# Patient Record
Sex: Female | Born: 1940 | Race: White | Hispanic: No | Marital: Married | Smoking: Former smoker
Health system: Southern US, Community
[De-identification: ages and names within clinical notes are randomized; demographics above are authoritative.]

## PROBLEM LIST (undated history)

## (undated) DIAGNOSIS — R413 Other amnesia: Secondary | ICD-10-CM

## (undated) DIAGNOSIS — F32A Depression, unspecified: Secondary | ICD-10-CM

## (undated) DIAGNOSIS — Z9889 Other specified postprocedural states: Secondary | ICD-10-CM

## (undated) DIAGNOSIS — R51 Headache: Secondary | ICD-10-CM

## (undated) DIAGNOSIS — M2550 Pain in unspecified joint: Secondary | ICD-10-CM

## (undated) DIAGNOSIS — C189 Malignant neoplasm of colon, unspecified: Secondary | ICD-10-CM

## (undated) DIAGNOSIS — K219 Gastro-esophageal reflux disease without esophagitis: Secondary | ICD-10-CM

## (undated) DIAGNOSIS — Z9289 Personal history of other medical treatment: Secondary | ICD-10-CM

## (undated) DIAGNOSIS — Z86718 Personal history of other venous thrombosis and embolism: Secondary | ICD-10-CM

## (undated) DIAGNOSIS — F329 Major depressive disorder, single episode, unspecified: Secondary | ICD-10-CM

## (undated) DIAGNOSIS — Z860100 Personal history of colon polyps, unspecified: Secondary | ICD-10-CM

## (undated) DIAGNOSIS — I10 Essential (primary) hypertension: Secondary | ICD-10-CM

## (undated) DIAGNOSIS — Z87442 Personal history of urinary calculi: Secondary | ICD-10-CM

## (undated) DIAGNOSIS — K649 Unspecified hemorrhoids: Secondary | ICD-10-CM

## (undated) DIAGNOSIS — H269 Unspecified cataract: Secondary | ICD-10-CM

## (undated) DIAGNOSIS — M1990 Unspecified osteoarthritis, unspecified site: Secondary | ICD-10-CM

## (undated) DIAGNOSIS — D696 Thrombocytopenia, unspecified: Secondary | ICD-10-CM

## (undated) DIAGNOSIS — C679 Malignant neoplasm of bladder, unspecified: Secondary | ICD-10-CM

## (undated) DIAGNOSIS — D509 Iron deficiency anemia, unspecified: Secondary | ICD-10-CM

## (undated) DIAGNOSIS — Z8719 Personal history of other diseases of the digestive system: Secondary | ICD-10-CM

## (undated) DIAGNOSIS — K7460 Unspecified cirrhosis of liver: Secondary | ICD-10-CM

## (undated) HISTORY — PX: BLADDER SURGERY: SHX569

## (undated) HISTORY — DX: Essential (primary) hypertension: I10

## (undated) HISTORY — DX: Other amnesia: R41.3

## (undated) HISTORY — DX: Major depressive disorder, single episode, unspecified: F32.9

## (undated) HISTORY — DX: Unspecified cirrhosis of liver: K74.60

## (undated) HISTORY — DX: Iron deficiency anemia, unspecified: D50.9

## (undated) HISTORY — PX: ABDOMINAL HYSTERECTOMY: SHX81

## (undated) HISTORY — DX: Malignant neoplasm of colon, unspecified: C18.9

## (undated) HISTORY — DX: Malignant neoplasm of bladder, unspecified: C67.9

## (undated) HISTORY — PX: COLONOSCOPY: SHX174

## (undated) HISTORY — DX: Depression, unspecified: F32.A

## (undated) HISTORY — PX: APPENDECTOMY: SHX54

## (undated) HISTORY — DX: Thrombocytopenia, unspecified: D69.6

## (undated) HISTORY — PX: COLON RESECTION: SHX5231

## (undated) HISTORY — PX: CHOLECYSTECTOMY: SHX55

## (undated) HISTORY — DX: Unspecified osteoarthritis, unspecified site: M19.90

---

## 1998-06-28 ENCOUNTER — Encounter: Payer: Self-pay | Admitting: Emergency Medicine

## 1998-06-28 ENCOUNTER — Emergency Department (HOSPITAL_COMMUNITY): Admission: EM | Admit: 1998-06-28 | Discharge: 1998-06-28 | Payer: Self-pay | Admitting: Emergency Medicine

## 1998-07-03 ENCOUNTER — Ambulatory Visit (HOSPITAL_COMMUNITY): Admission: RE | Admit: 1998-07-03 | Discharge: 1998-07-03 | Payer: Self-pay | Admitting: General Surgery

## 1998-07-03 ENCOUNTER — Encounter: Payer: Self-pay | Admitting: General Surgery

## 1998-07-13 ENCOUNTER — Other Ambulatory Visit: Admission: RE | Admit: 1998-07-13 | Discharge: 1998-07-13 | Payer: Self-pay

## 1998-07-13 ENCOUNTER — Encounter: Payer: Self-pay | Admitting: General Surgery

## 1998-07-18 ENCOUNTER — Ambulatory Visit (HOSPITAL_COMMUNITY): Admission: RE | Admit: 1998-07-18 | Discharge: 1998-07-19 | Payer: Self-pay | Admitting: General Surgery

## 1999-09-23 DIAGNOSIS — C189 Malignant neoplasm of colon, unspecified: Secondary | ICD-10-CM

## 1999-09-23 HISTORY — PX: COLON SURGERY: SHX602

## 1999-09-23 HISTORY — DX: Malignant neoplasm of colon, unspecified: C18.9

## 1999-10-30 ENCOUNTER — Encounter: Payer: Self-pay | Admitting: Internal Medicine

## 1999-10-30 ENCOUNTER — Ambulatory Visit (HOSPITAL_COMMUNITY): Admission: RE | Admit: 1999-10-30 | Discharge: 1999-10-30 | Payer: Self-pay | Admitting: Internal Medicine

## 2000-01-08 ENCOUNTER — Ambulatory Visit (HOSPITAL_COMMUNITY): Admission: RE | Admit: 2000-01-08 | Discharge: 2000-01-08 | Payer: Self-pay | Admitting: Internal Medicine

## 2000-01-08 ENCOUNTER — Encounter: Payer: Self-pay | Admitting: Internal Medicine

## 2000-06-04 ENCOUNTER — Ambulatory Visit (HOSPITAL_COMMUNITY): Admission: RE | Admit: 2000-06-04 | Discharge: 2000-06-04 | Payer: Self-pay | Admitting: Gastroenterology

## 2000-06-04 ENCOUNTER — Encounter (INDEPENDENT_AMBULATORY_CARE_PROVIDER_SITE_OTHER): Payer: Self-pay | Admitting: *Deleted

## 2000-06-05 ENCOUNTER — Encounter: Payer: Self-pay | Admitting: Gastroenterology

## 2000-06-05 ENCOUNTER — Ambulatory Visit (HOSPITAL_COMMUNITY): Admission: RE | Admit: 2000-06-05 | Discharge: 2000-06-05 | Payer: Self-pay | Admitting: Gastroenterology

## 2000-06-10 ENCOUNTER — Encounter (INDEPENDENT_AMBULATORY_CARE_PROVIDER_SITE_OTHER): Payer: Self-pay | Admitting: Specialist

## 2000-06-10 ENCOUNTER — Ambulatory Visit (HOSPITAL_COMMUNITY): Admission: RE | Admit: 2000-06-10 | Discharge: 2000-06-10 | Payer: Self-pay | Admitting: Gastroenterology

## 2000-06-26 ENCOUNTER — Inpatient Hospital Stay (HOSPITAL_COMMUNITY): Admission: RE | Admit: 2000-06-26 | Discharge: 2000-07-23 | Payer: Self-pay | Admitting: General Surgery

## 2000-06-26 ENCOUNTER — Encounter (INDEPENDENT_AMBULATORY_CARE_PROVIDER_SITE_OTHER): Payer: Self-pay | Admitting: *Deleted

## 2000-07-05 ENCOUNTER — Encounter: Payer: Self-pay | Admitting: General Surgery

## 2000-07-15 ENCOUNTER — Encounter: Payer: Self-pay | Admitting: General Surgery

## 2000-07-16 ENCOUNTER — Encounter: Payer: Self-pay | Admitting: General Surgery

## 2000-07-17 ENCOUNTER — Encounter: Payer: Self-pay | Admitting: General Surgery

## 2000-07-19 ENCOUNTER — Encounter: Payer: Self-pay | Admitting: General Surgery

## 2000-09-02 ENCOUNTER — Encounter: Payer: Self-pay | Admitting: Internal Medicine

## 2000-09-02 ENCOUNTER — Ambulatory Visit (HOSPITAL_COMMUNITY): Admission: RE | Admit: 2000-09-02 | Discharge: 2000-09-02 | Payer: Self-pay | Admitting: Internal Medicine

## 2000-09-04 ENCOUNTER — Ambulatory Visit (HOSPITAL_COMMUNITY): Admission: RE | Admit: 2000-09-04 | Discharge: 2000-09-04 | Payer: Self-pay | Admitting: Internal Medicine

## 2000-09-04 ENCOUNTER — Encounter: Payer: Self-pay | Admitting: Internal Medicine

## 2000-10-13 ENCOUNTER — Encounter: Payer: Self-pay | Admitting: Internal Medicine

## 2000-10-13 ENCOUNTER — Ambulatory Visit: Admission: RE | Admit: 2000-10-13 | Discharge: 2000-10-13 | Payer: Self-pay | Admitting: Internal Medicine

## 2001-09-22 DIAGNOSIS — C679 Malignant neoplasm of bladder, unspecified: Secondary | ICD-10-CM

## 2001-09-22 HISTORY — DX: Malignant neoplasm of bladder, unspecified: C67.9

## 2002-06-07 ENCOUNTER — Ambulatory Visit (HOSPITAL_COMMUNITY): Admission: RE | Admit: 2002-06-07 | Discharge: 2002-06-07 | Payer: Self-pay | Admitting: Internal Medicine

## 2002-06-07 ENCOUNTER — Encounter: Payer: Self-pay | Admitting: Internal Medicine

## 2002-07-04 ENCOUNTER — Other Ambulatory Visit: Admission: RE | Admit: 2002-07-04 | Discharge: 2002-07-04 | Payer: Self-pay | Admitting: Obstetrics and Gynecology

## 2002-07-21 ENCOUNTER — Encounter: Payer: Self-pay | Admitting: Urology

## 2002-07-21 ENCOUNTER — Encounter (INDEPENDENT_AMBULATORY_CARE_PROVIDER_SITE_OTHER): Payer: Self-pay | Admitting: *Deleted

## 2002-07-21 ENCOUNTER — Observation Stay (HOSPITAL_COMMUNITY): Admission: RE | Admit: 2002-07-21 | Discharge: 2002-07-22 | Payer: Self-pay | Admitting: Urology

## 2002-07-26 ENCOUNTER — Encounter: Admission: RE | Admit: 2002-07-26 | Discharge: 2002-07-26 | Payer: Self-pay | Admitting: Urology

## 2002-07-26 ENCOUNTER — Encounter: Payer: Self-pay | Admitting: Urology

## 2002-08-16 ENCOUNTER — Ambulatory Visit (HOSPITAL_COMMUNITY): Admission: RE | Admit: 2002-08-16 | Discharge: 2002-08-16 | Payer: Self-pay | Admitting: Internal Medicine

## 2002-08-22 ENCOUNTER — Encounter: Payer: Self-pay | Admitting: Urology

## 2002-08-22 ENCOUNTER — Inpatient Hospital Stay (HOSPITAL_COMMUNITY): Admission: RE | Admit: 2002-08-22 | Discharge: 2002-09-01 | Payer: Self-pay | Admitting: Surgery

## 2002-08-22 ENCOUNTER — Encounter (INDEPENDENT_AMBULATORY_CARE_PROVIDER_SITE_OTHER): Payer: Self-pay | Admitting: Specialist

## 2002-08-24 ENCOUNTER — Encounter: Payer: Self-pay | Admitting: Internal Medicine

## 2002-09-02 ENCOUNTER — Encounter: Payer: Self-pay | Admitting: Urology

## 2002-09-02 ENCOUNTER — Inpatient Hospital Stay (HOSPITAL_COMMUNITY): Admission: EM | Admit: 2002-09-02 | Discharge: 2002-09-08 | Payer: Self-pay | Admitting: Emergency Medicine

## 2002-09-04 ENCOUNTER — Encounter: Payer: Self-pay | Admitting: General Surgery

## 2002-09-22 HISTORY — PX: COLON SURGERY: SHX602

## 2002-10-10 ENCOUNTER — Ambulatory Visit (HOSPITAL_BASED_OUTPATIENT_CLINIC_OR_DEPARTMENT_OTHER): Admission: RE | Admit: 2002-10-10 | Discharge: 2002-10-10 | Payer: Self-pay | Admitting: Surgery

## 2002-10-10 ENCOUNTER — Encounter: Payer: Self-pay | Admitting: Surgery

## 2002-12-20 ENCOUNTER — Inpatient Hospital Stay (HOSPITAL_COMMUNITY): Admission: EM | Admit: 2002-12-20 | Discharge: 2002-12-23 | Payer: Self-pay | Admitting: Hematology & Oncology

## 2003-01-12 ENCOUNTER — Ambulatory Visit (HOSPITAL_COMMUNITY): Admission: RE | Admit: 2003-01-12 | Discharge: 2003-01-12 | Payer: Self-pay | Admitting: Hematology & Oncology

## 2003-01-12 ENCOUNTER — Encounter: Payer: Self-pay | Admitting: Hematology & Oncology

## 2003-01-27 ENCOUNTER — Ambulatory Visit (HOSPITAL_BASED_OUTPATIENT_CLINIC_OR_DEPARTMENT_OTHER): Admission: RE | Admit: 2003-01-27 | Discharge: 2003-01-27 | Payer: Self-pay | Admitting: Surgery

## 2003-03-07 ENCOUNTER — Ambulatory Visit (HOSPITAL_COMMUNITY): Admission: RE | Admit: 2003-03-07 | Discharge: 2003-03-07 | Payer: Self-pay | Admitting: Hematology & Oncology

## 2003-03-07 ENCOUNTER — Encounter: Payer: Self-pay | Admitting: Hematology & Oncology

## 2003-05-02 ENCOUNTER — Encounter: Payer: Self-pay | Admitting: Hematology & Oncology

## 2003-05-02 ENCOUNTER — Ambulatory Visit (HOSPITAL_COMMUNITY): Admission: RE | Admit: 2003-05-02 | Discharge: 2003-05-02 | Payer: Self-pay | Admitting: Hematology & Oncology

## 2003-07-05 ENCOUNTER — Ambulatory Visit (HOSPITAL_COMMUNITY): Admission: RE | Admit: 2003-07-05 | Discharge: 2003-07-05 | Payer: Self-pay | Admitting: Hematology & Oncology

## 2003-07-05 ENCOUNTER — Encounter: Payer: Self-pay | Admitting: Hematology & Oncology

## 2003-09-06 ENCOUNTER — Ambulatory Visit (HOSPITAL_COMMUNITY): Admission: RE | Admit: 2003-09-06 | Discharge: 2003-09-06 | Payer: Self-pay | Admitting: Hematology & Oncology

## 2003-11-01 ENCOUNTER — Ambulatory Visit (HOSPITAL_COMMUNITY): Admission: RE | Admit: 2003-11-01 | Discharge: 2003-11-01 | Payer: Self-pay | Admitting: Internal Medicine

## 2003-12-18 ENCOUNTER — Ambulatory Visit (HOSPITAL_COMMUNITY): Admission: RE | Admit: 2003-12-18 | Discharge: 2003-12-18 | Payer: Self-pay | Admitting: Hematology & Oncology

## 2004-01-01 ENCOUNTER — Ambulatory Visit (HOSPITAL_COMMUNITY): Admission: RE | Admit: 2004-01-01 | Discharge: 2004-01-01 | Payer: Self-pay | Admitting: Hematology & Oncology

## 2004-01-09 ENCOUNTER — Inpatient Hospital Stay (HOSPITAL_COMMUNITY): Admission: EM | Admit: 2004-01-09 | Discharge: 2004-01-11 | Payer: Self-pay | Admitting: Emergency Medicine

## 2004-04-01 ENCOUNTER — Ambulatory Visit (HOSPITAL_COMMUNITY): Admission: RE | Admit: 2004-04-01 | Discharge: 2004-04-01 | Payer: Self-pay | Admitting: Hematology & Oncology

## 2004-05-11 ENCOUNTER — Emergency Department (HOSPITAL_COMMUNITY): Admission: EM | Admit: 2004-05-11 | Discharge: 2004-05-11 | Payer: Self-pay | Admitting: Emergency Medicine

## 2004-08-06 ENCOUNTER — Ambulatory Visit (HOSPITAL_COMMUNITY): Admission: RE | Admit: 2004-08-06 | Discharge: 2004-08-06 | Payer: Self-pay | Admitting: Hematology & Oncology

## 2004-08-14 ENCOUNTER — Ambulatory Visit (HOSPITAL_COMMUNITY): Admission: RE | Admit: 2004-08-14 | Discharge: 2004-08-14 | Payer: Self-pay | Admitting: Hematology & Oncology

## 2004-09-09 ENCOUNTER — Ambulatory Visit (HOSPITAL_COMMUNITY): Admission: RE | Admit: 2004-09-09 | Discharge: 2004-09-09 | Payer: Self-pay | Admitting: Gastroenterology

## 2004-09-30 ENCOUNTER — Encounter (INDEPENDENT_AMBULATORY_CARE_PROVIDER_SITE_OTHER): Payer: Self-pay | Admitting: *Deleted

## 2004-09-30 ENCOUNTER — Ambulatory Visit (HOSPITAL_COMMUNITY): Admission: RE | Admit: 2004-09-30 | Discharge: 2004-09-30 | Payer: Self-pay | Admitting: Gastroenterology

## 2004-10-02 ENCOUNTER — Ambulatory Visit: Payer: Self-pay | Admitting: Hematology & Oncology

## 2004-10-23 ENCOUNTER — Encounter (INDEPENDENT_AMBULATORY_CARE_PROVIDER_SITE_OTHER): Payer: Self-pay | Admitting: Specialist

## 2004-10-23 ENCOUNTER — Inpatient Hospital Stay (HOSPITAL_COMMUNITY): Admission: RE | Admit: 2004-10-23 | Discharge: 2004-11-04 | Payer: Self-pay | Admitting: Surgery

## 2004-12-26 ENCOUNTER — Ambulatory Visit: Payer: Self-pay | Admitting: Hematology & Oncology

## 2005-01-01 ENCOUNTER — Other Ambulatory Visit: Admission: RE | Admit: 2005-01-01 | Discharge: 2005-01-01 | Payer: Self-pay | Admitting: Gynecology

## 2005-02-12 ENCOUNTER — Ambulatory Visit: Payer: Self-pay | Admitting: Hematology & Oncology

## 2005-03-10 ENCOUNTER — Ambulatory Visit (HOSPITAL_COMMUNITY): Admission: RE | Admit: 2005-03-10 | Discharge: 2005-03-10 | Payer: Self-pay | Admitting: Surgery

## 2005-04-08 ENCOUNTER — Ambulatory Visit: Payer: Self-pay | Admitting: Hematology & Oncology

## 2005-05-27 ENCOUNTER — Ambulatory Visit: Payer: Self-pay | Admitting: Hematology & Oncology

## 2005-06-21 ENCOUNTER — Emergency Department (HOSPITAL_COMMUNITY): Admission: EM | Admit: 2005-06-21 | Discharge: 2005-06-21 | Payer: Self-pay | Admitting: *Deleted

## 2005-07-23 ENCOUNTER — Ambulatory Visit: Payer: Self-pay | Admitting: Hematology & Oncology

## 2005-08-22 ENCOUNTER — Ambulatory Visit (HOSPITAL_COMMUNITY): Admission: RE | Admit: 2005-08-22 | Discharge: 2005-08-22 | Payer: Self-pay | Admitting: Hematology & Oncology

## 2005-09-25 ENCOUNTER — Ambulatory Visit: Payer: Self-pay | Admitting: Hematology & Oncology

## 2005-11-28 ENCOUNTER — Ambulatory Visit: Payer: Self-pay | Admitting: Hematology & Oncology

## 2005-12-02 ENCOUNTER — Ambulatory Visit (HOSPITAL_COMMUNITY): Admission: RE | Admit: 2005-12-02 | Discharge: 2005-12-02 | Payer: Self-pay | Admitting: Hematology & Oncology

## 2006-02-15 ENCOUNTER — Ambulatory Visit: Payer: Self-pay | Admitting: Hematology & Oncology

## 2006-03-09 ENCOUNTER — Ambulatory Visit (HOSPITAL_COMMUNITY): Admission: RE | Admit: 2006-03-09 | Discharge: 2006-03-09 | Payer: Self-pay | Admitting: Hematology & Oncology

## 2006-03-16 LAB — CBC WITH DIFFERENTIAL/PLATELET
BASO%: 0.5 % (ref 0.0–2.0)
HCT: 36.3 % (ref 34.8–46.6)
LYMPH%: 30.9 % (ref 14.0–48.0)
MCH: 28.6 pg (ref 26.0–34.0)
MCHC: 33.8 g/dL (ref 32.0–36.0)
MCV: 84.7 fL (ref 81.0–101.0)
MONO%: 9.2 % (ref 0.0–13.0)
NEUT%: 57.1 % (ref 39.6–76.8)
Platelets: 115 10*3/uL — ABNORMAL LOW (ref 145–400)
RBC: 4.29 10*6/uL (ref 3.70–5.32)

## 2006-03-16 LAB — COMPREHENSIVE METABOLIC PANEL
Alkaline Phosphatase: 102 U/L (ref 39–117)
BUN: 13 mg/dL (ref 6–23)
Glucose, Bld: 100 mg/dL — ABNORMAL HIGH (ref 70–99)
Sodium: 142 mEq/L (ref 135–145)
Total Bilirubin: 1 mg/dL (ref 0.3–1.2)

## 2006-04-03 ENCOUNTER — Ambulatory Visit: Payer: Self-pay | Admitting: Hematology & Oncology

## 2006-06-08 ENCOUNTER — Ambulatory Visit: Payer: Self-pay | Admitting: Hematology & Oncology

## 2006-06-25 ENCOUNTER — Ambulatory Visit (HOSPITAL_COMMUNITY): Admission: RE | Admit: 2006-06-25 | Discharge: 2006-06-25 | Payer: Self-pay | Admitting: Hematology & Oncology

## 2006-07-22 ENCOUNTER — Ambulatory Visit: Payer: Self-pay | Admitting: Hematology & Oncology

## 2006-08-12 ENCOUNTER — Ambulatory Visit (HOSPITAL_COMMUNITY): Admission: RE | Admit: 2006-08-12 | Discharge: 2006-08-12 | Payer: Self-pay | Admitting: Gynecology

## 2006-08-18 ENCOUNTER — Other Ambulatory Visit: Admission: RE | Admit: 2006-08-18 | Discharge: 2006-08-18 | Payer: Self-pay | Admitting: Gynecology

## 2006-09-10 ENCOUNTER — Ambulatory Visit (HOSPITAL_BASED_OUTPATIENT_CLINIC_OR_DEPARTMENT_OTHER): Admission: RE | Admit: 2006-09-10 | Discharge: 2006-09-10 | Payer: Self-pay | Admitting: Surgery

## 2006-09-25 ENCOUNTER — Inpatient Hospital Stay (HOSPITAL_COMMUNITY): Admission: EM | Admit: 2006-09-25 | Discharge: 2006-09-26 | Payer: Self-pay | Admitting: Emergency Medicine

## 2006-12-02 ENCOUNTER — Ambulatory Visit: Payer: Self-pay | Admitting: Hematology & Oncology

## 2006-12-07 LAB — CBC WITH DIFFERENTIAL/PLATELET
Eosinophils Absolute: 0.1 10*3/uL (ref 0.0–0.5)
HCT: 34.6 % — ABNORMAL LOW (ref 34.8–46.6)
LYMPH%: 28 % (ref 14.0–48.0)
MONO#: 0.4 10*3/uL (ref 0.1–0.9)
NEUT#: 2.6 10*3/uL (ref 1.5–6.5)
NEUT%: 61.1 % (ref 39.6–76.8)
Platelets: 125 10*3/uL — ABNORMAL LOW (ref 145–400)
WBC: 4.2 10*3/uL (ref 3.9–10.0)

## 2006-12-07 LAB — COMPREHENSIVE METABOLIC PANEL
CO2: 27 mEq/L (ref 19–32)
Creatinine, Ser: 0.74 mg/dL (ref 0.40–1.20)
Glucose, Bld: 122 mg/dL — ABNORMAL HIGH (ref 70–99)
Total Bilirubin: 0.7 mg/dL (ref 0.3–1.2)
Total Protein: 6.4 g/dL (ref 6.0–8.3)

## 2006-12-07 LAB — CEA: CEA: 1.6 ng/mL (ref 0.0–5.0)

## 2006-12-09 ENCOUNTER — Ambulatory Visit (HOSPITAL_COMMUNITY): Admission: RE | Admit: 2006-12-09 | Discharge: 2006-12-09 | Payer: Self-pay | Admitting: Hematology & Oncology

## 2006-12-23 LAB — COMPREHENSIVE METABOLIC PANEL
ALT: 17 U/L (ref 0–35)
Albumin: 4 g/dL (ref 3.5–5.2)
CO2: 27 mEq/L (ref 19–32)
Calcium: 8.7 mg/dL (ref 8.4–10.5)
Chloride: 104 mEq/L (ref 96–112)
Sodium: 140 mEq/L (ref 135–145)
Total Protein: 6.4 g/dL (ref 6.0–8.3)

## 2006-12-23 LAB — CBC WITH DIFFERENTIAL/PLATELET
BASO%: 0.4 % (ref 0.0–2.0)
HCT: 34.3 % — ABNORMAL LOW (ref 34.8–46.6)
HGB: 11.6 g/dL (ref 11.6–15.9)
MCHC: 33.8 g/dL (ref 32.0–36.0)
MONO#: 0.4 10*3/uL (ref 0.1–0.9)
NEUT%: 66.5 % (ref 39.6–76.8)
RDW: 16.4 % — ABNORMAL HIGH (ref 11.3–14.5)
WBC: 4.4 10*3/uL (ref 3.9–10.0)
lymph#: 1 10*3/uL (ref 0.9–3.3)

## 2006-12-23 LAB — CEA: CEA: 1.7 ng/mL (ref 0.0–5.0)

## 2007-04-14 ENCOUNTER — Ambulatory Visit: Payer: Self-pay | Admitting: Hematology & Oncology

## 2007-05-10 ENCOUNTER — Ambulatory Visit (HOSPITAL_COMMUNITY): Admission: RE | Admit: 2007-05-10 | Discharge: 2007-05-10 | Payer: Self-pay | Admitting: Hematology & Oncology

## 2007-05-10 LAB — CBC WITH DIFFERENTIAL/PLATELET
BASO%: 0.7 % (ref 0.0–2.0)
LYMPH%: 23.5 % (ref 14.0–48.0)
MCHC: 34.1 g/dL (ref 32.0–36.0)
MONO#: 0.4 10*3/uL (ref 0.1–0.9)
RBC: 4.18 10*6/uL (ref 3.70–5.32)
RDW: 16.4 % — ABNORMAL HIGH (ref 11.3–14.5)
WBC: 3.9 10*3/uL (ref 3.9–10.0)
lymph#: 0.9 10*3/uL (ref 0.9–3.3)

## 2007-05-10 LAB — CEA: CEA: 0.8 ng/mL (ref 0.0–5.0)

## 2007-05-10 LAB — COMPREHENSIVE METABOLIC PANEL
ALT: 20 U/L (ref 0–35)
CO2: 32 mEq/L (ref 19–32)
Chloride: 104 mEq/L (ref 96–112)
Sodium: 140 mEq/L (ref 135–145)
Total Bilirubin: 0.5 mg/dL (ref 0.3–1.2)
Total Protein: 6.3 g/dL (ref 6.0–8.3)

## 2007-08-31 ENCOUNTER — Ambulatory Visit: Payer: Self-pay | Admitting: Hematology & Oncology

## 2007-09-03 LAB — COMPREHENSIVE METABOLIC PANEL
AST: 24 U/L (ref 0–37)
Albumin: 3.3 g/dL — ABNORMAL LOW (ref 3.5–5.2)
BUN: 16 mg/dL (ref 6–23)
CO2: 27 mEq/L (ref 19–32)
Calcium: 8.9 mg/dL (ref 8.4–10.5)
Chloride: 108 mEq/L (ref 96–112)
Glucose, Bld: 136 mg/dL — ABNORMAL HIGH (ref 70–99)
Potassium: 3.2 mEq/L — ABNORMAL LOW (ref 3.5–5.3)

## 2007-09-03 LAB — CEA: CEA: 1 ng/mL (ref 0.0–5.0)

## 2007-09-03 LAB — CBC WITH DIFFERENTIAL/PLATELET
MCH: 27.6 pg (ref 26.0–34.0)
MCHC: 34.5 g/dL (ref 32.0–36.0)
MCV: 80.1 fL — ABNORMAL LOW (ref 81.0–101.0)
NEUT%: 66.8 % (ref 39.6–76.8)
Platelets: 96 10*3/uL — ABNORMAL LOW (ref 145–400)
RBC: 4.06 10*6/uL (ref 3.70–5.32)
WBC: 3.4 10*3/uL — ABNORMAL LOW (ref 3.9–10.0)

## 2007-09-06 ENCOUNTER — Ambulatory Visit (HOSPITAL_COMMUNITY): Admission: RE | Admit: 2007-09-06 | Discharge: 2007-09-06 | Payer: Self-pay | Admitting: Hematology & Oncology

## 2008-01-11 ENCOUNTER — Ambulatory Visit: Payer: Self-pay | Admitting: Hematology & Oncology

## 2008-01-13 ENCOUNTER — Ambulatory Visit (HOSPITAL_COMMUNITY): Admission: RE | Admit: 2008-01-13 | Discharge: 2008-01-13 | Payer: Self-pay | Admitting: Hematology & Oncology

## 2008-01-13 LAB — COMPREHENSIVE METABOLIC PANEL
ALT: 25 U/L (ref 0–35)
AST: 26 U/L (ref 0–37)
Albumin: 3.4 g/dL — ABNORMAL LOW (ref 3.5–5.2)
Alkaline Phosphatase: 103 U/L (ref 39–117)
Glucose, Bld: 102 mg/dL — ABNORMAL HIGH (ref 70–99)
Potassium: 4.2 mEq/L (ref 3.5–5.3)
Sodium: 142 mEq/L (ref 135–145)
Total Bilirubin: 1 mg/dL (ref 0.3–1.2)
Total Protein: 6.1 g/dL (ref 6.0–8.3)

## 2008-01-13 LAB — CBC WITH DIFFERENTIAL/PLATELET
BASO%: 2.3 % — ABNORMAL HIGH (ref 0.0–2.0)
EOS%: 2.5 % (ref 0.0–7.0)
Eosinophils Absolute: 0.1 10*3/uL (ref 0.0–0.5)
MCHC: 34.1 g/dL (ref 32.0–36.0)
MCV: 79.8 fL — ABNORMAL LOW (ref 81.0–101.0)
MONO%: 9.4 % (ref 0.0–13.0)
NEUT#: 2.5 10*3/uL (ref 1.5–6.5)
RBC: 4.14 10*6/uL (ref 3.70–5.32)
RDW: 16.5 % — ABNORMAL HIGH (ref 11.3–14.5)

## 2008-02-02 ENCOUNTER — Inpatient Hospital Stay (HOSPITAL_COMMUNITY): Admission: EM | Admit: 2008-02-02 | Discharge: 2008-02-04 | Payer: Self-pay | Admitting: Emergency Medicine

## 2008-06-13 ENCOUNTER — Encounter: Admission: RE | Admit: 2008-06-13 | Discharge: 2008-06-13 | Payer: Self-pay | Admitting: Gynecology

## 2008-06-22 ENCOUNTER — Ambulatory Visit: Payer: Self-pay | Admitting: Hematology & Oncology

## 2008-06-26 LAB — CBC WITH DIFFERENTIAL/PLATELET
BASO%: 0.3 % (ref 0.0–2.0)
Basophils Absolute: 0 10*3/uL (ref 0.0–0.1)
HCT: 32.8 % — ABNORMAL LOW (ref 34.8–46.6)
HGB: 11 g/dL — ABNORMAL LOW (ref 11.6–15.9)
MONO#: 0.4 10*3/uL (ref 0.1–0.9)
NEUT%: 61.9 % (ref 39.6–76.8)
WBC: 3.5 10*3/uL — ABNORMAL LOW (ref 3.9–10.0)
lymph#: 0.9 10*3/uL (ref 0.9–3.3)

## 2008-06-27 LAB — COMPREHENSIVE METABOLIC PANEL
ALT: 16 U/L (ref 0–35)
Albumin: 4 g/dL (ref 3.5–5.2)
BUN: 11 mg/dL (ref 6–23)
CO2: 29 mEq/L (ref 19–32)
Calcium: 9.6 mg/dL (ref 8.4–10.5)
Chloride: 105 mEq/L (ref 96–112)
Creatinine, Ser: 0.7 mg/dL (ref 0.40–1.20)

## 2008-06-28 ENCOUNTER — Ambulatory Visit (HOSPITAL_COMMUNITY): Admission: RE | Admit: 2008-06-28 | Discharge: 2008-06-28 | Payer: Self-pay | Admitting: Hematology & Oncology

## 2008-06-30 ENCOUNTER — Ambulatory Visit: Payer: Self-pay | Admitting: Hematology & Oncology

## 2008-09-22 HISTORY — PX: TOTAL KNEE ARTHROPLASTY: SHX125

## 2008-12-22 ENCOUNTER — Ambulatory Visit: Payer: Self-pay | Admitting: Hematology & Oncology

## 2009-01-04 ENCOUNTER — Ambulatory Visit: Payer: Self-pay | Admitting: Diagnostic Radiology

## 2009-01-04 ENCOUNTER — Ambulatory Visit (HOSPITAL_BASED_OUTPATIENT_CLINIC_OR_DEPARTMENT_OTHER): Admission: RE | Admit: 2009-01-04 | Discharge: 2009-01-04 | Payer: Self-pay | Admitting: Hematology & Oncology

## 2009-01-04 LAB — CBC WITH DIFFERENTIAL (CANCER CENTER ONLY)
Eosinophils Absolute: 0.1 10*3/uL (ref 0.0–0.5)
MCV: 83 fL (ref 81–101)
MONO#: 0.3 10*3/uL (ref 0.1–0.9)
NEUT#: 2.7 10*3/uL (ref 1.5–6.5)
Platelets: 90 10*3/uL — ABNORMAL LOW (ref 145–400)
RBC: 4.41 10*6/uL (ref 3.70–5.32)
WBC: 4 10*3/uL (ref 3.9–10.0)

## 2009-01-04 LAB — CMP (CANCER CENTER ONLY)
BUN, Bld: 12 mg/dL (ref 7–22)
CO2: 30 mEq/L (ref 18–33)
Calcium: 9 mg/dL (ref 8.0–10.3)
Chloride: 102 mEq/L (ref 98–108)
Creat: 0.7 mg/dl (ref 0.6–1.2)

## 2009-01-04 LAB — CEA: CEA: 1.9 ng/mL (ref 0.0–5.0)

## 2009-04-24 ENCOUNTER — Ambulatory Visit: Payer: Self-pay | Admitting: Hematology & Oncology

## 2009-06-29 ENCOUNTER — Ambulatory Visit: Payer: Self-pay | Admitting: Hematology & Oncology

## 2009-07-02 ENCOUNTER — Ambulatory Visit (HOSPITAL_BASED_OUTPATIENT_CLINIC_OR_DEPARTMENT_OTHER): Admission: RE | Admit: 2009-07-02 | Discharge: 2009-07-02 | Payer: Self-pay | Admitting: Hematology & Oncology

## 2009-07-02 ENCOUNTER — Ambulatory Visit: Payer: Self-pay | Admitting: Diagnostic Radiology

## 2009-07-02 LAB — CMP (CANCER CENTER ONLY)
Albumin: 3.4 g/dL (ref 3.3–5.5)
Alkaline Phosphatase: 82 U/L (ref 26–84)
BUN, Bld: 14 mg/dL (ref 7–22)
Calcium: 9.5 mg/dL (ref 8.0–10.3)
Glucose, Bld: 89 mg/dL (ref 73–118)
Potassium: 4.4 mEq/L (ref 3.3–4.7)

## 2009-07-02 LAB — CBC WITH DIFFERENTIAL (CANCER CENTER ONLY)
Eosinophils Absolute: 0.1 10*3/uL (ref 0.0–0.5)
LYMPH#: 1 10*3/uL (ref 0.9–3.3)
MCH: 28.7 pg (ref 26.0–34.0)
MONO%: 7.8 % (ref 0.0–13.0)
NEUT#: 2.4 10*3/uL (ref 1.5–6.5)
Platelets: 74 10*3/uL — ABNORMAL LOW (ref 145–400)
RBC: 4.21 10*6/uL (ref 3.70–5.32)
WBC: 3.9 10*3/uL (ref 3.9–10.0)

## 2009-12-27 ENCOUNTER — Ambulatory Visit: Payer: Self-pay | Admitting: Hematology & Oncology

## 2009-12-31 ENCOUNTER — Ambulatory Visit: Payer: Self-pay | Admitting: Diagnostic Radiology

## 2009-12-31 ENCOUNTER — Ambulatory Visit (HOSPITAL_BASED_OUTPATIENT_CLINIC_OR_DEPARTMENT_OTHER): Admission: RE | Admit: 2009-12-31 | Discharge: 2009-12-31 | Payer: Self-pay | Admitting: Hematology & Oncology

## 2009-12-31 LAB — CBC WITH DIFFERENTIAL (CANCER CENTER ONLY)
BASO%: 0.4 % (ref 0.0–2.0)
EOS%: 1.7 % (ref 0.0–7.0)
MCH: 28.5 pg (ref 26.0–34.0)
MCHC: 32.9 g/dL (ref 32.0–36.0)
MONO%: 7.7 % (ref 0.0–13.0)
NEUT#: 2.5 10*3/uL (ref 1.5–6.5)
Platelets: 82 10*3/uL — ABNORMAL LOW (ref 145–400)
RDW: 14.6 % (ref 10.5–14.6)

## 2009-12-31 LAB — CMP (CANCER CENTER ONLY)
AST: 27 U/L (ref 11–38)
Albumin: 3.2 g/dL — ABNORMAL LOW (ref 3.3–5.5)
Alkaline Phosphatase: 78 U/L (ref 26–84)
BUN, Bld: 13 mg/dL (ref 7–22)
Potassium: 3.4 mEq/L (ref 3.3–4.7)
Sodium: 138 mEq/L (ref 128–145)
Total Protein: 6.3 g/dL — ABNORMAL LOW (ref 6.4–8.1)

## 2010-02-20 ENCOUNTER — Inpatient Hospital Stay (HOSPITAL_COMMUNITY): Admission: RE | Admit: 2010-02-20 | Discharge: 2010-02-24 | Payer: Self-pay | Admitting: Orthopedic Surgery

## 2010-06-26 ENCOUNTER — Ambulatory Visit: Payer: Self-pay | Admitting: Hematology & Oncology

## 2010-07-01 ENCOUNTER — Ambulatory Visit (HOSPITAL_BASED_OUTPATIENT_CLINIC_OR_DEPARTMENT_OTHER): Admission: RE | Admit: 2010-07-01 | Discharge: 2010-07-01 | Payer: Self-pay | Admitting: Hematology & Oncology

## 2010-07-01 ENCOUNTER — Ambulatory Visit: Payer: Self-pay | Admitting: Diagnostic Radiology

## 2010-07-01 LAB — CBC WITH DIFFERENTIAL (CANCER CENTER ONLY)
BASO#: 0 10*3/uL (ref 0.0–0.2)
Eosinophils Absolute: 0.1 10*3/uL (ref 0.0–0.5)
HCT: 36.7 % (ref 34.8–46.6)
HGB: 11.9 g/dL (ref 11.6–15.9)
MCH: 26.9 pg (ref 26.0–34.0)
MONO%: 8.3 % (ref 0.0–13.0)
NEUT#: 2.7 10*3/uL (ref 1.5–6.5)
NEUT%: 62.5 % (ref 39.6–80.0)
RBC: 4.42 10*6/uL (ref 3.70–5.32)

## 2010-07-01 LAB — CMP (CANCER CENTER ONLY)
AST: 24 U/L (ref 11–38)
Albumin: 3.3 g/dL (ref 3.3–5.5)
Alkaline Phosphatase: 87 U/L — ABNORMAL HIGH (ref 26–84)
BUN, Bld: 13 mg/dL (ref 7–22)
Creat: 0.7 mg/dl (ref 0.6–1.2)
Glucose, Bld: 105 mg/dL (ref 73–118)
Potassium: 4.2 mEq/L (ref 3.3–4.7)
Total Bilirubin: 1.6 mg/dl (ref 0.20–1.60)

## 2010-07-01 LAB — CEA: CEA: 2.2 ng/mL (ref 0.0–5.0)

## 2010-09-07 ENCOUNTER — Emergency Department (HOSPITAL_COMMUNITY)
Admission: EM | Admit: 2010-09-07 | Discharge: 2010-09-07 | Payer: Self-pay | Source: Home / Self Care | Admitting: Emergency Medicine

## 2010-10-12 ENCOUNTER — Encounter: Payer: Self-pay | Admitting: Hematology & Oncology

## 2010-10-13 ENCOUNTER — Encounter: Payer: Self-pay | Admitting: Hematology & Oncology

## 2010-11-12 ENCOUNTER — Emergency Department (HOSPITAL_COMMUNITY)
Admission: EM | Admit: 2010-11-12 | Discharge: 2010-11-13 | Disposition: A | Discharge status: Home / Self Care | Payer: Medicare Other | Attending: Emergency Medicine | Admitting: Emergency Medicine

## 2010-11-12 ENCOUNTER — Emergency Department (HOSPITAL_COMMUNITY): Payer: Medicare Other

## 2010-11-12 DIAGNOSIS — R109 Unspecified abdominal pain: Secondary | ICD-10-CM | POA: Insufficient documentation

## 2010-11-12 DIAGNOSIS — M545 Low back pain, unspecified: Secondary | ICD-10-CM | POA: Insufficient documentation

## 2010-11-12 DIAGNOSIS — Z79899 Other long term (current) drug therapy: Secondary | ICD-10-CM | POA: Insufficient documentation

## 2010-11-12 DIAGNOSIS — F3289 Other specified depressive episodes: Secondary | ICD-10-CM | POA: Insufficient documentation

## 2010-11-12 DIAGNOSIS — F0390 Unspecified dementia without behavioral disturbance: Secondary | ICD-10-CM | POA: Insufficient documentation

## 2010-11-12 DIAGNOSIS — Z936 Other artificial openings of urinary tract status: Secondary | ICD-10-CM | POA: Insufficient documentation

## 2010-11-12 DIAGNOSIS — Z9889 Other specified postprocedural states: Secondary | ICD-10-CM | POA: Insufficient documentation

## 2010-11-12 DIAGNOSIS — D696 Thrombocytopenia, unspecified: Secondary | ICD-10-CM | POA: Insufficient documentation

## 2010-11-12 DIAGNOSIS — F329 Major depressive disorder, single episode, unspecified: Secondary | ICD-10-CM | POA: Insufficient documentation

## 2010-11-12 DIAGNOSIS — Z8551 Personal history of malignant neoplasm of bladder: Secondary | ICD-10-CM | POA: Insufficient documentation

## 2010-11-12 DIAGNOSIS — I10 Essential (primary) hypertension: Secondary | ICD-10-CM | POA: Insufficient documentation

## 2010-11-12 DIAGNOSIS — N218 Other lower urinary tract calculus: Secondary | ICD-10-CM | POA: Insufficient documentation

## 2010-11-12 DIAGNOSIS — N201 Calculus of ureter: Secondary | ICD-10-CM | POA: Insufficient documentation

## 2010-11-12 LAB — CBC
HCT: 36.1 % (ref 36.0–46.0)
MCHC: 32.1 g/dL (ref 30.0–36.0)
Platelets: 74 10*3/uL — ABNORMAL LOW (ref 150–400)
RDW: 17.3 % — ABNORMAL HIGH (ref 11.5–15.5)
WBC: 3 10*3/uL — ABNORMAL LOW (ref 4.0–10.5)

## 2010-11-12 LAB — URINALYSIS, ROUTINE W REFLEX MICROSCOPIC
Bilirubin Urine: NEGATIVE
Nitrite: NEGATIVE
Specific Gravity, Urine: 1.015 (ref 1.005–1.030)
Urine Glucose, Fasting: NEGATIVE mg/dL
pH: 8 (ref 5.0–8.0)

## 2010-11-12 LAB — DIFFERENTIAL
Basophils Relative: 0 % (ref 0–1)
Eosinophils Relative: 1 % (ref 0–5)
Monocytes Absolute: 0.4 10*3/uL (ref 0.1–1.0)
Neutrophils Relative %: 64 % (ref 43–77)

## 2010-11-12 LAB — URINE MICROSCOPIC-ADD ON

## 2010-11-12 LAB — COMPREHENSIVE METABOLIC PANEL
ALT: 21 U/L (ref 0–35)
Albumin: 3.3 g/dL — ABNORMAL LOW (ref 3.5–5.2)
Alkaline Phosphatase: 91 U/L (ref 39–117)
Calcium: 9.5 mg/dL (ref 8.4–10.5)
Glucose, Bld: 109 mg/dL — ABNORMAL HIGH (ref 70–99)
Potassium: 3.5 mEq/L (ref 3.5–5.1)
Sodium: 141 mEq/L (ref 135–145)
Total Protein: 6.5 g/dL (ref 6.0–8.3)

## 2010-11-15 ENCOUNTER — Ambulatory Visit (INDEPENDENT_AMBULATORY_CARE_PROVIDER_SITE_OTHER): Payer: Medicare Other | Admitting: Urology

## 2010-11-15 DIAGNOSIS — N219 Calculus of lower urinary tract, unspecified: Secondary | ICD-10-CM

## 2010-11-15 DIAGNOSIS — R1031 Right lower quadrant pain: Secondary | ICD-10-CM

## 2010-11-15 LAB — URINE CULTURE
Colony Count: >=100000
Culture  Setup Time: 201202220148

## 2010-11-18 ENCOUNTER — Other Ambulatory Visit: Payer: Self-pay | Admitting: Urology

## 2010-11-18 ENCOUNTER — Encounter (HOSPITAL_COMMUNITY): Payer: Medicare Other

## 2010-11-18 DIAGNOSIS — Z01812 Encounter for preprocedural laboratory examination: Secondary | ICD-10-CM | POA: Insufficient documentation

## 2010-11-18 DIAGNOSIS — N218 Other lower urinary tract calculus: Secondary | ICD-10-CM | POA: Insufficient documentation

## 2010-11-18 DIAGNOSIS — Z9889 Other specified postprocedural states: Secondary | ICD-10-CM | POA: Insufficient documentation

## 2010-11-18 LAB — COMPREHENSIVE METABOLIC PANEL
BUN: 11 mg/dL (ref 6–23)
CO2: 30 mEq/L (ref 19–32)
Calcium: 9 mg/dL (ref 8.4–10.5)
Chloride: 105 mEq/L (ref 96–112)
Creatinine, Ser: 0.77 mg/dL (ref 0.4–1.2)
GFR calc Af Amer: >60 mL/min (ref >60)
GFR calc non Af Amer: >60 mL/min (ref >60)
Total Bilirubin: 0.8 mg/dL (ref 0.3–1.2)

## 2010-11-18 LAB — SURGICAL PCR SCREEN
MRSA, PCR: NEGATIVE
Staphylococcus aureus: NEGATIVE

## 2010-11-18 LAB — CBC
HCT: 34.5 % — ABNORMAL LOW (ref 36.0–46.0)
Hemoglobin: 11 g/dL — ABNORMAL LOW (ref 12.0–15.0)
RBC: 4.09 MIL/uL (ref 3.87–5.11)
WBC: 3.4 10*3/uL — ABNORMAL LOW (ref 4.0–10.5)

## 2010-11-19 ENCOUNTER — Ambulatory Visit (HOSPITAL_COMMUNITY)
Admission: RE | Admit: 2010-11-19 | Discharge: 2010-11-19 | Disposition: A | Discharge status: Home / Self Care | Payer: Medicare Other | Source: Ambulatory Visit | Attending: Urology | Admitting: Urology

## 2010-11-19 ENCOUNTER — Other Ambulatory Visit: Payer: Self-pay | Admitting: Urology

## 2010-11-19 DIAGNOSIS — Y833 Surgical operation with formation of external stoma as the cause of abnormal reaction of the patient, or of later complication, without mention of misadventure at the time of the procedure: Secondary | ICD-10-CM | POA: Insufficient documentation

## 2010-11-19 DIAGNOSIS — IMO0002 Reserved for concepts with insufficient information to code with codable children: Secondary | ICD-10-CM | POA: Insufficient documentation

## 2010-11-19 DIAGNOSIS — Z79899 Other long term (current) drug therapy: Secondary | ICD-10-CM | POA: Insufficient documentation

## 2010-11-19 DIAGNOSIS — N9989 Other postprocedural complications and disorders of genitourinary system: Secondary | ICD-10-CM | POA: Insufficient documentation

## 2010-11-19 DIAGNOSIS — Z01812 Encounter for preprocedural laboratory examination: Secondary | ICD-10-CM | POA: Insufficient documentation

## 2010-11-28 ENCOUNTER — Other Ambulatory Visit (HOSPITAL_COMMUNITY)
Admission: RE | Admit: 2010-11-28 | Discharge: 2010-11-28 | Disposition: A | Discharge status: Home / Self Care | Payer: Medicare Other | Source: Ambulatory Visit | Attending: Urology | Admitting: Urology

## 2010-11-29 NOTE — Op Note (Signed)
[name], [name]             ACCOUNT NO.:  [id number]  MEDICAL RECORD NO.:  [id number]           PATIENT TYPE:  O  LOCATION:  DAYL                         FACILITY:  [facility]  PHYSICIAN:  Marshall Cork. Jeffie Pollock, M.D.    DATE OF BIRTH:  1940/12/28  DATE OF PROCEDURE:  11/19/2010 DATE OF DISCHARGE:                              OPERATIVE REPORT   PROCEDURE:  Endoscopic lithotripsy of multiple stones in the ileal conduit, largest 3 cm  PREOPERATIVE DIAGNOSIS:  Multiple stones in the ileal conduit.  POSTOPERATIVE DIAGNOSIS:  Multiple stones in the ileal conduit.  SURGEON:  Marshall Cork. Jeffie Pollock, M.D.  ANESTHESIA:  General.  SPECIMENS:  Stone fragments.  DRAINS:  A 22-French red rubber catheter in the conduit.  BLOOD LOSS:  None.  COMPLICATIONS:  None.  INDICATIONS:  Ms. Kontos is a 70 year old white female with a history of bladder cancer with prior cystectomy and ileal conduit urinary diversion.  She presented recently with some right back and abdominal pain and studies demonstrated approximately 3-cm stone in the base of the ileal conduit.  It appeared that there was more than one stone in place.  After reviewing the options, we have decided to proceed with endoscopic lithotripsy and removal of the stones.  FINDINGS OF PROCEDURE:  She had been on Cipro preoperatively and was given Unasyn to broaden her coverage.  She was taken to the operating room where she was fitted with PAS hose.  A general anesthetic was induced and she was left in the supine position.  Her peristomal area in the right lower quadrant was prepped with Betadine solution and she was draped in usual sterile fashion.  A time-out was performed.  Initially, flexible endoscopy of the conduit was obtained to assess the anatomy.  This demonstrated minimal tortuosity and collection of stones in the body of the loop, the largest measuring approximately 3 cm.  They appeared mobile within this portion of the loop.  The  ureteral orifices were noted and were widely patent.  After initial endoscopy, a rigid 24-French nephroscope was then passed under visual guidance into the conduit.  The stone was identified and was engaged with a Electrical engineer combination ultrasonic device and the stones were fragmented into many manageable pieces but were harder than I had anticipated to break.  Once the stone had been fragmented, the fragments were removed with combination of three-prong and grasper and alligator forceps.  An attempt was made to remove all visible fragments.  At the end, the only thing that remained was a small amount of dust that was too small to remove with the graspers.  I did note one staple within the stone and that was removed as well.  It appeared to be unattached to the body of the balloon.  No other exposed staples were identified.  Once all of the stone fragments had been removed, the conduit was inspected to ensure no injuries had occurred.  The only area of concern was in the portion of the loop as it traverse the abdominal wall where there was about a 2-cm mucosal tear but it did not appear to be full-thickness through  the loop, however, it was felt that stenting of the conduit was indicated.  At this point, a 22-French red rubber catheter was advanced into the bud of the loop and trimmed.  A 3-0 nylon was placed into the loop.  It was not secured to the skin but left as a lone tether to aid identification and removal.  At this point, the patient's drapes were removed and a fresh urostomy bag was placed.  The anesthetic was reversed and she was moved to the recovery room in stable condition.  There were no complications.     Marshall Cork. Jeffie Pollock, M.D.     JJW/MEDQ  D:  11/19/2010  T:  11/19/2010  Job:  [id number]  cc:   Tammy R. Modena Morrow, M.D. Fax: [place]. [name], M.D. Fax: [phone]  Electronically Signed by Irine Seal M.D. on 11/28/2010 12:58:33 PM

## 2010-12-02 LAB — STONE ANALYSIS
Stone Weight KSTONE: 1.111 g
Stone Weight KSTONE: 0.934 g

## 2010-12-09 LAB — HEMOGLOBIN AND HEMATOCRIT, BLOOD: Hemoglobin: 8.9 g/dL — ABNORMAL LOW (ref 12.0–15.0)

## 2010-12-09 LAB — CBC
HCT: 24 % — ABNORMAL LOW (ref 36.0–46.0)
HCT: 26.6 % — ABNORMAL LOW (ref 36.0–46.0)
HCT: 29.9 % — ABNORMAL LOW (ref 36.0–46.0)
Hemoglobin: 8.1 g/dL — ABNORMAL LOW (ref 12.0–15.0)
Hemoglobin: 8.7 g/dL — ABNORMAL LOW (ref 12.0–15.0)
MCHC: 33.6 g/dL (ref 30.0–36.0)
MCV: 87.6 fL (ref 78.0–100.0)
Platelets: 100 10*3/uL — ABNORMAL LOW (ref 150–400)
Platelets: 67 10*3/uL — ABNORMAL LOW (ref 150–400)
Platelets: 92 10*3/uL — ABNORMAL LOW (ref 150–400)
RBC: 2.93 MIL/uL — ABNORMAL LOW (ref 3.87–5.11)
RBC: 3.01 MIL/uL — ABNORMAL LOW (ref 3.87–5.11)
RDW: 15.1 % (ref 11.5–15.5)
RDW: 15.8 % — ABNORMAL HIGH (ref 11.5–15.5)
RDW: 15.9 % — ABNORMAL HIGH (ref 11.5–15.5)
WBC: 4.4 10*3/uL (ref 4.0–10.5)
WBC: 4.9 10*3/uL (ref 4.0–10.5)

## 2010-12-09 LAB — URINE MICROSCOPIC-ADD ON

## 2010-12-09 LAB — BASIC METABOLIC PANEL
BUN: 11 mg/dL (ref 6–23)
BUN: 12 mg/dL (ref 6–23)
CO2: 30 mEq/L (ref 19–32)
Calcium: 8.2 mg/dL — ABNORMAL LOW (ref 8.4–10.5)
Calcium: 8.6 mg/dL (ref 8.4–10.5)
Chloride: 106 mEq/L (ref 96–112)
Creatinine, Ser: 0.95 mg/dL (ref 0.4–1.2)
GFR calc Af Amer: >60 mL/min (ref >60)
GFR calc Af Amer: >60 mL/min (ref >60)
GFR calc non Af Amer: 58 mL/min — ABNORMAL LOW (ref >60)
GFR calc non Af Amer: >60 mL/min (ref >60)
GFR calc non Af Amer: >60 mL/min (ref >60)
Glucose, Bld: 108 mg/dL — ABNORMAL HIGH (ref 70–99)
Glucose, Bld: 111 mg/dL — ABNORMAL HIGH (ref 70–99)
Potassium: 3.6 mEq/L (ref 3.5–5.1)
Potassium: 4.3 mEq/L (ref 3.5–5.1)
Potassium: 4.3 mEq/L (ref 3.5–5.1)
Sodium: 138 mEq/L (ref 135–145)
Sodium: 141 mEq/L (ref 135–145)

## 2010-12-09 LAB — COMPREHENSIVE METABOLIC PANEL
AST: 30 U/L (ref 0–37)
Albumin: 3.4 g/dL — ABNORMAL LOW (ref 3.5–5.2)
Calcium: 9.3 mg/dL (ref 8.4–10.5)
Chloride: 104 mEq/L (ref 96–112)
Creatinine, Ser: 0.71 mg/dL (ref 0.4–1.2)
GFR calc Af Amer: >60 mL/min (ref >60)
Total Bilirubin: 1.1 mg/dL (ref 0.3–1.2)
Total Protein: 6.2 g/dL (ref 6.0–8.3)

## 2010-12-09 LAB — URINALYSIS, ROUTINE W REFLEX MICROSCOPIC
Bilirubin Urine: NEGATIVE
Hgb urine dipstick: NEGATIVE
Nitrite: POSITIVE — AB
Specific Gravity, Urine: 1.016 (ref 1.005–1.030)
pH: 8 (ref 5.0–8.0)

## 2010-12-09 LAB — TYPE AND SCREEN

## 2010-12-09 LAB — APTT: aPTT: 32 seconds (ref 24–37)

## 2011-01-13 ENCOUNTER — Other Ambulatory Visit: Payer: Self-pay | Admitting: Hematology & Oncology

## 2011-01-13 ENCOUNTER — Ambulatory Visit (HOSPITAL_BASED_OUTPATIENT_CLINIC_OR_DEPARTMENT_OTHER)
Admission: RE | Admit: 2011-01-13 | Discharge: 2011-01-13 | Disposition: A | Discharge status: Home / Self Care | Payer: Medicare Other | Source: Ambulatory Visit | Attending: Hematology & Oncology | Admitting: Hematology & Oncology

## 2011-01-13 ENCOUNTER — Encounter (HOSPITAL_BASED_OUTPATIENT_CLINIC_OR_DEPARTMENT_OTHER): Payer: Medicare Other | Admitting: Hematology & Oncology

## 2011-01-13 DIAGNOSIS — C186 Malignant neoplasm of descending colon: Secondary | ICD-10-CM

## 2011-01-13 DIAGNOSIS — M25559 Pain in unspecified hip: Secondary | ICD-10-CM

## 2011-01-13 DIAGNOSIS — K7460 Unspecified cirrhosis of liver: Secondary | ICD-10-CM

## 2011-01-13 DIAGNOSIS — K766 Portal hypertension: Secondary | ICD-10-CM

## 2011-01-13 DIAGNOSIS — Z1231 Encounter for screening mammogram for malignant neoplasm of breast: Secondary | ICD-10-CM

## 2011-01-13 DIAGNOSIS — Z8551 Personal history of malignant neoplasm of bladder: Secondary | ICD-10-CM

## 2011-01-13 LAB — CEA: CEA: 2.2 ng/mL (ref 0.0–5.0)

## 2011-01-13 LAB — COMPREHENSIVE METABOLIC PANEL
BUN: 10 mg/dL (ref 6–23)
CO2: 26 mEq/L (ref 19–32)
Calcium: 9.4 mg/dL (ref 8.4–10.5)
Chloride: 103 mEq/L (ref 96–112)
Creatinine, Ser: 0.65 mg/dL (ref 0.40–1.20)
Glucose, Bld: 119 mg/dL — ABNORMAL HIGH (ref 70–99)

## 2011-01-13 LAB — CBC WITH DIFFERENTIAL (CANCER CENTER ONLY)
BASO#: 0 10*3/uL (ref 0.0–0.2)
HCT: 34.1 % — ABNORMAL LOW (ref 34.8–46.6)
HGB: 11.2 g/dL — ABNORMAL LOW (ref 11.6–15.9)
LYMPH#: 0.8 10*3/uL — ABNORMAL LOW (ref 0.9–3.3)
MONO#: 0.6 10*3/uL (ref 0.1–0.9)
NEUT#: 3.1 10*3/uL (ref 1.5–6.5)
NEUT%: 68.3 % (ref 39.6–80.0)
RBC: 4.16 10*6/uL (ref 3.70–5.32)
WBC: 4.6 10*3/uL (ref 3.9–10.0)

## 2011-01-14 ENCOUNTER — Ambulatory Visit (HOSPITAL_BASED_OUTPATIENT_CLINIC_OR_DEPARTMENT_OTHER)
Admission: RE | Admit: 2011-01-14 | Discharge: 2011-01-14 | Disposition: A | Discharge status: Home / Self Care | Payer: Medicare Other | Source: Ambulatory Visit | Attending: Hematology & Oncology | Admitting: Hematology & Oncology

## 2011-01-14 DIAGNOSIS — Z1231 Encounter for screening mammogram for malignant neoplasm of breast: Secondary | ICD-10-CM | POA: Insufficient documentation

## 2011-02-07 NOTE — Op Note (Signed)
   NAME:  DANYELA, POSAS                       ACCOUNT NO.:  [id number]   MEDICAL RECORD NO.:  [id number]                   PATIENT TYPE:  AMB   LOCATION:  DSC                                  FACILITY:  [facility]   PHYSICIAN:  Haywood Lasso, M.D.           DATE OF BIRTH:  [birth year]   DATE OF PROCEDURE:  DATE OF DISCHARGE:                                 OPERATIVE REPORT   OFFICE MEDICAL RECORD NUMBER:  [id number]   PREOPERATIVE DIAGNOSIS:  Retained Port-A-Cath.   POSTOPERATIVE DIAGNOSIS:  Retained Port-A-Cath.   PROCEDURE:  Removal of Port-A-Cath.   SURGEON:  Haywood Lasso, M.D.   ANESTHESIA:  Local.   CLINICAL HISTORY:  This patient has completed her chemo and desired to have  her Port-A-Cath removed.   DESCRIPTION OF PROCEDURE:  In the minor procedure room the area over the  Port-A-Cath was prepped with some alcohol sponge and anesthetized with about  10 cc of 1% Xylocaine with some bicarb added.  We waited about 10 minutes  and then did a formal prep of the area with Betadine.  The old incision was  reopened and the Port-A-Cath reservoir identified. The 2 Prolene holding  sutures were cut and pulled out.  I incised a little bit down the tract to  get that freed up and then manipulated the port out of its capsule. I put a  3-0 Vicryl around the tract and then back to the other tract and tied the  Vicryl down.  There is no backbleeding. The port came out intact.   The incision was closed with some 3-0 Vicryl subcutaneous plus Dermabond on  the skin.   The patient tolerated the procedure well and no complications were noted.                                               Haywood Lasso, M.D.    CJS/MEDQ  D:  01/27/2003  T:  01/28/2003  Job:  [id number]

## 2011-02-07 NOTE — Procedures (Signed)
[place]. [facility]  Patient:    [name], [name]                    MRN: [id number] Proc. Date: 06/04/00 Adm. Date:  [id number] Attending:  Juanita Craver                           Procedure Report  DATE OF BIRTH:  [birth year]  PROCEDURE: Colonoscopy with snare polypectomy x 1, with hot biopsy x 1.  ENDOSCOPIST:  Nelwyn Salisbury, M.D.  INSTRUMENT USED:  Olympus video colonoscope.  INDICATIONS:  This 70 year old white female with a history of rectal bleeding and a family history of colon cancer.  Rule out colonic polyps.  PREPROCEDURE PREPARATION:  Informed consent was procured from the patient. The patient was fasted for eight hours prior to the procedure and prepped with a bottle of magnesium citrate and a gallon of NuLYTELY on the night prior to the procedure.  PREPROCEDURE PHYSICAL EXAMINATION:  VITAL SIGNS:  The patient had stable vital signs.  NECK:  Supple.  CHEST:  Clear to auscultation.  HEART:  S1, S2 regular.  ABDOMEN:  Soft, with normal abdominal sounds, obese.   The patient has laparoscopic cholecystectomy scars and a tubal ligation scar from previous surgery.  Nontender.  DESCRIPTION OF PROCEDURE:  The patient was placed in the left lateral decubitus position and sedated with an additional 20 mg of Demerol and 2 mg of Versed intravenously.  Once the patient was adequately sedated and maintained on low-flow oxygen, continuous cardiac monitoring, the Olympus video colonoscope was advanced from the rectum to the cecum without difficulty.  A large pedunculated polyp was seen at 35.0 cm.  Epinephrine 60 cc were injected into the stalk of the polyp, and the polyp was snared without difficulty; however, after the polyp was removed, there seemed to be a flat lobulated mass at the base of the polyp.  On careful inspection of the polyp, there actually seemed a portion of this mass that has been removed.  Multiple pictures  were taken to document this.  A small polyp was also removed by hot biopsy distal to the large polyp mentioned.  There was extensive left-sided diverticulosis. The cecum, right colon, and transverse colon appeared normal.  Small external hemorrhoids were seen on anal inspection.  IMPRESSION: 1. Small nonbleeding external hemorrhoid. 2. Extensive left-sided diverticulosis. 3. Small polyp removed by hot biopsy at 30.0 cm. 4. Large pedunculated polyp snared at 35.0 cm (after injecting the base    with 60 cc of epinephrine). 5. Polypoid mass seen after snaring the pedunculated polyp which then    seemed to be an extension of the mass itself. 6. Normal-appearing cecum, right colon, and transverse colon.  RECOMMENDATIONS: 1. Await pathology results. 2. No nonsteroidal anti-inflammatory drugs. 3. CT scan of the abdomen and pelvis. 4. CEA levels. 5. PT, PTT with CBC and comprehensive metabolic panel to be done today. DD:  06/04/00 TD:  06/05/00 Job: [id number]

## 2011-02-07 NOTE — Procedures (Signed)
[place]. [facility]  Patient:    [name], [name]                    MRN: [id number] Proc. Date: 06/10/00 Adm. Date:  [id number] Attending:  Juanita Craver CC:         Hurshel Party, M.D.  Odis Hollingshead, M.D.   Procedure Report  DATE OF BIRTH:  1941-06-30.  REFERRING PHYSICIAN:  PROCEDURE PERFORMED:  Flexible sigmoidoscopy with snare polypectomy x 1 and cold biopsies x 8.  ENDOSCOPIST:  Nelwyn Salisbury, M.D.  INSTRUMENT USED:  Olympus video colonoscope.  INDICATIONS FOR PROCEDURE:  Rectal bleeding with mass in the midleft colon in a 70 year old white female.  Plans are to rebiopsy this lesion as visualization at original colonoscopy was not adequate secondary to a large amount of residual stool in the colon.  PREPROCEDURE PREPARATION:  Informed consent was procured from the patient. The patient was fasted for eight hours prior to the procedure and prepped with a bottle of Fleets Phospho-Soda and two enemas prior to the procedure.  PREPROCEDURE PHYSICAL:  The patient had stable vital signs.  Neck supple. Chest clear to auscultation.  S1, S2 regular.  Abdomen soft with normal abdominal bowel sounds.  The patient has infraumbilical midline scar from previous tubal ligation and laparoscopic cholecystectomy scars from previous surgery.  DESCRIPTION OF PROCEDURE:  The patient was placed in the left lateral decubitus position and sedated with 60 mg of Demerol and 5 mg of Versed intravenously.  Once the patient was adequately sedated and maintained on low-flow oxygen and continuous cardiac monitoring, the Olympus video colonoscope was advanced from the rectum to 60 cm without difficulty.  The patient had a small polyp that was snared from 10 cm.  The patient had significant left-sided diverticular disease.  A flat mass with a necrotic center was seen at 35 to 40 cm and was biopsied for pathology.  The patient tolerated the procedure well  without complication.  IMPRESSION: 1. Small polyp was snared at 10 cm. 2. Mass biopsied at 35 cm. 3. Diverticulosis in the left colon. 4. Small external hemorrhoids.  RECOMMENDATIONS: 1. No nonsteroidals for now. 2. Await pathology results. 3. Surgical evaluation by Odis Hollingshead, M.D. next week. 4. Outpatient follow-up in the next two weeks.DD:  06/10/00 TD:  06/11/00 Job: [id number]

## 2011-02-07 NOTE — Op Note (Signed)
NAMEGOWRI, SUCHAN             ACCOUNT NO.:  [id number]   MEDICAL RECORD NO.:  [id number]          PATIENT TYPE:  AMB   LOCATION:  ENDO                         FACILITY:  [facility]   PHYSICIAN:  Ronald Lobo, M.D.   DATE OF BIRTH:  29-Aug-1941   DATE OF PROCEDURE:  09/30/2004  DATE OF DISCHARGE:                                 OPERATIVE REPORT   PROCEDURE PERFORMED:  Upper endoscopy.   ENDOSCOPIST:  Cleotis Nipper, M.D.   INDICATIONS FOR PROCEDURE:  Heme positive stool in a 70 year old female.   FINDINGS:  Small hiatal hernia.   DESCRIPTION OF PROCEDURE:  The risks of the procedure had been reviewed with  the patient, who provided written consent.  Sedation for this procedure and  the colonoscopy which followed it totaled fentanyl 75 mcg and Versed 8 mg IV  without arrhythmias or desaturation.  The Olympus video endoscope was passed  under direct vision.  The vocal cords were not well seen.  The esophageal  mucosa was quite readily entered and had normal mucosa without evidence of  reflux esophagitis, Barrett's esophagus, varices, infection or neoplasia.  There was a small hiatal hernia, no definite esophageal ring and no  stricture.  The stomach contained no significant residual and had  essentially normal mucosa other than a little bit of sandpaper erythema in  the antral region.  No erosions, ulcers, or masses were seen but there was a  small submucosal nodule measuring about 1 cm on the lesser curve of the  distal body which I think was probably a lipoma.  The pylorus, duodenal bulb  and second duodenum were unremarkable again except for the presence of some  punctate erythema without erosive or ulcerative changes.   The scope was then removed from the patient.  No biopsies were obtained.  The patient tolerated the procedure well and there were no apparent  complications.   IMPRESSION:  Heme positive stool without source evident on current exam.  No  major findings  identified (8832).   PLAN:  Proceed to colonoscopic evaluation. No endoscopic follow-up required.      RB/MEDQ  D:  09/30/2004  T:  09/30/2004  Job:  [id number]   cc:   Rudell Cobb. [name], M.D.  [address], [place] [address]  Fax: [place]. Jeffie Pollock, M.D.  [place]. [address] [place]  [place] [address]  Fax: [phone]   Odis Hollingshead, M.D.  [address] [place]  [place] [address]  Fax: [phone]

## 2011-02-07 NOTE — Op Note (Signed)
[place]. [facility]  Patient:    [name], [name]                    MRN: [id number] Proc. Date: 06/26/00 Adm. Date:  [id number] Attending:  Erick Blinks CC:         Hurshel Party, M.D.  Nelwyn Salisbury, M.D.   Operative Report  PREOPERATIVE DIAGNOSIS:  Tubulovillous adenoma of left colon.  POSTOPERATIVE DIAGNOSIS:  Tubulovillous adenoma of left colon.  PROCEDURE:  Partial left colectomy and intraoperative endoscopy by Dr. Nelwyn Salisbury.  SURGEON:  Odis Hollingshead, M.D.  ASSISTANT:  Coralie Keens, M.D.  ANESTHESIA:  General.  INDICATION:  Ms. Byer is a 70 year old female who has had a polypoid lesion in the left colon that was unable to be removed endoscopically.  It was a tubulovillous adenoma that had been biopsied multiple times.  She now presents for elective partial colectomy.  TECHNIQUE:  She is placed supine on the operating table, and general anesthetic was administered.  Her abdomen was sterilely prepped and draped.  A midline incision was made beginning in the epigastrium and carrying this down around the umbilicus to the level of the symphysis pubis sharply. Subcutaneous tissue, fascia, and peritoneum were incised with the cautery. Once inside, the abdomen was explored.  She had a lot of appendages epiploica and diverticular disease noted in the descending and sigmoid colon.  I could not definitively palpate a lesion.  I subsequently performed a colotomy at what I felt was approximately the 25-30 cm mark and inserted a rigid sigmoidoscopy both proximally and distally.  I further extended to 25 cm.  I could not definitely identify something as it was described in the endoscopists report.  Subsequently I closed the colotomy, and Dr. Collene Mares came into the operating room and performed colonoscopy beginning from the anus and progressing proximally. She was able to identify the area of suspicion, and I then palpated the  end of the colonoscope at this time and marked the area with a suture.  She subsequently removed the colonoscope.  I then mobilized the left colon all the way up to the splenic flexure but did not have to mobilize the splenic flexure.  I divided the colon 15 cm proximal to the lesion and 10 cm distal to it.  I then resected the mesentery by dividing the vessels between the [facility] clamps and ligating it.  I then took the specimen off the field and opened it on the back table and marked the polypoid lesion with a suture.  I aligned the two cut ends of the proximal and distal bowel, and they were tension-free.  I subsequently performed a single layer anastomosis of the end-to-end fashion using interrupted 2-0 Vicryl suture.  The anastomosis was patent, viable, and under no tension.  There was no significant mesentery defect to close.  I irrigated out the abdominal cavity, and hemostasis was adequate.  I next closed the fascia with a running #1 PDS suture.  The subcutaneous tissue was irrigated, and the skin was closed with staples.  She tolerated the procedure well without any apparent complications.  She was going to get an epidural catheter placed for postoperative pain control and then be taken to the recovery room. DD:  06/26/00 TD:  06/27/00 Job: [id number]

## 2011-02-07 NOTE — Procedures (Signed)
[place]. [facility]  Patient:    [name], [name]                    MRN: [id number] Proc. Date: 06/04/00 Adm. Date:  [id number] Attending:  Juanita Craver CC:         Hurshel Party, M.D.   Procedure Report  DATE OF BIRTH:  Jun 14, 1941  REFERRING PHYSICIAN:  Hurshel Party, M.D.  PROCEDURE PERFORMED:  Esophagogastroduodenoscopy.  ENDOSCOPIST:  Nelwyn Salisbury, M.D.  INSTRUMENT USED:  Olympus video panendoscope.  INDICATIONS:  A 70 year old white female with a history of solid food dysphagia and rectal bleeding.  Rule out peptic ulcer disease, esophagitis, gastritis, etc.  PREPROCEDURE PREPARATION:  Informed consent was procured from the patient. The patient was fasted for 8 hours prior to the procedure.  PREPROCEDURE PHYSICAL:  Patient has stable vital signs.  NECK:  Supple.  CHEST:  Clear to auscultation. S1, S2 regular.  ABDOMEN:  Soft with normal abdominal bowel sounds.  DESCRIPTION OF PROCEDURE:  The patient was placed in left lateral decubitus position and sedated with 60 mg of Demerol and 6 mg of Versed intravenously. Once the patient was adequately sedated and maintained on low-flow oxygen and continuous cardiac monitoring, the Olympus video panendoscope was advanced through the mouth piece, over the tongue into the esophagus under direct vision.  The proximal esophagus appeared normal.  There was a wide open Schatzki ring.  At the GE junction no frank esophagitis or erosions were seen.  No esophageal masses were present.  There was no evidence of Barretts mucosa.  Scope passed easily into the stomach.  The entire gastric mucosa appeared healthy.  A small hiatal hernia was seen on retroflexion.  The proximal small bowel appeared normal as well.  IMPRESSION: 1. Wide open Schatzki ring. 2. Small hiatal hernia. 3. Otherwise normal esophagogastroduodenoscopy.  RECOMMENDATIONS:  Proceed with colonoscopy at this time. DD:   06/04/00 TD:  06/05/00 Job: [id number]

## 2011-02-07 NOTE — Procedures (Signed)
[place]. [facility]  Patient:    [name], [name]                    MRN: [id number] Proc. Date: 06/26/00 Adm. Date:  [id number] Attending:  Erick Blinks CC:         Odis Hollingshead, M.D.   Procedure Report  DATE OF BIRTH:  [birth year]  REFERRING PHYSICIAN:  Odis Hollingshead, M.D.  PROCEDURE PERFORMED:  Intraoperative flexible sigmoidoscopy up to 60 cm.  ENDOSCOPIST:  Nelwyn Salisbury, M.D.  INSTRUMENT USED:  Olympus video colonoscope.  INDICATIONS FOR PROCEDURE:  Inability to locate the mass seen in the left colon during exploration of the left colon through a colotomy.  PREPROCEDURE PREPARATION:  Consent could not be procured from the patient as the patient was under general anesthesia.  No physical exam was done.  DESCRIPTION OF PROCEDURE:  The patient was in a supine position.  The Olympus video colonoscope was advanced from the rectum to 60 cm without difficulty. The colotomy incision was clearly identified and abnormal folds were noticed about 35 to  40 cm.  Dr. Zella Richer marked this site with a suture and proceeded with surgery.  Left-sided diverticulosis was seen.  No other abnormalities were recognized up to 60 cm.  IMPRESSION: 1. Left-sided diverticulosis. 2. Mass at 35 cm.  RECOMMENDATIONS:  Proceed with left hemicolectomy as discussed with Dr. Zella Richer.DD:  06/26/00 TD:  06/29/00 Job: [id number]

## 2011-02-07 NOTE — Consult Note (Signed)
[name], [name]             ACCOUNT NO.:  [id number]   MEDICAL RECORD NO.:  [id number]          PATIENT TYPE:  INP   LOCATION:  0103                         FACILITY:  [facility]   PHYSICIAN:  Marcello Moores A. Cornett, M.D.DATE OF BIRTH:  Oct 03, 1940   DATE OF CONSULTATION:  09/25/2006  DATE OF DISCHARGE:                                 CONSULTATION   PHYSICIAN REQUESTING CONSULTATION:  Nat Christen, MD   REASON FOR CONSULTATION:  Nausea, vomiting, abdominal pain.   HISTORY OF PRESENT ILLNESS:  The patient is a 69 year old female with a  past medical history of right colon cancer resected by [name] in  2006, history of bladder cancer status post total cystectomy with  ileoconduit back in 2003 by Dr. Jeffie Pollock, and a history of partial small-  bowel obstruction in 2005.  He has just recently completed chemotherapy  for a colon cancer; and had her Port-A-Cath removed about 2 weeks of Dr.  Margot Chimes.  She presents with a 1-day history of nausea, vomiting, and some  mild abdominal pain.  She came to the emergency room because she has a  history of partial small-bowel obstructions; and felt that this was  similar to what she had in the past but not severe, she states.  She is  complaining of nausea and vomiting which she had for 1 day.  Her last  bowel movement and passage of flatus was yesterday.  She currently is  not complaining of any abdominal pain, she states.  This started  yesterday, again mostly nausea and vomiting with minimal periumbilical  abdominal pain which is mild-to-moderate in intensity.  There was no  radiation of this pain; and currently the pain is now gone.  She  currently, is not experiencing any nausea or vomiting.  She was seen by  Dr. Lacinda Axon and an acute abdominal series showed some dilated small bowel,  possibly consistent with a small-bowel obstruction.  I was asked to see  the patient at the request of Dr. Lacinda Axon for this.   PAST MEDICAL HISTORY:  1. History of ascending  colon cancer status post right hemicolectomy      [name], [facility].  2. History of bladder cancer status post total cystectomy with      ileoconduit with Dr. Jenny Reichmann in 2003.  3. Status post chemotherapy for the above.  4. History of hypertension currently on Altace.  5. History of partial small-bowel obstruction managed nonoperatively.  6. History of ventral hernia repair, primarily [name] during her      right hemicolectomy.  7. History of Port-A-Cath x2 by Dr. Neldon Mc.   PAST SURGICAL HISTORY:  Please see above.   MEDICATIONS INCLUDE:  Altace 5 mg a day.   ALLERGIES INCLUDE:  CEFTIN, MORPHINE SULFATE, PHENERGAN, and CODEINE.   SOCIAL HISTORY:  Denies tobacco or alcohol use and she is married.   FAMILY HISTORY:  Noncontributory.   REVIEW OF SYSTEMS:  A 15-point review of systems is otherwise negative  except for that stated above.   PHYSICAL EXAMINATION:  VITAL SIGNS:  Temperature 97, pulse 86, blood  pressure 148/69.  GENERAL  APPEARANCE:  A pleasant female sitting up in bed in no apparent  distress.  HEENT:  No evidence of scleral icterus.  Mucous membranes are moist.  NECK:  Supple, nontender.  Trachea midline.  No mass.  CHEST:  Lung sounds are clear bilaterally without wheezes or rhonchi.  Chest wall motion is normal.  CARDIOVASCULAR:  Regular rate and rhythm without tachycardia, rub,  murmur or gallop.  Feet are warm with good perfusion.  ABDOMEN:  Numerous scars are noted in her abdomen.  No obvious hernia  defects.  There was mild distension.  There is no rebound, guarding, or  signs of peritonitis or tenderness on examination.  Bowel sounds are  decreased.  GENITOURINARY:  No evidence of inguinal hernia.  No evidence of femoral  hernia.  Right lower quadrant ileostomy seen with appliance over this.  Clear urine in her bag.  No evidence of parastomal hernia.  EXTREMITIES:  Muscle tone is normal.  There is trace bilateral lower  extremity edema noted.   NEUROLOGIC EXAMINATION:  Motor and sensory function are grossly intact.  She is a Glasgow coma scale 15.  Cranial nerves II-XII are intact.  SKIN:  No obvious areas of breakdown or rash.  PSYCHIATRIC:  Motor and sensory function are normal.  Affect is normal.  She is pleasant and oriented to time and place.   DIAGNOSTIC STUDIES:  Acute abdominal series shows some dilated small  bowel in the midabdomen.  CT scan was reviewed by myself which shows  some proximal dilated small bowel.  There was some weakness in the  abdominal wall towards the patient's left lower abdomen.  I do not see  any obvious hernia there, but the abdominal wall is quite thin there.  There is stool in the colon.  There is some decompressed distal bowel,  but fluid within that bowel.  CT scan reveals no free air.  She does  have splenomegaly. She has a white count of 6600 with minimal left shift  to 82% neutrophils.  Hemoglobin 13, hematocrit 39, platelet count is  149,000.  Sodium 140, potassium 3.8, chloride 108, CO2 26, BUN 14,  creatinine 0.6, glucose 175.  Her liver functions show a slight  elevation in her AST and ALT to 72 and 57.  Lipase is 47.   IMPRESSION:  1. Partial small-bowel obstruction with nausea and vomiting.  2. History of colon cancer.  3. History of bladder cancer.  4. History of ileal conduit.  5. Hypertension.  6. History of partial small-bowel obstruction.   PLAN:  Will admit for IV hydration, nasogastric tube decompression.  We  will repeat her films in the morning.  She has no abdominal tenderness  at this point in time, so I feel that observation is warranted.  If her  condition were to worsen, or if she does not resolve in the next 24-48  hours, she may require surgical exploration which I have explained to  the patient.  She understands the above and agrees to proceed.      Thomas A. Cornett, M.D.  Electronically Signed   TAC/MEDQ  D:  09/25/2006  T:  09/25/2006  Job:   [id number]   cc:   Haywood Lasso, M.D.  [address] [place]  [place] [address]

## 2011-02-07 NOTE — Discharge Summary (Signed)
NAME:  [name], [name]                       ACCOUNT NO.:  [id number]   MEDICAL RECORD NO.:  [id number]                   PATIENT TYPE:  INP   LOCATION:  6073                                 FACILITY:  [facility]   PHYSICIAN:  Marshall Cork. Jeffie Pollock, M.D.                 DATE OF BIRTH:  November 14, 1940   DATE OF ADMISSION:  09/02/2002  DATE OF DISCHARGE:  09/08/2002                                 DISCHARGE SUMMARY   HISTORY:  Briefly, [name] is a 70 year old white female, who was seen  in consultation initially by Doree Albee, M.D. for a six day history  of intermittent gross hematuria.  Office evaluation with IVP and cystoscopy  demonstrated filling defects in the bladder suspicious for bladder tumors.  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  After discussion of treatment  options, she elected cystectomy with ileal conduit urinary diversion.   PAST MEDICAL HISTORY:  ALLERGIES:  CEFTIN, CODEINE, MORPHINE.   ADMISSION MEDICATIONS:  1. Altace 5 mg q.d.  2. Hydrochlorothiazide 5 mg q.d.   MEDICAL HISTORY:  1. Hypertension.  2. History of small bowel obstruction.   PAST SURGICAL HISTORY:  1. Colon resection for an angioma on June 23, 2000.  She had a wound     dehiscence due to an anastomotic leak that required re-operation on     July 06, 2000.  She had a colostomy placed at this procedure.  She had     her colostomy reversed in June 2002.  2. History of tubal ligation.  She is gravida 4, para 4, with normal vaginal     deliveries.   SOCIAL HISTORY:  She has been a pack-a-day smoker for 20 years but quit in  1998.  She denies alcohol.  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   FAMILY HISTORY:  Unremarkable.   REVIEW OF SYSTEMS:  She has had headaches, blood in her urine, swelling of  the right foot following an injury, urinary frequency  with some  incontinence.  She is otherwise without complaints.   PHYSICAL EXAMINATION:  VITAL SIGNS:  Blood pressure 134/86, heart rate 68,  respirations 12, temperature 98.2.  GENERAL:  She is a well-developed, well-nourished, white female, in no acute  distress, alert and oriented x 3.  HEENT:  Normocephalic, atraumatic.  NECK:  Supple without thyromegaly or bruit.  LUNGS:  Clear.  HEART:  Regular rate and rhythm.  ABDOMEN:  Soft, obese, and nontender without mass, lesion,  hepatosplenomegaly, or CVA tenderness.  She has well-healed scars.  No  hernias or inguinal adenopathy are noted.  GU:  Normal external genitalia.  Urethral meatus unremarkable.  Bladder and  urethra have hypermobility without significant cystocele or rectocele.  She  has a mass in the right bladder noted at exam  under anesthesia.  Cervix and  uterus are unremarkable.  No adnexal masses are noted.  Anus and perineum  without lesions.  EXTREMITIES:  Acceptable range of motion without edema.  NEUROLOGIC:  Grossly intact.  SKIN:  Warm and dry.   ACCESSORY AND CLINICAL INFORMATION:  Admission labs:  Hemoglobin 12.4, white  count 7.1, PT 12.6, PTT 32.  Electrolytes within normal limits with the  exception of potassium low at 2.7.  Glucose was 151, albumin 3.4.  Hemoglobin A1c was 5.6.  EKG:  Incomplete right bundle-branch block, normal  sinus rhythm with sinus arrythmia.  Chest x-ray was clear.  Preoperative CT  scan demonstrated the mass at the right base of the bladder with ureteral  obstruction on the right.   HOSPITAL COURSE:  On August 22, 2002, she was taken to the operating room.  She underwent a radical cystectomy with ileal conduit urinary diversion.  She underwent extensive lysis of adhesions by Haywood Lasso, M.D.  She  tolerated the procedure well and was left with bilateral single-J stents and  a Blake drain.  Postoperatively, her vital signs were stable and with a  blood pressure of 150/74.   Pulse was 78.  Respirations were 16.  She had  good urine output.  Her hemoglobin was 11.6.  Her glucose was 168, and  remainder of her electrolytes were unremarkable.  On the first postoperative  day, she was anxious and sore but otherwise without complaints.  She was  afebrile with stable vital signs.  She had positive bowel sounds.  The wound  was intact, but the stoma appliance was leaking due to some recession of the  stoma and a bit of a crease toward the umbilicus.  The stoma was pink and  productive.  Urine output was difficult to ascertain due to the leakage.  Jackson-Pratt had 90 cc for eight hours.  Hemoglobin was 11.  White count  was 9.6.  Electrolytes were unremarkable.  Her IV fluids were discontinued.  A stoma therapy consult was obtained.  Haywood Lasso, M.D. followed  the patient postoperatively.  Doree Albee, M.D. saw her as well and  monitored her hyperglycemia and hypokalemia.  On the second postoperative  day, she was doing well.  She had no flatus and no nausea and vomiting.  T-  max was 100.8.  Her vital signs were stable.  Her wound appeared healthy.  The stoma was healthy.  She continued to have some problems with appliance  leak.  She had positive bowel sounds.  JP had minimal output.  Hemoglobin  was 11.5, white count 9.4.  Electrolytes were within normal limits.  Glucose  was 116.  Hemoglobin A1c was normal at 5.6.  Chest x-ray was obtained as  well.  An order was written for transfer from the ICU to [facility] [place].  Her IV's  were discontinued.  She was encouraged to ambulate.  We kept her n.p.o. with  an NG at that point as well.  On the third postoperative day, she was doing  well.  She had no flatus or BM.  Still some problems with the leaky stoma  site.  T-max was 101.  Vital signs were stable.  She had good urine output. Abdomen was soft and flat with positive bowel sounds.  The stoma was pink.  Her potassium was 3.1; glucose was 148.  It was felt that  her postoperative  fever was likely pulmonary, and an incentive spirometry was encouraged.  She  was given Dulcolax suppository.  On August 25, 2002, we received her  pathology which demonstrated a T2B, N1, MX, transitional cell carcinoma of  the bladder.  One of thirteen right nodes had microscopic involvement.  The  left side was unremarkable.  An oncology consult was obtained.  On the  fourth postoperative day, she was doing well.  She was getting a little bit  irritated by her NG tube.  She was afebrile.  She had good urine output.  She continued to have good bowel sounds.  Her stoma was healthy.  Wound was  intact.  Her hemoglobin was 8.6.  It was felt that the low hemoglobin may  have been secondary to drawing the blood from her IJ since it had been 11.5  the day before and had no sign of active bleeding.  I clamped her NG, gave  her Dulcolax suppository.  A repeat H&H was ordered for the following day.  Rudell Cobb. [name], M.D. saw the patient and suggested follow-up in January  for consideration of chemotherapy.  The evening of August 26, 2002, I  discontinued the NG tube but limited her to sips and chips.  On August 27, 2002, T-max was 100.3.  She had a bowel movement the night before.  She was  started on some sips of clear liquids.  Her potassium remained somewhat low,  so the potassium was increased in her IV fluids.  On the sixth postoperative  day, August 28, 2002, her T-max was 100.4; hemoglobin was 9.9; potassium  was 3.4.  She was progressing well.  Her diet was advanced.  On the seventh  postoperative day, August 29, 2002, she was complaining of mouth and  vaginal irritation, consistent with thrush and yeast vaginitis.  She had had  a bowel movement.  She had nausea or vomiting.  She was afebrile.  Her  abdomen remained soft with positive bowel sounds.  Wound and stoma were  intact.  Hemoglobin was 8.0; potassium was down to 2.9.  Her IV was  discontinued as was her  PCA pump.  Her staples were discontinued.  She was  started on K-Dur 20 mg b.i.d.  Her diet was increased.  Doree Albee,  M.D. increased the potassium to 40 mg twice daily.  On August 30, 2002,  eighth postoperative day, she had a bowel movement.  She was without  complaints.  There was a little bit of wound drainage noted.  She remained  afebrile.  Her abdomen was soft with positive bowel sounds.  There was some  separation of approximately 4 cm of the superior portion of the wound with a  small clot but without evidence of infection.  This was opened and packed  with a saline-soaked gauze.  The fascia was probed with a Q-tip, and there  was no evidence of dehiscence.  The stoma remained pink with stents in  place.  Her potassium was 3.1.  BMP was otherwise normal.  Saline-soaked  dressing changes were started twice daily.  Her antihypertensives were restarted per Doree Albee, M.D.  On August 31, 2002, the ninth  postoperative day, she was having some back and abdominal pain that was felt  to be secondary to increased activity.  She was otherwise without  complaints.  She is afebrile.  Vital signs are stable.  Her wound separation  was starting to granulate.  She was started on an iron supplement for her  anemia.  Dressing changes were continued.  She  was given Vioxx for pain.  On  September 01, 2002, she was doing well without complaints.  She was afebrile.  Vital signs were stable.  Lungs were clear.  Heart regular rate and rhythm.  Abdomen was soft with positive bowel sounds.  Her wound was granulating  well.  Stoma remained pink.  Stents present.  Her potassium was up to 4.8.  She was felt to be ready for discharge home with a final diagnosis of  invasive bladder cancer with a nodal metastases for a stage T2B, N1, M0  tumor.   SECONDARY DIAGNOSES:  1. Multiple bowel adhesions.  2. Superficial wound separation.  3. Hypokalemia.  4. Acute blood loss anemia.  5.  Hypertension.   DISPOSITION:  To home.   CONDITION:  Improved.   PROGNOSIS:  Fair.   DISCHARGE INSTRUCTIONS:  She was instructed to follow up with me in one week  for a recheck.   DISCHARGE MEDICATIONS:  1. Resume her home medications.  2. Given a prescription for Vicodin 1-2 p.o. q.4-6h. p.r.n. pain.  3. Iron supplement.   Home health consult was obtained for dressing changes and stoma checks.                                               Marshall Cork. Jeffie Pollock, M.D.    JJW/MEDQ  D:  09/08/2002  T:  09/09/2002  Job:  [id number]   cc:   Haywood Lasso, M.D.  [address], [place] [address]  Fax: [phone]   Doree Albee, M.D.  [facility] [place] [address]  [place]  [place] [address]  Fax: [place]. [name], M.D.  [address]  [place], [place] [address]  Fax: [phone]

## 2011-02-07 NOTE — Procedures (Signed)
. [facility]  Patient:    [name], [name]                    MRN: [id number] Proc. Date: 06/26/00 Adm. Date:  [id number] Attending:  Erick Blinks                           Procedure Report  PROCEDURE:  Lumbar epidural catheter placement for postoperative pain control.  ANESTHESIOLOGIST:  Glynda Jaeger, M.D.  INDICATION:  Ms. [name] is a 70 year old white female with a history of a carcinoma of the colon.  She underwent a subtotal colectomy by Dr. Odis Hollingshead under general anesthesia.  Prior to the procedure, the risks and benefits of epidural pain control were discussed with the patient. These included bleeding, headache, infection, nerve root injury and spinal cord injury; the patient understood these risks of the procedure and agreed to proceed with this form of pain control following surgery.  DESCRIPTION OF PROCEDURE:  Following completion of the procedure, while the patient was still under general endotracheal anesthesia, she was turned to the right lateral decubitus position and the low back was prepped and draped sterilely.  The L3-4 space was entered in the midline using an 18-gauge Tuohy needle.  One pass of the needle was required to enter the epidural space using loss-of-resistance technique with air.  Aspiration of the needle was negative for blood or CSF.  Ten cc of preservative-free saline, to which were added 50 mg of 1% lidocaine and 100 mcg of Fentanyl, were injected through the needle without difficulty.  The catheter was then advanced 4 cm into the epidural space and the needle was removed without difficulty.  The catheter was then taped securely in place.  The patient was then turned to the supine position, extubated and brought to the recovery room in stable condition.  She will then be begun on an epidural Fentanyl and Marcaine infusion and followed on a daily basis by the anesthesia service. DD:   06/26/00 TD:  06/27/00 Job: [id number]

## 2011-02-07 NOTE — H&P (Signed)
NAME:  [name], [name]                       ACCOUNT NO.:  [id number]   MEDICAL RECORD NO.:  [id number]                   PATIENT TYPE:  INP   LOCATION:  7416                                 FACILITY:  [facility]   PHYSICIAN:  Lucina Mellow. Terance Hart, M.D.             DATE OF BIRTH:  1941-08-30   DATE OF ADMISSION:  09/02/2002  DATE OF DISCHARGE:                                HISTORY & PHYSICAL   HISTORY OF PRESENT ILLNESS:  This 70 year old white female was admitted to  this facility on 08/22/02, for a cystectomy and ileal loop urinary diversion  for a bladder cancer that was muscle invasive.  She was found to have  moderate right hydronephrosis on the CT scan which were known previously,  had thickening of the bladder wall, but no distant metastatic disease.  She  was discharged on 09/01/02, but presented to the office today with nausea  and vomiting, and abdominal x-ray showed small bowel gas, but also colon  gas, and her abdomen was soft and flat and she had bowel sounds, and I  discussed her situation with Dr. Zella Richer who thought she could get off  narcotics, try sips of clear liquids at home, and see if she could resolve  this possible partial small bowel obstruction conservatively.  However,  later today she re-developed nausea and vomiting, came to the emergency  room.  She is found to be afebrile and with abdomen still somewhat soft, but  abdominal upright films again showed small bowel gas and stool in the  rectum, and air fluid levels.  She will be admitted now for IV fluids, NG  tube suction, and hopeful resolution of this current problem.   PAST MEDICAL HISTORY:  1. Bowel obstruction and dehiscence of a wound after previous partial     colectomy which turned out to be benign disease in 2001, and what she had     was a hemangioma of the colon.  2. Hypertension.   ALLERGIES:  1. CEFTIN.  2. CODEINE.  3. MORPHINE.   MEDICATIONS:  1. Altace 5 mg q.d.  2.  Hydrochlorothiazide 5 mg q.d.  3. Hydrocodone for pain.   SOCIAL HISTORY:  Past history is unchanged from previous hospitalization.   REVIEW OF SYMPTOMS:  Unchanged except as noted above.   PHYSICAL EXAMINATION:  VITAL SIGNS:  She is afebrile, pulse is 100.  GENERAL:  She is alert and oriented.  SKIN:  Warm and dry.  NECK:  Supple.  LUNGS:  No respiratory distress.  ABDOMEN:  Relatively soft.  There was slight fullness.  Ileal loop stoma bag  was noted in the right lower quadrant of the abdomen draining clear urine.   IMPRESSION:  1. Rule out small bowel obstruction.  2. Postoperative bladder carcinoma.  3. Postoperative anemia.  4. Endometriosis.  5. History of uterine fibroids.  6. Hypertension.   PLAN:  Admit for IV fluids, NG tube, and hopeful  non-surgical resolution of  her suspected partial small bowel obstruction.                                               Lucina Mellow. Terance Hart, M.D.    LJP/MEDQ  D:  09/02/2002  T:  09/03/2002  Job:  [id number]   cc:   Orson Ape. Rise Patience, M.D.  [address] [place] [address]  Fax: [phone]   Doree Albee, M.D.  [place] [address]  [place]  [place] [address]  Fax: [phone]

## 2011-02-07 NOTE — Discharge Summary (Signed)
NAMECYERA, [name]             ACCOUNT NO.:  [id number]   MEDICAL RECORD NO.:  [id number]          PATIENT TYPE:  INP   LOCATION:  0623                         FACILITY:  [facility]   PHYSICIAN:  Haywood Lasso, M.D.DATE OF BIRTH:  September 08, 1941   DATE OF ADMISSION:  10/23/2004  DATE OF DISCHARGE:  11/04/2004                                 DISCHARGE SUMMARY   FINAL DIAGNOSIS:  1.  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  2. Ventral      incisional hernia.  3. Multiple intra-abdominal adhesions.  4. Status      post total cystectomy for bladder cancer.  5. Urinary tract infection.   CLINICAL HISTORY:  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  She was admitted for elective  surgical resection.   HOSPITAL COURSE:  The patient was admitted and taken to the operating room  where a right colectomy with primary anastomosis was performed.  We did  encounter multiple intra-abdominal adhesions.  There was no evidence grossly  of metastatic disease.   Postoperatively the patient has had a relatively early benign course.  Her  abdomen but no soft with bowel sounds on the first postoperative day.  She  remained alert, oriented and reasonably comfortable but because of the  lengthy lysis of adhesions, was somewhat slow to open up.  We were able to  get her NG out on 2/6.  By the sixth postoperative day she was passing gas  and had three bowel movements and we were able to gradually increase her  diet.  She did vomit once on the seventh postoperative day but was needing  to pass gas.  She was placed on some Reglan with improvement.  She had a low-  grade fever and a chest x-ray was basically unremarkable.  A urine culture  subsequently showed staph.  By the time she was ready for discharge she was  doing well.  Her wounds seemed to be healing  and she was tolerating a solid  diet with minimal pain.   LABORATORY STUDIES:  Hemoglobin 9.4 on admission and was around 7.3  following surgery.  She did get transfused and was 9.5 prior to discharge.  She basically had a normal white count throughout her hospital stay.  The  CEA was within normal limits.   Pathology report showed an invasive adenocarcinoma with mucinous features.  There was no tumor involved in 16 pericolonic lymph nodes.  The lesion was a  T3.      CJS/MEDQ  D:  11/15/2004  T:  11/15/2004  Job:  [id number]   cc:   Rudell Cobb. [name], M.D.  [address], [place] [address]  Fax: [place]. Jeffie Pollock, M.D.  [place] [place]  [place] [address]  Fax: [phone]   Doree Albee, M.D.  [place] [address]  [place]  [place] [address]  Fax:  [phone] 

## 2011-02-07 NOTE — Op Note (Signed)
[name], SCALLAN             ACCOUNT NO.:  [id number]   MEDICAL RECORD NO.:  [id number]          PATIENT TYPE:  INP   LOCATION:  NA                           FACILITY:  [facility]   PHYSICIAN:  Haywood Lasso, M.D.DATE OF BIRTH:  01/19/1941   DATE OF PROCEDURE:  10/23/2004  DATE OF DISCHARGE:                                 OPERATIVE REPORT   CCS NUMBER:  [id number].   PREOPERATIVE DIAGNOSIS:  1.  Carcinoma of the ascending colon.  2.  Ventral incisional hernias.   POSTOPERATIVE DIAGNOSIS:  1.  Carcinoma of the ascending colon.  2.  Ventral incisional hernias.  3.  Multiple intra-abdominal adhesions.   OPERATION:  Right colectomy with lysis of adhesions and repair of ventral  incisional hernias.   SURGEON:  Haywood Lasso, M.D.   ASSISTANT:  Dr. Rise Patience.  Also scrubbed is Royce Macadamia, Engineering geologist. Ship broker.   ANESTHESIA:  General endotracheal.   CLINICAL HISTORY:  This patient is a 70 year old lady status post total  cystectomy for bladder cancer, who was recently found to have ascending  colon cancer. After discussion of the procedure, the patient has elected to  proceed to right colectomy.   DESCRIPTION OF PROCEDURE:  The patient was seen in the holding area and had  no further questions. She confirmed that we were planning to take out the  right colon.   The patient taken out room, and after satisfactory general endotracheal  anesthesia obtained, the urostomy bag was taken off and the abdomen prepped  and draped, and a sterile urostomy bag replaced. A time-out was done.   The patient's abdominal incision was made starting just below the umbilicus,  staying to the left side of it, and going up into the xiphoid area. The  abdomen was then entered through one of the thinned-out areas of fascia and  some Kochers were placed on the fascia. The fascia elevated and the small  bowel freed up, initially to the left, and then to the right. I then was  able to free it up  inferiorly so I could make the incision through the  fascia down for the length of my current incision, which went a couple of  inches below the level of her ileostomy. I freed up a little bit on the left  side so we would have good areas to close. I freed up small bowel off the  anterior abdominal wall on the left side and in the omentum anteriorly.  There was about a 2 cm defect with a well-formed sac just to the right of  midline at the level of the ileostomy, and a second smaller one just above  that; however, there is no peristomal hernia, and these were away from the  stoma itself.   Once we had this freed up, I divided a little of the omentum off the  transverse colon and then mobilized the entire hepatic flexure so we that  had the entire transverse colon and the upper part of the ascending colon up  in the wound. I used clips, cautery, and ties, as indicated. We traced  several  loops of small bowel down into the pelvis, and it was difficult at  this point to tell which loop was terminal ileum entering the pelvis and  which was coming out, and which was the loop for the bladder.   At this point, I then decided to pick a slot in the mid transverse colon to  divide, and I divided mesentery from that point down to the base of the  mesentery, and came around the ascending colon mesentery until we got down  to what was clearly the terminal ileum at the ileocecal valve. I spent about  30 or 40 minutes trying to free up adhesions into the right lower quadrant  but really was not able to clearly identify what loop was what, and I  thought that we might be running into problems, either getting into the  ileal loop blood supply, or the ureters that were coming in, and I decided  to leave this portion of the dissection be. I had enough terminal ileum, I  thought, to do an anastomosis without too much trouble, and we had good  supply to this terminal ileum.   At this point, I tacked the  antimesenteric border of the colon to the  terminal ileum at the ileocecal valve and then went just distal on the colon  and proximal ileum with another tacking suture. Both were opened, and the 60  GIA was inserted and fired. We had a nice, intact staple line that was not  bleeding.   A TA-60 was used to cut across the enclosed common defect. This was placed,  fired, and the specimen cut off.   The anastomosis was widely patent. There was no tension. It looked  completely healthy with no evidence of any ischemia, etc.  I then suture a  couple of bleeding points along the staple line.   I then tacked the colon mesentery down to the base so that there were no  places for an internal hernia. We changed gloves and irrigated to make sure  everything was dry. At this point, I excised some extraneous sac and thinned  out fascia so that we could do the closure of the abdominal cavity. We  opened both of the small hernias noted above and got the edge of fascia off  so we had clean fascia to close. Once this was done and we had the omentum  lying down over the wound, I then closed with a running #1 Novofil with  interspersed interrupted #1 Novofil.  It seemed to close nicely and no  significant tension. The wound was irrigated and closed with staples. The  patient tolerated the procedure well. There no operative complications. All  counts were correct.      CJS/MEDQ  D:  10/23/2004  T:  10/23/2004  Job:  [id number]   cc:   Ronald Lobo, M.D.  [address]  [place], [place] [address]  Fax: [phone]   Rudell Cobb. [name], M.D.  [address]  [place], [place] [address]  Fax: [phone]

## 2011-02-07 NOTE — Op Note (Signed)
[name], [name]             ACCOUNT NO.:  [id number]   MEDICAL RECORD NO.:  [id number]          PATIENT TYPE:  OIB   LOCATION:  2899                         FACILITY:  [facility]   PHYSICIAN:  Haywood Lasso, M.D.DATE OF BIRTH:  02/21/1941   DATE OF PROCEDURE:  03/10/2005  DATE OF DISCHARGE:                                 OPERATIVE REPORT   PREOP DIAGNOSIS:  Carcinoma of the colon, and inadequate venous access.   POSTOP DIAGNOSIS:  Carcinoma of the colon, and inadequate venous access.   OPERATION:  Placement of Port-A-Cath.   SURGEON:  [name].   ANESTHESIA:  MAC.   CLINICAL HISTORY:  This is a 70 year old lady getting ready to undergo  intravenous chemotherapy for her diagnosed colon cancer. She has had a prior  Port-A-Cath for bladder cancer chemo.   DESCRIPTION OF PROCEDURE:  The patient was seen in the holding area and she  had no further questions. She was taken to the operating room and after  satisfactory IV sedation, was placed in Trendelenburg position and the upper  chest and lower neck area were prepped and draped as a sterile field. The  time-out occurred.   I infiltrated 1% Xylocaine under the left infraclavicular area and entered  the subclavian vein on initial attempt. The guidewire threaded and hung up a  little bit at the junction of the superior cava and right and innominate, so  I used fluoroscopy to manipulate it into the superior vena cava.   Additional local was infiltrated over the old scar from her prior Port-A-  Cath and that scar was opened in a pocket fashion with the cautery. Using  the trocar, I brought the Port-A-Cath tubing through the subcutaneous tunnel  and into the guidewire site. The guidewire site was dilated once with the  dilator peel-away sheath, and the guidewire and dilator removed, and a  catheter threaded easily to a distance of about 25 cm. The peel-away sheath  was removed. The catheter aspirated and irrigated  easily.   On fluoroscopy, it appeared that the catheter had curled up in the area of  the superior vena cava and innominate vein. I tried several attempts to  manipulate this under fluoroscopy but was initially unsuccessful. The  catheter was then pulled out of its tunnel and the guidewire replaced. With  the guidewire in place and under fluoroscopy, I was then able to manipulate  the guidewire and catheter into the right atrium, remove the guidewire, and  back the catheter up into the superior vena cava where it appeared to be in  good position. Again, it aspirated and irrigated easily.   The catheter was brought back through the tunnel. The reservoir was flushed  and attached, and locking mechanism engaged. The catheter aspirated and  irrigated nicely. A final fluoroscopy showed good positioning of the tip and  no kinks.   The incision was closed with 3-0 Vicryl, 4-0 Monocryl, and Steri-Strips. A  right angle Huber point needle was attached and flushed once with dilute  heparin, followed by concentrated aqueous heparin. Sterile dressings were  applied. The patient tolerated the  procedure well. There were no operative  complications. All counts were correct.       CJS/MEDQ  D:  03/10/2005  T:  03/10/2005  Job:  [id number]

## 2011-02-07 NOTE — Op Note (Signed)
NAME:  [name], [name]                       ACCOUNT NO.:  [id number]   MEDICAL RECORD NO.:  [id number]                   PATIENT TYPE:  AMB   LOCATION:  DSC                                  FACILITY:  [facility]   PHYSICIAN:  Haywood Lasso, M.D.           DATE OF BIRTH:  January 09, 1941   DATE OF PROCEDURE:  10/10/2002  DATE OF DISCHARGE:                                 OPERATIVE REPORT   PREOPERATIVE DIAGNOSIS:  1. Bladder cancer.  2. Inadequate venous access for chemotherapy.   POSTOPERATIVE DIAGNOSIS:  1. Bladder cancer.  2. Inadequate venous access for chemotherapy.   OPERATION PERFORMED:  Placement of Port-A-Cath.   SURGEON:  Haywood Lasso, M.D.   ANESTHESIA:  MAC.   INDICATIONS FOR PROCEDURE:  The patient is a 70 year old getting ready to  embark on chemotherapy for her bladder cancer.  She has in adequate venous  access.   DESCRIPTION OF PROCEDURE:  The patient was seen in the holding area and had  no further questions.  I had not spoken with her previously about this but  she had been counseled at the cancer center.  I did go over the details of  the procedure, risks and complications including bleeding, infection,  pneumothorax, etc.  All questions were answered.  She asked specifically  that we not leave it accessed even though her chemotherapy was three days  from now.   The patient was taken to the operating room and after satisfactory IV  sedation, had the lower neck and upper chest prepped and draped as a sterile  field.  1% Xylocaine was infiltrated in the left infraclavicular area and  the left subclavian vein entered on the initial attempt.  The guidewire was  threaded easily and fluoroscopy showed it to be in the superior vena cava.  Additional local was infiltrated over the anterior chest wall and transverse  incision made and using cautery, a pocket made for the reservoir.   A trocar was used to bring the catheter from the reservoir site into  the  guidewire site.  This was flushed and then the guidewire tract dilated once  with the X-port kit and this went easily.  The dilator and the guidewire  were removed and the catheter threaded easily through the sheath and the  sheath was then removed.  The catheter aspirated and irrigated easily.  It  was at 25 cm and using fluoroscopy, this appeared to be a little too far in,  so I backed up to 20 cm and under fluoroscopy this appeared to be in the  distal superior vena cava.   The reservoir was flushed and attached and the locking mechanism engaged.  This aspirated and irrigated easily.  It was sutured down with some 2-0  Prolenes.  Final check for position was made with fluoroscopy and we  appeared to have no kinks or sharp angulations of the tubing and the tip  appeared  to be in good position.  The reservoir was flushed one more time  with dilute heparin followed by 5 cc of concentrated aqueous heparin.   The incision was then closed with 3-0 Vicryl followed by 4-0 Monocryl  subcuticular plus Steri-Strips.  The patient tolerated the procedure well.  There were no operative complications and all counts were correct.                                               Haywood Lasso, M.D.    CJS/MEDQ  D:  10/10/2002  T:  10/10/2002  Job:  [id number]   cc:   Doree Albee, M.D.  [place] [address]  [place]  [place] [address]  Fax: [place] Jeffie Pollock, M.D.  [address] [place]  [place] [address]  Fax: [place]

## 2011-02-07 NOTE — Discharge Summary (Signed)
[place]  Patient:    [name], [name]                    MRN: [id number] Adm. Date:  [id number] Disc. Date: [id number] Attending:  Erick Blinks CC:         Hurshel Party, M.D.  Nelwyn Salisbury, M.D.   Discharge Summary  PRINCIPAL DISCHARGE DIAGNOSIS:  Tubulovillous adenoma of the sigmoid colon.  SECONDARY DIAGNOSES: 1. Fascial dehiscence. 2. Intra-abdominal abscess with anastomotic leak. 3. Hypertension. 4. Malnutrition. 5. Hypokalemia. 6. Acute blood loss anemia.  PROCEDURES: 1. Left colectomy on June 26, 2000. 2. Exploratory laparotomy with intra-abdominal abscess drainage, resection    of the anastomosis, colostomy, and abdominal closure on July 05, 2000.  REASON FOR ADMISSION:  [name] is a 70 year old female with rectal bleeding.  She was noted to have a polypoid lesion 35 cm from the anal verge which was removed by Dr. Collene Mares, was biopsied, and noted to be benign adenoma. However, Dr. Collene Mares was not able to entirely remove the polyp; and for that reason, she was admitted for elective operation.  HOSPITAL COURSE:  On June 26, 2000, she underwent the left colectomy. Postoperatively, she had some numbness in her right leg secondary to the epidural.  She also had some hypokalemia with a potassium of 2.8, and this was corrected.  She had the nasogastric tube removed.  She had no nausea and no flatus.  She then began having some nausea on the third postoperative day and had difficulty walking because of right lower extremity weakness.  Pathology was benign demonstrating diverticulosis and a large thrombus at a previous biopsy site.  It was felt she had a postoperative ileus and was tried on Reglan.  Nausea persisted, but she began having bowel movements.  She subsequently was able to take a diet but began having fever and some drainage from her wound.  The wound was opened up, and a wound infection was  drained. She still had some hypokalemia, and this was actively being treated.  On July 05, 2000, she was coughing, felt a pop, and had acute abdominal pain.  Dehiscence with some evisceration was noted.  She subsequently was taken to the operating room on July 05, 2000, in the morning where she was noted to have intra-abdominal abscess and leak from the anastomosis.  She underwent resection of the anastomosis, drainage of the abscess, and colostomy.  She also had a left subclavian central line placed at that time.  She was then placed on broad-spectrum antibiotics and started on TNA.  She had a slow postoperative course following that.  She had an ileus which slowly resolved as the colostomy started working.  She had an open midline wound which was treated with dressing changes.  She remained on broad-spectrum aggressive antibiotics.  She was taking better oral intake and thus had her central line and TNA stopped.  She continued to have a mild elevation in white blood cell count which slowly was coming down.  A CT of the abdomen was performed which found a persistent left gutter fluid collection.  This was an intra-abdominal recurrent abscess, and once this was drained, she seemed to do better.  She still needed to have some long-term IV antibiotics.  Infectious disease consultation was obtained because she still had some mild fever and elevation of her white blood cell count.  This seemed to improve with conservative management including IV antibiotics.  She did have central line with a positive culture and was started on vancomycin.  At this time, she was on vancomycin, Primaxin, and Diflucan.  The abscess drainage catheters were decreased and removed on October 29 and October 30.  She continued to show some slow improvement.  She was switched over to oral antibiotics.  On July 23, 2000, she was feeling much better. Her wound was healing nicely.  She was tolerating oral  antibiotics.  Her white blood cell count was 7000.  She did have anemia with hemoglobin 8.6.  She was able to be discharged.  DISPOSITION:  Discharged to home on July 23, 2000.  DISCHARGE MEDICATIONS: Include Diflucan.  She is to resume her home medicines and take Tylenol for pain.  WOUND CARE:  Home health nurses will help her change her abdominal dressing.  FOLLOWUP:  I will see her back in the office one week after discharge. DD:  09/24/00 TD:  09/24/00 Job: [id number]

## 2011-02-07 NOTE — Discharge Summary (Signed)
NAME:  [name], [name]                       ACCOUNT NO.:  [id number]   MEDICAL RECORD NO.:  [id number]                   PATIENT TYPE:  INP   LOCATION:  6659                                 FACILITY:  [facility]   PHYSICIAN:  Rudell Cobb. [name], M.D.              DATE OF BIRTH:  [birth year]   DATE OF ADMISSION:  12/20/2002  DATE OF DISCHARGE:  12/23/2002                                 DISCHARGE SUMMARY   DIAGNOSES ON DISCHARGE:  1. Enterococcus urinary tract infection.  2. Urosepsis.  3. Nausea and vomiting.  4. Dehydration.  5. Stage III bladder cancer.  6. Hypertension.   CONDITION ON DISCHARGE:  Stable.   ACTIVITY:  As tolerated.   DIET:  No restrictions.   FOLLOW UP:  The patient will come to the office next week for her final dose  of chemotherapy.  I will also see the patient back in the office in about  three weeks.   MEDICATIONS ON DISCHARGE:  1. Lexapro 10 mg p.o. daily.  2. Altace 5 mg p.o. daily.  3. Coumadin 1 mg p.o. daily.  4. Levaquin 500 mg p.o. daily x 4 days.  5. K-Dur 20 mEq p.o. b.i.d. x 4 days.   HOSPITAL COURSE:  [name] was admitted from the office.  She came in  with nausea and vomiting.  She had weakness.  She is quite anemic.  She has  some fever and chills.  She had cultures taken on admission.  She was  growing Enterococcus from the urine.  She was placed on IV ciprofloxacin.  She was also placed on IV vancomycin.  She defervesced immediately.   She was profoundly anemic on admission.  Her hemoglobin was 7.6.  We went  ahead and transfused her 12/21/02.  Her hemoglobin went up to 10.3.  She was  slightly thrombocytopenic.  Her platelet count upon discharge was 76,000.  She was not neutropenic during the hospital stay.   We had to replace her potassium while in the hospital.  She received IV  potassium.  Her potassium was 2.9 upon discharge.  We did go ahead and give  her additional runs of potassium.  I am also going to put her on oral  potassium at home.   Her appetite was slowly improved.  She did not have any diarrhea during the  hospital stay.  She had a little nausea but no vomiting.  She remained  afebrile.  She had no cough or shortness of breath.   We were subsequently able to discharge her to home.   PHYSICAL EXAMINATION UPON DISCHARGE:  GENERAL:  Well-developed, well-  nourished, white female, in no obvious distress.  VITAL SIGNS:  Temperature 97.6, pulse 72, respiratory rate 18, blood  pressure 150/72.  HEAD AND NECK:  Normocephalic, atraumatic skull.  There are no ocular or  oral lesions.  There are no palpable cervical or supraclavicular lymph  nodes.  LUNGS:  Clear to percussion and auscultation bilaterally.  CARDIAC:  Regular rate and rhythm with a normal S1 and S2.  No murmurs,  rubs, or bruits.  ABDOMEN:  Soft with good bowel sounds.  She has a urostomy.  There is no  hepatosplenomegaly.  EXTREMITIES:  No cyanosis, clubbing, or edema.  NEUROLOGIC:  No focal neurological deficits.                                               Rudell Cobb. [name], M.D.    PRE/MEDQ  D:  12/23/2002  T:  12/24/2002  Job:  [id number]

## 2011-02-07 NOTE — Consult Note (Signed)
NAME:  [name], [name]                       ACCOUNT NO.:  [id number]   MEDICAL RECORD NO.:  [id number]                   PATIENT TYPE:  INP   LOCATION:  6270                                 FACILITY:  [facility]   PHYSICIAN:  Rudell Cobb. [name], M.D.              DATE OF BIRTH:  04/24/1941   DATE OF CONSULTATION:  08/26/2002  DATE OF DISCHARGE:                                   CONSULTATION   HISTORY OF PRESENT ILLNESS:  This is a 70 year old female, status post  radical cystectomy with ileal conduit, after an intermittent history of  hematuria over six days.  Pathology report revealed high-grade papillary  urothelial carcinoma on July 21, 2002 ([id number]).  At that time there  was invasion noted in the muscularis propria.  Surgery was performed on  August 22, 2002, and revealed a PT2B, PN1, PMX transitional cell carcinoma  into the outer one-half of the muscular propria.  The margins were free of  high-grade tumor, just one of 16 lymph nodes were found to be positive.   PAST MEDICAL HISTORY:  1. Hypertension.  2. Cholecystectomy.  3. Osteoarthritis.  4. Colon resection secondary to hemangioma with dehiscence after the     surgery.  There was also a temporary colostomy with reversal in June     2002.   GYNECOLOGICAL HISTORY:  G4, P4, all vaginal deliveries, and a tubal ligation  followed.   ALLERGIES:  CEFTIN causes hives.  CODEINE causes dyspnea.  MORPHINE causes  vomiting and hives.   CURRENT MEDICATIONS:  1. Altace 5 mg 1 p.o. q.d.  2. HCTZ 25 mg 1 p.o. q.d.   SOCIAL HISTORY:  The patient lives with her husband, Mariea Clonts, just inside the  [facility].  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  She has four  children, ages 56 through 51, all are college educated and live in [place].  She was a positive smoker for at least 50 pack years, and she  quit approximately 10 years ago.   FAMILY HISTORY:  Her mother is alive at 12.  She has survived breast cancer.  She  also has osteoarthritis.  Her father died of complications of  gallbladder surgery at the age of 72.  She has two sisters, one who has had  heart valve replacement and one alive and well.  She has three brothers, one  with gout, one has had a kidney removed, but she is unsure why, and one has  general poor health.   REVIEW OF SYSTEMS:  She denies any headache.  No dyspnea.  She has had  indigestion with nausea and vomiting.  She still has her nasogastric tube in  but clamped.  She denies diarrhea or constipation.  She has had increased  fatigue and weakness as well as night sweats since her colon surgery.  She  also has had some urgency and dysuria over the past several years and was  taking a bladder medication she does not know the name of in the past when  she had difficulty.  She did, apparently, have a cystoscopy possibly about  five years ago when she had some what she calls tingling in her bladder.  This was done by Dr. Jeffie Pollock.   PHYSICAL EXAMINATION:  GENERAL:  The patient is a 70 year old white female  looking her stated age.  She is obese, and she becomes occasionally tearful.  HEENT:  Normocephalic, atraumatic.  EOMs are intact.  PERRLA.  Sclerae  anicteric.  NECK:  Trachea is midline, and neck is supple.  NODES:  There are no cervical, axillary, or inguinal nodes appreciated.  LUNGS:  Bilateral wheeze and rhonchi.  CARDIOVASCULAR:  Regular rate and rhythm.  No murmur, no gallop.  ABDOMEN:  Obese, with diffuse tenderness and an ileostomy and an ileal  conduit in the right upper quadrant with stent still in.  There is no mass,  no organomegaly appreciated.  EXTREMITIES:  No cyanosis, clubbing, or edema.  NEUROLOGIC:  Alert and oriented x 3.  Cranial nerves II-XII intact.  DTRs +  bilaterally.  Lower extremities not assessed.  Strength is 5/5 throughout.   LABORATORY DATA:  WBC 7.1, hemoglobin 12.3, hematocrit 36.2, platelets  260,000.  Sodium 140, potassium 2.7, chloride 102,  CO2 30, glucose 151, BUN  11, creatinine 0.7, calcium 9.1.  Total protein 6.6, albumin 3.4.   ASSESSMENT AND PLAN:  This is a 70 year old white female, status post  radical cystectomy on August 22, 2002, with her nasogastric tube still in  place, and she is n.p.o. except for ice.  She was seen and examined by Dr.  Burney Gauze, and we will plan to see her in the office on September 25, 2002,  to further discuss chemotherapy.  Dr. [name] notes a nice 70 year old  female who could benefit from chemotherapy.  A randomized study revealed  benefits from adjunctive chemotherapy for one positive node with bladder  cancer.  She also has good performance status and will plan to use cisplatin  and Gemzar x 4 cycles.  Will probably start in approximately one month.  The  patient had multiple questions and admits that she often, since taking the  pain medication, will forget the answers to the question within a few  minutes.  We will plan, again, to see her in the office in approximately one  month.     Kandis Cocking, N.P.                      Rudell Cobb. [name], M.D.    Geralynn Rile  D:  08/26/2002  T:  08/27/2002  Job:  [id number]   cc:   Marshall Cork. Jeffie Pollock, M.D.  [address], [place] [address]  Fax: [phone]   Doree Albee, M.D.  [place] [address]  [place]  [place] [address]  Fax: [place]. [name], M.D.  [address]  [place], [place] [address]  Fax: [phone]

## 2011-02-07 NOTE — Discharge Summary (Signed)
NAME:  [name], [name]                       ACCOUNT NO.:  [id number]   MEDICAL RECORD NO.:  [id number]                   PATIENT TYPE:  INP   LOCATION:  1791                                 FACILITY:  [facility]   PHYSICIAN:  Marshall Cork. Jeffie Pollock, M.D.                 DATE OF BIRTH:  [birth year]   DATE OF ADMISSION:  08/22/2002  DATE OF DISCHARGE:  09/01/2002                                 DISCHARGE SUMMARY   HISTORY OF PRESENT ILLNESS:  The patient is a 70 year old white female who  developed gross hematuria.  She was found to have bladder cancer on  preoperative evaluation.  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   ALLERGIES:  CEFTIN, CODEINE, MORPHINE.   CURRENT MEDICATIONS:  1. Altace 5 mg daily.  2. Hydrochlorothiazide 5 mg daily.   PAST MEDICAL HISTORY:  High blood pressure.   PAST SURGICAL HISTORY:  1. Colon resection for hemangioma in October 2001.  She had an anastomotic     leak and wound dehiscence requiring repair and ended up having a     colostomy which was eventually reversed in June 2002 and then she had a     small bowel obstruction in December 2002.  2. She has had a tubal ligation.  Gravida 4, para 4 with normal vaginal     deliveries.   SOCIAL HISTORY:  She is a pack a day smoker for 20 years.  Quit in 1998.  Denies alcohol.   FAMILY HISTORY:  Unremarkable.   REVIEW OF SYSTEMS:  She has had some headaches, hematuria, swelling in the  right foot following recent injury, urinary frequency, and incontinence.  She is otherwise without complaints.   For additional details and physical examination please see the attached  office note.   ACCESSORY CLINICAL INFORMATION:  EKG:  Normal sinus rhythm with sinus  arrhythmia and incomplete right bundle branch block.  Admission  laboratories:  White count 7.1, hemoglobin 12.4.   Chemistries within normal  limits with the exception of a low potassium 2.7 and glucose 151, albumin  3.4.  Hemoglobin A1C 5.6.  She is A+, antibody negative.   HOSPITAL COURSE:  The night before admission the patient underwent a home  mechanical and antibiotic bowel prep.  On the day of admission she was taken  to the operating room where she underwent a radical cystectomy with ileal  conduit urinary diversion.  She had extensive small bowel adhesions that  were taken down by Haywood Lasso, M.D.  Separate operative note was  dictated for that portion of the procedure.  Postoperatively she was left  with bilateral single J stents and a Blake drain.  On the evening of surgery  she was doing well with a pink stoma, hemoglobin of 11.6.  Glucose was  slightly elevated at 168.  Electrolytes were unremarkable.  On the first  postoperative day patient was anxious.  She was otherwise without  complaints.  She was afebrile with stable vitals.  The wound was intact.  She was having problems with stoma appliance leaking.  Her JP put out 90 mL  in eight hours.  Hemoglobin 11, white count 9.6.  Electrolytes were normal.  Her IV fluids were decreased.  Stoma therapist was consulted.  She was  transferred from the ICU to the floor.  Medicine follow-up was also obtained  by Dr. Anastasio Champion.  Second postoperative day she continued to do well without  complaints.  Tmax of 100.8.  On examination she was still having some  leaking of the stoma appliance.  She was having positive bowel sounds.  JP  had minimal output.  Hemoglobin was 11,000, white count 9.4.  Glucose was  116.  Electrolytes were normal.  Hemoglobin A1C was 5.6.  Chest x-ray was  obtained.  NG tube which was placed at the time of surgery was left in at  this point.  She did have some hypokalemia which was managed with  intravenous supplementation.  On the third postoperative day she was without  major complaints other than persistent problem  with leaking stoma.  Tmax was  101.  She had positive bowel sounds, but had no flatus or BM.  Potassium was  3.1, glucose 148.  With the fever she was encouraged to use the incentive  spirometer.  She was given Dulcolax suppository.  Additional potassium  supplementation was given.  On December 4 her pathology returned.  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  Oncology consult was obtained.  On the  fourth postoperative day her abdomen was soft and flat with positive bowel  sounds.  Her hemoglobin was 8.6.  Her NG was clamped.  She was given  Dulcolax suppository.  There was no sign of active bleeding.  Follow-up of  her hemoglobin was planned due to the anemia.  Rudell Cobb. [name], M.D. saw  the patient in consultation for oncology and will arrange postoperative  follow-up.  In the evening of December 5 her NG was removed and she was  allowed sips and chips.  On the fifth postoperative day Tmax was 100.3.  She  had a bowel movement.  She was increased to clear liquids and NG had been  removed.  On the sixth postoperative day her hemoglobin was 9.9, potassium  3.4, creatinine 0.7.  Her diet was advanced.  Tmax was 100.4.  On the  seventh postoperative day she was having some mouth and vaginal itching  likely secondary to yeast.  She had had a bowel movement.  No nausea and  vomiting.  Wound and stoma were intact.  Hemoglobin was 8.9.  Potassium was  2.9.  Her IV was discontinued along with her PCA.  Her staples were removed.  She was started on K-Dur 20 mg b.i.d.  Her diet was increased.  On December  8 her central line was removed.  Later that day there was some bloody  drainage noted from the incision.  On December 9 she had had another bowel  movement.  Potassium was 3.1.  On examination there was about a 4 cm wound  separation in the superior portion of the incision with a small clot without evidence of  infection.  There was no evidence of dehiscence.  Stoma was  pink.  The stents were in place.  She was started on saline soaked dressing  changes and her discharge was delayed at this point and her hypertensives  were restarted with Altace.  On the ninth postoperative day she was having a  little back and abdominal pain she felt was secondary to activity.  The  wound appeared to be beginning to granulate.  Vioxx was added for pain.  She  had been started on iron supplementation earlier for her anemia.  On the  tenth postoperative day she was felt to be ready for discharge.  Her wound  has been granulating well and she was capable of wound and dressing changes.  Her potassium was up to 4.8.  She was discharged with a final diagnosis of  invasive bladder cancer with no metastasis, negative stage II 2B N1 M0.  Complications during this admission included wound separation, hypokalemia,  and acute blood loss anemia.   DISPOSITION:  Home.   CONDITION ON DISCHARGE:  Improved.   PROGNOSIS:  Fair.   DISCHARGE INSTRUCTIONS:  She was instructed to follow up with me in one  week.  Rudell Cobb. [name], M.D. will make an appointment for his follow-up.  She was to resume her home medications.  She was given a prescription for  Vicodin for pain.  Home health consultation was obtained for daily dressing  changes.                                               Marshall Cork. Jeffie Pollock, M.D.    JJW/MEDQ  D:  11/29/2002  T:  11/29/2002  Job:  [id number]

## 2011-02-07 NOTE — Procedures (Signed)
[name], [name]             ACCOUNT NO.:  [id number]   MEDICAL RECORD NO.:  [id number]          PATIENT TYPE:  INP   LOCATION:  6546                         FACILITY:  [facility]   PHYSICIAN:  Ninetta Lights, M.D. DATE OF BIRTH:  [birth year]   DATE OF PROCEDURE:  DATE OF DISCHARGE:  02/24/2010                    PERIPHERAL VASCULAR INVASIVE PROCEDURE   FINAL DIAGNOSES:  1. 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  2. Hypertension.  3. History of colon and bladder cancer.  4. Depression.  5. Thrombocytopenia.  6. Memory loss.  7. Cirrhosis.  8. Esophageal varices.  9. NASH syndrome.   HISTORY OF PRESENT ILLNESS:  A 70 year old female with history of end-  stage degenerative joint disease left knee and chronic pain, presented  to our office for preop evaluation for total knee replacement.  She had  progressively worsening pain with failed response with conservative  treatment.  She had significant decrease in daily activities due to the  ongoing complaint.   HOSPITAL COURSE:  On February 20, 2010, the patient was taken to the [facility] and a left total knee replacement procedure performed.  Surgeon  Kathryne Hitch, MD and assistant Benjiman Core, PA-C.  Anesthesia general.  No specimens.  EBL 200 mL.  Tourniquet time 83 minutes.  One Hemovac  drain placed  there were no surgical or anesthesia complications.  The  patient was transferred to recovery in stable condition.  February 21, 2010,  patient doing well with good pain control.  Vital signs stable,  afebrile.  Hemoglobin 9.9.  Lovenox started for DVT prophylaxis.  Dressing clean, dry and intact.  Calf nontender.  Neurovascularly  intact.  Skin warm and dry.  Discontinued PCA.  PT/OT consults.  February 22, 2010, patient doing well with good pain control.  Temperature 99.4,  pulse 80, respirations 20, blood pressure 127/65.  Hemoglobin 8.9, preop  hemoglobin 12.4.  Sodium 141,  potassium 4.3, chloride 109, CO2 30, BUN  11, creatinine 0.58, glucose 122.  Wound looks good.  Staples intact.  No drainage or signs of infection.  Hemovac drain removed.  Calf  nontender.  Neurovascularly intact.  Repeat H and H due to acute blood  loss anemia.  Saline locked IV.  February 23, 2010, patient doing well.  Good  pain control.  Hemoglobin 8.6.  Knee wound looks good.  Staples intact.  No drainage or signs of infection.  On February 24, 2010, patient doing well.  Progressed great with therapy.  Pain controlled.  Hemoglobin 8.1.  Acute  blood loss anemia asymptomatic.  She is ready for discharge home.   DISCHARGE MEDICATIONS:  1. Norco 5/325 one to two tablets p.o. q.4-6 hours pain.  2. Robaxin 500 mg one tablet p.o. q.6 hours p.r.n. spasms.  3. Lovenox 30 mg one subcu injection q.12 hours x4 weeks postop for      DVT prophylaxis.  4. Resume previous home medications.   DISPOSITION:  Discharged home.   CONDITION ON DISCHARGE:  Good and stable.   DISCHARGE INSTRUCTIONS:  Patient to work with home health PT/OT  to  improve ambulation, knee range of motion and strengthening.  Weightbearing as tolerated.  Daily dressing change with 4 x 4 gauze and  tape.  Lovenox x4 weeks postop for DVT prophylaxis.  Follow up in the  office when 2 weeks postop for recheck and staple removal.  Return  sooner if needed.      Alyson Locket. Velora Heckler.    ______________________________  Ninetta Lights, M.D.    JMO/MEDQ  D:  04/09/2010  T:  04/10/2010  Job:  [id number]   Electronically Signed by Kathryne Hitch M.D. on 05/23/2010 04:18:32 PM

## 2011-02-07 NOTE — Op Note (Signed)
NAME:  [name], [name]                       ACCOUNT NO.:  [id number]   MEDICAL RECORD NO.:  [id number]                   PATIENT TYPE:  INP   LOCATION:  0002                                 FACILITY:  [facility]   PHYSICIAN:  Haywood Lasso, M.D.           DATE OF BIRTH:  12/20/1940   DATE OF PROCEDURE:  08/22/2002  DATE OF DISCHARGE:                                 OPERATIVE REPORT   PREOPERATIVE DIAGNOSES:  1. Cancer of the bladder.  2. Severe intra-abdominal adhesions.   POSTOPERATIVE DIAGNOSIS:  1. Cancer of the bladder.  2. Severe intra-abdominal adhesions.  3. Ventral incisional hernia.   OPERATION:  Lysis of adhesions.   SURGEON:  Haywood Lasso, M.D.   CLINICAL HISTORY:  Ms. Mclamb patient is a 70 year old, who has had  multiple abdominal procedures including a partial colectomy followed by  reexploration for apparent leak with colostomy followed eventually by  takedown of the colostomy.  She has now developed bladder cancer and Marshall Cork.  Jeffie Pollock, M.D. was planning total cystectomy and needed assistance dealing with  adhesions.   DESCRIPTION OF PROCEDURE:  The patient had been previously seen in the  office, and John J. Jeffie Pollock, M.D. went ahead and got started.  I scrubbed in  after he completed the cystectomy, etc.  At this point, the abdomen was  opened from the midline down.  The majority of the small bowel was gnarled  with adhesions and still stuck somewhat to the anterior abdominal wall above  the umbilicus.  I really could not get these out with this incision, so we  extended the incision to just above the umbilicus.   With Dr. Ralene Muskrat assistance, I then engaged in an extensive enterolysis,  beginning wherever I could get free bowel and taking down adhesions and  freeing the anterior abdominal wall from the small bowel, freeing the pelvis  out of the small bowel that were still stuck together and eventually freeing  up the entire small bowel for its  length.  At the onset, I noticed one small  area of deserosalization which appeared to have occurred right at where the  small bowel was stuck to the anterior abdominal wall, but this was very  superficial, and I repaired with a couple of 3-0 silks.  This may have been  simply due to some traction by the previously-placed retractor.   Once the adhesions had been completely lysed, I then ran the small bowel  from terminal ileum to the ligament of Treitz to be sure there were no kinks  or other adhesions left and to be sure there were no bowel injuries other  than the aforementioned one which was noted at the onset.   The anterior abdominal wall was now freed up so that there was a good place  for Dr. Jeffie Pollock to bring his ileal loop up, and I scrubbed for Dr. Jeffie Pollock to  complete creation of the ileal  loop, etc.   The patient tolerated my portion of the procedure well.  Estimated blood  loss from my portion was less than 10 cc.  No operative complications were  noted.                                               Haywood Lasso, M.D.    CJS/MEDQ  D:  08/22/2002  T:  08/22/2002  Job:  [id number]   cc:   Marshall Cork. Jeffie Pollock, M.D.

## 2011-02-07 NOTE — Op Note (Signed)
[name], [name]             ACCOUNT NO.:  [id number]   MEDICAL RECORD NO.:  [id number]          PATIENT TYPE:  AMB   LOCATION:  DSC                          FACILITY:  [facility]   PHYSICIAN:  Haywood Lasso, M.D.DATE OF BIRTH:  [birth year]   DATE OF PROCEDURE:  09/10/2006  DATE OF DISCHARGE:                               OPERATIVE REPORT   PREOPERATIVE DIAGNOSIS:  Unneeded Port-A-Cath.   POSTOPERATIVE DIAGNOSIS:  Unneeded Port-A-Cath.   OPERATION:  Port-A-Cath removal.   SURGEON:  Haywood Lasso, M.D.   ANESTHESIA:  Local (1% Xylocaine with epi 10 mL)   CLINICAL HISTORY:  Ms. Branscum has completed her chemo and her  oncologist has asked that we remove the Port-A-Cath.   DESCRIPTION OF PROCEDURE:  The patient was seen in the minor procedure  room and confirmed that Port-A-Cath removal was the planned procedure.  The area of the Port-A-Cath was identified and anesthetized with 1%  Xylocaine with epi.  I waited ten minutes.  The area was then prepped  and draped.  The old scar was opened.  The Port-A-Cath was identified.  The tubing was backed out of the venous side and a suture of 3-0 Vicryl  used to close the tract.  Then, using the knife, the port was removed  from its capsule.  There was essentially no bleeding.  The incision was  closed with 3-0 Vicryl followed by Dermabond.  The patient tolerated the  procedure well and there no complications.      Haywood Lasso, M.D.  Electronically Signed     CJS/MEDQ  D:  09/10/2006  T:  09/10/2006  Job:  [id number]

## 2011-02-07 NOTE — Op Note (Signed)
NAME:  [name], [name]                       ACCOUNT NO.:  [id number]   MEDICAL RECORD NO.:  [id number]                   PATIENT TYPE:  AMB   LOCATION:  DAY                                  FACILITY:  [facility]   PHYSICIAN:  Marshall Cork. Jeffie Pollock, M.D.                 DATE OF BIRTH:  Feb 05, 1941   DATE OF PROCEDURE:  07/21/2002  DATE OF DISCHARGE:                                 OPERATIVE REPORT   PREOPERATIVE DIAGNOSIS:  Bladder tumors.   POSTOPERATIVE DIAGNOSIS:  Bladder tumors.   PROCEDURE:  1. Cystoscopy.  2. Transurethral resection of large bladder tumor.   SURGEON:  Marshall Cork. Jeffie Pollock, M.D.   ASSISTANT:  Arlean Hopping, M.D.   ESTIMATED BLOOD LOSS:  Minimal.   DRAINS:  Foley catheter.   COMPLICATIONS:  None.   FINDINGS:  Cystoscopy with the 30 and 70 degree lenses demonstrated multiple  bladder tumors of varying sizes.  The largest papillary tumor was located at  the right bladder neck, and was approximately 3 cm in largest diameter.  Additionally, there was a sessile, more invasive-appearing tumor over the  right hemitrigone.  The right ureteral orifice was never identified.  There  were also two approximately 1 cm papillary-appearing tumors as well as some  CIS-appearing tissue at the left posterior bladder.  Additionally, there was  a small papillary tumor at the left dome.  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  The  left ureteral orifice was identified and was not resected.   BRIEF HISTORY:  [name] is a 70 year old white female, who is a smoker,  who recently presented with gross hematuria.  Imaging studies demonstrated  multiple filling defects within the bladder, consistent with bladder tumors.  She was therefore consented for transurethral resection of bladder tumor.  Therefore, have agreed today for this procedure.   DESCRIPTION OF PROCEDURE:  Following administration of antibiotics and  anesthesia, [name] was then prepped and draped in the dorsal lithotomy  position.  Standard cystoscopy was performed using 22 French sheath, 30 and  70 degree lenses.  Findings are as above.  Following cystoscopy, a 59 French  resectoscope sheath was placed using the obturator.  Tumors were  systematically resected using the cutting current, and smaller tumors were  fulgurated using coagulation current.  The area of the right hemitrigone was  resected on pure cutting current in an attempt to locate the right ureteral  orifice which was not seen.  All specimens were evacuated from the bladder  using  an irrigating syringe.  The bladder was inspected for any sites of bleeding.  Any bleeding sites were fulgurated.  A Foley catheter was then placed.  Efflux from the Foley catheter was clear.   DISPOSITION:  The patient was taken to the recovery room in stable  condition.     Arlean Hopping, M.D.  Marshall Cork. Jeffie Pollock, M.D.    DK/MEDQ  D:  07/21/2002  T:  07/21/2002  Job:  [id number]

## 2011-02-07 NOTE — Op Note (Signed)
[name], LICKLIDER             ACCOUNT NO.:  [id number]   MEDICAL RECORD NO.:  [id number]          PATIENT TYPE:  AMB   LOCATION:  ENDO                         FACILITY:  [facility]   PHYSICIAN:  Ronald Lobo, M.D.   DATE OF BIRTH:  04-20-1941   DATE OF PROCEDURE:  09/30/2004  DATE OF DISCHARGE:                                 OPERATIVE REPORT   PROCEDURE:  Colonoscopy with biopsies.   SURGEON:   INDICATIONS FOR PROCEDURE:  This is a 71 year old female with heme-positive  stool and a negative endoscopy.  She is several years status post a left  hemicolectomy performed to confirm adequate excision of an adenomatous polyp  removed by another gastroenterologist.  A recent barium enema has shown  evidence of a mass in the ascending colon.   FINDINGS:  A fairly large extensive ulcerated lesion in the ascending colon  a short distance above the cecum.   DESCRIPTION OF PROCEDURE:  The nature, purpose and risks of the procedure  had been reviewed with the patient and she provided written consent.  Sedation for this procedure and the upper endoscopy which preceded it  totalled Fentanyl 75 mcg and Versed 8 mg IV without arrhythmias or  desaturation.  The Olympus adult video colonoscope was advanced without  difficulty around the colon to the cecum.  This was identified by clear  visualization of the appendiceal orifice and pullback was then performed.   A short distance above the cecum, perhaps 8 cm or so, there was a  semicircumferencial ulcerated mass probably extending about 8 cm.  Although  it was not particularly bulky, it had the appearance of a malignant  neoplasm.  Several biopsies of it were obtained.  Also, SPOT tattooing was  performed at the proximal and distal margins of this lesion.  The scope was  then withdrawn further around the colon without additional abnormalities  being identified such as other masses, polyps, colitis or diverticular  disease.  Retroflexion in the  rectum was unremarkable.   The patient tolerated the procedure well and there were no apparent  complications.   IMPRESSION:  Ulcerated mass in the proximal colon corresponding to the site  of the abnormal barium enema (793.4).   PLAN:  Await pathology results.      RB/MEDQ  D:  09/30/2004  T:  09/30/2004  Job:  [id number]   cc:   Rudell [place]. [name], M.D.  [address], [place] [address]  Fax: [place]. Jeffie Pollock, M.D.  [place]. [address], [address] [place]  [place] [address]  Fax: [phone]   Odis Hollingshead, M.D.  [address] [place]  [place] [address]  Fax: [phone]

## 2011-02-07 NOTE — Op Note (Signed)
[place]  Patient:    [name], [name]                    MRN: [id number] Proc. Date: 07/05/00 Adm. Date:  [id number] Attending:  Erick Blinks CC:         Odis Hollingshead, M.D.   Operative Report  PREOPERATIVE DIAGNOSES:  Fascial dehiscence.  POSTOPERATIVE DIAGNOSES:  Fascial dehiscence, intra-abdominal abscess, anastomotic leak.  PROCEDURE:  Exploratory laparotomy, drainage of intra-abdominal abscess, splenic flexure mobilization and resection of anastomosis, end colostomy and Hartmann procedure. Placement of central venous catheter.  SURGEON:  Janeece Agee, M.D.  ASSISTANTStar Age, M.D.  ANESTHESIA:  General.  DESCRIPTION OF PROCEDURE:  The patient was taken to the operating room, placed in supine position and after adequate anesthesia was induced using the endotracheal tube, the abdomen was prepped and draped in the normal sterile fashion. The staples were removed and the obvious fascial dehiscence was observed. A number of previous sutures placed were removed and the abdomen was opened. There was a large amount of purulent material encountered. There was a number of interloop abscesses that were drained and interloop adhesions that were lysed. The majority of purulence was localized to the left side of the abdomen. After cleaning all of this out, I inspected the previous anastomosis meticulously. There appeared to be an obvious colonic leak at the anastomosis and therefore the anastomosis was mobilized using a GIA stapling device distal to the anastomosis, the colon was divided. It was divided proximally as well using a GIA stapling device. The resected portion was sent to pathology. The mesentery was mobilized. To perform an end colostomy, this still felt quite difficult to mobilize and therefore the splenic flexure was taken down. In doing so, the splenic capsule was torn and Surgicel was placed in the  left upper quadrant. Adequate hemostasis was ensured. The peritoneal reflection was taken down of the splenic flexure. This provided enough mobilization of the descending colon for end colostomy. The entire abdomen was copiously irrigated using 10 liters of saline solution. The entire small bowel was run and there was no evidence of perforation. The NG tube was verified to be in the stomach. At this point, I opted to perform an end colostomy. A circular incision was made in the left abdomen in the skin and a cruciate incision was made in the fascia. The descending colon was brought through the cruciate incision through the rectus muscle. The fascia was closed using running #1 Novofil and #1 Ethibond retention sutures. These were placed over bridges. The skin was packed open. The colostomy was matured in the standard fashion using 3-0 Vicryl sutures. Appliance was applied.  A left subclavian venipuncture was performed and using Seldinger technique, a double lumen CDC was placed without difficulty. Lumens were aspirated and flushed without difficulty. Chest x-ray is pending in the PACU. The patient tolerated the procedure well and went to PACU in critical condition. DD:  07/05/00 TD:  07/05/00 Job: [id number]

## 2011-02-07 NOTE — Op Note (Signed)
NAME:  [name], [name]                       ACCOUNT NO.:  [id number]   MEDICAL RECORD NO.:  [id number]                   PATIENT TYPE:  INP   LOCATION:  0002                                 FACILITY[facility]  [facility]   PHYSICIAN:  Marshall [place]. Jeffie Pollock, M.D.                 DATE OF BIRTH:  04/16/1941   DATE OF PROCEDURE:  08/22/2002  DATE OF DISCHARGE:                                 OPERATIVE REPORT   PROCEDURE:  1. Radical cystectomy with hysterectomy and bladder outlet obstruction.  2. Ileal conduit urinary diversion.   PREOPERATIVE DIAGNOSIS:  Muscle-invasive bladder cancer.   POSTOPERATIVE DIAGNOSIS:  Muscle-invasive bladder cancer.   SURGEON:  Marshall Cork. Jeffie Pollock, M.D.   ASSISTANT:  Thana Farr. Karsten Ro, M.D.   ANESTHESIA:  General.   SPECIMENS:  Bladder, uterus, and ovaries and bilateral distal ureteral  frozen sections which were negative for tumor.   DRAINS:  Bilateral single-J stents and Blake drain.   COMPLICATIONS:  None.   INDICATIONS:  [name] is a 70 year old white female, who was recently  referred for gross hematuria and was found to have extensive muscle-invasive  bladder tumor with obstruction of the right ureteral orifice.  She had a  history of multiple complicated bowel surgeries.  After discussing the  treatment options, she has elected radical cystectomy.  I elected to get  [name], M.D. involved to evaluate for a ventral hernia and  perform lysis of adhesions as needed to facilitate the radical cystectomy  and ileal conduit creation.   FINDINGS AT PROCEDURE:  The patient was given a complete formal mechanical  and antibiotic bowel prep  She was  taken to the holding area where she was  given 400 mg of Cipro IV.  She had thigh-high TED and PAS hose placed.  She  was taken to the operating room where a general anesthetic was induced.  A  central line was placed by anesthesia as well as an NG tube.  She was placed  in the low lithotomy position.  Her lower  abdomen was shaved.  Her abdomen  and genitalia was prepped with Betadine solution.  She was draped in the  usual sterile fashion.  A 20 French Foley catheter was inserted.  The  balloon was filled with 10 cc of sterile fluid, and the catheter was  plugged.  A lower abdominal incision was made through a prior midline wound  with the knife and Bovie.  The anterior rectus fascia was intact without  evidence of hernia.  The incision initially was from the pubis to the  umbilicus.  The space of Retzius was entered initially to get some  orientation before entering the abdominal cavity.  Using blunt dissection,  the right and left pelvic fossa were developed for the impending  lymphadenectomy and cystectomy.  We then were able to enter the peritoneal  cavity.  There were multiple adhesions of the bowel  to the anterior  abdominal wall.  I initially took down sufficient adhesions to free the  underside of the abdominal wall in the area of the lower abdominal incision  and then released adhesions to the pelvic floor.  Once this limited lysis of  adhesions had been performed, the bowel was packed cephalad and held in  place with a self-retaining retractor.  We then proceeded with the  cystectomy.  The peritoneum was incised down into the cul-de-sac.  The round  ligaments were identified and were divided using the LigaSure.  The right  ovary was dissected away from the pelvic sidewall.  The gonadal vessels were  identified and were controlled with the LigaSure.  The left ovary was then  identified, dissected away from the sidewall.  There was some increased  additional adhesions on this side, as it was the site of her original  sigmoid surgery but once the ovary was freed up, the gonadal vessel was  divided after sealing with the LigaSure.  At this point, the vesical and  uterine arteries were dissected bluntly and controlled with the LigaSure,  and the lateral attachments of the bladder were taken  down as low as the  endopelvic fascia on each side.  The pubovesical attachments were taken down  with the Bovie.  We then encountered some bleeding from the dorsal venous  complex that was controlled with packing for approximately five minutes.  Actual blood loss at this time was minimal due to immediate packing.  During  the dissection of the lateral pedicles to the bladder, the right ureter was  identified, divided between large clips, and the distal section was sent for  frozen section.  This returned negative for tumor.  This ureter was dilated  from obstruction by the invasive tumor.  The left ureter was identified in  an identical fashion and also divided between large clips with the distal  end being sent for frozen section.  At this point, a sponge-stick was placed  vaginally, and the vaginal wall was entered on the left adjacent to the  cervix using the Bovie.  Using the LigaSure and Metzenbaum scissors, the  vaginal wall was divided leaving a stricture anteriorly adjacent to the  bladder.  The LigaSure was then taken around posterior to the cervix to  divide the posterior vaginal wall.  Once both the posterior and lateral  aspects of the vaginal wall had been divided, the urethral meatus was  located, and the LigaSure was then used to come across the anterior vaginal  wall in front of the LigaSure.  At this point, the bladder, uterus, tubes,  and ovaries were removed as an en bloc specimen.  There was no evidence of  gross extension of tumor outside of this specimen.  At this point, the  anterior vaginal wall was closed using  running 0 Vicryl in order to  maintain vaginal diameter.  The distal section was closed in a Heineke-  Mikulicz fashion to preserve diameter, and then the more proximal vagina was  closed longitudinally in the midline with running locked 0 Vicryl which was  the same sort of stitch used in the Heineke-Mikulicz.  Inspection vaginally revealed a good anterior  vaginal repair.  At this point, the pelvis was  irrigated, and no active bleeding was identified.  A right iliac obturator  node dissection was then performed with the limits of dissection being the  circumflex iliac vein, the external iliac vein, the obturator nerve, and the  bifurcation of the hypogastric arteries.  There were several 1-1.5 cm nodes  that were firm and rubbery.  They appeared most consistent with hyperplastic  nodes, but node metastases could not be entirely excluded.  The node  dissection was then repeated on the left with similar limits of dissection.  No significant lymph nodes were identified in this specimen.  Most of these  were sent for permanent pathologic evaluation.  Once the node dissection had  been completed, a pack was placed in the pelvis.  [name], M.D.  was called in and performed an extensive lysis of adhesions of the small  bowel.  This will be dictated as a separate procedure.  But once the lysis  of adhesions had been completed and the ileocecal valve identified, an  appropriate section of ileum approximately 10-15 cm proximal to the  ileocecal valve was identified and chosen as the loop.  The distal end was  marked with a silk to ensure appropriate orientation and using hemostats and  the LigaSure, the distal mesenteric window was created measuring  approximately 6-8 cm, and the proximal mesenteric window was created once  again with the LigaSure and hemostats approximately 4 cm in length.  The  vascular arcade was transilluminated in this area and was felt to be very  generous.  At this point, the ileoileostomy was created using a stapled  anastomosis with the GIA and TA 50 staplers.  The initial fire of the TA 50  was unsuccessful.  This tissue was excised, and a second TA 50 was applied  and was successfully fired.  I am not sure what caused the malfunction in  the initial stapler, but after completion of the second staple line,   palpation of the bowel anastomosis revealed an adequate lumen and a good,  tight staple line.  The end of the staple line was oversewn with interrupted  3-0 silk pop-offs.  The mesenteric window was then closed with interrupted 3-  0 silk pop-offs with the newly tracked loop coming beneath the small bowel.  At this point, the left ureter which had been brought through a tunnel  beneath the sigmoid mesentery earlier in the procedure was brought up to the  butt of the loop.  The ends were trimmed back to well-vascularized, viable  ureter and spatulated for approximately 1-1.5 cm.  A small button of serosal  mucosa was removed from the anterior aspect of the butt of the loop, and the  ureter was then anastomosed to this bowel section using running 4-0 chromic  suture.  Prior to completing the anastomosis, a single-J stent was placed  with the aid of a Yankauer sucker barrel.  This passed easily to the kidney and once the wire was removed, it was secured to the ileal segment with a 4-  0 chromic stitch.  The right ureter was then anastomosed to the butt of the  loop in an identical fashion slightly lateral to the left ureteral orifice.  Once again, a single-J stent was placed without difficulty to the kidney and  secured with a 4-0 chromic stitch.  The left tube was left square; the right  was cut at an angle.  At this point, sponge counts were obtained and were  correct.  Inspection of the pelvis revealed no active bleeding.  The  Bookwalter retractor was removed, and the preoperatively marked stoma  lateral to the umbilicus on the right was created.  Initially a button of  skin with a  core of subcutaneous fat was removed with the Bovie.  The  anterior rectus fascia was identified and was incised in a cruciate fashion.  The muscle was then divided bluntly, and the fascial defect was expanded to  admit two fingers.  Using a Babcock clamp, the distal end of the intestinal  loop was then brought  through the stoma.  The position of the ureters and  the loop were checked to ensure minimal tension and good position.  Once all  was in appropriate place, the loop was secured to the fascia with  interrupted 2-0 Vicryl stitches.  The conduit was then matured using 3-0  chromic stitches in a Bricker fashion.  Once the stoma had been completed, a  #10 flat fully-fluted Blake drain was placed through a separate stab wound  on the left side of the abdominal wall lateral to the lower portion of the  abdominal incision.  This was draped through the pelvic floor and up to the  area of the ureteroileal anastomosis.  This was secured to the skin with a  nylon and placed to bulb suction.  The bowel was then repositioned within  the abdomen appropriately.  A sheath of anti-adhesion material was then  placed on the bowel beneath the abdominal incision.  The anterior rectus  fascia was then closed using running #1 PDS.  The skin was then closed with  clips.  The single-J stent were then trimmed to an appropriate length, and a  urostomy bag was applied after prepping the skin with tincture of Benzoin.  At this point, the patient was returned to the supine position.  Her  anesthetic was reversed.  She was moved to the recovery room in stable  condition.  There were no complications.                                               Marshall Cork. Jeffie Pollock, M.D.    JJW/MEDQ  D:  08/22/2002  T:  08/22/2002  Job:  [id number]   cc:   Doree Albee, M.D.  [place] [address], [address] [place] [address]  Fax: [phone]   [name], M.D.  [address] [place]  [place] [address]  Fax: [phone]

## 2011-02-07 NOTE — Consult Note (Signed)
NAME:  [name], [name]                       ACCOUNT NO.:  [id number]   MEDICAL RECORD NO.:  [id number]                   PATIENT TYPE:  INP   LOCATION:  [place]                                 FACILITY:  [facility]   PHYSICIAN:  Fenton Malling. Lucia Gaskins, M.D.               DATE OF BIRTH:  05-13-1941   DATE OF CONSULTATION:  01/09/2004  DATE OF DISCHARGE:                                   CONSULTATION   REASON FOR CONSULTATION:  Bowel obstruction.   HISTORY OF PRESENT ILLNESS:  This is a pleasant 70 year old white female who  presented acutely with epigastric abdominal pain. She had crampy abdominal  pain and was evaluated in the emergency room. Her initial KUB really was  fairly nonspecific but a CT scan that she had done suggested at least  partial bowel obstruction with a transition point in the right lower  quadrant. There was no evidence of recurrent carcinoma.   [name] has had multiple prior abdominal operations.  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  This was  done by Dr. Jackolyn Confer.  She had a fascial dehiscence with anastomotic  leak which required a colostomy. This was done by Dr. Truitt Leep in October  2001.  She then later had reversal of this colostomy but was found to have a  bladder cancer where she underwent a radical cystectomy with hysterectomy  and ileal conduit by Dr. Irine Seal in December of 2003.  At the same time  she was found to have extensive intraabdominal adhesions, Dr. Osborn Coho  scrubbed in and did lysis of those adhesions.   According to her over the years, she has had four periods when she has had  partial bowel obstructions and only one since her most recent ileal conduit.  These have all resolved with medical treatment i.e. NG tube, IV fluids.   Again this time, actually when I interviewed her tonight with her husband  there they were about to change her loop ileostomy. She is comfortable with  no  pain. she has an NG tube in place.   PAST MEDICAL HISTORY:  NEUROLOGIC:  No history of seizures or ____________.  PULMONARY:  No history of pneumonia or tuberculosis.  CARDIAC:  She has had  a history of hypertension, she is on Altace 5 mg daily and had no angina or  chest pain.  GASTROINTESTINAL:  See history of present illness.  I think she  also had a cholecystectomy on top of all her other surgery. UROLOGIC:  Again  she had the T3N1 bladder carcinoma which is treated with a radical  cystectomy and ileal conduit. She then subsequently had  chemotherapy__________ with cisplatin and Gemzar but has done well with that  again without evidence of recurrence at this time. MUSCULOSKELETAL:  She has  had back trouble.  Again she is accompanied by her husband.  MEDICATIONS:  Altace 5 mg daily.   ALLERGIES AND INTOLERANCES:  CEFOTETAN, MORPHINE, CODEINE.   PHYSICAL EXAMINATION:  VITAL SIGNS:  Temperature 98.0, pulse 71,  respirations 20, blood pressure 157/74.  LUNGS:  Clear to auscultation.  HEART:  Regular rate and rhythm without murmur or tub.  ABDOMEN:  Shows multiple abdominal scars, midline scar, left lower quadrant  scar. I guess with the colostomy her ileal conduit in the right lower  quadrant. She has no tenderness and no guarding.  On the CT scan, there is a  suggestion of a hernia probably off the right of midline.  I really could  not feel much in the way of mass.  She has no guarding, she does have some  bowel sounds which are present.   LABORATORY DATA:  Labs that I have show a white blood cell count of 10,600,  hemoglobin of 11.8, hematocrit 36.0.  Her sodium was 141, potassium 4.2,  chloride of 109, CO2 of 27, glucose of 157.  Her lipase was 27, her urine  was otherwise unremarkable.  I reviewed her CT scan with Dr. Hardin Negus. She  does have some dilated loops of small bowel. There does appear to be a  possible transition point similar to the right lower quadrant.  There is  no  evidence of recurrent tumor. She does appear to have maybe an abdominal  hernia sort of inferior and medial to her ileal conduit.   IMPRESSION:  1. Partial bowel obstruction and hopefully will resolve NG tube, IV fluids     and medical therapy.  Unfortunately her KUB really did not reflect the     status of her small bowel well.  2. 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  3. Hypertension which she is doing well with.  4. 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                                               Fenton Malling. Lucia Gaskins, M.D.    DHN/MEDQ  D:  01/09/2004  T:  01/09/2004  Job:  [id number]   cc:   Rudell Cobb. [name], M.D.  [address], [place] [address]  Fax: [place]. Jeffie Pollock, M.D.  [place]. [address], [place]  [place] [address]  Fax: [phone]   Haywood Lasso, M.D.  [address], [address] [place]  [place] [address]  Fax: [phone]

## 2011-02-07 NOTE — H&P (Signed)
NAME:  [name], HALPIN                       ACCOUNT NO.:  [id number]   MEDICAL RECORD NO.:  [id number]                   PATIENT TYPE:   LOCATION:                                       FACILITY:  [facility]   PHYSICIAN:  [place] C. Pearlie Oyster, M.D.               DATE OF BIRTH:  [birth year]   DATE OF ADMISSION:  DATE OF DISCHARGE:                                HISTORY & PHYSICAL   u   HISTORY OF PRESENT ILLNESS:  This is a 70 year old female status post  radical cystectomy with ileal conduit after an intermittent history of  hematuria.  Pathology report revealed a high grade papillary urothelial  carcinoma on July 21, 2002 ([id number]).  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  The margins were free of high grade tumor and  one of 16 lymph nodes was found to be positive.  The patient is currently  status post cycle number four of cisplatin and Gemzar with the last being  given on December 14, 2002.   PAST MEDICAL HISTORY:  1. Hypertension.  2. Cholecystectomy.  3. Osteoarthritis.  4. Colon resection secondary to hemangioma with dehiscence after surgery     with temporary colostomy until reversal in 2002.   GYNECOLOGICAL HISTORY:  G4, P4.  All vaginal deliveries and a tubal ligation  followed.   ALLERGIES:  CEFTIN, MORPHINE, and CODEINE cause hives.  PHENERGAN causes  hallucinations.   CURRENT MEDICATIONS:  1. Zofran 8 mg p.o. p.r.n. nausea.  2. Decadron 4 mg p.o. x3 days post chemotherapy.  3. Lexapro 10 mg p.o. daily.  4. Coumadin 1 mg p.o. daily.  5. Altace 5 mg p.o. daily.   SOCIAL HISTORY:  The patient lives with her husband just inside the  [facility]  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  They have four grown children  ranging in age from 62-43.  All are college educated and live in [place].  The patient  has a 50-pack-year smoking history having quit  approximately 10-1/2 years ago.   FAMILY HISTORY:  Her mother is alive at 43 having survived breast cancer.  She also has osteoarthritis.  The patient's father died of complications of  gallbladder surgery around the age of 2.  She has two sisters.  One of them  has had a heart valve replacement and one of them is alive and well.  She  has three brothers, one with gout, one having had a kidney removed.  She is  unsure why.  One is in generally poor health.   REVIEW OF SYSTEMS:  She has been doing quite well until approximately three  days ago when she noticed an odor in her urine.  Over the past two days she  has developed a  fever, chills.  She was given Levaquin yesterday.  She has  become increasingly short of breath with some dizziness and increasing foul  odor of the urine.  She came into the office today and was noted to be  extremely dyspneic and it was requested that she be seen.  She has had  increased fatigue and weakness over the last two days.  She has been more  cheerful.  She denies any chest pain or pleuritic pain.  She has had no  diarrhea or constipation.  She has had no swelling of extremities.   PHYSICAL EXAMINATION:  GENERAL:  This is an obese 70 year old female  currently extremely diaphoretic.  VITAL SIGNS:  Temperature 96, blood pressure 100/60, pulse 84, O2  saturations 100, respirations 24.  Unable to obtain blood pressure in  standing position but pulse did elevate to 104 beats per minute.  HEENT:  Sclerae are anicteric.  EOMs are intact.  PERRLA.  Mucous membranes  are moist without plaque or lesions.  LUNGS:  Breath sounds are clear to auscultation bilaterally.  CARDIOVASCULAR:  Regular rate and rhythm.  Tachycardic.  No murmur.  No  gallop.  CHEST:  Port-A-Cath site upper left chest without erythema or edema.  ABDOMEN:  Soft, nontender.  No masses.  No organomegaly.  GENITOURINARY:  There is a left upper  quadrant urostomy bag in place.  EXTREMITIES:  Without clubbing, cyanosis, edema.  NODES:  There were no cervical, supraclavicular, axillary, inguinal nodes  appreciated.  NEUROLOGIC:  Alert and oriented.  Cranial nerves II-XII grossly intact.  DTRs not assessed.   LABORATORIES:  Today in the office WBC was 4.07, neutrophils 3.42, RBCs  3.41, hemoglobin 8.73, hematocrit 26.5, MCV 77.8, MCH 25.6, platelets  225,000.   ASSESSMENT/PLAN:  1. 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  We will admit to the hospital for a work-up for urosepsis.     We will start intravenous fluids at 500 mL/hour for 1 L, then 200 mL/hour     and after 2 L we will go to 100 mL/hour.  We will start intravenous     antibiotics of Cipro 400 mg intravenous q.12h.  2. The patient has been nauseous.  We will start Reglan 10 mg intravenous     q.6h. as well as lorazepam 0.5-1 mg p.o. or intravenous for nausea.  We     will order urine and culture as well as blood cultures x2.  3. Currently, we are unable for the next problem lack of blood return from     Port-A-Cath.  No difficulty with infusion.  Will continue to use Port-A-     Cath without interruption for now.  4. Anemia.  Hemoglobin 8.73.  We will plan on typing and cross matching in     the a.m. for 2 units of packed red blood cells.  5. Depression.  We will continue her Lexapro 10 mg one p.o. daily.  6. Hypertension.  For now we will omit her Altace and resume this when     appropriate.  7. Pain.  She is experiencing no pain at the current time.  She also has     difficulty with allergies as far as morphine.  She has been able to take     hydrocodone in the past.  We will have this available if she needs it.  She was seen and examined by [facility] C. Pearlie Oyster, M.D. who reviewed all  admission orders.    Kandis Cocking, N.P.                       Solmon Ice. Pearlie Oyster, M.D.    Geralynn Rile  D:  12/20/2002  T:  12/20/2002  Job:  [id number]   cc:   Marshall Cork. Jeffie Pollock, M.D.  [place]. [address] [place]  [place] [address]  Fax: [phone]   Doree Albee, M.D.  [place] [address]  [place]  [place] [address]  Fax: [place]. [name], M.D.  [address]  [place], [place] [address]  Fax: [phone]

## 2011-03-19 ENCOUNTER — Other Ambulatory Visit: Payer: Self-pay | Admitting: Gastroenterology

## 2011-06-18 LAB — COMPREHENSIVE METABOLIC PANEL
ALT: 19
AST: 31
Alkaline Phosphatase: 94
CO2: 30
Chloride: 105
Creatinine, Ser: 0.71
GFR calc Af Amer: >60
GFR calc non Af Amer: >60
Potassium: 4.1
Total Bilirubin: 1.7 — ABNORMAL HIGH

## 2011-06-18 LAB — URINALYSIS, ROUTINE W REFLEX MICROSCOPIC
Bilirubin Urine: NEGATIVE
Ketones, ur: 15 — AB
Nitrite: POSITIVE — AB
Protein, ur: 30 — AB

## 2011-06-18 LAB — CBC
HCT: 31.5 — ABNORMAL LOW
HCT: 36
Hemoglobin: 10.4 — ABNORMAL LOW
MCV: 80.3
MCV: 81.6
Platelets: 79 — ABNORMAL LOW
RBC: 4.48
RDW: 17.4 — ABNORMAL HIGH
WBC: 4.5
WBC: 6

## 2011-06-18 LAB — DIFFERENTIAL
Basophils Absolute: 0
Basophils Relative: 0
Eosinophils Absolute: 0
Eosinophils Relative: 0

## 2011-06-18 LAB — BASIC METABOLIC PANEL
BUN: 12
Chloride: 107
Glucose, Bld: 134 — ABNORMAL HIGH
Potassium: 3.5
Sodium: 141

## 2011-06-18 LAB — URINE CULTURE: Colony Count: >=100000

## 2011-07-02 ENCOUNTER — Other Ambulatory Visit: Payer: Self-pay | Admitting: Hematology & Oncology

## 2011-07-02 ENCOUNTER — Encounter (HOSPITAL_BASED_OUTPATIENT_CLINIC_OR_DEPARTMENT_OTHER): Payer: Medicare Other | Admitting: Hematology & Oncology

## 2011-07-02 DIAGNOSIS — Z23 Encounter for immunization: Secondary | ICD-10-CM

## 2011-07-02 DIAGNOSIS — K7460 Unspecified cirrhosis of liver: Secondary | ICD-10-CM

## 2011-07-02 DIAGNOSIS — R413 Other amnesia: Secondary | ICD-10-CM

## 2011-07-02 DIAGNOSIS — Z8551 Personal history of malignant neoplasm of bladder: Secondary | ICD-10-CM

## 2011-07-02 DIAGNOSIS — E559 Vitamin D deficiency, unspecified: Secondary | ICD-10-CM

## 2011-07-02 DIAGNOSIS — C186 Malignant neoplasm of descending colon: Secondary | ICD-10-CM

## 2011-07-02 DIAGNOSIS — K766 Portal hypertension: Secondary | ICD-10-CM

## 2011-07-02 LAB — CBC WITH DIFFERENTIAL (CANCER CENTER ONLY)
BASO#: 0 10*3/uL (ref 0.0–0.2)
Eosinophils Absolute: 0.1 10*3/uL (ref 0.0–0.5)
HGB: 10.7 g/dL — ABNORMAL LOW (ref 11.6–15.9)
MCH: 26.4 pg (ref 26.0–34.0)
MONO#: 0.7 10*3/uL (ref 0.1–0.9)
MONO%: 15.9 % — ABNORMAL HIGH (ref 0.0–13.0)
NEUT#: 2.6 10*3/uL (ref 1.5–6.5)
Platelets: 79 10*3/uL — ABNORMAL LOW (ref 145–400)
RBC: 4.06 10*6/uL (ref 3.70–5.32)
WBC: 4.5 10*3/uL (ref 3.9–10.0)

## 2011-07-07 LAB — COMPREHENSIVE METABOLIC PANEL
Albumin: 3.8 g/dL (ref 3.5–5.2)
BUN: 13 mg/dL (ref 6–23)
CO2: 26 mEq/L (ref 19–32)
Calcium: 9.5 mg/dL (ref 8.4–10.5)
Chloride: 106 mEq/L (ref 96–112)
Glucose, Bld: 63 mg/dL — ABNORMAL LOW (ref 70–99)
Potassium: 4 mEq/L (ref 3.5–5.3)
Sodium: 142 mEq/L (ref 135–145)
Total Protein: 6.3 g/dL (ref 6.0–8.3)

## 2011-07-07 LAB — IRON AND TIBC: TIBC: 392 ug/dL (ref 250–470)

## 2011-07-07 LAB — LACTATE DEHYDROGENASE: LDH: 175 U/L (ref 94–250)

## 2011-07-07 LAB — FERRITIN: Ferritin: 9 ng/mL — ABNORMAL LOW (ref 10–291)

## 2011-07-07 LAB — VITAMIN D 25 HYDROXY (VIT D DEFICIENCY, FRACTURES): Vit D, 25-Hydroxy: 38 ng/mL (ref 30–89)

## 2011-08-11 ENCOUNTER — Telehealth: Payer: Self-pay | Admitting: *Deleted

## 2011-08-11 NOTE — Telephone Encounter (Signed)
Left pt mesage with 12-4 appointment. We got referral from Dr. Teofilo Pod office

## 2011-08-13 ENCOUNTER — Other Ambulatory Visit: Payer: Self-pay

## 2011-08-26 ENCOUNTER — Ambulatory Visit (HOSPITAL_BASED_OUTPATIENT_CLINIC_OR_DEPARTMENT_OTHER): Payer: Medicare Other | Admitting: Hematology & Oncology

## 2011-08-26 ENCOUNTER — Encounter: Payer: Self-pay | Admitting: Hematology & Oncology

## 2011-08-26 ENCOUNTER — Other Ambulatory Visit: Payer: Medicare Other | Admitting: Lab

## 2011-08-26 DIAGNOSIS — Z8551 Personal history of malignant neoplasm of bladder: Secondary | ICD-10-CM

## 2011-08-26 DIAGNOSIS — C679 Malignant neoplasm of bladder, unspecified: Secondary | ICD-10-CM

## 2011-08-26 DIAGNOSIS — C189 Malignant neoplasm of colon, unspecified: Secondary | ICD-10-CM

## 2011-08-26 DIAGNOSIS — K7460 Unspecified cirrhosis of liver: Secondary | ICD-10-CM

## 2011-08-26 DIAGNOSIS — R7309 Other abnormal glucose: Secondary | ICD-10-CM

## 2011-08-26 DIAGNOSIS — D509 Iron deficiency anemia, unspecified: Secondary | ICD-10-CM

## 2011-08-26 DIAGNOSIS — Z85038 Personal history of other malignant neoplasm of large intestine: Secondary | ICD-10-CM

## 2011-08-26 DIAGNOSIS — D696 Thrombocytopenia, unspecified: Secondary | ICD-10-CM

## 2011-08-26 HISTORY — DX: Unspecified cirrhosis of liver: K74.60

## 2011-08-26 LAB — CBC WITH DIFFERENTIAL (CANCER CENTER ONLY)
BASO#: 0 10*3/uL (ref 0.0–0.2)
EOS%: 2.1 % (ref 0.0–7.0)
Eosinophils Absolute: 0.1 10*3/uL (ref 0.0–0.5)
HCT: 33.7 % — ABNORMAL LOW (ref 34.8–46.6)
HGB: 10.8 g/dL — ABNORMAL LOW (ref 11.6–15.9)
LYMPH#: 1.2 10*3/uL (ref 0.9–3.3)
MCHC: 32 g/dL (ref 32.0–36.0)
NEUT%: 60.7 % (ref 39.6–80.0)
RBC: 4.2 10*6/uL (ref 3.70–5.32)

## 2011-08-26 LAB — IRON AND TIBC
%SAT: 20 % (ref 20–55)
Iron: 81 ug/dL (ref 42–145)
TIBC: 401 ug/dL (ref 250–470)
UIBC: 320 ug/dL (ref 125–400)

## 2011-08-26 LAB — TECHNOLOGIST REVIEW CHCC SATELLITE

## 2011-08-26 NOTE — Progress Notes (Signed)
This office note has been dictated.

## 2011-08-26 NOTE — Progress Notes (Signed)
CC:   Tammy R. Modena Morrow, M.D.  DIAGNOSES: 1. 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 2. 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 3. 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 4. Progressive dementia.  CURRENT THERAPY:  Observation.  INTERIM HISTORY:  Ms. Bautch comes in for an early visit.  We last saw her back in October.  At that point in time, her iron studies were down a little bit.  We are repeating these today.  Her main problem continues to be her memory.  This continues to worsen.  She has had no bleeding.  There is no abdominal pain.  There are no issues with her urostomy.  She had no cough.  There has been no rash. There has been no leg swelling.  Again, her main problem has been neurologic.  She does have some element of cirrhosis.  This has been progressive. This is nonalcoholic.  PHYSICAL EXAMINATION:  General:  This is a well-developed, well- nourished white female in no obvious distress.  Vital Signs: Temperature 97, pulse 65, respiratory rate 20, blood pressure 136/69, weight is 241.  Head and Neck Exam:  Shows a normocephalic, atraumatic skull.  There are no ocular or oral lesions.  There are no palpable cervical or supraclavicular lymph nodes.  Lungs:  Clear bilaterally. Cardiac Exam:  Regular rate and rhythm with a normal S1 and S2.  There are no murmurs, rubs, or bruits.  Abdominal Exam:  Soft with good bowel sounds.  There is no palpable abdominal mass.  There is no palpable fluid wave.  She has a well-healed laparotomy wound.  Her urostomy bag is intact.  Extremities:  Show no clubbing, cyanosis, or edema.  Skin Exam:  No rashes, ecchymoses, or petechiae.  LABORATORY STUDIES:  Laboratory studies show a white cell count of 5.2, hemoglobin 10.8, hematocrit 33.7, platelet count is 91,00.  MCV is 80.  IMPRESSIONS:  Ms. Nass is a 70 year old white female with a past history of both  colon and bladder cancer.  Her bladder cancer was 1st. She had this, I think back in 2004.  She was treated with a cystectomy, followed by adjuvant chemotherapy.  She then was found to have stage II colon cancer in November 2006.  She received 12 cycles of chemotherapy with FOLFOX.  I believe that she is cured of both.  She does have cirrhosis.  Her last CT scan done back in October 2011 showed changes consistent with cirrhosis and fatty infiltration.  She did have splenomegaly.  Apparently she is now dealing with borderline diabetes.  This certainly will worsen her fatty liver if her blood sugars become more of an issue.  We will see what her iron studies show.  We will plan to get her back for a regular appointment in February.    ______________________________ [name], M.D. PRE/MEDQ  D:  08/26/2011  T:  08/26/2011  Job:  [id number]  ADDENDUM:  Ferritin is 9.  %Sat is 20%.  Will give IV FeraHeme.

## 2011-09-01 ENCOUNTER — Other Ambulatory Visit: Payer: Self-pay | Admitting: Hematology & Oncology

## 2011-09-01 DIAGNOSIS — D509 Iron deficiency anemia, unspecified: Secondary | ICD-10-CM

## 2011-09-01 MED ORDER — FERUMOXYTOL INJECTION 510 MG/17 ML: 510.0000 mg | Freq: Once | INTRAVENOUS | Status: DC

## 2011-09-02 ENCOUNTER — Ambulatory Visit (HOSPITAL_BASED_OUTPATIENT_CLINIC_OR_DEPARTMENT_OTHER): Payer: Medicare Other

## 2011-09-02 VITALS — BP 118/70 | HR 59 | Temp 97.0°F | Ht 65.25 in | Wt 237.0 lb

## 2011-09-02 DIAGNOSIS — D509 Iron deficiency anemia, unspecified: Secondary | ICD-10-CM

## 2011-09-02 MED ORDER — FERUMOXYTOL INJECTION 510 MG/17 ML
510.0000 mg | Freq: Once | INTRAVENOUS | Status: AC
  Administered 2011-09-02: 510 mg via INTRAVENOUS
  Filled 2011-09-02: qty 17

## 2011-09-09 ENCOUNTER — Ambulatory Visit (HOSPITAL_BASED_OUTPATIENT_CLINIC_OR_DEPARTMENT_OTHER): Payer: Medicare Other

## 2011-09-09 VITALS — BP 122/61 | HR 63 | Temp 97.0°F

## 2011-09-09 DIAGNOSIS — D509 Iron deficiency anemia, unspecified: Secondary | ICD-10-CM

## 2011-09-09 MED ORDER — FERUMOXYTOL INJECTION 510 MG/17 ML
510.0000 mg | Freq: Once | INTRAVENOUS | Status: AC
  Administered 2011-09-09: 510 mg via INTRAVENOUS
  Filled 2011-09-09: qty 17

## 2011-09-09 NOTE — Patient Instructions (Signed)
Ferumoxytol injection What is this medicine? FERUMOXYTOL is an iron complex. Iron is used to make healthy red blood cells, which carry oxygen and nutrients throughout the body. This medicine is used to treat iron deficiency anemia in people with chronic kidney disease. This medicine may be used for other purposes; ask your health care provider or pharmacist if you have questions. What should I tell my health care provider before I take this medicine? They need to know if you have any of these conditions: -anemia not caused by low iron levels -high levels of iron in the blood -magnetic resonance imaging (MRI) test scheduled -an unusual or allergic reaction to iron, other medicines, foods, dyes, or preservatives -pregnant or trying to get pregnant -breast-feeding How should I use this medicine? This medicine is for infusion into a vein. It is given by a health care professional in a hospital or clinic setting.  What may interact with this medicine? This medicine may interact with the following medications: -other iron products This list may not describe all possible interactions. Give your health care provider a list of all the medicines, herbs, non-prescription drugs, or dietary supplements you use. Also tell them if you smoke, drink alcohol, or use illegal drugs. Some items may interact with your medicine. What should I watch for while using this medicine? Visit your doctor or healthcare professional regularly. Tell your doctor or healthcare professional if your symptoms do not start to get better or if they get worse. You may need blood work done while you are taking this medicine. You may need to follow a special diet. Talk to your doctor. Foods that contain iron include: whole grains/cereals, dried fruits, beans, or peas, leafy green vegetables, and organ meats (liver, kidney). What side effects may I notice from receiving this medicine? Side effects that you should report to your doctor or  health care professional as soon as possible: -allergic reactions like skin rash, itching or hives, swelling of the face, lips, or tongue -breathing problems -changes in blood pressure -feeling faint or lightheaded, falls -fever or chills -flushing, sweating, or hot feelings -swelling of the ankles or feet Side effects that usually do not require medical attention (Report these to your doctor or health care professional if they continue or are bothersome.): -diarrhea -headache -nausea, vomiting -stomach pain This list may not describe all possible side effects. Call your doctor for medical advice about side effects. You may report side effects to FDA at 1-800-FDA-1088. Where should I keep my medicine? This drug is given in a hospital or clinic and will not be stored at home. NOTE: This sheet is a summary. It may not cover all possible information. If you have questions about this medicine, talk to your doctor, pharmacist, or health care provider.  2012, Elsevier/Gold Standard. (05/31/2008 9:48:25 PM)

## 2011-09-09 NOTE — Progress Notes (Signed)
Error...wrong time entered

## 2011-09-22 ENCOUNTER — Encounter: Payer: Medicare Other | Attending: Family Medicine | Admitting: *Deleted

## 2011-09-22 ENCOUNTER — Encounter: Payer: Self-pay | Admitting: *Deleted

## 2011-09-22 DIAGNOSIS — Z713 Dietary counseling and surveillance: Secondary | ICD-10-CM | POA: Insufficient documentation

## 2011-09-22 DIAGNOSIS — R7309 Other abnormal glucose: Secondary | ICD-10-CM | POA: Insufficient documentation

## 2011-09-22 NOTE — Progress Notes (Signed)
Medical Nutrition Therapy:  Appt start time: 1000 end time:  1100.  Assessment:  Primary concerns today:  Hyperglycemia (790.29) Pt here with spouse for assessment of hyperglycemia.  A1c 5.7%, FBG 179 mg/dL (08/09/11).  Pt suffers memory loss d/t chemotherapy.  Spouse does cooking and shopping; reports pt sleeps late daily and usually misses breakfast.  Pt eats small breakfast of mainly CHO, then eats dinner at restaurant. Decreased calorie intake of primarily CHO noted. Spouse reports making a protein shake for pt with "some kind of protein powder" son gave them.  Pt does not exercise at this time.  Reports pain "all over", but mostly in shoulders and knee.     MEDICATIONS: See medication list; reconciled with spouse.   DIETARY INTAKE:  Usual eating pattern includes 2 meals and ? snacks per day.  24-hr recall:  B (9-12 AM): none (sleeps until 10-11 am) Snk ( AM): none  L ( PM): Cheerios w/ milk, coffee Snk ( PM): none D (6 PM): Out at [place] (fried) - 1-2 oz, baked sweet potato, popcorn shrimp (3-4 oz), slaw, 2 hush puppies; water Snk ( PM): Sometimes - Apple or yogurt  Usual physical activity: None - shoulder and leg pain  Estimated energy needs: 1300 calories 145 g carbohydrates 95 g protein 35 g fat  Progress Towards Goal(s):  In progress.   Nutritional Diagnosis:  [place]-2.2 Altered nutrition-related laboratory related to glucose metabolism as evidenced by recent A1c of 5.7% and FBG of 179 mg/dL.    Intervention/Goals:  Follow Pre-Diabetes Meal Plan as instructed (yellow card) - especially carbohydrate portions per meals/snacks.  Eat 3 meals and 2 snacks, every 3-5 hrs - AVOID meal skipping.  Add lean protein foods to all meals/snacks.  Aim for 15-20 min (or more) of physical activity daily.  Limit meals away from home; Use frozen vegetables or steamer bags.   Handouts given during visit include:  Living Well with Diabetes - [employer]  Snack list  30g and  45g CHO meals  Mr. Flonnie Overman Quick and Easy Diabetic Cooking  High Iron Foods list  Samples Dispensed:  Boost Calorie Smart:  2 ea (240 mL bottles) Lot #: [id number] Exp: 03/10/12  Monitoring/Evaluation:  Dietary intake, exercise, A1c (as available), and body weight in 6 week(s).

## 2011-09-22 NOTE — Patient Instructions (Signed)
Goals:  Follow Pre-Diabetes Meal Plan as instructed (yellow card) - especially carbohydrate portions per meals/snacks.  Eat 3 meals and 2 snacks, every 3-5 hrs - AVOID meal skipping.  Add lean protein foods to all meals/snacks.  Aim for 15-20 min (or more) of physical activity daily.  Limit meals away from home; Use frozen vegetables or steamer bags.

## 2011-09-25 ENCOUNTER — Encounter: Payer: Self-pay | Admitting: *Deleted

## 2011-09-26 ENCOUNTER — Encounter: Payer: Self-pay | Admitting: Family Medicine

## 2011-10-29 ENCOUNTER — Ambulatory Visit (HOSPITAL_BASED_OUTPATIENT_CLINIC_OR_DEPARTMENT_OTHER): Payer: Medicare Other | Admitting: Hematology & Oncology

## 2011-10-29 ENCOUNTER — Other Ambulatory Visit (HOSPITAL_BASED_OUTPATIENT_CLINIC_OR_DEPARTMENT_OTHER): Payer: Medicare Other | Admitting: Lab

## 2011-10-29 DIAGNOSIS — Z8551 Personal history of malignant neoplasm of bladder: Secondary | ICD-10-CM

## 2011-10-29 DIAGNOSIS — Z85038 Personal history of other malignant neoplasm of large intestine: Secondary | ICD-10-CM

## 2011-10-29 DIAGNOSIS — C679 Malignant neoplasm of bladder, unspecified: Secondary | ICD-10-CM

## 2011-10-29 DIAGNOSIS — D696 Thrombocytopenia, unspecified: Secondary | ICD-10-CM

## 2011-10-29 DIAGNOSIS — F0390 Unspecified dementia without behavioral disturbance: Secondary | ICD-10-CM

## 2011-10-29 DIAGNOSIS — C189 Malignant neoplasm of colon, unspecified: Secondary | ICD-10-CM

## 2011-10-29 DIAGNOSIS — D509 Iron deficiency anemia, unspecified: Secondary | ICD-10-CM

## 2011-10-29 LAB — CBC WITH DIFFERENTIAL (CANCER CENTER ONLY)
BASO%: 0.3 % (ref 0.0–2.0)
EOS%: 1.9 % (ref 0.0–7.0)
HGB: 12.9 g/dL (ref 11.6–15.9)
LYMPH#: 0.9 10*3/uL (ref 0.9–3.3)
MCH: 29.7 pg (ref 26.0–34.0)
MCHC: 34.6 g/dL (ref 32.0–36.0)
MONO%: 10.2 % (ref 0.0–13.0)
NEUT#: 2 10*3/uL (ref 1.5–6.5)
Platelets: 60 10*3/uL — ABNORMAL LOW (ref 145–400)
RDW: 18.2 % — ABNORMAL HIGH (ref 11.1–15.7)

## 2011-10-29 LAB — CHCC SATELLITE - SMEAR

## 2011-10-29 NOTE — Progress Notes (Signed)
This office note has been dictated.

## 2011-10-30 LAB — IRON AND TIBC
%SAT: 29 % (ref 20–55)
Iron: 81 ug/dL (ref 42–145)
UIBC: 196 ug/dL (ref 125–400)

## 2011-10-30 LAB — COMPREHENSIVE METABOLIC PANEL
CO2: 24 mEq/L (ref 19–32)
Creatinine, Ser: 0.68 mg/dL (ref 0.50–1.10)
Glucose, Bld: 177 mg/dL — ABNORMAL HIGH (ref 70–99)
Total Bilirubin: 1.3 mg/dL — ABNORMAL HIGH (ref 0.3–1.2)
Total Protein: 6 g/dL (ref 6.0–8.3)

## 2011-10-30 LAB — VITAMIN D 25 HYDROXY (VIT D DEFICIENCY, FRACTURES): Vit D, 25-Hydroxy: 50 ng/mL (ref 30–89)

## 2011-10-30 NOTE — Progress Notes (Signed)
CC:   Tammy R. Modena Morrow, M.D.  DIAGNOSES: 1. Iron deficiency anemia. 2. 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 3. History of stage II adenocarcinoma of the right colon. 4. 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 5. Progressive dementia.  CURRENT THERAPY:  Status post IV iron.  INTERIM HISTORY:  Ms. [name] comes in for follow-up.  She is doing okay.  We did go ahead and give her IV iron.  She tolerated this well. She got the IV iron back on 09/02/2011 and 09/09/2011.  At that point in time, her ferritin was only 9 with a saturation of 20%.  Her total iron was 81.  She tolerated the iron well.  Since then, she has had no issues.  She will be going on a cruise with her, I think, daughters in March.  This is a "girl vacation."  She has had no obvious bleeding.  She has no abdominal pain.  Her appetite is good.  Her dementia really is a big problem.  She really has very little memory at all.  PHYSICAL EXAMINATION:  General Appearance:  This is a well-developed, well-nourished white female in no obvious distress.  Vital Signs:  97.1, pulse 69, respiratory rate 20, blood pressure 113/66.  Weight is 227. Head and Neck Exam:  Shows a normocephalic, atraumatic skull.  There are no ocular or oral lesions.  There are no palpable cervical or supraclavicular lymph nodes.  Lungs:  Clear bilaterally.  Cardiac Exam: Regular rate and rhythm with a normal S1 and S2.  There are no murmurs, rubs or bruits.  Abdominal Exam:  Soft with good bowel sounds.  There is no palpable abdominal mass.  There is no palpable hepatosplenomegaly. She has well-healed laparotomy scars.  Her urostomy is in the right lower quadrant and well positioned.  Back Exam:  No tenderness over the spine, ribs or hips.  Extremities:  Show no clubbing, cyanosis or edema. Skin Exam:  No rashes, ecchymosis, or petechia.  LABORATORY STUDIES:  White cell count is 3.2,  hemoglobin 12.9, hematocrit 37.3, platelet count 60,000.  MCV is 86.  IMPRESSION:  Ms. Kithcart is a 71 year old white female.  Her problem now is the dementia.  She again has no short-term memory.  She did well with the iron.  She responded very nicely as expected.  As far as her history of malignancy is concerned, this is really not an issue.  She now is free from her bladder cancer by 9 years.  She is free from her colon cancer by 6 years.  I do not see a need for any kind scans right now.  I want to see her back in 3 months just to make sure that her anemia does not recur.    ______________________________ [name], M.D. PRE/MEDQ  D:  10/29/2011  T:  10/30/2011  Job:  [id number] ADDENDUM:  Ferritin is 39.  %Sat is 29.

## 2011-11-03 ENCOUNTER — Encounter: Payer: Medicare Other | Attending: Family Medicine | Admitting: *Deleted

## 2011-11-03 ENCOUNTER — Encounter: Payer: Self-pay | Admitting: *Deleted

## 2011-11-03 DIAGNOSIS — R7309 Other abnormal glucose: Secondary | ICD-10-CM | POA: Insufficient documentation

## 2011-11-03 DIAGNOSIS — Z713 Dietary counseling and surveillance: Secondary | ICD-10-CM | POA: Insufficient documentation

## 2011-11-03 NOTE — Patient Instructions (Signed)
Goals:  Continue to follow Pre-Diabetes Meal Plan as instructed (yellow card) - especially carbohydrate portions per meals/snacks.  AVOID meal skipping - add dinner meal and skip night snack.  Aim for 15-20 min (or more) of physical activity 3 days a week. Gradually increase as pain allows.

## 2011-11-03 NOTE — Progress Notes (Signed)
Medical Nutrition Therapy:  Appt start time: 1000 end time:  1100.   Primary concerns today:  Hyperglycemia (790.29); follow up. Pt here with spouse for follow up. Has lost ~10 lbs since last visit 09/22/11. Spouse reports pt no longer consumes sugar sweetened drinks or ice cream. Intake now includes more vegetables and wheat bread; continues protein drinks. No exercise noted at this time. Next A1c scheduled for 11/2011 with MD. Pt in a good mood and happy with wt loss.  MEDICATIONS: See medication list; reconciled with spouse.   DIETARY INTAKE:  Usual eating pattern includes 2 meals and 0-1 snacks per day.  Usual physical activity: None - shoulder and leg pain  Estimated energy needs: 1300 calories 145 g carbohydrates 95 g protein 35 g fat  Progress Towards Goal(s):  In progress.   Nutritional Diagnosis:  [place]-2.2 Altered nutrition-related laboratory related to glucose metabolism as evidenced by recent A1c of 5.7% and FBG of 179 mg/dL.    Intervention/Goals:  Continue to follow Pre-Diabetes Meal Plan as instructed (yellow card) - especially carbohydrate portions per meals/snacks.  AVOID meal skipping - add dinner meal and skip night snack.  Aim for 15-20 min (or more) of physical activity 3 days a week. Gradually increase as pain allows.  Monitoring/Evaluation:  Dietary intake, exercise, A1c (as available), and body weight in 3 months.

## 2011-11-05 ENCOUNTER — Encounter: Payer: Self-pay | Admitting: *Deleted

## 2011-11-25 ENCOUNTER — Telehealth: Payer: Self-pay | Admitting: Hematology & Oncology

## 2011-11-25 NOTE — Telephone Encounter (Signed)
I sch 12/23/11 apt.  i called and gave apt date/time to pt/  Pt is aware of apt

## 2011-12-23 ENCOUNTER — Other Ambulatory Visit (HOSPITAL_BASED_OUTPATIENT_CLINIC_OR_DEPARTMENT_OTHER): Payer: Medicare Other | Admitting: Lab

## 2011-12-23 ENCOUNTER — Ambulatory Visit (HOSPITAL_BASED_OUTPATIENT_CLINIC_OR_DEPARTMENT_OTHER): Payer: Medicare Other | Admitting: Hematology & Oncology

## 2011-12-23 VITALS — BP 127/66 | HR 60 | Temp 96.6°F | Ht 65.0 in | Wt 224.0 lb

## 2011-12-23 DIAGNOSIS — C189 Malignant neoplasm of colon, unspecified: Secondary | ICD-10-CM

## 2011-12-23 DIAGNOSIS — K7460 Unspecified cirrhosis of liver: Secondary | ICD-10-CM

## 2011-12-23 DIAGNOSIS — Z85038 Personal history of other malignant neoplasm of large intestine: Secondary | ICD-10-CM

## 2011-12-23 DIAGNOSIS — D509 Iron deficiency anemia, unspecified: Secondary | ICD-10-CM

## 2011-12-23 DIAGNOSIS — C679 Malignant neoplasm of bladder, unspecified: Secondary | ICD-10-CM

## 2011-12-23 DIAGNOSIS — D696 Thrombocytopenia, unspecified: Secondary | ICD-10-CM

## 2011-12-23 DIAGNOSIS — Z8551 Personal history of malignant neoplasm of bladder: Secondary | ICD-10-CM

## 2011-12-23 LAB — CBC WITH DIFFERENTIAL (CANCER CENTER ONLY)
BASO%: 0.8 % (ref 0.0–2.0)
EOS%: 2 % (ref 0.0–7.0)
HCT: 35.3 % (ref 34.8–46.6)
LYMPH#: 0.6 10*3/uL — ABNORMAL LOW (ref 0.9–3.3)
MCHC: 34 g/dL (ref 32.0–36.0)
MONO%: 9.6 % (ref 0.0–13.0)
NEUT%: 64.8 % (ref 39.6–80.0)
RDW: 16 % — ABNORMAL HIGH (ref 11.1–15.7)

## 2011-12-23 LAB — COMPREHENSIVE METABOLIC PANEL
AST: 27 U/L (ref 0–37)
Alkaline Phosphatase: 93 U/L (ref 39–117)
BUN: 12 mg/dL (ref 6–23)
Creatinine, Ser: 0.78 mg/dL (ref 0.50–1.10)
Potassium: 3.1 mEq/L — ABNORMAL LOW (ref 3.5–5.3)
Total Bilirubin: 1.6 mg/dL — ABNORMAL HIGH (ref 0.3–1.2)

## 2011-12-23 LAB — IRON AND TIBC
%SAT: 27 % (ref 20–55)
TIBC: 306 ug/dL (ref 250–470)

## 2011-12-23 LAB — CHCC SATELLITE - SMEAR

## 2011-12-23 NOTE — Progress Notes (Signed)
CC:   [name], M.D.  DIAGNOSES: 1. Iron deficiency anemia. 2. Leukopenia/thrombocytopenia secondary to nonalcoholic     steatohepatitis. 3. History of stage II adenocarcinoma of right colon. 4. 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 5. Progressive dementia.  CURRENT THERAPY:  The patient is receiving IV iron as needed.  INTERIM HISTORY:  [name] comes in for followup.  As always, she is very pleasant.  As always, her dementia continues to be a problem.  She got IV iron back in December.  She got 2 doses of Feraheme at 510 mg.  This did seem to help her quite a bit.  She has had no problems with bleeding.  She has had a good appetite. She has had no obvious problems with cough or shortness of breath.  When we last saw her in February, her ferritin was 39 with iron saturation of 29%.  PHYSICAL EXAMINATION:  General:  This is a well-developed, well- nourished white female in no obvious distress.  Vital signs:  96.6, pulse 60, respiratory rate 18, blood pressure 127/66.  Weight is 224. Head and neck:  Exam shows a normocephalic, atraumatic skull.  There are no ocular or oral lesions.  There are no palpable cervical or supraclavicular lymph nodes.  Lungs:  Are clear bilaterally.  Cardiac: Regular rate and rhythm with a normal S1 and S2.  There are no murmurs, rubs or bruits.  Abdomen:  Soft with good bowel sounds.  There is no palpable abdominal mass.  There is no palpable hepatosplenomegaly.  She does have laparotomy scars that are well-healed.  She has a urostomy site in the right lower quadrant.  Back:  No tenderness over the spine, ribs or hips.  Extremities:  Shows no clubbing, cyanosis or edema. Neurological:  Exam does show the dementia.  LABORATORY STUDIES:  Show white cell count 2.5, hemoglobin 12, hematocrit 35.3, platelet count of 60,000.  MCV is 90.  IMPRESSION:  [name] is a 71 year old white female with past  history of both bladder and colon cancer.  Again, I really believe she is cured of these.  She does have recurrent iron deficiency anemia.  It is hard to say where this might be coming from.  She responds well to iron when needed.  The leukopenia and thrombocytopenia are not an issue for her.  She does have cirrhosis and this will be noted with cirrhosis.  She has a functional hypersplenism.  I think we can probably get her back in another 3 months or so for followup.  I do not see a need for blood work in-between visits.    ______________________________ [name], M.D. PRE/MEDQ  D:  12/23/2011  T:  12/23/2011  Job:  [id number]

## 2011-12-29 ENCOUNTER — Telehealth: Payer: Self-pay | Admitting: *Deleted

## 2011-12-29 NOTE — Telephone Encounter (Addendum)
Message copied by Orlando Penner on Mon Dec 29, 2011  3:53 PM ------      Message from: Burney Gauze R      Created: Thu Dec 25, 2011 12:32 PM       Call her husband and tell him that the iron studies still looked okay. Thank you Pt called and given message.  Voiced understanding.

## 2012-01-13 ENCOUNTER — Encounter (INDEPENDENT_AMBULATORY_CARE_PROVIDER_SITE_OTHER): Payer: Self-pay | Admitting: Surgery

## 2012-01-13 ENCOUNTER — Ambulatory Visit (INDEPENDENT_AMBULATORY_CARE_PROVIDER_SITE_OTHER): Payer: Medicare Other | Admitting: Surgery

## 2012-01-13 VITALS — BP 136/84 | HR 60 | Temp 98.2°F | Resp 18 | Ht 65.5 in | Wt 224.0 lb

## 2012-01-13 DIAGNOSIS — R109 Unspecified abdominal pain: Secondary | ICD-10-CM

## 2012-01-13 DIAGNOSIS — K439 Ventral hernia without obstruction or gangrene: Secondary | ICD-10-CM | POA: Insufficient documentation

## 2012-01-13 NOTE — Progress Notes (Signed)
CC: Hernia HPI: This patient comes over for evaluation of a ventral incisional hernia. According to her husband has been present for several years but may be gradually enlarging. There is a little bit of discomfort from time to time. She's not had any signs of obstruction such as nausea vomiting problems with her bowel movements et Ronney Asters. She has had multiple abdominal procedures including a sigmoid resection followed by a wound dehiscence and, a bladder resection, and finally a right colectomy for cancer. At the latter procedure she had several small defects from her prior surgery repaired but no mesh was used because of contamination from the colectomy. She then followed for quite a while by their physicians I have not seen her for several years here.   ROS: The 12 point review of systems form is negative.  PMH:  Past Medical History  Diagnosis Date  . Anemia, iron deficiency 08/26/2011  . Cirrhosis 08/26/2011  . Colon cancer 2001  . Bladder cancer 2003  . Hypertension   . Memory loss     d/t chemotherapy   . Depression   . Thrombocytopenia   . Osteoporosis   . Blood transfusion   . Arthritis     PSH;\:  Past Surgical History  Procedure Date  . Colon surgery 2004  . Total knee arthroplasty 2010    Left knee  . Colon surgery 2001  . Colostomy   . Bladder surgery      MEDS: Current Outpatient Prescriptions  Medication Sig Dispense Refill  . Calcium Carbonate-Vitamin D (CALCIUM + D PO) Take by mouth daily.      . Cholecalciferol (VITAMIN D3) 1000 UNITS CAPS Take 1 capsule by mouth daily.        . Cinnamon 500 MG capsule Take 500 mg by mouth 2 (two) times daily.        . citalopram (CELEXA) 40 MG tablet Take 40 mg by mouth Daily.      . fish oil-omega-3 fatty acids 1000 MG capsule Take 2 g by mouth daily.        . ramipril (ALTACE) 5 MG capsule Take 5 mg by mouth Daily.          ALLERGIES:  Allergies  Allergen Reactions  . Bee Anaphylaxis  . Codeine Swelling and  Rash    Swelling in throat  . Morphine And Related Swelling and Rash  . Phenergan     Per MD chart - 08/08/11     PE GENERAL:  The patient is alert, oriented,pleasant but seems mildly confused and generally healthy-appearing, NAD. Mood and affect are normal.  HEENT:  The head is normocephalic, the eyes nonicteric, the pupils were round regular and equal. EOMs are normal. Pharynx normal. Dentition good.    LUNGS: Normal respirations and clear to auscultation.  HEART: Regular rhythm, with no murmurs rubs or gallops. Pulses are intact carotid dorsalis pedis and posterior tibial. No significant varicosities are noted.  ABDOMEN: Soft, flat, and nontender. No masses or organomegaly is noted.Ther is about a 6 cm defect to the left of the upper portion of the incision. IT protrudes when she stands and is flat and reduced when she is supine. It is not tender. There may be a second more inferior hernia, but this was not a definite finding on exam. She has an ileal loop in the RLQ EXTREMITIES:  Good range of motion, no edema.   Data Reviewed I have reviewed our old office notes as well as notes from the primary  care physician.  Assessment Ventral incisional hernia, no evidence of obstruction or incarceration. Minimally symptomatic.  Plan I think we need to get a CT scan to assess this better. I then she has significant risks for surgery with her development of cirrhosis, low white count and low platelet count. In addition, he'll be risky to put mesh in to repair the defect was the presence of ileostomy. Her last abdominal procedure was accompanied by a prolonged ileus. In addition her dementia will make a little bit more difficult for her to participate in her postoperative recovery. Unless there is some evidence that she may have impending obstruction or strangulation I think surgery probably should be deferred but will be able to better make this decision after the scan is done.

## 2012-01-13 NOTE — Patient Instructions (Signed)
We will arrange a CT of the abdomen to assess the size of the hernia and be sure there is only one

## 2012-01-16 ENCOUNTER — Ambulatory Visit
Admission: RE | Admit: 2012-01-16 | Discharge: 2012-01-16 | Disposition: A | Discharge status: Home / Self Care | Payer: Medicare Other | Source: Ambulatory Visit | Attending: Surgery | Admitting: Surgery

## 2012-01-16 MED ORDER — IOHEXOL 300 MG/ML  SOLN
125.0000 mL | Freq: Once | INTRAMUSCULAR | Status: AC | PRN
  Administered 2012-01-16: 125 mL via INTRAVENOUS

## 2012-01-20 ENCOUNTER — Telehealth (INDEPENDENT_AMBULATORY_CARE_PROVIDER_SITE_OTHER): Payer: Self-pay | Admitting: Surgery

## 2012-01-20 NOTE — Telephone Encounter (Signed)
The patient's CT scans shows that she has multiple ventral hernia defects most of which are broad-based. They don't seem to be particularly changed from a CT scan she had a few years ago. There is also a small peristomal hernia noted.  I also contacted her hematologist who recommended that we avoid surgery if possible given her overall multiple comorbidities. I spoke with her husband about all of this today and he is agreeable it we should not operate unless she becomes symptomatic. I told him I would see her again in about four months just to check everything and be sure no problems or developing. He is to bring her hand or take her to the emergency department if she has severe pain or any other problems that seem to be related to the hernia.

## 2012-01-28 ENCOUNTER — Telehealth: Payer: Self-pay | Admitting: Hematology & Oncology

## 2012-01-28 NOTE — Telephone Encounter (Signed)
Pt called said came in April didn't think she needed to come 5-9. Per MD note made appointment 7-8 patient aware

## 2012-01-29 ENCOUNTER — Ambulatory Visit: Payer: Medicare Other | Admitting: Hematology & Oncology

## 2012-01-29 ENCOUNTER — Other Ambulatory Visit: Payer: Medicare Other | Admitting: Lab

## 2012-02-26 ENCOUNTER — Inpatient Hospital Stay (HOSPITAL_COMMUNITY)
Admission: EM | Admit: 2012-02-26 | Discharge: 2012-02-29 | DRG: 378 | Disposition: A | Discharge status: Home / Self Care | Payer: Medicare Other | Attending: Internal Medicine | Admitting: Internal Medicine

## 2012-02-26 ENCOUNTER — Encounter (HOSPITAL_COMMUNITY): Payer: Self-pay | Admitting: Family Medicine

## 2012-02-26 DIAGNOSIS — D509 Iron deficiency anemia, unspecified: Secondary | ICD-10-CM | POA: Diagnosis present

## 2012-02-26 DIAGNOSIS — I8510 Secondary esophageal varices without bleeding: Secondary | ICD-10-CM | POA: Diagnosis present

## 2012-02-26 DIAGNOSIS — C189 Malignant neoplasm of colon, unspecified: Secondary | ICD-10-CM | POA: Diagnosis present

## 2012-02-26 DIAGNOSIS — I8500 Esophageal varices without bleeding: Secondary | ICD-10-CM | POA: Diagnosis present

## 2012-02-26 DIAGNOSIS — T3995XA Adverse effect of unspecified nonopioid analgesic, antipyretic and antirheumatic, initial encounter: Secondary | ICD-10-CM | POA: Diagnosis present

## 2012-02-26 DIAGNOSIS — K7460 Unspecified cirrhosis of liver: Secondary | ICD-10-CM

## 2012-02-26 DIAGNOSIS — K250 Acute gastric ulcer with hemorrhage: Secondary | ICD-10-CM | POA: Diagnosis present

## 2012-02-26 DIAGNOSIS — Z85038 Personal history of other malignant neoplasm of large intestine: Secondary | ICD-10-CM

## 2012-02-26 DIAGNOSIS — K922 Gastrointestinal hemorrhage, unspecified: Secondary | ICD-10-CM | POA: Diagnosis present

## 2012-02-26 DIAGNOSIS — Z938 Other artificial opening status: Secondary | ICD-10-CM

## 2012-02-26 DIAGNOSIS — F0391 Unspecified dementia with behavioral disturbance: Secondary | ICD-10-CM | POA: Diagnosis present

## 2012-02-26 DIAGNOSIS — K2901 Acute gastritis with bleeding: Secondary | ICD-10-CM

## 2012-02-26 DIAGNOSIS — D62 Acute posthemorrhagic anemia: Secondary | ICD-10-CM | POA: Diagnosis present

## 2012-02-26 DIAGNOSIS — S42309A Unspecified fracture of shaft of humerus, unspecified arm, initial encounter for closed fracture: Secondary | ICD-10-CM | POA: Diagnosis present

## 2012-02-26 DIAGNOSIS — S42209A Unspecified fracture of upper end of unspecified humerus, initial encounter for closed fracture: Secondary | ICD-10-CM

## 2012-02-26 DIAGNOSIS — C679 Malignant neoplasm of bladder, unspecified: Secondary | ICD-10-CM | POA: Diagnosis present

## 2012-02-26 DIAGNOSIS — K439 Ventral hernia without obstruction or gangrene: Secondary | ICD-10-CM | POA: Diagnosis present

## 2012-02-26 DIAGNOSIS — K2961 Other gastritis with bleeding: Secondary | ICD-10-CM | POA: Diagnosis present

## 2012-02-26 DIAGNOSIS — F0390 Unspecified dementia without behavioral disturbance: Secondary | ICD-10-CM | POA: Diagnosis present

## 2012-02-26 DIAGNOSIS — D649 Anemia, unspecified: Secondary | ICD-10-CM

## 2012-02-26 DIAGNOSIS — S42309D Unspecified fracture of shaft of humerus, unspecified arm, subsequent encounter for fracture with routine healing: Secondary | ICD-10-CM

## 2012-02-26 DIAGNOSIS — I10 Essential (primary) hypertension: Secondary | ICD-10-CM | POA: Diagnosis present

## 2012-02-26 DIAGNOSIS — R197 Diarrhea, unspecified: Secondary | ICD-10-CM | POA: Diagnosis present

## 2012-02-26 DIAGNOSIS — R112 Nausea with vomiting, unspecified: Secondary | ICD-10-CM

## 2012-02-26 DIAGNOSIS — D696 Thrombocytopenia, unspecified: Secondary | ICD-10-CM | POA: Diagnosis present

## 2012-02-26 DIAGNOSIS — Z8551 Personal history of malignant neoplasm of bladder: Secondary | ICD-10-CM

## 2012-02-26 DIAGNOSIS — K254 Chronic or unspecified gastric ulcer with hemorrhage: Principal | ICD-10-CM | POA: Diagnosis present

## 2012-02-26 DIAGNOSIS — K921 Melena: Secondary | ICD-10-CM | POA: Diagnosis present

## 2012-02-26 LAB — COMPREHENSIVE METABOLIC PANEL
ALT: 16 U/L (ref 0–35)
BUN: 40 mg/dL — ABNORMAL HIGH (ref 6–23)
CO2: 27 mEq/L (ref 19–32)
Calcium: 9.4 mg/dL (ref 8.4–10.5)
Creatinine, Ser: 0.64 mg/dL (ref 0.50–1.10)
GFR calc Af Amer: >90 mL/min (ref >90)
GFR calc non Af Amer: 87 mL/min — ABNORMAL LOW (ref >90)
Glucose, Bld: 129 mg/dL — ABNORMAL HIGH (ref 70–99)
Sodium: 139 mEq/L (ref 135–145)
Total Protein: 5.2 g/dL — ABNORMAL LOW (ref 6.0–8.3)

## 2012-02-26 LAB — DIFFERENTIAL
Basophils Absolute: 0.1 10*3/uL (ref 0.0–0.1)
Basophils Relative: 1 % (ref 0–1)
Lymphocytes Relative: 28 % (ref 12–46)
Monocytes Relative: 11 % (ref 3–12)
Neutro Abs: 6.6 10*3/uL (ref 1.7–7.7)
Neutrophils Relative %: 61 % (ref 43–77)

## 2012-02-26 LAB — CBC
Hemoglobin: 6.7 g/dL — CL (ref 12.0–15.0)
MCHC: 29.6 g/dL — ABNORMAL LOW (ref 30.0–36.0)
RDW: 17.1 % — ABNORMAL HIGH (ref 11.5–15.5)
WBC: 10.9 10*3/uL — ABNORMAL HIGH (ref 4.0–10.5)

## 2012-02-26 LAB — PROTIME-INR: INR: 1.64 — ABNORMAL HIGH (ref 0.00–1.49)

## 2012-02-26 MED ORDER — PANTOPRAZOLE SODIUM 40 MG IV SOLR
40.0000 mg | Freq: Once | INTRAVENOUS | Status: AC
  Administered 2012-02-26: 40 mg via INTRAVENOUS
  Filled 2012-02-26: qty 40

## 2012-02-26 MED ORDER — ACETAMINOPHEN 500 MG PO TABS
1000.0000 mg | ORAL_TABLET | Freq: Once | ORAL | Status: AC
  Administered 2012-02-26: 1000 mg via ORAL
  Filled 2012-02-26: qty 2

## 2012-02-26 MED ORDER — TRAMADOL HCL 50 MG PO TABS
50.0000 mg | ORAL_TABLET | Freq: Four times a day (QID) | ORAL | Status: DC | PRN
  Administered 2012-02-26 – 2012-02-27 (×2): 50 mg via ORAL
  Filled 2012-02-26 (×3): qty 1

## 2012-02-26 NOTE — Consult Note (Signed)
Subjective:   HPI  The patient is a 71 year old female who we are asked to see in regards to a four-day history of melena and anemia. About 10 days ago the patient was going to get on a cruise ship but fell and broke her arm. She was unable to go and taken to a physician and was put on ibuprofen which she has been taking. She started to experience melena 4 days ago. She denies hematemesis.  She has a past history of an EGD in 2006 which was negative.  She had a right hemicolectomy in the past for colon cancer. Her last colonoscopy was in June 2012.  Review of Systems Denies chest pain or shortness of breath  Past Medical History  Diagnosis Date  . Anemia, iron deficiency 08/26/2011  . Cirrhosis 08/26/2011  . Colon cancer 2001  . Bladder cancer 2003  . Hypertension   . Memory loss     d/t chemotherapy   . Depression   . Thrombocytopenia   . Osteoporosis   . Blood transfusion   . Arthritis   . Complication of anesthesia     confusion x 2 to 3 days   Past Surgical History  Procedure Date  . Colon surgery 2004  . Total knee arthroplasty 2010    Left knee  . Colon surgery 2001  . Colostomy   . Bladder surgery    History   Social History  . Marital Status: Married    Spouse Name: N/A    Number of Children: N/A  . Years of Education: N/A   Occupational History  . Not on file.   Social History Main Topics  . Smoking status: Former Smoker    Quit date: 09/22/1991  . Smokeless tobacco: Never Used  . Alcohol Use: No  . Drug Use: No  . Sexually Active:    Other Topics Concern  . Not on file   Social History Narrative  . No narrative on file   family history includes Cancer in her other; Diabetes in her brother; and Hypertension in her other. Current facility-administered medications:acetaminophen (TYLENOL) tablet 1,000 mg, 1,000 mg, Oral, Once, Nishant Dhungel, MD, 1,000 mg at 02/26/12 1803;  pantoprazole (PROTONIX) injection 40 mg, 40 mg, Intravenous, Once, Blanchie Dessert, MD, 40 mg at 02/26/12 1617 Current outpatient prescriptions:Calcium Carbonate-Vitamin D (CALCIUM + D PO), Take by mouth daily., Disp: , Rfl: ;  Cholecalciferol (VITAMIN D3) 1000 UNITS CAPS, Take 1 capsule by mouth daily.  , Disp: , Rfl: ;  Cinnamon 500 MG capsule, Take 500 mg by mouth 2 (two) times daily.  , Disp: , Rfl: ;  citalopram (CELEXA) 40 MG tablet, Take 40 mg by mouth Daily., Disp: , Rfl:  fish oil-omega-3 fatty acids 1000 MG capsule, Take 2 g by mouth daily.  , Disp: , Rfl: ;  ibuprofen (ADVIL,MOTRIN) 800 MG tablet, Take 800 mg by mouth every 8 (eight) hours as needed. For pain, Disp: , Rfl: ;  ramipril (ALTACE) 5 MG capsule, Take 5 mg by mouth Daily., Disp: , Rfl:  Allergies  Allergen Reactions  . Codeine Swelling and Rash    Swelling in throat  . Morphine And Related Swelling and Rash  . Nutritional Supplements Anaphylaxis  . Promethazine Hcl     Per MD chart - 08/08/11     Objective:     BP 134/39  Pulse 84  Temp(Src) 98.2 F (36.8 C) (Oral)  Resp 18  SpO2 100%  She is alert and in no  acute distress  Skin warm and dry  Heart regular rhythm no murmurs  Lungs clear  Abdomen soft nontender, she has a urostomy  Laboratory No components found with this basename: d1      Assessment:     Melena  Anemia  Prior recent CT scan raised questions of varices      Plan:     The patient is being admitted to the hospital and blood will be transfused. PPI therapy instituted. We will plan for EGD tomorrow.    Component Value Date/Time   WBC 10.9* 02/26/2012 1625   WBC 2.5* 12/23/2011 0943   WBC 3.5* 06/26/2008 1407   HGB 6.7* 02/26/2012 1625   HGB 12.0 12/23/2011 0943   HGB 11.0* 06/26/2008 1407   HCT 22.6* 02/26/2012 1625   HCT 35.3 12/23/2011 0943   HCT 32.8* 06/26/2008 1407   PLT 154 02/26/2012 1625   PLT 60* 12/23/2011 0943   PLT 93* 06/26/2008 1407   ALT 16 02/26/2012 1625   AST 26 02/26/2012 1625   AST 24 07/01/2010 0949   NA 139 02/26/2012 1625   NA 143 07/01/2010  0949   K 3.7 02/26/2012 1625   K 4.2 07/01/2010 0949   CL 103 02/26/2012 1625   CL 99 07/01/2010 0949   CREATININE 0.64 02/26/2012 1625   CREATININE 0.7 07/01/2010 0949   BUN 40* 02/26/2012 1625   BUN 13 07/01/2010 0949   CO2 27 02/26/2012 1625   CO2 33 07/01/2010 0949   CALCIUM 9.4 02/26/2012 1625   CALCIUM 9.4 07/01/2010 0949   ALKPHOS 78 02/26/2012 1625   ALKPHOS 87* 07/01/2010 0949

## 2012-02-26 NOTE — ED Notes (Signed)
Per paperwork pt has hgb of 6.7 that was drawn at pcp office today.

## 2012-02-26 NOTE — ED Provider Notes (Addendum)
History     CSN: [id number]  Arrival date & time 02/26/12  1435   First MD Initiated Contact with Patient 02/26/12 1510      Chief Complaint  Patient presents with  . Rectal Bleeding    (Consider location/radiation/quality/duration/timing/severity/associated sxs/prior treatment) Patient is a 71 y.o. female presenting with hematochezia. The history is provided by the patient.  Rectal Bleeding  The current episode started 2 days ago. The onset was sudden. The problem occurs continuously. The problem has been gradually worsening. The patient is experiencing no pain. The stool is described as liquid and mixed with blood. There was no prior successful therapy. There was no prior unsuccessful therapy. Associated symptoms include rectal pain. Pertinent negatives include no anorexia, no fever, no abdominal pain, no nausea, no chest pain, no coughing and no difficulty breathing. Associated symptoms comments: No chest pain . She has been eating and drinking normally. Urine output has been normal. Her past medical history is significant for abdominal surgery. Past medical history comments: Status post bladder cancer with urostomy. Status post colon cancer with a primary anastomosis. There were no sick contacts. Recently, medical care has been given at another facility and by the PCP.    Past Medical History  Diagnosis Date  . Anemia, iron deficiency 08/26/2011  . Cirrhosis 08/26/2011  . Colon cancer 2001  . Bladder cancer 2003  . Hypertension   . Memory loss     d/t chemotherapy   . Depression   . Thrombocytopenia   . Osteoporosis   . Blood transfusion   . Arthritis     Past Surgical History  Procedure Date  . Colon surgery 2004  . Total knee arthroplasty 2010    Left knee  . Colon surgery 2001  . Colostomy   . Bladder surgery     Family History  Problem Relation Age of Onset  . Diabetes Brother   . Cancer Other   . Hypertension Other     History  Substance Use Topics  .  Smoking status: Former Smoker    Quit date: 09/22/1991  . Smokeless tobacco: Not on file  . Alcohol Use: No    OB History    Grav Para Term Preterm Abortions TAB SAB Ect Mult Living                  Review of Systems  Constitutional: Negative for fever.  Respiratory: Negative for cough.   Cardiovascular: Negative for chest pain and leg swelling.  Gastrointestinal: Positive for hematochezia and rectal pain. Negative for nausea, abdominal pain and anorexia.  Neurological: Positive for weakness.  All other systems reviewed and are negative.    Allergies  Codeine; Morphine and related; Nutritional supplements; and Promethazine hcl  Home Medications   Current Outpatient Rx  Name Route Sig Dispense Refill  . CALCIUM + D PO Oral Take by mouth daily.    Marland Kitchen VITAMIN D3 1000 UNITS PO CAPS Oral Take 1 capsule by mouth daily.      Marland Kitchen CINNAMON 500 MG PO CAPS Oral Take 500 mg by mouth 2 (two) times daily.      Marland Kitchen CITALOPRAM HYDROBROMIDE 40 MG PO TABS Oral Take 40 mg by mouth Daily.    . OMEGA-3 FATTY ACIDS 1000 MG PO CAPS Oral Take 2 g by mouth daily.      . IBUPROFEN 800 MG PO TABS Oral Take 800 mg by mouth every 8 (eight) hours as needed. For pain    . RAMIPRIL 5  MG PO CAPS Oral Take 5 mg by mouth Daily.    . TRAMADOL HCL 50 MG PO TABS Oral Take 50 mg by mouth every 6 (six) hours as needed. For pain      BP 155/57  Pulse 94  Temp(Src) 98.9 F (37.2 C) (Oral)  Resp 22  SpO2 100%  Physical Exam  Nursing note and vitals reviewed. Constitutional: She is oriented to person, place, and time. She appears well-developed and well-nourished. No distress.  HENT:  Head: Normocephalic and atraumatic.  Mouth/Throat: Oropharynx is clear and moist.  Eyes: EOM are normal. Pupils are equal, round, and reactive to light.       Pale conjunctiva  Neck: Normal range of motion. Neck supple.  Cardiovascular: Normal rate, regular rhythm and intact distal pulses.   No murmur heard. Pulmonary/Chest:  Effort normal and breath sounds normal. No respiratory distress. She has no wheezes. She has no rales.  Abdominal: Soft. She exhibits no distension. There is no tenderness. There is no rebound and no guarding.  Genitourinary:       Black tarry stool  Musculoskeletal: Normal range of motion. She exhibits tenderness. She exhibits no edema.       Left arm is in a sling with diffuse healing ecchymosis over the left proximal humerus. Healing ecchymosis over bilateral knees  Neurological: She is alert and oriented to person, place, and time.  Skin: Skin is warm and dry. No rash noted. No erythema. There is pallor.  Psychiatric: She has a normal mood and affect. Her behavior is normal.    ED Course  Procedures (including critical care time)  Labs Reviewed  COMPREHENSIVE METABOLIC PANEL - Abnormal; Notable for the following:    Glucose, Bld 129 (*)    BUN 40 (*)    Total Protein 5.2 (*)    Albumin 2.6 (*)    GFR calc non Af Amer 87 (*)    All other components within normal limits  CBC - Abnormal; Notable for the following:    WBC 10.9 (*)    RBC 2.32 (*)    Hemoglobin 6.7 (*)    HCT 22.6 (*)    MCHC 29.6 (*)    RDW 17.1 (*)    All other components within normal limits  DIFFERENTIAL - Abnormal; Notable for the following:    Monocytes Absolute 1.2 (*)    All other components within normal limits  TYPE AND SCREEN  PREPARE RBC (CROSSMATCH)  ABO/RH   No results found.   Date: 02/26/2012  Rate: 91  Rhythm: normal sinus rhythm  QRS Axis: normal  Intervals: normal  ST/T Wave abnormalities: normal  Conduction Disutrbances: none  Narrative Interpretation: unremarkable   CRITICAL CARE Performed by: Blanchie Dessert   Total critical care time: 30  Critical care time was exclusive of separately billable procedures and treating other patients.  Critical care was necessary to treat or prevent imminent or life-threatening deterioration.  Critical care was time spent personally by  me on the following activities: development of treatment plan with patient and/or surrogate as well as nursing, discussions with consultants, evaluation of patient's response to treatment, examination of patient, obtaining history from patient or surrogate, ordering and performing treatments and interventions, ordering and review of laboratory studies, ordering and review of radiographic studies, pulse oximetry and re-evaluation of patient's condition.    1. GI bleeding   2. Anemia       MDM   Patient with black tarry stools for the last 2 days and  was seen at her PCP office and found to have positive Hemoccult and a hemoglobin of 6.7. Patient had been taking ibuprofen for 5 days due to her recent shoulder in humerus injury. She stopped the ibuprofen 3 days ago. She denies any localized abdominal pain and denies taking any anticoagulants. No chest pain but states diffuse weakness. CMP sent to patient placed on proton ex. We'll transfuse 2 units.        Blanchie Dessert, MD 02/26/12 1731  Blanchie Dessert, MD 02/26/12 1743

## 2012-02-26 NOTE — H&P (Addendum)
PCP:  Florina Ou, MD, MD   DOA:  02/26/2012  2:36 PM  Chief Complaint:  malenotic stools x 4 days  HPI: 71 y/o female with hx of HTN, adenocarcinoma of colon s/p end anastomosis, bladder ca s/p surgery with a urostomy, progressive dementia, iron deficiency anemia ( gets IV iron as outpatient and follows with Dr [name]), non alcoholic steatohepatitis with cirrhotic changes and esophageal and splenic varices seen on recent CT scan, thrombocytopenia and multiple ventral hernias was brought in by family for dark tarry stools for past 4 days.   patient was vacationing on n a cruise ship recently where she fell and broke her left humerus. she was taking ibuprofen 800 mg 3-4 times a day for 5 days and saw Dr Percell Miller last week who applied a sling and switched her pain meds  to tramadol.  For past 6-7 days she has been having watery diarrhea of several episodes without any abdominal pain and for past 4 days has been having dark tarry stools of several episodes.  She denies any nausea, vomiting or hematemesis. Denies any fever,  headache, dizziness, blurry visions but feels weak. Denies any abdominal pain, SOB , chest pain or palpitations.  In the ED patient was noted to have a HB OF 6.7. Her last hb 2 months back was 12. Patient planned to admit under hospitalist service for GI bleed.  On evaluating the patient she denies any symptoms besides pain in her fractured arm. She has hx of progressive dementia so history was provided mainly by her husband and her daughter.   Allergies: Allergies  Allergen Reactions  . Codeine Swelling and Rash    Swelling in throat  . Morphine And Related Swelling and Rash  . Nutritional Supplements Anaphylaxis  . Promethazine Hcl     Per MD chart - 08/08/11    Prior to Admission medications   Medication Sig Start Date End Date Taking? Authorizing Provider  Calcium Carbonate-Vitamin D (CALCIUM + D PO) Take by mouth daily.   Yes Historical Provider, MD  Cholecalciferol  (VITAMIN D3) 1000 UNITS CAPS Take 1 capsule by mouth daily.     Yes Historical Provider, MD  Cinnamon 500 MG capsule Take 500 mg by mouth 2 (two) times daily.     Yes Historical Provider, MD  citalopram (CELEXA) 40 MG tablet Take 40 mg by mouth Daily. 08/19/11  Yes Historical Provider, MD  fish oil-omega-3 fatty acids 1000 MG capsule Take 2 g by mouth daily.     Yes Historical Provider, MD  ibuprofen (ADVIL,MOTRIN) 800 MG tablet Take 800 mg by mouth every 8 (eight) hours as needed. For pain   Yes Historical Provider, MD  ramipril (ALTACE) 5 MG capsule Take 5 mg by mouth Daily. 08/19/11  Yes Historical Provider, MD    Past Medical History  Diagnosis Date  . Anemia, iron deficiency 08/26/2011  . Cirrhosis 08/26/2011  . Colon cancer 2001  . Bladder cancer 2003  . Hypertension   . Memory loss     d/t chemotherapy   . Depression   . Thrombocytopenia   . Osteoporosis   . Blood transfusion   . Arthritis   . Complication of anesthesia     confusion x 2 to 3 days    Past Surgical History  Procedure Date  . Colon surgery 2004  . Total knee arthroplasty 2010    Left knee  . Colon surgery 2001  . Colostomy   . Bladder surgery     Social  History:  reports that she quit smoking about 20 years ago. She has never used smokeless tobacco. She reports that she does not drink alcohol or use illicit drugs.  Family History  Problem Relation Age of Onset  . Diabetes Brother   . Cancer Other   . Hypertension Other     Review of Systems:  Constitutional: Denies fever, chills, diaphoresis, appetite change , positive for  fatigue.  HEENT: Denies photophobia, eye pain, redness, hearing loss, ear pain, congestion, sore throat, rhinorrhea, sneezing, mouth sores, trouble swallowing, neck pain, neck stiffness and tinnitus.   Respiratory: Denies SOB, DOE, cough, chest tightness,  and wheezing.   Cardiovascular: Denies chest pain, palpitations and leg swelling.  Gastrointestinal: dark tarry stool,  watery diarrhea, Denies nausea, vomiting, abdominal pain, diarrhea, constipation, and abdominal distention.  Genitourinary: Denies dysuria, urgency, frequency, hematuria, flank pain and difficulty urinating.  Musculoskeletal: Denies myalgias, back pain, joint swelling, arthralgias and gait problem. Pain over left arm due to fracture Skin: Denies pallor, rash and wound.  Neurological: Denies dizziness, seizures, syncope, weakness, light-headedness, numbness and headaches.  Hematological: Denies adenopathy. Easy bruising, personal or family bleeding history  Psychiatric/Behavioral: Denies suicidal ideation, mood changes, confusion, nervousness, sleep disturbance and agitation   Physical Exam:  Filed Vitals:   02/26/12 1443  BP: 155/57  Pulse: 94  Temp: 98.9 F (37.2 C)  TempSrc: Oral  Resp: 22  SpO2: 100%    Constitutional: Vital signs reviewed.  Patient is a well-developed and well-nourished in no acute distress and cooperative with exam. Alert and oriented x3.  Head: Normocephalic and atraumatic Ear: TM normal bilaterally Mouth: no erythema or exudates, MMM Eyes: PERRL, EOMI, conjunctivae normal, No scleral icterus.  Neck: Supple, Trachea midline normal ROM, No JVD, mass, thyromegaly, or carotid bruit present.  Cardiovascular: RRR, S1 normal, S2 normal, no MRG, pulses symmetric and intact bilaterally Pulmonary/Chest: CTAB, no wheezes, rales, or rhonchi Abdominal: Soft. Non-tender, non-distended, bowel sounds are normal, no masses, organomegaly, or guarding present.  GU: no CVA tenderness, urostomy bag in place.  Musculoskeletal: No joint deformities, erythema, or stiffness, ROM full and no nontender Ext: ecchymoses over left arm with sling, no edema and no cyanosis, pulses palpable bilaterally (DP and PT) Hematology: no cervical, inginal, or axillary adenopathy.  Neurological: A&O x3, ( is confused unable to provide a good history) Strenght is normal and symmetric bilaterally,  cranial nerve II-XII are grossly intact, no focal motor deficit, sensory intact to light touch bilaterally.  Skin: Warm, dry and intact. No rash, cyanosis, or clubbing.  Psychiatric: Normal mood and affect. speech and behavior is normal. Judgment and thought content normal. Cognition and memory are normal.   Labs on Admission:  Results for orders placed during the hospital encounter of 02/26/12 (from the past 48 hour(s))  COMPREHENSIVE METABOLIC PANEL     Status: Abnormal   Collection Time   02/26/12  4:25 PM      Component Value Range Comment   Sodium 139  135 - 145 (mEq/L)    Potassium 3.7  3.5 - 5.1 (mEq/L)    Chloride 103  96 - 112 (mEq/L)    CO2 27  19 - 32 (mEq/L)    Glucose, Bld 129 (*) 70 - 99 (mg/dL)    BUN 40 (*) 6 - 23 (mg/dL)    Creatinine, Ser 0.64  0.50 - 1.10 (mg/dL)    Calcium 9.4  8.4 - 10.5 (mg/dL)    Total Protein 5.2 (*) 6.0 - 8.3 (g/dL)  Albumin 2.6 (*) 3.5 - 5.2 (g/dL)    AST 26  0 - 37 (U/L)    ALT 16  0 - 35 (U/L)    Alkaline Phosphatase 78  39 - 117 (U/L)    Total Bilirubin 1.0  0.3 - 1.2 (mg/dL)    GFR calc non Af Amer 87 (*) >90 (mL/min)    GFR calc Af Amer >90  >90 (mL/min)   CBC     Status: Abnormal   Collection Time   02/26/12  4:25 PM      Component Value Range Comment   WBC 10.9 (*) 4.0 - 10.5 (K/uL)    RBC 2.32 (*) 3.87 - 5.11 (MIL/uL)    Hemoglobin 6.7 (*) 12.0 - 15.0 (g/dL)    HCT 22.6 (*) 36.0 - 46.0 (%)    MCV 97.4  78.0 - 100.0 (fL)    MCH 28.9  26.0 - 34.0 (pg)    MCHC 29.6 (*) 30.0 - 36.0 (g/dL)    RDW 17.1 (*) 11.5 - 15.5 (%)    Platelets 154  150 - 400 (K/uL)   DIFFERENTIAL     Status: Abnormal   Collection Time   02/26/12  4:25 PM      Component Value Range Comment   Neutrophils Relative 61  43 - 77 (%)    Neutro Abs 6.6  1.7 - 7.7 (K/uL)    Lymphocytes Relative 28  12 - 46 (%)    Lymphs Abs 3.0  0.7 - 4.0 (K/uL)    Monocytes Relative 11  3 - 12 (%)    Monocytes Absolute 1.2 (*) 0.1 - 1.0 (K/uL)    Eosinophils Relative 1  0 - 5  (%)    Eosinophils Absolute 0.1  0.0 - 0.7 (K/uL)    Basophils Relative 1  0 - 1 (%)    Basophils Absolute 0.1  0.0 - 0.1 (K/uL)    RBC Morphology POLYCHROMASIA PRESENT     TYPE AND SCREEN     Status: Normal (Preliminary result)   Collection Time   02/26/12  4:25 PM      Component Value Range Comment   ABO/RH(D) A POS      Antibody Screen NEG      Sample Expiration 02/29/2012      Unit Number [id number]      Blood Component Type RED CELLS,LR      Unit division 00      Status of Unit ALLOCATED      Transfusion Status OK TO TRANSFUSE      Crossmatch Result Compatible      Unit Number [id number]      Blood Component Type RED CELLS,LR      Unit division 00      Status of Unit ALLOCATED      Transfusion Status OK TO TRANSFUSE      Crossmatch Result Compatible     ABO/RH     Status: Normal   Collection Time   02/26/12  4:25 PM      Component Value Range Comment   ABO/RH(D) A POS     PREPARE RBC (CROSSMATCH)     Status: Normal   Collection Time   02/26/12  4:30 PM      Component Value Range Comment   Order Confirmation ORDER PROCESSED BY BLOOD BANK       Radiological Exams on Admission: No results found.  EKG: NSR , no ST-T changes, prolonged qTc OF  512  Assessment/Plan   *  Acute blood loss anemia  -secondary to melena most likely due to erosive gastritis with NSAIDs use -Admit to stepdown.  -Type and screen with 2 u PRBC transfusion - monitor H&H Q6-8  Hourly. Currently hemodynamically stable -Will order protonix drip Patient also has non alcoholic steatohepatitis with cirrhotic changes and Chronic splenomegaly, para  esophageal varices and perisplenic varices with left splenorenal  Shunt seen on recent CT abdomen.  -check PT/INR.  -i will also order octeotride drip overnight -Avoid NSAIDs -i have spoken with [facility] Dr Penelope Coop who agrees with the plan  recommends keeping her NPO overnight and will evaluaute in morning for  EGD   Cirrhosis Hx of non etoh steatosis with  cirrhotic chagnes on recent CT. lLFTs wnl. Check INR Rest of the plan as outlined above   Left humerus fracture  seen by Dr Percell Miller recently and arm sling applied   no NSAIDs. Allergie to morphine and codeine. continue prn tramadol   Diarrhea Has had watery diarrhea for almost a week after returning from cruise. Possibly viral gastroenteritis Will check for c diff    Colon cancer Currently stable and follows with Dr Maisie Fus as outpatient. Family informs her last colonoscopy was 6 months back   Anemia, iron deficiency Gets IV iron as outpatient with Dr [name]    Thrombocytopenia Currently stable. Monitor closely   Hx of bladder ca  currently stable and  has a urostomy bag    Ventral hernia without obstruction or gangrene Patient follows with her general surgeon as outpatient. No plans on surgery  Hypertension Hold BP meds  Dementia/ ? Depression  on celexa. Will hold given prolonged qtc   DVT prophylaxis: SCD boots  Diet: NPO  Full code   Plan discussed with family at bedside   Time Spent on Admission: 60 minutes  Criston Chancellor 02/26/2012, 6:37 PM

## 2012-02-26 NOTE — ED Notes (Signed)
Blood transfusion rate increased to 22m/hr.

## 2012-02-26 NOTE — ED Notes (Signed)
Per family pt has been having a lot of rectal bleeding starting 3 days ago. Pt is unable to state if she is having any pain besides left shoulder, from dislocation occuring approx 1 week ago from a fall, has already been treated for this. Reports diarrhea started Tuesday and stools have slowly turned black.

## 2012-02-26 NOTE — ED Notes (Signed)
Pt. With diarrhea and black stools on Wed. And today.  Pt. Taking Pepto-Bismol and Kaopectate.  Pt. Sent by her doctor for evaluation.

## 2012-02-26 NOTE — ED Notes (Signed)
Obtained EKG. Given to Dr. Maryan Rued

## 2012-02-26 NOTE — ED Notes (Signed)
Blood ready in blood bank.  Tech to pick up.

## 2012-02-27 ENCOUNTER — Encounter (HOSPITAL_COMMUNITY)
Admission: EM | Disposition: A | Discharge status: Home / Self Care | Payer: Self-pay | Source: Home / Self Care | Attending: Internal Medicine

## 2012-02-27 DIAGNOSIS — S42209A Unspecified fracture of upper end of unspecified humerus, initial encounter for closed fracture: Secondary | ICD-10-CM

## 2012-02-27 DIAGNOSIS — K7460 Unspecified cirrhosis of liver: Secondary | ICD-10-CM

## 2012-02-27 DIAGNOSIS — R112 Nausea with vomiting, unspecified: Secondary | ICD-10-CM

## 2012-02-27 DIAGNOSIS — K2901 Acute gastritis with bleeding: Secondary | ICD-10-CM

## 2012-02-27 HISTORY — PX: ESOPHAGOGASTRODUODENOSCOPY: SHX5428

## 2012-02-27 LAB — CBC
HCT: 21.5 % — ABNORMAL LOW (ref 36.0–46.0)
HCT: 23.9 % — ABNORMAL LOW (ref 36.0–46.0)
HCT: 25.6 % — ABNORMAL LOW (ref 36.0–46.0)
Hemoglobin: 7.1 g/dL — ABNORMAL LOW (ref 12.0–15.0)
Hemoglobin: 8 g/dL — ABNORMAL LOW (ref 12.0–15.0)
Hemoglobin: 8.2 g/dL — ABNORMAL LOW (ref 12.0–15.0)
MCH: 29.3 pg (ref 26.0–34.0)
MCH: 29.5 pg (ref 26.0–34.0)
MCH: 28.8 pg (ref 26.0–34.0)
MCHC: 33 g/dL (ref 30.0–36.0)
MCHC: 33.5 g/dL (ref 30.0–36.0)
MCHC: 32 g/dL (ref 30.0–36.0)
MCV: 88.8 fL (ref 78.0–100.0)
MCV: 88.2 fL (ref 78.0–100.0)
MCV: 89.8 fL (ref 78.0–100.0)
Platelets: 100 10*3/uL — ABNORMAL LOW (ref 150–400)
Platelets: 81 10*3/uL — ABNORMAL LOW (ref 150–400)
RBC: 2.42 MIL/uL — ABNORMAL LOW (ref 3.87–5.11)
RBC: 2.71 MIL/uL — ABNORMAL LOW (ref 3.87–5.11)
RDW: 17 % — ABNORMAL HIGH (ref 11.5–15.5)
RDW: 17 % — ABNORMAL HIGH (ref 11.5–15.5)
RDW: 17.4 % — ABNORMAL HIGH (ref 11.5–15.5)
WBC: 8.7 10*3/uL (ref 4.0–10.5)
WBC: 6 10*3/uL (ref 4.0–10.5)

## 2012-02-27 LAB — PREPARE RBC (CROSSMATCH)

## 2012-02-27 LAB — COMPREHENSIVE METABOLIC PANEL
ALT: 15 U/L (ref 0–35)
AST: 28 U/L (ref 0–37)
CO2: 26 mEq/L (ref 19–32)
Calcium: 8.8 mg/dL (ref 8.4–10.5)
Chloride: 105 mEq/L (ref 96–112)
Creatinine, Ser: 0.63 mg/dL (ref 0.50–1.10)
GFR calc Af Amer: >90 mL/min (ref >90)
GFR calc non Af Amer: 87 mL/min — ABNORMAL LOW (ref >90)
Glucose, Bld: 97 mg/dL (ref 70–99)
Sodium: 140 mEq/L (ref 135–145)
Total Bilirubin: 2.4 mg/dL — ABNORMAL HIGH (ref 0.3–1.2)

## 2012-02-27 SURGERY — EGD (ESOPHAGOGASTRODUODENOSCOPY)
Anesthesia: Moderate Sedation

## 2012-02-27 MED ORDER — TRAMADOL HCL 50 MG PO TABS
50.0000 mg | ORAL_TABLET | Freq: Once | ORAL | Status: AC
  Administered 2012-02-27: 50 mg via ORAL

## 2012-02-27 MED ORDER — OCTREOTIDE LOAD VIA INFUSION
25.0000 ug | Freq: Once | INTRAVENOUS | Status: DC
  Administered 2012-02-27: 25 ug via INTRAVENOUS
  Filled 2012-02-27: qty 13

## 2012-02-27 MED ORDER — FENTANYL CITRATE 0.05 MG/ML IJ SOLN
INTRAMUSCULAR | Status: DC | PRN
  Administered 2012-02-27: 25 ug via INTRAVENOUS

## 2012-02-27 MED ORDER — DIPHENHYDRAMINE HCL 50 MG/ML IJ SOLN
12.5000 mg | Freq: Four times a day (QID) | INTRAMUSCULAR | Status: DC | PRN
  Administered 2012-02-28 – 2012-02-29 (×2): 12.5 mg via INTRAVENOUS
  Filled 2012-02-27 (×2): qty 1

## 2012-02-27 MED ORDER — SODIUM CHLORIDE 0.9 % IV SOLN: INTRAVENOUS | Status: DC

## 2012-02-27 MED ORDER — OMEGA-3-ACID ETHYL ESTERS 1 G PO CAPS
1.0000 g | ORAL_CAPSULE | Freq: Every day | ORAL | Status: DC
  Administered 2012-02-28 – 2012-02-29 (×2): 1 g via ORAL
  Filled 2012-02-27 (×3): qty 1

## 2012-02-27 MED ORDER — OMEGA-3 FATTY ACIDS 1000 MG PO CAPS: 2.0000 g | ORAL_CAPSULE | Freq: Every day | ORAL | Status: DC

## 2012-02-27 MED ORDER — MIDAZOLAM HCL 10 MG/2ML IJ SOLN
INTRAMUSCULAR | Status: AC
  Filled 2012-02-27: qty 2

## 2012-02-27 MED ORDER — TRAMADOL HCL 50 MG PO TABS
100.0000 mg | ORAL_TABLET | Freq: Four times a day (QID) | ORAL | Status: DC | PRN
  Filled 2012-02-27: qty 2

## 2012-02-27 MED ORDER — SODIUM CHLORIDE 0.9 % IJ SOLN
3.0000 mL | Freq: Two times a day (BID) | INTRAMUSCULAR | Status: DC
  Administered 2012-02-27 (×3): 3 mL via INTRAVENOUS

## 2012-02-27 MED ORDER — SODIUM CHLORIDE 0.9 % IV SOLN
8.0000 mg/h | INTRAVENOUS | Status: DC
  Administered 2012-02-27 (×2): 8 mg/h via INTRAVENOUS
  Filled 2012-02-27 (×4): qty 80

## 2012-02-27 MED ORDER — ONDANSETRON HCL 4 MG/2ML IJ SOLN: 4.0000 mg | Freq: Three times a day (TID) | INTRAMUSCULAR | Status: AC | PRN

## 2012-02-27 MED ORDER — PANTOPRAZOLE SODIUM 40 MG IV SOLR
40.0000 mg | Freq: Two times a day (BID) | INTRAVENOUS | Status: DC
  Administered 2012-02-27 – 2012-02-29 (×5): 40 mg via INTRAVENOUS
  Filled 2012-02-27 (×5): qty 40

## 2012-02-27 MED ORDER — NADOLOL 40 MG PO TABS
40.0000 mg | ORAL_TABLET | Freq: Every day | ORAL | Status: DC
  Administered 2012-02-27: 40 mg via ORAL
  Filled 2012-02-27 (×2): qty 1

## 2012-02-27 MED ORDER — MIDAZOLAM HCL 10 MG/2ML IJ SOLN
INTRAMUSCULAR | Status: DC | PRN
  Administered 2012-02-27: 2 mg via INTRAVENOUS

## 2012-02-27 MED ORDER — HYDROMORPHONE HCL PF 1 MG/ML IJ SOLN
0.5000 mg | Freq: Four times a day (QID) | INTRAMUSCULAR | Status: DC | PRN
  Administered 2012-02-27 – 2012-02-28 (×4): 0.5 mg via INTRAVENOUS
  Filled 2012-02-27 (×4): qty 1

## 2012-02-27 MED ORDER — SODIUM CHLORIDE 0.9 % IV SOLN
25.0000 ug/h | INTRAVENOUS | Status: DC
  Administered 2012-02-27: 25 ug/h via INTRAVENOUS
  Filled 2012-02-27 (×2): qty 1

## 2012-02-27 MED ORDER — VITAMIN D3 25 MCG (1000 UT) PO CAPS: 1.0000 | ORAL_CAPSULE | Freq: Every day | ORAL | Status: DC

## 2012-02-27 MED ORDER — BUTAMBEN-TETRACAINE-BENZOCAINE 2-2-14 % EX AERO
INHALATION_SPRAY | CUTANEOUS | Status: DC | PRN
  Administered 2012-02-27: 2 via TOPICAL

## 2012-02-27 MED ORDER — VITAMIN D3 25 MCG (1000 UNIT) PO TABS
1000.0000 [IU] | ORAL_TABLET | Freq: Every day | ORAL | Status: DC
  Administered 2012-02-28 – 2012-02-29 (×2): 1000 [IU] via ORAL
  Filled 2012-02-27 (×3): qty 1

## 2012-02-27 MED ORDER — FENTANYL CITRATE 0.05 MG/ML IJ SOLN
INTRAMUSCULAR | Status: AC
  Filled 2012-02-27: qty 2

## 2012-02-27 MED ORDER — TRAMADOL HCL 50 MG PO TABS
100.0000 mg | ORAL_TABLET | ORAL | Status: DC | PRN
  Administered 2012-02-27 – 2012-02-29 (×8): 100 mg via ORAL
  Filled 2012-02-27: qty 1
  Filled 2012-02-27 (×4): qty 2
  Filled 2012-02-27: qty 1
  Filled 2012-02-27 (×3): qty 2

## 2012-02-27 MED ORDER — SODIUM CHLORIDE 0.9 % IV SOLN
INTRAVENOUS | Status: AC
  Administered 2012-02-27 (×2): 10 mL/h via INTRAVENOUS

## 2012-02-27 NOTE — Plan of Care (Signed)
Problem: Phase I Progression Outcomes Goal: Pain controlled with appropriate interventions Outcome: Not Progressing Pain in left/ broken arm is uncontrolled. No abdominal pain.  Goal: Voiding-avoid urinary catheter unless indicated Outcome: Not Applicable Date Met:  19/59/74 Pt has urostomy Goal: Hemodynamically stable Outcome: Progressing Receiving 2/2 units now. Last hgb 7.1 prior to second transfusion. VSS stable.

## 2012-02-27 NOTE — Progress Notes (Signed)
Pt C/O ? chestpain or epigastric pain with unclear description of this pain. EKG per protocol done and Dr. Clementeen Graham informed. [name]

## 2012-02-27 NOTE — Progress Notes (Signed)
[facility] Progress Note  Subjective: Patient is experiencing no further melena her main complaint being left arm pain. She is pleasant but confused which is her baseline.  Objective: Vital signs in last 24 hours: Temp:  [97.3 F (36.3 C)-98.9 F (37.2 C)] 97.3 F (36.3 C) (06/07 0800) Pulse Rate:  [75-94] 84  (06/07 1145) Resp:  [11-27] 14  (06/07 1145) BP: (95-159)/(39-104) 159/60 mmHg (06/07 1145) SpO2:  [95 %-100 %] 100 % (06/07 1145) Weight:  [101.7 kg (224 lb 3.3 oz)] 101.7 kg (224 lb 3.3 oz) (06/07 0005) Weight change:    PE: Abdomen soft  Lab Results: Results for orders placed during the hospital encounter of 02/26/12 (from the past 24 hour(s))  COMPREHENSIVE METABOLIC PANEL     Status: Abnormal   Collection Time   02/26/12  4:25 PM      Component Value Range   Sodium 139  135 - 145 (mEq/L)   Potassium 3.7  3.5 - 5.1 (mEq/L)   Chloride 103  96 - 112 (mEq/L)   CO2 27  19 - 32 (mEq/L)   Glucose, Bld 129 (*) 70 - 99 (mg/dL)   BUN 40 (*) 6 - 23 (mg/dL)   Creatinine, Ser 0.64  0.50 - 1.10 (mg/dL)   Calcium 9.4  8.4 - 10.5 (mg/dL)   Total Protein 5.2 (*) 6.0 - 8.3 (g/dL)   Albumin 2.6 (*) 3.5 - 5.2 (g/dL)   AST 26  0 - 37 (U/L)   ALT 16  0 - 35 (U/L)   Alkaline Phosphatase 78  39 - 117 (U/L)   Total Bilirubin 1.0  0.3 - 1.2 (mg/dL)   GFR calc non Af Amer 87 (*) >90 (mL/min)   GFR calc Af Amer >90  >90 (mL/min)  CBC     Status: Abnormal   Collection Time   02/26/12  4:25 PM      Component Value Range   WBC 10.9 (*) 4.0 - 10.5 (K/uL)   RBC 2.32 (*) 3.87 - 5.11 (MIL/uL)   Hemoglobin 6.7 (*) 12.0 - 15.0 (g/dL)   HCT 22.6 (*) 36.0 - 46.0 (%)   MCV 97.4  78.0 - 100.0 (fL)   MCH 28.9  26.0 - 34.0 (pg)   MCHC 29.6 (*) 30.0 - 36.0 (g/dL)   RDW 17.1 (*) 11.5 - 15.5 (%)   Platelets 154  150 - 400 (K/uL)  DIFFERENTIAL     Status: Abnormal   Collection Time   02/26/12  4:25 PM      Component Value Range   Neutrophils Relative 61  43 - 77 (%)   Neutro Abs 6.6   1.7 - 7.7 (K/uL)   Lymphocytes Relative 28  12 - 46 (%)   Lymphs Abs 3.0  0.7 - 4.0 (K/uL)   Monocytes Relative 11  3 - 12 (%)   Monocytes Absolute 1.2 (*) 0.1 - 1.0 (K/uL)   Eosinophils Relative 1  0 - 5 (%)   Eosinophils Absolute 0.1  0.0 - 0.7 (K/uL)   Basophils Relative 1  0 - 1 (%)   Basophils Absolute 0.1  0.0 - 0.1 (K/uL)   RBC Morphology POLYCHROMASIA PRESENT    TYPE AND SCREEN     Status: Normal (Preliminary result)   Collection Time   02/26/12  4:25 PM      Component Value Range   ABO/RH(D) A POS     Antibody Screen NEG     Sample Expiration 02/29/2012     Unit Number  [id number]     Blood Component Type RED CELLS,LR     Unit division 00     Status of Unit ISSUED     Transfusion Status OK TO TRANSFUSE     Crossmatch Result Compatible     Unit Number [id number]     Blood Component Type RED CELLS,LR     Unit division 00     Status of Unit ISSUED,FINAL     Transfusion Status OK TO TRANSFUSE     Crossmatch Result Compatible     Unit Number [id number]     Blood Component Type RED CELLS,LR     Unit division 00     Status of Unit ALLOCATED     Transfusion Status OK TO TRANSFUSE     Crossmatch Result Compatible     Unit Number [id number]     Blood Component Type RED CELLS,LR     Unit division 00     Status of Unit ALLOCATED     Transfusion Status OK TO TRANSFUSE     Crossmatch Result Compatible    ABO/RH     Status: Normal   Collection Time   02/26/12  4:25 PM      Component Value Range   ABO/RH(D) A POS    PREPARE RBC (CROSSMATCH)     Status: Normal   Collection Time   02/26/12  4:30 PM      Component Value Range   Order Confirmation ORDER PROCESSED BY BLOOD BANK    PROTIME-INR     Status: Abnormal   Collection Time   02/26/12  7:10 PM      Component Value Range   Prothrombin Time 19.7 (*) 11.6 - 15.2 (seconds)   INR 1.64 (*) 0.00 - 1.49   CBC     Status: Abnormal   Collection Time   02/27/12 12:40 AM      Component Value Range   WBC 8.7  4.0 - 10.5 (K/uL)   RBC 2.42  (*) 3.87 - 5.11 (MIL/uL)   Hemoglobin 7.1 (*) 12.0 - 15.0 (g/dL)   HCT 21.5 (*) 36.0 - 46.0 (%)   MCV 88.8  78.0 - 100.0 (fL)   MCH 29.3  26.0 - 34.0 (pg)   MCHC 33.0  30.0 - 36.0 (g/dL)   RDW 17.0 (*) 11.5 - 15.5 (%)   Platelets 100 (*) 150 - 400 (K/uL)  MRSA PCR SCREENING     Status: Normal   Collection Time   02/27/12  1:05 AM      Component Value Range   MRSA by PCR NEGATIVE  NEGATIVE   PREPARE RBC (CROSSMATCH)     Status: Normal   Collection Time   02/27/12  2:30 AM      Component Value Range   Order Confirmation ORDER PROCESSED BY BLOOD BANK    COMPREHENSIVE METABOLIC PANEL     Status: Abnormal   Collection Time   02/27/12  7:51 AM      Component Value Range   Sodium 140  135 - 145 (mEq/L)   Potassium 3.4 (*) 3.5 - 5.1 (mEq/L)   Chloride 105  96 - 112 (mEq/L)   CO2 26  19 - 32 (mEq/L)   Glucose, Bld 97  70 - 99 (mg/dL)   BUN 32 (*) 6 - 23 (mg/dL)   Creatinine, Ser 0.63  0.50 - 1.10 (mg/dL)   Calcium 8.8  8.4 - 10.5 (mg/dL)   Total Protein 5.2 (*) 6.0 - 8.3 (g/dL)   Albumin 2.7 (*)  3.5 - 5.2 (g/dL)   AST 28  0 - 37 (U/L)   ALT 15  0 - 35 (U/L)   Alkaline Phosphatase 77  39 - 117 (U/L)   Total Bilirubin 2.4 (*) 0.3 - 1.2 (mg/dL)   GFR calc non Af Amer 87 (*) >90 (mL/min)   GFR calc Af Amer >90  >90 (mL/min)  CBC     Status: Abnormal   Collection Time   02/27/12  7:51 AM      Component Value Range   WBC 6.0  4.0 - 10.5 (K/uL)   RBC 2.71 (*) 3.87 - 5.11 (MIL/uL)   Hemoglobin 8.0 (*) 12.0 - 15.0 (g/dL)   HCT 23.9 (*) 36.0 - 46.0 (%)   MCV 88.2  78.0 - 100.0 (fL)   MCH 29.5  26.0 - 34.0 (pg)   MCHC 33.5  30.0 - 36.0 (g/dL)   RDW 17.0 (*) 11.5 - 15.5 (%)   Platelets 81 (*) 150 - 400 (K/uL)    Studies/Results: EGD revealed moderate esophageal varices with careful inspection revealing no stigmata of hemorrhage. She had 2 proximal adjacent gastric ulcers approximately 4 and 3 mm with one having a small flat visible vessel that was nonbleeding. There was no blood or coffee  grounds in the stomach or duodenum. There was mild portal gastropathy appearance in the proximal stomach and mild bulbar duodenitis Assessment: GI bleeding suspect several days ago from proximal gastric ulcers, cannot absolutely rule out esophageal variceal bleed but no evidence of active or recent bleeding  Plan: Continue proton pump inhibitor. We'll check H. pylori status and treat for eradication of Helicobacter if positive. Consider beta blocker for prophylaxis of bleeding varices but would probably hold off if she has any relative contraindications.    QJFHL,KTGY C 02/27/2012, 11:51 AM

## 2012-02-27 NOTE — OR Nursing (Signed)
Bedside case, recovered in ICU.

## 2012-02-27 NOTE — Progress Notes (Signed)
Subjective: Patient seen and examined this morning. No further melena. Has been complaining of pain in her fractured left arm. She was also complaining of epigastric pain. EKG was done which was normal.  Objective:  Vital signs in last 24 hours:  Filed Vitals:   02/27/12 1135 02/27/12 1140 02/27/12 1145 02/27/12 1200  BP:  117/57 159/60 143/48  Pulse: 78 80 84 86  Temp:      TempSrc:      Resp: 13 19 14 18   Height:      Weight:      SpO2: 100% 97% 100% 100%    Intake/Output from previous day:   Intake/Output Summary (Last 24 hours) at 02/27/12 1230 Last data filed at 02/27/12 0700  Gross per 24 hour  Intake 1048.5 ml  Output    400 ml  Net  648.5 ml    Physical Exam: General: Elderly female lying in bed in no acute distress Head eye ENT: No pallor no icterus moist oral mucosa Cardiovascular: RRR, S1 normal, S2 normal, no MRG, pulses symmetric and intact bilaterally  Pulmonary/Chest: CTAB, no wheezes, rales, or rhonchi  Abdominal: Soft. Non-tender, non-distended, bowel sounds are normal, no masses, organomegaly, or guarding present.  GU: no CVA tenderness, urostomy bag in place.  Musculoskeletal: No joint deformities, erythema, or stiffness, ROM full and no nontender Ext: ecchymoses over left arm with sling, no edema and no cyanosis, pulses palpable bilaterally (DP and PT)  Hematology: no cervical, inginal, or axillary adenopathy.  Neurological: A&O x3, ( is confused unable to provide a good history) Strenght is normal and symmetric bilaterally, cranial nerve II-XII are grossly intact, no focal motor deficit, sensory intact to light touch bilaterally.    Lab Results:  Basic Metabolic Panel:    Component Value Date/Time   NA 140 02/27/2012 0751   NA 143 07/01/2010 0949   K 3.4* 02/27/2012 0751   K 4.2 07/01/2010 0949   CL 105 02/27/2012 0751   CL 99 07/01/2010 0949   CO2 26 02/27/2012 0751   CO2 33 07/01/2010 0949   BUN 32* 02/27/2012 0751   BUN 13 07/01/2010 0949   CREATININE 0.63 02/27/2012 0751   CREATININE 0.7 07/01/2010 0949   GLUCOSE 97 02/27/2012 0751   GLUCOSE 105 07/01/2010 0949   CALCIUM 8.8 02/27/2012 0751   CALCIUM 9.4 07/01/2010 0949   CBC:    Component Value Date/Time   WBC 6.0 02/27/2012 0751   WBC 2.5* 12/23/2011 0943   WBC 3.5* 06/26/2008 1407   HGB 8.0* 02/27/2012 0751   HGB 12.0 12/23/2011 0943   HGB 11.0* 06/26/2008 1407   HCT 23.9* 02/27/2012 0751   HCT 35.3 12/23/2011 0943   HCT 32.8* 06/26/2008 1407   PLT 81* 02/27/2012 0751   PLT 60* 12/23/2011 0943   PLT 93* 06/26/2008 1407   MCV 88.2 02/27/2012 0751   MCV 90 12/23/2011 0943   MCV 80.3* 06/26/2008 1407   NEUTROABS 6.6 02/26/2012 1625   NEUTROABS 1.6 12/23/2011 0943   NEUTROABS 2.2 06/26/2008 1407   LYMPHSABS 3.0 02/26/2012 1625   LYMPHSABS 0.6* 12/23/2011 0943   LYMPHSABS 0.9 06/26/2008 1407   MONOABS 1.2* 02/26/2012 1625   MONOABS 0.4 06/26/2008 1407   EOSABS 0.1 02/26/2012 1625   EOSABS 0.1 12/23/2011 0943   EOSABS 0.1 06/26/2008 1407   BASOSABS 0.1 02/26/2012 1625   BASOSABS 0.0 12/23/2011 0943   BASOSABS 0.0 06/26/2008 1407    Recent Results (from the past 240 hour(s))  MRSA PCR SCREENING  Status: Normal   Collection Time   02/27/12  1:05 AM      Component Value Range Status Comment   MRSA by PCR NEGATIVE  NEGATIVE  Final     Studies/Results: No results found.  Medications: Scheduled Meds:   . sodium chloride   Intravenous STAT  . acetaminophen  1,000 mg Oral Once  . cholecalciferol  1,000 Units Oral Daily  . octreotide  25 mcg Intravenous Once  . omega-3 acid ethyl esters  1 g Oral Daily  . pantoprazole (PROTONIX) IV  40 mg Intravenous Once  . sodium chloride  3 mL Intravenous Q12H  . traMADol  50 mg Oral Once  . traMADol  50 mg Oral Once  . DISCONTD: fish oil-omega-3 fatty acids  2 g Oral Daily  . DISCONTD: Vitamin D3  1 capsule Oral Daily   Continuous Infusions:   . octreotide (SANDOSTATIN) infusion 25 mcg/hr (02/27/12 0700)  . pantoprozole (PROTONIX) infusion 8 mg/hr (02/27/12  1021)   PRN Meds:.diphenhydrAMINE, HYDROmorphone (DILAUDID) injection, ondansetron (ZOFRAN) IV, traMADol, DISCONTD: butamben-tetracaine-benzocaine, DISCONTD: fentaNYL, DISCONTD: midazolam, DISCONTD: traMADol, DISCONTD: traMADol   Assessment/Plan   *Acute blood loss anemia  -secondary to melena most likely due to erosive gastritis with NSAIDs use  -Continue monitoring in stepdown.  -Hemoglobin improved to 8.1 with 2 u PRBC transfusion (presented with a hemoglobin of 6.7, last hemoglobin 2 months back off 12] -[facility] and had EGD done this morning which showed to proximal gastric ulcers which were nonbleeding, with mild portal gastropathy in the proximal most, and mild bulbar duodenitis. Moderate dysphasia that he says were seen with no stigmata of hemorrhage. GI recommends that the bleeding is possibly due to recent bleeding proximal gastric ulcer cannot rule out esophageal variceal bleed however there is no active bleeding. - monitor H&H Q 12 Hourly. Currently hemodynamically stable  -recommend  to continue PPI and a beta blocker prophylaxis of bleeding varices. Will add nadolol. Gastric bx for H pylori sent. Patient also has non alcoholic steatohepatitis with cirrhotic changes and varices on recent CT. -Avoid NSAIDs  -d/c octerotride drip and switch protonix drip to IV  bid.  Cirrhosis  Hx of non etoh steatosis with cirrhotic chagnes on recent CT.  LFTs wnl. Mildly elevate INR. Esophageal varices noted , GI recommend BB for prophylaxis. Will add nadolol Rest of the plan as outlined above    Left humerus fracture  seen by Dr Percell Miller recently and arm sling applied . Has pain and discomfort . I have paged Dr Curt Bears who's covering today to evaluate her fracture and adjust the sling. She had x-ray done at Dr. Debroah Loop office he no NSAIDs. Allergie to morphine and codeine. continue prn tramadol   Diarrhea  Has had watery diarrhea for almost a week after returning from cruise.  Possibly viral gastroenteritis  Is improved today  Colon cancer  Currently stable and follows with Dr Maisie Fus as outpatient. Family informs her last colonoscopy was 6 months back   Anemia, iron deficiency  Gets IV iron as outpatient with Dr [name]   Thrombocytopenia  Noted for drop in platelets in a.m. Labs.  monitor closely  Hx of bladder ca  currently stable and has a urostomy bag   Ventral hernia without obstruction or gangrene  Patient follows with her general surgeon as outpatient. No plans on surgery   Hypertension  Hold BP meds   Dementia/ ? Depression  on celexa. Will hold given prolonged qtc   DVT prophylaxis:  SCD boots   Diet: NPO   Full code  Plan discussed with family at bedside   LOS: 1 day   Hesham Womac 02/27/2012, 12:31 PM

## 2012-02-27 NOTE — Progress Notes (Signed)
This RN spoke with Donnal Debar, NP prior to hanging blood. She is aware that patient does not have enough IV sites to run protonix, sandostatin, and blood at once. Sandostatin will be started after blood infuses.

## 2012-02-27 NOTE — Progress Notes (Signed)
Patient admitted for GI bleed post fall several days ago. On assessment, patient had extensive bruising on left arm, especially the upper arm. Left arm is fractured and in a sling. Significant bruising on right shin from knee to ankle. Small area of bruising on right breast.

## 2012-02-27 NOTE — Progress Notes (Signed)
Pt sleeping prior to assessment. She did wake up and state that her arm was still aching but fell asleep while this RN was looking up her PRN pain medications on the computer. This RN decided to hold pain medication due to patient remaining drowsy after EGD procedure/ sedation earlier today. Husband at bedside and stated that she tends to remain drowsy for 3 days after sedation.

## 2012-02-27 NOTE — Op Note (Signed)
[facility] [place],   [id number]  ENDOSCOPY PROCEDURE REPORT  PATIENT:  Erica, Robertson  MR#:  #[id number] BIRTHDATE:  1940/11/23, 70 yrs. old  GENDER:  female  ENDOSCOPIST:  Teena Irani Referred by:  PROCEDURE DATE:  02/27/2012 PROCEDURE: ASA CLASS: INDICATIONS:  MEDICATIONS:  Fentanyl 25 mcg, Versed 2 mg. TOPICAL ANESTHETIC:  DESCRIPTION OF PROCEDURE:   After the risks and benefits of the procedure were explained, informed consent was obtained.  The EG-[id number] ([id number]) endoscope was introduced through the mouth and advanced to the .  The instrument was slowly withdrawn as the mucosa was fully examined. <<PROCEDUREIMAGES>>  Esophagus showed moderate esophageal varices up to 30 cm. Careful inspection revealed no stigmata of hemorrhage and no old or  fresh blood. Within the stomach there were 2 small fairly clean-based ulcers with a tiny dark spot and one of near the cardia with no active bleeding. There was a moderate portal gastropathy appearance in the proximal stomach. The distal stomach appeared normal. Both the bulb and second portion of the duodenum were well inspected and appeared normal. COMPLICATIONS:  None  ENDOSCOPIC IMPRESSION: Moderate sized esophageal varices with no stigmata of hemorrhage. 2 small proximal gastric ulcers, suspect possible early or bleeding site.  RECOMMENDATIONS: Monitor stools and hemoglobin, continue PPI. Consider beta blocker but would hold off if significant relative contraindications given her age and dementia. We'll also check H. pylori status and treat for reticulocyte addition of Helicobacter if positive.  ______________________________ Teena Irani  CC:  n. eSIGNED:   Teena Irani at 02/27/2012 01:12 PM  Mellody Life, #[phone]

## 2012-02-27 NOTE — Care Management Note (Unsigned)
    Page 1 of 1   02/27/2012     2:32:49 PM   CARE MANAGEMENT NOTE 02/27/2012  Patient:  Erica, Robertson   Account Number:  [id number]  Date Initiated:  02/27/2012  Documentation initiated by:  Dessa Phi  Subjective/Objective Assessment:   ADMITTED W/MELENA STOOLS.VF:MBBUY CA,BLADDER CA,CIRRHOSIS,L ARM FX,DEMENTIA     Action/Plan:   FROM HOME W/SPOUSE   Anticipated DC Date:  03/04/2012   Anticipated DC Plan:  [facility]  CM consult      Choice offered to / List presented to:             Status of service:  In process, will continue to follow Medicare Important Message given?   (If response is "NO", the following Medicare IM given date fields will be blank) Date Medicare IM given:   Date Additional Medicare IM given:    Discharge Disposition:    Per UR Regulation:  Reviewed for med. necessity/level of care/duration of stay  If discussed at [place] of Stay Meetings, dates discussed:    Comments:  02/27/12 [facility] RN,BSN NCM [phone]

## 2012-02-27 NOTE — Progress Notes (Signed)
Sandostatin not started yet due to lack of IV sites. This medication is not compatible with protonix and pt only has 1 IV site. IV team is currently in the room attempting to find more IV access. Pt will also need 1 unit of blood tonight per Donnal Debar, NP.

## 2012-02-27 NOTE — Plan of Care (Signed)
Problem: Phase I Progression Outcomes Goal: Pain controlled with appropriate interventions Outcome: Progressing Pain medications changed to improve pain scores

## 2012-02-27 NOTE — Plan of Care (Signed)
Problem: Phase I Progression Outcomes Goal: Hemodynamically stable Outcome: Progressing Continue to monitor CBC Q12 but Hgb improving, VSS

## 2012-02-28 LAB — BASIC METABOLIC PANEL
BUN: 23 mg/dL (ref 6–23)
CO2: 29 mEq/L (ref 19–32)
Calcium: 8.6 mg/dL (ref 8.4–10.5)
Creatinine, Ser: 0.71 mg/dL (ref 0.50–1.10)
GFR calc non Af Amer: 84 mL/min — ABNORMAL LOW (ref >90)
Glucose, Bld: 99 mg/dL (ref 70–99)

## 2012-02-28 LAB — CBC
HCT: 25.4 % — ABNORMAL LOW (ref 36.0–46.0)
Hemoglobin: 8.2 g/dL — ABNORMAL LOW (ref 12.0–15.0)
MCH: 28.9 pg (ref 26.0–34.0)
Platelets: 121 10*3/uL — ABNORMAL LOW (ref 150–400)
RDW: 17.5 % — ABNORMAL HIGH (ref 11.5–15.5)
WBC: 7.7 10*3/uL (ref 4.0–10.5)

## 2012-02-28 NOTE — Progress Notes (Signed)
Subjective: Patient seen and examined this morning. Still c/o left arm pain. Her sling is ill fitted and she moves it frequently causing her pain.  Has not had BM since yesterday  note to be bradycardic to 40s on tele overnight  Objective:  Vital signs in last 24 hours:  Filed Vitals:   02/27/12 2242 02/27/12 2345 02/28/12 0110 02/28/12 0631  BP: 102/58  107/68 105/67  Pulse: 55  64 63  Temp:  98.4 F (36.9 C) 98.4 F (36.9 C) 98.2 F (36.8 C)  TempSrc:  Oral Oral Oral  Resp: 11  17 19   Height:      Weight:      SpO2: 100%  93% 91%    Intake/Output from previous day:   Intake/Output Summary (Last 24 hours) at 02/28/12 0950 Last data filed at 02/28/12 0631  Gross per 24 hour  Intake  880.5 ml  Output   1400 ml  Net -519.5 ml    Physical Exam: Physical Exam:  General: Elderly female lying in bed in no acute distress  Head eye ENT: No pallor no icterus moist oral mucosa  Cardiovascular: RRR, S1 normal, S2 normal, no MRG, pulses symmetric and intact bilaterally  Pulmonary/Chest: CTAB, no wheezes, rales, or rhonchi  Abdominal: Soft. Non-tender, non-distended, bowel sounds are normal, no masses, organomegaly, or guarding present.  GU: no CVA tenderness, urostomy bag in place.  Musculoskeletal: No joint deformities, erythema, or stiffness, ROM full and no nontender Ext: ecchymoses over left arm with sling, no edema and no cyanosis, pulses palpable bilaterally (DP and PT)  Hematology: no cervical, inginal, or axillary adenopathy.  Neurological: Alert and awake but pleasantly confuse,. Non focal   Lab Results:  Basic Metabolic Panel:    Component Value Date/Time   NA 143 02/28/2012 0610   NA 143 07/01/2010 0949   K 3.8 02/28/2012 0610   K 4.2 07/01/2010 0949   CL 108 02/28/2012 0610   CL 99 07/01/2010 0949   CO2 29 02/28/2012 0610   CO2 33 07/01/2010 0949   BUN 23 02/28/2012 0610   BUN 13 07/01/2010 0949   CREATININE 0.71 02/28/2012 0610   CREATININE 0.7 07/01/2010 0949   GLUCOSE 99 02/28/2012 0610   GLUCOSE 105 07/01/2010 0949   CALCIUM 8.6 02/28/2012 0610   CALCIUM 9.4 07/01/2010 0949   CBC:    Component Value Date/Time   WBC 6.8 02/28/2012 0610   WBC 2.5* 12/23/2011 0943   WBC 3.5* 06/26/2008 1407   HGB 8.2* 02/28/2012 0610   HGB 12.0 12/23/2011 0943   HGB 11.0* 06/26/2008 1407   HCT 26.0* 02/28/2012 0610   HCT 35.3 12/23/2011 0943   HCT 32.8* 06/26/2008 1407   PLT 121* 02/28/2012 0610   PLT 60* 12/23/2011 0943   PLT 93* 06/26/2008 1407   MCV 91.5 02/28/2012 0610   MCV 90 12/23/2011 0943   MCV 80.3* 06/26/2008 1407   NEUTROABS 6.6 02/26/2012 1625   NEUTROABS 1.6 12/23/2011 0943   NEUTROABS 2.2 06/26/2008 1407   LYMPHSABS 3.0 02/26/2012 1625   LYMPHSABS 0.6* 12/23/2011 0943   LYMPHSABS 0.9 06/26/2008 1407   MONOABS 1.2* 02/26/2012 1625   MONOABS 0.4 06/26/2008 1407   EOSABS 0.1 02/26/2012 1625   EOSABS 0.1 12/23/2011 0943   EOSABS 0.1 06/26/2008 1407   BASOSABS 0.1 02/26/2012 1625   BASOSABS 0.0 12/23/2011 0943   BASOSABS 0.0 06/26/2008 1407    Recent Results (from the past 240 hour(s))  MRSA PCR SCREENING     Status:  Normal   Collection Time   02/27/12  1:05 AM      Component Value Range Status Comment   MRSA by PCR NEGATIVE  NEGATIVE  Final     Studies/Results: No results found.  Medications: Scheduled Meds:   . sodium chloride   Intravenous STAT  . cholecalciferol  1,000 Units Oral Daily  . omega-3 acid ethyl esters  1 g Oral Daily  . pantoprazole (PROTONIX) IV  40 mg Intravenous Q12H  . sodium chloride  3 mL Intravenous Q12H  . DISCONTD: nadolol  40 mg Oral Daily   Continuous Infusions:   . sodium chloride    . DISCONTD: octreotide (SANDOSTATIN) infusion 25 mcg/hr (02/27/12 0700)  . DISCONTD: pantoprozole (PROTONIX) infusion 8 mg/hr (02/27/12 1021)   PRN Meds:.diphenhydrAMINE, HYDROmorphone (DILAUDID) injection, ondansetron (ZOFRAN) IV, traMADol, DISCONTD: butamben-tetracaine-benzocaine, DISCONTD: fentaNYL, DISCONTD: midazolam  Assessment/Plan  *Acute blood loss  anemia  -secondary to melena most likely due to erosive gastritis with NSAIDs use . Presented with hb of 6.7 ( last hb 3 months back of 12) -patient admitted to stepdown monitoring and placed on PPI and octeotride drip -Hemoglobin improved to 8.1 with 2 u PRBC transfusion -[facility] and had EGD done on 6/7 which showed to proximal gastric ulcers which were nonbleeding, with mild portal gastropathy in the proximal most, and mild bulbar duodenitis. Moderate dysphasia that were seen with no stigmata of hemorrhage. GI recommends that the bleeding is possibly due to recent bleeding proximal gastric ulcer cannot rule out esophageal variceal bleed however there is no active bleeding.  - monitor H&H Q 12 Hourly. Currently hemodynamically stable . Hb stable at 8.2 today -recommend to continue PPI and a beta blocker prophylaxis of bleeding varices. Gastric bx for H pylori sent.  -added nadolol on 6/7 but patient bradycardic down to 40s. Will d/c nadolol Patient also has non alcoholic steatohepatitis with cirrhotic changes and varices on recent CT.  -Avoid NSAIDs  -d/ced octerotride drip and switch protonix drip to IV bid.  -transferred to tele  Cirrhosis  Hx of non etoh steatosis with cirrhotic chagnes on recent CT.  LFTs wnl. Mildly elevate INR. Esophageal varices noted , GI recommend BB for prophylaxis. Added nadolol but discontinue this am as HR low in 40s on tele Rest of the plan as outlined above   Left humerus fracture  seen by Dr Percell Miller recently and arm sling applied . Has pain and discomfort .  Spoke with Dr Curt Bears on 6/7 who will see patient today. Ortho tech informed about sling adjustment.. no NSAIDs. Allergie to morphine and codeine. continue prn tramadol   Diarrhea  -watery diarrhea for almost a week after returning from cruise. Possibly viral gastroenteritis  No resolved   Colon cancer  Currently stable and follows with Dr Maisie Fus as outpatient. Family informs her  last colonoscopy was 6 months back   Anemia, iron deficiency  Gets IV iron as outpatient with Dr [name]  Hb of 8.2 today. If drops further tomorrow will transfuse  Thrombocytopenia  Currently stable  Hx of bladder ca  currently stable and has a urostomy bag   Ventral hernia without obstruction or gangrene  Patient follows with her general surgeon as outpatient. No plans on surgery   Hypertension  Hold BP meds  Dementia/ ? Depression  on celexa. hold given prolonged qtc   DVT prophylaxis: SCD boots   Diet: low sodium  Full code   Plan discussed with patient and family at bedside  LOS: 2 days   Cindee Mclester 02/28/2012, 9:50 AM

## 2012-02-28 NOTE — Progress Notes (Signed)
Patient transferred to [id number].

## 2012-02-28 NOTE — Consult Note (Signed)
Reason for Consult:Left shoulder pain, proximal humerus fracture Referring Physician: Dr. Scherrie Gerlach is an 71 y.o. female.  HPI: 71 y/o female with hx of HTN, adenocarcinoma of colon s/p end anastomosis, bladder ca s/p surgery with a urostomy, progressive dementia, iron deficiency anemia ( gets IV iron as outpatient and follows with Dr [name]), non alcoholic steatohepatitis with cirrhotic changes and esophageal and splenic varices seen on recent CT scan, thrombocytopenia and multiple ventral hernias was brought into [facility] ED by family for dark tarry stools for past 4 days. Admitted for GI bleed.  Patient also with recent history of left proximal humerus fracture following a fall while boarding a cruise ship approx 2 weeks ago.  Initially taking 850m Ibuprofen tid [place] and started on tramadol instead.  She was placed into a sling, also state they had a swathe at home.  She denies any nausea, vomiting or hematemesis. Denies any fever, headache, dizziness, blurry visions but feels weak. Denies any abdominal pain, SOB , chest pain or palpitations.    Past Medical History  Diagnosis Date  . Anemia, iron deficiency 08/26/2011  . Cirrhosis 08/26/2011  . Colon cancer 2001  . Bladder cancer 2003  . Hypertension   . Memory loss     d/t chemotherapy   . Depression   . Thrombocytopenia   . Osteoporosis   . Blood transfusion   . Arthritis   . Complication of anesthesia     confusion x 2 to 3 days    Past Surgical History  Procedure Date  . Colon surgery 2004  . Total knee arthroplasty 2010    Left knee  . Colon surgery 2001  . Colostomy   . Bladder surgery     Family History  Problem Relation Age of Onset  . Diabetes Brother   . Cancer Other   . Hypertension Other     Social History:  reports that she quit smoking about 20 years ago. She has never used smokeless tobacco. She reports that she does not drink alcohol or  use illicit drugs.  Allergies:  Allergies  Allergen Reactions  . Codeine Swelling and Rash    Swelling in throat  . Morphine And Related Swelling and Rash  . Nutritional Supplements Anaphylaxis  . Promethazine Hcl     Per MD chart - 08/08/11    Medications: I have reviewed the patient's current medications.  Results for orders placed during the hospital encounter of 02/26/12 (from the past 48 hour(s))  COMPREHENSIVE METABOLIC PANEL     Status: Abnormal   Collection Time   02/26/12  4:25 PM      Component Value Range Comment   Sodium 139  135 - 145 (mEq/L)    Potassium 3.7  3.5 - 5.1 (mEq/L)    Chloride 103  96 - 112 (mEq/L)    CO2 27  19 - 32 (mEq/L)    Glucose, Bld 129 (*) 70 - 99 (mg/dL)    BUN 40 (*) 6 - 23 (mg/dL)    Creatinine, Ser 0.64  0.50 - 1.10 (mg/dL)    Calcium 9.4  8.4 - 10.5 (mg/dL)    Total Protein 5.2 (*) 6.0 - 8.3 (g/dL)    Albumin 2.6 (*) 3.5 - 5.2 (g/dL)    AST 26  0 - 37 (U/L)    ALT 16  0 - 35 (U/L)    Alkaline Phosphatase 78  39 - 117 (U/L)  Total Bilirubin 1.0  0.3 - 1.2 (mg/dL)    GFR calc non Af Amer 87 (*) >90 (mL/min)    GFR calc Af Amer >90  >90 (mL/min)   CBC     Status: Abnormal   Collection Time   02/26/12  4:25 PM      Component Value Range Comment   WBC 10.9 (*) 4.0 - 10.5 (K/uL)    RBC 2.32 (*) 3.87 - 5.11 (MIL/uL)    Hemoglobin 6.7 (*) 12.0 - 15.0 (g/dL)    HCT 22.6 (*) 36.0 - 46.0 (%)    MCV 97.4  78.0 - 100.0 (fL)    MCH 28.9  26.0 - 34.0 (pg)    MCHC 29.6 (*) 30.0 - 36.0 (g/dL)    RDW 17.1 (*) 11.5 - 15.5 (%)    Platelets 154  150 - 400 (K/uL)   DIFFERENTIAL     Status: Abnormal   Collection Time   02/26/12  4:25 PM      Component Value Range Comment   Neutrophils Relative 61  43 - 77 (%)    Neutro Abs 6.6  1.7 - 7.7 (K/uL)    Lymphocytes Relative 28  12 - 46 (%)    Lymphs Abs 3.0  0.7 - 4.0 (K/uL)    Monocytes Relative 11  3 - 12 (%)    Monocytes Absolute 1.2 (*) 0.1 - 1.0 (K/uL)    Eosinophils Relative 1  0 - 5 (%)     Eosinophils Absolute 0.1  0.0 - 0.7 (K/uL)    Basophils Relative 1  0 - 1 (%)    Basophils Absolute 0.1  0.0 - 0.1 (K/uL)    RBC Morphology POLYCHROMASIA PRESENT     TYPE AND SCREEN     Status: Normal (Preliminary result)   Collection Time   02/26/12  4:25 PM      Component Value Range Comment   ABO/RH(D) A POS      Antibody Screen NEG      Sample Expiration 02/29/2012      Unit Number [id number]      Blood Component Type RED CELLS,LR      Unit division 00      Status of Unit ISSUED      Transfusion Status OK TO TRANSFUSE      Crossmatch Result Compatible      Unit Number [id number]      Blood Component Type RED CELLS,LR      Unit division 00      Status of Unit ISSUED,FINAL      Transfusion Status OK TO TRANSFUSE      Crossmatch Result Compatible      Unit Number [id number]      Blood Component Type RED CELLS,LR      Unit division 00      Status of Unit ALLOCATED      Transfusion Status OK TO TRANSFUSE      Crossmatch Result Compatible      Unit Number [id number]      Blood Component Type RED CELLS,LR      Unit division 00      Status of Unit ALLOCATED      Transfusion Status OK TO TRANSFUSE      Crossmatch Result Compatible     ABO/RH     Status: Normal   Collection Time   02/26/12  4:25 PM      Component Value Range Comment   ABO/RH(D) A POS     PREPARE  RBC (CROSSMATCH)     Status: Normal   Collection Time   02/26/12  4:30 PM      Component Value Range Comment   Order Confirmation ORDER PROCESSED BY BLOOD BANK     PROTIME-INR     Status: Abnormal   Collection Time   02/26/12  7:10 PM      Component Value Range Comment   Prothrombin Time 19.7 (*) 11.6 - 15.2 (seconds)    INR 1.64 (*) 0.00 - 1.49    CBC     Status: Abnormal   Collection Time   02/27/12 12:40 AM      Component Value Range Comment   WBC 8.7  4.0 - 10.5 (K/uL)    RBC 2.42 (*) 3.87 - 5.11 (MIL/uL)    Hemoglobin 7.1 (*) 12.0 - 15.0 (g/dL)    HCT 21.5 (*) 36.0 - 46.0 (%)    MCV 88.8  78.0 - 100.0 (fL)    MCH  29.3  26.0 - 34.0 (pg)    MCHC 33.0  30.0 - 36.0 (g/dL)    RDW 17.0 (*) 11.5 - 15.5 (%)    Platelets 100 (*) 150 - 400 (K/uL)   MRSA PCR SCREENING     Status: Normal   Collection Time   02/27/12  1:05 AM      Component Value Range Comment   MRSA by PCR NEGATIVE  NEGATIVE    PREPARE RBC (CROSSMATCH)     Status: Normal   Collection Time   02/27/12  2:30 AM      Component Value Range Comment   Order Confirmation ORDER PROCESSED BY BLOOD BANK     COMPREHENSIVE METABOLIC PANEL     Status: Abnormal   Collection Time   02/27/12  7:51 AM      Component Value Range Comment   Sodium 140  135 - 145 (mEq/L)    Potassium 3.4 (*) 3.5 - 5.1 (mEq/L)    Chloride 105  96 - 112 (mEq/L)    CO2 26  19 - 32 (mEq/L)    Glucose, Bld 97  70 - 99 (mg/dL)    BUN 32 (*) 6 - 23 (mg/dL)    Creatinine, Ser 0.63  0.50 - 1.10 (mg/dL)    Calcium 8.8  8.4 - 10.5 (mg/dL)    Total Protein 5.2 (*) 6.0 - 8.3 (g/dL)    Albumin 2.7 (*) 3.5 - 5.2 (g/dL)    AST 28  0 - 37 (U/L)    ALT 15  0 - 35 (U/L)    Alkaline Phosphatase 77  39 - 117 (U/L)    Total Bilirubin 2.4 (*) 0.3 - 1.2 (mg/dL)    GFR calc non Af Amer 87 (*) >90 (mL/min)    GFR calc Af Amer >90  >90 (mL/min)   CBC     Status: Abnormal   Collection Time   02/27/12  7:51 AM      Component Value Range Comment   WBC 6.0  4.0 - 10.5 (K/uL)    RBC 2.71 (*) 3.87 - 5.11 (MIL/uL)    Hemoglobin 8.0 (*) 12.0 - 15.0 (g/dL)    HCT 23.9 (*) 36.0 - 46.0 (%)    MCV 88.2  78.0 - 100.0 (fL)    MCH 29.5  26.0 - 34.0 (pg)    MCHC 33.5  30.0 - 36.0 (g/dL)    RDW 17.0 (*) 11.5 - 15.5 (%)    Platelets 81 (*) 150 - 400 (K/uL) CONSISTENT WITH PREVIOUS  RESULT  CBC     Status: Abnormal   Collection Time   02/27/12  6:35 PM      Component Value Range Comment   WBC 6.2  4.0 - 10.5 (K/uL)    RBC 2.85 (*) 3.87 - 5.11 (MIL/uL)    Hemoglobin 8.2 (*) 12.0 - 15.0 (g/dL)    HCT 25.6 (*) 36.0 - 46.0 (%)    MCV 89.8  78.0 - 100.0 (fL)    MCH 28.8  26.0 - 34.0 (pg)    MCHC 32.0  30.0 - 36.0  (g/dL)    RDW 17.4 (*) 11.5 - 15.5 (%)    Platelets PLATELET CLUMPS NOTED ON SMEAR  150 - 400 (K/uL)   BASIC METABOLIC PANEL     Status: Abnormal   Collection Time   02/28/12  6:10 AM      Component Value Range Comment   Sodium 143  135 - 145 (mEq/L)    Potassium 3.8  3.5 - 5.1 (mEq/L)    Chloride 108  96 - 112 (mEq/L)    CO2 29  19 - 32 (mEq/L)    Glucose, Bld 99  70 - 99 (mg/dL)    BUN 23  6 - 23 (mg/dL)    Creatinine, Ser 0.71  0.50 - 1.10 (mg/dL)    Calcium 8.6  8.4 - 10.5 (mg/dL)    GFR calc non Af Amer 84 (*) >90 (mL/min)    GFR calc Af Amer >90  >90 (mL/min)   PROTIME-INR     Status: Normal   Collection Time   02/28/12  6:10 AM      Component Value Range Comment   Prothrombin Time 14.6  11.6 - 15.2 (seconds)    INR 1.12  0.00 - 1.49    CBC     Status: Abnormal   Collection Time   02/28/12  6:10 AM      Component Value Range Comment   WBC 6.8  4.0 - 10.5 (K/uL)    RBC 2.84 (*) 3.87 - 5.11 (MIL/uL)    Hemoglobin 8.2 (*) 12.0 - 15.0 (g/dL)    HCT 26.0 (*) 36.0 - 46.0 (%)    MCV 91.5  78.0 - 100.0 (fL)    MCH 28.9  26.0 - 34.0 (pg)    MCHC 31.5  30.0 - 36.0 (g/dL)    RDW 17.7 (*) 11.5 - 15.5 (%)    Platelets 121 (*) 150 - 400 (K/uL)     No results found.  Review of Systems  Constitutional: Negative for fever and chills.  Eyes: Negative for blurred vision.  Respiratory: Negative for shortness of breath.   Cardiovascular: Negative for chest pain and palpitations.  Gastrointestinal: Positive for diarrhea and blood in stool. Negative for nausea, vomiting and abdominal pain.  Genitourinary: Negative for dysuria.  Musculoskeletal: Positive for myalgias, joint pain and falls.  Skin: Negative for rash.  Neurological: Positive for weakness. Negative for dizziness, loss of consciousness and headaches.   Blood pressure 105/67, pulse 63, temperature 98.2 F (36.8 C), temperature source Oral, resp. rate 19, height 5' 5"  (1.651 m), weight 101.7 kg (224 lb 3.3 oz), SpO2  91.00%. Physical Exam  Constitutional: She is oriented to person, place, and time. She appears well-developed and well-nourished. No distress.  HENT:  Head: Normocephalic and atraumatic.  Eyes: Pupils are equal, round, and reactive to light.  Neck: Normal range of motion. Neck supple.  Cardiovascular: Normal rate.   Respiratory: Effort normal.  GI: Soft. She exhibits  no distension. There is no tenderness.  Musculoskeletal:       Left shoulder: She exhibits decreased range of motion, tenderness, bony tenderness, swelling and decreased strength.       Examination LUE shows NVI, painful arc of motion, active motion of shoulder intact but painful, FF 40-45 degrees, abduction 40 degrees.  Ecchymosis and TTP over shoulder and upper arm.    Neurological: She is alert and oriented to person, place, and time.  Skin: Skin is warm and dry.  Psychiatric: She has a normal mood and affect.       Mild confusion hx of early dementia    Images:  No new images obtained, xrays taken at ortho office on 02/20/12 were reviewed, 2 views left shoulder show nondisplaced avulsion type fracture of the tuberosity.    Assessment/Plan: Left nondisplaced proximal humerus fracture, avulsion of greater tuberosity  Patient denies any new falls since last seen by Dr. Alfonso Ramus having the same discomfort she has had since the initial injury.  The 129m Tramadol she is taking does "take the edge off".  We discussed proper sling wear as it is loosely fitting today on exam, she may also use the immobilizer strap for additional support until her upcoming f/u with ortho this coming week.  We briefly discussed normal treatment plan and recovery process for this type of injury.  Explained it is normal to have more pain the first 3-4 weeks after this type of injury.  She will continue with current treatment plan- tramadol for pain prn, sling/swathe, f/u ortho outpatient.    CChriss Czar6/04/2012, 10:23 AM

## 2012-02-29 DIAGNOSIS — K922 Gastrointestinal hemorrhage, unspecified: Secondary | ICD-10-CM | POA: Diagnosis present

## 2012-02-29 DIAGNOSIS — S42209A Unspecified fracture of upper end of unspecified humerus, initial encounter for closed fracture: Secondary | ICD-10-CM

## 2012-02-29 DIAGNOSIS — K7460 Unspecified cirrhosis of liver: Secondary | ICD-10-CM

## 2012-02-29 DIAGNOSIS — K250 Acute gastric ulcer with hemorrhage: Secondary | ICD-10-CM | POA: Diagnosis present

## 2012-02-29 DIAGNOSIS — R112 Nausea with vomiting, unspecified: Secondary | ICD-10-CM

## 2012-02-29 DIAGNOSIS — K2901 Acute gastritis with bleeding: Secondary | ICD-10-CM

## 2012-02-29 DIAGNOSIS — I8500 Esophageal varices without bleeding: Secondary | ICD-10-CM | POA: Diagnosis present

## 2012-02-29 LAB — BASIC METABOLIC PANEL
CO2: 24 mEq/L (ref 19–32)
Calcium: 9.2 mg/dL (ref 8.4–10.5)
GFR calc Af Amer: >90 mL/min (ref >90)
GFR calc non Af Amer: 87 mL/min — ABNORMAL LOW (ref >90)
Sodium: 138 mEq/L (ref 135–145)

## 2012-02-29 LAB — CBC
MCH: 28.9 pg (ref 26.0–34.0)
MCHC: 32.1 g/dL (ref 30.0–36.0)
Platelets: 88 10*3/uL — ABNORMAL LOW (ref 150–400)
RBC: 2.94 MIL/uL — ABNORMAL LOW (ref 3.87–5.11)
RDW: 17.6 % — ABNORMAL HIGH (ref 11.5–15.5)

## 2012-02-29 LAB — MAGNESIUM: Magnesium: 1.7 mg/dL (ref 1.5–2.5)

## 2012-02-29 MED ORDER — PANTOPRAZOLE SODIUM 40 MG PO TBEC: 40.0000 mg | DELAYED_RELEASE_TABLET | Freq: Two times a day (BID) | ORAL | Status: DC

## 2012-02-29 MED ORDER — PANTOPRAZOLE SODIUM 40 MG PO TBEC: 40.0000 mg | DELAYED_RELEASE_TABLET | Freq: Every day | ORAL | Status: DC

## 2012-02-29 MED ORDER — TRAMADOL HCL 50 MG PO TABS: 100.0000 mg | ORAL_TABLET | ORAL | Status: DC | PRN

## 2012-02-29 NOTE — Discharge Instructions (Addendum)
Gastrointestinal Bleeding Gastrointestinal (GI) bleeding is bleeding from the gut or any place between your mouth and anus. If bleeding is slow, you may be allowed to go home. If there is a lot of bleeding, hospitalization and observation are often required. SYMPTOMS   You vomit bright red blood or material that looks like coffee grounds.   You have blood in your stools or the stools look black and tarry.  DIAGNOSIS  Your caregiver may diagnose your condition by taking a history and a physical exam. More tests may be needed, including:  X-rays.   EGD (esophagogastroduodenoscopy), which looks at your esophagus, stomach, and small bowel through a flexible telescope-like instrument.   Colonoscopy, which looks at your colon/large bowel through a flexible telescope-like instrument.   Biopsies, which remove a small sample of tissue to examine under a microscope.  Finding out the results of your test Not all test results are available during your visit. If your test results are not back during the visit, make an appointment with your caregiver to find out the results. Do not assume everything is normal if you have not heard from your caregiver or the medical facility. It is important for you to follow up on all of your test results. HOME CARE INSTRUCTIONS   Follow instructions as suggested by your caregiver regarding medicines. Do not take aspirin, drink alcohol, or take medicines for pain and arthritis unless your caregiver says it is okay.   Get the suggested follow-up care when the tests are done.  SEEK IMMEDIATE MEDICAL CARE IF:   Your bleeding increases or you become lightheaded, weak, or pass out (faint).   You experience severe cramps in your stomach, back, or belly (abdomen).   You pass large clots.   The problems which brought you in for medical care get worse.  MAKE SURE YOU:   Understand these instructions.   Will watch your condition.   Will get help right away if you are  not doing well or get worse.  Document Released: 09/05/2000 Document Revised: 08/28/2011 Document Reviewed: 08/18/2011 [facility] Patient Information 2012 [place], [place].

## 2012-02-29 NOTE — Progress Notes (Signed)
Subjective: No blood in stool.  Objective: Vital signs in last 24 hours: Temp:  [97.9 F (36.6 C)-98.5 F (36.9 C)] 98.5 F (36.9 C) (06/09 0600) Pulse Rate:  [61-63] 63  (06/09 0600) Resp:  [18] 18  (06/09 0600) BP: (79-128)/(50-73) 128/73 mmHg (06/09 0600) SpO2:  [94 %-97 %] 94 % (06/09 0600) Weight change:  Last BM Date: 02/26/12  PE: GEN:  Overweight, left arm in sling HEENT:  Slight scleral icterus  Lab Results: CMP     Component Value Date/Time   NA 138 02/29/2012 1157   NA 143 07/01/2010 0949   K 4.0 02/29/2012 1157   K 4.2 07/01/2010 0949   CL 106 02/29/2012 1157   CL 99 07/01/2010 0949   CO2 24 02/29/2012 1157   CO2 33 07/01/2010 0949   GLUCOSE 155* 02/29/2012 1157   GLUCOSE 105 07/01/2010 0949   BUN 21 02/29/2012 1157   BUN 13 07/01/2010 0949   CREATININE 0.65 02/29/2012 1157   CREATININE 0.7 07/01/2010 0949   CALCIUM 9.2 02/29/2012 1157   CALCIUM 9.4 07/01/2010 0949   PROT 5.2* 02/27/2012 0751   PROT 6.5 07/01/2010 0949   ALBUMIN 2.7* 02/27/2012 0751   AST 28 02/27/2012 0751   AST 24 07/01/2010 0949   ALT 15 02/27/2012 0751   ALKPHOS 77 02/27/2012 0751   ALKPHOS 87* 07/01/2010 0949   BILITOT 2.4* 02/27/2012 0751   BILITOT 1.60 07/01/2010 0949   GFRNONAA 87* 02/29/2012 1157   GFRAA >90 02/29/2012 1157   Assessment:  1.  Cirrhosis, seemingly NAFLD. 2.  Melena, seemingly due to NSAID-related gastric ulcers.  No recurrent bleeding. 3.  Non-bleeding esophageal varices.  Intolerant of nadolol.  Plan:  1.  OK to discharge today from GI perspective.  Would make sure she stays on Protonix for the next 6 weeks. 2.  Avoid NSAIDs. 3.  Will arrange office follow-up with Dr. [name]. 4.  Will sign-off.  Please call with any questions.   [name] 02/29/2012, 2:07 PM

## 2012-02-29 NOTE — Discharge Summary (Signed)
Patient ID: [name] MRN: [id number] DOB/AGE: 1940-09-24 71 y.o.  Admit date: 02/26/2012 Discharge date: 02/29/2012  Primary Care Physician:  Florina Ou, MD, MD  Discharge Diagnoses:     Principal Problem:  *Acute upper GI bleeding secondary to NSAID induced bleeding gastric ulcer  Active Problems:  Acute blood loss anemia  Varices, esophageal  Fracture of humerus  Dementia  Colon cancer  Bladder cancer  Thrombocytopenia  Anemia, iron deficiency  Ventral hernia without obstruction or gangrene  Cirrhosis, non-alcoholic  Diarrhea    Medication List  As of 02/29/2012  2:03 PM   TAKE these medications         CALCIUM + D PO   Take by mouth daily.      Cinnamon 500 MG capsule   Take 500 mg by mouth 2 (two) times daily.      citalopram 40 MG tablet   Commonly known as: CELEXA   Take 40 mg by mouth Daily.      fish oil-omega-3 fatty acids 1000 MG capsule   Take 2 g by mouth daily.                  pantoprazole 40 MG tablet   Commonly known as: PROTONIX   Take 1 tablet (40 mg total) by mouth 2 times a day      ramipril 5 MG capsule   Commonly known as: ALTACE   Take 5 mg by mouth Daily.      traMADol 50 MG tablet   Commonly known as: ULTRAM   Take 2 tablets (100 mg total) by mouth every 4 (four) hours as needed.      Vitamin D3 1000 UNITS Caps   Take 1 capsule by mouth daily.            Disposition and Follow-up: Home with outpatient followup with PCP in one week and GI in 3 weeks And needs followup of her hemoglobin within one week  Consults:   [facility]  Significant Diagnostic Studies:  No results found.  Brief H and P: For complete details please refer to admission H and P, but in brief   Physical Exam on Discharge:  Filed Vitals:   02/28/12 0631 02/28/12 1410 02/28/12 2200 02/29/12 0600  BP: 105/67 79/61 106/50 128/73  Pulse: 63 61 63 63  Temp: 98.2 F (36.8 C) 97.9 F (36.6 C) 98.3 F (36.8 C) 98.5  F (36.9 C)  TempSrc: Oral Oral Oral Oral  Resp: 19 18 18 18   Height:      Weight:      SpO2: 91% 97% 96% 94%     Intake/Output Summary (Last 24 hours) at 02/29/12 1403 Last data filed at 02/29/12 0945  Gross per 24 hour  Intake 977.37 ml  Output   1000 ml  Net -22.63 ml    General: Elderly female lying in bed in no acute distress  Head eye ENT: No pallor no icterus moist oral mucosa  Cardiovascular: RRR, S1 normal, S2 normal, no MRG, pulses symmetric and intact bilaterally  Pulmonary/Chest: CTAB, no wheezes, rales, or rhonchi  Abdominal: Soft. Non-tender, non-distended, bowel sounds are normal, no masses, organomegaly, or guarding present.  GU: no CVA tenderness, urostomy bag in place.  Musculoskeletal: No joint deformities, erythema, or stiffness, ROM full and no nontender Ext: ecchymoses over left arm with sling, no edema and no cyanosis, pulses palpable bilaterally (DP and PT)  Hematology: no cervical, inginal, or axillary adenopathy.  Neurological:  Alert and awake but pleasantly confuse,. Non focal  CBC:    Component Value Date/Time   WBC 8.8 02/29/2012 0616   WBC 2.5* 12/23/2011 0943   WBC 3.5* 06/26/2008 1407   HGB 8.5* 02/29/2012 0616   HGB 12.0 12/23/2011 0943   HGB 11.0* 06/26/2008 1407   HCT 26.5* 02/29/2012 0616   HCT 35.3 12/23/2011 0943   HCT 32.8* 06/26/2008 1407   PLT 88* 02/29/2012 0616   PLT 60* 12/23/2011 0943   PLT 93* 06/26/2008 1407   MCV 90.1 02/29/2012 0616   MCV 90 12/23/2011 0943   MCV 80.3* 06/26/2008 1407   NEUTROABS 6.6 02/26/2012 1625   NEUTROABS 1.6 12/23/2011 0943   NEUTROABS 2.2 06/26/2008 1407   LYMPHSABS 3.0 02/26/2012 1625   LYMPHSABS 0.6* 12/23/2011 0943   LYMPHSABS 0.9 06/26/2008 1407   MONOABS 1.2* 02/26/2012 1625   MONOABS 0.4 06/26/2008 1407   EOSABS 0.1 02/26/2012 1625   EOSABS 0.1 12/23/2011 0943   EOSABS 0.1 06/26/2008 1407   BASOSABS 0.1 02/26/2012 1625   BASOSABS 0.0 12/23/2011 0943   BASOSABS 0.0 06/26/2008 3785    Basic Metabolic Panel:    Component  Value Date/Time   NA 138 02/29/2012 1157   NA 143 07/01/2010 0949   K 4.0 02/29/2012 1157   K 4.2 07/01/2010 0949   CL 106 02/29/2012 1157   CL 99 07/01/2010 0949   CO2 24 02/29/2012 1157   CO2 33 07/01/2010 0949   BUN 21 02/29/2012 1157   BUN 13 07/01/2010 0949   CREATININE 0.65 02/29/2012 1157   CREATININE 0.7 07/01/2010 0949   GLUCOSE 155* 02/29/2012 1157   GLUCOSE 105 07/01/2010 0949   CALCIUM 9.2 02/29/2012 1157   CALCIUM 9.4 07/01/2010 [facility]:   *Acute blood loss anemia  -secondary to melena most likely due to erosive gastritis with NSAIDs use . Presented with hb of 6.7 ( last hb 3 months back of 12)  -patient admitted to stepdown monitoring and placed on PPI and octeotride drip  -Hemoglobin improved to 8.1 with 2 u PRBC transfusion . Stable at 8.5 today. -She was seen by [facility] and had EGD done on 6/7 which showed to proximal gastric ulcers which were nonbleeding, with mild portal gastropathy in the proximal , and mild bulbar duodenitis. Moderate dysphasia that were seen with no stigmata of hemorrhage. GI recommends that the bleeding is possibly due to recent bleeding proximal gastric ulcer cannot rule out esophageal variceal bleed however there is no active bleeding.  -recommend to continue PPI and a beta blocker prophylaxis of bleeding varices. Gastric bx for H pylori sent and should be followed up as outpatient. -added nadolol on 6/7 but patient bradycardic down to 40s thus it was discontinued. Patient also has non alcoholic steatohepatitis with cirrhotic changes and varices on recent CT.  -Avoid NSAIDs  -patient now stable and switched to po protonix BID  and will be discharged on the same for at least 6 weeks follow up with GI as outpatient.  Cirrhosis  Hx of non etoh steatosis with cirrhotic chagnes on recent CT.  LFTs wnl. Mildly elevate INR. Esophageal varices noted , GI recommend BB for prophylaxis. Added nadolol but discontinue this am as HR low in 40s on tele    Followup with GI as outpatient  Left humerus fracture  seen by Dr Percell Miller recently and arm sling applied . Has pain and discomfort .  Seen by orthopedic consult and sling adjusted  no  NSAIDs. Allergie to morphine and codeine. continue prn tramadol  Followup with Dr. Percell Miller as outpatient   Remaining medical issues are stable. she can be discharged home with outpatient followup    Time spent on Discharge: 45 minutes Signed: Louellen Molder 02/29/2012, 2:03 PM

## 2012-03-01 ENCOUNTER — Encounter (HOSPITAL_COMMUNITY): Payer: Self-pay

## 2012-03-01 ENCOUNTER — Encounter (HOSPITAL_COMMUNITY): Payer: Self-pay | Admitting: Gastroenterology

## 2012-03-01 LAB — TYPE AND SCREEN
ABO/RH(D): A POS
Antibody Screen: NEGATIVE
Unit division: 0
Unit division: 0

## 2012-03-05 ENCOUNTER — Encounter (HOSPITAL_COMMUNITY): Payer: Self-pay

## 2012-03-05 ENCOUNTER — Inpatient Hospital Stay (HOSPITAL_COMMUNITY)
Admission: EM | Admit: 2012-03-05 | Discharge: 2012-03-08 | DRG: 378 | Disposition: A | Discharge status: Home / Self Care | Payer: Medicare Other | Attending: Family Medicine | Admitting: Family Medicine

## 2012-03-05 DIAGNOSIS — K922 Gastrointestinal hemorrhage, unspecified: Secondary | ICD-10-CM

## 2012-03-05 DIAGNOSIS — K319 Disease of stomach and duodenum, unspecified: Secondary | ICD-10-CM | POA: Diagnosis present

## 2012-03-05 DIAGNOSIS — R112 Nausea with vomiting, unspecified: Secondary | ICD-10-CM

## 2012-03-05 DIAGNOSIS — D61818 Other pancytopenia: Secondary | ICD-10-CM | POA: Diagnosis present

## 2012-03-05 DIAGNOSIS — Z906 Acquired absence of other parts of urinary tract: Secondary | ICD-10-CM

## 2012-03-05 DIAGNOSIS — D509 Iron deficiency anemia, unspecified: Secondary | ICD-10-CM

## 2012-03-05 DIAGNOSIS — D72819 Decreased white blood cell count, unspecified: Secondary | ICD-10-CM

## 2012-03-05 DIAGNOSIS — I8500 Esophageal varices without bleeding: Secondary | ICD-10-CM

## 2012-03-05 DIAGNOSIS — Z96659 Presence of unspecified artificial knee joint: Secondary | ICD-10-CM

## 2012-03-05 DIAGNOSIS — F068 Other specified mental disorders due to known physiological condition: Secondary | ICD-10-CM | POA: Diagnosis present

## 2012-03-05 DIAGNOSIS — D62 Acute posthemorrhagic anemia: Secondary | ICD-10-CM | POA: Diagnosis present

## 2012-03-05 DIAGNOSIS — Z4789 Encounter for other orthopedic aftercare: Secondary | ICD-10-CM

## 2012-03-05 DIAGNOSIS — T3995XA Adverse effect of unspecified nonopioid analgesic, antipyretic and antirheumatic, initial encounter: Secondary | ICD-10-CM | POA: Diagnosis present

## 2012-03-05 DIAGNOSIS — C679 Malignant neoplasm of bladder, unspecified: Secondary | ICD-10-CM

## 2012-03-05 DIAGNOSIS — R197 Diarrhea, unspecified: Secondary | ICD-10-CM

## 2012-03-05 DIAGNOSIS — D696 Thrombocytopenia, unspecified: Secondary | ICD-10-CM

## 2012-03-05 DIAGNOSIS — Z9049 Acquired absence of other specified parts of digestive tract: Secondary | ICD-10-CM

## 2012-03-05 DIAGNOSIS — K439 Ventral hernia without obstruction or gangrene: Secondary | ICD-10-CM

## 2012-03-05 DIAGNOSIS — K250 Acute gastric ulcer with hemorrhage: Secondary | ICD-10-CM

## 2012-03-05 DIAGNOSIS — K7460 Unspecified cirrhosis of liver: Secondary | ICD-10-CM

## 2012-03-05 DIAGNOSIS — K921 Melena: Secondary | ICD-10-CM

## 2012-03-05 DIAGNOSIS — S42309A Unspecified fracture of shaft of humerus, unspecified arm, initial encounter for closed fracture: Secondary | ICD-10-CM

## 2012-03-05 DIAGNOSIS — Z8551 Personal history of malignant neoplasm of bladder: Secondary | ICD-10-CM

## 2012-03-05 DIAGNOSIS — Z936 Other artificial openings of urinary tract status: Secondary | ICD-10-CM

## 2012-03-05 DIAGNOSIS — Z791 Long term (current) use of non-steroidal anti-inflammatories (NSAID): Secondary | ICD-10-CM

## 2012-03-05 DIAGNOSIS — D649 Anemia, unspecified: Secondary | ICD-10-CM

## 2012-03-05 DIAGNOSIS — I10 Essential (primary) hypertension: Secondary | ICD-10-CM | POA: Diagnosis present

## 2012-03-05 DIAGNOSIS — Z9221 Personal history of antineoplastic chemotherapy: Secondary | ICD-10-CM

## 2012-03-05 DIAGNOSIS — C189 Malignant neoplasm of colon, unspecified: Secondary | ICD-10-CM

## 2012-03-05 DIAGNOSIS — F0390 Unspecified dementia without behavioral disturbance: Secondary | ICD-10-CM

## 2012-03-05 DIAGNOSIS — Z85038 Personal history of other malignant neoplasm of large intestine: Secondary | ICD-10-CM

## 2012-03-05 LAB — DIFFERENTIAL
Basophils Absolute: 0 10*3/uL (ref 0.0–0.1)
Eosinophils Absolute: 0.1 10*3/uL (ref 0.0–0.7)
Lymphocytes Relative: 24 % (ref 12–46)
Monocytes Relative: 17 % — ABNORMAL HIGH (ref 3–12)
Neutrophils Relative %: 57 % (ref 43–77)

## 2012-03-05 LAB — BASIC METABOLIC PANEL WITH GFR
BUN: 11 mg/dL (ref 6–23)
CO2: 29 meq/L (ref 19–32)
Calcium: 9.2 mg/dL (ref 8.4–10.5)
Chloride: 101 meq/L (ref 96–112)
Creatinine, Ser: 0.65 mg/dL (ref 0.50–1.10)
GFR calc Af Amer: >90 mL/min
GFR calc non Af Amer: 87 mL/min — ABNORMAL LOW
Glucose, Bld: 125 mg/dL — ABNORMAL HIGH (ref 70–99)
Potassium: 3.8 meq/L (ref 3.5–5.1)
Sodium: 136 meq/L (ref 135–145)

## 2012-03-05 LAB — CBC
HCT: 22.5 % — ABNORMAL LOW (ref 36.0–46.0)
Hemoglobin: 7.1 g/dL — ABNORMAL LOW (ref 12.0–15.0)
MCH: 28.3 pg (ref 26.0–34.0)
MCHC: 31.6 g/dL (ref 30.0–36.0)
MCV: 89.6 fL (ref 78.0–100.0)
Platelets: 100 10*3/uL — ABNORMAL LOW (ref 150–400)
RBC: 2.51 MIL/uL — ABNORMAL LOW (ref 3.87–5.11)
RDW: 16.3 % — ABNORMAL HIGH (ref 11.5–15.5)
WBC: 2.9 10*3/uL — ABNORMAL LOW (ref 4.0–10.5)

## 2012-03-05 LAB — PREPARE RBC (CROSSMATCH)

## 2012-03-05 LAB — PROTIME-INR
INR: 1.16 (ref 0.00–1.49)
Prothrombin Time: 15 seconds (ref 11.6–15.2)

## 2012-03-05 LAB — RETICULOCYTES
RBC.: 2.63 MIL/uL — ABNORMAL LOW (ref 3.87–5.11)
Retic Count, Absolute: 89.4 10*3/uL (ref 19.0–186.0)

## 2012-03-05 LAB — APTT: aPTT: 40 s — ABNORMAL HIGH (ref 24–37)

## 2012-03-05 LAB — OCCULT BLOOD, POC DEVICE: Fecal Occult Bld: POSITIVE

## 2012-03-05 MED ORDER — ONDANSETRON HCL 4 MG PO TABS: 4.0000 mg | ORAL_TABLET | Freq: Four times a day (QID) | ORAL | Status: DC | PRN

## 2012-03-05 MED ORDER — ONDANSETRON HCL 4 MG/2ML IJ SOLN
4.0000 mg | Freq: Three times a day (TID) | INTRAMUSCULAR | Status: DC | PRN
  Filled 2012-03-05: qty 2

## 2012-03-05 MED ORDER — CITALOPRAM HYDROBROMIDE 20 MG PO TABS
20.0000 mg | ORAL_TABLET | Freq: Every day | ORAL | Status: DC
  Administered 2012-03-06 – 2012-03-08 (×3): 20 mg via ORAL
  Filled 2012-03-05 (×3): qty 1

## 2012-03-05 MED ORDER — SODIUM CHLORIDE 0.9 % IV SOLN
INTRAVENOUS | Status: AC
  Administered 2012-03-05: 21:00:00 via INTRAVENOUS

## 2012-03-05 MED ORDER — RAMIPRIL 5 MG PO CAPS
5.0000 mg | ORAL_CAPSULE | Freq: Every day | ORAL | Status: DC
  Administered 2012-03-06: 5 mg via ORAL
  Filled 2012-03-05: qty 1

## 2012-03-05 MED ORDER — ACETAMINOPHEN 650 MG RE SUPP: 650.0000 mg | Freq: Four times a day (QID) | RECTAL | Status: DC | PRN

## 2012-03-05 MED ORDER — DOCUSATE SODIUM 100 MG PO CAPS
100.0000 mg | ORAL_CAPSULE | Freq: Two times a day (BID) | ORAL | Status: DC
  Administered 2012-03-05 – 2012-03-08 (×6): 100 mg via ORAL
  Filled 2012-03-05 (×7): qty 1

## 2012-03-05 MED ORDER — PANTOPRAZOLE SODIUM 40 MG IV SOLR
40.0000 mg | Freq: Once | INTRAVENOUS | Status: AC
  Administered 2012-03-05: 40 mg via INTRAVENOUS
  Filled 2012-03-05: qty 40

## 2012-03-05 MED ORDER — SODIUM CHLORIDE 0.9 % IV SOLN
INTRAVENOUS | Status: DC
  Administered 2012-03-06: 06:00:00 via INTRAVENOUS

## 2012-03-05 MED ORDER — FENTANYL CITRATE 0.05 MG/ML IJ SOLN
50.0000 ug | Freq: Once | INTRAMUSCULAR | Status: AC
  Administered 2012-03-05: 50 ug via INTRAVENOUS
  Filled 2012-03-05: qty 2

## 2012-03-05 MED ORDER — TRAMADOL HCL 50 MG PO TABS
50.0000 mg | ORAL_TABLET | ORAL | Status: DC | PRN
  Administered 2012-03-05 – 2012-03-06 (×3): 100 mg via ORAL
  Filled 2012-03-05 (×3): qty 2

## 2012-03-05 MED ORDER — PANTOPRAZOLE SODIUM 40 MG IV SOLR
40.0000 mg | Freq: Two times a day (BID) | INTRAVENOUS | Status: DC
  Administered 2012-03-06 – 2012-03-08 (×6): 40 mg via INTRAVENOUS
  Filled 2012-03-05 (×7): qty 40

## 2012-03-05 MED ORDER — ACETAMINOPHEN 325 MG PO TABS: 650.0000 mg | ORAL_TABLET | Freq: Four times a day (QID) | ORAL | Status: DC | PRN

## 2012-03-05 MED ORDER — SENNA 8.6 MG PO TABS
1.0000 | ORAL_TABLET | Freq: Two times a day (BID) | ORAL | Status: DC
  Administered 2012-03-05 – 2012-03-08 (×6): 8.6 mg via ORAL
  Filled 2012-03-05 (×6): qty 1

## 2012-03-05 MED ORDER — ONDANSETRON HCL 4 MG/2ML IJ SOLN: 4.0000 mg | Freq: Four times a day (QID) | INTRAMUSCULAR | Status: DC | PRN

## 2012-03-05 NOTE — ED Notes (Signed)
MD at bedside. Dr. Thad Ranger at bedside.

## 2012-03-05 NOTE — ED Notes (Signed)
Patient reports that she was at a scheduled appointment with Dr. Greta Doom and was told her Hgb dropped from 8.5 to 7.5 since being discharged 5 days ago. Patient was being treated for an ulcer.

## 2012-03-05 NOTE — H&P (Signed)
PCP:  Florina Ou, MD   Confirmed   Chief Complaint:  Sent from PCP for Hgb drop since discharge.   HPI: 89yoF with h/o colon ca s/p end anastamosis, bladder ca s/p  resection/urostomy, dementia, IDA on iron infusions, NASH/cirrhosis with  esophageal and splenic varices, and thrombocytopenia and discharged a few  days ago for UGIB/melena likely due to NSAID gastropathy due to humerus  fracture presents with Hgb drop since discharge.   Pt was last admitted to [facility] on 6/6 with melena in setting of high dose  ibuprofen due to a left humerus fracture. Hgb 6 on admission, given 2u  PRBC's and 8.5 on discharge. EGD done by [facility], showed proximal  gastric ulcers not bleeding, mild gastric gastropathy, bulbar duodenitis  -- likely due to gastric ulcers, cannot r/o esophageal varices although  not actively bleeding. Started nadolol for cirrhosis prophy, but HR down  to 40's so stopped. Sent home on PPI bid, to f/u with GI in 6 wks. Avoid  NSAID's. Hpyolori came back negative.   Pt now comes back after seeing her PCP in the office whose CBC indicates  Hgb drop to 7.5, also WBC appear to be newly low. Pt doesn't give much  history, she is very tearful and flustered but husband states she had  small amt of bloody BM right after discharge but none since then, no  hematemsis or really any new symptoms, no BRBPR, melena, abd pain.   In the ED vitals were stable. Chem normal. WBC acutely low at 2.9 (?),  Hgb down to 7.2 from 8.5 on 6/8, plts 100. INR 1.1. Rectal exam showed  red blood and was fecal occult positive. Pt was given 40 mg IV protonix,  zofran, fentanyl, NS.   As above, no new symptoms. Pt with left arm in sling and it's painful but  taking tramadol. Otherwise ROS stable/negative.    Past Medical History  Diagnosis Date  . Anemia, iron deficiency 08/26/2011  . Cirrhosis 08/26/2011  . Colon cancer 2001  . Bladder cancer 2003  . Hypertension   . Memory loss     d/t  chemotherapy   . Depression   . Thrombocytopenia   . Osteoporosis   . Blood transfusion   . Arthritis   . Complication of anesthesia     confusion x 2 to 3 days    Past Surgical History  Procedure Date  . Colon surgery 2004  . Total knee arthroplasty 2010    Left knee  . Colon surgery 2001  . Colostomy   . Bladder surgery   . Esophagogastroduodenoscopy 02/27/2012    Procedure: ESOPHAGOGASTRODUODENOSCOPY (EGD);  Surgeon: [name], MD;  Location: [name];  Service: Endoscopy;  Laterality: N/A;  bedside case    Medications:  HOME MEDS: Reconciled with husband. Prior to Admission medications   Medication Sig Start Date End Date Taking? Authorizing Provider  Calcium Carbonate-Vitamin D (CALCIUM + D PO) Take by mouth daily.   Yes Historical Provider, MD  Cholecalciferol (VITAMIN D3) 1000 UNITS CAPS Take 1 capsule by mouth daily.     Yes Historical Provider, MD  Cinnamon 500 MG capsule Take 500 mg by mouth 2 (two) times daily.     Yes Historical Provider, MD  citalopram (CELEXA) 40 MG tablet Take 20 mg by mouth Daily. PHARMACIST RECENTLY TOLD THEM TO TAKE 1/2 DOSE (20 MG) INSTEAD OF 40 MG SO HAS BEEN TAKING 20 THIS WEEK 08/19/11  Yes Historical Provider, MD  fish oil-omega-3  fatty acids 1000 MG capsule Take 2 g by mouth daily.     Yes Historical Provider, MD  pantoprazole (PROTONIX) 40 MG tablet Take 1 tablet (40 mg total) by mouth 2 (two) times daily. 02/29/12 02/28/13 Yes Nishant Dhungel, MD  ramipril (ALTACE) 5 MG capsule Take 5 mg by mouth Daily. 08/19/11  Yes Historical Provider, MD  traMADol (ULTRAM) 50 MG tablet Take 50-100 mg by mouth See admin instructions. Take 1 to 2 tablets every 4 to 6 hours as needed for pain. Do not exceed 8 tablets per day. 02/29/12 03/10/12 Yes Nishant Dhungel, MD    Allergies:  Allergies  Allergen Reactions  . Codeine Swelling and Rash    Swelling in throat  . Morphine And Related Swelling and Rash  . Nutritional Supplements Anaphylaxis  .  Promethazine Hcl     Per MD chart - 08/08/11    Social History:   reports that she quit smoking about 20 years ago. She has never used smokeless tobacco. She reports that she does not drink alcohol or use illicit drugs. She lives at home with a husband and has a son  Family History: Family History  Problem Relation Age of Onset  . Diabetes Brother   . Cancer Other   . Hypertension Other     Physical Exam: Filed Vitals:   03/05/12 1642 03/05/12 2003  BP:  127/52  Pulse: 78 76  Temp: 98.5 F (36.9 C) 98.3 F (36.8 C)  TempSrc: Oral Oral  Resp: 16 18  SpO2: 95% 94%   Blood pressure 127/52, pulse 76, temperature 98.3 F (36.8 C), temperature source Oral, resp. rate 18, SpO2 94.00%. Gen: Elderly, larger F in ED stretcher who is crying intermittently  through the interview but is stable overall, well appearing, not acute.  She seems just overwhelmed. Husband provides most history. Pleasant  overall HEENT: Pupils round, reactive, equal, EOMI, sclera clear. Mouth/tongue  appear very dry and parched appearing.  Lungs: Grossly CTAB no w/c/r, good air movement, no cough, no increased  WOB Heart: Regular S1/2, not tachycardic, normal exam Abd: Soft, not tender, various hernia noted, obese, RLQ urostomy bag  noted. No grimacing. Benign Extrem: Various ecchmyoses noted, fair bulk and normal muscle tone, no  BLE edema noted. Radials hard to palpate, LUE is in a sling Neuro: Alert, attnetive, appropriate but upset, speech clear but has hard  time expressing herself due to mood, able to sit up in bed without much  assistance, grossly nonfocal.    Labs & Imaging Results for orders placed during the hospital encounter of 03/05/12 (from the past 48 hour(s))  OCCULT BLOOD, POC DEVICE     Status: Normal   Collection Time   03/05/12  5:30 PM      Component Value Range Comment   Fecal Occult Bld POSITIVE     CBC     Status: Abnormal   Collection Time   03/05/12  6:19 PM      Component  Value Range Comment   WBC 2.9 (*) 4.0 - 10.5 K/uL    RBC 2.51 (*) 3.87 - 5.11 MIL/uL    Hemoglobin 7.1 (*) 12.0 - 15.0 g/dL    HCT 22.5 (*) 36.0 - 46.0 %    MCV 89.6  78.0 - 100.0 fL    MCH 28.3  26.0 - 34.0 pg    MCHC 31.6  30.0 - 36.0 g/dL    RDW 16.3 (*) 11.5 - 15.5 %    Platelets 100 (*)  150 - 400 K/uL   DIFFERENTIAL     Status: Abnormal   Collection Time   03/05/12  6:19 PM      Component Value Range Comment   Neutrophils Relative 57  43 - 77 %    Lymphocytes Relative 24  12 - 46 %    Monocytes Relative 17 (*) 3 - 12 %    Eosinophils Relative 2  0 - 5 %    Basophils Relative 0  0 - 1 %    Neutro Abs 1.6 (*) 1.7 - 7.7 K/uL    Lymphs Abs 0.7  0.7 - 4.0 K/uL    Monocytes Absolute 0.5  0.1 - 1.0 K/uL    Eosinophils Absolute 0.1  0.0 - 0.7 K/uL    Basophils Absolute 0.0  0.0 - 0.1 K/uL    Smear Review PLATELET COUNT CONFIRMED BY SMEAR     BASIC METABOLIC PANEL     Status: Abnormal   Collection Time   03/05/12  6:19 PM      Component Value Range Comment   Sodium 136  135 - 145 mEq/L    Potassium 3.8  3.5 - 5.1 mEq/L    Chloride 101  96 - 112 mEq/L    CO2 29  19 - 32 mEq/L    Glucose, Bld 125 (*) 70 - 99 mg/dL    BUN 11  6 - 23 mg/dL    Creatinine, Ser 0.65  0.50 - 1.10 mg/dL    Calcium 9.2  8.4 - 10.5 mg/dL    GFR calc non Af Amer 87 (*) >90 mL/min    GFR calc Af Amer >90  >90 mL/min   TYPE AND SCREEN     Status: Normal (Preliminary result)   Collection Time   03/05/12  6:19 PM      Component Value Range Comment   ABO/RH(D) A POS      Antibody Screen NEG      Sample Expiration 03/08/2012      Unit Number [id number]      Blood Component Type RED CELLS,LR      Unit division 00      Status of Unit ALLOCATED      Transfusion Status OK TO TRANSFUSE      Crossmatch Result Compatible     APTT     Status: Abnormal   Collection Time   03/05/12  6:19 PM      Component Value Range Comment   aPTT 40 (*) 24 - 37 seconds   PROTIME-INR     Status: Normal   Collection Time    03/05/12  6:19 PM      Component Value Range Comment   Prothrombin Time 15.0  11.6 - 15.2 seconds    INR 1.16  0.00 - 1.49   PREPARE RBC (CROSSMATCH)     Status: Normal   Collection Time   03/05/12  8:30 PM      Component Value Range Comment   Order Confirmation ORDER PROCESSED BY BLOOD BANK     RETICULOCYTES     Status: Abnormal   Collection Time   03/05/12  9:29 PM      Component Value Range Comment   Retic Ct Pct 3.4 (*) 0.4 - 3.1 %    RBC. 2.63 (*) 3.87 - 5.11 MIL/uL    Retic Count, Manual 89.4  19.0 - 186.0 K/uL    No results found.    ECG: NSR 88, normal axis, narrow QRS, no Q  waves, no ST deviations,  normal T waves, overall not ischemic apeparing ECG     Impression Present on Admission:  .Acute blood loss anemia .Acute upper GI bleeding .Anemia, iron deficiency .Thrombocytopenia .Leukopenia .Cirrhosis, non-alcoholic   5WKG with h/o colon ca s/p end anastamosis, bladder ca s/p  resection/urostomy, dementia, IDA on iron infusions, NASH/cirrhosis with  esophageal and splenic varices, and thrombocytopenia and discharged a few  days ago for UGIB/melena likely due to NSAID gastropathy due to humerus  fracture presents with Hgb drop since discharge.   1. Anemia, likey UGIB: This likely represents a slow chronic ooze in the  setting of recent known melena and gastric ulcer seen on EGD. DDx would  include variceal bleeding, but she hasn't had hemorrhage or any other  symptoms, makes this less likely.   - Will start protonix IV BID, and transfuse 1u PRBC's, trend, and watch  for further bloody stools. Clear liquid diet - Have run this by [facility] who recommends the same, if not really having  active apparent bleeding would be conservative, can call back if it gets  more active.  - Will get repeat iron studies before transfusion, and if significant  would get in touch with Dr. [name] as outpt for infusion, last was 6 mos  ago per husband (or give her IV iron while  admitted) -- however of note  it may make her stools blacker, thus confusing the picture  2. Leukopenia: WBC down to 2-3 from 7-8 on discharge. However, it appears  she lives more in the 2-4 range and I suspect the 7-8 was stress  reaction. She seems to ahve baseline leukopenia that's probably from  cirrhosis. Therefore, likely baseline, will monitor.   3. H/o cirrhosis: As above, don't suspect this is variceal.  Thrombocytopenia at baseline.   4. HTN: BP stable, continue ramipril  5. Conintue celexa at 20 mg daily. Husband states a pharmacist told them  to decrease the dose to 20 while taking Tramadol but pt usually takes 40.   6. Humerus fracture: This was evaluated again by ortho last admission.  Tylenol and tramadol for pain  No DVT prophy for bleeding, ambulate pt  Regular bed, [facility] team 6 Presumed full code  [place], [place] 03/05/2012, 9:49 PM

## 2012-03-05 NOTE — ED Provider Notes (Addendum)
History     CSN: [id number]  Arrival date & time 03/05/12  1633   First MD Initiated Contact with Patient 03/05/12 1700      Chief Complaint  Patient presents with  . Low Hemoglobin     (Consider location/radiation/quality/duration/timing/severity/associated sxs/prior treatment) HPI Comments: Patient presents for low hemoglobin today.  She was recently admitted for GI bleeding which required blood transfusion.  Patient was discharged on Sunday.  This week she has felt well and has had no changes.  She denies any known bloody or black stools.  She has no abdominal pain.  No nausea or vomiting.  No increasing weakness, fatigue, chest pain or shortness of breath.  Today she went to her primary care physician's office for a followup appointment in May we checked her hemoglobin and it was 7.5.  Due to this decrease from her hemoglobin at discharge patient was referred back to the emergency department for evaluation.  The history is provided by the patient.    Past Medical History  Diagnosis Date  . Anemia, iron deficiency 08/26/2011  . Cirrhosis 08/26/2011  . Colon cancer 2001  . Bladder cancer 2003  . Hypertension   . Memory loss     d/t chemotherapy   . Depression   . Thrombocytopenia   . Osteoporosis   . Blood transfusion   . Arthritis   . Complication of anesthesia     confusion x 2 to 3 days    Past Surgical History  Procedure Date  . Colon surgery 2004  . Total knee arthroplasty 2010    Left knee  . Colon surgery 2001  . Colostomy   . Bladder surgery   . Esophagogastroduodenoscopy 02/27/2012    Procedure: ESOPHAGOGASTRODUODENOSCOPY (EGD);  Surgeon: [name], MD;  Location: [name];  Service: Endoscopy;  Laterality: N/A;  bedside case    Family History  Problem Relation Age of Onset  . Diabetes Brother   . Cancer Other   . Hypertension Other     History  Substance Use Topics  . Smoking status: Former Smoker    Quit date: 09/22/1991  . Smokeless  tobacco: Never Used  . Alcohol Use: No    OB History    Grav Para Term Preterm Abortions TAB SAB Ect Mult Living                  Review of Systems  Constitutional: Negative.  Negative for fever and chills.  HENT: Negative.   Eyes: Negative.  Negative for discharge and redness.  Respiratory: Negative.  Negative for cough and shortness of breath.   Cardiovascular: Negative.  Negative for chest pain.  Gastrointestinal: Negative.  Negative for nausea, vomiting, abdominal pain and diarrhea.  Genitourinary: Negative.   Musculoskeletal: Negative.  Negative for back pain.  Skin: Negative.  Negative for color change and rash.  Neurological: Negative.  Negative for syncope and headaches.  Hematological: Negative.  Negative for adenopathy.  Psychiatric/Behavioral: Negative.  Negative for confusion.  All other systems reviewed and are negative.    Allergies  Codeine; Morphine and related; Nutritional supplements; and Promethazine hcl  Home Medications   Current Outpatient Rx  Name Route Sig Dispense Refill  . CALCIUM + D PO Oral Take by mouth daily.    Marland Kitchen VITAMIN D3 1000 UNITS PO CAPS Oral Take 1 capsule by mouth daily.      Marland Kitchen CINNAMON 500 MG PO CAPS Oral Take 500 mg by mouth 2 (two) times daily.      Marland Kitchen  CITALOPRAM HYDROBROMIDE 40 MG PO TABS Oral Take 40 mg by mouth Daily.    . OMEGA-3 FATTY ACIDS 1000 MG PO CAPS Oral Take 2 g by mouth daily.      Marland Kitchen PANTOPRAZOLE SODIUM 40 MG PO TBEC Oral Take 1 tablet (40 mg total) by mouth 2 (two) times daily. 60 tablet 1  . RAMIPRIL 5 MG PO CAPS Oral Take 5 mg by mouth Daily.    . TRAMADOL HCL 50 MG PO TABS Oral Take 50-100 mg by mouth See admin instructions. Take 1 to 2 tablets every 4 to 6 hours as needed for pain. Do not exceed 8 tablets per day.      Pulse 78  Temp 98.5 F (36.9 C) (Oral)  Resp 16  SpO2 95%  Physical Exam  Nursing note and vitals reviewed. Constitutional: She is oriented to person, place, and time. She appears  well-developed and well-nourished.  Non-toxic appearance. She does not have a sickly appearance.  HENT:  Head: Normocephalic and atraumatic.  Eyes: Conjunctivae, EOM and lids are normal. Pupils are equal, round, and reactive to light. No scleral icterus.  Neck: Trachea normal and normal range of motion. Neck supple.  Cardiovascular: Normal rate, regular rhythm and normal heart sounds.   Pulmonary/Chest: Effort normal and breath sounds normal. No respiratory distress. She has no wheezes. She has no rales.  Abdominal: Soft. Normal appearance. There is no tenderness. There is no rebound, no guarding and no CVA tenderness.  Genitourinary:       Normal rectal tone.  Grossly bloody on exam.  Hemoccult positive  Musculoskeletal: Normal range of motion.  Neurological: She is alert and oriented to person, place, and time. She has normal strength.  Skin: Skin is warm, dry and intact. No rash noted.  Psychiatric: She has a normal mood and affect.    ED Course  Procedures (including critical care time)  Results for orders placed during the hospital encounter of 03/05/12  CBC      Component Value Range   WBC 2.9 (*) 4.0 - 10.5 K/uL   RBC 2.51 (*) 3.87 - 5.11 MIL/uL   Hemoglobin 7.1 (*) 12.0 - 15.0 g/dL   HCT 22.5 (*) 36.0 - 46.0 %   MCV 89.6  78.0 - 100.0 fL   MCH 28.3  26.0 - 34.0 pg   MCHC 31.6  30.0 - 36.0 g/dL   RDW 16.3 (*) 11.5 - 15.5 %   Platelets 100 (*) 150 - 400 K/uL  DIFFERENTIAL      Component Value Range   Neutrophils Relative 57  43 - 77 %   Lymphocytes Relative 24  12 - 46 %   Monocytes Relative 17 (*) 3 - 12 %   Eosinophils Relative 2  0 - 5 %   Basophils Relative 0  0 - 1 %   Neutro Abs 1.6 (*) 1.7 - 7.7 K/uL   Lymphs Abs 0.7  0.7 - 4.0 K/uL   Monocytes Absolute 0.5  0.1 - 1.0 K/uL   Eosinophils Absolute 0.1  0.0 - 0.7 K/uL   Basophils Absolute 0.0  0.0 - 0.1 K/uL   Smear Review PLATELET COUNT CONFIRMED BY SMEAR    BASIC METABOLIC PANEL      Component Value Range    Sodium 136  135 - 145 mEq/L   Potassium 3.8  3.5 - 5.1 mEq/L   Chloride 101  96 - 112 mEq/L   CO2 29  19 - 32 mEq/L   Glucose,  Bld 125 (*) 70 - 99 mg/dL   BUN 11  6 - 23 mg/dL   Creatinine, Ser 0.65  0.50 - 1.10 mg/dL   Calcium 9.2  8.4 - 10.5 mg/dL   GFR calc non Af Amer 87 (*) >90 mL/min   GFR calc Af Amer >90  >90 mL/min  TYPE AND SCREEN      Component Value Range   ABO/RH(D) A POS     Antibody Screen NEG     Sample Expiration 03/08/2012    APTT      Component Value Range   aPTT 40 (*) 24 - 37 seconds  PROTIME-INR      Component Value Range   Prothrombin Time 15.0  11.6 - 15.2 seconds   INR 1.16  0.00 - 1.49  OCCULT BLOOD, POC DEVICE      Component Value Range   Fecal Occult Bld POSITIVE          Date: 03/05/2012  Rate: 88  Rhythm: normal sinus rhythm  QRS Axis: normal  Intervals: normal  ST/T Wave abnormalities: normal  Conduction Disutrbances:none  Narrative Interpretation:   Old EKG Reviewed: changes noted from 02-27-12 when QT was prolonged   MDM  Patient with report of anemia with a hemoglobin of 7.5 on her results from her primary care physician's office today.  We will recheck her CBC here.        Lezlie Octave, MD 03/05/12 [phone]  Patient with worsening anemia today on a repeat laboratory studies we've completed here.  She does have bright red blood on my rectal exam.  It appears that she continues to have GI bleed.  She's received protonic see her and has been kept n.p.o.  I believe patient requires admission as she needs transfusion of at least one unit of packed red blood cells and needs continued monitoring for further GI bleeding.  Lezlie Octave, MD 03/05/12 1956  Discussed with Dr. Quay Burow for admission.   Lezlie Octave, MD 03/05/12 2025

## 2012-03-05 NOTE — ED Notes (Signed)
MD at bedside. Dr. Quay Burow, hospitalist, at bedside.

## 2012-03-05 NOTE — ED Notes (Signed)
Pts husband states she was just discharged from the hospital d/t low HGB of 6, then came up to 8.5 when discharged, went to doctor today was told has dropped to 7.5 since then, they wanted pt to come to hospital for possible ulcer/bleed?, denies bloody stools, states has felt tired the past month. Pt has a fractured L arm, in splint, fell about 3 weeks ago.

## 2012-03-06 DIAGNOSIS — R112 Nausea with vomiting, unspecified: Secondary | ICD-10-CM

## 2012-03-06 DIAGNOSIS — D72819 Decreased white blood cell count, unspecified: Secondary | ICD-10-CM

## 2012-03-06 DIAGNOSIS — K922 Gastrointestinal hemorrhage, unspecified: Secondary | ICD-10-CM

## 2012-03-06 DIAGNOSIS — K7460 Unspecified cirrhosis of liver: Secondary | ICD-10-CM

## 2012-03-06 LAB — BASIC METABOLIC PANEL
GFR calc Af Amer: >90 mL/min (ref >90)
GFR calc non Af Amer: 87 mL/min — ABNORMAL LOW (ref >90)
Glucose, Bld: 101 mg/dL — ABNORMAL HIGH (ref 70–99)
Potassium: 4.7 mEq/L (ref 3.5–5.1)
Sodium: 138 mEq/L (ref 135–145)

## 2012-03-06 LAB — VITAMIN B12: Vitamin B-12: 833 pg/mL (ref 211–911)

## 2012-03-06 LAB — CBC
Hemoglobin: 7.5 g/dL — ABNORMAL LOW (ref 12.0–15.0)
Hemoglobin: 8.7 g/dL — ABNORMAL LOW (ref 12.0–15.0)
RBC: 2.61 MIL/uL — ABNORMAL LOW (ref 3.87–5.11)
RBC: 3.03 MIL/uL — ABNORMAL LOW (ref 3.87–5.11)
WBC: 3 10*3/uL — ABNORMAL LOW (ref 4.0–10.5)
WBC: 3.9 10*3/uL — ABNORMAL LOW (ref 4.0–10.5)

## 2012-03-06 LAB — IRON AND TIBC: Iron: 14 ug/dL — ABNORMAL LOW (ref 42–135)

## 2012-03-06 LAB — GLUCOSE, CAPILLARY: Glucose-Capillary: 105 mg/dL — ABNORMAL HIGH (ref 70–99)

## 2012-03-06 LAB — FOLATE: Folate: 11.5 ng/mL

## 2012-03-06 MED ORDER — TRAMADOL HCL 50 MG PO TABS
50.0000 mg | ORAL_TABLET | ORAL | Status: DC
  Administered 2012-03-06: 50 mg via ORAL
  Administered 2012-03-06 – 2012-03-07 (×5): 100 mg via ORAL
  Administered 2012-03-07: 50 mg via ORAL
  Administered 2012-03-07 – 2012-03-08 (×4): 100 mg via ORAL
  Filled 2012-03-06 (×16): qty 2
  Filled 2012-03-06: qty 1
  Filled 2012-03-06: qty 2

## 2012-03-06 MED ORDER — FUROSEMIDE 10 MG/ML IJ SOLN
20.0000 mg | Freq: Once | INTRAMUSCULAR | Status: DC
  Filled 2012-03-06: qty 2

## 2012-03-06 MED ORDER — SODIUM CHLORIDE 0.9 % IV SOLN
INTRAVENOUS | Status: AC
  Administered 2012-03-06: 1000 mL via INTRAVENOUS

## 2012-03-06 MED ORDER — FENTANYL CITRATE 0.05 MG/ML IJ SOLN
12.5000 ug | Freq: Once | INTRAMUSCULAR | Status: AC
  Administered 2012-03-06: 12.5 ug via INTRAVENOUS
  Filled 2012-03-06: qty 2

## 2012-03-06 MED ORDER — METOPROLOL TARTRATE 12.5 MG HALF TABLET
12.5000 mg | ORAL_TABLET | Freq: Two times a day (BID) | ORAL | Status: DC
  Administered 2012-03-06 – 2012-03-08 (×5): 12.5 mg via ORAL
  Filled 2012-03-06 (×6): qty 1

## 2012-03-06 MED ORDER — DIPHENHYDRAMINE HCL 25 MG PO CAPS: 25.0000 mg | ORAL_CAPSULE | Freq: Once | ORAL | Status: DC

## 2012-03-06 MED ORDER — METOPROLOL TARTRATE 12.5 MG HALF TABLET: 12.5000 mg | ORAL_TABLET | Freq: Two times a day (BID) | ORAL | Status: DC

## 2012-03-06 NOTE — Evaluation (Signed)
Physical Therapy Evaluation Patient Details Name: [name] MRN: [id number] DOB: 08/24/1941 Today's Date: 03/06/2012 Time: 3532-9924 PT Time Calculation (min): 12 min  PT Assessment / Plan / Recommendation Clinical Impression  Pt admitted for upper GI bleed.  Pt also with recent fx to L humerus with sling.  Pt appears to have good family support and lives with spouse.  Pt would benefit from acute PT services in order to improve independence with transfers and ambulation to prepare for d/c home with family support.    PT Assessment  Patient needs continued PT services    Follow Up Recommendations  Home health PT    Barriers to Discharge        lEquipment Recommendations  Other (comment) (TBA, possibly cane)    Recommendations for Other Services     Frequency Min 3X/week    Precautions / Restrictions Precautions Precautions: Fall Restrictions Weight Bearing Restrictions: Yes LUE Weight Bearing: Non weight bearing Other Position/Activity Restrictions: sling to L UE   Pertinent Vitals/Pain 5/10 L shoulder pain, repositioned L UE and provided ice      Mobility  Bed Mobility Bed Mobility: Not assessed Transfers Transfers: Sit to Stand;Stand to Sit Sit to Stand: 4: Min guard;With upper extremity assist;From chair/3-in-1 Stand to Sit: 4: Min guard;Without upper extremity assist;To chair/3-in-1 Details for Transfer Assistance: verbal cue for R hand placement Ambulation/Gait Ambulation/Gait Assistance: 4: Min guard Ambulation Distance (Feet): 160 Feet Assistive device: None Ambulation/Gait Assistance Details: pt a little unsteady but no LOB, may try cane next visit, pt reports LE weakness during ambulation with one short rest break required Gait Pattern: Step-through pattern;Decreased stride length    Exercises     PT Diagnosis: Difficulty walking;Acute pain  PT Problem List: Decreased strength;Decreased activity tolerance;Decreased mobility;Decreased  balance;Decreased knowledge of use of DME;Decreased safety awareness;Pain PT Treatment Interventions: DME instruction;Gait training;Functional mobility training;Therapeutic activities;Therapeutic exercise;Balance training;Patient/family education;Neuromuscular re-education   PT Goals Acute Rehab PT Goals PT Goal Formulation: With patient Time For Goal Achievement: 03/20/12 Potential to Achieve Goals: Good Pt will go Supine/Side to Sit: with supervision PT Goal: Supine/Side to Sit - Progress: Goal set today Pt will go Sit to Stand: with supervision PT Goal: Sit to Stand - Progress: Goal set today Pt will go Stand to Sit: with supervision PT Goal: Stand to Sit - Progress: Goal set today Pt will Ambulate: >150 feet;with supervision;with least restrictive assistive device PT Goal: Ambulate - Progress: Goal set today  Visit Information  Last PT Received On: 03/06/12 Assistance Needed: +1    Subjective Data  Subjective: "I like that name, [name]."   Prior Functioning  Home Living Lives With: Spouse Available Help at Discharge: Family Type of Home: House Prior Function Level of Independence: Independent Comments: independent prior to humeral fx a few weeks ago Communication Communication: No difficulties    Cognition  Overall Cognitive Status: History of cognitive impairments - at baseline Arousal/Alertness: Awake/alert Orientation Level: Appears intact for tasks assessed Behavior During Session: [facility] for tasks performed    Extremity/Trunk Assessment Right Upper Extremity Assessment RUE ROM/Strength/Tone: [facility] for tasks assessed Left Upper Extremity Assessment LUE ROM/Strength/Tone: Unable to fully assess;Due to precautions;Due to pain Right Lower Extremity Assessment RLE ROM/Strength/Tone: [facility] for tasks assessed Left Lower Extremity Assessment LLE ROM/Strength/Tone: [facility] for tasks assessed   Balance Balance Balance Assessed: No (hx of falls however)  End of Session PT - End of  Session Equipment Utilized During Treatment: Other (comment) (L UE sling) Activity Tolerance: Patient tolerated treatment well  Patient left: in chair;with call bell/phone within reach;with family/visitor present   [place] Gayden,[name] 03/06/2012, 3:44 PM Pager: [phone]

## 2012-03-06 NOTE — Progress Notes (Signed)
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 Irine Seal in 2003 History stage II adenocarcinoma right colon [id number] s/p r hemicolectomy [name] 03/10/2005, ventral hernia repair, primarily [name] during her  right hemicolectomy SBO managed non-op in the past-01/09/2004 Report of progressive dementia per Dr. Antonieta Pert note GI bleed 09/30/2004-endoscopy per Dr [name] S/p colon adenoma resection by GI 07/2000 + L Colectomy- colostomy reversed 02/2001 with subsequent SBO 12//2002  Consultants:  GI telephone consulted 6/14  Procedures:  none  Antibiotics:  none   Subjective  Confused CF.  Cannot answer orienting questions and becomes frustrated    Objective    Interim History: Nursing reports uneventful day so far, no stool so far.    Subjective:   Objective: Filed Vitals:   03/06/12 0112 03/06/12 0212 03/06/12 0500 03/06/12 0630  BP: 111/65 123/70  129/73  Pulse: 77 79  78  Temp: 98.2 F (36.8 C) 98.4 F (36.9 C)  98.1 F (36.7 C)  TempSrc: Oral Oral  Oral  Resp: 21 18  20   Height:   5' 5"  (1.651 m)   Weight:   100.699 kg (222 lb)   SpO2:    94%    Intake/Output Summary (Last 24 hours) at 03/06/12 1232 Last data filed at 03/06/12 0100  Gross  per 24 hour  Intake      0 ml  Output    425 ml  Net   -425 ml    Exam:  General: Alert, not oriented at all Cardiovascular: s1 s2 no m/r/g.  No added sound Respiratory: clinically clear Abdomen: soft-ileostomy notes in the Abdomen.  No rebound or gaurding Skin:soft.  No LE swelling.  No rash Neuro: Movemwent grossly intact.  Sevrely demented  Data Reviewed: Basic Metabolic Panel:  Lab 51/02/58 0553 03/05/12 1819 02/29/12 1157  NA 138 136 138  K 4.7 3.8 --  CL 106 101 106  CO2 24 29 24   GLUCOSE 101* 125* 155*  BUN 11 11 21   CREATININE 0.64 0.65 0.65  CALCIUM 9.3 9.2 9.2  MG -- -- 1.7  PHOS -- -- --   Liver Function Tests: No results found for this basename: AST:5,ALT:5,ALKPHOS:5,BILITOT:5,PROT:5,ALBUMIN:5 in the last 168 hours No results found for this basename: LIPASE:5,AMYLASE:5 in the last 168 hours No results found for this basename: AMMONIA:5 in the last 168 hours CBC:  Lab 03/06/12 0950 03/05/12 1819 02/29/12 0616 02/28/12 1803  WBC 3.0* 2.9* 8.8 7.7  NEUTROABS -- 1.6* -- --  HGB 7.5* 7.1* 8.5* 8.2*  HCT 23.2* 22.5* 26.5* 25.4*  MCV 88.9 89.6 90.1 92.0  PLT 94* 100* 88* 123*   Cardiac Enzymes: No results found for this basename: CKTOTAL:5,CKMB:5,CKMBINDEX:5,TROPONINI:5 in the last 168 hours BNP: No components found with this basename: POCBNP:5 CBG:  Lab 03/06/12 0755  GLUCAP 105*    Recent Results (from the past 240 hour(s))  MRSA PCR SCREENING  Status: Normal   Collection Time   02/27/12  1:05 AM      Component Value Range Status Comment   MRSA by PCR NEGATIVE  NEGATIVE Final      Studies:              All Imaging reviewed and is as per above notation   Scheduled Meds:   . sodium chloride   Intravenous STAT  . citalopram  20 mg Oral Daily  . docusate sodium  100 mg Oral BID  . fentaNYL  12.5 mcg Intravenous Once  . fentaNYL  50 mcg Intravenous Once  . pantoprazole (PROTONIX) IV  40 mg Intravenous Once  . pantoprazole (PROTONIX) IV  40  mg Intravenous Q12H  . ramipril  5 mg Oral Daily  . senna  1 tablet Oral BID   Continuous Infusions:   . sodium chloride 100 mL/hr at 03/06/12 1828     Assessment/Plan: 1. 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  Rpt CBC now and q 12 hourly.  If below 7.0, would transfuse 2U PRBC and likely consult GI regarding further Rec's if persists.  Continue IV PPI therapy and clear liquids for now.  IV rate dropped to 75 cc/hour and monitor pressures. 2. NASH with possible Variceal bleed- not on BB last admit given she was slightly hypotensive.  Will implement low dose Metoprolol 12.5 mg bid as is slightly hypertensive now and Pulse may tolerate the same 3. 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 Theodosia Blender request a courtesy consult from his standpoint if needed, given she does remember him as one of her physicians  4. Multiple Onc issues-Colon AdenoCa+Trasntional Cell Ca-currently quiescent per MEd ONc office notes 5. Anemia-Multifactorial likely 2/2 to ABLa and also 2/2 to chronic liver disease.  See #1-Iron studies show a low iron likely 2/2 to Anemia of Iron deficiency.  See #1 6. Humeral #-See's Murphy-Wainer for the # a couple weeks ago.  Non-operative candidate-Scheduled Tramadol q 4 hr + Ice packs  Code Status: Full code Family Communication: Spoke in detail with Daughter -in-law, Sister of patient and [name] at [phone].  Explained POC would be to repeat blood tests, See result of rpt Hb and likely transfuse.  Explained GI involvement would depend on persistent drop in Hb.  Will update Erica sr. (HCPOA) at [phone] with further information Disposition Plan: in-patient   Verneita Griffes, MD  [facility] Pager [phone] 03/06/2012, 12:32 PM    LOS: 1 day

## 2012-03-07 DIAGNOSIS — R112 Nausea with vomiting, unspecified: Secondary | ICD-10-CM

## 2012-03-07 DIAGNOSIS — D72819 Decreased white blood cell count, unspecified: Secondary | ICD-10-CM

## 2012-03-07 DIAGNOSIS — K7460 Unspecified cirrhosis of liver: Secondary | ICD-10-CM

## 2012-03-07 DIAGNOSIS — K922 Gastrointestinal hemorrhage, unspecified: Secondary | ICD-10-CM

## 2012-03-07 LAB — BASIC METABOLIC PANEL
BUN: 8 mg/dL (ref 6–23)
CO2: 27 mEq/L (ref 19–32)
Chloride: 102 mEq/L (ref 96–112)
Creatinine, Ser: 0.59 mg/dL (ref 0.50–1.10)
GFR calc Af Amer: >90 mL/min (ref >90)
Potassium: 3.6 mEq/L (ref 3.5–5.1)
Sodium: 137 mEq/L (ref 135–145)

## 2012-03-07 LAB — CBC
HCT: 25.9 % — ABNORMAL LOW (ref 36.0–46.0)
Hemoglobin: 8.5 g/dL — ABNORMAL LOW (ref 12.0–15.0)
MCHC: 31.4 g/dL (ref 30.0–36.0)
MCV: 88.1 fL (ref 78.0–100.0)
MCV: 88.6 fL (ref 78.0–100.0)
Platelets: 104 10*3/uL — ABNORMAL LOW (ref 150–400)
Platelets: 103 10*3/uL — ABNORMAL LOW (ref 150–400)
RBC: 2.94 MIL/uL — ABNORMAL LOW (ref 3.87–5.11)
RBC: 2.89 MIL/uL — ABNORMAL LOW (ref 3.87–5.11)
RBC: 3.07 MIL/uL — ABNORMAL LOW (ref 3.87–5.11)
WBC: 4.3 10*3/uL (ref 4.0–10.5)
WBC: 3.1 10*3/uL — ABNORMAL LOW (ref 4.0–10.5)

## 2012-03-07 LAB — GLUCOSE, CAPILLARY: Glucose-Capillary: 85 mg/dL (ref 70–99)

## 2012-03-07 LAB — TYPE AND SCREEN: ABO/RH(D): A POS

## 2012-03-07 NOTE — Consult Note (Signed)
[name] CONSULT Reason for consult: Drop in hemoglobin Referring Physician: [facility]   Primary Care: Dr. Modena Robertson     Primary GI: Dr. Pilar Robertson is an 71 y.o. female.  HPI: [name]. 71 year old woman who was readmitted to the hospital. She was admitted to the hospital earlier this month after having melena following a fall getting on to a cruise ship result in in a fracture of the left humerus which is being treated conservatively. She had a hemoglobin of 6 and was given 2 units of blood with a hemoglobin of 8.5. EGD done by Dr. Amedeo Robertson revealed proximal gastric ulcers, gastric irritation in duodenitis. 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 She was sent home on PPI therapy with followup with Dr. Cristina Robertson in 6 weeks. She went back to her primary care physician and her hemoglobin had dropped from 8.5-7.5. There was a question of some dark bowel movement after discharge but no significant bleeding. She was transfused again and were asked to see her to see if EGD or colonoscopy as needed. The patient has a history of descending colon cancer and has undergone a right hemicolectomy with primary anastomosis and also has a cystectomy due to bladder cancer with a permanent ureterostomy. 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 She had an ileal conduit no other significant findings. Her colon was grossly normal. Patient is followed by Dr. Marin Robertson for her bladder cancer in her colon cancer. Her right hemicolectomy was in 2006 and she says that she has had colonoscopy since then. We do not have any records currently available of her recent colonoscopies if any. Asked to see her because her hemoglobin had drifted back down. She denies taking any NSAIDs, hematemesis, hematochezia, or melena  since her last visit. It's notable that the patient has a markedly low iron level of 14 with saturation 5%. I do not see a stool guaiac this time.  Past Medical History  Diagnosis Date  . Anemia, iron deficiency 08/26/2011  . Cirrhosis 08/26/2011  . Colon cancer 2001  . Bladder cancer 2003  . Hypertension   . Memory loss     d/t chemotherapy   . Depression   . Thrombocytopenia   . Osteoporosis   . Blood transfusion   . Arthritis   . Complication of anesthesia     confusion x 2 to 3 days    Past Surgical History  Procedure Date  . Colon surgery 2004  . Total knee arthroplasty 2010    Left knee  . Colon surgery 2001  . Colostomy   . Bladder surgery   . Esophagogastroduodenoscopy 02/27/2012    Procedure: ESOPHAGOGASTRODUODENOSCOPY (EGD);  Surgeon: Erica Sabins, MD;  Location: [name];  Service: Endoscopy;  Laterality: N/A;  bedside case    Family History  Problem Relation Age of Onset  . Diabetes Brother   . Cancer Other   . Hypertension Other     Social History:  reports that she quit smoking about 20 years ago. She has never used smokeless tobacco. She reports that she does not drink alcohol or use illicit drugs.  Allergies:  Allergies  Allergen Reactions  . Codeine Swelling and Rash    Swelling in throat  . Morphine And Related Swelling and Rash  . Nutritional Supplements Anaphylaxis  .  Promethazine Hcl     Per MD chart - 08/08/11    Medications;    . sodium chloride   Intravenous STAT  . citalopram  20 mg Oral Daily  . docusate sodium  100 mg Oral BID  . metoprolol tartrate  12.5 mg Oral BID  . pantoprazole (PROTONIX) IV  40 mg Intravenous Q12H  . senna  1 tablet Oral BID  . traMADol  50-100 mg Oral Q4H  . DISCONTD: diphenhydrAMINE  25 mg Oral Once  . DISCONTD: furosemide  20 mg Intravenous Once  . DISCONTD: furosemide  20 mg Intravenous Once  . DISCONTD: metoprolol tartrate  12.5 mg Oral BID  . DISCONTD: ramipril  5 mg Oral Daily   PRN  Meds acetaminophen, acetaminophen, ondansetron (ZOFRAN) IV, ondansetron Results for orders placed during the hospital encounter of 03/05/12 (from the past 48 hour(s))  OCCULT BLOOD, POC DEVICE     Status: Normal   Collection Time   03/05/12  5:30 PM      Component Value Range Comment   Fecal Occult Bld POSITIVE     CBC     Status: Abnormal   Collection Time   03/05/12  6:19 PM      Component Value Range Comment   WBC 2.9 (*) 4.0 - 10.5 K/uL    RBC 2.51 (*) 3.87 - 5.11 MIL/uL    Hemoglobin 7.1 (*) 12.0 - 15.0 g/dL    HCT 22.5 (*) 36.0 - 46.0 %    MCV 89.6  78.0 - 100.0 fL    MCH 28.3  26.0 - 34.0 pg    MCHC 31.6  30.0 - 36.0 g/dL    RDW 16.3 (*) 11.5 - 15.5 %    Platelets 100 (*) 150 - 400 K/uL   DIFFERENTIAL     Status: Abnormal   Collection Time   03/05/12  6:19 PM      Component Value Range Comment   Neutrophils Relative 57  43 - 77 %    Lymphocytes Relative 24  12 - 46 %    Monocytes Relative 17 (*) 3 - 12 %    Eosinophils Relative 2  0 - 5 %    Basophils Relative 0  0 - 1 %    Neutro Abs 1.6 (*) 1.7 - 7.7 K/uL    Lymphs Abs 0.7  0.7 - 4.0 K/uL    Monocytes Absolute 0.5  0.1 - 1.0 K/uL    Eosinophils Absolute 0.1  0.0 - 0.7 K/uL    Basophils Absolute 0.0  0.0 - 0.1 K/uL    Smear Review PLATELET COUNT CONFIRMED BY SMEAR     BASIC METABOLIC PANEL     Status: Abnormal   Collection Time   03/05/12  6:19 PM      Component Value Range Comment   Sodium 136  135 - 145 mEq/Robertson    Potassium 3.8  3.5 - 5.1 mEq/Robertson    Chloride 101  96 - 112 mEq/Robertson    CO2 29  19 - 32 mEq/Robertson    Glucose, Bld 125 (*) 70 - 99 mg/dL    BUN 11  6 - 23 mg/dL    Creatinine, Ser 0.65  0.50 - 1.10 mg/dL    Calcium 9.2  8.4 - 10.5 mg/dL    GFR calc non Af Amer 87 (*) >90 mL/min    GFR calc Af Amer >90  >90 mL/min   TYPE AND SCREEN     Status: Normal  Collection Time   03/05/12  6:19 PM      Component Value Range Comment   ABO/RH(D) A POS      Antibody Screen NEG      Sample Expiration 03/08/2012      Unit  Number [id number]      Blood Component Type RED CELLS,LR      Unit division 00      Status of Unit ISSUED,FINAL      Transfusion Status OK TO TRANSFUSE      Crossmatch Result Compatible     APTT     Status: Abnormal   Collection Time   03/05/12  6:19 PM      Component Value Range Comment   aPTT 40 (*) 24 - 37 seconds   PROTIME-INR     Status: Normal   Collection Time   03/05/12  6:19 PM      Component Value Range Comment   Prothrombin Time 15.0  11.6 - 15.2 seconds    INR 1.16  0.00 - 1.49   PREPARE RBC (CROSSMATCH)     Status: Normal   Collection Time   03/05/12  8:30 PM      Component Value Range Comment   Order Confirmation ORDER PROCESSED BY BLOOD BANK     VITAMIN B12     Status: Normal   Collection Time   03/05/12  9:29 PM      Component Value Range Comment   Vitamin B-12 833  211 - 911 pg/mL   FOLATE     Status: Normal   Collection Time   03/05/12  9:29 PM      Component Value Range Comment   Folate 11.5     IRON AND TIBC     Status: Abnormal   Collection Time   03/05/12  9:29 PM      Component Value Range Comment   Iron 14 (*) 42 - 135 ug/dL    TIBC 289  250 - 470 ug/dL    Saturation Ratios 5 (*) 20 - 55 %    UIBC 275  125 - 400 ug/dL   FERRITIN     Status: Normal   Collection Time   03/05/12  9:29 PM      Component Value Range Comment   Ferritin 24  10 - 291 ng/mL   RETICULOCYTES     Status: Abnormal   Collection Time   03/05/12  9:29 PM      Component Value Range Comment   Retic Ct Pct 3.4 (*) 0.4 - 3.1 %    RBC. 2.63 (*) 3.87 - 5.11 MIL/uL    Retic Count, Manual 89.4  19.0 - 186.0 K/uL   BASIC METABOLIC PANEL     Status: Abnormal   Collection Time   03/06/12  5:53 AM      Component Value Range Comment   Sodium 138  135 - 145 mEq/Robertson    Potassium 4.7  3.5 - 5.1 mEq/Robertson    Chloride 106  96 - 112 mEq/Robertson    CO2 24  19 - 32 mEq/Robertson    Glucose, Bld 101 (*) 70 - 99 mg/dL    BUN 11  6 - 23 mg/dL    Creatinine, Ser 0.64  0.50 - 1.10 mg/dL    Calcium 9.3  8.4 - 10.5  mg/dL    GFR calc non Af Amer 87 (*) >90 mL/min    GFR calc Af Amer >90  >90 mL/min   GLUCOSE, CAPILLARY  Status: Abnormal   Collection Time   03/06/12  7:55 AM      Component Value Range Comment   Glucose-Capillary 105 (*) 70 - 99 mg/dL    Comment 1 Notify RN     CBC     Status: Abnormal   Collection Time   03/06/12  9:50 AM      Component Value Range Comment   WBC 3.0 (*) 4.0 - 10.5 K/uL    RBC 2.61 (*) 3.87 - 5.11 MIL/uL    Hemoglobin 7.5 (*) 12.0 - 15.0 g/dL    HCT 23.2 (*) 36.0 - 46.0 %    MCV 88.9  78.0 - 100.0 fL    MCH 28.7  26.0 - 34.0 pg    MCHC 32.3  30.0 - 36.0 g/dL    RDW 16.3 (*) 11.5 - 15.5 %    Platelets 94 (*) 150 - 400 K/uL   CBC     Status: Abnormal   Collection Time   03/06/12  2:15 PM      Component Value Range Comment   WBC 3.9 (*) 4.0 - 10.5 K/uL    RBC 3.03 (*) 3.87 - 5.11 MIL/uL    Hemoglobin 8.7 (*) 12.0 - 15.0 g/dL    HCT 27.1 (*) 36.0 - 46.0 %    MCV 89.4  78.0 - 100.0 fL    MCH 28.7  26.0 - 34.0 pg    MCHC 32.1  30.0 - 36.0 g/dL    RDW 16.2 (*) 11.5 - 15.5 %    Platelets 108 (*) 150 - 400 K/uL   CBC     Status: Abnormal   Collection Time   03/07/12  1:37 AM      Component Value Range Comment   WBC 4.3  4.0 - 10.5 K/uL    RBC 2.94 (*) 3.87 - 5.11 MIL/uL    Hemoglobin 8.1 (*) 12.0 - 15.0 g/dL    HCT 25.9 (*) 36.0 - 46.0 %    MCV 88.1  78.0 - 100.0 fL    MCH 27.6  26.0 - 34.0 pg    MCHC 31.3  30.0 - 36.0 g/dL    RDW 16.0 (*) 11.5 - 15.5 %    Platelets 97 (*) 150 - 400 K/uL   BASIC METABOLIC PANEL     Status: Abnormal   Collection Time   03/07/12  1:37 AM      Component Value Range Comment   Sodium 137  135 - 145 mEq/Robertson    Potassium 3.6  3.5 - 5.1 mEq/Robertson DELTA CHECK NOTED   Chloride 102  96 - 112 mEq/Robertson    CO2 27  19 - 32 mEq/Robertson    Glucose, Bld 96  70 - 99 mg/dL    BUN 8  6 - 23 mg/dL    Creatinine, Ser 0.59  0.50 - 1.10 mg/dL    Calcium 8.5  8.4 - 10.5 mg/dL    GFR calc non Af Amer 89 (*) >90 mL/min    GFR calc Af Amer >90  >90 mL/min    GLUCOSE, CAPILLARY     Status: Normal   Collection Time   03/07/12  7:43 AM      Component Value Range Comment   Glucose-Capillary 85  70 - 99 mg/dL    Comment 1 Documented in Chart      Comment 2 Notify RN     CBC     Status: Abnormal   Collection Time   03/07/12  12:55 PM      Component Value Range Comment   WBC 3.1 (*) 4.0 - 10.5 K/uL    RBC 2.89 (*) 3.87 - 5.11 MIL/uL    Hemoglobin 8.1 (*) 12.0 - 15.0 g/dL    HCT 25.6 (*) 36.0 - 46.0 %    MCV 88.6  78.0 - 100.0 fL    MCH 28.0  26.0 - 34.0 pg    MCHC 31.6  30.0 - 36.0 g/dL    RDW 16.0 (*) 11.5 - 15.5 %    Platelets 104 (*) 150 - 400 K/uL CONSISTENT WITH PREVIOUS RESULT    No results found. ROS: Please see H&P            Blood pressure 130/72, pulse 73, temperature 98.1 F (36.7 C), temperature source Oral, resp. rate 20, height 5' 5"  (1.651 m), weight 100.699 kg (222 lb), SpO2 92.00%.  Physical exam:  Alert white female in no acute distress Eyes-sclerae are nonicteric Lungs-clear Heart-regular rate and rhythm without murmurs or gallops  Abdomen-urostomy bag present right lower quadrant obese cannot rule out slight ascites nontender with good bowel sounds  Assessment: 1. Anemia-no gross bleeding. The patient is profoundly iron deficient and [name] suspect that her gastric ulcers have been adequately treated and will require continue PPI therapy. [name] think she does not need another EGD now but probably needs IV iron. 2. Probable early cirrhosis secondary to NASH. She had slight elevation of bilirubin in her last LFTs Up to go ahead and repeat that and check INR as a baseline. 3. History of colon cancer status post right colectomy status of last colonoscopy unclear 4. Status post radical cystectomy for bladder cancer 5. Left humerus fracture  Plan: [name] would suggest going ahead and giving the patient a dose of IV iron and try to normalize her iron stores. Hopefully this will allow her to replenish her blood that she has  lost from her gastric ulcers. We'll try to check on the results of her last colonoscopy. We'll go ahead and check liver tests and INR. Don't feel she needs EGD at this time. Would advance diet.   [name],[name] 03/07/2012, 2:33 PM

## 2012-03-07 NOTE — Evaluation (Signed)
Occupational Therapy Evaluation Patient Details Name: [name] MRN: [id number] DOB: 08/13/1941 Today's Date: 03/07/2012 Time: 6812-7517 OT Time Calculation (min): 44 min:  Multiple interruptions  OT Assessment / Plan / Recommendation Clinical Impression  This 71 year old female was admitted for GI bleed.  She has a h/o of falls and fx LUE approximately 3 weeks ago.  Her husband has been assisting her at home.  He feels she may be a little weaker now, but he doesn't anticipate any difficulties.   Will follow in acute to work on balance, and activity tolerance to maximize safety for home.    OT Assessment  Patient needs continued OT Services    Follow Up Recommendations  No OT follow up    Barriers to Discharge      Equipment Recommendations  None recommended by OT (likely none by OT)    Recommendations for Other Services    Frequency  Min 2X/week    Precautions / Restrictions Precautions Precautions: Fall Restrictions LUE Weight Bearing: Non weight bearing Other Position/Activity Restrictions: sling to L UE (per husband could come off and hang straight to shower)   Pertinent Vitals/Pain Pain in LUE:  Not rated but decreased when positioned back in bed on pillow    ADL  Eating/Feeding:  (husband has been feeding pt) Where Assessed - Eating/Feeding: Bed level Grooming: Performed;Teeth care;Minimal assistance; mod cues to sustain attention Where Assessed - Grooming: Unsupported sitting Upper Body Bathing: Performed;Moderate assistance Where Assessed - Upper Body Bathing: Unsupported sitting Lower Body Bathing: Performed;Maximal assistance Where Assessed - Lower Body Bathing: Unsupported sit to stand Upper Body Dressing: Performed;Moderate assistance Where Assessed - Upper Body Dressing: Unsupported sitting Lower Body Dressing: Performed;Maximal assistance Where Assessed - Lower Body Dressing: Unsupported sit to stand Transfers/Ambulation Related to ADLs: sidestepped  up bed with min A ADL Comments: Pt didn't sleep well.  Thought she was bathed but husband reports not today. Fatiqued after ADL    OT Diagnosis: Generalized weakness  OT Problem List: Decreased strength;Decreased activity tolerance;Impaired balance (sitting and/or standing);Decreased cognition;Decreased knowledge of precautions;Pain OT Treatment Interventions: Self-care/ADL training;Therapeutic activities;Balance training;Patient/family education;Cognitive remediation/compensation   OT Goals Acute Rehab OT Goals OT Goal Formulation: With patient/family Time For Goal Achievement: 03/21/12 Potential to Achieve Goals: Good ADL Goals Pt Will Perform Grooming: with min assist;Unsupported;Standing at sink (occasional min A to reach and manipulate items) ADL Goal: Grooming - Progress: Goal set today Pt Will Transfer to Toilet: with min assist;3-in-1;Ambulation (min guard) ADL Goal: Toilet Transfer - Progress: Goal set today Pt Will Perform Toileting - Clothing Manipulation: with min assist (and hygiene) ADL Goal: Toileting - Clothing Manipulation - Progress: Goal set today  Visit Information  Last OT Received On: 03/07/12 Assistance Needed: +1    Subjective Data  Subjective: "This arm hurts" Patient Stated Goal: husband feels they managed at home better than in the hospital.  States she may be a little weaker now   Prior Edith Endave Lives With: Spouse Available Help at Discharge: Family Type of Home: House Bathroom Toilet: Standard (3:1) Prior Function Level of Independence: Needs assistance (since fall with LUE fx) Communication Communication: No difficulties Dominant Hand: Left    Cognition  Overall Cognitive Status: History of cognitive impairments - at baseline Arousal/Alertness: Awake/alert Orientation Level: Disoriented to;Situation;Time Behavior During Session: [facility] for tasks performed    Extremity/Trunk Assessment Right Upper Extremity Assessment RUE  ROM/Strength/Tone: [facility] for tasks assessed Left Upper Extremity Assessment LUE ROM/Strength/Tone: Due to precautions;Unable to fully assess:  Pt does move LUE within sling:  Cued not to keep still and let it rest in sling   Mobility Bed Mobility Bed Mobility: Supine to Sit Supine to Sit: 4: Min assist Transfers Sit to Stand: 4: Min assist;From bed;With upper extremity assist   Exercise    Balance Balance Balance Assessed: Yes Static Standing Balance Static Standing - Balance Support: No upper extremity supported Static Standing - Level of Assistance: 5: Stand by assistance  End of Session OT - End of Session Activity Tolerance: Patient limited by fatigue;Patient limited by pain Patient left: in bed;with call bell/phone within reach   [facility] 03/07/2012, 10:56 AM Lesle Chris, OTR/L [phone] 03/07/2012

## 2012-03-07 NOTE — Progress Notes (Signed)
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 Irine Seal in 2003 History stage II adenocarcinoma right colon [id number] s/p r hemicolectomy [name] 03/10/2005, ventral hernia repair, primarily [name] during her  right hemicolectomy SBO managed non-op in the past-01/09/2004 Report of progressive dementia per Dr. Antonieta Pert note GI bleed 09/30/2004-endoscopy per Dr [name] S/p colon adenoma resection by GI 07/2000 + L Colectomy- colostomy reversed 02/2001 with subsequent SBO 12//2002  Consultants:  [facility] telephone consulted 6/14  Formal consult called to Dr. Oletta Lamas 6/16-appreciate input in advance  Procedures:  none  Antibiotics:  none   Subjective  Calm and uneventful pm.  Husband at bedside.  States shoulder pains but it seems better.  No N/V/CP.   Patient appears suspicious of the going's on around her and requests Dr. [name] to be involved in her care    Objective    Interim History: Nursing reports uneventful day so far, no stool so far.    Subjective:   Objective: Filed Vitals:   03/06/12 0630 03/06/12 1333 03/06/12 2200 03/07/12 0600  BP: 129/73 164/76 143/77 130/72  Pulse: 78 92 78 73  Temp: 98.1 F (36.7 C) 98 F (36.7 C) 98 F (36.7 C)  98.1 F (36.7 C)  TempSrc: Oral Oral Oral Oral  Resp: 20 20 20 20   Height:      Weight:      SpO2: 94% 93% 94% 92%    Intake/Output Summary (Last 24 hours) at 03/07/12 0850 Last data filed at 03/07/12 0600  Gross per 24 hour  Intake 2023.45 ml  Output   1350 ml  Net 673.45 ml    Exam:  General: Alert, not oriented at all Cardiovascular: s1 s2 no m/r/g.  No added sound Respiratory: clinically clear Abdomen: soft-ileostomy notes in the Abdomen.  No rebound or gaurding Skin:soft.  No LE swelling.  No rash Neuro: Movemwent grossly intact.  Severly demented  Data Reviewed: Basic Metabolic Panel:  Lab 79/39/03 0137 03/06/12 0553 03/05/12 1819 02/29/12 1157  NA 137 138 136 138  K 3.6 4.7 -- --  CL 102 106 101 106  CO2 27 24 29 24   GLUCOSE 96 101* 125* 155*  BUN 8 11 11 21   CREATININE 0.59 0.64 0.65 0.65  CALCIUM 8.5 9.3 9.2 9.2  MG -- -- -- 1.7  PHOS -- -- -- --   Liver Function Tests: No results found for this basename: AST:5,ALT:5,ALKPHOS:5,BILITOT:5,PROT:5,ALBUMIN:5 in the last 168 hours No results found for this basename: LIPASE:5,AMYLASE:5 in the last 168 hours No results found for this basename: AMMONIA:5 in the last 168 hours CBC:  Lab 03/07/12 0137 03/06/12 1415 03/06/12 0950 03/05/12 1819  WBC 4.3 3.9* 3.0* 2.9*  NEUTROABS -- -- -- 1.6*  HGB 8.1* 8.7* 7.5* 7.1*  HCT 25.9* 27.1* 23.2* 22.5*  MCV 88.1 89.4 88.9 89.6  PLT 97* 108* 94* 100*   Cardiac Enzymes:  No results found for this basename: CKTOTAL:5,CKMB:5,CKMBINDEX:5,TROPONINI:5 in the last 168 hours BNP: No components found with this basename: POCBNP:5 CBG:  Lab 03/06/12 0755  GLUCAP 105*    Recent Results (from the past 240 hour(s))  MRSA PCR SCREENING     Status: Normal   Collection Time   02/27/12  1:05 AM      Component Value Range Status Comment   MRSA by PCR NEGATIVE  NEGATIVE Final      Studies:              All Imaging reviewed and is as per above notation   Scheduled Meds:     . sodium chloride   Intravenous STAT  . citalopram  20 mg Oral Daily  . docusate sodium  100 mg Oral BID  . metoprolol tartrate  12.5 mg Oral BID  . pantoprazole (PROTONIX) IV  40 mg Intravenous Q12H  . senna  1 tablet Oral BID  . traMADol  50-100 mg Oral Q4H  . DISCONTD: diphenhydrAMINE  25 mg Oral Once  . DISCONTD: furosemide  20 mg Intravenous Once  . DISCONTD: furosemide  20 mg Intravenous Once  . DISCONTD: furosemide  20 mg Intravenous Once  . DISCONTD: metoprolol tartrate  12.5 mg Oral BID  . DISCONTD: ramipril  5 mg Oral Daily   Continuous Infusions:    . DISCONTD: sodium chloride 100 mL/hr at 03/06/12 3009     Assessment/Plan: 1. 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 Spoke with Dr. Oletta Lamas 6/16 who agrees to evaluate for possible Upper Ednoscopy.  Rpt CBC now and q 12 hourly.  If below 7.0, would transfuse 2U PRBC.  Continue IV PPI therapy and clear liquids for now.  IV rate dropped to 75 cc/hour and monitor pressures. 2. NASH with possible Variceal bleed- not on BB last admit given she was slightly hypotensive.  Will implement low dose Metoprolol 12.5 mg bid as is slightly hypertensive now and Pulse may tolerate the same-would prevent variceal bleed as well (not visualized last time however)-Rpt CMET and INR for risk startification 3. 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 Theodosia Blender request a courtesy consult from his standpoint if needed, given she does remember him Clearly as one of her physicians-and verbalizes she would like him involved in her care 4. Multiple Onc issues-Colon AdenoCa+Transitional Cell Ca-currently quiescent per Med Onc office notes 5. Anemia-Multifactorial likely 2/2 to ABLa and also 2/2 to chronic liver disease.  See #1-Iron studies show a low iron likely 2/2 to Anemia of Iron deficiency.  See #1 6. Humeral #-See's Murphy-Wainer for the # a couple weeks ago.  Non-operative candidate-Scheduled Tramadol q 4  hr + Ice packs  Code Status: Full code Family Communication: Spoke in detail with Husband personally with husband Yvone Neu in [place] POC would be to repeat blood tests, See result of rpt Hb and likely transfuse.  Explained GI involvement anticipated.  Will update Ken sr. (HCPOA) at [phone] with further information as needed Disposition Plan: in-patient, ? transfuse, ? Endoscopy   Verneita Griffes, MD  [facility] Pager [phone] 03/07/2012, 8:50 AM    LOS: 2 days

## 2012-03-08 DIAGNOSIS — K7460 Unspecified cirrhosis of liver: Secondary | ICD-10-CM

## 2012-03-08 DIAGNOSIS — R112 Nausea with vomiting, unspecified: Secondary | ICD-10-CM

## 2012-03-08 DIAGNOSIS — K922 Gastrointestinal hemorrhage, unspecified: Secondary | ICD-10-CM

## 2012-03-08 DIAGNOSIS — D72819 Decreased white blood cell count, unspecified: Secondary | ICD-10-CM

## 2012-03-08 LAB — PROTIME-INR: INR: 1.16 (ref 0.00–1.49)

## 2012-03-08 LAB — COMPREHENSIVE METABOLIC PANEL
ALT: 11 U/L (ref 0–35)
AST: 16 U/L (ref 0–37)
Albumin: 2.5 g/dL — ABNORMAL LOW (ref 3.5–5.2)
CO2: 27 mEq/L (ref 19–32)
Calcium: 8.9 mg/dL (ref 8.4–10.5)
Sodium: 138 mEq/L (ref 135–145)
Total Protein: 5.2 g/dL — ABNORMAL LOW (ref 6.0–8.3)

## 2012-03-08 LAB — CBC
HCT: 27.3 % — ABNORMAL LOW (ref 36.0–46.0)
Hemoglobin: 8.6 g/dL — ABNORMAL LOW (ref 12.0–15.0)
MCV: 88.1 fL (ref 78.0–100.0)
RBC: 3.1 MIL/uL — ABNORMAL LOW (ref 3.87–5.11)
WBC: 4.9 10*3/uL (ref 4.0–10.5)

## 2012-03-08 MED ORDER — FERUMOXYTOL INJECTION 510 MG/17 ML
510.0000 mg | Freq: Once | INTRAVENOUS | Status: AC
  Administered 2012-03-08: 510 mg via INTRAVENOUS
  Filled 2012-03-08: qty 17

## 2012-03-08 MED ORDER — METOPROLOL TARTRATE 12.5 MG HALF TABLET: 12.5000 mg | ORAL_TABLET | Freq: Two times a day (BID) | ORAL | Status: DC

## 2012-03-08 MED ORDER — SENNOSIDES-DOCUSATE SODIUM 8.6-50 MG PO TABS
2.0000 | ORAL_TABLET | Freq: Every day | ORAL | Status: DC
  Filled 2012-03-08: qty 2

## 2012-03-08 MED ORDER — FERROUS SULFATE 325 (65 FE) MG PO TABS
325.0000 mg | ORAL_TABLET | Freq: Three times a day (TID) | ORAL | Status: DC
  Administered 2012-03-08: 325 mg via ORAL
  Filled 2012-03-08 (×3): qty 1

## 2012-03-08 MED ORDER — FERROUS SULFATE 325 (65 FE) MG PO TABS: 325.0000 mg | ORAL_TABLET | Freq: Three times a day (TID) | ORAL | Status: DC

## 2012-03-08 NOTE — Progress Notes (Signed)
Subjective: No complaints. No blood in stool.  Objective: Vital signs in last 24 hours: Temp:  [98.2 F (36.8 C)-98.6 F (37 C)] 98.2 F (36.8 C) (06/17 0600) Pulse Rate:  [60-63] 60  (06/17 0600) Resp:  [18] 18  (06/17 0600) BP: (109-125)/(68-80) 125/80 mmHg (06/17 0600) SpO2:  [91 %-92 %] 91 % (06/17 0600) Weight change:  Last BM Date: 03/05/12  PE: GEN:  NAD, chronically ill-appearing  Lab Results: CBC    Component Value Date/Time   WBC 3.8* 03/07/2012 2210   WBC 2.5* 12/23/2011 0943   WBC 3.5* 06/26/2008 1407   RBC 3.07* 03/07/2012 2210   RBC 4.09 06/26/2008 1407   HGB 8.5* 03/07/2012 2210   HGB 12.0 12/23/2011 0943   HGB 11.0* 06/26/2008 1407   HCT 27.1* 03/07/2012 2210   HCT 35.3 12/23/2011 0943   HCT 32.8* 06/26/2008 1407   PLT 103* 03/07/2012 2210   PLT 60* 12/23/2011 0943   PLT 93* 06/26/2008 1407   MCV 88.3 03/07/2012 2210   MCV 90 12/23/2011 0943   MCV 80.3* 06/26/2008 1407   MCH 27.7 03/07/2012 2210   MCH 30.5 12/23/2011 0943   MCH 26.9 06/26/2008 1407   MCHC 31.4 03/07/2012 2210   MCHC 34.0 12/23/2011 0943   MCHC 33.5 06/26/2008 1407   RDW 16.1* 03/07/2012 2210   RDW 16.0* 12/23/2011 0943   RDW 18.3* 06/26/2008 1407   LYMPHSABS 0.7 03/05/2012 1819   LYMPHSABS 0.6* 12/23/2011 0943   LYMPHSABS 0.9 06/26/2008 1407   MONOABS 0.5 03/05/2012 1819   MONOABS 0.4 06/26/2008 1407   EOSABS 0.1 03/05/2012 1819   EOSABS 0.1 12/23/2011 0943   EOSABS 0.1 06/26/2008 1407   BASOSABS 0.0 03/05/2012 1819   BASOSABS 0.0 12/23/2011 0943   BASOSABS 0.0 06/26/2008 1407   Iron/TIBC/Ferritin    Component Value Date/Time   IRON 14* 03/05/2012 2129   TIBC 289 03/05/2012 2129   FERRITIN 24 03/05/2012 2129    Assessment:  1.  Anemia.  Multifactorial; portal gastropathy, chronic disease, ulcer.  No overt bleeding at this time.  Plan:  1.  There is no evidence of ongoing bleeding, and doubt utility of repeat endoscopy at this time. 2.  Discussed with family; patient had colonoscopy 6-8 months ago, thus I  don't think there is any utility of repeat colonoscopy at this time. 3.  Needs intravenous iron to replete iron stores.  Would administer a dose as inpatient prior to discharge. 4.  Continue PPI treatment for gastric ulcer.  Avoid/minimize NSAID use as clinically feasible. 5.  Will follow.   Landry Dyke 03/08/2012, 10:46 AM

## 2012-03-08 NOTE — Progress Notes (Addendum)
Pharmacy: IV Iron  71 yo F with anemia secondary to blood loss from gastric ulcers to receive IV iron today.  Current Hgb 8.5.   Plan:  1.) Feraheme injection 510 mg IV x 1 dose today 2.) Ferrous sulfate 325 mg po TID with meals 3) continue bowel regimen    Please re-consult pharmacy for any further dosing needs. Thanks!  Glee Arvin PharmD 10:09 AM 03/08/2012

## 2012-03-08 NOTE — Progress Notes (Signed)
CARE MANAGEMENT NOTE 03/08/2012  Patient:  [name], [name]   Account Number:  [id number]  Date Initiated:  03/08/2012  Documentation initiated by:  Emmerson Shuffield  Subjective/Objective Assessment:   patient with readmission for blood in stool, hgb 7.1 and requiring bld transfusion     Action/Plan:   lives at home   Anticipated DC Date:  03/11/2012   Anticipated DC Plan:  [place]  In-house referral  NA      [place]  CM consult      [facility] Choice  NA   Choice offered to / List presented to:  C-1 Patient   DME arranged  NA      DME agency  NA     [place] arranged  NA      [place] agency  NA   Status of service:  In process, will continue to follow Medicare Important Message given?   (If response is "NO", the following Medicare IM given date fields will be blank) Date Medicare IM given:   Date Additional Medicare IM given:    Discharge Disposition:    Per UR Regulation:  Reviewed for med. necessity/level of care/duration of stay  If discussed at [place] of Stay Meetings, dates discussed:    Comments:  [id number] Velva Harman, RN, BSN, CCM No discharge needs present at time of this review Case Management [phone]

## 2012-03-08 NOTE — Progress Notes (Signed)
DC instructions reviewed with patient and spouse.  Patient and spouse deny further questions or concerns at time.  Patient verbalized understanding of needing to call the 4 doctor's listed on her AVS to schedule follow-up appointments.  Home med rec was carefully reviewed and 2 Rxs given to the spouse.  Patient reported that her husband would help her get dressed.  IV DC.  Patient to be DC to home with spouse.

## 2012-03-08 NOTE — Discharge Summary (Signed)
[facility] Discharge Summary  Date of Admission: 03/05/2012  4:35 PM Admitter: @ADMITPROV @   Date of Discharge6/17/2013 Attending Physician: Nita Sells, MD  Things to Follow-up on: Needs CBC and basic metabolic panel in 2-3 weeks  Watch for large dark blood in stool prior to decision to come to ED-She might have had dark stool from oral Iron    [name] ZSM:[id number] DOB: [birth year] DOA: 03/05/2012 PCP: Florina Ou, MD  Brief narrative: 71 year old emale readmitted after discharge 6/6 with recurrent likely upper GI bleed-patient had been discharged home with likely urosepsis gastritis status post EGD done 6/7 showing proximal ulcers with mild poor stroke gastropathy in the proximal and mid bulbar duodenitis with moderate dysphagia. They recommended outpatient PPI beta blocker prophylaxis on beta blocker was not initiated on last hospitalization secondary to bradycardia. She does received iron infusions by Dr. [name]. Using the PCPs office who stated the hemoglobin dropped to 7.5. She is very frustrated and tearful initially and did not really have any new issues but husband states he noted a small amount of dark stool, whether this is bloody and not is in question. In the ED vitals were stable. Chem normal. WBC acutely low at 2.9 (?),  Hgb down to 7.2 from 8.5 on 6/8, plts 100. INR 1.1. Rectal exam showed  red blood and was fecal occult positive. 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 York Pellant received IV iron in December Remote history stage IV -PT2B, PN1, PMX transitional cell carcinoma into the outer  one-half of the muscular propria, status post total cystectomy with   Ileoconduit with Dr. Irine Seal in 2003 History stage II adenocarcinoma right colon [id number] s/p r hemicolectomy [name] 03/10/2005, ventral hernia repair, primarily [name] during her   right hemicolectomy SBO managed non-op in the past-01/09/2004 Report of progressive dementia per Dr. Antonieta Pert note GI bleed 09/30/2004-endoscopy per Dr [name] S/p colon adenoma resection by GI 07/2000 + L Colectomy- colostomy reversed 02/2001 with subsequent SBO 12//2002  Consultants:  [facility] telephone consulted 6/14   Formal consult called to Dr. Oletta Lamas 6/16-appreciate input in advance  Procedures:  none  Antibiotics:  none   Hospital Course by problem list: Assessment/Plan: 1. 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 Initially gastroenterologist telephone consult however given persistence of relatively low hemoglobin, gastroenterology was consulted who initially thought patient may benefit from possible rescoping however on further history patient has not had any further dark stools during hospitalization they recommended dose IV iron and oral iron therapy which was started on day of discharge 6/17 I-patient was kept on IV fluids and IV PPI until transition to oral PPI and should continue the same on discharge. I've recommended to family in the room that patient should benefit from discussion with the gastroenterologist as well as primary care physician prior to further hospitalizations if not an overt acute large bleed. She follows with either Dr. [name] her gastroenterologist IV iron infusions which would be every one to 2 weeks. 2. NASH with possible Variceal bleed- not on BB last admit given she was slightly hypotensive.  Will implement low dose Metoprolol 12.5 mg bid as is slightly hypertensive now and Pulse may tolerate  the same-would prevent variceal bleed as well (not visualized last time however)-patient  did have seen that which did not show any progression in terms of LFTs and INR was 1.16 on day of discharge 3. 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 Theodosia Blender request a courtesy consult from his standpoint if needed, given she does remember him Clearly as one of her physicians-and verbalizes she would like him involved in her care-I did contact Dr. [name] during hospitalization but the patient was discharged home from a hematology standpoint and it was felt patient could see him as an outpatient I will forward a note to him  4. Multiple Onc issues-Colon AdenoCa+Transitional Cell Ca-currently quiescent per Med Onc office notes-see above 5. Anemia-Multifactorial likely 2/2 to ABLa and also 2/2 to chronic liver disease.  See #1-Iron studies show a low iron likely 2/2 to Anemia of Iron deficiency.  See #1  6. Humeral #-See's Murphy-Wainer for the # a couple weeks ago.  Non-operative candidate-Scheduled Tramadol q 4 hr + Ice packs   LOS: 3 days     Procedures Performed and pertinent labs: No results found.  Discharge Vitals & PE:  BP 125/80  Pulse 60  Temp 98.2 F (36.8 C) (Oral)  Resp 18  Ht 5' 5"  (1.651 m)  Wt 102.8 kg (226 lb 10.1 oz)  BMI 37.71 kg/m2  SpO2 91% General: Alert, not oriented at all Cardiovascular: s1 s2 no m/r/g.  No added sound Respiratory: clinically clear Abdomen: soft-ileostomy notes in the Abdomen.  No rebound or gaurding Skin:soft.  No LE swelling.  No rash Neuro: Movemwent grossly intact.  Severly demented but seems much more calm today    Discharge Labs:  Results for orders placed during the hospital encounter of 03/05/12 (from the past 24 hour(s))  CBC     Status: Abnormal   Collection Time   03/07/12 12:55 PM      Component Value Range   WBC 3.1 (*) 4.0 - 10.5 K/uL   RBC 2.89 (*) 3.87 - 5.11 MIL/uL   Hemoglobin 8.1 (*) 12.0 - 15.0 g/dL   HCT 25.6 (*) 36.0 - 46.0 %   MCV 88.6  78.0 - 100.0 fL   MCH 28.0  26.0 - 34.0 pg   MCHC  31.6  30.0 - 36.0 g/dL   RDW 16.0 (*) 11.5 - 15.5 %   Platelets 104 (*) 150 - 400 K/uL  CBC     Status: Abnormal   Collection Time   03/07/12 10:10 PM      Component Value Range   WBC 3.8 (*) 4.0 - 10.5 K/uL   RBC 3.07 (*) 3.87 - 5.11 MIL/uL   Hemoglobin 8.5 (*) 12.0 - 15.0 g/dL   HCT 27.1 (*) 36.0 - 46.0 %   MCV 88.3  78.0 - 100.0 fL   MCH 27.7  26.0 - 34.0 pg   MCHC 31.4  30.0 - 36.0 g/dL   RDW 16.1 (*) 11.5 - 15.5 %   Platelets 103 (*) 150 - 400 K/uL  COMPREHENSIVE METABOLIC PANEL     Status: Abnormal   Collection Time   03/08/12  5:26 AM      Component Value Range   Sodium 138  135 - 145 mEq/L   Potassium 3.8  3.5 - 5.1 mEq/L   Chloride 104  96 - 112 mEq/L   CO2 27  19 - 32 mEq/L   Glucose, Bld 97  70 - 99 mg/dL   BUN 7  6 -  23 mg/dL   Creatinine, Ser 0.59  0.50 - 1.10 mg/dL   Calcium 8.9  8.4 - 10.5 mg/dL   Total Protein 5.2 (*) 6.0 - 8.3 g/dL   Albumin 2.5 (*) 3.5 - 5.2 g/dL   AST 16  0 - 37 U/L   ALT 11  0 - 35 U/L   Alkaline Phosphatase 105  39 - 117 U/L   Total Bilirubin 0.8  0.3 - 1.2 mg/dL   GFR calc non Af Amer 89 (*) >90 mL/min   GFR calc Af Amer >90  >90 mL/min  PROTIME-INR     Status: Normal   Collection Time   03/08/12  5:26 AM      Component Value Range   Prothrombin Time 15.0  11.6 - 15.2 seconds   INR 1.16  0.00 - 1.49  GLUCOSE, CAPILLARY     Status: Abnormal   Collection Time   03/08/12  7:50 AM      Component Value Range   Glucose-Capillary 115 (*) 70 - 99 mg/dL  CBC     Status: Abnormal   Collection Time   03/08/12 10:20 AM      Component Value Range   WBC 4.9  4.0 - 10.5 K/uL   RBC 3.10 (*) 3.87 - 5.11 MIL/uL   Hemoglobin 8.6 (*) 12.0 - 15.0 g/dL   HCT 27.3 (*) 36.0 - 46.0 %   MCV 88.1  78.0 - 100.0 fL   MCH 27.7  26.0 - 34.0 pg   MCHC 31.5  30.0 - 36.0 g/dL   RDW 16.2 (*) 11.5 - 15.5 %   Platelets 127 (*) 150 - 400 K/uL    Disposition and follow-up:   Ms.Daira J Ospina was discharged from in good  condition.    Follow-up  Appointments:  Follow-up Information    Follow up with Florina Ou, MD in 3 days.   Contact information:   [address] [place] [phone]       Follow up with Cleotis Nipper, MD. Schedule an appointment as soon as possible for a visit in 1 week.   Contact information:   [address], [place], [place]. [place] [phone]       Schedule an appointment as soon as possible for a visit with Lorn Junes, MD. (as needed)    Contact information:   [facility] [address] [place] [phone]       Call [name], MD.   Contact information:   [address] [place] [phone]          Discharge Medications: Medication List  As of 03/08/2012 12:34 PM   TAKE these medications         CALCIUM + D PO   Take by mouth daily.      Cinnamon 500 MG capsule   Take 500 mg by mouth 2 (two) times daily.      citalopram 40 MG tablet   Commonly known as: CELEXA   Take 20 mg by mouth Daily. PHARMACIST RECENTLY TOLD THEM TO TAKE 1/2 DOSE (20 MG) INSTEAD OF 40 MG SO HAS BEEN TAKING 20 THIS WEEK      ferrous sulfate 325 (65 FE) MG tablet   Take 1 tablet (325 mg total) by mouth 3 (three) times daily with meals.  fish oil-omega-3 fatty acids 1000 MG capsule   Take 2 g by mouth daily.      metoprolol tartrate 12.5 mg Tabs   Commonly known as: LOPRESSOR   Take 0.5 tablets (12.5 mg total) by mouth 2 (two) times daily.      pantoprazole 40 MG tablet   Commonly known as: PROTONIX   Take 1 tablet (40 mg total) by mouth 2 (two) times daily.      ramipril 5 MG capsule   Commonly known as: ALTACE   Take 5 mg by mouth Daily.      traMADol 50 MG tablet   Commonly known as: ULTRAM   Take 50-100 mg by mouth See admin instructions. Take 1 to 2 tablets  every 4 to 6 hours as needed for pain. Do not exceed 8 tablets per day.      Vitamin D3 1000 UNITS Caps   Take 1 capsule by mouth daily.           Medications Discontinued During This Encounter  Medication Reason  . traMADol (ULTRAM) 50 MG tablet   . ondansetron (ZOFRAN) injection 4 mg   . traMADol (ULTRAM) tablet 50-100 mg   . furosemide (LASIX) injection 20 mg   . 0.9 %  sodium chloride infusion   . ramipril (ALTACE) capsule 5 mg   . metoprolol tartrate (LOPRESSOR) tablet 12.5 mg   . diphenhydrAMINE (BENADRYL) capsule 25 mg   . furosemide (LASIX) injection 20 mg   . furosemide (LASIX) injection 20 mg   . senna-docusate (Senokot-S) tablet 2 tablet Duplicate    > 40 minutes time spent preparing d/c summary, including direct face-face patient Time, contact with consultants, family and care coordination   SignedNita Sells 03/08/2012, 12:34 PM

## 2012-03-09 ENCOUNTER — Other Ambulatory Visit: Payer: Medicare Other | Admitting: Lab

## 2012-03-09 ENCOUNTER — Telehealth: Payer: Self-pay | Admitting: Hematology & Oncology

## 2012-03-09 ENCOUNTER — Ambulatory Visit: Payer: Medicare Other

## 2012-03-09 ENCOUNTER — Ambulatory Visit (HOSPITAL_BASED_OUTPATIENT_CLINIC_OR_DEPARTMENT_OTHER): Payer: Medicare Other | Admitting: Hematology & Oncology

## 2012-03-09 ENCOUNTER — Ambulatory Visit: Payer: Medicare Other | Admitting: Hematology & Oncology

## 2012-03-09 ENCOUNTER — Other Ambulatory Visit: Payer: Self-pay | Admitting: Hematology & Oncology

## 2012-03-09 VITALS — BP 83/47 | HR 68 | Temp 97.0°F | Ht 65.0 in | Wt 230.0 lb

## 2012-03-09 DIAGNOSIS — D72819 Decreased white blood cell count, unspecified: Secondary | ICD-10-CM

## 2012-03-09 DIAGNOSIS — K922 Gastrointestinal hemorrhage, unspecified: Secondary | ICD-10-CM

## 2012-03-09 DIAGNOSIS — D500 Iron deficiency anemia secondary to blood loss (chronic): Secondary | ICD-10-CM

## 2012-03-09 DIAGNOSIS — D509 Iron deficiency anemia, unspecified: Secondary | ICD-10-CM

## 2012-03-09 DIAGNOSIS — K7689 Other specified diseases of liver: Secondary | ICD-10-CM

## 2012-03-09 DIAGNOSIS — D696 Thrombocytopenia, unspecified: Secondary | ICD-10-CM

## 2012-03-09 LAB — CBC WITH DIFFERENTIAL (CANCER CENTER ONLY)
BASO#: 0 10*3/uL (ref 0.0–0.2)
Eosinophils Absolute: 0.1 10*3/uL (ref 0.0–0.5)
HGB: 8.8 g/dL — ABNORMAL LOW (ref 11.6–15.9)
LYMPH%: 15.8 % (ref 14.0–48.0)
MCH: 28.5 pg (ref 26.0–34.0)
MCV: 90 fL (ref 81–101)
MONO#: 0.7 10*3/uL (ref 0.1–0.9)
MONO%: 15.8 % — ABNORMAL HIGH (ref 0.0–13.0)
RBC: 3.09 10*6/uL — ABNORMAL LOW (ref 3.70–5.32)
WBC: 4.2 10*3/uL (ref 3.9–10.0)

## 2012-03-09 NOTE — Progress Notes (Signed)
This office note has been dictated.

## 2012-03-09 NOTE — Progress Notes (Signed)
CC:   [name], M.D. [name], M.D.  DIAGNOSES: 1. Iron-deficiency anemia secondary to gastrointestinal bleed. 2. Progressive nonalcoholic steatohepatitis with varices. 3. Leukopenia/thrombocytopenia secondary to nonalcoholic     steatohepatitis. 4. 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  CURRENT THERAPY:  The patient receives IV iron as indicated.  INTERIM HISTORY:  [name] comes in for a visit.  She was just discharged from [facility] yesterday.  She had GI bleeding. She has varices.  She had an upper endoscopy which did show, I think, some small gastric ulcerations.  She was on a cruise about a month or so ago.  Before the cruise ship left port, she fell and broke her right humerus/shoulder.  She is in a sling.  She sees Dr. Percell Miller for this. While she was down in [place] getting this shoulder stabilized, she was getting a lot of Advil.  She has dementia, which is really getting bad.  She does not even remember being in the hospital.  She did get a dose of iron in the hospital, Feraheme at 510 mg.  She is eating okay.  There is no nausea and vomiting.  She has had no cough.  She has had some leg swelling.  When she was hospitalized, her iron studies showed a ferritin of 24. Iron saturation was only 5.  PHYSICAL EXAMINATION:  This is a chronically ill-appearing white female in no obvious distress.  Vital signs:  97.6, pulse 68, respiratory rate 18, blood pressure 83/47.  Weight is 230.  Head and neck:  A normocephalic, atraumatic skull.  There are no ocular or oral lesions. There are no palpable cervical or supraclavicular lymph nodes.  Lungs: Clear bilaterally.  Cardiac:  Regular rate and rhythm with a normal S1, S2.  There are no murmurs, rubs or bruits.  Abdomen:  Soft.  She has a urostomy in the right lower quadrant.  She has laparotomy scars that  are well-healed.  There is no guarding or rebound tenderness.  Extremities: The left arm is in a sling to immobilize it.  She does have some 1+ edema in her legs bilaterally.  Skin:  Numerous ecchymoses.  LABORATORY STUDIES:  White cell count is 4.2, hemoglobin 8.8, hematocrit 27.8, platelet count 114.  MCV is 90.  IMPRESSION:  [name] is a 71 year old white female with progressive dementia.  Has progressive cirrhosis.  She has varices.  She had a recent gastrointestinal bleed because of varices and gastric ulcerations.  We will go ahead and give her dose of iron next week.  I know that she will need more iron.  She was given iron to take by mouth on discharge.  This is not going to work for her.  She is on a PPI, and this will prevent iron from being absorbed.  I want see [name] back in 6 weeks.  By then, we should be getting her above 10 or 11.    ______________________________ [name], M.D. PRE/MEDQ  D:  03/09/2012  T:  03/09/2012  Job:  [id number]

## 2012-03-09 NOTE — Telephone Encounter (Signed)
Pt aware of 6-18 appointment

## 2012-03-18 ENCOUNTER — Ambulatory Visit (HOSPITAL_BASED_OUTPATIENT_CLINIC_OR_DEPARTMENT_OTHER): Payer: Medicare Other

## 2012-03-18 VITALS — BP 114/71 | HR 61 | Temp 97.4°F

## 2012-03-18 DIAGNOSIS — D509 Iron deficiency anemia, unspecified: Secondary | ICD-10-CM

## 2012-03-18 DIAGNOSIS — K922 Gastrointestinal hemorrhage, unspecified: Secondary | ICD-10-CM

## 2012-03-18 MED ORDER — SODIUM CHLORIDE 0.9 % IJ SOLN
10.0000 mL | INTRAMUSCULAR | Status: DC | PRN
  Filled 2012-03-18: qty 10

## 2012-03-18 MED ORDER — SODIUM CHLORIDE 0.9 % IV SOLN
Freq: Once | INTRAVENOUS | Status: AC
  Administered 2012-03-18: 12:00:00 via INTRAVENOUS

## 2012-03-18 MED ORDER — SODIUM CHLORIDE 0.9 % IV SOLN
1020.0000 mg | Freq: Once | INTRAVENOUS | Status: AC
  Administered 2012-03-18: 1020 mg via INTRAVENOUS
  Filled 2012-03-18: qty 34

## 2012-03-18 NOTE — Patient Instructions (Signed)
Ferumoxytol injection What is this medicine? FERUMOXYTOL is an iron complex. Iron is used to make healthy red blood cells, which carry oxygen and nutrients throughout the body. This medicine is used to treat iron deficiency anemia in people with chronic kidney disease. This medicine may be used for other purposes; ask your health care provider or pharmacist if you have questions. What should I tell my health care provider before I take this medicine? They need to know if you have any of these conditions: -anemia not caused by low iron levels -high levels of iron in the blood -magnetic resonance imaging (MRI) test scheduled -an unusual or allergic reaction to iron, other medicines, foods, dyes, or preservatives -pregnant or trying to get pregnant -breast-feeding How should I use this medicine? This medicine is for infusion into a vein. It is given by a health care professional in a hospital or clinic setting. Talk to your pediatrician regarding the use of this medicine in children. Special care may be needed. Overdosage: If you think you've taken too much of this medicine contact a poison control center or emergency room at once. Overdosage: If you think you have taken too much of this medicine contact a poison control center or emergency room at once. NOTE: This medicine is only for you. Do not share this medicine with others. What if I miss a dose? It is important not to miss your dose. Call your doctor or health care professional if you are unable to keep an appointment. What may interact with this medicine? This medicine may interact with the following medications: -other iron products This list may not describe all possible interactions. Give your health care provider a list of all the medicines, herbs, non-prescription drugs, or dietary supplements you use. Also tell them if you smoke, drink alcohol, or use illegal drugs. Some items may interact with your medicine. What should I watch  for while using this medicine? Visit your doctor or healthcare professional regularly. Tell your doctor or healthcare professional if your symptoms do not start to get better or if they get worse. You may need blood work done while you are taking this medicine. You may need to follow a special diet. Talk to your doctor. Foods that contain iron include: whole grains/cereals, dried fruits, beans, or peas, leafy green vegetables, and organ meats (liver, kidney). What side effects may I notice from receiving this medicine? Side effects that you should report to your doctor or health care professional as soon as possible: -allergic reactions like skin rash, itching or hives, swelling of the face, lips, or tongue -breathing problems -changes in blood pressure -feeling faint or lightheaded, falls -fever or chills -flushing, sweating, or hot feelings -swelling of the ankles or feet Side effects that usually do not require medical attention (Report these to your doctor or health care professional if they continue or are bothersome.): -diarrhea -headache -nausea, vomiting -stomach pain This list may not describe all possible side effects. Call your doctor for medical advice about side effects. You may report side effects to FDA at 1-800-FDA-1088. Where should I keep my medicine? This drug is given in a hospital or clinic and will not be stored at home. NOTE: This sheet is a summary. It may not cover all possible information. If you have questions about this medicine, talk to your doctor, pharmacist, or health care provider.  2012, [employer]vier/Gold Standard. (05/31/2008 9:48:25 PM) 

## 2012-03-23 ENCOUNTER — Other Ambulatory Visit: Payer: Self-pay | Admitting: Sports Medicine

## 2012-03-23 ENCOUNTER — Ambulatory Visit
Admission: RE | Admit: 2012-03-23 | Discharge: 2012-03-23 | Disposition: A | Discharge status: Home / Self Care | Payer: Medicare Other | Source: Ambulatory Visit | Attending: Sports Medicine | Admitting: Sports Medicine

## 2012-03-23 DIAGNOSIS — S42309A Unspecified fracture of shaft of humerus, unspecified arm, initial encounter for closed fracture: Secondary | ICD-10-CM

## 2012-03-29 ENCOUNTER — Ambulatory Visit: Payer: Medicare Other | Admitting: Hematology & Oncology

## 2012-03-29 ENCOUNTER — Other Ambulatory Visit: Payer: Medicare Other | Admitting: Lab

## 2012-04-02 ENCOUNTER — Encounter (INDEPENDENT_AMBULATORY_CARE_PROVIDER_SITE_OTHER): Payer: Self-pay | Admitting: Surgery

## 2012-04-08 ENCOUNTER — Encounter: Payer: Self-pay | Admitting: Hematology & Oncology

## 2012-04-12 ENCOUNTER — Encounter: Payer: Self-pay | Admitting: *Deleted

## 2012-04-12 NOTE — Progress Notes (Signed)
Letter written by Dr. [name] to Dr. Starling Manns faxed to His office at [phone]

## 2012-04-16 ENCOUNTER — Encounter (HOSPITAL_COMMUNITY): Payer: Self-pay | Admitting: Pharmacy Technician

## 2012-04-20 ENCOUNTER — Other Ambulatory Visit: Payer: Self-pay | Admitting: Orthopedic Surgery

## 2012-04-22 ENCOUNTER — Encounter (HOSPITAL_COMMUNITY): Payer: Self-pay

## 2012-04-22 ENCOUNTER — Encounter (HOSPITAL_COMMUNITY)
Admission: RE | Admit: 2012-04-22 | Discharge: 2012-04-22 | Disposition: A | Discharge status: Home / Self Care | Payer: Medicare Other | Source: Ambulatory Visit | Attending: Orthopedic Surgery | Admitting: Orthopedic Surgery

## 2012-04-22 DIAGNOSIS — Z01818 Encounter for other preprocedural examination: Secondary | ICD-10-CM | POA: Insufficient documentation

## 2012-04-22 DIAGNOSIS — Z01812 Encounter for preprocedural laboratory examination: Secondary | ICD-10-CM | POA: Insufficient documentation

## 2012-04-22 HISTORY — DX: Pain in unspecified joint: M25.50

## 2012-04-22 HISTORY — DX: Personal history of other diseases of the digestive system: Z87.19

## 2012-04-22 HISTORY — DX: Headache: R51

## 2012-04-22 HISTORY — DX: Gastro-esophageal reflux disease without esophagitis: K21.9

## 2012-04-22 HISTORY — DX: Personal history of colonic polyps: Z86.010

## 2012-04-22 HISTORY — DX: Unspecified cataract: H26.9

## 2012-04-22 HISTORY — DX: Unspecified hemorrhoids: K64.9

## 2012-04-22 HISTORY — DX: Personal history of colon polyps, unspecified: Z86.0100

## 2012-04-22 LAB — CBC
HCT: 40.6 % (ref 36.0–46.0)
Hemoglobin: 13.4 g/dL (ref 12.0–15.0)
MCV: 88.3 fL (ref 78.0–100.0)
RBC: 4.6 MIL/uL (ref 3.87–5.11)
WBC: 5.5 10*3/uL (ref 4.0–10.5)

## 2012-04-22 LAB — URINALYSIS, ROUTINE W REFLEX MICROSCOPIC
Bilirubin Urine: NEGATIVE
Ketones, ur: NEGATIVE mg/dL
Protein, ur: 30 mg/dL — AB
Specific Gravity, Urine: 1.017 (ref 1.005–1.030)
Urobilinogen, UA: 1 mg/dL (ref 0.0–1.0)

## 2012-04-22 LAB — BASIC METABOLIC PANEL
CO2: 33 mEq/L — ABNORMAL HIGH (ref 19–32)
Chloride: 104 mEq/L (ref 96–112)
GFR calc Af Amer: >90 mL/min (ref >90)
Potassium: 4 mEq/L (ref 3.5–5.1)
Sodium: 141 mEq/L (ref 135–145)

## 2012-04-22 LAB — PROTIME-INR: INR: 1.19 (ref 0.00–1.49)

## 2012-04-22 LAB — SURGICAL PCR SCREEN: Staphylococcus aureus: NEGATIVE

## 2012-04-22 NOTE — Progress Notes (Signed)
Pt doesn't have a cardiologist  Denies ever having an echo/heart cath/stress test  Medical MD is Dr.Tammy 7 with [facility] in [place] and cxr requested from Dr.Spears that was done a week ago

## 2012-04-22 NOTE — Pre-Procedure Instructions (Signed)
[place]  04/22/2012   Your procedure is scheduled on:  Tues, Aug 6 @ 7:30 AM  Report to [place] at 5:30 AM.  Call this number if you have problems the morning of surgery: [phone]   Remember:   Do not eat food:After Midnight.  Take these medicines the morning of surgery with A SIP OF WATER: Citalopram(Celexa),Nadolol(Corgard),and Pantoprazole(Protonix)   Do not wear jewelry, make-up or nail polish.  Do not wear lotions, powders, or perfumes.   Do not shave 48 hours prior to surgery.  Do not bring valuables to the hospital.  Contacts, dentures or bridgework may not be worn into surgery.  Leave suitcase in the car. After surgery it may be brought to your room.  For patients admitted to the hospital, checkout time is 11:00 AM the day of discharge.   Patients discharged the day of surgery will not be allowed to drive home.  Special Instructions: CHG Shower Use Special Wash: 1/2 bottle night before surgery and 1/2 bottle morning of surgery.   Please read over the following fact sheets that you were given: Pain Booklet, Coughing and Deep Breathing, Blood Transfusion Information, MRSA Information and Surgical Site Infection Prevention

## 2012-04-22 NOTE — Progress Notes (Signed)
Sleep study was done > 38yr ago and was normal not requiring CPAP

## 2012-04-22 NOTE — Progress Notes (Addendum)
Contacted Dr. Luanna Cole office to report abnormal urinalysis. Spoke with Benjie Karvonen, [place] on call, states call Dr. Luanna Cole office in the morning to report abnormal  urine.

## 2012-04-23 ENCOUNTER — Encounter (HOSPITAL_COMMUNITY): Payer: Self-pay | Admitting: Vascular Surgery

## 2012-04-23 ENCOUNTER — Encounter (HOSPITAL_COMMUNITY): Payer: Self-pay | Admitting: *Deleted

## 2012-04-23 ENCOUNTER — Other Ambulatory Visit: Payer: Self-pay | Admitting: *Deleted

## 2012-04-23 NOTE — Progress Notes (Signed)
CALLED DR LANDAU'S OFFICE TO REPORT .Marland KitchenMarland Kitchen URINE ABN ... THEY WILL LOOK UP URINE IN EPIC.   ALSO REPORTED THE PLATELETS  WERE LOW .Marland KitchenMarland Kitchen

## 2012-04-23 NOTE — Consult Note (Signed)
Anesthesia Chart Review:  Patient is a 71 year old female scheduled for ORIF of her left proximal humerus fracture on 04/27/12 by Dr. Mardelle Matte.  History includes former smoker, confusion after anesthesia, progressive dementia (per Dr. 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  She is followed by Hematologist/Oncologist is Dr. [name] who is aware of planned procedure.  PCP is Dr. Florina Ou who medically cleared patient for this procedure.    EKG on 04/12/12 [facility]) showed SB @ 68.  CXR on 04/12/12 [facility]) showed no acute cardiopulmonary disease.  Lateral view is non-diagnostic.  Labs reviewed:  CO2 33, Cr 0.71.  H/H were WNL. PLT were 86 (down from 114 on 03/09/12).  Her coags were reasonable.  HFP was not done, so will get on the day of surgery and recheck her PLT count.  With her tendency for bleeding, will also add a T&S.  Her UA and PLT results were already called to Dr. Luanna Cole office by a Short Stay RN.  Myra Gianotti, PA-C

## 2012-04-27 ENCOUNTER — Encounter (HOSPITAL_COMMUNITY): Admission: RE | Payer: Self-pay | Source: Ambulatory Visit

## 2012-04-27 ENCOUNTER — Ambulatory Visit (HOSPITAL_COMMUNITY): Admission: RE | Admit: 2012-04-27 | Payer: Medicare Other | Source: Ambulatory Visit | Admitting: Orthopedic Surgery

## 2012-04-27 SURGERY — OPEN REDUCTION INTERNAL FIXATION (ORIF) PROXIMAL HUMERUS FRACTURE
Anesthesia: Choice | Laterality: Left

## 2012-05-04 ENCOUNTER — Ambulatory Visit (HOSPITAL_BASED_OUTPATIENT_CLINIC_OR_DEPARTMENT_OTHER): Payer: Medicare Other | Admitting: Medical

## 2012-05-04 ENCOUNTER — Other Ambulatory Visit (HOSPITAL_BASED_OUTPATIENT_CLINIC_OR_DEPARTMENT_OTHER): Payer: Medicare Other | Admitting: Lab

## 2012-05-04 VITALS — BP 124/60 | HR 68 | Temp 98.0°F | Resp 20 | Ht 65.0 in | Wt 213.0 lb

## 2012-05-04 DIAGNOSIS — I868 Varicose veins of other specified sites: Secondary | ICD-10-CM

## 2012-05-04 DIAGNOSIS — D509 Iron deficiency anemia, unspecified: Secondary | ICD-10-CM

## 2012-05-04 DIAGNOSIS — C189 Malignant neoplasm of colon, unspecified: Secondary | ICD-10-CM

## 2012-05-04 DIAGNOSIS — D500 Iron deficiency anemia secondary to blood loss (chronic): Secondary | ICD-10-CM

## 2012-05-04 DIAGNOSIS — K7460 Unspecified cirrhosis of liver: Secondary | ICD-10-CM

## 2012-05-04 DIAGNOSIS — K922 Gastrointestinal hemorrhage, unspecified: Secondary | ICD-10-CM

## 2012-05-04 DIAGNOSIS — D696 Thrombocytopenia, unspecified: Secondary | ICD-10-CM

## 2012-05-04 LAB — CBC WITH DIFFERENTIAL (CANCER CENTER ONLY)
BASO#: 0 10*3/uL (ref 0.0–0.2)
EOS%: 1.5 % (ref 0.0–7.0)
Eosinophils Absolute: 0.1 10*3/uL (ref 0.0–0.5)
HCT: 37.7 % (ref 34.8–46.6)
HGB: 12.8 g/dL (ref 11.6–15.9)
MCH: 29.8 pg (ref 26.0–34.0)
MCHC: 34 g/dL (ref 32.0–36.0)
MONO%: 10.9 % (ref 0.0–13.0)
NEUT#: 2.1 10*3/uL (ref 1.5–6.5)
NEUT%: 63 % (ref 39.6–80.0)
RBC: 4.29 10*6/uL (ref 3.70–5.32)

## 2012-05-04 LAB — TECHNOLOGIST REVIEW CHCC SATELLITE

## 2012-05-04 LAB — IRON AND TIBC
%SAT: 29 % (ref 20–55)
Iron: 68 ug/dL (ref 42–145)
TIBC: 235 ug/dL — ABNORMAL LOW (ref 250–470)

## 2012-05-04 LAB — FERRITIN: Ferritin: 121 ng/mL (ref 10–291)

## 2012-05-04 NOTE — Progress Notes (Signed)
Diagnoses: #1.  Iron deficiency anemia, secondary to gastrointestinal bleed. #2 progressive nonalcoholic steatohepatitis with varices. #3 leukopenia/thrombocytopenia, secondary to nonalcoholic steatohepatitis. 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  Current therapy: IV iron as needed.  Interim history: [name] comes in today for an office followup visit.  Overall, she, reports, that she's been doing relatively well.  He does not report any more type of GI bleeding.  She does have varices.  Her left arm.  Still remains in a sling from a broken humerus.  She sees Dr. Percell Miller for this.  The last time, she received IV iron was on 03/18/2012.  She received at 1,060m  Her ferritin was 24.  Iron 14, and 5% saturation.  She continues to have issues with dementia and her husband cares for her.  She is eating okay.  She is not having any nausea, vomiting.  She does not report any cough, chest pain, or shortness of breath, any fevers, chills, or night sweats.  She does have some lower extremity swelling.  She denies any obvious, bleeding.  Review of Systems: Pt. Denies any changes in their vision, hearing, adenopathy, fevers, chills, nausea, vomiting, diarrhea, constipation, chest pain, shortness of breath, passing blood, passing out, blacking out,  any changes in skin, joints, neurologic or psychiatric except as noted.  Physical Exam: This is a pleasant, 71year old, chronically, ill-appearing, white female, in no obvious distress Vitals: Temperature 98.0 degrees, pulse 68, respirations 20, blood pressure 124, for 60, weight 213 pounds HEENT reveals a normocephalic, atraumatic skull, no scleral icterus, no oral lesions  Neck is supple without any cervical or supraclavicular adenopathy.  Lungs are clear to auscultation bilaterally. There are no wheezes, rales or rhonci Cardiac is regular rate and rhythm with a normal S1 and  S2. There are no murmurs, rubs, or bruits.  Abdomen is soft with good bowel sounds, there is no palpable mass. There is no palpable hepatosplenomegaly. There is no palpable fluid wave.  Musculoskeletal no tenderness of the spine, ribs, or hips.  Extremities there are no clubbing, cyanosis.  She does have 1+ pitting edema of the lower extremities.  Her left arm is in a sling for immobilization. Skin no petechia, purpura or ecchymosis Neurologic is nonfocal.  Laboratory Data: White count 3.4, hemoglobin 12.8, hematocrit 37.7, platelets 82,000  Current Outpatient Prescriptions on File Prior to Visit  Medication Sig Dispense Refill  . Cholecalciferol (VITAMIN D3) 1000 UNITS CAPS Take 1 capsule by mouth daily.        . Cinnamon 500 MG capsule Take 500 mg by mouth daily.       . citalopram (CELEXA) 40 MG tablet Take 20 mg by mouth daily.      .Marland Kitchendonepezil (ARICEPT) 10 MG tablet Take 10 mg by mouth at bedtime.      . fish oil-omega-3 fatty acids 1000 MG capsule Take 2 g by mouth daily.        . metoprolol tartrate (LOPRESSOR) 25 MG tablet Take 25 mg by mouth 2 (two) times daily.      . nadolol (CORGARD) 20 MG tablet Take 10 mg by mouth daily.      . pantoprazole (PROTONIX) 40 MG tablet Take 1 tablet (40 mg total) by mouth 2 (two) times daily.  60 tablet  1  . ramipril (ALTACE) 5 MG capsule Take 5 mg by mouth Daily.       Assessment/Plan: This is  a chronically, ill-appearing, 71 year old, female, with the following issues: #1.  Iron deficiency anemia, secondary to gastrointestinal bleed-her hemoglobin looks good at 12.8.  Today.  I suspect, she will not need iron.  She also has progressive cirrhosis.  She has varices.  We will continue to monitor her closely and give IV iron as needed.  #2 followup-we will follow back up with [name] in 2 months, but before then should there be questions or concerns.

## 2012-05-05 ENCOUNTER — Telehealth: Payer: Self-pay | Admitting: *Deleted

## 2012-05-05 NOTE — Telephone Encounter (Addendum)
Message copied by Orlando Penner on Wed May 05, 2012  1:33 PM ------      Message from: [name]      Created: Tue May 04, 2012  8:37 PM       Call her husband - iron is great!!  Laurey Arrow This message given to pt.

## 2012-06-01 ENCOUNTER — Encounter (INDEPENDENT_AMBULATORY_CARE_PROVIDER_SITE_OTHER): Payer: Self-pay | Admitting: Surgery

## 2012-06-01 ENCOUNTER — Ambulatory Visit (INDEPENDENT_AMBULATORY_CARE_PROVIDER_SITE_OTHER): Payer: Medicare Other | Admitting: Surgery

## 2012-06-01 VITALS — BP 134/78 | HR 84 | Temp 97.6°F | Resp 16 | Ht 65.5 in | Wt 213.8 lb

## 2012-06-01 DIAGNOSIS — K439 Ventral hernia without obstruction or gangrene: Secondary | ICD-10-CM

## 2012-06-01 NOTE — Progress Notes (Signed)
Chief complaint: Incisional hernia  History of present illness: I saw this patient back in April with a large but reducible and asymptomatic incisional hernia. At that point, after consultation with her oncologist, we thought that she was too great of a risk to have surgical intervention. She was noted to have varices as well as progressive dementia.more recently she fell when getting ready to go on a cruise and broke her arm. She is not considered a candidate for surgical intervention and has been in a sling. She has had no symptoms regarding her ventral hernia. She eats what she wants. She is having no pain. The hernia is always been reducible.  Past history family history review of systems are noted in the electronic medical record and not redictated here.  Exam: Vital signs:BP 134/78  Pulse 84  Temp 97.6 F (36.4 C)  Resp 16  Ht 5' 5.5" (1.664 m)  Wt 213 lb 12.8 oz (96.979 kg)  BMI 35.04 kg/m2 General: Patient alert awake but clearly has dementia. No acute distress. Abdomen: Soft and benign. There is a moderate-sized hernia but there is no likelihood, I think, is becoming incarcerated.  Impression: Stable ventral incisional hernia asymptomatic  Plan: I will see back when necessary. This point do not believe any operative intervention is appropriate I don't think she'll tolerate a major laparotomy and survive.

## 2012-06-01 NOTE — Patient Instructions (Signed)
I don't think surgery for the hernia is appropriate at this time. If you start having more problems let me see you again.

## 2012-07-05 ENCOUNTER — Ambulatory Visit (HOSPITAL_BASED_OUTPATIENT_CLINIC_OR_DEPARTMENT_OTHER): Payer: Medicare Other | Admitting: Hematology & Oncology

## 2012-07-05 ENCOUNTER — Other Ambulatory Visit (HOSPITAL_BASED_OUTPATIENT_CLINIC_OR_DEPARTMENT_OTHER): Payer: Medicare Other | Admitting: Lab

## 2012-07-05 VITALS — BP 127/57 | HR 53 | Temp 98.0°F | Resp 16 | Ht 65.0 in | Wt 218.0 lb

## 2012-07-05 DIAGNOSIS — K7581 Nonalcoholic steatohepatitis (NASH): Secondary | ICD-10-CM

## 2012-07-05 DIAGNOSIS — D509 Iron deficiency anemia, unspecified: Secondary | ICD-10-CM

## 2012-07-05 DIAGNOSIS — I8390 Asymptomatic varicose veins of unspecified lower extremity: Secondary | ICD-10-CM

## 2012-07-05 DIAGNOSIS — F0390 Unspecified dementia without behavioral disturbance: Secondary | ICD-10-CM

## 2012-07-05 DIAGNOSIS — K7689 Other specified diseases of liver: Secondary | ICD-10-CM

## 2012-07-05 DIAGNOSIS — K922 Gastrointestinal hemorrhage, unspecified: Secondary | ICD-10-CM

## 2012-07-05 LAB — CBC WITH DIFFERENTIAL (CANCER CENTER ONLY)
BASO%: 0.7 % (ref 0.0–2.0)
EOS%: 3.1 % (ref 0.0–7.0)
HGB: 12.3 g/dL (ref 11.6–15.9)
LYMPH#: 0.8 10*3/uL — ABNORMAL LOW (ref 0.9–3.3)
MCH: 32.2 pg (ref 26.0–34.0)
MCHC: 34.7 g/dL (ref 32.0–36.0)
MONO%: 10.2 % (ref 0.0–13.0)
NEUT#: 1.8 10*3/uL (ref 1.5–6.5)
Platelets: 63 10*3/uL — ABNORMAL LOW (ref 145–400)

## 2012-07-05 LAB — FERRITIN: Ferritin: 56 ng/mL (ref 10–291)

## 2012-07-05 LAB — CHCC SATELLITE - SMEAR

## 2012-07-05 LAB — IRON AND TIBC: Iron: 107 ug/dL (ref 42–145)

## 2012-07-05 NOTE — Progress Notes (Signed)
This office note has been dictated.

## 2012-07-06 NOTE — Progress Notes (Signed)
CC:   [name], M.D. Erica Bridge, MD  DIAGNOSES: 1. Iron deficiency anemia secondary to intermittent gastrointestinal     bleed. 2. NASH (nonalcoholic steatohepatitis) with varices. 3. 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 4. Remote history of stage II colon cancer and stage IV bladder     cancer.  CURRENT THERAPY:  IV iron as indicated.  INTERIM HISTORY:  [name] comes in for followup.  She broke her left arm.  She is not a candidate for surgery because of her other health issues.  She has a sling on and keeps the left arm immobilized.  Her dementia has really been a problem.  That is 1 of the reasons why she cannot have surgery was that it would be tough to get her to cooperate after surgery with her dementia.  Her last iron was given back in June.  She tolerates iron very well.  When we last saw her in August, her ferritin was 121 with a saturation of 29%.  There has been no obvious bleeding.  Appetite has been okay.  There has been no leg swelling.  There has been no cough.  She has had no headache.  PHYSICAL EXAMINATION:  General:  This is a well-developed, well- nourished white female in no obvious distress.  Vital signs:  98, pulse of 53, respiratory rate 16, blood pressure 127/57.  Weight is 218.  Head and neck:  Shows a normocephalic, atraumatic skull.  There are no ocular or oral lesions.  There are no palpable cervical or supraclavicular lymph nodes.  Lungs:  Clear bilaterally.  Cardiac:  Regular rate and rhythm with a normal S1, S2.  There are no murmurs, rubs or bruits. Abdomen:  Soft with good bowel sounds.  She has a urostomy bag that is intact.  There is no fluid wave.  She has well-healed laparotomy scars. There is no palpable hepatomegaly.  Spleen tip may be palpable at the left costal margin.  Extremities:  Shows no clubbing, cyanosis or edema. Neurological:  Shows no focal neurological deficits.  LABORATORY STUDIES:  White  cell count is 3, hemoglobin 12.3, hematocrit 35.4, platelet count 63,000.  MCV is 93.  IMPRESSION:  [name] is a very charming 72 year old white female with bad dementia.  She has NASH (nonalcoholic steatohepatitis).  She has gastrointestinal bleeding from this.  She has intermittent iron deficiency anemia.  Her white cell count and platelet count are a little bit lower today. They do seem to fluctuate.  As she is currently asymptomatic, I think we can just watch her for now.  I do want to get her back in about 3 months' time.  I think this would be reasonable for followup.  We will try to get her through the holidays.    ______________________________ [name], M.D. PRE/MEDQ  D:  07/05/2012  T:  07/06/2012  Job:  [id number]

## 2012-10-05 ENCOUNTER — Ambulatory Visit (HOSPITAL_BASED_OUTPATIENT_CLINIC_OR_DEPARTMENT_OTHER): Payer: Medicare Other | Admitting: Medical

## 2012-10-05 ENCOUNTER — Other Ambulatory Visit (HOSPITAL_BASED_OUTPATIENT_CLINIC_OR_DEPARTMENT_OTHER): Payer: Medicare Other | Admitting: Lab

## 2012-10-05 VITALS — BP 119/57 | HR 72 | Temp 97.9°F | Resp 16 | Ht 65.0 in | Wt 216.0 lb

## 2012-10-05 DIAGNOSIS — K7460 Unspecified cirrhosis of liver: Secondary | ICD-10-CM

## 2012-10-05 DIAGNOSIS — K7581 Nonalcoholic steatohepatitis (NASH): Secondary | ICD-10-CM

## 2012-10-05 DIAGNOSIS — D509 Iron deficiency anemia, unspecified: Secondary | ICD-10-CM

## 2012-10-05 DIAGNOSIS — D500 Iron deficiency anemia secondary to blood loss (chronic): Secondary | ICD-10-CM

## 2012-10-05 DIAGNOSIS — I8390 Asymptomatic varicose veins of unspecified lower extremity: Secondary | ICD-10-CM

## 2012-10-05 DIAGNOSIS — D709 Neutropenia, unspecified: Secondary | ICD-10-CM

## 2012-10-05 LAB — CHCC SATELLITE - SMEAR

## 2012-10-05 LAB — CBC WITH DIFFERENTIAL (CANCER CENTER ONLY)
BASO#: 0 10*3/uL (ref 0.0–0.2)
BASO%: 0.4 % (ref 0.0–2.0)
EOS%: 2.2 % (ref 0.0–7.0)
HCT: 35.3 % (ref 34.8–46.6)
HGB: 11.9 g/dL (ref 11.6–15.9)
LYMPH#: 0.7 10*3/uL — ABNORMAL LOW (ref 0.9–3.3)
LYMPH%: 26.1 % (ref 14.0–48.0)
MCH: 30.9 pg (ref 26.0–34.0)
MCHC: 33.7 g/dL (ref 32.0–36.0)
MCV: 92 fL (ref 81–101)
MONO%: 12.7 % (ref 0.0–13.0)
NEUT%: 58.6 % (ref 39.6–80.0)
RDW: 15.4 % (ref 11.1–15.7)

## 2012-10-05 LAB — IRON AND TIBC: TIBC: 288 ug/dL (ref 250–470)

## 2012-10-05 MED ORDER — CEPHALEXIN 500 MG PO CAPS: 500.0000 mg | ORAL_CAPSULE | Freq: Four times a day (QID) | ORAL | Status: DC

## 2012-10-05 NOTE — Progress Notes (Signed)
Diagnoses: #1.  Iron deficiency anemia, secondary to gastrointestinal bleed. #2 progressive nonalcoholic steatohepatitis with varices. #3 leukopenia/thrombocytopenia, secondary to nonalcoholic steatohepatitis. 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  Current therapy: IV iron as needed.  Interim history: [name] comes in today for an office followup visit.  Overall, she, reports, that she's been doing relatively well. She does not report any more type of GI bleeding.  She does have varices. The last time, she received IV iron was on 03/18/2012.  She received at 1,033m  her last iron panel.  Back in October, revealed a ferritin of 56, an iron of 107, with 43% saturation..  She continues to have issues with dementia and her husband cares for her.  She is eating okay.  She is not having any nausea, vomiting.  She does not report any cough, chest pain, or shortness of breath, any fevers, chills, or night sweats.  She does have some lower extremity swelling.  She denies any obvious, bleeding.  Her husband does report an area of concern around the umbilicus.  He states it is along.  Her laparotomy scar and it is somewhat red.  He, reports, that he does use fluid intermittently.  He's been cleaning it with peroxide and triple antibiotic.  He does report, that one day.  She was quite sweaty and felt hot, but he did not take her temperature.  She states this was part of menopause.  She does not have a fever today.  I informed.  Her husband that I will go ahead and give her an antibiotic, however, if this area.  Does not clear up or she should develop any type of fevers, or if this area worsens, that she needs to get into her primary care physician.  He voiced his understanding.  Review of Systems: Constitutional:Negative for malaise/fatigue, fever, chills, weight loss, diaphoresis, activity change, appetite change, and unexpected  weight change.  HEENT: Negative for double vision, blurred vision, visual loss, ear pain, tinnitus, congestion, rhinorrhea, epistaxis sore throat or sinus disease, oral pain/lesion, tongue soreness Respiratory: Negative for cough, chest tightness, shortness of breath, wheezing and stridor.  Cardiovascular: Negative for chest pain, palpitations, leg swelling, orthopnea, PND, DOE or claudication Gastrointestinal: Negative for nausea, vomiting, abdominal pain, diarrhea, constipation, blood in stool, melena, hematochezia, abdominal distention, anal bleeding, rectal pain, anorexia and hematemesis.  Genitourinary: Negative for dysuria, frequency, hematuria,  Musculoskeletal: Negative for myalgias, back pain, joint swelling, arthralgias and gait problem.  Skin: Negative for rash, color change, pallor and wound.  Neurological:. Negative for dizziness/light-headedness, tremors, seizures, syncope, facial asymmetry, speech difficulty, weakness, numbness, headaches and paresthesias.  Hematological: Negative for adenopathy. Does not bruise/bleed easily.  Psychiatric/Behavioral:  Negative for depression, no loss of interest in normal activity or change in sleep pattern.   Physical Exam: This is a pleasant, 72year old, chronically, ill-appearing, white female, in no obvious distress Vitals: Temperature 97.7 degrees, pulse 52, respirations 18, blood pressure 119/57, weight 216 pounds HEENT reveals a normocephalic, atraumatic skull, no scleral icterus, no oral lesions  Neck is supple without any cervical or supraclavicular adenopathy.  Lungs are clear to auscultation bilaterally. There are no wheezes, rales or rhonci Cardiac is regular rate and rhythm with a normal S1 and S2. There are no murmurs, rubs, or bruits.  Abdomen is soft with good bowel sounds, there is no palpable mass.  She has a well healed laparotomy scar.  There is no  palpable hepatomegaly.  Spleen tip may be palpable at the left costal margin.  There is no palpable fluid wave.  Musculoskeletal no tenderness of the spine, ribs, or hips.  Extremities there are no clubbing, cyanosis.  She does have 1+ pitting edema of the lower extremities.  Her left arm is in a sling for immobilization. Skin no petechia, purpura or ecchymosis .  She does have an area of erythema around her umbilicus.  There is currently no obvious pus.  She does not have any streaking from this area. Neurologic is nonfocal.  Laboratory Data: White count 2.7, hemoglobin 11.9, hematocrit 35.3, platelets 62,000  Current Outpatient Prescriptions on File Prior to Visit  Medication Sig Dispense Refill  . Cholecalciferol (VITAMIN D3) 1000 UNITS CAPS Take 1 capsule by mouth daily.        . Cinnamon 500 MG capsule Take 500 mg by mouth daily.       . citalopram (CELEXA) 40 MG tablet Take 20 mg by mouth daily.      . fish oil-omega-3 fatty acids 1000 MG capsule Take 2 g by mouth daily.        . nadolol (CORGARD) 20 MG tablet Take 10 mg by mouth daily.      Marland Kitchen neomycin-polymyxin-hydrocortisone (CORTISPORIN) otic solution Place into both ears 3 (three) times daily.       . pantoprazole (PROTONIX) 40 MG tablet Take 1 tablet (40 mg total) by mouth 2 (two) times daily.  60 tablet  1  . ramipril (ALTACE) 5 MG capsule Take 5 mg by mouth Daily.      . traMADol (ULTRAM) 50 MG tablet 50 mg every 6 (six) hours as needed.        Assessment/Plan: This is a chronically, ill-appearing, 72 year old, female, with the following issues:  #1.  Iron deficiency anemia, secondary to gastrointestinal bleed-her hemoglobin looks good at 11.9  .  We are getting an iron panel on her today.  She also has progressive cirrhosis.  She has varices.  We will continue to monitor her closely and give IV iron as needed.  #2.  Neutropenia/thrombocytopenia.  This is most likely correlated with her nonalcoholic steatohepatitis with varices.  She is currently asymptomatic.  #3.  Possible umbilical abscess.  Again,  this is right along her surgical scar.  I will go ahead and give her Keflex, 500 mg to take 4 times a day for 7 days.  If her symptoms should worsen or she should develop a fever.  She needs to contact her primary care physician.  #4 followup-we will follow back up with [name] in 3 months, but before then should there be questions or concerns.

## 2012-10-07 ENCOUNTER — Telehealth: Payer: Self-pay | Admitting: *Deleted

## 2012-10-07 ENCOUNTER — Telehealth: Payer: Self-pay | Admitting: Hematology & Oncology

## 2012-10-07 ENCOUNTER — Other Ambulatory Visit: Payer: Self-pay | Admitting: *Deleted

## 2012-10-07 DIAGNOSIS — D509 Iron deficiency anemia, unspecified: Secondary | ICD-10-CM

## 2012-10-07 NOTE — Telephone Encounter (Signed)
Pt made 1-17 iron infusion appointment

## 2012-10-07 NOTE — Telephone Encounter (Signed)
Message copied by Rico Ala on Thu Oct 07, 2012 11:04 AM ------      Message from: Burney Gauze R      Created: Wed Oct 06, 2012  7:40 AM       Call her husband:  Iron is low again!!  Need IV Feraheme at 1025m x 1 dose! Please set up!!  Thanks!! PLaurey Arrow

## 2012-10-07 NOTE — Telephone Encounter (Signed)
Called patient to let her know that her iron is low again per dr. [name].  Needs IV Feraheme 1020 mg  X 1 dose.  Patient understands this but will have her husband call us to make appt.

## 2012-10-08 ENCOUNTER — Ambulatory Visit (HOSPITAL_BASED_OUTPATIENT_CLINIC_OR_DEPARTMENT_OTHER): Payer: Medicare Other

## 2012-10-08 VITALS — BP 127/71 | HR 50 | Temp 96.7°F | Resp 20

## 2012-10-08 DIAGNOSIS — D509 Iron deficiency anemia, unspecified: Secondary | ICD-10-CM

## 2012-10-08 MED ORDER — SODIUM CHLORIDE 0.9 % IV SOLN
Freq: Once | INTRAVENOUS | Status: AC
  Administered 2012-10-08: 12:00:00 via INTRAVENOUS

## 2012-10-08 MED ORDER — SODIUM CHLORIDE 0.9 % IV SOLN
1020.0000 mg | Freq: Once | INTRAVENOUS | Status: AC
  Administered 2012-10-08: 1020 mg via INTRAVENOUS
  Filled 2012-10-08: qty 34

## 2012-10-08 NOTE — Patient Instructions (Signed)
Ferumoxytol injection What is this medicine? FERUMOXYTOL is an iron complex. Iron is used to make healthy red blood cells, which carry oxygen and nutrients throughout the body. This medicine is used to treat iron deficiency anemia in people with chronic kidney disease. This medicine may be used for other purposes; ask your health care provider or pharmacist if you have questions. What should I tell my health care provider before I take this medicine? They need to know if you have any of these conditions: -anemia not caused by low iron levels -high levels of iron in the blood -magnetic resonance imaging (MRI) test scheduled -an unusual or allergic reaction to iron, other medicines, foods, dyes, or preservatives -pregnant or trying to get pregnant -breast-feeding How should I use this medicine? This medicine is for infusion into a vein. It is given by a health care professional in a hospital or clinic setting. Talk to your pediatrician regarding the use of this medicine in children. Special care may be needed. Overdosage: If you think you've taken too much of this medicine contact a poison control center or emergency room at once. Overdosage: If you think you have taken too much of this medicine contact a poison control center or emergency room at once. NOTE: This medicine is only for you. Do not share this medicine with others. What if I miss a dose? It is important not to miss your dose. Call your doctor or health care professional if you are unable to keep an appointment. What may interact with this medicine? This medicine may interact with the following medications: -other iron products This list may not describe all possible interactions. Give your health care provider a list of all the medicines, herbs, non-prescription drugs, or dietary supplements you use. Also tell them if you smoke, drink alcohol, or use illegal drugs. Some items may interact with your medicine. What should I watch  for while using this medicine? Visit your doctor or healthcare professional regularly. Tell your doctor or healthcare professional if your symptoms do not start to get better or if they get worse. You may need blood work done while you are taking this medicine. You may need to follow a special diet. Talk to your doctor. Foods that contain iron include: whole grains/cereals, dried fruits, beans, or peas, leafy green vegetables, and organ meats (liver, kidney). What side effects may I notice from receiving this medicine? Side effects that you should report to your doctor or health care professional as soon as possible: -allergic reactions like skin rash, itching or hives, swelling of the face, lips, or tongue -breathing problems -changes in blood pressure -feeling faint or lightheaded, falls -fever or chills -flushing, sweating, or hot feelings -swelling of the ankles or feet Side effects that usually do not require medical attention (Report these to your doctor or health care professional if they continue or are bothersome.): -diarrhea -headache -nausea, vomiting -stomach pain This list may not describe all possible side effects. Call your doctor for medical advice about side effects. You may report side effects to FDA at 1-800-FDA-1088. Where should I keep my medicine? This drug is given in a hospital or clinic and will not be stored at home. NOTE: This sheet is a summary. It may not cover all possible information. If you have questions about this medicine, talk to your doctor, pharmacist, or health care provider.  2012, [employer]vier/Gold Standard. (05/31/2008 9:48:25 PM) 

## 2012-12-16 ENCOUNTER — Other Ambulatory Visit: Payer: Self-pay | Admitting: Gastroenterology

## 2012-12-16 DIAGNOSIS — K7460 Unspecified cirrhosis of liver: Secondary | ICD-10-CM

## 2012-12-17 ENCOUNTER — Ambulatory Visit
Admission: RE | Admit: 2012-12-17 | Discharge: 2012-12-17 | Disposition: A | Discharge status: Home / Self Care | Payer: Medicare Other | Source: Ambulatory Visit | Attending: Gastroenterology | Admitting: Gastroenterology

## 2012-12-17 DIAGNOSIS — K7460 Unspecified cirrhosis of liver: Secondary | ICD-10-CM

## 2012-12-23 ENCOUNTER — Telehealth: Payer: Self-pay | Admitting: Hematology & Oncology

## 2012-12-23 NOTE — Telephone Encounter (Signed)
Per MD to cx 01/03/13 apt and resch.  Apt was resch for 01/18/13.  i called and spoke with patient and gave 01/18/13 apt.  Patient is aware of apt and agrees

## 2012-12-24 ENCOUNTER — Telehealth: Payer: Self-pay | Admitting: Hematology & Oncology

## 2012-12-24 NOTE — Telephone Encounter (Signed)
Patient's husband called and cx 01/18/13 apt and resch for 01/21/13

## 2013-01-03 ENCOUNTER — Other Ambulatory Visit: Payer: Medicare Other | Admitting: Lab

## 2013-01-03 ENCOUNTER — Ambulatory Visit: Payer: Medicare Other | Admitting: Hematology & Oncology

## 2013-01-18 ENCOUNTER — Other Ambulatory Visit: Payer: Medicare Other | Admitting: Lab

## 2013-01-18 ENCOUNTER — Ambulatory Visit: Payer: Medicare Other | Admitting: Hematology & Oncology

## 2013-01-21 ENCOUNTER — Ambulatory Visit (HOSPITAL_BASED_OUTPATIENT_CLINIC_OR_DEPARTMENT_OTHER): Payer: Medicare Other | Admitting: Lab

## 2013-01-21 ENCOUNTER — Encounter (INDEPENDENT_AMBULATORY_CARE_PROVIDER_SITE_OTHER): Payer: Self-pay | Admitting: Surgery

## 2013-01-21 ENCOUNTER — Ambulatory Visit (INDEPENDENT_AMBULATORY_CARE_PROVIDER_SITE_OTHER): Payer: Medicare Other | Admitting: Surgery

## 2013-01-21 ENCOUNTER — Ambulatory Visit (HOSPITAL_BASED_OUTPATIENT_CLINIC_OR_DEPARTMENT_OTHER): Payer: Medicare Other | Admitting: Hematology & Oncology

## 2013-01-21 VITALS — BP 164/82 | HR 60 | Temp 97.4°F | Resp 16 | Ht 65.5 in | Wt 225.0 lb

## 2013-01-21 DIAGNOSIS — K632 Fistula of intestine: Secondary | ICD-10-CM

## 2013-01-21 DIAGNOSIS — D61818 Other pancytopenia: Secondary | ICD-10-CM

## 2013-01-21 DIAGNOSIS — D509 Iron deficiency anemia, unspecified: Secondary | ICD-10-CM

## 2013-01-21 DIAGNOSIS — R161 Splenomegaly, not elsewhere classified: Secondary | ICD-10-CM

## 2013-01-21 DIAGNOSIS — D696 Thrombocytopenia, unspecified: Secondary | ICD-10-CM

## 2013-01-21 DIAGNOSIS — K7460 Unspecified cirrhosis of liver: Secondary | ICD-10-CM

## 2013-01-21 DIAGNOSIS — IMO0001 Reserved for inherently not codable concepts without codable children: Secondary | ICD-10-CM

## 2013-01-21 DIAGNOSIS — F0390 Unspecified dementia without behavioral disturbance: Secondary | ICD-10-CM

## 2013-01-21 LAB — CBC WITH DIFFERENTIAL (CANCER CENTER ONLY)
BASO%: 0.3 % (ref 0.0–2.0)
Eosinophils Absolute: 0.1 10*3/uL (ref 0.0–0.5)
HCT: 37.2 % (ref 34.8–46.6)
LYMPH#: 0.7 10*3/uL — ABNORMAL LOW (ref 0.9–3.3)
MONO#: 0.4 10*3/uL (ref 0.1–0.9)
NEUT%: 64.4 % (ref 39.6–80.0)
RBC: 3.92 10*6/uL (ref 3.70–5.32)
RDW: 14.6 % (ref 11.1–15.7)
WBC: 3.1 10*3/uL — ABNORMAL LOW (ref 3.9–10.0)

## 2013-01-21 LAB — CHCC SATELLITE - SMEAR

## 2013-01-21 LAB — IRON AND TIBC
Iron: 112 ug/dL (ref 42–145)
TIBC: 265 ug/dL (ref 250–470)
UIBC: 153 ug/dL (ref 125–400)

## 2013-01-21 NOTE — Progress Notes (Signed)
This office note has been dictated.

## 2013-01-21 NOTE — Progress Notes (Signed)
NAMEYESHA MUCHOW Robertson       DOB: 01-05-1941           DATE: 01/21/2013       OTR:[id number]  CC:  Chief Complaint  Patient presents with  . 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 Recently she developed some drainage from around the umbilical area of her midline scar. There is a question of some fistula being present. She is referred for surgical evaluation. This was originally noticed about 3 weeks ago the area is completely closed off and healed up. Her husband has been using some over-the-counter "natural "medications on it. She is not have any abdominal symptoms or complaints.  EXAM: Vital signs: BP 164/82  Pulse 60  Temp(Src) 97.4 F (36.3 C) (Oral)  Resp 16  Ht 5' 5.5" (1.664 m)  Wt 225 lb (102.059 kg)  BMI 36.86 kg/m2  General: Patient alert, oriented, NAD  Abdomen: Soft and completely benign. She does have ventral incisional hernias and parastomal hernias which have been stable. The area around the umbilical scar is completely healed over. There is no mass or any evidence of inflammatory process today. IMP: I think she likely had a small stitch abscess which is gradually and spontaneously closed.  PLAN: if this opens up again and begins to drain I have asked her to return so we can reevaluate it.  Mardi Cannady J 01/21/2013

## 2013-01-21 NOTE — Patient Instructions (Signed)
If this area opens and starts to drain again, come back to see me

## 2013-01-22 NOTE — Progress Notes (Signed)
CC:   [name], M.D.  DIAGNOSIS: 1. History of recurrent iron deficiency anemia. 2. 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 3. Remote history of separate stage II (T3 N0 M0) colon cancer. 4. 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  CURRENT THERAPY:  IV iron as indicated.  INTERIM HISTORY:  [name] comes in for followup.  She is doing fairly well.  As always, she is incredibly kind and polite.  She has terrible dementia, but she is very pleasant.  She is very talkative, as always.  She comes in with her husband.  He does all of the talking and remembering for her.  Of note, she last got iron back in January.  She had Feraheme on January 17th.  At that point in time, her ferritin was 14 with an iron saturation of 34%.  She is doing okay.  She unfortunately does not remember anything.  She has no short-term memory.  Her husband says that she has been doing okay.  She got through the wintertime without any difficulties.  She apparently has had a lesion in the umbilicus.  This is where she had her laparotomy.  She has put tea tree oil on this.  Her husband says they noticed this 2 weeks ago.  It was quite nasty at the time. However, the tea tree oil has helped.  She is still going to go see [name] to make sure that no biopsy is necessary.  She has had no obvious bleeding.  There has been no change in bowel or bladder habits.  She has had no cough.  She has had no fever.  She got through the winter without any infections.  PHYSICAL EXAMINATION:  General:  This is a well-developed, well- nourished white female in no obvious distress.  Vital signs: Temperature 97.7, pulse 54, respiratory rate 16, blood pressure 126/59. Weight is 222.  Head/Neck:  Normocephalic, atraumatic skull.  There are no ocular or oral lesions.  There are no palpable cervical or supraclavicular lymph nodes.  Lungs:  Clear  bilaterally.  Cardiac: Regular rate and rhythm with a normal S1 and S2.  There are no murmurs, rubs, or bruits.  Abdomen:  Soft.  She has a urostomy bag in the __________ region.  Her laparotomy scar is well-healed.  There is a small area of excoriation.  No nodularity is noted in this area.  This area is associated with the umbilicus.  No exudate is noted.  She has good range of motion of her joints.  She does have decrease with the left arm.  Neurological:  Dementia.  Skin:  No ecchymosis or petechia.  LABORATORY STUDIES:  White cell count 3.1, hemoglobin 12.6, hematocrit 37.2, platelet count 66,000.  MCV is 95.  IMPRESSION:  [name] is a very nice 72 year old white female who now has basically pancytopenia.  The leukopenia and thrombocytopenia are from her splenomegaly and cirrhosis.  I forgot to mention that she did have an abdominal ultrasound earlier this year.  This confirmed a splenomegaly.  She has had cirrhosis.  She has portal hypertension.  There is no evidence of hepatic lesions. This, I think is important.  We will go ahead and plan to get her back in another 3 months.  She will need iron at some point in the future.  Her iron levels always drop.  I do not worry about her having any kind of obvious bleeding.  Even though she is thrombocytopenic, her platelets are very quite functional.    ______________________________ [name], M.D. PRE/MEDQ  D:  01/21/2013  T:  01/22/2013  Job:  [id number]

## 2013-02-23 ENCOUNTER — Encounter (INDEPENDENT_AMBULATORY_CARE_PROVIDER_SITE_OTHER): Payer: Self-pay | Admitting: Surgery

## 2013-02-23 ENCOUNTER — Ambulatory Visit (INDEPENDENT_AMBULATORY_CARE_PROVIDER_SITE_OTHER): Payer: Medicare Other | Admitting: Surgery

## 2013-02-23 VITALS — BP 148/78 | HR 60 | Temp 97.7°F | Resp 16 | Ht 65.5 in | Wt 218.6 lb

## 2013-02-23 DIAGNOSIS — L02219 Cutaneous abscess of trunk, unspecified: Secondary | ICD-10-CM

## 2013-02-23 DIAGNOSIS — L02211 Cutaneous abscess of abdominal wall: Secondary | ICD-10-CM

## 2013-02-23 DIAGNOSIS — L03319 Cellulitis of trunk, unspecified: Secondary | ICD-10-CM

## 2013-02-23 MED ORDER — DOXYCYCLINE HYCLATE 100 MG PO TABS: 100.0000 mg | ORAL_TABLET | Freq: Two times a day (BID) | ORAL | Status: DC

## 2013-02-23 NOTE — Progress Notes (Signed)
Subjective:     Patient ID: [name], female   DOB: [birth year], 72 y.o.   MRN: [id number]  HPI This is a patient known to our office with a chronic ventral hernia. She apparently is not an operative candidate. Most recently [name] saw her for a stitch abscess Last month. She has now developed an area of erythema and tenderness at the lower end of her midline incision in a different area. This started approximately a week ago  Review of Systems     Objective:   Physical Exam On exam, the area of the previous stitch abscesses healed. There is a quarter size area of induration and slight fluctuance just to left of the lower midline incision. I prepped this area Betadine and made a small incision with a scalpel after anesthetizing with lidocaine. I found a small cavity but minimal purulence    Assessment:     Abdominal wall abscess     Plan:     Wound care instructions were given. I will start her on doxycycline. I will have her see someone in the office next week.

## 2013-03-04 ENCOUNTER — Ambulatory Visit (INDEPENDENT_AMBULATORY_CARE_PROVIDER_SITE_OTHER): Payer: Medicare Other | Admitting: Surgery

## 2013-03-04 ENCOUNTER — Encounter (INDEPENDENT_AMBULATORY_CARE_PROVIDER_SITE_OTHER): Payer: Self-pay | Admitting: Surgery

## 2013-03-04 VITALS — BP 134/80 | HR 68 | Temp 98.3°F | Resp 15 | Ht 66.0 in | Wt 221.8 lb

## 2013-03-04 DIAGNOSIS — Z09 Encounter for follow-up examination after completed treatment for conditions other than malignant neoplasm: Secondary | ICD-10-CM

## 2013-03-04 NOTE — Progress Notes (Signed)
She had another small stitch abscess drained by Dr Ninfa Linden and comes in for follow up. It area has dramatically improved and she is having only a tiny bit of drainage and no pain or fever.   On exam the area still has some mild induration, but almost closed and no cavity found on probing with sterile cotton tipped applicator.  Imp: resolving stitch abscess Plan: Continue dressing changes, finish antibiotic, see [place] if recurs

## 2013-03-04 NOTE — Patient Instructions (Signed)
Finishe the antibiotic and continue dressing changes as you are doing. If any further problems come back to see me

## 2013-04-25 ENCOUNTER — Other Ambulatory Visit (HOSPITAL_BASED_OUTPATIENT_CLINIC_OR_DEPARTMENT_OTHER): Payer: Medicare Other | Admitting: Lab

## 2013-04-25 ENCOUNTER — Ambulatory Visit (HOSPITAL_BASED_OUTPATIENT_CLINIC_OR_DEPARTMENT_OTHER): Payer: Medicare Other | Admitting: Hematology & Oncology

## 2013-04-25 VITALS — BP 138/55 | HR 54 | Temp 98.1°F | Resp 20 | Wt 224.0 lb

## 2013-04-25 DIAGNOSIS — D509 Iron deficiency anemia, unspecified: Secondary | ICD-10-CM

## 2013-04-25 DIAGNOSIS — Z8551 Personal history of malignant neoplasm of bladder: Secondary | ICD-10-CM

## 2013-04-25 DIAGNOSIS — Z85038 Personal history of other malignant neoplasm of large intestine: Secondary | ICD-10-CM

## 2013-04-25 DIAGNOSIS — D696 Thrombocytopenia, unspecified: Secondary | ICD-10-CM

## 2013-04-25 DIAGNOSIS — K7460 Unspecified cirrhosis of liver: Secondary | ICD-10-CM

## 2013-04-25 DIAGNOSIS — F0390 Unspecified dementia without behavioral disturbance: Secondary | ICD-10-CM

## 2013-04-25 DIAGNOSIS — C189 Malignant neoplasm of colon, unspecified: Secondary | ICD-10-CM

## 2013-04-25 LAB — IRON AND TIBC CHCC
TIBC: 289 ug/dL (ref 236–444)
UIBC: 207 ug/dL (ref 120–384)

## 2013-04-25 LAB — CBC WITH DIFFERENTIAL (CANCER CENTER ONLY)
Eosinophils Absolute: 0.1 10*3/uL (ref 0.0–0.5)
HGB: 12 g/dL (ref 11.6–15.9)
LYMPH%: 26.5 % (ref 14.0–48.0)
MCV: 94 fL (ref 81–101)
MONO#: 0.3 10*3/uL (ref 0.1–0.9)
Platelets: 52 10*3/uL — ABNORMAL LOW (ref 145–400)
RBC: 3.85 10*6/uL (ref 3.70–5.32)
WBC: 2.5 10*3/uL — ABNORMAL LOW (ref 3.9–10.0)

## 2013-04-25 LAB — FERRITIN CHCC: Ferritin: 21 ng/ml (ref 9–269)

## 2013-04-25 LAB — BASIC METABOLIC PANEL
CO2: 31 mEq/L (ref 19–32)
Chloride: 105 mEq/L (ref 96–112)
Creatinine, Ser: 0.68 mg/dL (ref 0.50–1.10)
Sodium: 140 mEq/L (ref 135–145)

## 2013-04-25 LAB — CHCC SATELLITE - SMEAR

## 2013-04-25 LAB — RETICULOCYTES (CHCC): ABS Retic: 66.5 10*3/uL (ref 19.0–186.0)

## 2013-04-25 NOTE — Progress Notes (Signed)
This office note has been dictated.

## 2013-04-26 ENCOUNTER — Telehealth: Payer: Self-pay | Admitting: *Deleted

## 2013-04-26 NOTE — Progress Notes (Signed)
CC:   Tammy R. Modena Morrow, M.D.  DIAGNOSES: 1. Recurrent iron-deficiency anemia. 2. 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 3. Stage II (T3 N0 M0) adenocarcinoma of the colon, remission. 4. 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  CURRENT THERAPY:  IV iron as indicated.  INTERIM HISTORY:  [name] comes in for her followup.  Her dementia continues to be her biggest issue.  She is incredibly pleasant.  She just has no memory that is short term or, for that matter, intermediate term.  She is not having any kind of bleeding issues.  She has had no problems pain-wise.  There has been no leg swelling.  She has had no cough or shortness of breath.  Again, the dementia has been her biggest clinical problem.  When we last saw her in May, her ferritin was 57 with an iron saturation of 42%.  PHYSICAL EXAMINATION:  General:  This is an elderly white female in no obvious distress.  Vital signs:  Temperature of 98.1, pulse 54, respiratory rate 20, blood pressure 138/55.  Weight is 224.  Head and neck:  Normocephalic, atraumatic skull.  There are no ocular or oral lesions.  There are no palpable cervical or supraclavicular lymph nodes. Lungs:  Clear bilaterally.  Cardiac:  Regular rate and rhythm with a normal S1 and S2.  There are no murmurs, rubs or bruits.  Abdomen: Soft.  She has a urostomy bag in the right lower quadrant.  There is no fluid wave.  There is no palpable hepatomegaly.  Spleen tip may be palpable with deep inspiration.  Extremities:  Show no clubbing, cyanosis or edema.  She has decent range motion of her joints.  LABORATORY STUDIES:  White cell count is 2.5, hemoglobin 12, hematocrit 36, platelet count 52,000.  MCV is 94.  IMPRESSION:  [name] is a very charming 72 year old white female with bad dementia.  Her colon cancer and bladder cancer are non-issues from my point of view.  Her  leukopenia and thrombocytopenia are pretty stable.  She does not need any IV iron from my point of view.  We will plan to get her back in another 3 months.  We can get her back sooner if she does have any bleeding issues.   ______________________________ [name], M.D. PRE/MEDQ  D:  04/25/2013  T:  04/26/2013  Job:  [id number]

## 2013-04-26 NOTE — Telephone Encounter (Addendum)
Message copied by Orlando Penner on Tue Apr 26, 2013  4:11 PM ------      Message from: Burney Gauze R      Created: Mon Apr 25, 2013  5:41 PM       Call her husband - her iron is ok!!  Laurey Arrow ------Above message given to pt.  Voiced understanding.  Will tell her husband

## 2013-06-30 ENCOUNTER — Encounter (INDEPENDENT_AMBULATORY_CARE_PROVIDER_SITE_OTHER): Payer: Self-pay | Admitting: General Surgery

## 2013-06-30 ENCOUNTER — Encounter (INDEPENDENT_AMBULATORY_CARE_PROVIDER_SITE_OTHER): Payer: Self-pay

## 2013-06-30 ENCOUNTER — Ambulatory Visit (INDEPENDENT_AMBULATORY_CARE_PROVIDER_SITE_OTHER): Payer: Medicare Other | Admitting: General Surgery

## 2013-06-30 VITALS — BP 130/78 | HR 64 | Temp 97.4°F | Resp 14 | Ht 65.0 in | Wt 226.4 lb

## 2013-06-30 DIAGNOSIS — L0291 Cutaneous abscess, unspecified: Secondary | ICD-10-CM

## 2013-06-30 MED ORDER — CLINDAMYCIN HCL 300 MG PO CAPS: 300.0000 mg | ORAL_CAPSULE | Freq: Three times a day (TID) | ORAL | Status: DC

## 2013-06-30 NOTE — Addendum Note (Signed)
Addended by: Madilyn Hook on: 06/30/2013 04:36 PM   Modules accepted: Orders

## 2013-06-30 NOTE — Progress Notes (Signed)
Subjective:     Patient ID: [name], female   DOB: [birth year], 72 y.o.   MRN: [id number]  HPI This patient is known to our practice for prior evaluation for abdominal wall ventral hernia. She has a history of a colon resection which was complicated by wound dehiscence and the patient tells me that she does have mesh in her abdominal wall. She also has an ileal conduit and an known ventral hernia. She has had prior incision and drainage of a "stitch abscess" and she has a chronic intermittent draining wound near the upper portion of her midline incision which is stable. She has a two-day history of erythema and tenderness near her midline incision.  She denies fevers or chills. She does have a history of cirrhosis and thrombocytopenia  Review of Systems     Objective:   Physical Exam She has some erythema in tenderness and bruising of the lower portion of her midline wound with a small fluctuant area.  I performed ultrasound at the bedside in the appears that there is a small fluid collection in this area. I also asked Dr. Lucia Gaskins to look at the area as well and we feel that incision and drainage would likely be required.      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 She appears to acute infection on top of this as well. The unfortunate thing is that a notch or the she will be suitable surgical candidate to remove her mesh and fix her ventral hernia. She does have thrombocytopenia but I think that her platelets at 52 should be okay for minor procedure such as a small incision and drainage.  Risks of the procedure discussed and informed consent obtained.  I prepped the area with chlorhexidine solution. I injected the area with 1% lidocaine with epinephrine and aspirated some clear fluid from this fluctuant area. A 1 cm incision was made and the wound was explored.  She had a within the depths of the wound some slimy purulent type material but no obvious abscess cavity. The wound was hemostatic and I packed the wound with quarter inch Nu Gauze and dressing was applied there were no apparent complications.  The husband says that he has contacted her wound previously and feels comfortable doing the wound care. He will pack the wound and we can see her back in about one week for repeat evaluation. We will give her a week of antibiotics as well.    Plan:     Clindamycin Follow up in 1 week or

## 2013-07-25 ENCOUNTER — Ambulatory Visit (HOSPITAL_BASED_OUTPATIENT_CLINIC_OR_DEPARTMENT_OTHER): Payer: Medicare Other | Admitting: Hematology & Oncology

## 2013-07-25 ENCOUNTER — Other Ambulatory Visit (HOSPITAL_BASED_OUTPATIENT_CLINIC_OR_DEPARTMENT_OTHER): Payer: Medicare Other | Admitting: Lab

## 2013-07-25 VITALS — BP 136/55 | HR 76 | Temp 97.7°F | Resp 16 | Ht 65.0 in | Wt 228.0 lb

## 2013-07-25 DIAGNOSIS — Z8551 Personal history of malignant neoplasm of bladder: Secondary | ICD-10-CM

## 2013-07-25 DIAGNOSIS — K7460 Unspecified cirrhosis of liver: Secondary | ICD-10-CM

## 2013-07-25 DIAGNOSIS — D696 Thrombocytopenia, unspecified: Secondary | ICD-10-CM

## 2013-07-25 DIAGNOSIS — D72819 Decreased white blood cell count, unspecified: Secondary | ICD-10-CM

## 2013-07-25 DIAGNOSIS — Z85038 Personal history of other malignant neoplasm of large intestine: Secondary | ICD-10-CM

## 2013-07-25 DIAGNOSIS — D509 Iron deficiency anemia, unspecified: Secondary | ICD-10-CM

## 2013-07-25 LAB — CBC WITH DIFFERENTIAL (CANCER CENTER ONLY)
BASO%: 0.3 % (ref 0.0–2.0)
EOS%: 1.3 % (ref 0.0–7.0)
HCT: 37.1 % (ref 34.8–46.6)
LYMPH%: 20.7 % (ref 14.0–48.0)
MCH: 30.3 pg (ref 26.0–34.0)
MCHC: 33.4 g/dL (ref 32.0–36.0)
MCV: 91 fL (ref 81–101)
MONO#: 0.3 10*3/uL (ref 0.1–0.9)
MONO%: 8.9 % (ref 0.0–13.0)
NEUT%: 68.8 % (ref 39.6–80.0)
Platelets: 52 10*3/uL — ABNORMAL LOW (ref 145–400)
RDW: 16.1 % — ABNORMAL HIGH (ref 11.1–15.7)

## 2013-07-25 LAB — RETICULOCYTES (CHCC)
ABS Retic: 76.9 10*3/uL (ref 19.0–186.0)
RBC.: 4.27 MIL/uL (ref 3.87–5.11)
Retic Ct Pct: 1.8 % (ref 0.4–2.3)

## 2013-07-25 LAB — IRON AND TIBC CHCC: %SAT: 28 % (ref 21–57)

## 2013-07-25 NOTE — Progress Notes (Signed)
This office note has been dictated.

## 2013-07-26 NOTE — Progress Notes (Signed)
CC:   [name], M.D.  DIAGNOSES: 1. Recurrent iron-deficiency anemia. 2. Nonalcoholic cirrhosis/portal hypertension. 3. Chronic leukopenia/thrombocytopenia. 4. Stage II adenocarcinoma of the colon -- remission. 5. Stage IV (T3, N1, M0) transitional cell carcinoma of the bladder --     remission.  CURRENT THERAPY:  IV iron as indicated.  INTERIM HISTORY:  [name] comes in for followup.  She is actually doing fairly well.  The big problem with her is the dementia that she has.  She apparently had some abdominal lesion opened up recently.  This was done by the surgeon.  She sees Dr. Osborn Coho.  She does have an incisional hernia.  He does not think that she would be a good candidate for any type of operative intervention secondary to the chronic thrombocytopenia and leukopenia.  She recently had a stitch abscess.  This was opened up and is closing in gradually.  She has had no problems with bleeding.  There has been no change in bowel or bladder habits.  When we last saw her in August, her ferritin was 20 with an iron saturation of 28%.  She has had no problems with her urostomy bag.  There has been no cough. She has had no fever.  She has had no rashes.  There has been no real nausea or vomiting.  Of note, her last iron was given back in January of this year.  PHYSICAL EXAMINATION:  General:  This is a well-developed, well- nourished white female, in no obvious distress.  Vital Signs: Temperature of 97.7, pulse 76, respiratory rate 16, blood pressure 136/55.  Weight is 228 pounds.  Head and neck:  Shows a normocephalic, atraumatic skull.  There are no ocular or oral lesions.  There are no palpable cervical or supraclavicular lymph nodes.  Lungs:  Clear bilaterally.  Cardiac:  Regular rate and rhythm with normal S1, S2.  She has no murmurs, rubs, or bruits.  Abdomen:  Soft.  She has laparotomy scars.  She has a urostomy bag in the right lower quadrant.  She has  a closing midline hypo-umbilical wound that is non-erythematous. Extremities:  Show no clubbing, cyanosis, or edema.  Her strength is 4+/5 bilaterally.  Neurologic:  No focal neurological deficits outside of the dementia.  Skin:  No rashes, ecchymosis, or petechia.  LABORATORY STUDIES:  White cell count is 3, hemoglobin 12.4, hematocrit 37.1, platelet count 52,000.  MCV is 91.  IMPRESSION:  [name] is a 72 year old white female with multiple issues.  __________ right now is the leukopenia and thrombocytopenia. These are holding pretty steady.  I did look at her blood smear.  I really did not see any changes with the blood smear.  Her platelets are well granulated.  White cells are mature.  There are no nucleated red blood cells.  It is possible that she may need iron.  We will see what her iron studies show.  Even though she is not anemic, iron may not be bad I think for her with the holidays coming up.  I do want to see her back in about 3 months' time now.  I reviewed all of her lab work with her and her husband.    ______________________________ [name], M.D. PRE/MEDQ  D:  07/25/2013  T:  07/26/2013  Job:  [id number]

## 2013-07-28 ENCOUNTER — Telehealth: Payer: Self-pay | Admitting: *Deleted

## 2013-07-28 NOTE — Telephone Encounter (Signed)
Called patient husband to let him know that patients iron levels looked good per d.r ennever

## 2013-07-28 NOTE — Telephone Encounter (Signed)
Message copied by Rico Ala on Thu Jul 28, 2013 12:02 PM ------      Message from: Burney Gauze R      Created: Mon Jul 25, 2013  5:13 PM       Call husband - iron is still good!! Film/video editor ------

## 2013-10-04 DIAGNOSIS — H9209 Otalgia, unspecified ear: Secondary | ICD-10-CM | POA: Diagnosis not present

## 2013-10-24 ENCOUNTER — Other Ambulatory Visit (HOSPITAL_BASED_OUTPATIENT_CLINIC_OR_DEPARTMENT_OTHER): Payer: Medicare Other | Admitting: Lab

## 2013-10-24 ENCOUNTER — Encounter: Payer: Self-pay | Admitting: Hematology & Oncology

## 2013-10-24 ENCOUNTER — Ambulatory Visit (HOSPITAL_BASED_OUTPATIENT_CLINIC_OR_DEPARTMENT_OTHER): Payer: Medicare Other | Admitting: Hematology & Oncology

## 2013-10-24 VITALS — BP 137/67 | HR 55 | Temp 97.9°F | Resp 55 | Ht 66.0 in | Wt 227.0 lb

## 2013-10-24 DIAGNOSIS — D6959 Other secondary thrombocytopenia: Secondary | ICD-10-CM | POA: Diagnosis not present

## 2013-10-24 DIAGNOSIS — Z85038 Personal history of other malignant neoplasm of large intestine: Secondary | ICD-10-CM

## 2013-10-24 DIAGNOSIS — K7689 Other specified diseases of liver: Secondary | ICD-10-CM

## 2013-10-24 DIAGNOSIS — D696 Thrombocytopenia, unspecified: Secondary | ICD-10-CM

## 2013-10-24 DIAGNOSIS — D509 Iron deficiency anemia, unspecified: Secondary | ICD-10-CM | POA: Diagnosis not present

## 2013-10-24 DIAGNOSIS — K7460 Unspecified cirrhosis of liver: Secondary | ICD-10-CM

## 2013-10-24 DIAGNOSIS — D72819 Decreased white blood cell count, unspecified: Secondary | ICD-10-CM

## 2013-10-24 LAB — CBC WITH DIFFERENTIAL (CANCER CENTER ONLY)
BASO#: 0 10*3/uL (ref 0.0–0.2)
BASO%: 0.3 % (ref 0.0–2.0)
EOS%: 2.6 % (ref 0.0–7.0)
Eosinophils Absolute: 0.1 10*3/uL (ref 0.0–0.5)
HEMATOCRIT: 39 % (ref 34.8–46.6)
HGB: 13.1 g/dL (ref 11.6–15.9)
LYMPH#: 0.7 10*3/uL — ABNORMAL LOW (ref 0.9–3.3)
LYMPH%: 23 % (ref 14.0–48.0)
MCH: 30.9 pg (ref 26.0–34.0)
MCHC: 33.6 g/dL (ref 32.0–36.0)
MCV: 92 fL (ref 81–101)
MONO#: 0.3 10*3/uL (ref 0.1–0.9)
MONO%: 10.5 % (ref 0.0–13.0)
NEUT#: 1.9 10*3/uL (ref 1.5–6.5)
NEUT%: 63.6 % (ref 39.6–80.0)
Platelets: 63 10*3/uL — ABNORMAL LOW (ref 145–400)
RBC: 4.24 10*6/uL (ref 3.70–5.32)
RDW: 16 % — AB (ref 11.1–15.7)
WBC: 3.1 10*3/uL — ABNORMAL LOW (ref 3.9–10.0)

## 2013-10-24 LAB — IRON AND TIBC CHCC
%SAT: 50 % (ref 21–57)
IRON: 144 ug/dL — AB (ref 41–142)
TIBC: 288 ug/dL (ref 236–444)
UIBC: 143 ug/dL (ref 120–384)

## 2013-10-24 LAB — FERRITIN CHCC: Ferritin: 29 ng/ml (ref 9–269)

## 2013-10-24 LAB — CHCC SATELLITE - SMEAR

## 2013-10-24 NOTE — Progress Notes (Signed)
This office note has been dictated.

## 2013-10-25 NOTE — Progress Notes (Signed)
CC:   [name], M.D.  DIAGNOSES: 1. Recurrent iron-deficiency anemia. 2. 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 3. Remote history of stage II (T3, N0, M0) adenocarcinoma of colon. 4. 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  CURRENT THERAPY:  IV iron as indicated.  INTERIM HISTORY:  [name] comes in for a followup.  She is doing fairly well.  She looks quite good.  She had a nice Christmas Holiday and Thanksgiving Holiday.  She does have dementia, so she really cannot remember all that much.  I think she last got iron back in August.  In November, her ferritin was 31 with an iron saturation of 28%.  She has not noted any obvious bleeding.  Her husband, who always comes with her, has not noted anything unusual.  Of note, actually, her last iron was given back in January of 2014.  She has a urostomy bag, but this is working well.  She is not complaining of any kind of pain.  She does have abdominal hernia, but this does not need to be fixed.  PHYSICAL EXAMINATION:  General:  This is an elderly-appearing white female, in no obvious distress.  Vital Signs:  Temperature of 97.9, pulse 55, respiratory rate 14, blood pressure 137/67.  Weight is 227 pounds.  Head and Neck:  Normocephalic, atraumatic skull.  There is no scleral icterus.  There is no ocular or oral lesions.  There are no palpable cervical or supraclavicular lymph nodes.  Lungs:  Clear bilaterally.  Cardiac:  Regular rate and rhythm with normal S1 and S2. There are no murmurs, rubs, or bruits.  Abdomen:  Soft.  She has good bowel sounds.  There is no fluid wave.  There is no palpable hepatosplenomegaly.  Her colostomy bag is intact.  Extremities:  No clubbing, cyanosis, or edema.  She has decreased range of motion in most of her joints.  Skin:  Some scattered ecchymoses.  LABORATORY STUDIES:  White cell  count 3.1, hemoglobin 13, hematocrit 39, platelet count 63,000.  MCV is 92.  IMPRESSION:  [name] is a very charming 73 year old white female. She has recurrent iron deficiency anemia.  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  So far, her blood count is __________ stable.  I would think that would be unlikely that her iron was low.  The leukopenia and thrombocytopenia are also stable.  We will plan to get her back in 4 and 3 months.  I do not see that we need to do any x-rays or labs in between visits.    ______________________________ [name], M.D. PRE/MEDQ  D:  10/24/2013  T:  10/25/2013  Job:  [id number]

## 2013-11-14 DIAGNOSIS — R05 Cough: Secondary | ICD-10-CM | POA: Diagnosis not present

## 2013-11-14 DIAGNOSIS — R059 Cough, unspecified: Secondary | ICD-10-CM | POA: Diagnosis not present

## 2013-11-15 ENCOUNTER — Encounter (HOSPITAL_COMMUNITY): Payer: Self-pay | Admitting: Emergency Medicine

## 2013-11-15 ENCOUNTER — Emergency Department (HOSPITAL_COMMUNITY): Payer: Medicare Other

## 2013-11-15 ENCOUNTER — Emergency Department (HOSPITAL_COMMUNITY)
Admission: EM | Admit: 2013-11-15 | Discharge: 2013-11-15 | Disposition: A | Discharge status: Home / Self Care | Payer: Medicare Other | Attending: Emergency Medicine | Admitting: Emergency Medicine

## 2013-11-15 DIAGNOSIS — R05 Cough: Secondary | ICD-10-CM

## 2013-11-15 DIAGNOSIS — Z860100 Personal history of colon polyps, unspecified: Secondary | ICD-10-CM | POA: Insufficient documentation

## 2013-11-15 DIAGNOSIS — Z862 Personal history of diseases of the blood and blood-forming organs and certain disorders involving the immune mechanism: Secondary | ICD-10-CM | POA: Insufficient documentation

## 2013-11-15 DIAGNOSIS — R413 Other amnesia: Secondary | ICD-10-CM | POA: Diagnosis not present

## 2013-11-15 DIAGNOSIS — Z86718 Personal history of other venous thrombosis and embolism: Secondary | ICD-10-CM | POA: Insufficient documentation

## 2013-11-15 DIAGNOSIS — J189 Pneumonia, unspecified organism: Secondary | ICD-10-CM | POA: Insufficient documentation

## 2013-11-15 DIAGNOSIS — Z8551 Personal history of malignant neoplasm of bladder: Secondary | ICD-10-CM | POA: Insufficient documentation

## 2013-11-15 DIAGNOSIS — H269 Unspecified cataract: Secondary | ICD-10-CM | POA: Insufficient documentation

## 2013-11-15 DIAGNOSIS — Z85038 Personal history of other malignant neoplasm of large intestine: Secondary | ICD-10-CM | POA: Insufficient documentation

## 2013-11-15 DIAGNOSIS — Z8719 Personal history of other diseases of the digestive system: Secondary | ICD-10-CM | POA: Insufficient documentation

## 2013-11-15 DIAGNOSIS — M129 Arthropathy, unspecified: Secondary | ICD-10-CM | POA: Insufficient documentation

## 2013-11-15 DIAGNOSIS — Z87891 Personal history of nicotine dependence: Secondary | ICD-10-CM | POA: Diagnosis not present

## 2013-11-15 DIAGNOSIS — IMO0002 Reserved for concepts with insufficient information to code with codable children: Secondary | ICD-10-CM | POA: Insufficient documentation

## 2013-11-15 DIAGNOSIS — F329 Major depressive disorder, single episode, unspecified: Secondary | ICD-10-CM | POA: Diagnosis not present

## 2013-11-15 DIAGNOSIS — R059 Cough, unspecified: Secondary | ICD-10-CM | POA: Diagnosis not present

## 2013-11-15 DIAGNOSIS — F3289 Other specified depressive episodes: Secondary | ICD-10-CM | POA: Insufficient documentation

## 2013-11-15 DIAGNOSIS — Z9889 Other specified postprocedural states: Secondary | ICD-10-CM | POA: Diagnosis not present

## 2013-11-15 DIAGNOSIS — R197 Diarrhea, unspecified: Secondary | ICD-10-CM | POA: Diagnosis not present

## 2013-11-15 DIAGNOSIS — M810 Age-related osteoporosis without current pathological fracture: Secondary | ICD-10-CM | POA: Diagnosis not present

## 2013-11-15 DIAGNOSIS — Z79899 Other long term (current) drug therapy: Secondary | ICD-10-CM | POA: Insufficient documentation

## 2013-11-15 DIAGNOSIS — I10 Essential (primary) hypertension: Secondary | ICD-10-CM | POA: Insufficient documentation

## 2013-11-15 DIAGNOSIS — Z880 Allergy status to penicillin: Secondary | ICD-10-CM | POA: Insufficient documentation

## 2013-11-15 LAB — COMPREHENSIVE METABOLIC PANEL
ALBUMIN: 3 g/dL — AB (ref 3.5–5.2)
ALT: 21 U/L (ref 0–35)
AST: 39 U/L — ABNORMAL HIGH (ref 0–37)
Alkaline Phosphatase: 93 U/L (ref 39–117)
BILIRUBIN TOTAL: 1.4 mg/dL — AB (ref 0.3–1.2)
BUN: 8 mg/dL (ref 6–23)
CO2: 27 mEq/L (ref 19–32)
CREATININE: 0.68 mg/dL (ref 0.50–1.10)
Calcium: 8.9 mg/dL (ref 8.4–10.5)
Chloride: 100 mEq/L (ref 96–112)
GFR calc non Af Amer: 84 mL/min — ABNORMAL LOW (ref >90)
Glucose, Bld: 133 mg/dL — ABNORMAL HIGH (ref 70–99)
Potassium: 3.5 mEq/L — ABNORMAL LOW (ref 3.7–5.3)
Sodium: 138 mEq/L (ref 137–147)
Total Protein: 6 g/dL (ref 6.0–8.3)

## 2013-11-15 LAB — I-STAT CHEM 8, ED
BUN: 6 mg/dL (ref 6–23)
CREATININE: 0.8 mg/dL (ref 0.50–1.10)
Calcium, Ion: 1.2 mmol/L (ref 1.13–1.30)
Chloride: 101 mEq/L (ref 96–112)
GLUCOSE: 124 mg/dL — AB (ref 70–99)
HCT: 36 % (ref 36.0–46.0)
HEMOGLOBIN: 12.2 g/dL (ref 12.0–15.0)
POTASSIUM: 3.4 meq/L — AB (ref 3.7–5.3)
Sodium: 141 mEq/L (ref 137–147)
TCO2: 26 mmol/L (ref 0–100)

## 2013-11-15 LAB — CBC
HEMATOCRIT: 35.5 % — AB (ref 36.0–46.0)
HEMOGLOBIN: 12.1 g/dL (ref 12.0–15.0)
MCH: 30.9 pg (ref 26.0–34.0)
MCHC: 34.1 g/dL (ref 30.0–36.0)
MCV: 90.8 fL (ref 78.0–100.0)
Platelets: 51 10*3/uL — ABNORMAL LOW (ref 150–400)
RBC: 3.91 MIL/uL (ref 3.87–5.11)
RDW: 16.2 % — ABNORMAL HIGH (ref 11.5–15.5)
WBC: 2.6 10*3/uL — ABNORMAL LOW (ref 4.0–10.5)

## 2013-11-15 MED ORDER — ALBUTEROL SULFATE HFA 108 (90 BASE) MCG/ACT IN AERS
2.0000 | INHALATION_SPRAY | RESPIRATORY_TRACT | Status: DC | PRN
  Administered 2013-11-15: 2 via RESPIRATORY_TRACT
  Filled 2013-11-15: qty 6.7

## 2013-11-15 MED ORDER — HYDROCODONE-ACETAMINOPHEN 7.5-325 MG/15ML PO SOLN
10.0000 mL | Freq: Once | ORAL | Status: AC
  Administered 2013-11-15: 10 mL via ORAL
  Filled 2013-11-15: qty 15

## 2013-11-15 MED ORDER — ALBUTEROL SULFATE (2.5 MG/3ML) 0.083% IN NEBU
5.0000 mg | INHALATION_SOLUTION | Freq: Once | RESPIRATORY_TRACT | Status: AC
  Administered 2013-11-15: 5 mg via RESPIRATORY_TRACT
  Filled 2013-11-15: qty 6

## 2013-11-15 MED ORDER — GUAIFENESIN 100 MG/5ML PO LIQD: 100.0000 mg | ORAL | Status: DC | PRN

## 2013-11-15 MED ORDER — POTASSIUM CHLORIDE CRYS ER 20 MEQ PO TBCR
40.0000 meq | EXTENDED_RELEASE_TABLET | Freq: Once | ORAL | Status: AC
  Administered 2013-11-15: 40 meq via ORAL
  Filled 2013-11-15: qty 2

## 2013-11-15 MED ORDER — SODIUM CHLORIDE 0.9 % IV BOLUS (SEPSIS): 1000.0000 mL | Freq: Once | INTRAVENOUS | Status: DC

## 2013-11-15 NOTE — ED Provider Notes (Signed)
CSN: [id number]     Arrival date & time 11/15/13  0443 History   First MD Initiated Contact with Patient 11/15/13 0543     Chief Complaint  Patient presents with  . Cough     (Consider location/radiation/quality/duration/timing/severity/associated sxs/prior Treatment) HPI History provided by patient and husband. Ongoing hacking dry cough for the last few days, evaluated by primary care physician yesterday who prescribed azithromycin and Tessalon Perles. Has taken second dose of antibiotics and today developed diarrhea multiple episodes. No blood in stools. No nausea vomiting. Persistent cough despite medications. No fevers or chills. Unable to sleep due to symptoms. Symptoms moderate severity. No sick contacts  Past Medical History  Diagnosis Date  . Cirrhosis 08/26/2011  . Colon cancer 2001  . Bladder cancer 2003  . Memory loss     d/t chemotherapy;short and long term;takes Aricept daily  . Thrombocytopenia   . Osteoporosis     takes Vit D3 daily  . Blood transfusion   . Arthritis   . Complication of anesthesia     confusion x 2 to 3 days  . Hypertension     takes Ramipril and Metoprolol daily  . History of blood clots 50+yrs ago    legs when she was pregnancy  . Headache(784.0)     occasionally  . Joint pain   . Joint swelling   . GERD (gastroesophageal reflux disease)     takes Protonix daily  . History of GI bleed   . Constipation   . Hemorrhoids   . History of colon polyps   . Anemia, iron deficiency     iron injection about 6wks ago  . Cataract immature   . Depression     takes Celexa daily  . Attention to urostomy     urostomy d/t hx bladder cancer  . Blood dyscrasia     thromboctopenia   Past Surgical History  Procedure Laterality Date  . Colon surgery  2004  . Total knee arthroplasty  2010    Left knee  . Colon surgery  2001  . Colostomy    . Bladder surgery      bladder removed d/t cancer  . Esophagogastroduodenoscopy  02/27/2012    Procedure:  ESOPHAGOGASTRODUODENOSCOPY (EGD);  Surgeon: [name], MD;  Location: [name];  Service: Endoscopy;  Laterality: N/A;  bedside case  . Appendectomy    . Abdominal hysterectomy    . Cholecystectomy    . Colonoscopy     Family History  Problem Relation Age of Onset  . Diabetes Brother   . Cancer Other   . Hypertension Other    History  Substance Use Topics  . Smoking status: Former Smoker -- 0.50 packs/day for 35 years    Types: Cigarettes    Start date: 10/25/1955    Quit date: 09/22/1991  . Smokeless tobacco: Never Used     Comment: quit 23years ago  . Alcohol Use: No   OB History   Grav Para Term Preterm Abortions TAB SAB Ect Mult Living                 Review of Systems  Constitutional: Negative for fever and chills.  Respiratory: Positive for cough. Negative for shortness of breath.   Cardiovascular: Negative for chest pain.  Gastrointestinal: Positive for diarrhea. Negative for abdominal pain.  Genitourinary: Negative for dysuria.  Musculoskeletal: Negative for back pain, neck pain and neck stiffness.  Skin: Negative for rash.  Neurological: Negative for headaches.  All other  systems reviewed and are negative.      Allergies  Codeine; Morphine and related; Other; Promethazine hcl; and Penicillins  Home Medications   Current Outpatient Rx  Name  Route  Sig  Dispense  Refill  . Cholecalciferol (VITAMIN D3) 1000 UNITS CAPS   Oral   Take 1 capsule by mouth daily.           . Cinnamon 500 MG capsule   Oral   Take 500 mg by mouth daily.          . citalopram (CELEXA) 40 MG tablet   Oral   Take 20 mg by mouth daily.         . Cyanocobalamin (VITAMIN B 12 PO)   Oral   Take by mouth every morning.         . fish oil-omega-3 fatty acids 1000 MG capsule   Oral   Take 2 g by mouth daily.           . fluticasone (FLONASE) 50 MCG/ACT nasal spray   Each Nare   Place 1 spray into both nostrils at bedtime.          . Ginkgo 60 MG TABS    Oral   Take 60 mg by mouth every morning.         . Multiple Vitamin (MULTI VITAMIN DAILY PO)   Oral   Take by mouth every morning.         . nadolol (CORGARD) 20 MG tablet   Oral   Take 10 mg by mouth daily.         . ramipril (ALTACE) 5 MG capsule   Oral   Take 5 mg by mouth Daily.          BP 154/75  Pulse 72  Temp(Src) 99.2 F (37.3 C) (Oral)  Resp 24  Ht 5' 5"  (1.651 m)  Wt 228 lb (103.42 kg)  BMI 37.94 kg/m2  SpO2 95% Physical Exam  Constitutional: She is oriented to person, place, and time. She appears well-developed and well-nourished.  HENT:  Head: Normocephalic and atraumatic.  Eyes: EOM are normal. Pupils are equal, round, and reactive to light. No scleral icterus.  Neck: Neck supple.  Cardiovascular: Normal rate, regular rhythm and intact distal pulses.   Pulmonary/Chest: Effort normal and breath sounds normal. No respiratory distress. She exhibits no tenderness.  Abdominal: Soft. Bowel sounds are normal. She exhibits no distension. There is no tenderness.  Musculoskeletal: Normal range of motion. She exhibits no edema.  Neurological: She is alert and oriented to person, place, and time.  Skin: Skin is warm and dry.    ED Course  Procedures (including critical care time) Labs Review Labs Reviewed  COMPREHENSIVE METABOLIC PANEL - Abnormal; Notable for the following:    Potassium 3.5 (*)    Glucose, Bld 133 (*)    Albumin 3.0 (*)    AST 39 (*)    Total Bilirubin 1.4 (*)    GFR calc non Af Amer 84 (*)    All other components within normal limits  I-STAT CHEM 8, ED - Abnormal; Notable for the following:    Potassium 3.4 (*)    Glucose, Bld 124 (*)    All other components within normal limits  CBC   Imaging Review Dg Chest 2 View  11/15/2013   CLINICAL DATA:  Cough and congestion for 2-3 days.  EXAM: CHEST  2 VIEW  COMPARISON:  CT SHOULDER*L* W/O CM dated 03/23/2012  FINDINGS:  The Cardiac silhouette is unremarkable, mildly calcified aortic knob.  Mediastinal silhouette nonsuspicious. No pleural effusions or focal consolidations. Minimal linear densities at right lung base may reflect atelectasis or scarring. No pneumothorax. Mild degenerative change of thoracic spine. Surgical clips in the abdomen likely reflect cholecystectomy. Remote left humeral surgical neck fracture.  IMPRESSION: Minimal right lower lobe atelectasis versus scarring.   Electronically Signed   By: Elon Alas   On: 11/15/2013 05:56    Lortab elixir. Albuterol treatment. PO potassium.  6:46 AM on recheck, breathing feels better - cough improved. Lungs clear bilaterally. Patient requesting to be discharged home. Her repeat pulse ox is 97% room air. She has ambulated to the bathroom without dyspnea. She wants to be discharged home. She will continue antibiotics as prescribed. She'll stop Tessalon Perles and take Lortab elixir as needed at nighttime for cough. Albuterol inhaler provided with instructions. Plan close outpatient followup primary care physician  MDM   Diagnosis: Cough, respiratory infection - viral versus bacterial.  She is currently being treated for pneumonia - started antibiotics yesterday. Presents for persistent coughing. Improved with albuterol and Lortab. No hypoxia, fever or leukocytosis. Chest x-ray reviewed as above. Vital signs and nursing notes reviewed and considered.    Teressa Lower, MD 11/15/13 [id number]

## 2013-11-15 NOTE — Discharge Instructions (Signed)
Cool Mist Vaporizers Vaporizers may help relieve the symptoms of a cough and cold. They add moisture to the air, which helps mucus to become thinner and less sticky. This makes it easier to breathe and cough up secretions. Cool mist vaporizers do not cause serious burns like hot mist vaporizers ("steamers, humidifiers"). Vaporizers have not been proved to show they help with colds. You should not use a vaporizer if you are allergic to mold.  HOME CARE INSTRUCTIONS  Follow the package instructions for the vaporizer.  Do not use anything other than distilled water in the vaporizer.  Do not run the vaporizer all of the time. This can cause mold or bacteria to grow in the vaporizer.  Clean the vaporizer after each time it is used.  Clean and dry the vaporizer well before storing it.  Stop using the vaporizer if worsening respiratory symptoms develop. Document Released: 06/05/2004 Document Revised: 05/11/2013 Document Reviewed: 01/26/2013 [facility] Patient Information 2014 [place], [place].  Cough, Adult  A cough is a reflex that helps clear your throat and airways. It can help heal the body or may be a reaction to an irritated airway. A cough may only last 2 or 3 weeks (acute) or may last more than 8 weeks (chronic).  CAUSES Acute cough:  Viral or bacterial infections. Chronic cough:  Infections.  Allergies.  Asthma.  Post-nasal drip.  Smoking.  Heartburn or acid reflux.  Some medicines.  Chronic lung problems (COPD).  Cancer. SYMPTOMS   Cough.  Fever.  Chest pain.  Increased breathing rate.  High-pitched whistling sound when breathing (wheezing).  Colored mucus that you cough up (sputum). TREATMENT   A bacterial cough may be treated with antibiotic medicine.  A viral cough must run its course and will not respond to antibiotics.  Your caregiver may recommend other treatments if you have a chronic cough. HOME CARE INSTRUCTIONS   Only take over-the-counter or  prescription medicines for pain, discomfort, or fever as directed by your caregiver. Use cough suppressants only as directed by your caregiver.  Use a cold steam vaporizer or humidifier in your bedroom or home to help loosen secretions.  Sleep in a semi-upright position if your cough is worse at night.  Rest as needed.  Stop smoking if you smoke. SEEK IMMEDIATE MEDICAL CARE IF:   You have pus in your sputum.  Your cough starts to worsen.  You cannot control your cough with suppressants and are losing sleep.  You begin coughing up blood.  You have difficulty breathing.  You develop pain which is getting worse or is uncontrolled with medicine.  You have a fever. MAKE SURE YOU:   Understand these instructions.  Will watch your condition.  Will get help right away if you are not doing well or get worse. Document Released: 03/07/2011 Document Revised: 12/01/2011 Document Reviewed: 03/07/2011 [facility] Patient Information 2014 [place].

## 2013-11-15 NOTE — ED Notes (Signed)
Pt has had a cough since Saturday she was seen by PCP on 2/23 and given azithromycin and tessalon pearls however the cough has gotten worse.  Pt is c/o diarrhea x 5 episodes after taking antibiotic.

## 2013-11-17 ENCOUNTER — Emergency Department (HOSPITAL_COMMUNITY): Payer: Medicare Other

## 2013-11-17 ENCOUNTER — Encounter (HOSPITAL_COMMUNITY): Payer: Self-pay | Admitting: Emergency Medicine

## 2013-11-17 ENCOUNTER — Emergency Department (HOSPITAL_COMMUNITY)
Admission: EM | Admit: 2013-11-17 | Discharge: 2013-11-17 | Disposition: A | Discharge status: Home / Self Care | Payer: Medicare Other | Attending: Emergency Medicine | Admitting: Emergency Medicine

## 2013-11-17 DIAGNOSIS — K219 Gastro-esophageal reflux disease without esophagitis: Secondary | ICD-10-CM | POA: Insufficient documentation

## 2013-11-17 DIAGNOSIS — Z87891 Personal history of nicotine dependence: Secondary | ICD-10-CM | POA: Insufficient documentation

## 2013-11-17 DIAGNOSIS — Z8551 Personal history of malignant neoplasm of bladder: Secondary | ICD-10-CM | POA: Diagnosis not present

## 2013-11-17 DIAGNOSIS — Z79899 Other long term (current) drug therapy: Secondary | ICD-10-CM | POA: Diagnosis not present

## 2013-11-17 DIAGNOSIS — I10 Essential (primary) hypertension: Secondary | ICD-10-CM | POA: Diagnosis not present

## 2013-11-17 DIAGNOSIS — F3289 Other specified depressive episodes: Secondary | ICD-10-CM | POA: Insufficient documentation

## 2013-11-17 DIAGNOSIS — Z862 Personal history of diseases of the blood and blood-forming organs and certain disorders involving the immune mechanism: Secondary | ICD-10-CM | POA: Insufficient documentation

## 2013-11-17 DIAGNOSIS — Z860100 Personal history of colon polyps, unspecified: Secondary | ICD-10-CM | POA: Insufficient documentation

## 2013-11-17 DIAGNOSIS — Z8739 Personal history of other diseases of the musculoskeletal system and connective tissue: Secondary | ICD-10-CM | POA: Diagnosis not present

## 2013-11-17 DIAGNOSIS — IMO0002 Reserved for concepts with insufficient information to code with codable children: Secondary | ICD-10-CM | POA: Diagnosis not present

## 2013-11-17 DIAGNOSIS — Z880 Allergy status to penicillin: Secondary | ICD-10-CM | POA: Insufficient documentation

## 2013-11-17 DIAGNOSIS — R05 Cough: Secondary | ICD-10-CM | POA: Diagnosis not present

## 2013-11-17 DIAGNOSIS — J984 Other disorders of lung: Secondary | ICD-10-CM | POA: Diagnosis not present

## 2013-11-17 DIAGNOSIS — R059 Cough, unspecified: Secondary | ICD-10-CM | POA: Diagnosis not present

## 2013-11-17 DIAGNOSIS — Z8669 Personal history of other diseases of the nervous system and sense organs: Secondary | ICD-10-CM | POA: Insufficient documentation

## 2013-11-17 DIAGNOSIS — Z85038 Personal history of other malignant neoplasm of large intestine: Secondary | ICD-10-CM | POA: Diagnosis not present

## 2013-11-17 DIAGNOSIS — F329 Major depressive disorder, single episode, unspecified: Secondary | ICD-10-CM | POA: Diagnosis not present

## 2013-11-17 MED ORDER — ALBUTEROL SULFATE HFA 108 (90 BASE) MCG/ACT IN AERS: 1.0000 | INHALATION_SPRAY | RESPIRATORY_TRACT | Status: DC

## 2013-11-17 MED ORDER — ALBUTEROL SULFATE (2.5 MG/3ML) 0.083% IN NEBU
5.0000 mg | INHALATION_SOLUTION | Freq: Once | RESPIRATORY_TRACT | Status: AC
  Administered 2013-11-17: 5 mg via RESPIRATORY_TRACT
  Filled 2013-11-17: qty 6

## 2013-11-17 MED ORDER — PREDNISONE 20 MG PO TABS: 40.0000 mg | ORAL_TABLET | Freq: Every day | ORAL | Status: AC

## 2013-11-17 MED ORDER — PREDNISONE 20 MG PO TABS
60.0000 mg | ORAL_TABLET | ORAL | Status: AC
  Administered 2013-11-17: 60 mg via ORAL
  Filled 2013-11-17: qty 3

## 2013-11-17 NOTE — ED Notes (Signed)
Pts husband states they went to PCP on Monday for cough, congestion, was placed on Z-pack, has been taking as prescribed, came to ED Tuesday morning d/t increased cough, was given cough medication and sent home, husband states pt had fever last night and took tylenol for fever, they state pt just felt warm did not actually take temperature, today they deny pt having fever. Pt sitting in chair in no distress, d/t memory problems pt cannot tell if she feels better or not.

## 2013-11-17 NOTE — ED Notes (Signed)
Pt states that she was here 2 days ago for the same thing.  States that she cannot stop coughing.  Has been taking azithromycin and has 1 more pill left.  When asking the husband if they got her prescriptions filled from the last time they were here, husband states, "I kept going up there but it was never ready".  Upon further assessment, I noticed that the prescription was still stapled to the discharge papers from last time.  This Probation officer explained that he must take the script to the pharmacy in order for it to be filled.

## 2013-11-17 NOTE — ED Provider Notes (Signed)
CSN: [id number]     Arrival date & time 11/17/13  1349 History   First MD Initiated Contact with Patient 11/17/13 1417     Chief Complaint  Patient presents with  . Cough     HPI  Patient p/w concern of persistent cough.  Cough began ~5d pta.  Since onset Sx have been persistent in spite of Azithro (500 x1d, 200 x3d), Benzonatate, albuterol.  No objective fever.  Minimal CP w coughing, none otherwise. No ha, syncope, emesis / diarrhea. No sick contacts. Hx of smoking and CA - neither currently.    Past Medical History  Diagnosis Date  . Cirrhosis 08/26/2011  . Colon cancer 2001  . Bladder cancer 2003  . Memory loss     d/t chemotherapy;short and long term;takes Aricept daily  . Thrombocytopenia   . Osteoporosis     takes Vit D3 daily  . Blood transfusion   . Arthritis   . Complication of anesthesia     confusion x 2 to 3 days  . Hypertension     takes Ramipril and Metoprolol daily  . History of blood clots 50+yrs ago    legs when she was pregnancy  . Headache(784.0)     occasionally  . Joint pain   . Joint swelling   . GERD (gastroesophageal reflux disease)     takes Protonix daily  . History of GI bleed   . Constipation   . Hemorrhoids   . History of colon polyps   . Anemia, iron deficiency     iron injection about 6wks ago  . Cataract immature   . Depression     takes Celexa daily  . Attention to urostomy     urostomy d/t hx bladder cancer  . Blood dyscrasia     thromboctopenia   Past Surgical History  Procedure Laterality Date  . Colon surgery  2004  . Total knee arthroplasty  2010    Left knee  . Colon surgery  2001  . Colostomy    . Bladder surgery      bladder removed d/t cancer  . Esophagogastroduodenoscopy  02/27/2012    Procedure: ESOPHAGOGASTRODUODENOSCOPY (EGD);  Surgeon: [name], MD;  Location: [name];  Service: Endoscopy;  Laterality: N/A;  bedside case  . Appendectomy    . Abdominal hysterectomy    . Cholecystectomy    .  Colonoscopy     Family History  Problem Relation Age of Onset  . Diabetes Brother   . Cancer Other   . Hypertension Other    History  Substance Use Topics  . Smoking status: Former Smoker -- 0.50 packs/day for 35 years    Types: Cigarettes    Start date: 10/25/1955    Quit date: 09/22/1991  . Smokeless tobacco: Never Used     Comment: quit 23years ago  . Alcohol Use: No   OB History   Grav Para Term Preterm Abortions TAB SAB Ect Mult Living                 Review of Systems  Constitutional:       Per HPI, otherwise negative  HENT:       Per HPI, otherwise negative  Respiratory:       Per HPI, otherwise negative  Cardiovascular:       Per HPI, otherwise negative  Gastrointestinal: Negative for vomiting.  Endocrine:       Negative aside from HPI  Genitourinary:  Neg aside from HPI   Musculoskeletal:       Per HPI, otherwise negative  Skin: Negative.   Neurological: Negative for syncope.      Allergies  Codeine; Morphine and related; Other; Promethazine hcl; and Penicillins  Home Medications   Current Outpatient Rx  Name  Route  Sig  Dispense  Refill  . acetaminophen (TYLENOL) 500 MG tablet   Oral   Take 1,000 mg by mouth every 6 (six) hours as needed for mild pain.         . benzonatate (TESSALON) 200 MG capsule   Oral   Take 200 mg by mouth 3 (three) times daily as needed for cough.         . Cholecalciferol (VITAMIN D3) 1000 UNITS CAPS   Oral   Take 1 capsule by mouth daily.           . Cinnamon 500 MG capsule   Oral   Take 500 mg by mouth daily.          . citalopram (CELEXA) 40 MG tablet   Oral   Take 20 mg by mouth daily.         . Cyanocobalamin (VITAMIN B 12 PO)   Oral   Take by mouth every morning.         Marland Kitchen dextrose 5 % SOLN 125 mL with azithromycin 500 MG SOLR 250 mg   Intravenous   Inject 250-500 mg into the vein daily.         . fexofenadine (ALLEGRA) 180 MG tablet   Oral   Take 180 mg by mouth daily.          . fish oil-omega-3 fatty acids 1000 MG capsule   Oral   Take 2 g by mouth daily.           . fluticasone (FLONASE) 50 MCG/ACT nasal spray   Each Nare   Place 1 spray into both nostrils at bedtime.          . Ginkgo 60 MG TABS   Oral   Take 60 mg by mouth every morning.         Marland Kitchen guaiFENesin (ROBITUSSIN) 100 MG/5ML liquid   Oral   Take 5-10 mLs (100-200 mg total) by mouth every 4 (four) hours as needed for cough.   60 mL   0   . Multiple Vitamin (MULTI VITAMIN DAILY PO)   Oral   Take by mouth every morning.         . nadolol (CORGARD) 20 MG tablet   Oral   Take 10 mg by mouth daily.         . ramipril (ALTACE) 5 MG capsule   Oral   Take 5 mg by mouth Daily.          BP 144/54  Pulse 66  Temp(Src) 98.5 F (36.9 C) (Oral)  Resp 18  SpO2 97% Physical Exam  Nursing note and vitals reviewed. Constitutional: She is oriented to person, place, and time. She appears well-developed and well-nourished. No distress.  HENT:  Head: Normocephalic and atraumatic.  Eyes: Conjunctivae and EOM are normal.  Cardiovascular: Normal rate and regular rhythm.   Pulmonary/Chest: Effort normal and breath sounds normal. No stridor. No respiratory distress.  Abdominal: She exhibits no distension.  Musculoskeletal: She exhibits no edema.  Neurological: She is alert and oriented to person, place, and time. No cranial nerve deficit.  Skin: Skin is warm and dry.  Psychiatric: She has  a normal mood and affect.    ED Course  Procedures (including critical care time) Imaging Review Dg Chest 2 View  11/17/2013   CLINICAL DATA:  Cough, history smoking, cirrhosis, bladder cancer, colon cancer, hypertension  EXAM: CHEST  2 VIEW  COMPARISON:  11/15/2013  FINDINGS: Minimal enlargement of cardiac silhouette.  Calcified tortuous aorta.  Pulmonary vascularity normal.  Scarring at lateral right upper lobe and laterally at right base.  No acute infiltrate, pleural effusion or pneumothorax.   Scattered endplate spur formation thoracic spine.  IMPRESSION: Right lung scarring.  Minimal enlargement of cardiac silhouette.  No acute abnormalities.   Electronically Signed   By: Lavonia Dana M.D.   On: 11/17/2013 14:51  I interpreted the XR, agree with the interpretation.    I reviewed the chart from earlier this week.   Patient received albuterol after initial eval.   Update: Patient substantially better after albuterol therapy.  I discussed x-ray results, thoughts with the patient and her husband. MDM   Patient presents with ongoing cough.  Patient has no evidence of pneumonia, is afebrile.   Patient presents initially with albuterol therapy here.  No evidence of distress, pneumonia, patient likely has bronchitis.  She was started on steroids, started to have every 4 hour albuterol therapy. She will follow up with primary care.  ACE inhibitor related cough is a possibility, though this seems unlikely given the duration of using this medication.  However, with this, as well as other possibilities for her cough, patient was encouraged to follow up with her primary care physician.      Carmin Muskrat, MD 11/17/13 2342

## 2013-11-17 NOTE — Discharge Instructions (Signed)
As discussed, your evaluation today has been largely reassuring.  But, it is important that you monitor your condition carefully, and do not hesitate to return to the ED if you develop new, or concerning changes in your condition.  For the next two days it is important that you use your albuterol every four hours (in addition to taking all other medication as directed).  Otherwise, please follow-up with your physician for appropriate ongoing care.    Cough, Adult  A cough is a reflex. It helps you clear your throat and airways. A cough can help heal your body. A cough can last 2 or 3 weeks (acute) or may last more than 8 weeks (chronic). Some common causes of a cough can include an infection, allergy, or a cold. HOME CARE  Only take medicine as told by your doctor.  If given, take your medicines (antibiotics) as told. Finish them even if you start to feel better.  Use a cold steam vaporizer or humidier in your home. This can help loosen thick spit (secretions).  Sleep so you are almost sitting up (semi-upright). Use pillows to do this. This helps reduce coughing.  Rest as needed.  Stop smoking if you smoke. GET HELP RIGHT AWAY IF:  You have yellowish-white fluid (pus) in your thick spit.  Your cough gets worse.  Your medicine does not reduce coughing, and you are losing sleep.  You cough up blood.  You have trouble breathing.  Your pain gets worse and medicine does not help.  You have a fever. MAKE SURE YOU:   Understand these instructions.  Will watch your condition.  Will get help right away if you are not doing well or get worse. Document Released: 05/22/2011 Document Revised: 12/01/2011 Document Reviewed: 05/22/2011 [facility] Patient Information 2014 [place].

## 2013-11-23 DIAGNOSIS — J984 Other disorders of lung: Secondary | ICD-10-CM | POA: Diagnosis not present

## 2013-11-23 DIAGNOSIS — R059 Cough, unspecified: Secondary | ICD-10-CM | POA: Diagnosis not present

## 2013-11-23 DIAGNOSIS — R05 Cough: Secondary | ICD-10-CM | POA: Diagnosis not present

## 2013-11-23 DIAGNOSIS — J309 Allergic rhinitis, unspecified: Secondary | ICD-10-CM | POA: Diagnosis not present

## 2013-11-28 DIAGNOSIS — N200 Calculus of kidney: Secondary | ICD-10-CM | POA: Diagnosis not present

## 2013-11-28 DIAGNOSIS — N281 Cyst of kidney, acquired: Secondary | ICD-10-CM | POA: Diagnosis not present

## 2013-11-28 DIAGNOSIS — D3000 Benign neoplasm of unspecified kidney: Secondary | ICD-10-CM | POA: Diagnosis not present

## 2013-12-19 DIAGNOSIS — R42 Dizziness and giddiness: Secondary | ICD-10-CM | POA: Diagnosis not present

## 2013-12-19 DIAGNOSIS — N390 Urinary tract infection, site not specified: Secondary | ICD-10-CM | POA: Diagnosis not present

## 2013-12-19 DIAGNOSIS — H9209 Otalgia, unspecified ear: Secondary | ICD-10-CM | POA: Diagnosis not present

## 2013-12-26 DIAGNOSIS — H9209 Otalgia, unspecified ear: Secondary | ICD-10-CM | POA: Diagnosis not present

## 2014-01-10 ENCOUNTER — Other Ambulatory Visit: Payer: Self-pay | Admitting: Gastroenterology

## 2014-01-10 DIAGNOSIS — Z85038 Personal history of other malignant neoplasm of large intestine: Secondary | ICD-10-CM | POA: Diagnosis not present

## 2014-01-10 DIAGNOSIS — K7460 Unspecified cirrhosis of liver: Secondary | ICD-10-CM | POA: Diagnosis not present

## 2014-01-16 ENCOUNTER — Ambulatory Visit
Admission: RE | Admit: 2014-01-16 | Discharge: 2014-01-16 | Disposition: A | Discharge status: Home / Self Care | Payer: Medicare Other | Source: Ambulatory Visit | Attending: Gastroenterology | Admitting: Gastroenterology

## 2014-01-16 DIAGNOSIS — K7460 Unspecified cirrhosis of liver: Secondary | ICD-10-CM

## 2014-01-16 DIAGNOSIS — N200 Calculus of kidney: Secondary | ICD-10-CM | POA: Diagnosis not present

## 2014-01-23 ENCOUNTER — Telehealth: Payer: Self-pay | Admitting: Hematology & Oncology

## 2014-01-23 ENCOUNTER — Ambulatory Visit (HOSPITAL_BASED_OUTPATIENT_CLINIC_OR_DEPARTMENT_OTHER): Payer: Medicare Other | Admitting: Hematology & Oncology

## 2014-01-23 ENCOUNTER — Encounter: Payer: Self-pay | Admitting: Hematology & Oncology

## 2014-01-23 ENCOUNTER — Other Ambulatory Visit (HOSPITAL_BASED_OUTPATIENT_CLINIC_OR_DEPARTMENT_OTHER): Payer: Medicare Other | Admitting: Lab

## 2014-01-23 VITALS — BP 134/61 | HR 65 | Temp 97.4°F | Resp 14 | Ht 66.0 in | Wt 227.0 lb

## 2014-01-23 DIAGNOSIS — K7460 Unspecified cirrhosis of liver: Secondary | ICD-10-CM

## 2014-01-23 DIAGNOSIS — D696 Thrombocytopenia, unspecified: Secondary | ICD-10-CM

## 2014-01-23 DIAGNOSIS — C679 Malignant neoplasm of bladder, unspecified: Secondary | ICD-10-CM | POA: Diagnosis not present

## 2014-01-23 DIAGNOSIS — D509 Iron deficiency anemia, unspecified: Secondary | ICD-10-CM | POA: Diagnosis not present

## 2014-01-23 DIAGNOSIS — D72819 Decreased white blood cell count, unspecified: Secondary | ICD-10-CM | POA: Diagnosis not present

## 2014-01-23 LAB — RETICULOCYTES (CHCC)
ABS RETIC: 83 10*3/uL (ref 19.0–186.0)
RBC.: 3.95 MIL/uL (ref 3.87–5.11)
Retic Ct Pct: 2.1 % (ref 0.4–2.3)

## 2014-01-23 LAB — CBC WITH DIFFERENTIAL (CANCER CENTER ONLY)
BASO#: 0 10*3/uL (ref 0.0–0.2)
BASO%: 0.7 % (ref 0.0–2.0)
EOS%: 2.4 % (ref 0.0–7.0)
Eosinophils Absolute: 0.1 10*3/uL (ref 0.0–0.5)
HCT: 35.6 % (ref 34.8–46.6)
HGB: 11.9 g/dL (ref 11.6–15.9)
LYMPH#: 0.6 10*3/uL — ABNORMAL LOW (ref 0.9–3.3)
LYMPH%: 19.5 % (ref 14.0–48.0)
MCH: 31 pg (ref 26.0–34.0)
MCHC: 33.4 g/dL (ref 32.0–36.0)
MCV: 93 fL (ref 81–101)
MONO#: 0.4 10*3/uL (ref 0.1–0.9)
MONO%: 12.5 % (ref 0.0–13.0)
NEUT#: 1.9 10*3/uL (ref 1.5–6.5)
NEUT%: 64.9 % (ref 39.6–80.0)
PLATELETS: 73 10*3/uL — AB (ref 145–400)
RBC: 3.84 10*6/uL (ref 3.70–5.32)
RDW: 15.3 % (ref 11.1–15.7)
WBC: 3 10*3/uL — ABNORMAL LOW (ref 3.9–10.0)

## 2014-01-23 LAB — FERRITIN CHCC: FERRITIN: 56 ng/mL (ref 9–269)

## 2014-01-23 LAB — CHCC SATELLITE - SMEAR

## 2014-01-23 LAB — IRON AND TIBC CHCC
%SAT: 24 % (ref 21–57)
IRON: 57 ug/dL (ref 41–142)
TIBC: 235 ug/dL — AB (ref 236–444)
UIBC: 178 ug/dL (ref 120–384)

## 2014-01-23 NOTE — Telephone Encounter (Signed)
Pt aware of 8-31 appointment

## 2014-01-23 NOTE — Progress Notes (Signed)
Hematology and Oncology Follow Up Visit  [name] [id number] Sep 15, 1941 73 y.o. 01/23/2014   Principle Diagnosis:  1. Recurrent iron-deficiency anemia. 2. 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 3. Remote history of stage II (T3, N0, M0) adenocarcinoma of colon. 4. 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  Current Therapy:   IV iron as indicated.     Interim History:  [name] is back for followup. She's been doing okay. She is a little bit of a cough when her with bronchitis.  She's been having some left-sided abdominal pain. She did have an ultrasound done by [facility] gastroenterologist. This was done on April 27. This showed the cirrhosis. She had splenomegaly. Also that, nothing else was noted that appeared to be suspicious. Of course, the radiologist said that a 1.5 cm lesion in the left kidney could not be ruled out as a small carcinoma.  Her dementia continues to be her biggest problem. She actually has no memory.  We [name] back in January of 2014. Who saw her back in February, her ferritin was 29 with an iron saturation of 50%. [name] has a urostomy bag. This is working well.  Medications: Current outpatient prescriptions:acetaminophen (TYLENOL) 500 MG tablet, Take 1,000 mg by mouth every 6 (six) hours as needed for mild pain., Disp: , Rfl: ;  Cholecalciferol (VITAMIN D3) 1000 UNITS CAPS, Take 1 capsule by mouth daily.  , Disp: , Rfl: ;  Cinnamon 500 MG capsule, Take 500 mg by mouth daily. , Disp: , Rfl: ;  citalopram (CELEXA) 40 MG tablet, Take 20 mg by mouth daily., Disp: , Rfl:  Cyanocobalamin (VITAMIN B 12 PO), Take by mouth every morning., Disp: , Rfl: ;  fexofenadine (ALLEGRA) 180 MG tablet, Take 180 mg by mouth daily., Disp: , Rfl: ;  fish oil-omega-3 fatty acids 1000 MG capsule, Take 2 g by mouth daily.  , Disp: , Rfl: ;  Ginkgo 60 MG TABS, Take 60 mg by mouth  every morning., Disp: , Rfl: ;  montelukast (SINGULAIR) 10 MG tablet, Take 10 mg by mouth at bedtime., Disp: , Rfl:  Multiple Vitamin (MULTI VITAMIN DAILY PO), Take by mouth every morning., Disp: , Rfl: ;  ramipril (ALTACE) 5 MG capsule, Take 5 mg by mouth Daily., Disp: , Rfl:   Allergies:  Allergies  Allergen Reactions  . Codeine Swelling and Rash    Swelling in throat  . Morphine And Related Swelling and Rash  . Other Anaphylaxis    Bee Stings  . Promethazine Hcl     Per MD chart - 08/08/11  . Penicillins Rash    Past Medical History, Surgical history, Social history, and Family History were reviewed and updated.  Review of Systems: As above  Physical Exam:  height is 5' 6"  (1.676 m) and weight is 227 lb (102.967 kg). Her oral temperature is 97.4 F (36.3 C). Her blood pressure is 134/61 and her pulse is 65. Her respiration is 14.   Lungs are clear. Cardiac exam regular in rhythm. Abdomen soft. Urostomy in the right lower quadrant. There is no guarding or rebound tenderness. She has no mass. As a palpable spleen tip. No palpable hepato- megaly. Back exam no tenderness over the spine. Extremities shows some chronic mild nonpitting edema of the legs. Skin exam no rashes. Neurological exam nonfocal.  Lab Results  Component Value Date   WBC 3.0*  01/23/2014   HGB 11.9 01/23/2014   HCT 35.6 01/23/2014   MCV 93 01/23/2014   PLT 73* 01/23/2014     Chemistry      Component Value Date/Time   NA 141 11/15/2013 0538   NA 143 07/01/2010 0949   K 3.4* 11/15/2013 0538   K 4.2 07/01/2010 0949   CL 101 11/15/2013 0538   CL 99 07/01/2010 0949   CO2 27 11/15/2013 0525   CO2 33 07/01/2010 0949   BUN 6 11/15/2013 0538   BUN 13 07/01/2010 0949   CREATININE 0.80 11/15/2013 0538   CREATININE 0.7 07/01/2010 0949      Component Value Date/Time   CALCIUM 8.9 11/15/2013 0525   CALCIUM 9.4 07/01/2010 0949   ALKPHOS 93 11/15/2013 0525   ALKPHOS 87* 07/01/2010 0949   AST 39* 11/15/2013 0525   AST 24  07/01/2010 0949   ALT 21 11/15/2013 0525   ALT 19 07/01/2010 0949   BILITOT 1.4* 11/15/2013 0525   BILITOT 1.60 07/01/2010 0949         Impression and Plan: [name] is 73 year old white female with multiple medical problems. Family, her malignancies are not an issue. She has the cirrhosis. The thrombocytopenia will always be part of a picture with the cirrhosis.  Her platelet count is going up slowly. I suspect that she probably is becoming iron deficient again. Again it's been over a year since she had iron.  We will certainly see her back every 3 or 4 months.  We will call her husband with results.   [name], MD 5/4/20151:10 PM

## 2014-01-25 ENCOUNTER — Telehealth: Payer: Self-pay | Admitting: *Deleted

## 2014-01-25 ENCOUNTER — Other Ambulatory Visit: Payer: Self-pay | Admitting: *Deleted

## 2014-01-25 DIAGNOSIS — D509 Iron deficiency anemia, unspecified: Secondary | ICD-10-CM

## 2014-01-25 NOTE — Telephone Encounter (Addendum)
Message copied by Lenn Sink on Wed Jan 25, 2014 12:25 PM ------      Message from: Burney Gauze R      Created: Mon Jan 23, 2014  6:44 PM       Please call her husband and let him know that her iron is low. Please set up Feraheme at 1020 mg x1 dose. This can be done in one or 2 weeks. Thanks. Pete ------Called husband and transferred him to the scheduler to set up appt for iron transfusion

## 2014-01-27 ENCOUNTER — Ambulatory Visit (HOSPITAL_BASED_OUTPATIENT_CLINIC_OR_DEPARTMENT_OTHER): Payer: Medicare Other

## 2014-01-27 VITALS — BP 125/63 | HR 59 | Temp 97.3°F | Resp 20

## 2014-01-27 DIAGNOSIS — D509 Iron deficiency anemia, unspecified: Secondary | ICD-10-CM

## 2014-01-27 MED ORDER — SODIUM CHLORIDE 0.9 % IV SOLN
1020.0000 mg | Freq: Once | INTRAVENOUS | Status: AC
  Administered 2014-01-27: 1020 mg via INTRAVENOUS
  Filled 2014-01-27: qty 34

## 2014-01-27 MED ORDER — SODIUM CHLORIDE 0.9 % IV SOLN
INTRAVENOUS | Status: DC
  Administered 2014-01-27: 09:00:00 via INTRAVENOUS

## 2014-01-27 NOTE — Patient Instructions (Signed)

## 2014-02-07 ENCOUNTER — Other Ambulatory Visit: Payer: Self-pay | Admitting: Gastroenterology

## 2014-02-07 DIAGNOSIS — Z85038 Personal history of other malignant neoplasm of large intestine: Secondary | ICD-10-CM | POA: Diagnosis not present

## 2014-02-07 DIAGNOSIS — K766 Portal hypertension: Secondary | ICD-10-CM | POA: Diagnosis not present

## 2014-02-07 DIAGNOSIS — D128 Benign neoplasm of rectum: Secondary | ICD-10-CM | POA: Diagnosis not present

## 2014-02-07 DIAGNOSIS — I8500 Esophageal varices without bleeding: Secondary | ICD-10-CM | POA: Diagnosis not present

## 2014-02-07 DIAGNOSIS — D126 Benign neoplasm of colon, unspecified: Secondary | ICD-10-CM | POA: Diagnosis not present

## 2014-02-07 DIAGNOSIS — Z09 Encounter for follow-up examination after completed treatment for conditions other than malignant neoplasm: Secondary | ICD-10-CM | POA: Diagnosis not present

## 2014-02-07 DIAGNOSIS — K2960 Other gastritis without bleeding: Secondary | ICD-10-CM | POA: Diagnosis not present

## 2014-02-28 ENCOUNTER — Encounter (HOSPITAL_COMMUNITY): Payer: Self-pay | Admitting: Emergency Medicine

## 2014-02-28 ENCOUNTER — Inpatient Hospital Stay (HOSPITAL_COMMUNITY)
Admission: EM | Admit: 2014-02-28 | Discharge: 2014-03-07 | DRG: 871 | Disposition: A | Discharge status: Home Health | Payer: Medicare Other | Attending: Internal Medicine | Admitting: Internal Medicine

## 2014-02-28 ENCOUNTER — Inpatient Hospital Stay (HOSPITAL_COMMUNITY): Payer: Medicare Other

## 2014-02-28 ENCOUNTER — Emergency Department (HOSPITAL_COMMUNITY): Payer: Medicare Other

## 2014-02-28 DIAGNOSIS — F4489 Other dissociative and conversion disorders: Secondary | ICD-10-CM | POA: Diagnosis not present

## 2014-02-28 DIAGNOSIS — Z888 Allergy status to other drugs, medicaments and biological substances status: Secondary | ICD-10-CM

## 2014-02-28 DIAGNOSIS — N390 Urinary tract infection, site not specified: Secondary | ICD-10-CM

## 2014-02-28 DIAGNOSIS — R799 Abnormal finding of blood chemistry, unspecified: Secondary | ICD-10-CM | POA: Diagnosis present

## 2014-02-28 DIAGNOSIS — I6789 Other cerebrovascular disease: Secondary | ICD-10-CM | POA: Diagnosis not present

## 2014-02-28 DIAGNOSIS — K7460 Unspecified cirrhosis of liver: Secondary | ICD-10-CM | POA: Diagnosis not present

## 2014-02-28 DIAGNOSIS — K922 Gastrointestinal hemorrhage, unspecified: Secondary | ICD-10-CM | POA: Diagnosis not present

## 2014-02-28 DIAGNOSIS — Z8551 Personal history of malignant neoplasm of bladder: Secondary | ICD-10-CM

## 2014-02-28 DIAGNOSIS — G9340 Encephalopathy, unspecified: Secondary | ICD-10-CM | POA: Diagnosis present

## 2014-02-28 DIAGNOSIS — N200 Calculus of kidney: Secondary | ICD-10-CM | POA: Diagnosis not present

## 2014-02-28 DIAGNOSIS — R161 Splenomegaly, not elsewhere classified: Secondary | ICD-10-CM | POA: Diagnosis present

## 2014-02-28 DIAGNOSIS — C679 Malignant neoplasm of bladder, unspecified: Secondary | ICD-10-CM

## 2014-02-28 DIAGNOSIS — Z885 Allergy status to narcotic agent status: Secondary | ICD-10-CM

## 2014-02-28 DIAGNOSIS — J9819 Other pulmonary collapse: Secondary | ICD-10-CM | POA: Diagnosis present

## 2014-02-28 DIAGNOSIS — R609 Edema, unspecified: Secondary | ICD-10-CM | POA: Diagnosis present

## 2014-02-28 DIAGNOSIS — I81 Portal vein thrombosis: Secondary | ICD-10-CM | POA: Diagnosis present

## 2014-02-28 DIAGNOSIS — N1330 Unspecified hydronephrosis: Secondary | ICD-10-CM | POA: Diagnosis not present

## 2014-02-28 DIAGNOSIS — Z936 Other artificial openings of urinary tract status: Secondary | ICD-10-CM | POA: Diagnosis not present

## 2014-02-28 DIAGNOSIS — R7881 Bacteremia: Secondary | ICD-10-CM

## 2014-02-28 DIAGNOSIS — C189 Malignant neoplasm of colon, unspecified: Secondary | ICD-10-CM

## 2014-02-28 DIAGNOSIS — I8500 Esophageal varices without bleeding: Secondary | ICD-10-CM

## 2014-02-28 DIAGNOSIS — A4151 Sepsis due to Escherichia coli [E. coli]: Secondary | ICD-10-CM | POA: Diagnosis not present

## 2014-02-28 DIAGNOSIS — R17 Unspecified jaundice: Secondary | ICD-10-CM

## 2014-02-28 DIAGNOSIS — M810 Age-related osteoporosis without current pathological fracture: Secondary | ICD-10-CM | POA: Diagnosis present

## 2014-02-28 DIAGNOSIS — A498 Other bacterial infections of unspecified site: Secondary | ICD-10-CM | POA: Diagnosis not present

## 2014-02-28 DIAGNOSIS — K921 Melena: Secondary | ICD-10-CM

## 2014-02-28 DIAGNOSIS — R197 Diarrhea, unspecified: Secondary | ICD-10-CM

## 2014-02-28 DIAGNOSIS — E872 Acidosis, unspecified: Secondary | ICD-10-CM

## 2014-02-28 DIAGNOSIS — A419 Sepsis, unspecified organism: Secondary | ICD-10-CM | POA: Diagnosis not present

## 2014-02-28 DIAGNOSIS — R4182 Altered mental status, unspecified: Secondary | ICD-10-CM

## 2014-02-28 DIAGNOSIS — K7689 Other specified diseases of liver: Secondary | ICD-10-CM | POA: Diagnosis present

## 2014-02-28 DIAGNOSIS — C9200 Acute myeloblastic leukemia, not having achieved remission: Secondary | ICD-10-CM | POA: Diagnosis not present

## 2014-02-28 DIAGNOSIS — D509 Iron deficiency anemia, unspecified: Secondary | ICD-10-CM | POA: Diagnosis present

## 2014-02-28 DIAGNOSIS — Z880 Allergy status to penicillin: Secondary | ICD-10-CM

## 2014-02-28 DIAGNOSIS — R6520 Severe sepsis without septic shock: Secondary | ICD-10-CM

## 2014-02-28 DIAGNOSIS — F411 Generalized anxiety disorder: Secondary | ICD-10-CM | POA: Diagnosis present

## 2014-02-28 DIAGNOSIS — Q619 Cystic kidney disease, unspecified: Secondary | ICD-10-CM | POA: Diagnosis not present

## 2014-02-28 DIAGNOSIS — Z860100 Personal history of colon polyps, unspecified: Secondary | ICD-10-CM

## 2014-02-28 DIAGNOSIS — A4150 Gram-negative sepsis, unspecified: Secondary | ICD-10-CM | POA: Diagnosis not present

## 2014-02-28 DIAGNOSIS — I10 Essential (primary) hypertension: Secondary | ICD-10-CM | POA: Diagnosis present

## 2014-02-28 DIAGNOSIS — K439 Ventral hernia without obstruction or gangrene: Secondary | ICD-10-CM

## 2014-02-28 DIAGNOSIS — B9620 Unspecified Escherichia coli [E. coli] as the cause of diseases classified elsewhere: Secondary | ICD-10-CM

## 2014-02-28 DIAGNOSIS — S42309A Unspecified fracture of shaft of humerus, unspecified arm, initial encounter for closed fracture: Secondary | ICD-10-CM

## 2014-02-28 DIAGNOSIS — Z96659 Presence of unspecified artificial knee joint: Secondary | ICD-10-CM

## 2014-02-28 DIAGNOSIS — Z452 Encounter for adjustment and management of vascular access device: Secondary | ICD-10-CM | POA: Diagnosis not present

## 2014-02-28 DIAGNOSIS — Z933 Colostomy status: Secondary | ICD-10-CM

## 2014-02-28 DIAGNOSIS — D649 Anemia, unspecified: Secondary | ICD-10-CM | POA: Diagnosis not present

## 2014-02-28 DIAGNOSIS — K219 Gastro-esophageal reflux disease without esophagitis: Secondary | ICD-10-CM | POA: Diagnosis present

## 2014-02-28 DIAGNOSIS — I248 Other forms of acute ischemic heart disease: Secondary | ICD-10-CM | POA: Diagnosis present

## 2014-02-28 DIAGNOSIS — R945 Abnormal results of liver function studies: Secondary | ICD-10-CM

## 2014-02-28 DIAGNOSIS — D708 Other neutropenia: Secondary | ICD-10-CM | POA: Diagnosis present

## 2014-02-28 DIAGNOSIS — N12 Tubulo-interstitial nephritis, not specified as acute or chronic: Secondary | ICD-10-CM | POA: Diagnosis present

## 2014-02-28 DIAGNOSIS — R109 Unspecified abdominal pain: Secondary | ICD-10-CM

## 2014-02-28 DIAGNOSIS — N179 Acute kidney failure, unspecified: Secondary | ICD-10-CM | POA: Diagnosis present

## 2014-02-28 DIAGNOSIS — I2489 Other forms of acute ischemic heart disease: Secondary | ICD-10-CM | POA: Diagnosis present

## 2014-02-28 DIAGNOSIS — D72819 Decreased white blood cell count, unspecified: Secondary | ICD-10-CM

## 2014-02-28 DIAGNOSIS — M129 Arthropathy, unspecified: Secondary | ICD-10-CM | POA: Diagnosis present

## 2014-02-28 DIAGNOSIS — Z87891 Personal history of nicotine dependence: Secondary | ICD-10-CM | POA: Diagnosis not present

## 2014-02-28 DIAGNOSIS — Z833 Family history of diabetes mellitus: Secondary | ICD-10-CM

## 2014-02-28 DIAGNOSIS — R7989 Other specified abnormal findings of blood chemistry: Secondary | ICD-10-CM

## 2014-02-28 DIAGNOSIS — I959 Hypotension, unspecified: Secondary | ICD-10-CM | POA: Diagnosis not present

## 2014-02-28 DIAGNOSIS — Z79899 Other long term (current) drug therapy: Secondary | ICD-10-CM

## 2014-02-28 DIAGNOSIS — R509 Fever, unspecified: Secondary | ICD-10-CM | POA: Diagnosis not present

## 2014-02-28 DIAGNOSIS — D62 Acute posthemorrhagic anemia: Secondary | ICD-10-CM | POA: Diagnosis not present

## 2014-02-28 DIAGNOSIS — F0390 Unspecified dementia without behavioral disturbance: Secondary | ICD-10-CM | POA: Diagnosis present

## 2014-02-28 DIAGNOSIS — K250 Acute gastric ulcer with hemorrhage: Secondary | ICD-10-CM

## 2014-02-28 DIAGNOSIS — Z85038 Personal history of other malignant neoplasm of large intestine: Secondary | ICD-10-CM | POA: Diagnosis not present

## 2014-02-28 DIAGNOSIS — D6959 Other secondary thrombocytopenia: Secondary | ICD-10-CM | POA: Diagnosis present

## 2014-02-28 DIAGNOSIS — R0989 Other specified symptoms and signs involving the circulatory and respiratory systems: Secondary | ICD-10-CM | POA: Diagnosis not present

## 2014-02-28 DIAGNOSIS — D696 Thrombocytopenia, unspecified: Secondary | ICD-10-CM | POA: Diagnosis not present

## 2014-02-28 DIAGNOSIS — I369 Nonrheumatic tricuspid valve disorder, unspecified: Secondary | ICD-10-CM | POA: Diagnosis not present

## 2014-02-28 LAB — COMPREHENSIVE METABOLIC PANEL
ALT: 61 U/L — AB (ref 0–35)
AST: 122 U/L — ABNORMAL HIGH (ref 0–37)
Albumin: 2.7 g/dL — ABNORMAL LOW (ref 3.5–5.2)
Alkaline Phosphatase: 82 U/L (ref 39–117)
BUN: 17 mg/dL (ref 6–23)
CALCIUM: 9.4 mg/dL (ref 8.4–10.5)
CO2: 19 mEq/L (ref 19–32)
Chloride: 101 mEq/L (ref 96–112)
Creatinine, Ser: 1.12 mg/dL — ABNORMAL HIGH (ref 0.50–1.10)
GFR calc Af Amer: 55 mL/min — ABNORMAL LOW (ref >90)
GFR calc non Af Amer: 47 mL/min — ABNORMAL LOW (ref >90)
Glucose, Bld: 85 mg/dL (ref 70–99)
POTASSIUM: 3.7 meq/L (ref 3.7–5.3)
Sodium: 140 mEq/L (ref 137–147)
TOTAL PROTEIN: 5.5 g/dL — AB (ref 6.0–8.3)
Total Bilirubin: 6.3 mg/dL — ABNORMAL HIGH (ref 0.3–1.2)

## 2014-02-28 LAB — CBC
HCT: 39.7 % (ref 36.0–46.0)
Hemoglobin: 13.4 g/dL (ref 12.0–15.0)
MCH: 30.7 pg (ref 26.0–34.0)
MCHC: 33.8 g/dL (ref 30.0–36.0)
MCV: 90.8 fL (ref 78.0–100.0)
PLATELETS: 41 10*3/uL — AB (ref 150–400)
RBC: 4.37 MIL/uL (ref 3.87–5.11)
RDW: 16.8 % — ABNORMAL HIGH (ref 11.5–15.5)
WBC: 14.3 10*3/uL — ABNORMAL HIGH (ref 4.0–10.5)

## 2014-02-28 LAB — BLOOD GAS, ARTERIAL
ACID-BASE DEFICIT: 8.2 mmol/L — AB (ref 0.0–2.0)
Bicarbonate: 16 mEq/L — ABNORMAL LOW (ref 20.0–24.0)
Drawn by: 103701
O2 Content: 2 L/min
O2 SAT: 92.8 %
Patient temperature: 102.8
TCO2: 14.6 mmol/L (ref 0–100)
pCO2 arterial: 33.6 mmHg — ABNORMAL LOW (ref 35.0–45.0)
pH, Arterial: 7.312 — ABNORMAL LOW (ref 7.350–7.450)
pO2, Arterial: 77.8 mmHg — ABNORMAL LOW (ref 80.0–100.0)

## 2014-02-28 LAB — TYPE AND SCREEN
ABO/RH(D): A POS
ANTIBODY SCREEN: NEGATIVE

## 2014-02-28 LAB — APTT: APTT: 50 s — AB (ref 24–37)

## 2014-02-28 LAB — URINE MICROSCOPIC-ADD ON

## 2014-02-28 LAB — URINALYSIS, ROUTINE W REFLEX MICROSCOPIC
GLUCOSE, UA: NEGATIVE mg/dL
KETONES UR: NEGATIVE mg/dL
Nitrite: NEGATIVE
PROTEIN: 30 mg/dL — AB
Specific Gravity, Urine: 1.012 (ref 1.005–1.030)
Urobilinogen, UA: 1 mg/dL (ref 0.0–1.0)
pH: 7 (ref 5.0–8.0)

## 2014-02-28 LAB — LACTIC ACID, PLASMA
LACTIC ACID, VENOUS: 9.1 mmol/L — AB (ref 0.5–2.2)
Lactic Acid, Venous: 8.1 mmol/L — ABNORMAL HIGH (ref 0.5–2.2)

## 2014-02-28 LAB — I-STAT CG4 LACTIC ACID, ED: Lactic Acid, Venous: 8.75 mmol/L — ABNORMAL HIGH (ref 0.5–2.2)

## 2014-02-28 LAB — FIBRINOGEN: Fibrinogen: 179 mg/dL — ABNORMAL LOW (ref 204–475)

## 2014-02-28 LAB — MRSA PCR SCREENING: MRSA BY PCR: NEGATIVE

## 2014-02-28 LAB — PROTIME-INR
INR: 2.32 — ABNORMAL HIGH (ref 0.00–1.49)
Prothrombin Time: 24.7 seconds — ABNORMAL HIGH (ref 11.6–15.2)

## 2014-02-28 LAB — PROCALCITONIN: Procalcitonin: 94.81 ng/mL

## 2014-02-28 LAB — CARBOXYHEMOGLOBIN
Carboxyhemoglobin: 2.2 % — ABNORMAL HIGH (ref 0.5–1.5)
Methemoglobin: 1.5 % (ref 0.0–1.5)
O2 Saturation: 90.8 %
Total hemoglobin: 11.2 g/dL — ABNORMAL LOW (ref 12.0–16.0)

## 2014-02-28 LAB — CORTISOL: Cortisol, Plasma: 64.3 ug/dL

## 2014-02-28 LAB — TROPONIN I
TROPONIN I: 1.36 ng/mL — AB (ref <0.30)
Troponin I: 0.68 ng/mL (ref <0.30)

## 2014-02-28 MED ORDER — ACETAMINOPHEN 650 MG RE SUPP: 650.0000 mg | RECTAL | Status: DC | PRN

## 2014-02-28 MED ORDER — DEXTROSE 5 % IV SOLN
2.0000 g | Freq: Once | INTRAVENOUS | Status: AC
  Administered 2014-02-28: 2 g via INTRAVENOUS
  Filled 2014-02-28: qty 2

## 2014-02-28 MED ORDER — VANCOMYCIN HCL 10 G IV SOLR
1500.0000 mg | INTRAVENOUS | Status: DC
  Administered 2014-03-01: 1500 mg via INTRAVENOUS
  Filled 2014-02-28: qty 1500

## 2014-02-28 MED ORDER — FENTANYL CITRATE 0.05 MG/ML IJ SOLN
25.0000 ug | INTRAMUSCULAR | Status: DC | PRN
  Administered 2014-02-28 – 2014-03-01 (×4): 50 ug via INTRAVENOUS
  Administered 2014-03-01: 25 ug via INTRAVENOUS
  Administered 2014-03-01: 50 ug via INTRAVENOUS
  Filled 2014-02-28 (×5): qty 2

## 2014-02-28 MED ORDER — FENTANYL CITRATE 0.05 MG/ML IJ SOLN
INTRAMUSCULAR | Status: AC
  Filled 2014-02-28: qty 2

## 2014-02-28 MED ORDER — TRAMADOL HCL 50 MG PO TABS
50.0000 mg | ORAL_TABLET | Freq: Four times a day (QID) | ORAL | Status: DC | PRN
  Administered 2014-03-01 – 2014-03-04 (×3): 50 mg via ORAL
  Filled 2014-02-28 (×4): qty 1

## 2014-02-28 MED ORDER — CHLORHEXIDINE GLUCONATE 0.12 % MT SOLN
15.0000 mL | Freq: Two times a day (BID) | OROMUCOSAL | Status: DC
  Administered 2014-02-28 – 2014-03-07 (×13): 15 mL via OROMUCOSAL
  Filled 2014-02-28 (×17): qty 15

## 2014-02-28 MED ORDER — SODIUM CHLORIDE 0.9 % IV BOLUS (SEPSIS)
1000.0000 mL | Freq: Once | INTRAVENOUS | Status: AC
  Administered 2014-02-28: 1000 mL via INTRAVENOUS

## 2014-02-28 MED ORDER — VANCOMYCIN HCL 10 G IV SOLR
1500.0000 mg | Freq: Once | INTRAVENOUS | Status: AC
  Administered 2014-02-28: 1500 mg via INTRAVENOUS
  Filled 2014-02-28: qty 1500

## 2014-02-28 MED ORDER — IOHEXOL 300 MG/ML  SOLN
100.0000 mL | Freq: Once | INTRAMUSCULAR | Status: AC | PRN
  Administered 2014-02-28: 100 mL via INTRAVENOUS

## 2014-02-28 MED ORDER — SODIUM CHLORIDE 0.9 % IV BOLUS (SEPSIS): 1000.0000 mL | INTRAVENOUS | Status: DC | PRN

## 2014-02-28 MED ORDER — VANCOMYCIN HCL 1000 MG IV SOLR: 15.0000 mg/kg | Freq: Once | INTRAVENOUS | Status: DC

## 2014-02-28 MED ORDER — ALPRAZOLAM 0.25 MG PO TABS
0.2500 mg | ORAL_TABLET | Freq: Once | ORAL | Status: AC
  Administered 2014-03-01: 0.25 mg via ORAL
  Filled 2014-02-28: qty 1

## 2014-02-28 MED ORDER — HEPARIN SODIUM (PORCINE) 5000 UNIT/ML IJ SOLN
5000.0000 [IU] | Freq: Three times a day (TID) | INTRAMUSCULAR | Status: DC
  Administered 2014-02-28 – 2014-03-01 (×4): 5000 [IU] via SUBCUTANEOUS
  Filled 2014-02-28 (×8): qty 1

## 2014-02-28 MED ORDER — BIOTENE DRY MOUTH MT LIQD
15.0000 mL | Freq: Two times a day (BID) | OROMUCOSAL | Status: DC
  Administered 2014-02-28 – 2014-03-07 (×11): 15 mL via OROMUCOSAL

## 2014-02-28 MED ORDER — ACETAMINOPHEN 650 MG RE SUPP
975.0000 mg | Freq: Once | RECTAL | Status: AC
  Administered 2014-02-28: 975 mg via RECTAL
  Filled 2014-02-28 (×2): qty 1

## 2014-02-28 MED ORDER — SODIUM CHLORIDE 0.9 % IV SOLN
INTRAVENOUS | Status: DC
  Administered 2014-02-28 – 2014-03-03 (×5): via INTRAVENOUS

## 2014-02-28 MED ORDER — PANTOPRAZOLE SODIUM 40 MG IV SOLR
40.0000 mg | Freq: Two times a day (BID) | INTRAVENOUS | Status: DC
  Administered 2014-02-28 – 2014-03-01 (×3): 40 mg via INTRAVENOUS
  Filled 2014-02-28 (×3): qty 40

## 2014-02-28 MED ORDER — NOREPINEPHRINE BITARTRATE 1 MG/ML IV SOLN
2.0000 ug/min | INTRAVENOUS | Status: DC
  Administered 2014-02-28: 12 ug/min via INTRAVENOUS
  Administered 2014-02-28: 10 ug/min via INTRAVENOUS
  Filled 2014-02-28 (×2): qty 4

## 2014-02-28 MED ORDER — ACETAMINOPHEN 40 MG HALF SUPP: 1000.0000 mg | Freq: Once | RECTAL | Status: DC

## 2014-02-28 MED ORDER — DEXTROSE 5 % IV SOLN
1.0000 g | Freq: Two times a day (BID) | INTRAVENOUS | Status: DC
  Administered 2014-02-28 – 2014-03-01 (×2): 1 g via INTRAVENOUS
  Filled 2014-02-28 (×2): qty 1

## 2014-02-28 MED ORDER — SODIUM CHLORIDE 0.9 % IV BOLUS (SEPSIS)
1000.0000 mL | Freq: Once | INTRAVENOUS | Status: AC
Start: 2014-02-28 — End: 2014-02-28
  Administered 2014-02-28: 1000 mL via INTRAVENOUS

## 2014-02-28 NOTE — Procedures (Signed)
Central Venous Catheter Insertion Procedure Note [name] [id number] Mar 28, 1941  Procedure: Insertion of Central Venous Catheter Indications: Assessment of intravascular volume, Drug and/or fluid administration and Frequent blood sampling  Procedure Details Consent: Risks of procedure as well as the alternatives and risks of each were explained to the (patient/caregiver).  Consent for procedure obtained. Time Out: Verified patient identification, verified procedure, site/side was marked, verified correct patient position, special equipment/implants available, medications/allergies/relevent history reviewed, required imaging and test results available.  Performed  Maximum sterile technique was used including antiseptics, cap, gloves, gown, hand hygiene, mask and sheet. Skin prep: Chlorhexidine; local anesthetic administered A antimicrobial bonded/coated triple lumen catheter was placed in the left internal jugular vein using the Seldinger technique. Ultrasound guidance used.yes Catheter placed to 20 cm. Blood aspirated via all 3 ports and then flushed x 3. Line sutured x 4 and dressing applied.  Evaluation Blood flow good Complications: No apparent complications Patient did tolerate procedure well. Chest X-ray ordered to verify placement.  CXR: pending.  [name] Minor ACNP [name] PCCM Pager [phone] till 3 pm If no answer page [phone] 02/28/2014, 9:31 AM   I was present for procedure.  Chesley Mires, MD [facility] Pulmonary/Critical Care 02/28/2014, 9:50 AM Pager:  [phone] After 3pm call: [phone]

## 2014-02-28 NOTE — ED Notes (Signed)
Per EMS, patient is seen at local urology office (?[facility]). Patient with AMS, seeing "green and red things flying" while riding in with EMS. Patient very warm to touch. Patient with baseline memory loss. Patient husband called 911 due to patient altered mental status, acting abnormal x3 days. Patient husband en route, was not able to provide extensive detail.

## 2014-02-28 NOTE — ED Notes (Signed)
RT called to initiate ABG.

## 2014-02-28 NOTE — Progress Notes (Signed)
CRITICAL VALUE ALERT  Critical value received:  Troponin 1.38  Date of notification:  02/28/14  Time of notification:  1035   Critical value read back yes  Nurse who received alert:  Sharyn Lull   MD notified (1st page):  elink   Time of first page:  2235   MD notified (2nd page):none  Time of second page:none  Responding MD:  Dr. Titus Mould  Time MD responded: 2236

## 2014-02-28 NOTE — Consult Note (Signed)
Urology Consult   Physician requesting consult: Sood  Reason for consult: UTI mild left hydroureteronephrosis  History of Present Illness: [name] is a 73 y.o. female with PMH significant for dementia, cirrhosis, bladder Ca s/p cystectomy with urostomy, colon cancer, and thrombocytopenia who presented to the ED for eval of fever (155f and altered mental status.  Her daughters are present and provide hx as the pt has dementia.  They state that the pt's husband noted hematuria in her urostomy bag yesterday and she had an episode of vomiting yesterday.  Per the husband the pt had been complaining of pain in her left flank for approx 3 weeks and had been "warm to the touch" for several days.  Pt has had intermittent UTIs over the past few years but not frequently.  She was in her usual state of health until yesterday per her daughters.    In the ED her urine appeared infected, WBC was 14, and Cr 1.12.  CT scan revealed mild left hydronephrosis with possible obstructed pyonephrosis, known left lower pole renal stones, a known left upper pole AML, and a known large left lower pole cyst.  Urine was collected from her urostomy bag and sent for culture. She was started on Vanc and Maxipime.  Her urostomy functions well and her bag is changed by her husband every 3 to 5 days.  She is currently resting and without complaint.   Per Dr. WRalene Muskratlast office note 11/2013:  SRutareturns today in f/u. She had a 2.8cm stone removed from her ileal conduit on 11/19/10 and a KUB today shows no recurrence. He has a known LLP stone cluster that is stable at 174mon KUB and 1424mn renal US.[place] has a slightly larger 5m24mL on the LUP. She has a large LLP cyst that is slightly larger at 5.79cm today. There is no hydro.  She is many years out from radical cystectomy for bladder cancer.  She is doing well with her stoma and has no associated signs or symptoms today.     Past Medical History  Diagnosis Date   . Cirrhosis 08/26/2011  . Colon cancer 2001  . Bladder cancer 2003  . Memory loss     d/t chemotherapy;short and long term;takes Aricept daily  . Thrombocytopenia   . Osteoporosis     takes Vit D3 daily  . Blood transfusion   . Arthritis   . Complication of anesthesia     confusion x 2 to 3 days  . Hypertension     takes Ramipril and Metoprolol daily  . History of blood clots 50+yrs ago    legs when she was pregnancy  . Headache(784.0)     occasionally  . Joint pain   . Joint swelling   . GERD (gastroesophageal reflux disease)     takes Protonix daily  . History of GI bleed   . Constipation   . Hemorrhoids   . History of colon polyps   . Anemia, iron deficiency     iron injection about 6wks ago  . Cataract immature   . Depression     takes Celexa daily  . Attention to urostomy     urostomy d/t hx bladder cancer  . Blood dyscrasia     thromboctopenia    Past Surgical History  Procedure Laterality Date  . Colon surgery  2004  . Total knee arthroplasty  2010    Left knee  . Colon surgery  2001  .  Colostomy    . Bladder surgery      bladder removed d/t cancer  . Esophagogastroduodenoscopy  02/27/2012    Procedure: ESOPHAGOGASTRODUODENOSCOPY (EGD);  Surgeon: [name], MD;  Location: [name];  Service: Endoscopy;  Laterality: N/A;  bedside case  . Appendectomy    . Abdominal hysterectomy    . Cholecystectomy    . Colonoscopy       Current Hospital Medications:  Home meds:    Medication List    ASK your doctor about these medications       acetaminophen 500 MG tablet  Commonly known as:  TYLENOL  Take 1,000 mg by mouth every 6 (six) hours as needed for mild pain.     Cinnamon 500 MG capsule  Take 500 mg by mouth daily.     citalopram 40 MG tablet  Commonly known as:  CELEXA  Take 20 mg by mouth daily.     fexofenadine 180 MG tablet  Commonly known as:  ALLEGRA  Take 180 mg by mouth daily.     fish oil-omega-3 fatty acids 1000 MG capsule   Take 2 g by mouth daily.     Ginkgo 60 MG Tabs  Take 60 mg by mouth every morning.     montelukast 10 MG tablet  Commonly known as:  SINGULAIR  Take 10 mg by mouth at bedtime.     MULTI VITAMIN DAILY PO  Take by mouth every morning.     ramipril 5 MG capsule  Commonly known as:  ALTACE  Take 5 mg by mouth Daily.     VITAMIN B 12 PO  Take by mouth every morning.     Vitamin D3 1000 UNITS Caps  Take 1 capsule by mouth daily.        Scheduled Meds: . antiseptic oral rinse  15 mL Mouth Rinse q12n4p  . ceFEPime (MAXIPIME) IV  1 g Intravenous Q12H  . chlorhexidine  15 mL Mouth Rinse BID  . heparin  5,000 Units Subcutaneous 3 times per day  . pantoprazole (PROTONIX) IV  40 mg Intravenous Q12H  . [START ON 03/01/2014] vancomycin  1,500 mg Intravenous Q24H   Continuous Infusions: . sodium chloride 100 mL/hr at 02/28/14 1104  . norepinephrine (LEVOPHED) Adult infusion 10 mcg/min (02/28/14 1300)   PRN Meds:.fentaNYL, sodium chloride  Allergies:  Allergies  Allergen Reactions  . Codeine Swelling and Rash    Swelling in throat  . Morphine And Related Swelling and Rash  . Other Anaphylaxis    Bee Stings  . Promethazine Hcl     Per MD chart - 08/08/11  . Penicillins Rash    Family History  Problem Relation Age of Onset  . Diabetes Brother   . Cancer Other   . Hypertension Other     Social History:  reports that she quit smoking about 22 years ago. Her smoking use included Cigarettes. She started smoking about 58 years ago. She has a 17.5 pack-year smoking history. She has never used smokeless tobacco. She reports that she does not drink alcohol or use illicit drugs.  ROS: Unable to obtain from pt due to dementia  Physical Exam:  Vital signs in last 24 hours: Temp:  [99.3 F (37.4 C)-103.9 F (39.9 C)] 99.3 F (37.4 C) (06/09 1200) Pulse Rate:  [76-86] 83 (06/09 1300) Resp:  [20-36] 23 (06/09 1300) BP: (68-124)/(22-60) 106/41 mmHg (06/09 1300) SpO2:  [92 %-96  %] 95 % (06/09 1300) Weight:  [103 kg (227 lb  1.2 oz)] 103 kg (227 lb 1.2 oz) (06/09 0726) Constitutional:  Alert and oriented, No acute distress Cardiovascular: Regular rate and rhythm Respiratory: Normal respiratory effort GI: Abdomen is obese, soft, nontender, nondistended, no abdominal masses; urostomy functioning with pink urine in bag; ostomy pink and healthy. GU: No CVA tenderness Lymphatic: No lymphadenopathy Neurologic: Dementia Psychiatric: Normal mood and affect  Laboratory Data:   Recent Labs  02/28/14 0603  WBC 14.3*  HGB 13.4  HCT 39.7  PLT 41*     Recent Labs  02/28/14 0603  NA 140  K 3.7  CL 101  GLUCOSE 85  BUN 17  CALCIUM 9.4  CREATININE 1.12*     Results for orders placed during the hospital encounter of 02/28/14 (from the past 24 hour(s))  LACTIC ACID, PLASMA     Status: Abnormal   Collection Time    02/28/14  6:03 AM      Result Value Ref Range   Lactic Acid, Venous 9.1 (*) 0.5 - 2.2 mmol/L  CBC     Status: Abnormal   Collection Time    02/28/14  6:03 AM      Result Value Ref Range   WBC 14.3 (*) 4.0 - 10.5 K/uL   RBC 4.37  3.87 - 5.11 MIL/uL   Hemoglobin 13.4  12.0 - 15.0 g/dL   HCT 39.7  36.0 - 46.0 %   MCV 90.8  78.0 - 100.0 fL   MCH 30.7  26.0 - 34.0 pg   MCHC 33.8  30.0 - 36.0 g/dL   RDW 16.8 (*) 11.5 - 15.5 %   Platelets 41 (*) 150 - 400 K/uL  COMPREHENSIVE METABOLIC PANEL     Status: Abnormal   Collection Time    02/28/14  6:03 AM      Result Value Ref Range   Sodium 140  137 - 147 mEq/L   Potassium 3.7  3.7 - 5.3 mEq/L   Chloride 101  96 - 112 mEq/L   CO2 19  19 - 32 mEq/L   Glucose, Bld 85  70 - 99 mg/dL   BUN 17  6 - 23 mg/dL   Creatinine, Ser 1.12 (*) 0.50 - 1.10 mg/dL   Calcium 9.4  8.4 - 10.5 mg/dL   Total Protein 5.5 (*) 6.0 - 8.3 g/dL   Albumin 2.7 (*) 3.5 - 5.2 g/dL   AST 122 (*) 0 - 37 U/L   ALT 61 (*) 0 - 35 U/L   Alkaline Phosphatase 82  39 - 117 U/L   Total Bilirubin 6.3 (*) 0.3 - 1.2 mg/dL   GFR calc  non Af Amer 47 (*) >90 mL/min   GFR calc Af Amer 55 (*) >90 mL/min  PROCALCITONIN     Status: None   Collection Time    02/28/14  6:03 AM      Result Value Ref Range   Procalcitonin 94.81    I-STAT CG4 LACTIC ACID, ED     Status: Abnormal   Collection Time    02/28/14  6:15 AM      Result Value Ref Range   Lactic Acid, Venous 8.75 (*) 0.5 - 2.2 mmol/L  URINALYSIS, ROUTINE W REFLEX MICROSCOPIC     Status: Abnormal   Collection Time    02/28/14  6:30 AM      Result Value Ref Range   Color, Urine ORANGE (*) YELLOW   APPearance CLOUDY (*) CLEAR   Specific Gravity, Urine 1.012  1.005 - 1.030  pH 7.0  5.0 - 8.0   Glucose, UA NEGATIVE  NEGATIVE mg/dL   Hgb urine dipstick MODERATE (*) NEGATIVE   Bilirubin Urine SMALL (*) NEGATIVE   Ketones, ur NEGATIVE  NEGATIVE mg/dL   Protein, ur 30 (*) NEGATIVE mg/dL   Urobilinogen, UA 1.0  0.0 - 1.0 mg/dL   Nitrite NEGATIVE  NEGATIVE   Leukocytes, UA SMALL (*) NEGATIVE  URINE MICROSCOPIC-ADD ON     Status: Abnormal   Collection Time    02/28/14  6:30 AM      Result Value Ref Range   Squamous Epithelial / LPF RARE  RARE   WBC, UA 7-10  <3 WBC/hpf   RBC / HPF 7-10  <3 RBC/hpf   Bacteria, UA MANY (*) RARE  BLOOD GAS, ARTERIAL     Status: Abnormal   Collection Time    02/28/14  8:23 AM      Result Value Ref Range   O2 Content 2.0     Delivery systems NASAL CANNULA     pH, Arterial 7.312 (*) 7.350 - 7.450   pCO2 arterial 33.6 (*) 35.0 - 45.0 mmHg   pO2, Arterial 77.8 (*) 80.0 - 100.0 mmHg   Bicarbonate 16.0 (*) 20.0 - 24.0 mEq/L   TCO2 14.6  0 - 100 mmol/L   Acid-base deficit 8.2 (*) 0.0 - 2.0 mmol/L   O2 Saturation 92.8     Patient temperature 102.8     Collection site RIGHT RADIAL     Drawn by [id number]     Sample type ARTERIAL     Allens test (pass/fail) PASS  PASS  MRSA PCR SCREENING     Status: None   Collection Time    02/28/14  9:00 AM      Result Value Ref Range   MRSA by PCR NEGATIVE  NEGATIVE  PROTIME-INR     Status: Abnormal    Collection Time    02/28/14 10:15 AM      Result Value Ref Range   Prothrombin Time 24.7 (*) 11.6 - 15.2 seconds   INR 2.32 (*) 0.00 - 1.49  CORTISOL     Status: None   Collection Time    02/28/14 10:15 AM      Result Value Ref Range   Cortisol, Plasma 64.3    TROPONIN I     Status: Abnormal   Collection Time    02/28/14 10:15 AM      Result Value Ref Range   Troponin I 0.68 (*) <0.30 ng/mL  APTT     Status: Abnormal   Collection Time    02/28/14 10:15 AM      Result Value Ref Range   aPTT 50 (*) 24 - 37 seconds  FIBRINOGEN     Status: Abnormal   Collection Time    02/28/14 10:15 AM      Result Value Ref Range   Fibrinogen 179 (*) 204 - 475 mg/dL  TYPE AND SCREEN     Status: None   Collection Time    02/28/14 10:15 AM      Result Value Ref Range   ABO/RH(D) A POS     Antibody Screen NEG     Sample Expiration 03/03/2014    CARBOXYHEMOGLOBIN     Status: Abnormal   Collection Time    02/28/14 10:28 AM      Result Value Ref Range   Total hemoglobin 11.2 (*) 12.0 - 16.0 g/dL   O2 Saturation 90.8     Carboxyhemoglobin  2.2 (*) 0.5 - 1.5 %   Methemoglobin 1.5  0.0 - 1.5 %  LACTIC ACID, PLASMA     Status: Abnormal   Collection Time    02/28/14 10:41 AM      Result Value Ref Range   Lactic Acid, Venous 8.1 (*) 0.5 - 2.2 mmol/L   Recent Results (from the past 240 hour(s))  MRSA PCR SCREENING     Status: None   Collection Time    02/28/14  9:00 AM      Result Value Ref Range Status   MRSA by PCR NEGATIVE  NEGATIVE Final   Comment:            The GeneXpert MRSA Assay (FDA     approved for NASAL specimens     only), is one component of a     comprehensive MRSA colonization     surveillance program. It is not     intended to diagnose MRSA     infection nor to guide or     monitor treatment for     MRSA infections.    Renal Function:  Recent Labs  02/28/14 0603  CREATININE 1.12*   Estimated Creatinine Clearance: 53.6 ml/min (by C-G formula based on Cr of  1.12).  Radiologic Imaging: Ct Abdomen Pelvis W Contrast  02/28/2014   CLINICAL DATA:  Elevated LFTs and bilirubin  EXAM: CT ABDOMEN AND PELVIS WITH CONTRAST  TECHNIQUE: Multidetector CT imaging of the abdomen and pelvis was performed using the standard protocol following bolus administration of intravenous contrast.  CONTRAST:  136m OMNIPAQUE IOHEXOL 300 MG/ML  SOLN  COMPARISON:  Prior CT abdomen/ pelvis 01/16/2012; abdominal ultrasound 01/16/2014  FINDINGS: Lower Chest: Moderately large hiatal hernia. Numerous paraesophageal varices. Cardiomegaly. No pericardial effusion. Mild dependent atelectasis in the lower lungs. Small calcified granuloma in the periphery of the right lower lobe. No suspicious pulmonary nodule or mass.  Abdomen: Unremarkable CT appearance of the stomach, duodenum, adrenal glands and pancreas save for fatty atrophy. Marked splenomegaly without significant interval change. No discrete splenic lesion. Abnormal appearance of the liver with cirrhotic morphology. The hepatic veins remain patent. 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 Thrombus is present at the porta splenic confluence and within the main right portal vein. The left and right branches of the portal vein a relatively atrophic compared to prior. Diffusely heterogeneous appearance of the liver. There is an ill-defined, amorphous region of hypoattenuation centrally within hepatic segments 8/5 which measures approximately 5.2 x 5.0 x 4.2 cm. Measurements are approximate due to the amorphous nature of the abnormality. There is no discrete enhancement and internal Hounsfield units are very low suggesting severe geographic fatty infiltration. Peripherally, the hepatic parenchyma is also very heterogeneous with areas of fatty infiltration of varying severity.  Interval change in the appearance of the left kidney. There is mild hydronephrosis  with diffuse thickening and enhancement of the urothelium high attenuation within the left upper pole collecting system may reflect early excretion of contrast material or interval development of nephrolithiasis. No definite stone within the UPJ yellow other is abrupt transition of the dilated and irregular renal pelvis to normal caliber ureter. The ureter at the UPJ is slightly full with enhancing soft tissue which represents enhancing soft tissue versus does collapsed and apposed enhancing thickened urothelium. Prior surgical changes of right hemicolectomy and cystectomy with right lower quadrant diverting ileostomy.  Stable renal cysts in the left kidney without  significant interval change. Unremarkable appearance of the right kidney. No hydronephrosis. Small locules of air within the renal collecting system are not unexpected in the setting of ileal diversion.  Surgical changes of prior right hemicolectomy. Colocolonic anastomosis in the left lower quadrant at the sigmoid colon, enteroenteric anastomosis in the mid abdomen and ileocolonic anastomosis in the right lower quadrant are all patent. No evidence of obstruction. Small volume of mesenteric stranding and nonloculated ascites. Multiple loops of small bowel are closely apposed to the undersurface of the anterior abdominal wall consistent with surgical adhesive disease.  Pelvis: Surgical changes of prior cystectomy and hysterectomy. Marked pelvic floor laxity. No suspicious adenopathy.  Bones/Soft Tissues: Similar appearance of fenestrated anterior abdominal wall with multiple ventral abdominal hernias containing omental fat and a small amounts of fluid. No acute fracture or aggressive appearing lytic or blastic osseous lesion.  Vascular: Atherosclerotic vascular disease without significant stenosis or aneurysmal dilatation.  IMPRESSION: 1. CT findings are most consistent with obstructed pyonephrosis involving the left kidney. There is mild hydronephrosis  with diffuse mild thickening and enhancement of the urothelium concerning for superimposed infection. Focal narrowing and soft tissue enhancement at the UPJ which could represent a small obstructing urothelial neoplasm, or simply apposition of the normal enhancing urothelium at a point of stenosis. No definite obstructing stone. 2. Amorphous hypo attenuating mass like lesion within the central aspect of the right hemi liver is nonspecific but favored to reflect a pseudo mass related to geographic fatty infiltration of the liver. However, given the patient's prior history of multiple malignancies and the background of hepatic cirrhosis, both primary hepatocellular carcinoma and metastasis are difficult to exclude entirely. Recommend further evaluation with nonemergent (when the patient is well enough to cooperate and breath hold) MRI of the abdomen with and without contrast. 3. Hepatic cirrhosis, hepatic steatosis and portal hypertension with evidence of esophageal varices. 4. Slight interval progression of nonocclusive portal vein thrombus compared to April of 2013. The right and left main portal veins are atretic. 5. Mesenteric edema and a small volume ascites likely related to underlying portal hypertension. 6. Similar appearance of a fenestrated ventral anterior abdominal wall with multiple omental fat containing hernias. 7. Evidence of intra-abdominal postsurgical adhesive disease without obstruction. 8. Moderately large hiatal hernia. 9. Nonobstructing stones in the upper pole of the left kidney. 10. Surgical changes of prior right hemi colectomy and cystectomy with patent colocolonic and enteroenteric anastomoses and right lower quadrant diverting ileostomy. 11. Additional ancillary findings as above.   Electronically Signed   By: Jacqulynn Cadet M.D.   On: 02/28/2014 08:44   Dg Chest Port 1 View  02/28/2014   CLINICAL DATA:  Central line placement.  EXAM: PORTABLE CHEST - 1 VIEW 9:56 a.m.  COMPARISON:   02/28/2014 at 5:35 a.m.  FINDINGS: Left jugular vein catheter has been inserted and the tip is in the region of the proximal left innominate vein. No pneumothorax. Heart size and pulmonary vascularity are normal. Minimal atelectasis at the left base laterally which is new. Lungs are otherwise clear.  IMPRESSION: Central line tip is in the proximal left innominate vein. Minimal new atelectasis at the left base laterally.   Electronically Signed   By: Rozetta Nunnery M.D.   On: 02/28/2014 10:13   Dg Chest Port 1 View  (if Code Sepsis Called)  02/28/2014   CLINICAL DATA:  Altered mental status.  Fever.  Shortness of breath.  EXAM: PORTABLE CHEST - 1 VIEW  COMPARISON:  11/17/2013  FINDINGS:  Cardiac enlargement with increasing pulmonary vascularity since previous study suggesting mild developing congestion. No edema or consolidation. Scarring in the right mid lung remains present. No blunting of costophrenic angles. No pneumothorax. Calcified and tortuous aorta.  IMPRESSION: Cardiac enlargement with mild vascular congestion. No edema or consolidation.   Electronically Signed   By: Lucienne Capers M.D.   On: 02/28/2014 05:47    Impression/Recommendation: UTI with mild left hydro--continue broad spectum IV ABx.  Urine culture is contaminated as it was collected from a dirty ostomy bag.  Hydro is mild and she does not appear obstructed.  No intervention necessary and recommend conservative measures at this time.  If she does not improve over the next few days may consider left perc tube.   Left renal stones, AML, and renal cysts are stable.   Marcie Bal 02/28/2014, 2:49 PM

## 2014-02-28 NOTE — Progress Notes (Signed)
Received call from Silas Flood at [facility] who reported that pt is growing gram negative rods in the aerobic blood culture bottle.  [place] notified.

## 2014-02-28 NOTE — Progress Notes (Signed)
UR completed 

## 2014-02-28 NOTE — ED Notes (Signed)
Patient spouse at bedside.

## 2014-02-28 NOTE — H&P (Signed)
PULMONARY / CRITICAL CARE MEDICINE   Name: [name] MRN: [id number] DOB: Jul 13, 1941    ADMISSION DATE:  02/28/2014  REFERRING MD :  EDP  CHIEF COMPLAINT:  Fever/hypotension  BRIEF PATIENT DESCRIPTION:  73 yo former smoker presented to ED with Fever 104F, altered mental status, and hypotension with elevated lactic acid likely from urosepsis.  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 She presents to [facility] ED 6/9 with fever 104, lactic acid >8, decreased loc, hypotension(responsive to 2 l fluid bolus) and PCCM asked to admit. She is a full code at this time.  PAST MEDICAL HISTORY :  Past Medical History  Diagnosis Date  . Cirrhosis 08/26/2011  . Colon cancer 2001  . Bladder cancer 2003  . Memory loss     d/t chemotherapy;short and long term;takes Aricept daily  . Thrombocytopenia   . Osteoporosis     takes Vit D3 daily  . Blood transfusion   . Arthritis   . Complication of anesthesia     confusion x 2 to 3 days  . Hypertension     takes Ramipril and Metoprolol daily  . History of blood clots 50+yrs ago    legs when she was pregnancy  . Headache(784.0)      occasionally  . Joint pain   . Joint swelling   . GERD (gastroesophageal reflux disease)     takes Protonix daily  . History of GI bleed   . Constipation   . Hemorrhoids   . History of colon polyps   . Anemia, iron deficiency     iron injection about 6wks ago  . Cataract immature   . Depression     takes Celexa daily  . Attention to urostomy     urostomy d/t hx bladder cancer  . Blood dyscrasia     thromboctopenia   Past Surgical History  Procedure Laterality Date  . Colon surgery  2004  . Total knee arthroplasty  2010    Left knee  . Colon surgery  2001  . Colostomy    . Bladder surgery      bladder removed d/t cancer  . Esophagogastroduodenoscopy  02/27/2012    Procedure: ESOPHAGOGASTRODUODENOSCOPY (EGD);  Surgeon: [name], MD;  Location: [name];  Service: Endoscopy;  Laterality: N/A;  bedside case  . Appendectomy    . Abdominal hysterectomy    . Cholecystectomy    . Colonoscopy     Prior to Admission medications   Medication Sig Start Date End Date Taking? Authorizing Provider  acetaminophen (TYLENOL) 500 MG tablet Take 1,000 mg by mouth every 6 (six) hours as needed for mild pain.    Historical Provider, MD  Cholecalciferol (VITAMIN D3) 1000 UNITS CAPS  Take 1 capsule by mouth daily.      Historical Provider, MD  Cinnamon 500 MG capsule Take 500 mg by mouth daily.     Historical Provider, MD  citalopram (CELEXA) 40 MG tablet Take 20 mg by mouth daily.    Historical Provider, MD  Cyanocobalamin (VITAMIN B 12 PO) Take by mouth every morning.    Historical Provider, MD  fexofenadine (ALLEGRA) 180 MG tablet Take 180 mg by mouth daily.    Historical Provider, MD  fish oil-omega-3 fatty acids 1000 MG capsule Take 2 g by mouth daily.      Historical Provider, MD  Ginkgo 60 MG TABS Take 60 mg by mouth every morning.    Historical Provider, MD  montelukast (SINGULAIR) 10 MG tablet Take 10 mg by mouth at bedtime.    Historical Provider, MD  Multiple Vitamin (MULTI  VITAMIN DAILY PO) Take by mouth every morning.    Historical Provider, MD  ramipril (ALTACE) 5 MG capsule Take 5 mg by mouth Daily. 08/19/11   Historical Provider, MD   Allergies  Allergen Reactions  . Codeine Swelling and Rash    Swelling in throat  . Morphine And Related Swelling and Rash  . Other Anaphylaxis    Bee Stings  . Promethazine Hcl     Per MD chart - 08/08/11  . Penicillins Rash    FAMILY HISTORY:  Family History  Problem Relation Age of Onset  . Diabetes Brother   . Cancer Other   . Hypertension Other    SOCIAL HISTORY:  reports that she quit smoking about 22 years ago. Her smoking use included Cigarettes. She started smoking about 58 years ago. She has a 17.5 pack-year smoking history. She has never used smokeless tobacco. She reports that she does not drink alcohol or use illicit drugs.  REVIEW OF SYSTEMS: NA  SUBJECTIVE:   VITAL SIGNS: Temp:  [102.8 F (39.3 C)-103.9 F (39.9 C)] 102.8 F (39.3 C) (06/09 0750) Pulse Rate:  [79-86] 84 (06/09 0827) Resp:  [20-36] 24 (06/09 0827) BP: (86-124)/(35-60) 103/60 mmHg (06/09 0827) SpO2:  [93 %-96 %] 94 % (06/09 0827) Weight:  [227 lb 1.2 oz (103 kg)] 227 lb 1.2 oz (103 kg) (06/09 0726) HEMODYNAMICS:    INTAKE / OUTPUT: Intake/Output     06/08 0701 - 06/09 0700 06/09 0701 - 06/10 0700   Urine (mL/kg/hr)  200 (0.7)   Total Output   200   Net   -200          PHYSICAL EXAMINATION: General:  WNWD agitated wf Neuro: Follows commands. Mae x 4 , no STM HEENT: PERL. No LAN/JVD/Dry oral mucosa Cardiovascular: HSR RRR Lungs:  CTA Abdomen: Soft + bs, rt flank urostomy bag with dirty urine Musculoskeletal:  Intact Skin:  Left arm with infiltrated IV sites  LABS:  CBC  Recent Labs Lab 02/28/14 0603  WBC 14.3*  HGB 13.4  HCT 39.7  PLT 41*   BMET  Recent Labs Lab 02/28/14 0603  NA 140  K 3.7  CL 101  CO2 19  BUN 17  CREATININE 1.12*  GLUCOSE 85   Electrolytes  Recent Labs Lab  02/28/14 0603  CALCIUM 9.4   Sepsis Markers  Recent Labs Lab 02/28/14 0603 02/28/14 0615  LATICACIDVEN 9.1* 8.75*   ABG  Recent Labs Lab 02/28/14 0823  PHART 7.312*  PCO2ART 33.6*  PO2ART 77.8*   Liver Enzymes  Recent Labs Lab 02/28/14 0603  AST 122*  ALT 61*  ALKPHOS 46  BILITOT 6.3*  ALBUMIN 2.7*   Imaging Ct Abdomen Pelvis W Contrast  02/28/2014   CLINICAL DATA:  Elevated LFTs and bilirubin  EXAM: CT ABDOMEN AND PELVIS WITH CONTRAST  TECHNIQUE: Multidetector CT imaging of the abdomen and pelvis was performed using the standard protocol following bolus administration of intravenous contrast.  CONTRAST:  135m OMNIPAQUE IOHEXOL 300 MG/ML  SOLN  COMPARISON:  Prior CT abdomen/ pelvis 01/16/2012; abdominal ultrasound 01/16/2014  FINDINGS: Lower Chest: Moderately large hiatal hernia. Numerous paraesophageal varices. Cardiomegaly. No pericardial effusion. Mild dependent atelectasis in the lower lungs. Small calcified granuloma in the periphery of the right lower lobe. No suspicious pulmonary nodule or mass.  Abdomen: Unremarkable CT appearance of the stomach, duodenum, adrenal glands and pancreas save for fatty atrophy. Marked splenomegaly without significant interval change. No discrete splenic lesion. Abnormal appearance of the liver with cirrhotic morphology. The hepatic veins remain patent. 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 Thrombus is present at the porta splenic confluence and within the main right portal vein. The left and right branches of the portal vein a relatively atrophic compared to prior. Diffusely heterogeneous appearance of the liver. There is an ill-defined, amorphous region of hypoattenuation centrally within hepatic segments 8/5 which measures approximately 5.2 x 5.0 x 4.2 cm. Measurements are approximate due to the amorphous nature of the abnormality. There is no  discrete enhancement and internal Hounsfield units are very low suggesting severe geographic fatty infiltration. Peripherally, the hepatic parenchyma is also very heterogeneous with areas of fatty infiltration of varying severity.  Interval change in the appearance of the left kidney. There is mild hydronephrosis with diffuse thickening and enhancement of the urothelium high attenuation within the left upper pole collecting system may reflect early excretion of contrast material or interval development of nephrolithiasis. No definite stone within the UPJ yellow other is abrupt transition of the dilated and irregular renal pelvis to normal caliber ureter. The ureter at the UPJ is slightly full with enhancing soft tissue which represents enhancing soft tissue versus does collapsed and apposed enhancing thickened urothelium. Prior surgical changes of right hemicolectomy and cystectomy with right lower quadrant diverting ileostomy.  Stable renal cysts in the left kidney without significant interval change. Unremarkable appearance of the right kidney. No hydronephrosis. Small locules of air within the renal collecting system are not unexpected in the setting of ileal diversion.  Surgical changes of prior right hemicolectomy. Colocolonic anastomosis in the left lower quadrant at the sigmoid colon, enteroenteric anastomosis in the mid abdomen and ileocolonic anastomosis in the right lower quadrant are all patent. No evidence of obstruction. Small volume of mesenteric stranding and nonloculated ascites. Multiple loops of small bowel are closely apposed to the undersurface of the anterior abdominal wall consistent with surgical adhesive disease.  Pelvis: Surgical changes of prior cystectomy and hysterectomy. Marked pelvic floor laxity. No suspicious adenopathy.  Bones/Soft Tissues: Similar appearance of fenestrated anterior abdominal wall with multiple ventral abdominal hernias containing omental fat and a small amounts of  fluid. No acute fracture or aggressive appearing lytic or blastic osseous lesion.  Vascular: Atherosclerotic vascular disease without significant stenosis or aneurysmal dilatation.  IMPRESSION: 1. CT findings are most consistent with obstructed pyonephrosis involving the left kidney. There is mild hydronephrosis with diffuse mild thickening and enhancement of the urothelium concerning for superimposed infection. Focal narrowing and soft tissue enhancement at the UPJ which could represent a small obstructing urothelial neoplasm,  or simply apposition of the normal enhancing urothelium at a point of stenosis. No definite obstructing stone. 2. Amorphous hypo attenuating mass like lesion within the central aspect of the right hemi liver is nonspecific but favored to reflect a pseudo mass related to geographic fatty infiltration of the liver. However, given the patient's prior history of multiple malignancies and the background of hepatic cirrhosis, both primary hepatocellular carcinoma and metastasis are difficult to exclude entirely. Recommend further evaluation with nonemergent (when the patient is well enough to cooperate and breath hold) MRI of the abdomen with and without contrast. 3. Hepatic cirrhosis, hepatic steatosis and portal hypertension with evidence of esophageal varices. 4. Slight interval progression of nonocclusive portal vein thrombus compared to April of 2013. The right and left main portal veins are atretic. 5. Mesenteric edema and a small volume ascites likely related to underlying portal hypertension. 6. Similar appearance of a fenestrated ventral anterior abdominal wall with multiple omental fat containing hernias. 7. Evidence of intra-abdominal postsurgical adhesive disease without obstruction. 8. Moderately large hiatal hernia. 9. Nonobstructing stones in the upper pole of the left kidney. 10. Surgical changes of prior right hemi colectomy and cystectomy with patent colocolonic and enteroenteric  anastomoses and right lower quadrant diverting ileostomy. 11. Additional ancillary findings as above.   Electronically Signed   By: Jacqulynn Cadet M.D.   On: 02/28/2014 08:44   Dg Chest Port 1 View  (if Code Sepsis Called)  02/28/2014   CLINICAL DATA:  Altered mental status.  Fever.  Shortness of breath.  EXAM: PORTABLE CHEST - 1 VIEW  COMPARISON:  11/17/2013  FINDINGS: Cardiac enlargement with increasing pulmonary vascularity since previous study suggesting mild developing congestion. No edema or consolidation. Scarring in the right mid lung remains present. No blunting of costophrenic angles. No pneumothorax. Calcified and tortuous aorta.  IMPRESSION: Cardiac enlargement with mild vascular congestion. No edema or consolidation.   Electronically Signed   By: Lucienne Capers M.D.   On: 02/28/2014 05:47    ASSESSMENT / PLAN:  PULMONARY A:  Atelectasis. P:   Oxygen as needed to keep SpO2 > 92% Bronchial hygiene F/u CXR as needed  CARDIOVASCULAR A:   Severe sepsis 2nd to UTI. Hx of HTN. P:  Hydration  DC antihypertensives Pressors as needed to keep MAP > 65 Monitor CVP's  RENAL A:  AKI (baseline creatinine 0.8) >> 2nd to Lt hydronephrosis and UTI. Hx of bladder ca with urostomy. P:   Monitor renal fx Consult urology  GASTROINTESTINAL A:   Hx of GIB, NASH with cirrhosis and varices. P:   Protonix q12h NPO for now  HEMATOLOGIC A:   Chronic iron deficiency anemia, leukopenia/thrombocytopenia >> followed by Dr. [name] as outpt. P:  F/u CBC SQ heparin for DVT prevention  INFECTIOUS A:   Severe sepsis 2nd to UTI.  Hx of PCN allergy. P:   Continue vancomycin, cefepime F/u procalcitonin, lactic acid, Cx's  ENDOCRINE A:   No acute issues P:   F/u cortisol level  NEUROLOGIC A:  Acute encephalopathy 2nd to sepsis and AKI. Hx of dementia. 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 She presents to [facility] ED 6/9 with fever 104, decreased loc, hypotension(responsive to 2 l  fluid bolus) and PCCM asked to admit. She is a full code at this time.  Pulmonary and [place] Pager: [phone]  02/28/2014, 7:31 AM  Reviewed above, examined, and documentation changes made as needed.  73 yo female with urosepsis and Lt hydronephrosis.  Will consult urology.  Continue Abx.  Continue IV fluids >> may need pressors.  Hx of severe dementia >> husband request aggressive measures.  Depending on her progress may need to further address goals of care.  CC time 50 minutes.  Chesley Mires, MD [facility] Pulmonary/Critical Care 02/28/2014, 10:10 AM Pager:  [phone] After 3pm call: [phone]

## 2014-02-28 NOTE — ED Notes (Signed)
CT notified patient should be seen STAT. Confirmed with CT patient is to have abd and should be bumped to top of list. Requested CT to use patient L hand IV site.

## 2014-02-28 NOTE — Progress Notes (Signed)
[place] Progress Note Patient Name: [name] DOB: 1941-04-04 MRN: [id number]  Date of Service  02/28/2014   HPI/Events of Note  Trop 1.36   eICU Interventions  Just off pressors, unable to gove bb, no hep, asa plat low echo   Intervention Category Intermediate Interventions: Pain - evaluation and management  Raylene Miyamoto 02/28/2014, 10:36 PM

## 2014-02-28 NOTE — Progress Notes (Signed)
ANTIBIOTIC CONSULT NOTE - INITIAL  Pharmacy Consult for Cefepime and vancomycin Indication: Sepsis, wound infection  Allergies  Allergen Reactions  . Codeine Swelling and Rash    Swelling in throat  . Morphine And Related Swelling and Rash  . Other Anaphylaxis    Bee Stings  . Promethazine Hcl     Per MD chart - 08/08/11  . Penicillins Rash    Patient Measurements: Height: 5' 6.14" (168 cm) Weight: 227 lb 1.2 oz (103 kg) IBW/kg (Calculated) : 59.63 Adjusted Body Weight: 77kg  Vital Signs: Temp: 102.8 F (39.3 C) (06/09 0750) Temp src: Rectal (06/09 0750) BP: 94/36 mmHg (06/09 0700) Pulse Rate: 84 (06/09 0700) Intake/Output from previous day:   Intake/Output from this shift: Total I/O In: -  Out: 200 [Urine:200]  Labs:  Recent Labs  02/28/14 0603  WBC 14.3*  HGB 13.4  PLT 41*  CREATININE 1.12*   Estimated Creatinine Clearance: 53.6 ml/min (by C-G formula based on Cr of 1.12). No results found for this basename: VANCOTROUGH, VANCOPEAK, VANCORANDOM, GENTTROUGH, GENTPEAK, GENTRANDOM, TOBRATROUGH, TOBRAPEAK, TOBRARND, AMIKACINPEAK, AMIKACINTROU, AMIKACIN,  in the last 72 hours   Microbiology: No results found for this or any previous visit (from the past 720 hour(s)).  Medical History: Past Medical History  Diagnosis Date  . Cirrhosis 08/26/2011  . Colon cancer 2001  . Bladder cancer 2003  . Memory loss     d/t chemotherapy;short and long term;takes Aricept daily  . Thrombocytopenia   . Osteoporosis     takes Vit D3 daily  . Blood transfusion   . Arthritis   . Complication of anesthesia     confusion x 2 to 3 days  . Hypertension     takes Ramipril and Metoprolol daily  . History of blood clots 50+yrs ago    legs when she was pregnancy  . Headache(784.0)     occasionally  . Joint pain   . Joint swelling   . GERD (gastroesophageal reflux disease)     takes Protonix daily  . History of GI bleed   . Constipation   . Hemorrhoids   . History of  colon polyps   . Anemia, iron deficiency     iron injection about 6wks ago  . Cataract immature   . Depression     takes Celexa daily  . Attention to urostomy     urostomy d/t hx bladder cancer  . Blood dyscrasia     thromboctopenia    Medications:  Anti-infectives   Start     Dose/Rate Route Frequency Ordered Stop   02/28/14 1800  ceFEPIme (MAXIPIME) 1 g in dextrose 5 % 50 mL IVPB     1 g 100 mL/hr over 30 Minutes Intravenous Every 12 hours 02/28/14 0729     02/28/14 0700  vancomycin (VANCOCIN) 1,500 mg in sodium chloride 0.9 % 500 mL IVPB     1,500 mg 250 mL/hr over 120 Minutes Intravenous  Once 02/28/14 0645     02/28/14 0645  vancomycin (VANCOCIN) 15 mg/kg in sodium chloride 0.9 % 100 mL IVPB  Status:  Discontinued     15 mg/kg 100 mL/hr over 60 Minutes Intravenous  Once 02/28/14 0639 02/28/14 0644   02/28/14 0600  ceFEPIme (MAXIPIME) 2 g in dextrose 5 % 50 mL IVPB     2 g 100 mL/hr over 30 Minutes Intravenous  Once 02/28/14 0551 02/28/14 5208     Assessment: 73 year old female with colon cancer, bladder cancer, dementia, cirrhosis nonalcoholic, ventral hernia,  anemia, varices, past smoker presents to the ER by EMS for worsening altered mental status and fever. Reportedly patient's had gradually worsening confusion and altered for the past 3 days. Has R-sided urostomy. Pharmacy is asked to dose Cefepime and vancomycin for sepsis and wound infection.    6/9 >> cefepime >> 6/9 >> vancomycin >>  Tmax: 103.9 WBCs: 14.3 Renal: SCr 1.12, CrCl ~50(N)  Goal of Therapy:  Vancomycin trough level 15-20 mcg/ml Appropriate antibiotic dosing for renal function; eradication of infection  Plan:   Cefepime 2g x 1 then 1g IV q12h.  Vancomycin 1.5g IV q24h.  Measure Vanc trough at steady state.  Follow up renal fxn and culture results.  Romeo Rabon, PharmD, pager [phone]. 02/28/2014,8:28 AM.

## 2014-02-28 NOTE — Progress Notes (Signed)
[place] Progress Note Patient Name: [name] DOB: Oct 30, 1940 MRN: [id number]  Date of Service  02/28/2014   HPI/Events of Note   anxiety  eICU Interventions  Limit when able benxo  Xanax x 1   Intervention Category Minor Interventions: Agitation / anxiety - evaluation and management  Raylene Miyamoto 02/28/2014, 10:57 PM

## 2014-02-28 NOTE — Consult Note (Signed)
I have seen and examined the patient, I agree with the above findings.  Patient does not appear to be obstructed, but does have thickened urothelium with an edematous kidney consistent with pyelitis/pyelonephritis.  She was hypotensive requiring pressors and likely was bacteremic.  Culture taken from her urostomy bag is unfortunately the best urine culture that we have, but blood cultures may be more helpful in guiding abx therapy.  If she does not improve over the next 24-48 hours we may need to decompress her left upper tract in some way.  We will continue to follow this patient.

## 2014-02-28 NOTE — ED Notes (Signed)
Bed: YG47 Expected date:  Expected time:  Means of arrival:  Comments: EMS 73yo F, altered LOC. ? UTI sx; ? febrile

## 2014-02-28 NOTE — ED Provider Notes (Signed)
CSN: [id number]     Arrival date & time 02/28/14  0504 History   First MD Initiated Contact with Patient 02/28/14 [phone]     Chief Complaint  Patient presents with  . Altered Mental Status     (Consider location/radiation/quality/duration/timing/severity/associated sxs/prior Treatment) HPI Comments: 73 year old female with colon cancer, bladder cancer, dementia, cirrhosis nonalcoholic, ventral hernia, anemia, varices, past smoker presents to the ER by EMS for worsening altered mental status and fever. Reportedly patient's had gradually worsening confusion and altered for the past 3 days. Patient has a urostomy right side. No known current antibiotics. No family at bedside. Unknown if patient has active chemotherapy at this time. No recent hospitalizations in this system. Unable to obtain details from patient due to confusion altered mental status.  Patient is a 73 y.o. female presenting with altered mental status. The history is provided by medical records and the EMS personnel.  Altered Mental Status Associated symptoms: fever     Past Medical History  Diagnosis Date  . Cirrhosis 08/26/2011  . Colon cancer 2001  . Bladder cancer 2003  . Memory loss     d/t chemotherapy;short and long term;takes Aricept daily  . Thrombocytopenia   . Osteoporosis     takes Vit D3 daily  . Blood transfusion   . Arthritis   . Complication of anesthesia     confusion x 2 to 3 days  . Hypertension     takes Ramipril and Metoprolol daily  . History of blood clots 50+yrs ago    legs when she was pregnancy  . Headache(784.0)     occasionally  . Joint pain   . Joint swelling   . GERD (gastroesophageal reflux disease)     takes Protonix daily  . History of GI bleed   . Constipation   . Hemorrhoids   . History of colon polyps   . Anemia, iron deficiency     iron injection about 6wks ago  . Cataract immature   . Depression     takes Celexa daily  . Attention to urostomy     urostomy d/t hx bladder  cancer  . Blood dyscrasia     thromboctopenia   Past Surgical History  Procedure Laterality Date  . Colon surgery  2004  . Total knee arthroplasty  2010    Left knee  . Colon surgery  2001  . Colostomy    . Bladder surgery      bladder removed d/t cancer  . Esophagogastroduodenoscopy  02/27/2012    Procedure: ESOPHAGOGASTRODUODENOSCOPY (EGD);  Surgeon: [name], MD;  Location: [name];  Service: Endoscopy;  Laterality: N/A;  bedside case  . Appendectomy    . Abdominal hysterectomy    . Cholecystectomy    . Colonoscopy     Family History  Problem Relation Age of Onset  . Diabetes Brother   . Cancer Other   . Hypertension Other    History  Substance Use Topics  . Smoking status: Former Smoker -- 0.50 packs/day for 35 years    Types: Cigarettes    Start date: 10/25/1955    Quit date: 09/22/1991  . Smokeless tobacco: Never Used     Comment: quit 23years ago  . Alcohol Use: No   OB History   Grav Para Term Preterm Abortions TAB SAB Ect Mult Living                 Review of Systems  Unable to perform ROS: Mental status change  Constitutional: Positive for fever.      Allergies  Codeine; Morphine and related; Other; Promethazine hcl; and Penicillins  Home Medications   Prior to Admission medications   Medication Sig Start Date End Date Taking? Authorizing Provider  acetaminophen (TYLENOL) 500 MG tablet Take 1,000 mg by mouth every 6 (six) hours as needed for mild pain.    Historical Provider, MD  Cholecalciferol (VITAMIN D3) 1000 UNITS CAPS Take 1 capsule by mouth daily.      Historical Provider, MD  Cinnamon 500 MG capsule Take 500 mg by mouth daily.     Historical Provider, MD  citalopram (CELEXA) 40 MG tablet Take 20 mg by mouth daily.    Historical Provider, MD  Cyanocobalamin (VITAMIN B 12 PO) Take by mouth every morning.    Historical Provider, MD  fexofenadine (ALLEGRA) 180 MG tablet Take 180 mg by mouth daily.    Historical Provider, MD  fish  oil-omega-3 fatty acids 1000 MG capsule Take 2 g by mouth daily.      Historical Provider, MD  Ginkgo 60 MG TABS Take 60 mg by mouth every morning.    Historical Provider, MD  montelukast (SINGULAIR) 10 MG tablet Take 10 mg by mouth at bedtime.    Historical Provider, MD  Multiple Vitamin (MULTI VITAMIN DAILY PO) Take by mouth every morning.    Historical Provider, MD  ramipril (ALTACE) 5 MG capsule Take 5 mg by mouth Daily. 08/19/11   Historical Provider, MD   BP 86/45  Pulse 79  Temp(Src) 103.9 F (39.9 C) (Rectal)  Resp 20  SpO2 95% Physical Exam  Nursing note and vitals reviewed. Constitutional: She appears well-developed and well-nourished.  HENT:  Head: Normocephalic and atraumatic.  Extremely dry mucous membranes  Eyes: Right eye exhibits no discharge. Left eye exhibits no discharge.  Neck: Normal range of motion. Neck supple. No tracheal deviation present.  Cardiovascular: Normal rate and regular rhythm.   Pulmonary/Chest: Effort normal and breath sounds normal.  Abdominal: Soft. She exhibits no distension. There is tenderness (mild diffuse). There is no guarding.  Urostomy right mid abdomen  Musculoskeletal: She exhibits no edema.  Neurological: She is alert. GCS eye subscore is 4. GCS verbal subscore is 4. GCS motor subscore is 5.  Patient will follow commands with repeated verbal. Neck supple, general confusion. Patient is not a location or date of birth. Patient does not know why she's here. Intermittent answers that are not making clinical sense. Patient moves extremities equal bilateral gross sensation intact. Pupils equal bilateral. Horizontal eye movements intact  Skin: Skin is warm. No rash noted.  Psychiatric: She has a normal mood and affect.    ED Course  Procedures (including critical care time) CRITICAL CARE Performed by: Mariea Clonts   Total critical care time: 70 min  Critical care time was exclusive of separately billable procedures and treating  other patients.  Critical care was necessary to treat or prevent imminent or life-threatening deterioration.  Critical care was time spent personally by me on the following activities: development of treatment plan with patient and/or surrogate as well as nursing, discussions with consultants, evaluation of patient's response to treatment, examination of patient, obtaining history from patient or surrogate, ordering and performing treatments and interventions, ordering and review of laboratory studies, ordering and review of radiographic studies, pulse oximetry and re-evaluation of patient's condition.  Labs Review Labs Reviewed  LACTIC ACID, PLASMA - Abnormal; Notable for the following:    Lactic Acid, Venous 9.1 (*)  All other components within normal limits  CBC - Abnormal; Notable for the following:    WBC 14.3 (*)    RDW 16.8 (*)    Platelets 41 (*)    All other components within normal limits  COMPREHENSIVE METABOLIC PANEL - Abnormal; Notable for the following:    Creatinine, Ser 1.12 (*)    Total Protein 5.5 (*)    Albumin 2.7 (*)    AST 122 (*)    ALT 61 (*)    Total Bilirubin 6.3 (*)    GFR calc non Af Amer 47 (*)    GFR calc Af Amer 55 (*)    All other components within normal limits  URINALYSIS, ROUTINE W REFLEX MICROSCOPIC - Abnormal; Notable for the following:    Color, Urine ORANGE (*)    APPearance CLOUDY (*)    Hgb urine dipstick MODERATE (*)    Bilirubin Urine SMALL (*)    Protein, ur 30 (*)    Leukocytes, UA SMALL (*)    All other components within normal limits  URINE MICROSCOPIC-ADD ON - Abnormal; Notable for the following:    Bacteria, UA MANY (*)    All other components within normal limits  I-STAT CG4 LACTIC ACID, ED - Abnormal; Notable for the following:    Lactic Acid, Venous 8.75 (*)    All other components within normal limits  CULTURE, BLOOD (ROUTINE X 2)  CULTURE, BLOOD (ROUTINE X 2)  URINE CULTURE  CULTURE, EXPECTORATED SPUTUM-ASSESSMENT   PROTIME-INR  CORTISOL  TROPONIN I  APTT  FIBRINOGEN  BLOOD GAS, ARTERIAL  TYPE AND SCREEN    Imaging Review Dg Chest Port 1 View  (if Code Sepsis Called)  02/28/2014   CLINICAL DATA:  Altered mental status.  Fever.  Shortness of breath.  EXAM: PORTABLE CHEST - 1 VIEW  COMPARISON:  11/17/2013  FINDINGS: Cardiac enlargement with increasing pulmonary vascularity since previous study suggesting mild developing congestion. No edema or consolidation. Scarring in the right mid lung remains present. No blunting of costophrenic angles. No pneumothorax. Calcified and tortuous aorta.  IMPRESSION: Cardiac enlargement with mild vascular congestion. No edema or consolidation.   Electronically Signed   By: Lucienne Capers M.D.   On: 02/28/2014 05:47     EKG Interpretation None      MDM   Final diagnoses:  Altered mental state  Severe sepsis  Hypotension  UTI (lower urinary tract infection)  Abdominal pain  Lactic acidosis  Elevated bilirubin  LFT elevation   Patient presented clinically sepsis with fever 103.9, confusion and hypotension in the 80s. Sepsis workup initiated immediately brought antibiotics, cultures, fluid bolus, 2 IVs in lab work. Husband on route to help provide further detail. Concern for urine infection is likely source however results pending. Cefepime ordered  Lactic acid returned significantly elevated 8. Patient's blood pressure improved with 2 L fluid bolus and currently 510 systolic. Patient generally confused on exam currently feels she's protecting her airway. I long discussion with the husband arrived to explain she's had gradual worsening symptoms the past 48 hours and recent vomiting with fever. She is a full code I discussed if she continued to worsen she may need intubation which he was okay with. Spoke with critical care who will come and assess and assist with her care. CT abdomen ordered with fever, altered mental status and bilirubin of 6 with LFT elevation.  Concern for her abdominal infection/descending colonangitis/ gallbladder versus other.  Pt improved on multiple rechecks with fluids/ abx.  CT pending, CC at bedside The patients results and plan were reviewed and discussed.   Any x-rays performed were personally reviewed by myself.   Differential diagnosis were considered with the presenting HPI.  Medications  sodium chloride 0.9 % bolus 1,000 mL (not administered)  acetaminophen (TYLENOL) suppository 975 mg (not administered)  sodium chloride 0.9 % bolus 1,000 mL (not administered)  ceFEPIme (MAXIPIME) 2 g in dextrose 5 % 50 mL IVPB (not administered)    Filed Vitals:   02/28/14 0504 02/28/14 0530  BP:  86/45  Pulse:  79  Temp:  103.9 F (39.9 C)  TempSrc:  Rectal  Resp:  20  SpO2: 96% 95%    Admission/ observation were discussed with the admitting physician, patient and/or family and they are comfortable with the plan.      Mariea Clonts, MD 02/28/14 [phone]

## 2014-02-28 NOTE — Progress Notes (Signed)
[facility] Progress Note Patient Name: [name] DOB: 1941-08-09 MRN: [id number]  Date of Service  02/28/2014   HPI/Events of Note  Trop noted, plat low   eICU Interventions  No empiric asa as plat low Repeat trop   Intervention Category Intermediate Interventions: Arrhythmia - evaluation and management  Raylene Miyamoto 02/28/2014, 8:05 PM

## 2014-02-28 NOTE — ED Notes (Addendum)
Patient husband reports patient has been more confused and not acting normal for the past @ 1-1/2 days. Patient has baseline memory issues. Patient confused in ER and requires constant reminders to not pull at equipment and lines. Patient is currently left side lying and resting quietly at this time. Bedrails padded for patient safety.

## 2014-02-28 NOTE — ED Notes (Signed)
Initial Contact - pt ret from CT at this time.  LUE IV angio infiltrated and d/c'd at this time.  Vanco infusing w/o issue via L hand IV angio.  Pt rolling around on stretcher, repeatedly saying "help me", but then denies needs.  Pt denies pain, denies CP/SOB.  Denies toileting needs.  Speaking full sentences, rr even/un-lab, lsctab, dim throughout.  Maintained on 3L [place] at this time.  +bsx4 quads.  Abd s/nt/nd, obese.  Urostomy present to RLQ.  Pt denies n/v/d/c.  Skin p/hot/dry.  MAEI, +csm/+pulses.  Intensivist at bedside for eval at this time.  Per provider, he will be placing a central line when pt gets to ICU, no need to obtain addt'l peripheral line at this time.       

## 2014-03-01 ENCOUNTER — Inpatient Hospital Stay (HOSPITAL_COMMUNITY): Payer: Medicare Other

## 2014-03-01 DIAGNOSIS — I369 Nonrheumatic tricuspid valve disorder, unspecified: Secondary | ICD-10-CM

## 2014-03-01 DIAGNOSIS — Z85038 Personal history of other malignant neoplasm of large intestine: Secondary | ICD-10-CM

## 2014-03-01 DIAGNOSIS — R4182 Altered mental status, unspecified: Secondary | ICD-10-CM | POA: Diagnosis not present

## 2014-03-01 DIAGNOSIS — Z8551 Personal history of malignant neoplasm of bladder: Secondary | ICD-10-CM

## 2014-03-01 DIAGNOSIS — A419 Sepsis, unspecified organism: Secondary | ICD-10-CM | POA: Diagnosis not present

## 2014-03-01 DIAGNOSIS — F0390 Unspecified dementia without behavioral disturbance: Secondary | ICD-10-CM | POA: Diagnosis not present

## 2014-03-01 DIAGNOSIS — D696 Thrombocytopenia, unspecified: Secondary | ICD-10-CM | POA: Diagnosis not present

## 2014-03-01 DIAGNOSIS — R509 Fever, unspecified: Secondary | ICD-10-CM | POA: Diagnosis not present

## 2014-03-01 DIAGNOSIS — R791 Abnormal coagulation profile: Secondary | ICD-10-CM

## 2014-03-01 DIAGNOSIS — N1330 Unspecified hydronephrosis: Secondary | ICD-10-CM | POA: Diagnosis not present

## 2014-03-01 DIAGNOSIS — K7689 Other specified diseases of liver: Secondary | ICD-10-CM

## 2014-03-01 DIAGNOSIS — N390 Urinary tract infection, site not specified: Secondary | ICD-10-CM | POA: Diagnosis not present

## 2014-03-01 DIAGNOSIS — K7460 Unspecified cirrhosis of liver: Secondary | ICD-10-CM | POA: Diagnosis not present

## 2014-03-01 DIAGNOSIS — A4150 Gram-negative sepsis, unspecified: Secondary | ICD-10-CM | POA: Diagnosis not present

## 2014-03-01 DIAGNOSIS — N200 Calculus of kidney: Secondary | ICD-10-CM | POA: Diagnosis not present

## 2014-03-01 DIAGNOSIS — R161 Splenomegaly, not elsewhere classified: Secondary | ICD-10-CM

## 2014-03-01 DIAGNOSIS — D509 Iron deficiency anemia, unspecified: Secondary | ICD-10-CM

## 2014-03-01 DIAGNOSIS — J9819 Other pulmonary collapse: Secondary | ICD-10-CM | POA: Diagnosis not present

## 2014-03-01 LAB — BASIC METABOLIC PANEL
BUN: 33 mg/dL — AB (ref 6–23)
CHLORIDE: 105 meq/L (ref 96–112)
CO2: 20 mEq/L (ref 19–32)
Calcium: 7.9 mg/dL — ABNORMAL LOW (ref 8.4–10.5)
Creatinine, Ser: 1.12 mg/dL — ABNORMAL HIGH (ref 0.50–1.10)
GFR calc non Af Amer: 47 mL/min — ABNORMAL LOW (ref >90)
GFR, EST AFRICAN AMERICAN: 55 mL/min — AB (ref >90)
Glucose, Bld: 81 mg/dL (ref 70–99)
POTASSIUM: 4 meq/L (ref 3.7–5.3)
Sodium: 142 mEq/L (ref 137–147)

## 2014-03-01 LAB — CBC
HEMATOCRIT: 35 % — AB (ref 36.0–46.0)
Hemoglobin: 12 g/dL (ref 12.0–15.0)
MCH: 30.8 pg (ref 26.0–34.0)
MCHC: 34.3 g/dL (ref 30.0–36.0)
MCV: 89.7 fL (ref 78.0–100.0)
PLATELETS: 16 10*3/uL — AB (ref 150–400)
RBC: 3.9 MIL/uL (ref 3.87–5.11)
RDW: 17.8 % — ABNORMAL HIGH (ref 11.5–15.5)
WBC: 20.9 10*3/uL — AB (ref 4.0–10.5)

## 2014-03-01 LAB — HEPATIC FUNCTION PANEL
ALK PHOS: 54 U/L (ref 39–117)
ALT: 56 U/L — AB (ref 0–35)
AST: 99 U/L — AB (ref 0–37)
Albumin: 2.4 g/dL — ABNORMAL LOW (ref 3.5–5.2)
Bilirubin, Direct: 3.2 mg/dL — ABNORMAL HIGH (ref 0.0–0.3)
Indirect Bilirubin: 1.6 mg/dL — ABNORMAL HIGH (ref 0.3–0.9)
TOTAL PROTEIN: 5 g/dL — AB (ref 6.0–8.3)
Total Bilirubin: 4.8 mg/dL — ABNORMAL HIGH (ref 0.3–1.2)

## 2014-03-01 LAB — SAVE SMEAR

## 2014-03-01 LAB — CARBOXYHEMOGLOBIN
Carboxyhemoglobin: 1.8 % — ABNORMAL HIGH (ref 0.5–1.5)
Methemoglobin: 1.4 % (ref 0.0–1.5)
O2 Saturation: 72.2 %
TOTAL HEMOGLOBIN: 12 g/dL (ref 12.0–16.0)

## 2014-03-01 LAB — TROPONIN I
Troponin I: 2.65 ng/mL (ref <0.30)
Troponin I: 3.34 ng/mL (ref <0.30)

## 2014-03-01 LAB — PHOSPHORUS: Phosphorus: 4.5 mg/dL (ref 2.3–4.6)

## 2014-03-01 LAB — MAGNESIUM: Magnesium: 1.2 mg/dL — ABNORMAL LOW (ref 1.5–2.5)

## 2014-03-01 LAB — LACTIC ACID, PLASMA: LACTIC ACID, VENOUS: 3.4 mmol/L — AB (ref 0.5–2.2)

## 2014-03-01 LAB — PROCALCITONIN: PROCALCITONIN: 111.41 ng/mL

## 2014-03-01 MED ORDER — VITAMIN K1 10 MG/ML IJ SOLN
10.0000 mg | Freq: Every day | INTRAMUSCULAR | Status: AC
  Administered 2014-03-01 – 2014-03-03 (×3): 10 mg via SUBCUTANEOUS
  Filled 2014-03-01 (×3): qty 1

## 2014-03-01 MED ORDER — MAGNESIUM SULFATE 40 MG/ML IJ SOLN
2.0000 g | Freq: Once | INTRAMUSCULAR | Status: AC
  Administered 2014-03-01: 2 g via INTRAVENOUS
  Filled 2014-03-01: qty 50

## 2014-03-01 MED ORDER — PANTOPRAZOLE SODIUM 40 MG PO TBEC
40.0000 mg | DELAYED_RELEASE_TABLET | Freq: Two times a day (BID) | ORAL | Status: DC
  Administered 2014-03-01 – 2014-03-07 (×12): 40 mg via ORAL
  Filled 2014-03-01 (×14): qty 1

## 2014-03-01 MED ORDER — DEXTROSE 5 % IV SOLN
2.0000 g | Freq: Two times a day (BID) | INTRAVENOUS | Status: DC
  Administered 2014-03-01 – 2014-03-02 (×2): 2 g via INTRAVENOUS
  Filled 2014-03-01 (×2): qty 2

## 2014-03-01 NOTE — Consult Note (Signed)
[name], [name]             ACCOUNT NO.:  [id number]  MEDICAL RECORD NO.:  [id number]  LOCATION:  5176                         FACILITY:  [facility]  PHYSICIAN:  [name], M.D.  DATE OF BIRTH:  1940-11-25  DATE OF CONSULTATION:  03/01/2014 DATE OF DISCHARGE:                                CONSULTATION   REASON FOR CONSULTATION: 1. Thrombocytopenia. 2. History of colon cancer and bladder cancer. 3. Nonalcoholic steatohepatitis with splenomegaly.  HISTORY OF PRESENT ILLNESS:  The patient is very well known to me.  She is a very nice 73 year old white female.  She has a past history of stage II colon cancer.  This was treated probably about 10 years ago. She also has a past history of stage IV bladder cancer.  She had her bladder resected.  She had adjuvant chemotherapy.  This probably was about 12 years ago.  She has very bad dementia.  We see her because of her recurrent anemia and iron deficiency.  She does have thrombocytopenia.  She has cirrhosis secondary to NASH.  She has splenomegaly.  She was admitted on February 28, 2014, with sepsis.  Cultures were growing gram-negative rods in her blood.  Upon admission, her CBC showed white cell count 14.3, hemoglobin 13.4, hematocrit 39.7, platelet count 41,000.  When we saw her in office, her hemoglobin was 11.9, hematocrit 35.6. Platelet count 73,000.  We did go ahead and give her a dose of IV iron. She responds to iron.  She has had no bleeding.  She is on antibiotics right now.  She seems to be improving according to her daughter.  Today, her platelet count is down to 16,000.  White cell count 20.9 and hemoglobin 12, hematocrit 35.  Her electrolytes show a sodium 142, potassium 3.4, BUN 33, creatinine 1.12.  Her liver function tests are slightly elevated.  Albumin is 2.4. Total bilirubin is 4.8.  PAST MEDICAL HISTORY:  Her past medical history is well documented.  She has a history of stage II colon cancer.  She has  a history of stage IV bladder cancer.  These are both cured from my point of view.  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  She has recurrent iron deficiency anemia.  There is a history of dementia.  ALLERGIES: 1. Codeine. 2. Morphine. 3. Penicillin. 4. Compazine.  CURRENT MEDICATIONS: 1. Xanax 0.25 mg x1 dose. 2. Maxipime 1 g IV q.12 hours. 3. Protonix 40 mg IV q.12 hours. 4. Vancomycin 1500 mg IV q.24 hours. 5. Ultram 50 mg p.o. q.6 hours p.r.n.  SOCIAL HISTORY:  Negative for tobacco or alcohol use.  PHYSICAL EXAMINATION:  This is an elderly white female.  She is somewhat alert.  Vital Signs:  Temperature of 98, pulse 72, respiratory rate 20, blood pressure 113/50.  Head and neck exam shows no ocular or oral lesions.  There are no palpable cervical or supraclavicular lymph nodes. Lungs:  Clear bilaterally.  Cardiac:  Regular rate and rhythm with a normal S1 and S2.  There are no murmurs, rubs, or bruits.  Abdomen: Soft.  She has urostomy in the right lower quadrant.  There is no fluid wave.  There is no  guarding or rebound tenderness.  She has no palpable hepatomegaly.  Spleen tip maybe palpable with deep inspiration. Extremities show no clubbing, cyanosis, or edema.  She has decent range of motion of her joints.  Skin exam shows an area of ecchymoses on the left upper arm.  Neurological exam shows the dementia.  She did have a CT scan done.  It appeared that she had pyonephrosis of the left kidney.  She has changes of cirrhosis.  There was a portal hypertension with some esophageal varices.  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  She had enlarged hiatal hernia.  There are nonobstructing stones in the left kidney.  IMPRESSION:  The patient is a very nice 72 year old white female.  She has a past history of 2 malignancies.  She is cured of these.  She has a cirrhosis with some splenomegaly.  She has chronic thrombocytopenia. She has anemia  secondary to iron deficiency.  Her platelet count certainly is trending downward.  Of course, no blood smear has been made.  We will have to get a blood smear made.  However, I believe that the progressive thrombocytopenia is all reflective of her underlying infection.  She has gram-negative rods in her blood.  This certainly is the likely etiology for the progressive thrombocytopenia.  Her PT and PTT are somewhat elevated.  She may have some degree of DIC. Her fibrinogen is 179.  I would not transfuse platelets right now.  Despite the thrombocytopenia, her platelets are pretty functional.  She has no problems making platelets.  It is just that the platelets are sequestered in her spleen.  There is also some degree of platelet destruction with this gram-negative rod bacteremia.  It was nice to see her.  She actually looks pretty good.  We will certainly follow along and help out as needed.  I certainly would have a low threshold for platelet transfusion.     [name], M.D.     PRE/MEDQ  D:  03/01/2014  T:  03/01/2014  Job:  [id number]

## 2014-03-01 NOTE — Consult Note (Signed)
#  [id number] is consult note.  The thrombocytopenia is worsened due to the GNR in her blood.  She may have a component of DIC with PT and PTT being elevated.  I would not transfuse platelets as of yet.  She is not bleeding.  Some Vit K may help the coags given her cirrhosis.  We will follow along closely.  Isaac Bliss 1:7

## 2014-03-01 NOTE — Progress Notes (Signed)
Echocardiogram 2D Echocardiogram has been performed.  Krishon Adkison 03/01/2014, 11:16 AM

## 2014-03-01 NOTE — Progress Notes (Signed)
[facility] Progress Note Patient Name: [name] DOB: Feb 03, 1941 MRN: [id number]  Date of Service  03/01/2014   HPI/Events of Note   Trop noted   eICU Interventions  Assess in am for pealk   Intervention Category Minor Interventions: Routine modifications to care plan (e.g. PRN medications for pain, fever)  Raylene Miyamoto. 03/01/2014, 6:44 PM

## 2014-03-01 NOTE — Progress Notes (Signed)
Urology Inpatient Progress Report  Intv/Subj: Weaned off pressors, afebrile Troponin's rising Plts trending down Grm neg rods in blood C/o of body aches.  Past Medical History  Diagnosis Date  . Cirrhosis 08/26/2011  . Colon cancer 2001  . Bladder cancer 2003  . Memory loss     d/t chemotherapy;short and long term;takes Aricept daily  . Thrombocytopenia   . Osteoporosis     takes Vit D3 daily  . Blood transfusion   . Arthritis   . Complication of anesthesia     confusion x 2 to 3 days  . Hypertension     takes Ramipril and Metoprolol daily  . History of blood clots 50+yrs ago    legs when she was pregnancy  . Headache(784.0)     occasionally  . Joint pain   . Joint swelling   . GERD (gastroesophageal reflux disease)     takes Protonix daily  . History of GI bleed   . Constipation   . Hemorrhoids   . History of colon polyps   . Anemia, iron deficiency     iron injection about 6wks ago  . Cataract immature   . Depression     takes Celexa daily  . Attention to urostomy     urostomy d/t hx bladder cancer  . Blood dyscrasia     thromboctopenia   Current Facility-Administered Medications  Medication Dose Route Frequency Provider Last Rate Last Dose  . 0.9 %  sodium chloride infusion   Intravenous Continuous Colbert Coyer, MD 50 mL/hr at 03/01/14 0430    . antiseptic oral rinse (BIOTENE) solution 15 mL  15 mL Mouth Rinse q12n4p Chesley Mires, MD   15 mL at 02/28/14 1600  . ceFEPIme (MAXIPIME) 2 g in dextrose 5 % 50 mL IVPB  2 g Intravenous Q12H Clovis Riley, [facility]      . chlorhexidine (PERIDEX) 0.12 % solution 15 mL  15 mL Mouth Rinse BID Chesley Mires, MD   15 mL at 03/01/14 0840  . fentaNYL (SUBLIMAZE) injection 25-50 mcg  25-50 mcg Intravenous Q2H PRN Chesley Mires, MD   50 mcg at 03/01/14 0000  . norepinephrine (LEVOPHED) 4 mg in dextrose 5 % 250 mL infusion  2-50 mcg/min Intravenous Titrated Chesley Mires, MD   3 mcg/min at 02/28/14 2020  . pantoprazole  (PROTONIX) EC tablet 40 mg  40 mg Oral BID AC Chesley Mires, MD      . phytonadione (VITAMIN K) SQ injection 10 mg  10 mg Subcutaneous Daily [name], MD   10 mg at 03/01/14 1029  . traMADol (ULTRAM) tablet 50 mg  50 mg Oral Q6H PRN Raylene Miyamoto, MD   50 mg at 03/01/14 0417     Objective: Vital: Filed Vitals:   03/01/14 0800 03/01/14 0900 03/01/14 1000 03/01/14 1100  BP: 133/60 128/60 116/70 138/69  Pulse: 70 70 70 66  Temp: 97.6 F (36.4 C)     TempSrc: Oral     Resp: 20 27 24 13   Height:      Weight:      SpO2: 99% 96% 96% 98%   I/Os: I/O last 3 completed shifts: In: 2492.6 [P.O.:20; I.V.:2372.6; IV Piggyback:100] Out: 1000 [Urine:1000]  Physical Exam:  General: Patient is in no apparent distress Lungs: Normal respiratory effort, chest expands symmetrically. Ostomy pink and viable, urine straw colored Abdomen is diffusely tender Ext: lower extremities symmetric  Lab Results:  Recent Labs  02/28/14 0603 03/01/14 0615  WBC  14.3* 20.9*  HGB 13.4 12.0  HCT 39.7 35.0*    Recent Labs  02/28/14 0603 03/01/14 0615  NA 140 142  K 3.7 4.0  CL 101 105  CO2 19 20  GLUCOSE 85 81  BUN 17 33*  CREATININE 1.12* 1.12*  CALCIUM 9.4 7.9*    Recent Labs  02/28/14 1015  INR 2.32*   No results found for this basename: LABURIN,  in the last 72 hours Results for orders placed during the hospital encounter of 02/28/14  CULTURE, BLOOD (ROUTINE X 2)     Status: None   Collection Time    02/28/14  5:53 AM      Result Value Ref Range Status   Specimen Description BLOOD LEFT ARM   Final   Special Requests BOTTLES DRAWN AEROBIC ONLY 5CC   Final   Culture  Setup Time     Final   Value: 02/28/2014 08:26     Performed at [facility]   Culture     Final   Value: GRAM NEGATIVE RODS     Note: Gram Stain Report Called to,Read Back By and Verified With: MICHELLE REEVES ON 02/28/2014 AT 9:10P BY [employer]     Performed at [facility]   Report Status  PENDING   Incomplete  CULTURE, BLOOD (ROUTINE X 2)     Status: None   Collection Time    02/28/14  6:15 AM      Result Value Ref Range Status   Specimen Description BLOOD RIGHT WRIST   Final   Special Requests BOTTLES DRAWN AEROBIC ONLY 5CC   Final   Culture  Setup Time     Final   Value: 02/28/2014 08:25     Performed at [facility]   Culture     Final   Value: GRAM NEGATIVE RODS     9 Note: Gram Stain Report Called to,Read Back By and Verified With: MICHELLE REEVES RN EDMOJ [phone]     Performed at [facility]   Report Status PENDING   Incomplete  MRSA PCR SCREENING     Status: None   Collection Time    02/28/14  9:00 AM      Result Value Ref Range Status   MRSA by PCR NEGATIVE  NEGATIVE Final   Comment:            The GeneXpert MRSA Assay (FDA     approved for NASAL specimens     only), is one component of a     comprehensive MRSA colonization     surveillance program. It is not     intended to diagnose MRSA     infection nor to guide or     monitor treatment for     MRSA infections.    Studies/Results: IMPRESSION:  1. CT findings are most consistent with obstructed pyonephrosis  involving the left kidney. There is mild hydronephrosis with diffuse  mild thickening and enhancement of the urothelium concerning for  superimposed infection. Focal narrowing and soft tissue enhancement  at the UPJ which could represent a small obstructing urothelial  neoplasm, or simply apposition of the normal enhancing urothelium at  a point of stenosis. No definite obstructing stone.  2. Amorphous hypo attenuating mass like lesion within the central  aspect of the right hemi liver is nonspecific but favored to reflect  a pseudo mass related to geographic fatty infiltration of the liver.  However, given the patient's prior history of  multiple malignancies  and the background of hepatic cirrhosis, both primary hepatocellular  carcinoma and metastasis are difficult to  exclude entirely.  Recommend further evaluation with nonemergent (when the patient is  well enough to cooperate and breath hold) MRI of the abdomen with  and without contrast.  3. Hepatic cirrhosis, hepatic steatosis and portal hypertension with  evidence of esophageal varices.  4. Slight interval progression of nonocclusive portal vein thrombus  compared to April of 2013. The right and left main portal veins are  atretic.  5. Mesenteric edema and a small volume ascites likely related to  underlying portal hypertension.  6. Similar appearance of a fenestrated ventral anterior abdominal  wall with multiple omental fat containing hernias.  7. Evidence of intra-abdominal postsurgical adhesive disease without  obstruction.  8. Moderately large hiatal hernia.  9. Nonobstructing stones in the upper pole of the left kidney.  10. Surgical changes of prior right hemi colectomy and cystectomy  with patent colocolonic and enteroenteric anastomoses and right  lower quadrant diverting ileostomy   Assessment:  left pyelonephritis/pyelitis with gram negative rod urosepsis - hemodynamically stable.  She appears to be getting better clinically.  She has mild hydro which is relatively stable from 2013 with edematous kidney from her infection.  i don't think that she's obstructed - and would definitely clarify this prior to deciding on a nephrostomy tube.  Placing a ureteral stent will be very challenging through an ileal conduit and would require general anesthesia and more platelets.  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  Plan: Would continue to treat medically.   We will continue to follow.  Louis Meckel W 03/01/2014, 11:30 AM

## 2014-03-01 NOTE — Progress Notes (Addendum)
PULMONARY / CRITICAL CARE MEDICINE   Name: [name] MRN: [id number] DOB: 1941-03-11    ADMISSION DATE:  02/28/2014  REFERRING MD :  EDP  CHIEF COMPLAINT:  Fever/hypotension  BRIEF PATIENT DESCRIPTION:  73 yo former smoker presented to ED with Fever 104F, altered mental status, and hypotension with elevated lactic acid likely from urosepsis.  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  SIGNIFICANT EVENTS: 6/09 Admit, urology consulted(no nephrostomy at this time.) 6/10 + trop, hematology consulted  STUDIES:  6/09 CT abd/pelvis >> mod HH, paraesophageal varices, splenomegaly, cirrhosis, non-occlusive thrombosis main portal vein, mild Lt hydronephrosis 6/10 renal us>> 6/10 2D echo>>  LINES / TUBES: 6/9 Lt IJ CVL >>  CULTURES: 6/9 bc x 2>>gnr>> 6/9 uc>>  ANTIBIOTICS: 6/9 Vanc>> 6/10 6/9 cefepime(pcn all)>>  SUBJECTIVE:  Resting comfortably  VITAL SIGNS: Temp:  [97.7 F (36.5 C)-99.3 F (37.4 C)] 98 F (36.7 C) (06/10 0400) Pulse Rate:  [72-83] 72 (06/10 0700) Resp:  [16-36] 28 (06/10 0700) BP: (62-147)/(22-92) 113/50 mmHg (06/10 0700) SpO2:  [91 %-98 %] 91 % (06/10 0700) Weight:  [105.9 kg (233 lb 7.5 oz)] 105.9 kg (233 lb 7.5 oz) (06/10 0415) HEMODYNAMICS: CVP:  [8 mmHg-24 mmHg] 24 mmHg INTAKE / OUTPUT: Intake/Output     06/09 0701 - 06/10 0700 06/10 0701 - 06/11 0700   P.O. 20    I.V. (mL/kg) 2372.6 (22.4)    IV Piggyback 100    Total Intake(mL/kg) 2492.6 (23.5)    Urine (mL/kg/hr) 1000 (0.4)    Total Output 1000     Net +1492.6          Stool Occurrence 2 x      PHYSICAL EXAMINATION: General:  WNWD agitated wf Neuro: Follows commands. Mae x 4 , no STM HEENT: PERL. No LAN/JVD/Dry oral mucosa Cardiovascular: HSR RRR Lungs:  CTA Abdomen: Soft + bs, rt flank  urostomy bag with dirty urine Musculoskeletal:  Intact Skin:  Left arm with infiltrated IV sites  LABS:  CBC  Recent Labs Lab 02/28/14 0603 03/01/14 0615  WBC 14.3* 20.9*  HGB 13.4 12.0  HCT 39.7 35.0*  PLT 41* 16*   BMET  Recent Labs Lab 02/28/14 0603 03/01/14 0615  NA 140 142  K 3.7 4.0  CL 101 105  CO2 19 20  BUN 17 33*  CREATININE 1.12* 1.12*  GLUCOSE 85 81   Electrolytes  Recent Labs Lab 02/28/14 0603 03/01/14 0615  CALCIUM 9.4 7.9*  MG  --  1.2*  PHOS  --  4.5   Sepsis Markers  Recent Labs Lab 02/28/14 0603 02/28/14 0615 02/28/14 1041 03/01/14 0615  LATICACIDVEN 9.1* 8.75* 8.1*  --   PROCALCITON 94.81  --   --  111.41   ABG  Recent Labs Lab 02/28/14 0823  PHART 7.312*  PCO2ART 33.6*  PO2ART 77.8*   Liver Enzymes  Recent Labs Lab 02/28/14 0603 03/01/14 0615  AST 122* 99*  ALT 61* 56*  ALKPHOS 82 54  BILITOT 6.3* 4.8*  ALBUMIN 2.7* 2.4*   Imaging Ct Abdomen Pelvis W Contrast  02/28/2014   CLINICAL DATA:  Elevated LFTs and bilirubin  EXAM: CT ABDOMEN AND PELVIS WITH CONTRAST  TECHNIQUE: Multidetector CT imaging of the abdomen and pelvis was performed using the standard protocol following bolus administration of intravenous contrast.  CONTRAST:  147m  OMNIPAQUE IOHEXOL 300 MG/ML  SOLN  COMPARISON:  Prior CT abdomen/ pelvis 01/16/2012; abdominal ultrasound 01/16/2014  FINDINGS: Lower Chest: Moderately large hiatal hernia. Numerous paraesophageal varices. Cardiomegaly. No pericardial effusion. Mild dependent atelectasis in the lower lungs. Small calcified granuloma in the periphery of the right lower lobe. No suspicious pulmonary nodule or mass.  Abdomen: Unremarkable CT appearance of the stomach, duodenum, adrenal glands and pancreas save for fatty atrophy. Marked splenomegaly without significant interval change. No discrete splenic lesion. Abnormal appearance of the liver with cirrhotic morphology. The hepatic veins remain patent. Similar  appearance of chronic nonocclusive thrombus in the main portal vein with slight interval progression compared to 01/16/2012. Thrombus is present at the porta splenic confluence and within the main right portal vein. The left and right branches of the portal vein a relatively atrophic compared to prior. Diffusely heterogeneous appearance of the liver. There is an ill-defined, amorphous region of hypoattenuation centrally within hepatic segments 8/5 which measures approximately 5.2 x 5.0 x 4.2 cm. Measurements are approximate due to the amorphous nature of the abnormality. There is no discrete enhancement and internal Hounsfield units are very low suggesting severe geographic fatty infiltration. Peripherally, the hepatic parenchyma is also very heterogeneous with areas of fatty infiltration of varying severity.  Interval change in the appearance of the left kidney. There is mild hydronephrosis with diffuse thickening and enhancement of the urothelium high attenuation within the left upper pole collecting system may reflect early excretion of contrast material or interval development of nephrolithiasis. No definite stone within the UPJ yellow other is abrupt transition of the dilated and irregular renal pelvis to normal caliber ureter. The ureter at the UPJ is slightly full with enhancing soft tissue which represents enhancing soft tissue versus does collapsed and apposed enhancing thickened urothelium. Prior surgical changes of right hemicolectomy and cystectomy with right lower quadrant diverting ileostomy.  Stable renal cysts in the left kidney without significant interval change. Unremarkable appearance of the right kidney. No hydronephrosis. Small locules of air within the renal collecting system are not unexpected in the setting of ileal diversion.  Surgical changes of prior right hemicolectomy. Colocolonic anastomosis in the left lower quadrant at the sigmoid colon, enteroenteric anastomosis in the mid abdomen  and ileocolonic anastomosis in the right lower quadrant are all patent. No evidence of obstruction. Small volume of mesenteric stranding and nonloculated ascites. Multiple loops of small bowel are closely apposed to the undersurface of the anterior abdominal wall consistent with surgical adhesive disease.  Pelvis: Surgical changes of prior cystectomy and hysterectomy. Marked pelvic floor laxity. No suspicious adenopathy.  Bones/Soft Tissues: Similar appearance of fenestrated anterior abdominal wall with multiple ventral abdominal hernias containing omental fat and a small amounts of fluid. No acute fracture or aggressive appearing lytic or blastic osseous lesion.  Vascular: Atherosclerotic vascular disease without significant stenosis or aneurysmal dilatation.  IMPRESSION: 1. CT findings are most consistent with obstructed pyonephrosis involving the left kidney. There is mild hydronephrosis with diffuse mild thickening and enhancement of the urothelium concerning for superimposed infection. Focal narrowing and soft tissue enhancement at the UPJ which could represent a small obstructing urothelial neoplasm, or simply apposition of the normal enhancing urothelium at a point of stenosis. No definite obstructing stone. 2. Amorphous hypo attenuating mass like lesion within the central aspect of the right hemi liver is nonspecific but favored to reflect a pseudo mass related to geographic fatty infiltration of the liver. However, given the patient's prior history of multiple malignancies and the  background of hepatic cirrhosis, both primary hepatocellular carcinoma and metastasis are difficult to exclude entirely. Recommend further evaluation with nonemergent (when the patient is well enough to cooperate and breath hold) MRI of the abdomen with and without contrast. 3. Hepatic cirrhosis, hepatic steatosis and portal hypertension with evidence of esophageal varices. 4. Slight interval progression of nonocclusive portal  vein thrombus compared to April of 2013. The right and left main portal veins are atretic. 5. Mesenteric edema and a small volume ascites likely related to underlying portal hypertension. 6. Similar appearance of a fenestrated ventral anterior abdominal wall with multiple omental fat containing hernias. 7. Evidence of intra-abdominal postsurgical adhesive disease without obstruction. 8. Moderately large hiatal hernia. 9. Nonobstructing stones in the upper pole of the left kidney. 10. Surgical changes of prior right hemi colectomy and cystectomy with patent colocolonic and enteroenteric anastomoses and right lower quadrant diverting ileostomy. 11. Additional ancillary findings as above.   Electronically Signed   By: Jacqulynn Cadet M.D.   On: 02/28/2014 08:44   Dg Chest Port 1 View  02/28/2014   CLINICAL DATA:  Central line placement.  EXAM: PORTABLE CHEST - 1 VIEW 9:56 a.m.  COMPARISON:  02/28/2014 at 5:35 a.m.  FINDINGS: Left jugular vein catheter has been inserted and the tip is in the region of the proximal left innominate vein. No pneumothorax. Heart size and pulmonary vascularity are normal. Minimal atelectasis at the left base laterally which is new. Lungs are otherwise clear.  IMPRESSION: Central line tip is in the proximal left innominate vein. Minimal new atelectasis at the left base laterally.   Electronically Signed   By: Rozetta Nunnery M.D.   On: 02/28/2014 10:13   Dg Chest Port 1 View  (if Code Sepsis Called)  02/28/2014   CLINICAL DATA:  Altered mental status.  Fever.  Shortness of breath.  EXAM: PORTABLE CHEST - 1 VIEW  COMPARISON:  11/17/2013  FINDINGS: Cardiac enlargement with increasing pulmonary vascularity since previous study suggesting mild developing congestion. No edema or consolidation. Scarring in the right mid lung remains present. No blunting of costophrenic angles. No pneumothorax. Calcified and tortuous aorta.  IMPRESSION: Cardiac enlargement with mild vascular congestion. No edema  or consolidation.   Electronically Signed   By: Lucienne Capers M.D.   On: 02/28/2014 05:47    ASSESSMENT / PLAN:  PULMONARY A:  Atelectasis. P:   Oxygen as needed to keep SpO2 > 92% Bronchial hygiene F/u CXR as needed  CARDIOVASCULAR A:   Severe sepsis 2nd to UTI and GNR bacteremia. Hx of HTN. Elevated troponin >> likely 2nd to demand ischemia. P:  Hydration  DC antihypertensives Pressors as needed to keep MAP > 65 Monitor CVP's >> 6/10 CVP 24 >> when off pressors, try to diurese Check coox may need inotrope. F/u Echo  RENAL A:  AKI (baseline creatinine 0.8) >> 2nd to Lt hydronephrosis and UTI. Hx of bladder ca with urostomy. Low mag P:   Monitor renal fx Check renal US 6/10 Replete magnesium 6/10  GASTROINTESTINAL A:   Hx of GIB, NASH with cirrhosis and varices. P:   Protonix q12h Regular  HEMATOLOGIC A:   Chronic iron deficiency anemia, leukopenia/thrombocytopenia >> followed by Dr. [name] as outpt. Thrombocytopenia  P:  F/u CBC DC heparin 6/10 in setting of thrombocytopenia  INFECTIOUS A:   Severe sepsis 2nd to UTI + bacteremia with GNR.  Hx of PCN allergy. P:   Day 2 cefepime D/c vancomycin 6/10  ENDOCRINE A:   No  acute issues P:   F/u cortisol level(64)  NEUROLOGIC A:  Acute encephalopathy 2nd to sepsis and AKI. Hx of dementia. 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 She presents to [facility] ED 6/9 with fever 104, decreased loc, hypotension(responsive to 2 l fluid bolus) and PCCM asked to admit. She is a full code at this time. 6/10 + trop but no cards consult till more stabile.   [name] Minor ACNP Maryanna Shape PCCM Pager [phone] till 3 pm If no answer page  [phone] 03/01/2014, 9:18 AM  Reviewed above, examined, and documentation changes made as needed.  She has GNR bacteremia with sepsis mostly likely from urinary source.  Will f/u renal u/s to determine if she needs intervention for hydronephrosis.  Will d/c vancomycin.  F/u CBC.  Appreciate help from urology and hematology.  Updated family about plan.  CC time 35 minutes.  Chesley Mires, MD [facility] Pulmonary/Critical Care 03/01/2014, 10:35 AM Pager:  [phone] After 3pm call: [phone]

## 2014-03-01 NOTE — Progress Notes (Signed)
ANTIBIOTIC CONSULT NOTE - FOLLOW UP  Pharmacy Consult for vancomycin and cefepime Indication: sepsis, gram negative bacteremia  Allergies  Allergen Reactions  . Codeine Swelling and Rash    Swelling in throat  . Morphine And Related Swelling and Rash  . Other Anaphylaxis    Bee Stings  . Promethazine Hcl     Per MD chart - 08/08/11  . Penicillins Rash    Patient Measurements: Height: 5' 6.14" (168 cm) Weight: 233 lb 7.5 oz (105.9 kg) IBW/kg (Calculated) : 59.63 Adjusted Body Weight:   Vital Signs: Temp: 98 F (36.7 C) (06/10 0400) Temp src: Oral (06/10 0400) BP: 113/50 mmHg (06/10 0700) Pulse Rate: 72 (06/10 0700) Intake/Output from previous day: 06/09 0701 - 06/10 0700 In: 2492.6 [P.O.:20; I.V.:2372.6; IV Piggyback:100] Out: 1000 [Urine:1000] Intake/Output from this shift:    Labs:  Recent Labs  02/28/14 0603 03/01/14 0615  WBC 14.3* 20.9*  HGB 13.4 12.0  PLT 41* 16*  CREATININE 1.12* 1.12*   Estimated Creatinine Clearance: 54.3 ml/min (by C-G formula based on Cr of 1.12). No results found for this basename: VANCOTROUGH, Corlis Leak, VANCORANDOM, GENTTROUGH, GENTPEAK, GENTRANDOM, [place], TOBRAPEAK, TOBRARND, AMIKACINPEAK, AMIKACINTROU, AMIKACIN,  in the last 72 hours   Microbiology: Recent Results (from the past 720 hour(s))  CULTURE, BLOOD (ROUTINE X 2)     Status: None   Collection Time    02/28/14  5:53 AM      Result Value Ref Range Status   Specimen Description BLOOD LEFT ARM   Final   Special Requests BOTTLES DRAWN AEROBIC ONLY 5CC   Final   Culture  Setup Time     Final   Value: 02/28/2014 08:26     Performed at [facility]   Culture     Final   Value: GRAM NEGATIVE RODS     Note: Gram Stain Report Called to,Read Back By and Verified With: MICHELLE REEVES ON 02/28/2014 AT 9:10P BY [employer]     Performed at [facility]   Report Status PENDING   Incomplete  CULTURE, BLOOD (ROUTINE X 2)     Status: None   Collection Time   02/28/14  6:15 AM      Result Value Ref Range Status   Specimen Description BLOOD RIGHT WRIST   Final   Special Requests BOTTLES DRAWN AEROBIC ONLY 5CC   Final   Culture  Setup Time     Final   Value: 02/28/2014 08:25     Performed at [facility]   Culture     Final   Value: GRAM NEGATIVE RODS     9 Note: Gram Stain Report Called to,Read Back By and Verified With: MICHELLE REEVES RN EDMOJ [phone]     Performed at [facility]   Report Status PENDING   Incomplete  MRSA PCR SCREENING     Status: None   Collection Time    02/28/14  9:00 AM      Result Value Ref Range Status   MRSA by PCR NEGATIVE  NEGATIVE Final   Comment:            The GeneXpert MRSA Assay (FDA     approved for NASAL specimens     only), is one component of a     comprehensive MRSA colonization     surveillance program. It is not     intended to diagnose MRSA     infection nor to guide or     monitor  treatment for     MRSA infections.    Anti-infectives   Start     Dose/Rate Route Frequency Ordered Stop   03/01/14 0600  vancomycin (VANCOCIN) 1,500 mg in sodium chloride 0.9 % 500 mL IVPB     1,500 mg 250 mL/hr over 120 Minutes Intravenous Every 24 hours 02/28/14 0833     02/28/14 1800  ceFEPIme (MAXIPIME) 1 g in dextrose 5 % 50 mL IVPB     1 g 100 mL/hr over 30 Minutes Intravenous Every 12 hours 02/28/14 0729     02/28/14 0700  vancomycin (VANCOCIN) 1,500 mg in sodium chloride 0.9 % 500 mL IVPB     1,500 mg 250 mL/hr over 120 Minutes Intravenous  Once 02/28/14 0645 02/28/14 0858   02/28/14 0645  vancomycin (VANCOCIN) 15 mg/kg in sodium chloride 0.9 % 100 mL IVPB  Status:  Discontinued     15 mg/kg 100 mL/hr over 60 Minutes Intravenous  Once 02/28/14 0639 02/28/14 0644   02/28/14 0600  ceFEPIme (MAXIPIME) 2 g in dextrose 5 % 50 mL IVPB     2 g 100 mL/hr over 30 Minutes Intravenous  Once 02/28/14 0551 02/28/14 2111      Assessment: 73 year old female with colon cancer, bladder cancer,  dementia, cirrhosis nonalcoholic, ventral hernia, anemia, varices, past smoker presents to the ER by EMS for worsening altered mental status and fever. Reportedly patient's had gradually worsening confusion and altered for the past 3 days. Has R-sided urostomy. Pharmacy is asked to dose Cefepime and vancomycin for sepsis and wound infection.   6/9 >>cefepime >> 6/9 >>vancomycin >>   Tmax: 103.9 --> now afebrile WBCs: elevated Renal: 1.12, 50(N) PCT: 111 (6/10)  6/9 blood: GNR 6/9 urine:  Pending (per urology - taken from dirty urostomy bag) / sputum: ordered  Goal of Therapy:  Vancomycin trough level 15-20 mcg/ml  Plan:  Day # 2 vancomycin/Cefepime  Based on current renal function, cultures, and weight - increase cefepime to 2gm IV q12h  Vancomycin 1568m IV q24h remains appropriate at this time  Expect will be able to stop soon based on GNR in blood cx  DDoreene Eland PharmD, BCPS.   Pager: [phone] 03/01/2014,8:03 AM

## 2014-03-01 NOTE — Progress Notes (Signed)
Pt has been restless and agitated throughout the night.  At baseline, family says she has minimal short term memory.  She has complained of headaches, pain all over. She has had positive cultures growing gram negative rods in aerobic bottles, positive troponins an the most recent critical value platelets at 16.  She has also been dyspneic at rest and is generally feeling very poorly. Family remains at bedside and Elink aware of pt status.

## 2014-03-01 NOTE — Progress Notes (Addendum)
CRITICAL VALUE ALERT  Critical value received:  Platelets 16  Date of notification:  03/01/14  Time of notification:  0719  Critical value read back:  yes  Nurse who received alert:  Perry Mount, RN  MD notified (1st page):  [phone]  Time of first page:    MD notified (2nd page):  Time of second page:  Responding MD:  Gaylyn Lambert, NP, PCCM  Time MD responded:  [phone]

## 2014-03-02 ENCOUNTER — Inpatient Hospital Stay (HOSPITAL_COMMUNITY): Payer: Medicare Other

## 2014-03-02 DIAGNOSIS — R7881 Bacteremia: Secondary | ICD-10-CM

## 2014-03-02 DIAGNOSIS — F0390 Unspecified dementia without behavioral disturbance: Secondary | ICD-10-CM | POA: Diagnosis not present

## 2014-03-02 DIAGNOSIS — I959 Hypotension, unspecified: Secondary | ICD-10-CM | POA: Diagnosis not present

## 2014-03-02 DIAGNOSIS — A498 Other bacterial infections of unspecified site: Secondary | ICD-10-CM

## 2014-03-02 DIAGNOSIS — J9819 Other pulmonary collapse: Secondary | ICD-10-CM | POA: Diagnosis not present

## 2014-03-02 DIAGNOSIS — K7460 Unspecified cirrhosis of liver: Secondary | ICD-10-CM | POA: Diagnosis not present

## 2014-03-02 DIAGNOSIS — R4182 Altered mental status, unspecified: Secondary | ICD-10-CM | POA: Diagnosis not present

## 2014-03-02 LAB — PROCALCITONIN: Procalcitonin: 53.69 ng/mL

## 2014-03-02 LAB — BASIC METABOLIC PANEL
BUN: 34 mg/dL — ABNORMAL HIGH (ref 6–23)
CALCIUM: 8 mg/dL — AB (ref 8.4–10.5)
CHLORIDE: 110 meq/L (ref 96–112)
CO2: 22 meq/L (ref 19–32)
Creatinine, Ser: 0.77 mg/dL (ref 0.50–1.10)
GFR calc Af Amer: >90 mL/min (ref >90)
GFR calc non Af Amer: 81 mL/min — ABNORMAL LOW (ref >90)
Glucose, Bld: 83 mg/dL (ref 70–99)
Potassium: 3.6 mEq/L — ABNORMAL LOW (ref 3.7–5.3)
Sodium: 144 mEq/L (ref 137–147)

## 2014-03-02 LAB — CULTURE, BLOOD (ROUTINE X 2)

## 2014-03-02 LAB — URINE CULTURE
Colony Count: >=100000
Special Requests: NORMAL

## 2014-03-02 LAB — CBC
HCT: 33.4 % — ABNORMAL LOW (ref 36.0–46.0)
HEMOGLOBIN: 11.5 g/dL — AB (ref 12.0–15.0)
MCH: 31.1 pg (ref 26.0–34.0)
MCHC: 34.4 g/dL (ref 30.0–36.0)
MCV: 90.3 fL (ref 78.0–100.0)
PLATELETS: 22 10*3/uL — AB (ref 150–400)
RBC: 3.7 MIL/uL — AB (ref 3.87–5.11)
RDW: 18.2 % — ABNORMAL HIGH (ref 11.5–15.5)
WBC: 14.2 10*3/uL — AB (ref 4.0–10.5)

## 2014-03-02 LAB — TROPONIN I: TROPONIN I: 1.29 ng/mL — AB (ref <0.30)

## 2014-03-02 MED ORDER — CIPROFLOXACIN IN D5W 400 MG/200ML IV SOLN
400.0000 mg | Freq: Three times a day (TID) | INTRAVENOUS | Status: DC
  Administered 2014-03-02 – 2014-03-05 (×11): 400 mg via INTRAVENOUS
  Filled 2014-03-02 (×13): qty 200

## 2014-03-02 NOTE — Progress Notes (Signed)
PULMONARY / CRITICAL CARE MEDICINE   Name: [name] MRN: [id number] DOB: 1941-04-14    ADMISSION DATE:  02/28/2014  REFERRING MD :  EDP  CHIEF COMPLAINT:  Fever/hypotension  BRIEF PATIENT DESCRIPTION:  73 yo former smoker presented to ED with Fever 104F, altered mental status, and hypotension with elevated lactic acid from UTI, L pyelonephritis.  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  SIGNIFICANT EVENTS: 6/09 Admit, urology consulted(no nephrostomy at this time.) 6/10 + trop, hematology consulted  STUDIES:  6/09 CT abd/pelvis >> mod HH, paraesophageal varices, splenomegaly, cirrhosis, non-occlusive thrombosis main portal vein, mild Lt hydronephrosis 6/10 renal us>>no hydro 6/10 2D echo>>ef 55-60%, mod dilated LA and RA, moderate LVH, PA pressure 52 mmHg  LINES / TUBES: 6/9 Lt IJ CVL >>  CULTURES: 6/9 bc x 2>>gnr>>EColi (sens cipro) 6/9 uc>>Ecoli (sens cipro)  ANTIBIOTICS: 6/9 Vanc>> 6/10 6/9 cefepime(pcn all)>>6/11 6/11 Cipro>>> plan at least 14 days total abx  SUBJECTIVE:  Resting comfortably  VITAL SIGNS: Temp:  [97.8 F (36.6 C)-99.3 F (37.4 C)] 97.8 F (36.6 C) (06/11 0800) Pulse Rate:  [58-76] 61 (06/11 0900) Resp:  [13-24] 18 (06/11 0900) BP: (91-190)/(46-149) 157/79 mmHg (06/11 0900) SpO2:  [87 %-98 %] 98 % (06/11 0900) Weight:  [238 lb 1.6 oz (108 kg)] 238 lb 1.6 oz (108 kg) (06/11 0500) HEMODYNAMICS:   INTAKE / OUTPUT: Intake/Output     06/10 0701 - 06/11 0700 06/11 0701 - 06/12 0700   P.O.     I.V. (mL/kg) 1100 (10.2) 200 (1.9)   IV Piggyback 100    Total Intake(mL/kg) 1200 (11.1) 200 (1.9)   Urine (mL/kg/hr) 1350 (0.5) 250 (0.8)   Total Output 1350 250   Net -150 -50        Stool Occurrence       PHYSICAL EXAMINATION: General:  WNWD agitated  wf Neuro: Follows commands. Mae x 4 , no STM HEENT: PERL. No LAN/JVD/Dry oral mucosa Cardiovascular: HSR RRR Lungs:  CTA Abdomen: Soft + bs, rt flank urostomy bag with dirty urine Musculoskeletal:  Intact Skin:  Left arm with infiltrated IV sites  LABS:  CBC  Recent Labs Lab 02/28/14 0603 03/01/14 0615 03/02/14 0450  WBC 14.3* 20.9* 14.2*  HGB 13.4 12.0 11.5*  HCT 39.7 35.0* 33.4*  PLT 41* 16* 22*   BMET  Recent Labs Lab 02/28/14 0603 03/01/14 0615 03/02/14 0450  NA 140 142 144  K 3.7 4.0 3.6*  CL 101 105 110  CO2 19 20 22   BUN 17 33* 34*  CREATININE 1.12* 1.12* 0.77  GLUCOSE 85 81 83   Electrolytes  Recent Labs Lab 02/28/14 0603 03/01/14 0615 03/02/14 0450  CALCIUM 9.4 7.9* 8.0*  MG  --  1.2*  --   PHOS  --  4.5  --    Sepsis Markers  Recent Labs Lab 02/28/14 0603 02/28/14 0615 02/28/14 1041 03/01/14 0615 03/01/14 1045 03/02/14 0450  LATICACIDVEN 9.1* 8.75* 8.1*  --  3.4*  --   PROCALCITON 94.81  --   --  111.41  --  53.69   ABG  Recent Labs Lab 02/28/14 0823  PHART 7.312*  PCO2ART 33.6*  PO2ART 77.8*   Liver Enzymes  Recent Labs Lab 02/28/14 0603 03/01/14 0615  AST 122* 99*  ALT 61* 56*  ALKPHOS 82 54  BILITOT 6.3*  4.8*  ALBUMIN 2.7* 2.4*   Imaging US Renal  03/01/2014   CLINICAL DATA:  Acute renal failure.  EXAM: RENAL/URINARY TRACT ULTRASOUND COMPLETE  COMPARISON:  02/28/2014 and 01/16/2014  FINDINGS: Right Kidney:  Length: 12.3 cm in length. Echogenicity within normal limits. No mass or hydronephrosis visualized.  Left Kidney:  Length: 13.2 cm in length. 1.4 cm calculus in the upper pole. 1.5 x 1.2 x 1.2 cm hyperechoic mass lesion in the upper pole. Benign appearing cyst in the lower pole measuring 6.5 x 5.3 x 6.3 cm. No hydronephrosis.  Bladder:  Appears normal for degree of bladder distention.  Additional finding: The spleen is enlarged. There is a 2.0 cm hypoechoic mass in the right lobe of the liver.  IMPRESSION: No  hydronephrosis.  Echogenic mass in the upper pole of the left kidney. MRI with contrast is recommended when feasible.  Left nephrolithiasis.  Liver mass.  MRI with contrast is recommended when feasible.  Splenomegaly.   Electronically Signed   By: Maryclare Bean M.D.   On: 03/01/2014 12:09   Dg Chest Port 1 View  03/02/2014   CLINICAL DATA:  Pulmonary edema  EXAM: PORTABLE CHEST - 1 VIEW  COMPARISON:  03/01/2014  FINDINGS: Central venous catheter tip in the SVC is unchanged. Negative for heart failure. Mild left lower lobe atelectasis. Negative for pneumonia or effusion.  IMPRESSION: Mild left lower lobe atelectasis.   Electronically Signed   By: Franchot Gallo M.D.   On: 03/02/2014 07:15   Dg Chest Port 1 View  03/01/2014   CLINICAL DATA:  CHF.  EXAM: PORTABLE CHEST - 1 VIEW  COMPARISON:  02/28/2014  FINDINGS: Cardiac silhouette is mildly enlarged. The aorta is mildly uncoiled. No mediastinal or hilar masses.  Mild basilar lung opacity, likely atelectasis. No overt pulmonary edema. No lung consolidation to suggest pneumonia. No definite pleural effusion and no pneumothorax.  Left internal jugular central venous line tip is stable with its tip in the central innominate vein.  IMPRESSION: Mild lung base atelectasis, but no convincing acute finding. No evidence of pulmonary edema. Stable appearance from the previous day's study.   Electronically Signed   By: Lajean Manes M.D.   On: 03/01/2014 12:19   Dg Chest Port 1 View  02/28/2014   CLINICAL DATA:  Central line placement.  EXAM: PORTABLE CHEST - 1 VIEW 9:56 a.m.  COMPARISON:  02/28/2014 at 5:35 a.m.  FINDINGS: Left jugular vein catheter has been inserted and the tip is in the region of the proximal left innominate vein. No pneumothorax. Heart size and pulmonary vascularity are normal. Minimal atelectasis at the left base laterally which is new. Lungs are otherwise clear.  IMPRESSION: Central line tip is in the proximal left innominate vein. Minimal new  atelectasis at the left base laterally.   Electronically Signed   By: Rozetta Nunnery M.D.   On: 02/28/2014 10:13    ASSESSMENT / PLAN:  PULMONARY A:  Atelectasis. P:   Oxygen as needed to keep SpO2 > 92% Bronchial hygiene F/u CXR as needed Mobilize   CARDIOVASCULAR A:   Severe sepsis 2nd to UTI and GNR bacteremia. Hx of HTN. Elevated troponin >> likely 2nd to demand ischemia>>peak 3.34, trending back down, echo ok P:  Continue gentle hydration  Continue hold home anti-htn  D/c cvl  Consider further cards w/u  RENAL A:  AKI (baseline creatinine 0.8) >> 2nd to Lt hydronephrosis and UTI. Hx of bladder ca with urostomy. Low mag P:  F/u chem  Replete electrolytes as needed    GASTROINTESTINAL A:   Hx of GIB, NASH with cirrhosis and varices. P:   Protonix q12h Regular diet as tol   HEMATOLOGIC A:   Chronic iron deficiency anemia, leukopenia/thrombocytopenia >> followed by Dr. [name] as outpt. Thrombocytopenia  P:  F/u CBC SCD's   INFECTIOUS A:   Severe sepsis 2nd to UTI/ L pyelonephritis  EColi bacteremia  P:   Change to cipro - ecoli urine and blood, both sens cipro plan at least 14 days total abx  ENDOCRINE A:   No acute issues P:     NEUROLOGIC A:  Acute encephalopathy 2nd to sepsis and AKI- improving slowly. Hx of dementia. P:   Safety issue due to dementia   EColi bacteremia, EColi UTI.  No hydronephrosis.  Urology following peripherally. Consider cards w/u once more stable with elevated troponins, likely demand ischemia. Change abx to Cipro and plan 2 weeks total. Will tx floor and ask [facility] to assume care 6/12.    Nickolas Madrid, NP 03/02/2014  9:59 AM Pager: [phone] or [phone]  Reviewed above, examined, and agree.  Much improved.  She has E coli in blood and urine >> change Abx to cipro.  Will d/c CVL, and transfer to [place].  Will ask [facility] to assume care 6/12 and PCCM sign off.  Updated husband about plan.  Chesley Mires, MD [facility] Pulmonary/Critical Care 03/02/2014, 11:08 AM Pager:  [phone] After 3pm call: [phone]

## 2014-03-02 NOTE — Progress Notes (Signed)
Urology Inpatient Progress Report  Intv/Subj: Ecoli speciated in blood Afebrile Slept well Body aches improved  Past Medical History  Diagnosis Date  . Cirrhosis 08/26/2011  . Colon cancer 2001  . Bladder cancer 2003  . Memory loss     d/t chemotherapy;short and long term;takes Aricept daily  . Thrombocytopenia   . Osteoporosis     takes Vit D3 daily  . Blood transfusion   . Arthritis   . Complication of anesthesia     confusion x 2 to 3 days  . Hypertension     takes Ramipril and Metoprolol daily  . History of blood clots 50+yrs ago    legs when she was pregnancy  . Headache(784.0)     occasionally  . Joint pain   . Joint swelling   . GERD (gastroesophageal reflux disease)     takes Protonix daily  . History of GI bleed   . Constipation   . Hemorrhoids   . History of colon polyps   . Anemia, iron deficiency     iron injection about 6wks ago  . Cataract immature   . Depression     takes Celexa daily  . Attention to urostomy     urostomy d/t hx bladder cancer  . Blood dyscrasia     thromboctopenia   Current Facility-Administered Medications  Medication Dose Route Frequency Provider Last Rate Last Dose  . 0.9 %  sodium chloride infusion   Intravenous Continuous Colbert Coyer, MD 50 mL/hr at 03/01/14 1610    . antiseptic oral rinse (BIOTENE) solution 15 mL  15 mL Mouth Rinse q12n4p Chesley Mires, MD   15 mL at 03/01/14 1716  . ceFEPIme (MAXIPIME) 2 g in dextrose 5 % 50 mL IVPB  2 g Intravenous Q12H Clovis Riley, RPH   2 g at 03/02/14 0606  . chlorhexidine (PERIDEX) 0.12 % solution 15 mL  15 mL Mouth Rinse BID Chesley Mires, MD   15 mL at 03/01/14 2005  . fentaNYL (SUBLIMAZE) injection 25-50 mcg  25-50 mcg Intravenous Q2H PRN Chesley Mires, MD   50 mcg at 03/01/14 2308  . norepinephrine (LEVOPHED) 4 mg in dextrose 5 % 250 mL infusion  2-50 mcg/min Intravenous Titrated Chesley Mires, MD   3 mcg/min at 02/28/14 2020  . pantoprazole (PROTONIX) EC tablet 40 mg   40 mg Oral BID AC Chesley Mires, MD   40 mg at 03/01/14 1716  . phytonadione (VITAMIN K) SQ injection 10 mg  10 mg Subcutaneous Daily [name], MD   10 mg at 03/01/14 1029  . traMADol (ULTRAM) tablet 50 mg  50 mg Oral Q6H PRN Raylene Miyamoto, MD   50 mg at 03/01/14 1716     Objective: Vital: Filed Vitals:   03/02/14 0200 03/02/14 0300 03/02/14 0400 03/02/14 0500  BP: 112/86 128/63 133/56 136/46  Pulse: 58 62 62 62  Temp:      TempSrc:      Resp: 17 18 19 16   Height:      Weight:    108 kg (238 lb 1.6 oz)  SpO2: 96% 93% 92% 95%   I/Os: I/O last 3 completed shifts: In: 2333.8 [P.O.:20; I.V.:2213.8; IV Piggyback:100] Out: 1610 [Urine:1750]  Physical Exam:  General: Patient is in no apparent distress Lungs: Normal respiratory effort, chest expands symmetrically. Abdomen is diffusely tender - improved Ext: lower extremities symmetric  Lab Results:  Recent Labs  02/28/14 0603 03/01/14 0615 03/02/14 0450  WBC 14.3* 20.9* 14.2*  HGB 13.4 12.0 11.5*  HCT 39.7 35.0* 33.4*    Recent Labs  02/28/14 0603 03/01/14 0615 03/02/14 0450  NA 140 142 144  K 3.7 4.0 3.6*  CL 101 105 110  CO2 19 20 22   GLUCOSE 85 81 83  BUN 17 33* 34*  CREATININE 1.12* 1.12* 0.77  CALCIUM 9.4 7.9* 8.0*    Recent Labs  02/28/14 1015  INR 2.32*   No results found for this basename: LABURIN,  in the last 72 hours Results for orders placed during the hospital encounter of 02/28/14  URINE CULTURE     Status: None   Collection Time    02/28/14  5:35 AM      Result Value Ref Range Status   Specimen Description URINE, RANDOM   Final   Special Requests Normal   Final   Culture  Setup Time     Final   Value: 02/28/2014 11:52     Performed at [facility]     Final   Value: >=100,000 COLONIES/ML     Performed at [facility]   Culture     Final   Value: ESCHERICHIA COLI     Performed at [facility]   Report Status 03/02/2014 FINAL   Final    Organism ID, Bacteria ESCHERICHIA COLI   Final  CULTURE, BLOOD (ROUTINE X 2)     Status: None   Collection Time    02/28/14  5:53 AM      Result Value Ref Range Status   Specimen Description BLOOD LEFT ARM   Final   Special Requests BOTTLES DRAWN AEROBIC ONLY 5CC   Final   Culture  Setup Time     Final   Value: 02/28/2014 08:26     Performed at [facility]   Culture     Final   Value: ESCHERICHIA COLI     Note: SUSCEPTIBILITIES PERFORMED ON PREVIOUS CULTURE WITHIN THE LAST 5 DAYS.     Note: Gram Stain Report Called to,Read Back By and Verified With: MICHELLE REEVES ON 02/28/2014 AT 9:10P BY WILEJ     Performed at [facility]   Report Status 03/02/2014 FINAL   Final  CULTURE, BLOOD (ROUTINE X 2)     Status: None   Collection Time    02/28/14  6:15 AM      Result Value Ref Range Status   Specimen Description BLOOD RIGHT WRIST   Final   Special Requests BOTTLES DRAWN AEROBIC ONLY 5CC   Final   Culture  Setup Time     Final   Value: 02/28/2014 08:25     Performed at [facility]   Culture     Final   Value: ESCHERICHIA COLI     9 Note: Gram Stain Report Called to,Read Back By and Verified With: MICHELLE REEVES RN EDMOJ [phone]     Performed at [facility]   Report Status 03/02/2014 FINAL   Final   Organism ID, Bacteria ESCHERICHIA COLI   Final  MRSA PCR SCREENING     Status: None   Collection Time    02/28/14  9:00 AM      Result Value Ref Range Status   MRSA by PCR NEGATIVE  NEGATIVE Final   Comment:            The GeneXpert MRSA Assay (FDA     approved for NASAL specimens     only), is  one component of a     comprehensive MRSA colonization     surveillance program. It is not     intended to diagnose MRSA     infection nor to guide or     monitor treatment for     MRSA infections.    Studies/Results: IMPRESSION:  1. CT findings are most consistent with obstructed pyonephrosis  involving the left kidney. There is mild hydronephrosis  with diffuse  mild thickening and enhancement of the urothelium concerning for  superimposed infection. Focal narrowing and soft tissue enhancement  at the UPJ which could represent a small obstructing urothelial  neoplasm, or simply apposition of the normal enhancing urothelium at  a point of stenosis. No definite obstructing stone.  2. Amorphous hypo attenuating mass like lesion within the central  aspect of the right hemi liver is nonspecific but favored to reflect  a pseudo mass related to geographic fatty infiltration of the liver.  However, given the patient's prior history of multiple malignancies  and the background of hepatic cirrhosis, both primary hepatocellular  carcinoma and metastasis are difficult to exclude entirely.  Recommend further evaluation with nonemergent (when the patient is  well enough to cooperate and breath hold) MRI of the abdomen with  and without contrast.  3. Hepatic cirrhosis, hepatic steatosis and portal hypertension with  evidence of esophageal varices.  4. Slight interval progression of nonocclusive portal vein thrombus  compared to April of 2013. The right and left main portal veins are  atretic.  5. Mesenteric edema and a small volume ascites likely related to  underlying portal hypertension.  6. Similar appearance of a fenestrated ventral anterior abdominal  wall with multiple omental fat containing hernias.  7. Evidence of intra-abdominal postsurgical adhesive disease without  obstruction.  8. Moderately large hiatal hernia.  9. Nonobstructing stones in the upper pole of the left kidney.  10. Surgical changes of prior right hemi colectomy and cystectomy  with patent colocolonic and enteroenteric anastomoses and right  lower quadrant diverting ileostomy  Renal ultrasound- no evidence of hydronephrosis  Assessment:  left pyelonephritis/pyelitis with gram negative rod urosepsis - hemodynamically stable.  She appears to be getting better  clinically.  No longer has dilation of left collecting system.  E. Coli sensitive to cipro   Plan: Continue to treat medically.  Would recommend at least two weeks of abx.  Will continue to follow, but may not see every day.  Please call with additional questions or concerns.  She has scheduled follow-up with Dr. Jeffie Pollock already, will just keep that appt.  Would continue to treat medically.   We will continue to follow.  Louis Meckel W 03/02/2014, 8:05 AM

## 2014-03-03 DIAGNOSIS — R109 Unspecified abdominal pain: Secondary | ICD-10-CM

## 2014-03-03 DIAGNOSIS — R4182 Altered mental status, unspecified: Secondary | ICD-10-CM | POA: Diagnosis not present

## 2014-03-03 DIAGNOSIS — D62 Acute posthemorrhagic anemia: Secondary | ICD-10-CM | POA: Diagnosis not present

## 2014-03-03 DIAGNOSIS — D649 Anemia, unspecified: Secondary | ICD-10-CM | POA: Diagnosis not present

## 2014-03-03 DIAGNOSIS — R7881 Bacteremia: Secondary | ICD-10-CM | POA: Diagnosis not present

## 2014-03-03 DIAGNOSIS — A498 Other bacterial infections of unspecified site: Secondary | ICD-10-CM | POA: Diagnosis not present

## 2014-03-03 LAB — BASIC METABOLIC PANEL
BUN: 20 mg/dL (ref 6–23)
CHLORIDE: 110 meq/L (ref 96–112)
CO2: 22 meq/L (ref 19–32)
CREATININE: 0.54 mg/dL (ref 0.50–1.10)
Calcium: 8.6 mg/dL (ref 8.4–10.5)
GFR calc Af Amer: >90 mL/min (ref >90)
GFR calc non Af Amer: >90 mL/min (ref >90)
GLUCOSE: 115 mg/dL — AB (ref 70–99)
Potassium: 3.2 mEq/L — ABNORMAL LOW (ref 3.7–5.3)
Sodium: 143 mEq/L (ref 137–147)

## 2014-03-03 LAB — CBC
HEMATOCRIT: 33.3 % — AB (ref 36.0–46.0)
Hemoglobin: 11.4 g/dL — ABNORMAL LOW (ref 12.0–15.0)
MCH: 30.2 pg (ref 26.0–34.0)
MCHC: 34.2 g/dL (ref 30.0–36.0)
MCV: 88.3 fL (ref 78.0–100.0)
Platelets: 20 10*3/uL — CL (ref 150–400)
RBC: 3.77 MIL/uL — ABNORMAL LOW (ref 3.87–5.11)
RDW: 17.7 % — ABNORMAL HIGH (ref 11.5–15.5)
WBC: 8.1 10*3/uL (ref 4.0–10.5)

## 2014-03-03 MED ORDER — RAMIPRIL 5 MG PO CAPS
5.0000 mg | ORAL_CAPSULE | Freq: Every day | ORAL | Status: DC
  Administered 2014-03-03 – 2014-03-07 (×5): 5 mg via ORAL
  Filled 2014-03-03 (×5): qty 1

## 2014-03-03 NOTE — Progress Notes (Signed)
[name] is looking much better. She now is not out of the ICU. She has Escherichia coli in the blood and urine. He's pretty much sensitive to antibiotics.  There's been no bleeding. Her platelet count is 20,000. Hemoglobin is holding steady at 11.4.  I think the platelet count will slowly come back up. Again, with gram-negative bacteremia, there is an element of DIC and actual platelet dysfunction. Given her splenomegaly and cirrhosis, this process is much more pronounced.  I still would not transfuse her.  Her vital signs are all stable. There's really no change in her physical exam.  We will continue to follow her along. If any problems arise over the weekend, just let us know if we can help.  Pete E.  T[place] 2:11

## 2014-03-03 NOTE — Progress Notes (Addendum)
Patient ID: [name], female   DOB: [birth year], 73 y.o.   MRN: [id number]  [facility] PROGRESS NOTE  NIYLAH HASSAN UGQ:[id number] DOB: [birth year] DOA: 02/28/2014 PCP: Florina Ou, MD  Brief narrative: 73 yo former smoker presented to ED with Fever 104F, altered mental status, and hypotension with elevated lactic acid from UTI, L pyelonephritis. 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   Assessment and plan: Atelectasis - Oxygen as needed to keep SpO2 > 92%  - Bronchial hygiene  - F/u CXR as needed  - Mobilize  Severe sepsis 2nd to UTI and GNR bacteremia - Hx of HTN.  - Elevated troponin >> likely 2nd to demand ischemia>>peak 3.34, trending back down, echo ok  - resume Altace per home medical regimen  - changed ABX to cipro, total 14 days of treatment   AKI (baseline creatinine 0.8) >> 2nd to Lt hydronephrosis and UTI.  - Hx of bladder ca with urostomy.  - supplement electrolytes as indicated, potassium is still low  - Cr trending down and WNL this AM  - repeat BMP in AM Hx of GIB, NASH with cirrhosis and varices.  - Protonix q12h  - Regular diet as tol  Chronic iron deficiency anemia,  -  leukopenia/thrombocytopenia >> followed by Dr. [name] as outpt.  - Hg remains stable overall  Thrombocytopenia  - SCD's, no signs of active bleeding  - CBC in AM Acute encephalopathy 2nd to sepsis and AKI- improving slowly.  - Safety issue due to dementia    SIGNIFICANT EVENTS:  6/09 Admit, urology consulted(no nephrostomy at this time.)  6/10 + trop, hematology consulted  6/12 [facility]  STUDIES:  6/09 CT abd/pelvis >> mod HH, paraesophageal varices, splenomegaly, cirrhosis, non-occlusive thrombosis main portal vein, mild Lt hydronephrosis  6/10 renal us>>no  hydro  6/10 2D echo>>ef 55-60%, mod dilated LA and RA, moderate LVH, PA pressure 52 mmHg  6/11 CXR Mild left lower lobe atelectasis.    LINES / TUBES:  6/9 Lt IJ CVL >>  CULTURES:  6/9 bc x 2>>gnr>>EColi (sens cipro)  6/9 uc>>Ecoli (sens cipro)  ANTIBIOTICS:  6/9 Vanc>> 6/10  6/9 cefepime(pcn all)>>6/11  6/11 Cipro>>> plan at least 14 days total abx   Consultants:  Urology   Code Status: Full Family Communication: Pt at bedside Disposition Plan: Remains inpatient   HPI/Subjective: No events overnight.   Objective: Filed Vitals:   03/02/14 1449 03/02/14 2125 03/03/14 0609 03/03/14 1314  BP: 145/73 165/83 147/65 153/90  Pulse: 61 66 66 71  Temp: 98.6 F (37 C) 98.3 F (36.8 C) 97.9 F (36.6 C) 98.4 F (36.9 C)  TempSrc: Oral Oral Oral Oral  Resp: 16 20 18 20   Height:      Weight:   112.2 kg (247 lb 5.7 oz)   SpO2: 100% 96% 94% 96%    Intake/Output Summary (Last 24 hours) at 03/03/14 1430 Last data filed at 03/03/14 0954  Gross per 24 hour  Intake 1709.17 ml  Output   2300 ml  Net -590.83 ml    Exam:   General:  Pt is alert, follows commands appropriately, not in acute distress  Cardiovascular: Regular rate and rhythm, no rubs, no gallops  Respiratory: Clear to auscultation bilaterally, no wheezing, diminished breath sounds at bases   Abdomen: Soft, non tender, non distended,  bowel sounds present, no guarding  Data Reviewed: Basic Metabolic Panel:  Recent Labs Lab 02/28/14 0603 03/01/14 0615 03/02/14 0450 03/03/14 0315  NA 140 142 144 143  K 3.7 4.0 3.6* 3.2*  CL 101 105 110 110  CO2 19 20 22 22   GLUCOSE 85 81 83 115*  BUN 17 33* 34* 20  CREATININE 1.12* 1.12* 0.77 0.54  CALCIUM 9.4 7.9* 8.0* 8.6  MG  --  1.2*  --   --   PHOS  --  4.5  --   --    Liver Function Tests:  Recent Labs Lab 02/28/14 0603 03/01/14 0615  AST 122* 99*  ALT 61* 56*  ALKPHOS 82 54  BILITOT 6.3* 4.8*  PROT 5.5* 5.0*  ALBUMIN 2.7* 2.4*   CBC:  Recent  Labs Lab 02/28/14 0603 03/01/14 0615 03/02/14 0450 03/03/14 0315  WBC 14.3* 20.9* 14.2* 8.1  HGB 13.4 12.0 11.5* 11.4*  HCT 39.7 35.0* 33.4* 33.3*  MCV 90.8 89.7 90.3 88.3  PLT 41* 16* 22* 20*   Cardiac Enzymes:  Recent Labs Lab 02/28/14 1015 02/28/14 2023 03/01/14 0615 03/01/14 1045 03/02/14 0450  TROPONINI 0.68* 1.36* 2.65* 3.34* 1.29*   BNP: No components found with this basename: POCBNP,  CBG: No results found for this basename: GLUCAP,  in the last 168 hours  Recent Results (from the past 240 hour(s))  URINE CULTURE     Status: None   Collection Time    02/28/14  5:35 AM      Result Value Ref Range Status   Specimen Description URINE, RANDOM   Final   Special Requests Normal   Final   Culture  Setup Time     Final   Value: 02/28/2014 11:52     Performed at [facility]     Final   Value: >=100,000 COLONIES/ML     Performed at [facility]   Culture     Final   Value: ESCHERICHIA COLI     Performed at [facility]   Report Status 03/02/2014 FINAL   Final   Organism ID, Bacteria ESCHERICHIA COLI   Final  CULTURE, BLOOD (ROUTINE X 2)     Status: None   Collection Time    02/28/14  5:53 AM      Result Value Ref Range Status   Specimen Description BLOOD LEFT ARM   Final   Special Requests BOTTLES DRAWN AEROBIC ONLY 5CC   Final   Culture  Setup Time     Final   Value: 02/28/2014 08:26     Performed at [facility]   Culture     Final   Value: ESCHERICHIA COLI     Note: SUSCEPTIBILITIES PERFORMED ON PREVIOUS CULTURE WITHIN THE LAST 5 DAYS.     Note: Gram Stain Report Called to,Read Back By and Verified With: MICHELLE REEVES ON 02/28/2014 AT 9:10P BY WILEJ     Performed at [facility]   Report Status 03/02/2014 FINAL   Final  CULTURE, BLOOD (ROUTINE X 2)     Status: None   Collection Time    02/28/14  6:15 AM      Result Value Ref Range Status   Specimen Description BLOOD RIGHT WRIST   Final   Special  Requests BOTTLES DRAWN AEROBIC ONLY 5CC   Final   Culture  Setup Time     Final   Value: 02/28/2014 08:25     Performed at [facility]   Culture     Final   Value: ESCHERICHIA COLI     9 Note: Gram Stain Report Called to,Read Back By and Verified With: MICHELLE REEVES RN EDMOJ [phone]     Performed at [facility]   Report Status 03/02/2014 FINAL   Final   Organism ID, Bacteria ESCHERICHIA COLI   Final  MRSA PCR SCREENING     Status: None   Collection Time    02/28/14  9:00 AM      Result Value Ref Range Status   MRSA by PCR NEGATIVE  NEGATIVE Final   Comment:            The GeneXpert MRSA Assay (FDA     approved for NASAL specimens     only), is one component of a     comprehensive MRSA colonization     surveillance program. It is not     intended to diagnose MRSA     infection nor to guide or     monitor treatment for     MRSA infections.     Scheduled Meds: . antiseptic oral rinse  15 mL Mouth Rinse q12n4p  . chlorhexidine  15 mL Mouth Rinse BID  . ciprofloxacin  400 mg Intravenous 3 times per day  . pantoprazole  40 mg Oral BID AC   Continuous Infusions: . sodium chloride 50 mL/hr at 03/03/14 1448   Faye Ramsay, MD  [facility] Pager [phone]  If 7PM-7AM, please contact night-coverage [email or web address] Password [email or web address] 03/03/2014, 2:30 PM   LOS: 3 days

## 2014-03-04 DIAGNOSIS — R109 Unspecified abdominal pain: Secondary | ICD-10-CM | POA: Diagnosis not present

## 2014-03-04 DIAGNOSIS — K922 Gastrointestinal hemorrhage, unspecified: Secondary | ICD-10-CM | POA: Diagnosis not present

## 2014-03-04 DIAGNOSIS — R4182 Altered mental status, unspecified: Secondary | ICD-10-CM | POA: Diagnosis not present

## 2014-03-04 DIAGNOSIS — D62 Acute posthemorrhagic anemia: Secondary | ICD-10-CM | POA: Diagnosis not present

## 2014-03-04 LAB — CBC
HEMATOCRIT: 32.6 % — AB (ref 36.0–46.0)
Hemoglobin: 11.3 g/dL — ABNORMAL LOW (ref 12.0–15.0)
MCH: 30.5 pg (ref 26.0–34.0)
MCHC: 34.7 g/dL (ref 30.0–36.0)
MCV: 88.1 fL (ref 78.0–100.0)
Platelets: 22 10*3/uL — CL (ref 150–400)
RBC: 3.7 MIL/uL — ABNORMAL LOW (ref 3.87–5.11)
RDW: 17.3 % — AB (ref 11.5–15.5)
WBC: 4.7 10*3/uL (ref 4.0–10.5)

## 2014-03-04 LAB — BASIC METABOLIC PANEL
BUN: 11 mg/dL (ref 6–23)
CALCIUM: 8.9 mg/dL (ref 8.4–10.5)
CO2: 26 mEq/L (ref 19–32)
Chloride: 106 mEq/L (ref 96–112)
Creatinine, Ser: 0.49 mg/dL — ABNORMAL LOW (ref 0.50–1.10)
GFR calc Af Amer: >90 mL/min (ref >90)
GLUCOSE: 143 mg/dL — AB (ref 70–99)
Potassium: 2.7 mEq/L — CL (ref 3.7–5.3)
Sodium: 140 mEq/L (ref 137–147)

## 2014-03-04 LAB — MAGNESIUM: Magnesium: 1.4 mg/dL — ABNORMAL LOW (ref 1.5–2.5)

## 2014-03-04 MED ORDER — MAGNESIUM SULFATE 40 MG/ML IJ SOLN
2.0000 g | Freq: Once | INTRAMUSCULAR | Status: AC
  Administered 2014-03-04: 2 g via INTRAVENOUS
  Filled 2014-03-04: qty 50

## 2014-03-04 MED ORDER — POTASSIUM CHLORIDE CRYS ER 20 MEQ PO TBCR
40.0000 meq | EXTENDED_RELEASE_TABLET | Freq: Once | ORAL | Status: AC
  Administered 2014-03-04: 40 meq via ORAL
  Filled 2014-03-04: qty 2

## 2014-03-04 MED ORDER — POTASSIUM CHLORIDE 10 MEQ/100ML IV SOLN
10.0000 meq | INTRAVENOUS | Status: AC
  Administered 2014-03-04 (×4): 10 meq via INTRAVENOUS
  Filled 2014-03-04 (×4): qty 100

## 2014-03-04 NOTE — Evaluation (Signed)
Occupational Therapy Evaluation and Discharge Patient Details Name: KASSY MCENROE MRN: [id number] DOB: 1941-05-14 Today's Date: 03/04/2014    History of Present Illness Patient is a 73 y/o female with h/o bladder CA with urostomoy and colon CA  and dementia.  She was admitted with pyelonephritis.   Clinical Impression   This 73 yo female admitted with above presents to acute OT at an increased level of A than pta; however does not require any lifting A to speak of only increased A for LB ADLs--which husband and aide can A with prn until she can do them more for herself. In her own familiar environment she might even be able to do more herself now. Acute OT will sign off.    Follow Up Recommendations  No OT follow up    Equipment Recommendations  None recommended by OT       Precautions / Restrictions Precautions Precautions: Fall Restrictions Weight Bearing Restrictions: No      Mobility Bed Mobility Overal bed mobility: Needs Assistance Bed Mobility: Supine to Sit     Supine to sit: Min assist;HOB elevated     General bed mobility comments: Pt up in recliner upon arrival  Transfers Overall transfer level: Needs assistance Equipment used: Rolling walker (2 wheeled) Transfers: Sit to/from Stand Sit to Stand: Min assist         General transfer comment: assist from low surface of bed, supervision from higher recliner; cues for safety with hand placement    Balance Overall balance assessment: Needs assistance Sitting-balance support: Feet supported;No upper extremity supported Sitting balance-Leahy Scale: Fair     Standing balance support: Bilateral upper extremity supported Standing balance-Leahy Scale: Poor Standing balance comment: needs UE support for balance and supervision for safety                            ADL Overall ADL's : Needs assistance/impaired Eating/Feeding: Sitting;Supervision/ safety   Grooming: Set  up;Sitting;Supervision/safety   Upper Body Bathing: Set up;Supervision/ safety;Sitting   Lower Body Bathing: Moderate assistance;Sit to/from stand   Upper Body Dressing : Set up;Supervision/safety;Sitting   Lower Body Dressing: Set up;Supervision/safety;Sit to/from stand   Toilet Transfer: Minimal assistance;RW;Ambulation;BSC (over [employer])   Toileting- Water quality scientist and Hygiene: Moderate assistance;Sit to/from stand       Functional mobility during ADLs: Minimal assistance                 Pertinent Vitals/Pain FACES 4/10 (left ankle-- husband states this is chronic)     Hand Dominance Left   Extremity/Trunk Assessment Upper Extremity Assessment Upper Extremity Assessment: Overall WFL for tasks assessed   Lower Extremity Assessment Lower Extremity Assessment: Generalized weakness       Communication Communication Communication: No difficulties   Cognition Arousal/Alertness: Awake/alert Behavior During Therapy: WFL for tasks assessed/performed Overall Cognitive Status: History of cognitive impairments - at baseline                                Home Living Family/patient expects to be discharged to:: Private residence Living Arrangements: Spouse/significant other Available Help at Discharge: Family;Personal care attendant;Available 24 hours/day Type of Home: House Home Access: Stairs to enter [employer] of Steps: 3 Entrance Stairs-Rails: Right;Left Home Layout: Two level;Able to live on main level with bedroom/bathroom Alternate Level Stairs-Number of Steps: stays on main   Bathroom Shower/Tub: Occupational psychologist:  Standard     Home Equipment: [place] - 4 wheels;Cane - single point;Bedside commode;Grab bars - tub/shower;Shower seat          Prior Functioning/Environment Level of Independence: Needs assistance;Independent with assistive device(s)  Gait / Transfers Assistance Needed: walks unassisted with  walker or cane ADL's / Homemaking Assistance Needed: spouse helps in shower (has caregiver as well--- 3x/wk 4 hours/day)                 OT Goals(Current goals can be found in the care plan section) Acute Rehab OT Goals Patient Stated Goal: To go home  OT Frequency:                End of Session Equipment Utilized During Treatment: Gait belt;Rolling walker Nurse Communication:  (Arm band came off, IV wrap undone, and pt appears to be having discharge in vaginal/pubic area (pt has urostomy so this is confusing))  Activity Tolerance: Patient tolerated treatment well Patient left: in chair;with call bell/phone within reach;with family/visitor present   Time: 1202-1235 OT Time Calculation (min): 33 min Charges:  OT General Charges $OT Visit: 1 Procedure OT Evaluation $Initial OT Evaluation Tier I: 1 Procedure OT Treatments $Self Care/Home Management : 23-37 mins     Almon Register [phone] 03/04/2014, 12:58 PM

## 2014-03-04 NOTE — Progress Notes (Signed)
Patient ID: [name], female   DOB: 1941-03-12, 73 y.o.   MRN: [id number]  [facility] PROGRESS NOTE  KRIMSON MASSMANN HCW:[id number] DOB: Jan 06, 1941 DOA: 02/28/2014 PCP: Florina Ou, MD  Brief narrative:  73 yo former smoker presented to ED with Fever 104F, altered mental status, and hypotension with elevated lactic acid from UTI, L pyelonephritis. 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   Assessment and plan:  Atelectasis  - Oxygen as needed to keep SpO2 > 92%  - Bronchial hygiene  - F/u CXR as needed  - Mobilize, PT attempted this AM  Severe sepsis 2nd to UTI and GNR bacteremia  - Hx of HTN.  - Elevated troponin >> likely 2nd to demand ischemia>>peak 3.34, trending back down, echo ok  - resumed Altace per home medical regimen  - changed ABX to cipro, total 14 days of treatment  AKI (baseline creatinine 0.8) >> 2nd to Lt hydronephrosis and UTI.  - Hx of bladder ca with urostomy.  - supplement electrolytes as indicated, potassium is still low as well as Mg, give K-dur and Mg 4 gm IV - Cr trending down and WNL this AM  - repeat BMP in AM, check Mg level in AM  Hx of GIB, NASH with cirrhosis and varices.  - Protonix q12h  - Regular diet as tolerated Chronic iron deficiency anemia,  - leukopenia/thrombocytopenia >> followed by Dr. Jonette Eva as outpt.  - Hg remains stable overall with no signs of active bleeding  Thrombocytopenia  - SCD's, no signs of active bleeding  - CBC in AM  Acute encephalopathy 2nd to sepsis and AKI- improving slowly.  - Safety issue due to dementia   SIGNIFICANT EVENTS:  6/09 Admit, urology consulted(no nephrostomy at this time.)  6/10 + trop, hematology consulted  6/12 [facility]  STUDIES:  6/09 CT abd/pelvis >> mod HH,  paraesophageal varices, splenomegaly, cirrhosis, non-occlusive thrombosis main portal vein, mild Lt hydronephrosis  6/10 renal us>>no hydro  6/10 2D echo>>ef 55-60%, mod dilated LA and RA, moderate LVH, PA pressure 52 mmHg  6/11 CXR Mild left lower lobe atelectasis.  LINES / TUBES:  6/9 Lt IJ CVL >>  CULTURES:  6/9 bc x 2>>gnr>>EColi (sens cipro)  6/9 uc>>Ecoli (sens cipro)  ANTIBIOTICS:  6/9 Vanc>> 6/10  6/9 cefepime(pcn all)>>6/11  6/11 Cipro>>> plan at least 14 days total abx  Consultants:  Urology Oncology   Code Status: Full  Family Communication: Pt and husband at bedside  Disposition Plan: Remains inpatient   HPI/Subjective: No events overnight.   Objective: Filed Vitals:   03/03/14 0609 03/03/14 1314 03/03/14 2135 03/04/14 0643  BP: 147/65 153/90 154/73 172/84  Pulse: 66 71 71 69  Temp: 97.9 F (36.6 C) 98.4 F (36.9 C) 100.2 F (37.9 C) 98.6 F (37 C)  TempSrc: Oral Oral Oral Oral  Resp: 18 20 20 20   Height:      Weight: 112.2 kg (247 lb 5.7 oz)   108.2 kg (238 lb 8.6 oz)  SpO2: 94% 96% 98% 98%    Intake/Output Summary (Last 24 hours) at 03/04/14 1513 Last data filed at 03/04/14 0645  Gross per 24 hour  Intake   1360 ml  Output   2350 ml  Net   -990 ml    Exam:   General:  Pt is alert,  follows commands appropriately, not in acute distress  Cardiovascular: Regular rate and rhythm, no rubs, no gallops  Respiratory: Clear to auscultation bilaterally, no wheezing, diminished breath sounds at bases   Abdomen: Soft, non tender, non distended, bowel sounds present, no guarding  Neuro: Grossly nonfocal  Data Reviewed: Basic Metabolic Panel:  Recent Labs Lab 02/28/14 0603 03/01/14 0615 03/02/14 0450 03/03/14 0315 03/04/14 0346  NA 140 142 144 143 140  K 3.7 4.0 3.6* 3.2* 2.7*  CL 101 105 110 110 106  CO2 19 20 22 22 26   GLUCOSE 85 81 83 115* 143*  BUN 17 33* 34* 20 11  CREATININE 1.12* 1.12* 0.77 0.54 0.49*  CALCIUM 9.4 7.9* 8.0* 8.6 8.9   MG  --  1.2*  --   --  1.4*  PHOS  --  4.5  --   --   --    Liver Function Tests:  Recent Labs Lab 02/28/14 0603 03/01/14 0615  AST 122* 99*  ALT 61* 56*  ALKPHOS 82 54  BILITOT 6.3* 4.8*  PROT 5.5* 5.0*  ALBUMIN 2.7* 2.4*   CBC:  Recent Labs Lab 02/28/14 0603 03/01/14 0615 03/02/14 0450 03/03/14 0315 03/04/14 0346  WBC 14.3* 20.9* 14.2* 8.1 4.7  HGB 13.4 12.0 11.5* 11.4* 11.3*  HCT 39.7 35.0* 33.4* 33.3* 32.6*  MCV 90.8 89.7 90.3 88.3 88.1  PLT 41* 16* 22* 20* 22*   Cardiac Enzymes:  Recent Labs Lab 02/28/14 1015 02/28/14 2023 03/01/14 0615 03/01/14 1045 03/02/14 0450  TROPONINI 0.68* 1.36* 2.65* 3.34* 1.29*   Recent Results (from the past 240 hour(s))  URINE CULTURE     Status: None   Collection Time    02/28/14  5:35 AM      Result Value Ref Range Status   Specimen Description URINE, RANDOM   Final   Special Requests Normal   Final   Culture  Setup Time     Final   Value: 02/28/2014 11:52     Performed at [place]     Final   Value: >=100,000 COLONIES/ML     Performed at [facility]   Culture     Final   Value: ESCHERICHIA COLI     Performed at [facility]   Report Status 03/02/2014 FINAL   Final   Organism ID, Bacteria ESCHERICHIA COLI   Final  CULTURE, BLOOD (ROUTINE X 2)     Status: None   Collection Time    02/28/14  5:53 AM      Result Value Ref Range Status   Specimen Description BLOOD LEFT ARM   Final   Special Requests BOTTLES DRAWN AEROBIC ONLY 5CC   Final   Culture  Setup Time     Final   Value: 02/28/2014 08:26     Performed at [facility]   Culture     Final   Value: ESCHERICHIA COLI     Note: SUSCEPTIBILITIES PERFORMED ON PREVIOUS CULTURE WITHIN THE LAST 5 DAYS.     Note: Gram Stain Report Called to,Read Back By and Verified With: MICHELLE REEVES ON 02/28/2014 AT 9:10P BY WILEJ     Performed at [facility]   Report Status 03/02/2014 FINAL   Final  CULTURE, BLOOD  (ROUTINE X 2)     Status: None   Collection Time    02/28/14  6:15 AM      Result Value Ref Range Status   Specimen Description BLOOD RIGHT  WRIST   Final   Special Requests BOTTLES DRAWN AEROBIC ONLY 5CC   Final   Culture  Setup Time     Final   Value: 02/28/2014 08:25     Performed at [facility]   Culture     Final   Value: ESCHERICHIA COLI     9 Note: Gram Stain Report Called to,Read Back By and Verified With: MICHELLE REEVES RN EDMOJ [phone]     Performed at [facility]   Report Status 03/02/2014 FINAL   Final   Organism ID, Bacteria ESCHERICHIA COLI   Final  MRSA PCR SCREENING     Status: None   Collection Time    02/28/14  9:00 AM      Result Value Ref Range Status   MRSA by PCR NEGATIVE  NEGATIVE Final   Comment:            The GeneXpert MRSA Assay (FDA     approved for NASAL specimens     only), is one component of a     comprehensive MRSA colonization     surveillance program. It is not     intended to diagnose MRSA     infection nor to guide or     monitor treatment for     MRSA infections.     Scheduled Meds: . antiseptic oral rinse  15 mL Mouth Rinse q12n4p  . chlorhexidine  15 mL Mouth Rinse BID  . ciprofloxacin  400 mg Intravenous 3 times per day  . pantoprazole  40 mg Oral BID AC  . ramipril  5 mg Oral Daily   Continuous Infusions:    Faye Ramsay, MD  [facility] Pager [phone]  If 7PM-7AM, please contact night-coverage [email or web address] Password [email or web address] 03/04/2014, 3:13 PM   LOS: 4 days

## 2014-03-04 NOTE — Evaluation (Signed)
Physical Therapy Evaluation Patient Details Name: [name] MRN: [id number] DOB: Jan 17, 1941 Today's Date: 03/04/2014   History of Present Illness  Patient is a 73 y/o female with h/o bladder CA with urostomoy and colon CA  and dementia.  She was admitted with pyelonephritis.  Clinical Impression  Patient presents with decreased independence with mobility due to deficits listed in PT progress note.  She will benefit from skilled PT in the acute setting to allow return home with spouse and caregiver assist and HHPT.    Follow Up Recommendations Supervision/Assistance - 24 hour;Home health PT    Equipment Recommendations  None recommended by PT    Recommendations for Other Services       Precautions / Restrictions Precautions Precautions: Fall      Mobility  Bed Mobility Overal bed mobility: Needs Assistance Bed Mobility: Supine to Sit     Supine to sit: Min assist;HOB elevated     General bed mobility comments: increased time to complete tasks with multimodal cues  Transfers Overall transfer level: Needs assistance Equipment used: Rolling walker (2 wheeled) Transfers: Sit to/from Stand Sit to Stand: Min assist;Supervision         General transfer comment: assist from low surface of bed, supervision from higher recliner; cues for safety with hand placement  Ambulation/Gait Ambulation/Gait assistance: Min assist Ambulation Distance (Feet): 75 Feet Assistive device: Rolling walker (2 wheeled) Gait Pattern/deviations: Step-through pattern;Trunk flexed;Shuffle;Antalgic     General Gait Details: c/o pain left ankle with weight bearing  Stairs            Wheelchair Mobility    Modified Rankin (Stroke Patients Only)       Balance Overall balance assessment: Needs assistance;History of Falls Sitting-balance support: Feet supported Sitting balance-Leahy Scale: Good     Standing balance support: Bilateral upper extremity supported Standing  balance-Leahy Scale: Poor Standing balance comment: needs UE support for balance and supervision for safety                             Pertinent Vitals/Pain C/o pain left ankle with weight bearing    Home Living Family/patient expects to be discharged to:: Private residence Living Arrangements: Spouse/significant other Available Help at Discharge: Family;Personal care attendant;Available 24 hours/day Type of Home: House Home Access: Stairs to enter Entrance Stairs-Rails: Psychiatric nurse of Steps: 3 Home Layout: Two level;Able to live on main level with bedroom/bathroom Home Equipment: Walker - 4 wheels;Cane - single point;Bedside commode;Grab bars - tub/shower      Prior Function Level of Independence: Needs assistance;Independent with assistive device(s)   Gait / Transfers Assistance Needed: walks unassisted with walker or cane  ADL's / Homemaking Assistance Needed: spouse helps in shower (has caregiver as well unknown schedule)        Hand Dominance   Dominant Hand: Left    Extremity/Trunk Assessment               Lower Extremity Assessment: Generalized weakness         Communication   Communication: No difficulties  Cognition Arousal/Alertness: Awake/alert Behavior During Therapy: WFL for tasks assessed/performed Overall Cognitive Status: History of cognitive impairments - at baseline                      General Comments      Exercises        Assessment/Plan    PT Assessment Patient needs continued PT services  PT Diagnosis Difficulty walking;Generalized weakness   PT Problem List Decreased strength;Decreased balance;Pain;Decreased mobility;Decreased safety awareness  PT Treatment Interventions DME instruction;Gait training;Functional mobility training;Stair training;Therapeutic activities;Patient/family education;Balance training;Therapeutic exercise   PT Goals (Current goals can be found in the Care Plan  section) Acute Rehab PT Goals Patient Stated Goal: To go home PT Goal Formulation: With patient/family Time For Goal Achievement: 03/18/14 Potential to Achieve Goals: Good    Frequency Min 3X/week   Barriers to discharge        Co-evaluation               End of Session Equipment Utilized During Treatment: Gait belt Activity Tolerance: Patient limited by pain Patient left: in bed;with call bell/phone within reach;with family/visitor present;with nursing/sitter in room           Time: 0913-0940 PT Time Calculation (min): 27 min   Charges:   PT Evaluation $Initial PT Evaluation Tier I: 1 Procedure PT Treatments $Therapeutic Exercise: 8-22 mins   PT G Codes:          [name],[name] Mar 15, 2014, 10:27 AM [name], [place] 2014-03-15

## 2014-03-04 NOTE — Progress Notes (Addendum)
CRITICAL VALUE ALERT  Critical value received:  K+ 2.7  Date of notification:  03/04/14  Time of notification: 8102  Critical value read back:yes  Nurse who received alert:  Azzie Glatter, RN  MD notified (1st page): Rogue Bussing  Time of first page:  [phone]  MD notified (2nd page): N/A  Time of second page: N/A  Responding MD:  Rogue Bussing  Time MD responded: 0500 (orders received for 4 runs of K+)

## 2014-03-05 DIAGNOSIS — R109 Unspecified abdominal pain: Secondary | ICD-10-CM | POA: Diagnosis not present

## 2014-03-05 DIAGNOSIS — K922 Gastrointestinal hemorrhage, unspecified: Secondary | ICD-10-CM | POA: Diagnosis not present

## 2014-03-05 DIAGNOSIS — R4182 Altered mental status, unspecified: Secondary | ICD-10-CM | POA: Diagnosis not present

## 2014-03-05 DIAGNOSIS — D62 Acute posthemorrhagic anemia: Secondary | ICD-10-CM | POA: Diagnosis not present

## 2014-03-05 LAB — BASIC METABOLIC PANEL
BUN: 11 mg/dL (ref 6–23)
CO2: 27 mEq/L (ref 19–32)
Calcium: 9.4 mg/dL (ref 8.4–10.5)
Chloride: 105 mEq/L (ref 96–112)
Creatinine, Ser: 0.53 mg/dL (ref 0.50–1.10)
GFR calc Af Amer: >90 mL/min (ref >90)
GLUCOSE: 122 mg/dL — AB (ref 70–99)
Potassium: 3.2 mEq/L — ABNORMAL LOW (ref 3.7–5.3)
Sodium: 140 mEq/L (ref 137–147)

## 2014-03-05 LAB — CBC
HEMATOCRIT: 31.8 % — AB (ref 36.0–46.0)
Hemoglobin: 11.1 g/dL — ABNORMAL LOW (ref 12.0–15.0)
MCH: 30.7 pg (ref 26.0–34.0)
MCHC: 34.9 g/dL (ref 30.0–36.0)
MCV: 87.8 fL (ref 78.0–100.0)
Platelets: 27 10*3/uL — CL (ref 150–400)
RBC: 3.62 MIL/uL — ABNORMAL LOW (ref 3.87–5.11)
RDW: 17.3 % — AB (ref 11.5–15.5)
WBC: 5.5 10*3/uL (ref 4.0–10.5)

## 2014-03-05 LAB — MAGNESIUM: Magnesium: 1.5 mg/dL (ref 1.5–2.5)

## 2014-03-05 MED ORDER — MAGNESIUM SULFATE 40 MG/ML IJ SOLN
2.0000 g | Freq: Once | INTRAMUSCULAR | Status: AC
  Administered 2014-03-05: 2 g via INTRAVENOUS
  Filled 2014-03-05: qty 50

## 2014-03-05 MED ORDER — POTASSIUM CHLORIDE CRYS ER 20 MEQ PO TBCR
40.0000 meq | EXTENDED_RELEASE_TABLET | Freq: Once | ORAL | Status: AC
  Administered 2014-03-05: 40 meq via ORAL
  Filled 2014-03-05: qty 2

## 2014-03-05 NOTE — Progress Notes (Signed)
Patient ID: [name], female   DOB: November 13, 1940, 73 y.o.   MRN: [id number]  [facility] PROGRESS NOTE  [name] LXB:[id number] DOB: [birth year] DOA: 02/28/2014 PCP: Florina Ou, MD  Brief narrative:  74 yo former smoker presented to ED with Fever 104F, altered mental status, and hypotension with elevated lactic acid from UTI, L pyelonephritis. 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   Assessment and plan:  Atelectasis  - Oxygen as needed to keep SpO2 > 92%  - Bronchial hygiene  - F/u CXR as needed  - Mobilize, PT again today, OOB to chair TID Severe sepsis 2nd to UTI and GNR bacteremia  - Hx of HTN.  - Elevated troponin >> likely 2nd to demand ischemia>>peak 3.34, trending back down, echo ok  - resumed Altace per home medical regimen  - changed ABX to cipro, total 14 days of treatment  AKI (baseline creatinine 0.8) >> 2nd to Lt hydronephrosis and UTI.  - Hx of bladder ca with urostomy.  - supplement electrolytes as indicated, potassium is still low as well as Mg, give K-dur and Mg 2 gm IV - Cr trending down and WNL this AM  - repeat BMP in AM, check Mg level in AM  Hx of GIB, NASH with cirrhosis and varices.  - Protonix q12h  - Regular diet as tolerated  Chronic iron deficiency anemia,  - leukopenia/thrombocytopenia >> followed by Dr. Jonette Eva as outpt.  - Hg remains stable overall with no signs of active bleeding  Thrombocytopenia  - SCD's, no signs of active bleeding  - Plt's trending up  - CBC in AM  Acute encephalopathy 2nd to sepsis and AKI- improving slowly.  - Safety issue due to dementia   SIGNIFICANT EVENTS:  6/09 Admit, urology consulted(no nephrostomy at this time.)  6/10 + trop, hematology consulted  6/12 [facility]  STUDIES:  6/09 CT  abd/pelvis >> mod HH, paraesophageal varices, splenomegaly, cirrhosis, non-occlusive thrombosis main portal vein, mild Lt hydronephrosis  6/10 renal us>>no hydro  6/10 2D echo>>ef 55-60%, mod dilated LA and RA, moderate LVH, PA pressure 52 mmHg  6/11 CXR Mild left lower lobe atelectasis.  LINES / TUBES:  6/9 Lt IJ CVL >>  CULTURES:  6/9 bc x 2>>gnr>>EColi (sens cipro)  6/9 uc>>Ecoli (sens cipro)  ANTIBIOTICS:  6/9 Vanc>> 6/10  6/9 cefepime(pcn all)>>6/11  6/11 Cipro>>> plan at least 14 days total abx   Consultants:  Urology  Oncology  Code Status: Full  Family Communication: Pt and husband, daughter at bedside  Disposition Plan: Remains inpatient   HPI/Subjective: No events overnight.   Objective: Filed Vitals:   03/04/14 0643 03/04/14 1518 03/04/14 2143 03/05/14 0604  BP: 172/84 135/67 141/54 149/55  Pulse: 69 75 74 76  Temp: 98.6 F (37 C) 98.7 F (37.1 C) 97.8 F (36.6 C) 97.8 F (36.6 C)  TempSrc: Oral Oral Oral Oral  Resp: 20 20 20 20   Height:      Weight: 108.2 kg (238 lb 8.6 oz)   108.8 kg (239 lb 13.8 oz)  SpO2: 98% 95% 98% 93%    Intake/Output Summary (Last 24 hours) at 03/05/14 1238 Last data filed at 03/05/14 0607  Gross per 24 hour  Intake    250 ml  Output   1700 ml  Net  -1450 ml  Exam:   General:  Pt is alert, follows commands appropriately, not in acute distress  Cardiovascular: Regular rate and rhythm, no rubs, no gallops  Respiratory: Clear to auscultation bilaterally, no wheezing, diminished breath sounds at bases   Abdomen: Soft, non tender, non distended, bowel sounds present, no guarding  Extremities: pulses DP and PT palpable bilaterally  Neuro: Grossly nonfocal  Data Reviewed: Basic Metabolic Panel:  Recent Labs Lab 02/28/14 0603 03/01/14 0615 03/02/14 0450 03/03/14 0315 03/04/14 0346 03/05/14 0545  NA 140 142 144 143 140 140  K 3.7 4.0 3.6* 3.2* 2.7* 3.2*  CL 101 105 110 110 106 105  CO2 19 20 22 22 26 27    GLUCOSE 85 81 83 115* 143* 122*  BUN 17 33* 34* 20 11 11   CREATININE 1.12* 1.12* 0.77 0.54 0.49* 0.53  CALCIUM 9.4 7.9* 8.0* 8.6 8.9 9.4  MG  --  1.2*  --   --  1.4* 1.5  PHOS  --  4.5  --   --   --   --    Liver Function Tests:  Recent Labs Lab 02/28/14 0603 03/01/14 0615  AST 122* 99*  ALT 61* 56*  ALKPHOS 82 54  BILITOT 6.3* 4.8*  PROT 5.5* 5.0*  ALBUMIN 2.7* 2.4*   CBC:  Recent Labs Lab 03/01/14 0615 03/02/14 0450 03/03/14 0315 03/04/14 0346 03/05/14 0545  WBC 20.9* 14.2* 8.1 4.7 5.5  HGB 12.0 11.5* 11.4* 11.3* 11.1*  HCT 35.0* 33.4* 33.3* 32.6* 31.8*  MCV 89.7 90.3 88.3 88.1 87.8  PLT 16* 22* 20* 22* 27*   Cardiac Enzymes:  Recent Labs Lab 02/28/14 1015 02/28/14 2023 03/01/14 0615 03/01/14 1045 03/02/14 0450  TROPONINI 0.68* 1.36* 2.65* 3.34* 1.29*   Recent Results (from the past 240 hour(s))  URINE CULTURE     Status: None   Collection Time    02/28/14  5:35 AM      Result Value Ref Range Status   Specimen Description URINE, RANDOM   Final   Special Requests Normal   Final   Culture  Setup Time     Final   Value: 02/28/2014 11:52     Performed at [place]     Final   Value: >=100,000 COLONIES/ML     Performed at [facility]   Culture     Final   Value: ESCHERICHIA COLI     Performed at [facility]   Report Status 03/02/2014 FINAL   Final   Organism ID, Bacteria ESCHERICHIA COLI   Final  CULTURE, BLOOD (ROUTINE X 2)     Status: None   Collection Time    02/28/14  5:53 AM      Result Value Ref Range Status   Specimen Description BLOOD LEFT ARM   Final   Special Requests BOTTLES DRAWN AEROBIC ONLY 5CC   Final   Culture  Setup Time     Final   Value: 02/28/2014 08:26     Performed at [facility]   Culture     Final   Value: ESCHERICHIA COLI     Note: SUSCEPTIBILITIES PERFORMED ON PREVIOUS CULTURE WITHIN THE LAST 5 DAYS.     Note: Gram Stain Report Called to,Read Back By and Verified  With: MICHELLE REEVES ON 02/28/2014 AT 9:10P BY Dennard Nip     Performed at [facility]   Report Status 03/02/2014 FINAL   Final  CULTURE, BLOOD (ROUTINE X 2)  Status: None   Collection Time    02/28/14  6:15 AM      Result Value Ref Range Status   Specimen Description BLOOD RIGHT WRIST   Final   Special Requests BOTTLES DRAWN AEROBIC ONLY 5CC   Final   Culture  Setup Time     Final   Value: 02/28/2014 08:25     Performed at [facility]   Culture     Final   Value: ESCHERICHIA COLI     9 Note: Gram Stain Report Called to,Read Back By and Verified With: MICHELLE REEVES RN EDMOJ [phone]     Performed at [facility]   Report Status 03/02/2014 FINAL   Final   Organism ID, Bacteria ESCHERICHIA COLI   Final  MRSA PCR SCREENING     Status: None   Collection Time    02/28/14  9:00 AM      Result Value Ref Range Status   MRSA by PCR NEGATIVE  NEGATIVE Final   Comment:            The GeneXpert MRSA Assay (FDA     approved for NASAL specimens     only), is one component of a     comprehensive MRSA colonization     surveillance program. It is not     intended to diagnose MRSA     infection nor to guide or     monitor treatment for     MRSA infections.     Scheduled Meds: . antiseptic oral rinse  15 mL Mouth Rinse q12n4p  . chlorhexidine  15 mL Mouth Rinse BID  . ciprofloxacin  400 mg Intravenous 3 times per day  . pantoprazole  40 mg Oral BID AC  . ramipril  5 mg Oral Daily   Continuous Infusions:    Faye Ramsay, MD  [facility] Pager [phone]  If 7PM-7AM, please contact night-coverage [email or web address] Password [email or web address] 03/05/2014, 12:38 PM   LOS: 5 days

## 2014-03-06 DIAGNOSIS — A498 Other bacterial infections of unspecified site: Secondary | ICD-10-CM | POA: Diagnosis not present

## 2014-03-06 DIAGNOSIS — K922 Gastrointestinal hemorrhage, unspecified: Secondary | ICD-10-CM | POA: Diagnosis not present

## 2014-03-06 DIAGNOSIS — R109 Unspecified abdominal pain: Secondary | ICD-10-CM | POA: Diagnosis not present

## 2014-03-06 DIAGNOSIS — R4182 Altered mental status, unspecified: Secondary | ICD-10-CM | POA: Diagnosis not present

## 2014-03-06 DIAGNOSIS — R7881 Bacteremia: Secondary | ICD-10-CM | POA: Diagnosis not present

## 2014-03-06 DIAGNOSIS — D649 Anemia, unspecified: Secondary | ICD-10-CM | POA: Diagnosis not present

## 2014-03-06 DIAGNOSIS — D62 Acute posthemorrhagic anemia: Secondary | ICD-10-CM | POA: Diagnosis not present

## 2014-03-06 LAB — BASIC METABOLIC PANEL
BUN: 12 mg/dL (ref 6–23)
CO2: 26 meq/L (ref 19–32)
CREATININE: 0.54 mg/dL (ref 0.50–1.10)
Calcium: 9.4 mg/dL (ref 8.4–10.5)
Chloride: 107 mEq/L (ref 96–112)
GFR calc Af Amer: >90 mL/min (ref >90)
GLUCOSE: 121 mg/dL — AB (ref 70–99)
Potassium: 3.3 mEq/L — ABNORMAL LOW (ref 3.7–5.3)
SODIUM: 140 meq/L (ref 137–147)

## 2014-03-06 LAB — CBC
HCT: 30.8 % — ABNORMAL LOW (ref 36.0–46.0)
HEMOGLOBIN: 10.9 g/dL — AB (ref 12.0–15.0)
MCH: 31.4 pg (ref 26.0–34.0)
MCHC: 35.4 g/dL (ref 30.0–36.0)
MCV: 88.8 fL (ref 78.0–100.0)
PLATELETS: 33 10*3/uL — AB (ref 150–400)
RBC: 3.47 MIL/uL — ABNORMAL LOW (ref 3.87–5.11)
RDW: 17.3 % — ABNORMAL HIGH (ref 11.5–15.5)
WBC: 5.6 10*3/uL (ref 4.0–10.5)

## 2014-03-06 LAB — MAGNESIUM: Magnesium: 1.5 mg/dL (ref 1.5–2.5)

## 2014-03-06 MED ORDER — CIPROFLOXACIN HCL 750 MG PO TABS
750.0000 mg | ORAL_TABLET | Freq: Two times a day (BID) | ORAL | Status: DC
  Administered 2014-03-06 – 2014-03-07 (×3): 750 mg via ORAL
  Filled 2014-03-06 (×5): qty 1

## 2014-03-06 MED ORDER — POTASSIUM CHLORIDE CRYS ER 20 MEQ PO TBCR
40.0000 meq | EXTENDED_RELEASE_TABLET | Freq: Two times a day (BID) | ORAL | Status: AC
  Administered 2014-03-06 (×2): 40 meq via ORAL
  Filled 2014-03-06 (×2): qty 2

## 2014-03-06 NOTE — Progress Notes (Signed)
Platelet count is trending up slowly. This is no surprise as the Escherichia coli bacteremia and is being treated.  She is doing fairly well. Her hemoglobin is down a little bit. There is no bleeding.  Appetite seems to be adequate. She has no nausea or vomiting.  Her vital signs look okay. Her blood pressure is up a little bit at 170/59. No real change on her physical exam.  Hopefully, she will be a little too go home soon. Oral antibiotics I am sure will be adequate.  [place] 91:1-2

## 2014-03-06 NOTE — Progress Notes (Signed)
Physical Therapy Treatment Patient Details Name: [name] MRN: [id number] DOB: Apr 29, 1941 Today's Date: 03/06/2014    History of Present Illness Patient is a 73 y/o female with h/o bladder CA with urostomoy and colon CA  and dementia.  She was admitted with pyelonephritis.    PT Comments    Pt pleasantly confused.  Assisted out of recliner to amb to BR.  Assisted with hygiene.  Pt required 25% VC's for direction and 50% VC's for safety.  Pt required increased time to amb in hallway.  Spouse was present and very helpful.  Pt is use to amb with her 4WW so using RW required increased cueing and direction.    Follow Up Recommendations  Supervision/Assistance - 24 hour;Home health PT (spouse will assist at home)     Equipment Recommendations  None recommended by PT (has a 4 [employer])    Recommendations for Other Services       Precautions / Restrictions      Mobility  Bed Mobility               General bed mobility comments: Pt up in recliner upon arrival  Transfers Overall transfer level: Needs assistance Equipment used: Rolling walker (2 wheeled) Transfers: Sit to/from Stand Sit to Stand: Min assist         General transfer comment: increased time and VC's for safety.  Pt prefers pulling up on walker and spouse holds walker.  Ambulation/Gait Ambulation/Gait assistance: Min assist Ambulation Distance (Feet): 115 Feet Assistive device: Rolling walker (2 wheeled) Gait Pattern/deviations: Step-through pattern;Trunk flexed;Shuffle Gait velocity: decreased   General Gait Details: pt is use to her 4 WW and tends to push RW too far to front.  50% VC's on upright posture and safety with turns.   Stairs            Wheelchair Mobility    Modified Rankin (Stroke Patients Only)       Balance                                    Cognition                            Exercises      General Comments        Pertinent  Vitals/Pain C/o L knee pain     Home Living                      Prior Function            PT Goals (current goals can now be found in the care plan section) Progress towards PT goals: Progressing toward goals    Frequency  Min 3X/week    PT Plan      Co-evaluation             End of Session Equipment Utilized During Treatment: Gait belt Activity Tolerance: Patient limited by fatigue Patient left: in chair;with call bell/phone within reach;with family/visitor present     Time: 1345-1410 PT Time Calculation (min): 25 min  Charges:  $Gait Training: 8-22 mins $Therapeutic Activity: 8-22 mins                    G Codes:      Rica Koyanagi  PTA [facility]  Acute  Rehab Pager      [phone]

## 2014-03-06 NOTE — Progress Notes (Signed)
PHARMACIST - PHYSICIAN COMMUNICATION DR:   Doyle Askew CONCERNING: Antibiotic IV to Oral Route Change Policy  RECOMMENDATION: This patient is receiving Cipro by the intravenous route.  Based on criteria approved by the Pharmacy and Therapeutics Committee, the antibiotic(s) is/are being converted to the equivalent oral dose form(s).   DESCRIPTION: These criteria include:  Patient being treated for a respiratory tract infection, urinary tract infection, cellulitis or clostridium difficile associated diarrhea if on metronidazole  The patient is not neutropenic and does not exhibit a GI malabsorption state  The patient is eating (either orally or via tube) and/or has been taking other orally administered medications for a least 24 hours  The patient is improving clinically and has a Tmax < 100.5  If you have questions about this conversion, please contact the Pharmacy Department  []   [phone] )  [facility] []   [phone] )  [facility]  []   [phone] )  [facility] [x]   [phone] )  [place], PharmD, [place] Pager [phone] 03/06/2014 8:56 AM

## 2014-03-06 NOTE — Progress Notes (Signed)
Patient ID: [name], female   DOB: 10-07-1940, 73 y.o.   MRN: [id number]  [facility] PROGRESS NOTE  EMBERLIE GOTCHER RFF:[id number] DOB: 1941/01/07 DOA: 02/28/2014 PCP: Florina Ou, MD  Brief narrative:  73 yo former smoker presented to ED with Fever 104F, altered mental status, and hypotension with elevated lactic acid from UTI, L pyelonephritis. 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   Assessment and plan:  Atelectasis  - Oxygen as needed to keep SpO2 > 92%  - Bronchial hygiene  - F/u CXR as needed  - Mobilize, PT again today, OOB to chair TID  Severe sepsis 2nd to UTI and GNR bacteremia  - Hx of HTN.  - Elevated troponin >> likely 2nd to demand ischemia>>peak 3.34, trending back down, echo ok  - resumed Altace per home medical regimen  - changed ABX to cipro, total 14 days of treatment  - will transition to oral ABX today  AKI (baseline creatinine 0.8) >> 2nd to Lt hydronephrosis and UTI.  - Hx of bladder ca with urostomy.  - supplement electrolytes as indicated, potassium is still low as well as Mg, give K-dur and Mg 2 gm IV  - Cr trending down and WNL this AM  - repeat BMP in AM, check Mg level in AM  Hx of GIB, NASH with cirrhosis and varices.  - Protonix q12h  - Regular diet as tolerated  Chronic iron deficiency anemia,  - leukopenia/thrombocytopenia >> followed by Dr. Jonette Eva  - Hg remains stable overall with no signs of active bleeding  Thrombocytopenia  - SCD's, no signs of active bleeding  - Plt's trending up  - CBC in AM  Acute encephalopathy 2nd to sepsis and AKI- improving slowly.  - Safety issue due to dementia   SIGNIFICANT EVENTS:  6/09 Admit, urology consulted(no nephrostomy at this time.)  6/10 + trop, hematology consulted  6/12 [facility] to [facility]  STUDIES:  6/09 CT abd/pelvis >> mod HH, paraesophageal varices, splenomegaly, cirrhosis, non-occlusive thrombosis main portal vein, mild Lt hydronephrosis  6/10 renal us>>no hydro  6/10 2D echo>>ef 55-60%, mod dilated LA and RA, moderate LVH, PA pressure 52 mmHg  6/11 CXR Mild left lower lobe atelectasis.  LINES / TUBES:  6/9 Lt IJ CVL >>  CULTURES:  6/9 bc x 2>>gnr>>EColi (sens cipro)  6/9 uc>>Ecoli (sens cipro)  ANTIBIOTICS:  6/9 Vanc>> 6/10  6/9 cefepime(pcn all)>>6/11  6/11 Cipro>>> plan at least 14 days total abx   Consultants:  Urology  Oncology  Code Status: Full  Family Communication: Pt and husband, daughter at bedside  Disposition Plan: Remains inpatient    HPI/Subjective: No events overnight.   Objective: Filed Vitals:   03/05/14 0604 03/05/14 1315 03/05/14 2015 03/06/14 0553  BP: 149/55 141/84 144/71 170/59  Pulse: 76 78 74 71  Temp: 97.8 F (36.6 C) 98.6 F (37 C) 98.2 F (36.8 C) 97.6 F (36.4 C)  TempSrc: Oral Oral Oral Oral  Resp: 20 20 16 16   Height:      Weight: 108.8 kg (239 lb 13.8 oz)   111.3 kg (245 lb 6 oz)  SpO2: 93% 98% 98% 96%    Intake/Output Summary (Last 24 hours) at 03/06/14 0732 Last data filed at 03/06/14 0553  Gross per 24 hour  Intake    200 ml  Output  1600 ml  Net  -1400 ml    Exam:   General:  Pt is alert, follows commands appropriately, not in acute distress  Cardiovascular: Regular rate and rhythm, no rubs, no gallops  Respiratory: Clear to auscultation bilaterally, no wheezing, no crackles, no rhonchi  Abdomen: Soft, non tender, non distended, bowel sounds present, no guarding  Extremities: No edema, pulses DP and PT palpable bilaterally  Data Reviewed: Basic Metabolic Panel:  Recent Labs Lab 02/28/14 0603 03/01/14 0615 03/02/14 0450 03/03/14 0315 03/04/14 0346 03/05/14 0545 03/06/14 0404  NA 140 142 144 143 140 140 140  K 3.7 4.0 3.6* 3.2* 2.7* 3.2* 3.3*  CL 101 105 110 110 106  105 107  CO2 19 20 22 22 26 27 26   GLUCOSE 85 81 83 115* 143* 122* 121*  BUN 17 33* 34* 20 11 11 12   CREATININE 1.12* 1.12* 0.77 0.54 0.49* 0.53 0.54  CALCIUM 9.4 7.9* 8.0* 8.6 8.9 9.4 9.4  MG  --  1.2*  --   --  1.4* 1.5 1.5  PHOS  --  4.5  --   --   --   --   --    Liver Function Tests:  Recent Labs Lab 02/28/14 0603 03/01/14 0615  AST 122* 99*  ALT 61* 56*  ALKPHOS 82 54  BILITOT 6.3* 4.8*  PROT 5.5* 5.0*  ALBUMIN 2.7* 2.4*   No results found for this basename: LIPASE, AMYLASE,  in the last 168 hours No results found for this basename: AMMONIA,  in the last 168 hours CBC:  Recent Labs Lab 03/02/14 0450 03/03/14 0315 03/04/14 0346 03/05/14 0545 03/06/14 0404  WBC 14.2* 8.1 4.7 5.5 5.6  HGB 11.5* 11.4* 11.3* 11.1* 10.9*  HCT 33.4* 33.3* 32.6* 31.8* 30.8*  MCV 90.3 88.3 88.1 87.8 88.8  PLT 22* 20* 22* 27* 33*   Cardiac Enzymes:  Recent Labs Lab 02/28/14 1015 02/28/14 2023 03/01/14 0615 03/01/14 1045 03/02/14 0450  TROPONINI 0.68* 1.36* 2.65* 3.34* 1.29*   BNP: No components found with this basename: POCBNP,  CBG: No results found for this basename: GLUCAP,  in the last 168 hours  Recent Results (from the past 240 hour(s))  URINE CULTURE     Status: None   Collection Time    02/28/14  5:35 AM      Result Value Ref Range Status   Specimen Description URINE, RANDOM   Final   Special Requests Normal   Final   Culture  Setup Time     Final   Value: 02/28/2014 11:52     Performed at [place]     Final   Value: >=100,000 COLONIES/ML     Performed at [facility]   Culture     Final   Value: ESCHERICHIA COLI     Performed at [facility]   Report Status 03/02/2014 FINAL   Final   Organism ID, Bacteria ESCHERICHIA COLI   Final  CULTURE, BLOOD (ROUTINE X 2)     Status: None   Collection Time    02/28/14  5:53 AM      Result Value Ref Range Status   Specimen Description BLOOD LEFT ARM   Final   Special  Requests BOTTLES DRAWN AEROBIC ONLY 5CC   Final   Culture  Setup Time     Final   Value: 02/28/2014 08:26     Performed at [facility]  Final   Value: ESCHERICHIA COLI     Note: SUSCEPTIBILITIES PERFORMED ON PREVIOUS CULTURE WITHIN THE LAST 5 DAYS.     Note: Gram Stain Report Called to,Read Back By and Verified With: MICHELLE REEVES ON 02/28/2014 AT 9:10P BY WILEJ     Performed at [facility]   Report Status 03/02/2014 FINAL   Final  CULTURE, BLOOD (ROUTINE X 2)     Status: None   Collection Time    02/28/14  6:15 AM      Result Value Ref Range Status   Specimen Description BLOOD RIGHT WRIST   Final   Special Requests BOTTLES DRAWN AEROBIC ONLY 5CC   Final   Culture  Setup Time     Final   Value: 02/28/2014 08:25     Performed at [facility]   Culture     Final   Value: ESCHERICHIA COLI     9 Note: Gram Stain Report Called to,Read Back By and Verified With: MICHELLE REEVES RN EDMOJ [phone]     Performed at [facility]   Report Status 03/02/2014 FINAL   Final   Organism ID, Bacteria ESCHERICHIA COLI   Final  MRSA PCR SCREENING     Status: None   Collection Time    02/28/14  9:00 AM      Result Value Ref Range Status   MRSA by PCR NEGATIVE  NEGATIVE Final   Comment:            The GeneXpert MRSA Assay (FDA     approved for NASAL specimens     only), is one component of a     comprehensive MRSA colonization     surveillance program. It is not     intended to diagnose MRSA     infection nor to guide or     monitor treatment for     MRSA infections.     Scheduled Meds: . antiseptic oral rinse  15 mL Mouth Rinse q12n4p  . chlorhexidine  15 mL Mouth Rinse BID  . ciprofloxacin  400 mg Intravenous 3 times per day  . pantoprazole  40 mg Oral BID AC  . ramipril  5 mg Oral Daily   Continuous Infusions:    Faye Ramsay, MD  [facility] Pager [phone]  If 7PM-7AM, please contact night-coverage [email or web address] Password  [email or web address] 03/06/2014, 7:32 AM   LOS: 6 days

## 2014-03-07 DIAGNOSIS — R7881 Bacteremia: Secondary | ICD-10-CM | POA: Diagnosis not present

## 2014-03-07 DIAGNOSIS — R109 Unspecified abdominal pain: Secondary | ICD-10-CM | POA: Diagnosis not present

## 2014-03-07 DIAGNOSIS — R4182 Altered mental status, unspecified: Secondary | ICD-10-CM | POA: Diagnosis not present

## 2014-03-07 DIAGNOSIS — D649 Anemia, unspecified: Secondary | ICD-10-CM | POA: Diagnosis not present

## 2014-03-07 DIAGNOSIS — A498 Other bacterial infections of unspecified site: Secondary | ICD-10-CM | POA: Diagnosis not present

## 2014-03-07 LAB — CBC
HEMATOCRIT: 31.6 % — AB (ref 36.0–46.0)
Hemoglobin: 10.7 g/dL — ABNORMAL LOW (ref 12.0–15.0)
MCH: 30.4 pg (ref 26.0–34.0)
MCHC: 33.9 g/dL (ref 30.0–36.0)
MCV: 89.8 fL (ref 78.0–100.0)
PLATELETS: 36 10*3/uL — AB (ref 150–400)
RBC: 3.52 MIL/uL — ABNORMAL LOW (ref 3.87–5.11)
RDW: 17.8 % — AB (ref 11.5–15.5)
WBC: 4.4 10*3/uL (ref 4.0–10.5)

## 2014-03-07 LAB — BASIC METABOLIC PANEL
BUN: 13 mg/dL (ref 6–23)
CALCIUM: 9.8 mg/dL (ref 8.4–10.5)
CO2: 24 mEq/L (ref 19–32)
CREATININE: 0.52 mg/dL (ref 0.50–1.10)
Chloride: 107 mEq/L (ref 96–112)
Glucose, Bld: 124 mg/dL — ABNORMAL HIGH (ref 70–99)
Potassium: 4.2 mEq/L (ref 3.7–5.3)
Sodium: 140 mEq/L (ref 137–147)

## 2014-03-07 MED ORDER — TRAMADOL HCL 50 MG PO TABS: 50.0000 mg | ORAL_TABLET | Freq: Four times a day (QID) | ORAL | Status: DC | PRN

## 2014-03-07 MED ORDER — CIPROFLOXACIN HCL 750 MG PO TABS: 750.0000 mg | ORAL_TABLET | Freq: Two times a day (BID) | ORAL | Status: DC

## 2014-03-07 MED ORDER — PANTOPRAZOLE SODIUM 40 MG PO TBEC: 40.0000 mg | DELAYED_RELEASE_TABLET | Freq: Two times a day (BID) | ORAL | Status: DC

## 2014-03-07 NOTE — Discharge Summary (Signed)
Physician Discharge Summary  [name] XFG:[id number] DOB: 09-02-1941 DOA: 02/28/2014  PCP: Florina Ou, MD  Admit date: 02/28/2014 Discharge date: 03/07/2014  Recommendations for Outpatient Follow-up:  1. Pt will need to follow up with PCP in 2-3 weeks post discharge 2. Please obtain BMP to evaluate electrolytes and kidney function 3. Please also check CBC to evaluate Hg and Hct levels 4. Please note patient was discharged on ciprofloxacin to complete therapy for 10 more days post discharge  Discharge Diagnoses: Sepsis, UTI, bacteremia, secondary to Escherichia coli Active Problems:   Sepsis   Severe sepsis   Hydronephrosis, left   UTI (urinary tract infection)  Discharge Condition: Stable  Diet recommendation: Heart healthy diet discussed in details   Brief narrative:  73 yo former smoker presented to ED with Fever 104F, altered mental status, and hypotension with elevated lactic acid from UTI, L pyelonephritis. 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   Assessment and plan:  Atelectasis  - Oxygen as needed to keep SpO2 > 92%  - Bronchial hygiene  - Mobilize, PT tolerated well, OOB to chair TID  Severe sepsis 2nd to UTI and GNR bacteremia  - Hx of HTN.  - Elevated troponin >> likely 2nd to demand ischemia>>peak 3.34, trending back down, echo ok  - resumed Altace per home medical regimen  - changed ABX to cipro, needs total 14 days of treatment, 10 more days upon discharge   AKI (baseline creatinine 0.8) >> 2nd to Lt hydronephrosis and UTI.  - Hx of bladder ca with urostomy.  - supplement electrolytes as indicated, potassium is still low as well as Mg, give K-dur and Mg 2 gm IV  - Cr trending down and WNL this AM  Hx of GIB, NASH with cirrhosis and varices.  - Protonix upon  discharge  - Regular diet as tolerated  Chronic iron deficiency anemia,  - leukopenia/thrombocytopenia >> followed by Dr. Jonette Eva  - Hg remains stable overall with no signs of active bleeding  Thrombocytopenia  - SCD's, no signs of active bleeding  - Plt's trending up  Acute encephalopathy 2nd to sepsis and AKI- improving slowly.  - Safety issue due to dementia discussed  SIGNIFICANT EVENTS:  6/09 Admit, urology consulted(no nephrostomy at this time.)  6/10 + trop, hematology consulted  6/12 [facility]  STUDIES:  6/09 CT abd/pelvis >> mod HH, paraesophageal varices, splenomegaly, cirrhosis, non-occlusive thrombosis main portal vein, mild Lt hydronephrosis  6/10 renal us>>no hydro  6/10 2D echo>>ef 55-60%, mod dilated LA and RA, moderate LVH, PA pressure 52 mmHg  6/11 CXR Mild left lower lobe atelectasis.  LINES / TUBES:  6/9 Lt IJ CVL >>  CULTURES:  6/9 bc x 2>>gnr>>EColi (sens cipro)  6/9 uc>>Ecoli (sens cipro)  ANTIBIOTICS:  6/9 Vanc>> 6/10  6/9 cefepime(pcn all)>>6/11  6/11 Cipro>>> 10 more days post discharge   Consultants:  Urology  Oncology  Code Status: Full  Family Communication: Pt and husband, daughter at bedside   Discharge Exam: Filed Vitals:   03/07/14 0626  BP: 141/62  Pulse: 70  Temp: 98.4 F (36.9 C)  Resp: 16   Filed Vitals:   03/06/14 0553 03/06/14 1440 03/06/14 2138 03/07/14 0626  BP: 170/59 138/69 136/59 141/62  Pulse: 71 76 73 70  Temp: 97.6 F (36.4 C) 98.3 F (36.8 C) 98.2  F (36.8 C) 98.4 F (36.9 C)  TempSrc: Oral Oral Oral Oral  Resp: 16 16 18 16   Height:      Weight: 111.3 kg (245 lb 6 oz)   111.177 kg (245 lb 1.6 oz)  SpO2: 96% 97% 98% 97%    General: Pt is alert, follows commands appropriately, not in acute distress Cardiovascular: Regular rate and rhythm, no rubs, no gallops Respiratory: Clear to auscultation bilaterally, no wheezing, no crackles, no rhonchi Abdominal: Soft, non tender, non distended,  bowel sounds +, no guarding Extremities: no edema, no cyanosis, pulses palpable bilaterally DP and PT Neuro: Grossly nonfocal  Discharge Instructions  Discharge Instructions   Diet - low sodium heart healthy    Complete by:  As directed      Increase activity slowly    Complete by:  As directed             Medication List         acetaminophen 500 MG tablet  Commonly known as:  TYLENOL  Take 1,000 mg by mouth every 6 (six) hours as needed for mild pain.     Cinnamon 500 MG capsule  Take 500 mg by mouth daily.     ciprofloxacin 750 MG tablet  Commonly known as:  CIPRO  Take 1 tablet (750 mg total) by mouth 2 (two) times daily.     citalopram 40 MG tablet  Commonly known as:  CELEXA  Take 20 mg by mouth daily.     fexofenadine 180 MG tablet  Commonly known as:  ALLEGRA  Take 180 mg by mouth daily.     fish oil-omega-3 fatty acids 1000 MG capsule  Take 2 g by mouth daily.     Ginkgo 60 MG Tabs  Take 60 mg by mouth every morning.     montelukast 10 MG tablet  Commonly known as:  SINGULAIR  Take 10 mg by mouth at bedtime.     MULTI VITAMIN DAILY PO  Take by mouth every morning.     pantoprazole 40 MG tablet  Commonly known as:  PROTONIX  Take 1 tablet (40 mg total) by mouth 2 (two) times daily before a meal.     ramipril 5 MG capsule  Commonly known as:  ALTACE  Take 5 mg by mouth Daily.     traMADol 50 MG tablet  Commonly known as:  ULTRAM  Take 1 tablet (50 mg total) by mouth every 6 (six) hours as needed for moderate pain.     VITAMIN B 12 PO  Take by mouth every morning.     Vitamin D3 1000 UNITS Caps  Take 1 capsule by mouth daily.           Follow-up Information   Schedule an appointment as soon as possible for a visit with Florina Ou, MD.   Specialty:  Family Medicine   Contact information:   [address] [place] [address] [phone]       Follow up with Faye Ramsay, MD. (As needed, If  symptoms worsen call my cell phone [phone])    Specialty:  Internal Medicine   Contact information:   [address] [place] [address] [phone]        The results of significant diagnostics from this hospitalization (including imaging, microbiology, ancillary and laboratory) are listed below for reference.     Microbiology: Recent Results (from the past 240 hour(s))  URINE CULTURE  Status: None   Collection Time    02/28/14  5:35 AM      Result Value Ref Range Status   Specimen Description URINE, RANDOM   Final   Special Requests Normal   Final   Culture  Setup Time     Final   Value: 02/28/2014 11:52     Performed at [facility]     Final   Value: >=100,000 COLONIES/ML     Performed at [facility]   Culture     Final   Value: ESCHERICHIA COLI     Performed at [facility]   Report Status 03/02/2014 FINAL   Final   Organism ID, Bacteria ESCHERICHIA COLI   Final  CULTURE, BLOOD (ROUTINE X 2)     Status: None   Collection Time    02/28/14  5:53 AM      Result Value Ref Range Status   Specimen Description BLOOD LEFT ARM   Final   Special Requests BOTTLES DRAWN AEROBIC ONLY 5CC   Final   Culture  Setup Time     Final   Value: 02/28/2014 08:26     Performed at [facility]   Culture     Final   Value: ESCHERICHIA COLI     Note: SUSCEPTIBILITIES PERFORMED ON PREVIOUS CULTURE WITHIN THE LAST 5 DAYS.     Note: Gram Stain Report Called to,Read Back By and Verified With: MICHELLE REEVES ON 02/28/2014 AT 9:10P BY WILEJ     Performed at [facility]   Report Status 03/02/2014 FINAL   Final  CULTURE, BLOOD (ROUTINE X 2)     Status: None   Collection Time    02/28/14  6:15 AM      Result Value Ref Range Status   Specimen Description BLOOD RIGHT WRIST   Final   Special Requests BOTTLES DRAWN AEROBIC ONLY 5CC   Final   Culture  Setup Time     Final   Value: 02/28/2014 08:25     Performed at [facility]   Culture     Final   Value: ESCHERICHIA COLI     9 Note: Gram Stain Report Called to,Read Back By and Verified With: MICHELLE REEVES RN EDMOJ [phone]     Performed at [facility]   Report Status 03/02/2014 FINAL   Final   Organism ID, Bacteria ESCHERICHIA COLI   Final  MRSA PCR SCREENING     Status: None   Collection Time    02/28/14  9:00 AM      Result Value Ref Range Status   MRSA by PCR NEGATIVE  NEGATIVE Final   Comment:            The GeneXpert MRSA Assay (FDA     approved for NASAL specimens     only), is one component of a     comprehensive MRSA colonization     surveillance program. It is not     intended to diagnose MRSA     infection nor to guide or     monitor treatment for     MRSA infections.     Labs: Basic Metabolic Panel:  Recent Labs Lab 03/01/14 0615  03/03/14 0315 03/04/14 0346 03/05/14 0545 03/06/14 0404 03/07/14 0351  NA 142  < > 143 140 140 140 140  K 4.0  < > 3.2* 2.7* 3.2* 3.3* 4.2  CL 105  < > 110  106 105 107 107  CO2 20  < > 22 26 27 26 24   GLUCOSE 81  < > 115* 143* 122* 121* 124*  BUN 33*  < > 20 11 11 12 13   CREATININE 1.12*  < > 0.54 0.49* 0.53 0.54 0.52  CALCIUM 7.9*  < > 8.6 8.9 9.4 9.4 9.8  MG 1.2*  --   --  1.4* 1.5 1.5  --   PHOS 4.5  --   --   --   --   --   --   < > = values in this interval not displayed. Liver Function Tests:  Recent Labs Lab 03/01/14 0615  AST 99*  ALT 56*  ALKPHOS 54  BILITOT 4.8*  PROT 5.0*  ALBUMIN 2.4*   No results found for this basename: LIPASE, AMYLASE,  in the last 168 hours No results found for this basename: AMMONIA,  in the last 168 hours CBC:  Recent Labs Lab 03/03/14 0315 03/04/14 0346 03/05/14 0545 03/06/14 0404 03/07/14 0351  WBC 8.1 4.7 5.5 5.6 4.4  HGB 11.4* 11.3* 11.1* 10.9* 10.7*  HCT 33.3* 32.6* 31.8* 30.8* 31.6*  MCV 88.3 88.1 87.8 88.8 89.8  PLT 20* 22* 27* 33* 36*   Cardiac Enzymes:  Recent Labs Lab 02/28/14 2023 03/01/14 0615  03/01/14 1045 03/02/14 0450  TROPONINI 1.36* 2.65* 3.34* 1.29*   BNP: BNP (last 3 results) No results found for this basename: PROBNP,  in the last 8760 hours CBG: No results found for this basename: GLUCAP,  in the last 168 hours   SIGNED: Time coordinating discharge: Over 30 minutes  Faye Ramsay, MD  [facility] 03/07/2014, 10:44 AM Pager [phone]  If 7PM-7AM, please contact night-coverage [email or web address] Password [email or web address]

## 2014-03-07 NOTE — Discharge Instructions (Signed)
Bacteremia Bacteremia occurs when bacteria get in your blood. Normal blood does not usually have bacteria. Bacteremia is one way infections can spread from one part of the body to another. CAUSES   Causes may include anything that allows bacteria to get into the body. Examples are:  Catheters.  Intravenous (IV) access tubes.  Cuts or scrapes of the skin.  Temporary bacteremia may occur during dental procedures, while brushing your teeth, or during a bowel movement. This rarely causes any symptoms or medical problems.  Bacteria may also get in the bloodstream as a complication of a bacterial infection elsewhere. This includes infected wounds and bacterial infections of the:  Lungs (pneumonia).  Kidneys (pyelonephritis).  Intestines (enteritis, colitis).  Organs in the abdomen (appendicitis, cholecystitis, diverticulitis). SYMPTOMS  The body is usually able to clear small numbers of bacteria out of the blood quickly. Brief bacteremia usually does not cause problems.   Problems can occur if the bacteria start to grow in number or spread to other parts of the body. If the bacteria start growing, you may develop:  Chills.  Fever.  Nausea.  Vomiting.  Sweating.  Lightheadedness and low blood pressure.  Pain.  If bacteria start to grow in the linings around the brain, it is called meningitis. This can cause severe headaches, many other problems, and even death.  If bacteria start to grow in a joint, it causes arthritis with painful joints. If bacteria start to grow in a bone, it is called osteomyelitis.  Bacteria from the blood can also cause sores (abscesses) in many organs, such as the muscle, liver, spleen, lungs, brain, and kidneys. DIAGNOSIS   This condition is diagnosed by cultures of the blood.  Cultures may also be taken from other parts of the body that are thought to be causing the bacteremia. A small piece of tissue, fluid, or other product of the body is  sampled. The sample is then put on a growth plate to see if any bacteria grows.  Other lab tests may be done and the results may be abnormal. TREATMENT  Treatment requires a stay in the hospital. You will be given antibiotic medicine through an IV access tube. PREVENTION  People with an increased risk of developing bacteremia or complications may be given antibiotics before certain procedures. Examples are:  A person with a heart murmur or artificial heart valve, before having his or her teeth cleaned.  Before having a surgical or other invasive procedure.  Before having a bowel procedure. Document Released: 06/22/2006 Document Revised: 12/01/2011 Document Reviewed: 04/03/2011 [facility] Patient Information 2014 [place].

## 2014-03-07 NOTE — Progress Notes (Signed)
Pt. Has been discharged home. Her discharge instructions and prescriptions were given to pt.'s husband and all questions were answered. She is to be transported home by her husband.

## 2014-03-07 NOTE — Care Management Note (Signed)
    Page 1 of 1   03/07/2014     12:41:06 PM CARE MANAGEMENT NOTE 03/07/2014  Patient:  [name], [name]   Account Number:  [id number]  Date Initiated:  03/03/2014  Documentation initiated by:  Sunday Spillers  Subjective/Objective Assessment:   73 yo female admitted with sepsis. PTA lived at home with spouse.     Action/Plan:   Home when stable   Anticipated DC Date:  03/06/2014   Anticipated DC Plan:  [place]  CM consult      [facility] Choice  HOME HEALTH   Choice offered to / List presented to:  C-1 Patient        [place] arranged  [place].   Status of service:  Completed, signed off Medicare Important Message given?  YES (If response is "NO", the following Medicare IM given date fields will be blank) Date Medicare IM given:   Date Additional Medicare IM given:  03/07/2014  Discharge Disposition:  [place]  Per UR Regulation:  Reviewed for med. necessity/level of care/duration of stay  If discussed at [place] of Stay Meetings, dates discussed:    Comments:  03/07/14 12:30 CM met with pt and pt's spouse, Ron , room to offer choice for HHPT/OT.  Family chooses [facility] to render HHPT/OT services. Pt has wheelchair, walker and elevated commode at home and doe NOT need additional DME.  Address and contact information verified with Ron.  Referral called to [facility] rep, Kristen.  IM given to pt and spouse with original signed and placed in shadow chart.  No other CM needs were communicated.  Mariane Masters, BSN, CM [phone].

## 2014-03-07 NOTE — Progress Notes (Signed)
Physical Therapy Treatment Patient Details Name: [name] MRN: [id number] DOB: 04/23/1941 Today's Date: 03/07/2014    History of Present Illness Patient is a 73 y/o female with h/o bladder CA with urostomoy and colon CA  and dementia.  She was admitted with pyelonephritis.    PT Comments    *Pt tolerated increased gait distance of 200' with RW. L ankle edema and 5/10 pain with weight bearing noted, RN notified. Pt and spouse report this is new.**  Follow Up Recommendations  Supervision/Assistance - 24 hour;Home health PT (spouse will assist at home)     Equipment Recommendations  None recommended by PT (has a 4 [employer])    Recommendations for Other Services       Precautions / Restrictions Precautions Precautions: Fall Restrictions Weight Bearing Restrictions: No    Mobility  Bed Mobility Overal bed mobility: Needs Assistance Bed Mobility: Supine to Sit     Supine to sit: HOB elevated;Modified independent (Device/Increase time)     General bed mobility comments: HOB 40*  Transfers Overall transfer level: Needs assistance Equipment used: Rolling walker (2 wheeled)   Sit to Stand: Supervision         General transfer comment: cues for hand placement  Ambulation/Gait Ambulation/Gait assistance: Supervision Ambulation Distance (Feet): 200 Feet Assistive device: Rolling walker (2 wheeled) Gait Pattern/deviations: WFL(Within Functional Limits) Gait velocity: decreased   General Gait Details: c/o pain left ankle with weight bearing, edema noted L ankle, RN notified, no LOB   Stairs            Wheelchair Mobility    Modified Rankin (Stroke Patients Only)       Balance                                    Cognition Arousal/Alertness: Awake/alert Behavior During Therapy: WFL for tasks assessed/performed Overall Cognitive Status: History of cognitive impairments - at baseline                      Exercises       General Comments        Pertinent Vitals/Pain **5/10 L ankle with walking RN notified*    Home Living                      Prior Function            PT Goals (current goals can now be found in the care plan section) Acute Rehab PT Goals Patient Stated Goal: To go home PT Goal Formulation: With patient/family Time For Goal Achievement: 03/18/14 Potential to Achieve Goals: Good Progress towards PT goals: Progressing toward goals    Frequency  Min 3X/week    PT Plan Current plan remains appropriate    Co-evaluation             End of Session Equipment Utilized During Treatment: Gait belt Activity Tolerance: Patient tolerated treatment well Patient left: in chair;with call bell/phone within reach;with family/visitor present     Time: 0086-7619 PT Time Calculation (min): 18 min  Charges:  $Gait Training: 8-22 mins                    G Codes:      Philomena Doheny 03/07/2014, 11:49 AM [phone]

## 2014-03-07 NOTE — Progress Notes (Signed)
Mrs. Engebretson is looking better. Her color looks better. Her platelet count is coming up. She is eating better. She sees me more alert. She still has the dementia but yet is much more talkative and to me, is back to her baseline mental status. She did do some physical therapy. She seemed to do fairly well with this. She does need some help.  She is not hurting.  Her physical exam looks okay. Vital signs are 98.4 with a temperature. Blood pressure 141/62. Pulse is 70. Her lungs are clear. Cardiac exam regular in rhythm. Abdomen is soft. There is no tenderness. No obvious liver or spleen tip is palpable. Extremities shows there is a trace edema in her legs.  Her labs looked okay. Platelet count 36,000. Hemoglobin 10.7. Potassium 4.2.  From my point of view, she is able to go home. Her platelet count is trending upward. Even though she has the anemia, she is a symptomatic with this.  She probably needs to follow up with her primary doctor in 2 weeks or so. I can see her back myself in about a month.  I very much appreciate the opportunity care she has gotten from the hospitalist at all they staff in the ICU and on 3 W.!!!  Lum Keas  Philippians 4:13

## 2014-03-08 DIAGNOSIS — D649 Anemia, unspecified: Secondary | ICD-10-CM | POA: Diagnosis not present

## 2014-03-08 DIAGNOSIS — Z936 Other artificial openings of urinary tract status: Secondary | ICD-10-CM | POA: Diagnosis not present

## 2014-03-08 DIAGNOSIS — IMO0001 Reserved for inherently not codable concepts without codable children: Secondary | ICD-10-CM | POA: Diagnosis not present

## 2014-03-08 DIAGNOSIS — K7689 Other specified diseases of liver: Secondary | ICD-10-CM | POA: Diagnosis not present

## 2014-03-08 DIAGNOSIS — M159 Polyosteoarthritis, unspecified: Secondary | ICD-10-CM | POA: Diagnosis not present

## 2014-03-08 DIAGNOSIS — K7460 Unspecified cirrhosis of liver: Secondary | ICD-10-CM | POA: Diagnosis not present

## 2014-03-08 DIAGNOSIS — Z87440 Personal history of urinary (tract) infections: Secondary | ICD-10-CM | POA: Diagnosis not present

## 2014-03-08 DIAGNOSIS — I10 Essential (primary) hypertension: Secondary | ICD-10-CM | POA: Diagnosis not present

## 2014-03-08 DIAGNOSIS — F0390 Unspecified dementia without behavioral disturbance: Secondary | ICD-10-CM | POA: Diagnosis not present

## 2014-03-10 DIAGNOSIS — K7460 Unspecified cirrhosis of liver: Secondary | ICD-10-CM | POA: Diagnosis not present

## 2014-03-10 DIAGNOSIS — D649 Anemia, unspecified: Secondary | ICD-10-CM | POA: Diagnosis not present

## 2014-03-10 DIAGNOSIS — F0390 Unspecified dementia without behavioral disturbance: Secondary | ICD-10-CM | POA: Diagnosis not present

## 2014-03-10 DIAGNOSIS — M159 Polyosteoarthritis, unspecified: Secondary | ICD-10-CM | POA: Diagnosis not present

## 2014-03-10 DIAGNOSIS — K7689 Other specified diseases of liver: Secondary | ICD-10-CM | POA: Diagnosis not present

## 2014-03-10 DIAGNOSIS — IMO0001 Reserved for inherently not codable concepts without codable children: Secondary | ICD-10-CM | POA: Diagnosis not present

## 2014-03-13 DIAGNOSIS — N390 Urinary tract infection, site not specified: Secondary | ICD-10-CM | POA: Diagnosis not present

## 2014-03-13 DIAGNOSIS — B3731 Acute candidiasis of vulva and vagina: Secondary | ICD-10-CM | POA: Diagnosis not present

## 2014-03-13 DIAGNOSIS — B373 Candidiasis of vulva and vagina: Secondary | ICD-10-CM | POA: Diagnosis not present

## 2014-03-14 DIAGNOSIS — K7460 Unspecified cirrhosis of liver: Secondary | ICD-10-CM | POA: Diagnosis not present

## 2014-03-14 DIAGNOSIS — K7689 Other specified diseases of liver: Secondary | ICD-10-CM | POA: Diagnosis not present

## 2014-03-14 DIAGNOSIS — M159 Polyosteoarthritis, unspecified: Secondary | ICD-10-CM | POA: Diagnosis not present

## 2014-03-14 DIAGNOSIS — D649 Anemia, unspecified: Secondary | ICD-10-CM | POA: Diagnosis not present

## 2014-03-14 DIAGNOSIS — F0390 Unspecified dementia without behavioral disturbance: Secondary | ICD-10-CM | POA: Diagnosis not present

## 2014-03-14 DIAGNOSIS — IMO0001 Reserved for inherently not codable concepts without codable children: Secondary | ICD-10-CM | POA: Diagnosis not present

## 2014-03-15 DIAGNOSIS — M159 Polyosteoarthritis, unspecified: Secondary | ICD-10-CM | POA: Diagnosis not present

## 2014-03-15 DIAGNOSIS — K7689 Other specified diseases of liver: Secondary | ICD-10-CM | POA: Diagnosis not present

## 2014-03-15 DIAGNOSIS — IMO0001 Reserved for inherently not codable concepts without codable children: Secondary | ICD-10-CM | POA: Diagnosis not present

## 2014-03-15 DIAGNOSIS — D649 Anemia, unspecified: Secondary | ICD-10-CM | POA: Diagnosis not present

## 2014-03-15 DIAGNOSIS — K7460 Unspecified cirrhosis of liver: Secondary | ICD-10-CM | POA: Diagnosis not present

## 2014-03-15 DIAGNOSIS — F0390 Unspecified dementia without behavioral disturbance: Secondary | ICD-10-CM | POA: Diagnosis not present

## 2014-03-16 DIAGNOSIS — M159 Polyosteoarthritis, unspecified: Secondary | ICD-10-CM | POA: Diagnosis not present

## 2014-03-16 DIAGNOSIS — F0390 Unspecified dementia without behavioral disturbance: Secondary | ICD-10-CM | POA: Diagnosis not present

## 2014-03-16 DIAGNOSIS — IMO0001 Reserved for inherently not codable concepts without codable children: Secondary | ICD-10-CM | POA: Diagnosis not present

## 2014-03-16 DIAGNOSIS — K7460 Unspecified cirrhosis of liver: Secondary | ICD-10-CM | POA: Diagnosis not present

## 2014-03-16 DIAGNOSIS — K7689 Other specified diseases of liver: Secondary | ICD-10-CM | POA: Diagnosis not present

## 2014-03-16 DIAGNOSIS — D649 Anemia, unspecified: Secondary | ICD-10-CM | POA: Diagnosis not present

## 2014-03-21 DIAGNOSIS — M159 Polyosteoarthritis, unspecified: Secondary | ICD-10-CM | POA: Diagnosis not present

## 2014-03-21 DIAGNOSIS — D649 Anemia, unspecified: Secondary | ICD-10-CM | POA: Diagnosis not present

## 2014-03-21 DIAGNOSIS — F0390 Unspecified dementia without behavioral disturbance: Secondary | ICD-10-CM | POA: Diagnosis not present

## 2014-03-21 DIAGNOSIS — K7689 Other specified diseases of liver: Secondary | ICD-10-CM | POA: Diagnosis not present

## 2014-03-21 DIAGNOSIS — IMO0001 Reserved for inherently not codable concepts without codable children: Secondary | ICD-10-CM | POA: Diagnosis not present

## 2014-03-21 DIAGNOSIS — K7460 Unspecified cirrhosis of liver: Secondary | ICD-10-CM | POA: Diagnosis not present

## 2014-03-22 DIAGNOSIS — M159 Polyosteoarthritis, unspecified: Secondary | ICD-10-CM | POA: Diagnosis not present

## 2014-03-22 DIAGNOSIS — IMO0001 Reserved for inherently not codable concepts without codable children: Secondary | ICD-10-CM | POA: Diagnosis not present

## 2014-03-22 DIAGNOSIS — K7460 Unspecified cirrhosis of liver: Secondary | ICD-10-CM | POA: Diagnosis not present

## 2014-03-22 DIAGNOSIS — F0390 Unspecified dementia without behavioral disturbance: Secondary | ICD-10-CM | POA: Diagnosis not present

## 2014-03-23 DIAGNOSIS — K7689 Other specified diseases of liver: Secondary | ICD-10-CM | POA: Diagnosis not present

## 2014-03-23 DIAGNOSIS — K7460 Unspecified cirrhosis of liver: Secondary | ICD-10-CM | POA: Diagnosis not present

## 2014-03-23 DIAGNOSIS — D649 Anemia, unspecified: Secondary | ICD-10-CM | POA: Diagnosis not present

## 2014-03-23 DIAGNOSIS — IMO0001 Reserved for inherently not codable concepts without codable children: Secondary | ICD-10-CM | POA: Diagnosis not present

## 2014-03-23 DIAGNOSIS — M159 Polyosteoarthritis, unspecified: Secondary | ICD-10-CM | POA: Diagnosis not present

## 2014-03-23 DIAGNOSIS — F0390 Unspecified dementia without behavioral disturbance: Secondary | ICD-10-CM | POA: Diagnosis not present

## 2014-03-27 DIAGNOSIS — K7460 Unspecified cirrhosis of liver: Secondary | ICD-10-CM | POA: Diagnosis not present

## 2014-03-27 DIAGNOSIS — M159 Polyosteoarthritis, unspecified: Secondary | ICD-10-CM | POA: Diagnosis not present

## 2014-03-27 DIAGNOSIS — K7689 Other specified diseases of liver: Secondary | ICD-10-CM | POA: Diagnosis not present

## 2014-03-27 DIAGNOSIS — D649 Anemia, unspecified: Secondary | ICD-10-CM | POA: Diagnosis not present

## 2014-03-27 DIAGNOSIS — F0390 Unspecified dementia without behavioral disturbance: Secondary | ICD-10-CM | POA: Diagnosis not present

## 2014-03-27 DIAGNOSIS — IMO0001 Reserved for inherently not codable concepts without codable children: Secondary | ICD-10-CM | POA: Diagnosis not present

## 2014-03-29 DIAGNOSIS — D649 Anemia, unspecified: Secondary | ICD-10-CM | POA: Diagnosis not present

## 2014-03-29 DIAGNOSIS — IMO0001 Reserved for inherently not codable concepts without codable children: Secondary | ICD-10-CM | POA: Diagnosis not present

## 2014-03-29 DIAGNOSIS — K7689 Other specified diseases of liver: Secondary | ICD-10-CM | POA: Diagnosis not present

## 2014-03-29 DIAGNOSIS — K7460 Unspecified cirrhosis of liver: Secondary | ICD-10-CM | POA: Diagnosis not present

## 2014-03-29 DIAGNOSIS — M159 Polyosteoarthritis, unspecified: Secondary | ICD-10-CM | POA: Diagnosis not present

## 2014-03-29 DIAGNOSIS — F0390 Unspecified dementia without behavioral disturbance: Secondary | ICD-10-CM | POA: Diagnosis not present

## 2014-04-06 ENCOUNTER — Inpatient Hospital Stay (HOSPITAL_COMMUNITY)
Admission: EM | Admit: 2014-04-06 | Discharge: 2014-04-09 | DRG: 659 | Disposition: A | Discharge status: Home Health | Payer: Medicare Other | Attending: Internal Medicine | Admitting: Internal Medicine

## 2014-04-06 ENCOUNTER — Encounter (HOSPITAL_COMMUNITY): Payer: Self-pay | Admitting: Emergency Medicine

## 2014-04-06 ENCOUNTER — Emergency Department (HOSPITAL_COMMUNITY): Payer: Medicare Other

## 2014-04-06 DIAGNOSIS — K921 Melena: Secondary | ICD-10-CM

## 2014-04-06 DIAGNOSIS — N10 Acute tubulo-interstitial nephritis: Secondary | ICD-10-CM | POA: Diagnosis present

## 2014-04-06 DIAGNOSIS — E871 Hypo-osmolality and hyponatremia: Secondary | ICD-10-CM | POA: Diagnosis present

## 2014-04-06 DIAGNOSIS — F329 Major depressive disorder, single episode, unspecified: Secondary | ICD-10-CM | POA: Diagnosis present

## 2014-04-06 DIAGNOSIS — E876 Hypokalemia: Secondary | ICD-10-CM | POA: Diagnosis not present

## 2014-04-06 DIAGNOSIS — K250 Acute gastric ulcer with hemorrhage: Secondary | ICD-10-CM

## 2014-04-06 DIAGNOSIS — M810 Age-related osteoporosis without current pathological fracture: Secondary | ICD-10-CM | POA: Diagnosis present

## 2014-04-06 DIAGNOSIS — N12 Tubulo-interstitial nephritis, not specified as acute or chronic: Secondary | ICD-10-CM | POA: Diagnosis not present

## 2014-04-06 DIAGNOSIS — Z833 Family history of diabetes mellitus: Secondary | ICD-10-CM

## 2014-04-06 DIAGNOSIS — Z87442 Personal history of urinary calculi: Secondary | ICD-10-CM

## 2014-04-06 DIAGNOSIS — Z860100 Personal history of colon polyps, unspecified: Secondary | ICD-10-CM

## 2014-04-06 DIAGNOSIS — D696 Thrombocytopenia, unspecified: Secondary | ICD-10-CM | POA: Diagnosis present

## 2014-04-06 DIAGNOSIS — Z933 Colostomy status: Secondary | ICD-10-CM | POA: Diagnosis not present

## 2014-04-06 DIAGNOSIS — I10 Essential (primary) hypertension: Secondary | ICD-10-CM | POA: Diagnosis present

## 2014-04-06 DIAGNOSIS — I81 Portal vein thrombosis: Secondary | ICD-10-CM | POA: Diagnosis present

## 2014-04-06 DIAGNOSIS — C679 Malignant neoplasm of bladder, unspecified: Secondary | ICD-10-CM | POA: Diagnosis present

## 2014-04-06 DIAGNOSIS — F3289 Other specified depressive episodes: Secondary | ICD-10-CM | POA: Diagnosis present

## 2014-04-06 DIAGNOSIS — R1032 Left lower quadrant pain: Secondary | ICD-10-CM

## 2014-04-06 DIAGNOSIS — D509 Iron deficiency anemia, unspecified: Secondary | ICD-10-CM

## 2014-04-06 DIAGNOSIS — I8510 Secondary esophageal varices without bleeding: Secondary | ICD-10-CM | POA: Diagnosis present

## 2014-04-06 DIAGNOSIS — D689 Coagulation defect, unspecified: Secondary | ICD-10-CM | POA: Diagnosis present

## 2014-04-06 DIAGNOSIS — N1330 Unspecified hydronephrosis: Secondary | ICD-10-CM

## 2014-04-06 DIAGNOSIS — K439 Ventral hernia without obstruction or gangrene: Secondary | ICD-10-CM

## 2014-04-06 DIAGNOSIS — C189 Malignant neoplasm of colon, unspecified: Secondary | ICD-10-CM

## 2014-04-06 DIAGNOSIS — R161 Splenomegaly, not elsewhere classified: Secondary | ICD-10-CM | POA: Diagnosis present

## 2014-04-06 DIAGNOSIS — F0390 Unspecified dementia without behavioral disturbance: Secondary | ICD-10-CM | POA: Diagnosis present

## 2014-04-06 DIAGNOSIS — A498 Other bacterial infections of unspecified site: Secondary | ICD-10-CM | POA: Diagnosis present

## 2014-04-06 DIAGNOSIS — Z87891 Personal history of nicotine dependence: Secondary | ICD-10-CM | POA: Diagnosis not present

## 2014-04-06 DIAGNOSIS — N200 Calculus of kidney: Secondary | ICD-10-CM | POA: Diagnosis present

## 2014-04-06 DIAGNOSIS — Z936 Other artificial openings of urinary tract status: Secondary | ICD-10-CM

## 2014-04-06 DIAGNOSIS — Z96659 Presence of unspecified artificial knee joint: Secondary | ICD-10-CM

## 2014-04-06 DIAGNOSIS — D6959 Other secondary thrombocytopenia: Secondary | ICD-10-CM | POA: Diagnosis present

## 2014-04-06 DIAGNOSIS — R197 Diarrhea, unspecified: Secondary | ICD-10-CM

## 2014-04-06 DIAGNOSIS — K219 Gastro-esophageal reflux disease without esophagitis: Secondary | ICD-10-CM | POA: Diagnosis present

## 2014-04-06 DIAGNOSIS — I8500 Esophageal varices without bleeding: Secondary | ICD-10-CM

## 2014-04-06 DIAGNOSIS — D62 Acute posthemorrhagic anemia: Secondary | ICD-10-CM

## 2014-04-06 DIAGNOSIS — A419 Sepsis, unspecified organism: Secondary | ICD-10-CM

## 2014-04-06 DIAGNOSIS — Z906 Acquired absence of other parts of urinary tract: Secondary | ICD-10-CM | POA: Diagnosis not present

## 2014-04-06 DIAGNOSIS — Z85038 Personal history of other malignant neoplasm of large intestine: Secondary | ICD-10-CM

## 2014-04-06 DIAGNOSIS — K7460 Unspecified cirrhosis of liver: Secondary | ICD-10-CM | POA: Diagnosis present

## 2014-04-06 DIAGNOSIS — Z8551 Personal history of malignant neoplasm of bladder: Secondary | ICD-10-CM | POA: Diagnosis not present

## 2014-04-06 DIAGNOSIS — D731 Hypersplenism: Secondary | ICD-10-CM | POA: Diagnosis not present

## 2014-04-06 DIAGNOSIS — R6520 Severe sepsis without septic shock: Secondary | ICD-10-CM

## 2014-04-06 DIAGNOSIS — B9620 Unspecified Escherichia coli [E. coli] as the cause of diseases classified elsewhere: Secondary | ICD-10-CM | POA: Diagnosis present

## 2014-04-06 DIAGNOSIS — D72819 Decreased white blood cell count, unspecified: Secondary | ICD-10-CM | POA: Diagnosis not present

## 2014-04-06 DIAGNOSIS — Z79899 Other long term (current) drug therapy: Secondary | ICD-10-CM

## 2014-04-06 DIAGNOSIS — K922 Gastrointestinal hemorrhage, unspecified: Secondary | ICD-10-CM

## 2014-04-06 DIAGNOSIS — F0391 Unspecified dementia with behavioral disturbance: Secondary | ICD-10-CM | POA: Diagnosis present

## 2014-04-06 LAB — CBC WITH DIFFERENTIAL/PLATELET
Basophils Absolute: 0 10*3/uL (ref 0.0–0.1)
Basophils Relative: 0 % (ref 0–1)
Eosinophils Absolute: 0 10*3/uL (ref 0.0–0.7)
Eosinophils Relative: 0 % (ref 0–5)
HCT: 38.7 % (ref 36.0–46.0)
Hemoglobin: 13.3 g/dL (ref 12.0–15.0)
Lymphocytes Relative: 4 % — ABNORMAL LOW (ref 12–46)
Lymphs Abs: 0.4 10*3/uL — ABNORMAL LOW (ref 0.7–4.0)
MCH: 31.7 pg (ref 26.0–34.0)
MCHC: 34.4 g/dL (ref 30.0–36.0)
MCV: 92.4 fL (ref 78.0–100.0)
Monocytes Absolute: 0.8 10*3/uL (ref 0.1–1.0)
Monocytes Relative: 9 % (ref 3–12)
Neutro Abs: 7.9 10*3/uL — ABNORMAL HIGH (ref 1.7–7.7)
Neutrophils Relative %: 87 % — ABNORMAL HIGH (ref 43–77)
Platelets: 51 10*3/uL — ABNORMAL LOW (ref 150–400)
RBC: 4.19 MIL/uL (ref 3.87–5.11)
RDW: 16.1 % — ABNORMAL HIGH (ref 11.5–15.5)
WBC: 9.1 10*3/uL (ref 4.0–10.5)

## 2014-04-06 LAB — I-STAT CG4 LACTIC ACID, ED: Lactic Acid, Venous: 2.46 mmol/L — ABNORMAL HIGH (ref 0.5–2.2)

## 2014-04-06 LAB — COMPREHENSIVE METABOLIC PANEL
ALT: 24 U/L (ref 0–35)
AST: 62 U/L — ABNORMAL HIGH (ref 0–37)
Albumin: 3 g/dL — ABNORMAL LOW (ref 3.5–5.2)
Alkaline Phosphatase: 91 U/L (ref 39–117)
Anion gap: 15 (ref 5–15)
BUN: 9 mg/dL (ref 6–23)
CO2: 24 mEq/L (ref 19–32)
Calcium: 9.2 mg/dL (ref 8.4–10.5)
Chloride: 98 mEq/L (ref 96–112)
Creatinine, Ser: 0.67 mg/dL (ref 0.50–1.10)
GFR calc Af Amer: >90 mL/min (ref >90)
GFR calc non Af Amer: 84 mL/min — ABNORMAL LOW (ref >90)
Glucose, Bld: 149 mg/dL — ABNORMAL HIGH (ref 70–99)
Potassium: 3.7 mEq/L (ref 3.7–5.3)
Sodium: 137 mEq/L (ref 137–147)
Total Bilirubin: 4.4 mg/dL — ABNORMAL HIGH (ref 0.3–1.2)
Total Protein: 6.4 g/dL (ref 6.0–8.3)

## 2014-04-06 LAB — URINALYSIS, ROUTINE W REFLEX MICROSCOPIC
Bilirubin Urine: NEGATIVE
GLUCOSE, UA: NEGATIVE mg/dL
KETONES UR: NEGATIVE mg/dL
Nitrite: NEGATIVE
PH: 7 (ref 5.0–8.0)
Protein, ur: 100 mg/dL — AB
Specific Gravity, Urine: 1.01 (ref 1.005–1.030)
Urobilinogen, UA: 1 mg/dL (ref 0.0–1.0)

## 2014-04-06 LAB — URINE MICROSCOPIC-ADD ON

## 2014-04-06 MED ORDER — IOHEXOL 300 MG/ML  SOLN
100.0000 mL | Freq: Once | INTRAMUSCULAR | Status: AC | PRN
  Administered 2014-04-06: 100 mL via INTRAVENOUS

## 2014-04-06 MED ORDER — ACETAMINOPHEN 500 MG PO TABS
1000.0000 mg | ORAL_TABLET | Freq: Once | ORAL | Status: AC
  Administered 2014-04-06: 1000 mg via ORAL
  Filled 2014-04-06: qty 2

## 2014-04-06 MED ORDER — CIPROFLOXACIN IN D5W 400 MG/200ML IV SOLN
400.0000 mg | Freq: Once | INTRAVENOUS | Status: AC
  Administered 2014-04-06: 400 mg via INTRAVENOUS
  Filled 2014-04-06: qty 200

## 2014-04-06 MED ORDER — SODIUM CHLORIDE 0.9 % IV BOLUS (SEPSIS)
1000.0000 mL | Freq: Once | INTRAVENOUS | Status: AC
  Administered 2014-04-06: 1000 mL via INTRAVENOUS

## 2014-04-06 NOTE — ED Notes (Signed)
Pt reports LLQ pain and L flank pain.  Pt is confused at times per norm.  Pt's husband reports pt recently was just seen for same a few days ago, started to c/o of same today.

## 2014-04-06 NOTE — ED Notes (Signed)
Bed: WA03 Expected date:  Expected time:  Means of arrival:  Comments: No bed

## 2014-04-06 NOTE — Consult Note (Signed)
Urology Consult   Physician requesting consult: Mingo Amber (ER)  Reason for consult: left renal stone, low grade temps.  History of Present Illness: [name] is a 73 y.o. with a distant history of cystectomy/ileal conduit in 2003, as well as nephrolithiasis who has been followed by Dr. Jeffie Pollock for these problems. She was recently admitted in June 2015 with e coli bactermia and pyelonephritis with non-obstructive mild hydronephrosis and intrarenal stones.   Today, she and her family (she has dementia) complained of left flank pain, low grade temps to 100.5 and acute on chronic confusion. She had nausea and small volume emesis. She was taken to the ED where a CT scan showed a 1.1cm stone now in the renal pelvis with stable mild left hydronephrosis. She was AFVSS. Her WBC was 9. Her conduit urine was nitrite negative, with LE+ and WBC+.  ROS is positive for the above. Otherwise negative x 12 systems.  Past Medical History  Diagnosis Date  . Cirrhosis 08/26/2011  . Colon cancer 2001  . Bladder cancer 2003  . Memory loss     d/t chemotherapy;short and long term;takes Aricept daily  . Thrombocytopenia   . Osteoporosis     takes Vit D3 daily  . Blood transfusion   . Arthritis   . Complication of anesthesia     confusion x 2 to 3 days  . Hypertension     takes Ramipril and Metoprolol daily  . History of blood clots 50+yrs ago    legs when she was pregnancy  . Headache(784.0)     occasionally  . Joint pain   . Joint swelling   . GERD (gastroesophageal reflux disease)     takes Protonix daily  . History of GI bleed   . Constipation   . Hemorrhoids   . History of colon polyps   . Anemia, iron deficiency     iron injection about 6wks ago  . Cataract immature   . Depression     takes Celexa daily  . Attention to urostomy     urostomy d/t hx bladder cancer  . Blood dyscrasia     thromboctopenia    Past Surgical History  Procedure Laterality Date  . Colon surgery  2004  .  Total knee arthroplasty  2010    Left knee  . Colon surgery  2001  . Colostomy    . Bladder surgery      bladder removed d/t cancer  . Esophagogastroduodenoscopy  02/27/2012    Procedure: ESOPHAGOGASTRODUODENOSCOPY (EGD);  Surgeon: [name], MD;  Location: [name];  Service: Endoscopy;  Laterality: N/A;  bedside case  . Appendectomy    . Abdominal hysterectomy    . Cholecystectomy    . Colonoscopy       Current Hospital Medications:  Home meds:    Medication List    ASK your doctor about these medications       acetaminophen 500 MG tablet  Commonly known as:  TYLENOL  Take 1,000 mg by mouth every 6 (six) hours as needed for mild pain.     Cinnamon 500 MG capsule  Take 500 mg by mouth daily.     citalopram 40 MG tablet  Commonly known as:  CELEXA  Take 20 mg by mouth daily.     fexofenadine 180 MG tablet  Commonly known as:  ALLEGRA  Take 180 mg by mouth daily.     fish oil-omega-3 fatty acids 1000 MG capsule  Take 2 g by mouth  daily.     MEMORY VITE Tabs  Take 1 tablet by mouth daily.     montelukast 10 MG tablet  Commonly known as:  SINGULAIR  Take 10 mg by mouth at bedtime.     MULTI VITAMIN DAILY PO  Take 1 tablet by mouth every morning.     pantoprazole 40 MG tablet  Commonly known as:  PROTONIX  Take 1 tablet (40 mg total) by mouth 2 (two) times daily before a meal.     ramipril 5 MG capsule  Commonly known as:  ALTACE  Take 5 mg by mouth Daily.     traMADol 50 MG tablet  Commonly known as:  ULTRAM  Take 1 tablet (50 mg total) by mouth every 6 (six) hours as needed for moderate pain.     VITAMIN B 12 PO  Take 1 tablet by mouth every morning.     Vitamin D3 1000 UNITS Caps  Take 1 capsule by mouth daily.        Scheduled Meds: Continuous Infusions: PRN Meds:.    Allergies:  Allergies  Allergen Reactions  . Codeine Swelling and Rash    Swelling in throat  . Morphine And Related Swelling and Rash  . Other Anaphylaxis    Bee  Stings  . Meat Extract     Patient doesn't eat meat   . Promethazine Hcl     Per MD chart - 08/08/11  . Penicillins Rash    Family History  Problem Relation Age of Onset  . Diabetes Brother   . Cancer Other   . Hypertension Other     Social History:  reports that she quit smoking about 22 years ago. Her smoking use included Cigarettes. She started smoking about 58 years ago. She has a 17.5 pack-year smoking history. She has never used smokeless tobacco. She reports that she does not drink alcohol or use illicit drugs.  ROS: A complete review of systems was performed.  All systems are negative except for pertinent findings as noted.  Physical Exam:  Vital signs in last 24 hours: Temp:  [98.7 F (37.1 C)-100.1 F (37.8 C)] 100.1 F (37.8 C) (07/16 2213) Pulse Rate:  [74-82] 76 (07/16 2100) Resp:  [18] 18 (07/16 1717) BP: (106-128)/(39-84) 124/39 mmHg (07/16 2100) SpO2:  [94 %-97 %] 94 % (07/16 2100) Constitutional:  Alert and oriented, No acute distress Cardiovascular: Regular rate and rhythm, No JVD Respiratory: Normal respiratory effort, Lungs clear bilaterally GI: Abdomen is soft, nontender, nondistended, no abdominal masses GU: No CVA tenderness Lymphatic: No lymphadenopathy Neurologic: Grossly intact, no focal deficits Psychiatric: Normal mood and affect  Laboratory Data:   Recent Labs  04/06/14 1743  WBC 9.1  HGB 13.3  HCT 38.7  PLT 51*     Recent Labs  04/06/14 1743  NA 137  K 3.7  CL 98  GLUCOSE 149*  BUN 9  CALCIUM 9.2  CREATININE 0.67     Results for orders placed during the hospital encounter of 04/06/14 (from the past 24 hour(s))  CBC WITH DIFFERENTIAL     Status: Abnormal   Collection Time    04/06/14  5:43 PM      Result Value Ref Range   WBC 9.1  4.0 - 10.5 K/uL   RBC 4.19  3.87 - 5.11 MIL/uL   Hemoglobin 13.3  12.0 - 15.0 g/dL   HCT 38.7  36.0 - 46.0 %   MCV 92.4  78.0 - 100.0 fL   MCH 31.7  26.0 - 34.0 pg   MCHC 34.4  30.0 -  36.0 g/dL   RDW 16.1 (*) 11.5 - 15.5 %   Platelets 51 (*) 150 - 400 K/uL   Neutrophils Relative % 87 (*) 43 - 77 %   Neutro Abs 7.9 (*) 1.7 - 7.7 K/uL   Lymphocytes Relative 4 (*) 12 - 46 %   Lymphs Abs 0.4 (*) 0.7 - 4.0 K/uL   Monocytes Relative 9  3 - 12 %   Monocytes Absolute 0.8  0.1 - 1.0 K/uL   Eosinophils Relative 0  0 - 5 %   Eosinophils Absolute 0.0  0.0 - 0.7 K/uL   Basophils Relative 0  0 - 1 %   Basophils Absolute 0.0  0.0 - 0.1 K/uL  COMPREHENSIVE METABOLIC PANEL     Status: Abnormal   Collection Time    04/06/14  5:43 PM      Result Value Ref Range   Sodium 137  137 - 147 mEq/L   Potassium 3.7  3.7 - 5.3 mEq/L   Chloride 98  96 - 112 mEq/L   CO2 24  19 - 32 mEq/L   Glucose, Bld 149 (*) 70 - 99 mg/dL   BUN 9  6 - 23 mg/dL   Creatinine, Ser 0.67  0.50 - 1.10 mg/dL   Calcium 9.2  8.4 - 10.5 mg/dL   Total Protein 6.4  6.0 - 8.3 g/dL   Albumin 3.0 (*) 3.5 - 5.2 g/dL   AST 62 (*) 0 - 37 U/L   ALT 24  0 - 35 U/L   Alkaline Phosphatase 91  39 - 117 U/L   Total Bilirubin 4.4 (*) 0.3 - 1.2 mg/dL   GFR calc non Af Amer 84 (*) >90 mL/min   GFR calc Af Amer >90  >90 mL/min   Anion gap 15  5 - 15  URINALYSIS, ROUTINE W REFLEX MICROSCOPIC     Status: Abnormal   Collection Time    04/06/14  6:56 PM      Result Value Ref Range   Color, Urine AMBER (*) YELLOW   APPearance TURBID (*) CLEAR   Specific Gravity, Urine 1.010  1.005 - 1.030   pH 7.0  5.0 - 8.0   Glucose, UA NEGATIVE  NEGATIVE mg/dL   Hgb urine dipstick LARGE (*) NEGATIVE   Bilirubin Urine NEGATIVE  NEGATIVE   Ketones, ur NEGATIVE  NEGATIVE mg/dL   Protein, ur 100 (*) NEGATIVE mg/dL   Urobilinogen, UA 1.0  0.0 - 1.0 mg/dL   Nitrite NEGATIVE  NEGATIVE   Leukocytes, UA LARGE (*) NEGATIVE  URINE MICROSCOPIC-ADD ON     Status: Abnormal   Collection Time    04/06/14  6:56 PM      Result Value Ref Range   Squamous Epithelial / LPF RARE  RARE   WBC, UA TOO NUMEROUS TO COUNT  <3 WBC/hpf   RBC / HPF 21-50  <3  RBC/hpf   Bacteria, UA MANY (*) RARE  I-STAT CG4 LACTIC ACID, ED     Status: Abnormal   Collection Time    04/06/14  7:52 PM      Result Value Ref Range   Lactic Acid, Venous 2.46 (*) 0.5 - 2.2 mmol/L   No results found for this or any previous visit (from the past 240 hour(s)).  Renal Function:  Recent Labs  04/06/14 1743  CREATININE 0.67   The CrCl is unknown because both a height and weight (above  a minimum accepted value) are required for this calculation.  Radiologic Imaging: Ct Abdomen Pelvis W Contrast  04/06/2014   CLINICAL DATA:  Recurrent mid abdominal pain.  EXAM: CT ABDOMEN AND PELVIS WITH CONTRAST  TECHNIQUE: Multidetector CT imaging of the abdomen and pelvis was performed using the standard protocol following bolus administration of intravenous contrast.  CONTRAST:  121m OMNIPAQUE IOHEXOL 300 MG/ML  SOLN  COMPARISON:  CT abdomen pelvis - 02/28/2014; 01/16/2012  FINDINGS: The hepatic contour remains nodular. Ill-defined hypo attenuating lesions within the medial aspect of the right lobe of the liver are grossly unchanged with index dominant hypoattenuating lesion measuring approximately 5.1 x 3.1 cm (image 19, series 2 common at dominant partially exophytic hypoattenuating lesion measuring approximately 2.8 x 2.3 cm (coronal image 60, series 4 an additional hypo attenuating lesion within the caudal aspect of the right lobe of liver measuring approximately 4.6 x 2.6 cm (image 28, series 2). There is grossly unchanged nonocclusive thrombus within the main portal vein representative coronal image 65, series 4). The right portal vein remains diminutive. Post cholecystectomy. There is a trace amount of fluid about the right lobe of the liver. The spleen remains enlarged, measuring 18.5 cm in length. Several mildly hypertrophied gastroesophageal varices are again noted.  There has been interval displacement of previously noted approximately 1.0 x 0.9 cm nonobstructing stone into the left  renal pelvis (representative image 39, series 2; coronal image 62). There is an unchanged punctate (approximately 0.8 cm) nonobstructing stone within the inferior pole of the left kidney. The amount of left-sided pelvicaliectasis and adjacent perinephric stranding is grossly unchanged. There is symmetric enhancement of the bilateral kidneys, though there is slightly delayed excretion from the left kidney.  Unchanged approximately 6.1 x 5.3 cm hypo attenuating (-2 Hounsfield unit) renal cyst which is noted to contain a thin peripheral septation (image 33, series 3). Additional subcentimeter (approximately 1.1 cm hypo attenuating (approximately 7 Hounsfield unit) renal cyst from the superior pole left kidney. 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 No discrete right-sided renal lesions. Post right lower quadrant urostomy creation.  Ingested enteric contrast extends to the level of the transverse colon. Postsurgical changes of the sigmoid colon without evidence of enteric obstruction. A portion of the transverse colon is contain within a wide neck (approximately 8.2 cm) incisional hernia, not resulting in enteric obstruction. No pneumoperitoneum, pneumatosis or portal venous gas.  There is grossly unchanged nonspecific wall thickening involving a loop of mid small bowel (representative images 37, 43 and 51, series 2; coronal image 70, series 4), not resulting in enteric obstruction. No definable/drainable fluid collections. No pneumoperitoneum, pneumatosis or portal venous gas.  Scattered atherosclerotic plaque within a normal caliber abdominal aorta. The major branch vessels of the abdominal aorta appear patent on this non CTA examination. No retroperitoneal, mesenteric, pelvic or inguinal lymphadenopathy on this noncontrast examination.   Limited visualization of lower thorax demonstrates minimal subsegmental atelectasis within the imaged portions of the bilateral lower lobes as well as the inferior segment of the lingula. No discrete focal airspace opacities. No pleural effusion.  Normal heart size.  No pericardial effusion.  No acute or aggressive osseous abnormalities.  IMPRESSION: 1. Grossly unchanged nonspecific wall thickening involving a short segment of a loop of mid small bowel, not resulting in enteric obstruction, the etiology of which is not depicted  on this examination with differential considerations again including infectious, inflammatory and ischemic etiologies. 2. Interval displacement of approximately 1.1 cm renal stone into the left renal pelvis - this finding is of uncertain clinical significant as there is no associated significant change in previously noted mild left-sided pelvicaliectasis and uroepithelial enhancement. 3. Grossly unchanged findings of cirrhosis and portal venous hypertension with nonocclusive thrombus within the main portal vein and stigmata of portal venous hypertension including splenomegaly and gastroesophageal varices, however there is now with a trace amount of ascites adjacent to the right lobe of the liver.  4. Ill-defined hypo attenuating lesions within the medial and inferior aspects of the anterior segment of the right lobe of the liver which in the setting of cirrhosis are worrisome for hepatocellular carcinoma, though given history of prior malignancy could represent metastatic disease. 5. Extensive postsurgical change of the abdomen as above.   Electronically Signed   By: Sandi Mariscal M.D.   On: 04/06/2014 21:39    I independently reviewed the above imaging studies.  Impression/Recommendation: 23yF with multiple medical problems including cirrhosis, thrombocytopenia, dementia, history of urosepsis and bladder cancer s/p cystectomy/ileal conduit who presents with left flank pain, low grade  temps, no signs of sepsis, and 1.1cm left UPJ stone with stable mild left hydronephrosis. She also has thrombocytopenia, and a history of elevated INR, likely from her liver disease. She has no leukocytosis or AKI today and she is AFVSS.  In this setting, I would recommend medical management for now with broad spectrum antibiotics until cultures return. If she shows signs of sepsis or has worsening pain uncontrollable with medication, she may require a urgent percutaneous nephrostomy tube placed by interventional radiology. Retrograde stent placement is extraordinarily difficult and often unsuccessful in the setting of ileal conduit. Of course, even this would be higher risk for bleeding in this patient given her above comorbidities, and every attempt should be made to manage her medically, as is being done.  But, in the intermediate term, she will need a percutaneous nephrostomy tube to facilitate eventual percutaneous nephrostolithotomy to remove the stone. We should touch base with our interventional radiology colleagues to determine what platelet and INR levels they are comfortable with to do this.  This pt was discussed with Drs. Karsten Ro and [employer].  We will continue to follow this patient.  Margo Aye 04/06/2014, 10:56 PM  [id number]  04/06/14 - Pt. Was seen and examined, chart, labs and images were reviewed and I agree with the above impression and recs.

## 2014-04-06 NOTE — ED Notes (Signed)
Mingo Amber, EDP made aware of CG4 Lactic results.

## 2014-04-06 NOTE — H&P (Signed)
PCP:   Florina Ou, MD   Chief Complaint:  Left flank pain, llq pain  HPI: 73 yo female h/o bladder cancer 8 years ago s/p urostomy/resection, colon cancer several years ago, NASH comes in with ams and llq and left flank pain today.  She had sepsis/uti/kidney stone last month improved with abx.  Vomited once today.  subj fevers.  No diarrhea.  No cough.  Family felt she was getting sick again, so brought her to ED.  Found to have stone further down by upj and uti.  Ostomy still draining clear yellow urine.  Review of Systems:  Positive and negative as per HPI otherwise all other systems are negative per husband  Past Medical History: Past Medical History  Diagnosis Date  . Cirrhosis 08/26/2011  . Colon cancer 2001  . Bladder cancer 2003  . Memory loss     d/t chemotherapy;short and long term;takes Aricept daily  . Thrombocytopenia   . Osteoporosis     takes Vit D3 daily  . Blood transfusion   . Arthritis   . Complication of anesthesia     confusion x 2 to 3 days  . Hypertension     takes Ramipril and Metoprolol daily  . History of blood clots 50+yrs ago    legs when she was pregnancy  . Headache(784.0)     occasionally  . Joint pain   . Joint swelling   . GERD (gastroesophageal reflux disease)     takes Protonix daily  . History of GI bleed   . Constipation   . Hemorrhoids   . History of colon polyps   . Anemia, iron deficiency     iron injection about 6wks ago  . Cataract immature   . Depression     takes Celexa daily  . Attention to urostomy     urostomy d/t hx bladder cancer  . Blood dyscrasia     thromboctopenia   Past Surgical History  Procedure Laterality Date  . Colon surgery  2004  . Total knee arthroplasty  2010    Left knee  . Colon surgery  2001  . Colostomy    . Bladder surgery      bladder removed d/t cancer  . Esophagogastroduodenoscopy  02/27/2012    Procedure: ESOPHAGOGASTRODUODENOSCOPY (EGD);  Surgeon: [name], MD;  Location: [name];  Service: Endoscopy;  Laterality: N/A;  bedside case  . Appendectomy    . Abdominal hysterectomy    . Cholecystectomy    . Colonoscopy      Medications: Prior to Admission medications   Medication Sig Start Date End Date Taking? Authorizing Provider  acetaminophen (TYLENOL) 500 MG tablet Take 1,000 mg by mouth every 6 (six) hours as needed for mild pain.   Yes Historical Provider, MD  Cholecalciferol (VITAMIN D3) 1000 UNITS CAPS Take 1 capsule by mouth daily.     Yes Historical Provider, MD  Cinnamon 500 MG capsule Take 500 mg by mouth daily.    Yes Historical Provider, MD  citalopram (CELEXA) 40 MG tablet Take 20 mg by mouth daily.   Yes Historical Provider, MD  Cyanocobalamin (VITAMIN B 12 PO) Take 1 tablet by mouth every morning.    Yes Historical Provider, MD  fexofenadine (ALLEGRA) 180 MG tablet Take 180 mg by mouth daily.   Yes Historical Provider, MD  fish oil-omega-3 fatty acids 1000 MG capsule Take 2 g by mouth daily.     Yes Historical Provider, MD  montelukast (SINGULAIR) 10 MG  tablet Take 10 mg by mouth at bedtime.   Yes Historical Provider, MD  Multiple Vitamin (MULTI VITAMIN DAILY PO) Take 1 tablet by mouth every morning.    Yes Historical Provider, MD  Multiple Vitamins-Minerals (MEMORY VITE) TABS Take 1 tablet by mouth daily.   Yes Historical Provider, MD  pantoprazole (PROTONIX) 40 MG tablet Take 1 tablet (40 mg total) by mouth 2 (two) times daily before a meal. 03/07/14  Yes Theodis Blaze, MD  ramipril (ALTACE) 5 MG capsule Take 5 mg by mouth Daily. 08/19/11  Yes Historical Provider, MD  traMADol (ULTRAM) 50 MG tablet Take 1 tablet (50 mg total) by mouth every 6 (six) hours as needed for moderate pain. 03/07/14  Yes Theodis Blaze, MD    Allergies:   Allergies  Allergen Reactions  . Codeine Swelling and Rash    Swelling in throat  . Morphine And Related Swelling and Rash  . Other Anaphylaxis    Bee Stings  . Meat Extract     Patient doesn't eat meat   .  Promethazine Hcl     Per MD chart - 08/08/11  . Penicillins Rash    Social History:  reports that she quit smoking about 22 years ago. Her smoking use included Cigarettes. She started smoking about 58 years ago. She has a 17.5 pack-year smoking history. She has never used smokeless tobacco. She reports that she does not drink alcohol or use illicit drugs.  Family History: Family History  Problem Relation Age of Onset  . Diabetes Brother   . Cancer Other   . Hypertension Other     Physical Exam: Filed Vitals:   04/06/14 2003 04/06/14 2015 04/06/14 2100 04/06/14 2213  BP: 106/52 128/63 124/39   Pulse: 76 74 76   Temp:    100.1 F (37.8 C)  TempSrc:    Rectal  Resp:      SpO2: 95% 95% 94%    General appearance: alert, cooperative and no distress Head: Normocephalic, without obvious abnormality, atraumatic Eyes: negative Nose: Nares normal. Septum midline. Mucosa normal. No drainage or sinus tenderness. Neck: no JVD and supple, symmetrical, trachea midline Lungs: clear to auscultation bilaterally Heart: regular rate and rhythm, S1, S2 normal, no murmur, click, rub or gallop Abdomen: soft, non-tender; bowel sounds normal; no masses,  no organomegaly Extremities: extremities normal, atraumatic, no cyanosis or edema Pulses: 2+ and symmetric Skin: Skin color, texture, turgor normal. No rashes or lesions Neurologic: Grossly normal  Labs on Admission:   Recent Labs  04/06/14 1743  NA 137  K 3.7  CL 98  CO2 24  GLUCOSE 149*  BUN 9  CREATININE 0.67  CALCIUM 9.2    Recent Labs  04/06/14 1743  AST 62*  ALT 24  ALKPHOS 91  BILITOT 4.4*  PROT 6.4  ALBUMIN 3.0*    Recent Labs  04/06/14 1743  WBC 9.1  NEUTROABS 7.9*  HGB 13.3  HCT 38.7  MCV 92.4  PLT 51*   Radiological Exams on Admission: Ct Abdomen Pelvis W Contrast  04/06/2014   CLINICAL DATA:  Recurrent mid abdominal pain.  EXAM: CT ABDOMEN AND PELVIS WITH CONTRAST  TECHNIQUE: Multidetector CT imaging of  the abdomen and pelvis was performed using the standard protocol following bolus administration of intravenous contrast.  CONTRAST:  116m OMNIPAQUE IOHEXOL 300 MG/ML  SOLN  COMPARISON:  CT abdomen pelvis - 02/28/2014; 01/16/2012  FINDINGS: The hepatic contour remains nodular. Ill-defined hypo attenuating lesions within the medial aspect of  the right lobe of the liver are grossly unchanged with index dominant hypoattenuating lesion measuring approximately 5.1 x 3.1 cm (image 19, series 2 common at dominant partially exophytic hypoattenuating lesion measuring approximately 2.8 x 2.3 cm (coronal image 60, series 4 an additional hypo attenuating lesion within the caudal aspect of the right lobe of liver measuring approximately 4.6 x 2.6 cm (image 28, series 2). There is grossly unchanged nonocclusive thrombus within the main portal vein representative coronal image 65, series 4). The right portal vein remains diminutive. Post cholecystectomy. There is a trace amount of fluid about the right lobe of the liver. The spleen remains enlarged, measuring 18.5 cm in length. Several mildly hypertrophied gastroesophageal varices are again noted.  There has been interval displacement of previously noted approximately 1.0 x 0.9 cm nonobstructing stone into the left renal pelvis (representative image 39, series 2; coronal image 62). There is an unchanged punctate (approximately 0.8 cm) nonobstructing stone within the inferior pole of the left kidney. The amount of left-sided pelvicaliectasis and adjacent perinephric stranding is grossly unchanged. There is symmetric enhancement of the bilateral kidneys, though there is slightly delayed excretion from the left kidney.  Unchanged approximately 6.1 x 5.3 cm hypo attenuating (-2 Hounsfield unit) renal cyst which is noted to contain a thin peripheral septation (image 33, series 3). Additional subcentimeter (approximately 1.1 cm hypo attenuating (approximately 7 Hounsfield unit) renal  cyst from the superior pole left kidney. 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 No discrete right-sided renal lesions. Post right lower quadrant urostomy creation.  Ingested enteric contrast extends to the level of the transverse colon. Postsurgical changes of the sigmoid colon without evidence of enteric obstruction. A portion of the transverse colon is contain within a wide neck (approximately 8.2 cm) incisional hernia, not resulting in enteric obstruction. No pneumoperitoneum, pneumatosis or portal venous gas.  There is grossly unchanged nonspecific wall thickening involving a loop of mid small bowel (representative images 37, 43 and 51, series 2; coronal image 70, series 4), not resulting in enteric obstruction. No definable/drainable fluid collections. No pneumoperitoneum, pneumatosis or portal venous gas.  Scattered atherosclerotic plaque within a normal caliber abdominal aorta. The major branch vessels of the abdominal aorta appear patent on this non CTA examination. No retroperitoneal, mesenteric, pelvic or inguinal lymphadenopathy on this noncontrast examination.  Limited visualization of lower thorax demonstrates minimal subsegmental atelectasis within the imaged portions of the bilateral lower lobes as well as the inferior segment of the lingula. No discrete focal airspace opacities. No pleural effusion.  Normal heart size.  No pericardial effusion.  No acute or aggressive osseous abnormalities.  IMPRESSION: 1. Grossly unchanged nonspecific wall thickening involving a short segment of a loop of mid small bowel, not resulting in enteric obstruction, the etiology of which is not depicted on this examination with differential considerations again including infectious, inflammatory and ischemic etiologies. 2. Interval displacement  of approximately 1.1 cm renal stone into the left renal pelvis - this finding is of uncertain clinical significant as there is no associated significant change in previously noted mild left-sided pelvicaliectasis and uroepithelial enhancement. 3. Grossly unchanged findings of cirrhosis and portal venous hypertension with nonocclusive thrombus within the main portal vein and stigmata of portal venous hypertension including splenomegaly and gastroesophageal varices, however there is now with a trace amount of ascites adjacent to the  right lobe of the liver.  4. Ill-defined hypo attenuating lesions within the medial and inferior aspects of the anterior segment of the right lobe of the liver which in the setting of cirrhosis are worrisome for hepatocellular carcinoma, though given history of prior malignancy could represent metastatic disease. 5. Extensive postsurgical change of the abdomen as above.   Electronically Signed   By: Sandi Mariscal M.D.   On: 04/06/2014 21:39    Assessment/Plan  73 yo female with acute pyelonephritis left with renal stone and urostomy status  Principal Problem:   Pyelonephritis, acute-  Place on cipro.  Urology called by edp and will see in am.  If pt becomes unstable and spikes fever over 101 may need emergent nephrostomy tube placement tonight.  Place on ivf overnight.  Keep npo after midnight and hold anticoagulants (for chronic thrombocytpenia) and in case needs procedure by urology in am.  Active Problems:  Stable unless o/w noted   Bladder cancer history   Thrombocytopenia   Dementia   Cirrhosis, non-alcoholic   UTI (urinary tract infection)  As above   Nephrolithiasis  As above   Presence of urostomy  Admit to tele. Full code.  Labib Cwynar A 04/06/2014, 10:49 PM

## 2014-04-06 NOTE — ED Provider Notes (Signed)
CSN: [id number]     Arrival date & time 04/06/14  1707 History   First MD Initiated Contact with Patient 04/06/14 1810     Chief Complaint  Patient presents with  . Abdominal Pain     (Consider location/radiation/quality/duration/timing/severity/associated sxs/prior Treatment) HPI Comments: Discharged 1 month ago for similar symptoms.  Patient had UTI sepsis causing ARF. Not acting like herself per husband, more confused from baseline. Has some baseline confusion after chemo for bladder cancer.  Patient is a 73 y.o. female presenting with abdominal pain. The history is provided by the patient.  Abdominal Pain Pain location:  L flank and LLQ Pain quality: aching   Pain radiates to:  Does not radiate Pain severity:  Moderate Onset quality:  Sudden Timing:  Constant Associated symptoms: chills   Associated symptoms: no chest pain, no cough, no diarrhea, no fever, no shortness of breath and no vomiting     Past Medical History  Diagnosis Date  . Cirrhosis 08/26/2011  . Colon cancer 2001  . Bladder cancer 2003  . Memory loss     d/t chemotherapy;short and long term;takes Aricept daily  . Thrombocytopenia   . Osteoporosis     takes Vit D3 daily  . Blood transfusion   . Arthritis   . Complication of anesthesia     confusion x 2 to 3 days  . Hypertension     takes Ramipril and Metoprolol daily  . History of blood clots 50+yrs ago    legs when she was pregnancy  . Headache(784.0)     occasionally  . Joint pain   . Joint swelling   . GERD (gastroesophageal reflux disease)     takes Protonix daily  . History of GI bleed   . Constipation   . Hemorrhoids   . History of colon polyps   . Anemia, iron deficiency     iron injection about 6wks ago  . Cataract immature   . Depression     takes Celexa daily  . Attention to urostomy     urostomy d/t hx bladder cancer  . Blood dyscrasia     thromboctopenia   Past Surgical History  Procedure Laterality Date  . Colon surgery   2004  . Total knee arthroplasty  2010    Left knee  . Colon surgery  2001  . Colostomy    . Bladder surgery      bladder removed d/t cancer  . Esophagogastroduodenoscopy  02/27/2012    Procedure: ESOPHAGOGASTRODUODENOSCOPY (EGD);  Surgeon: [name], MD;  Location: [name];  Service: Endoscopy;  Laterality: N/A;  bedside case  . Appendectomy    . Abdominal hysterectomy    . Cholecystectomy    . Colonoscopy     Family History  Problem Relation Age of Onset  . Diabetes Brother   . Cancer Other   . Hypertension Other    History  Substance Use Topics  . Smoking status: Former Smoker -- 0.50 packs/day for 35 years    Types: Cigarettes    Start date: 10/25/1955    Quit date: 09/22/1991  . Smokeless tobacco: Never Used     Comment: quit 23years ago  . Alcohol Use: No   OB History   Grav Para Term Preterm Abortions TAB SAB Ect Mult Living                 Review of Systems  Constitutional: Positive for chills. Negative for fever.  Respiratory: Negative for cough and shortness of  breath.   Cardiovascular: Negative for chest pain and leg swelling.  Gastrointestinal: Positive for abdominal pain. Negative for vomiting and diarrhea.  All other systems reviewed and are negative.     Allergies  Codeine; Morphine and related; Other; Meat extract; Promethazine hcl; and Penicillins  Home Medications   Prior to Admission medications   Medication Sig Start Date End Date Taking? Authorizing Provider  acetaminophen (TYLENOL) 500 MG tablet Take 1,000 mg by mouth every 6 (six) hours as needed for mild pain.   Yes Historical Provider, MD  Cholecalciferol (VITAMIN D3) 1000 UNITS CAPS Take 1 capsule by mouth daily.     Yes Historical Provider, MD  Cinnamon 500 MG capsule Take 500 mg by mouth daily.    Yes Historical Provider, MD  citalopram (CELEXA) 40 MG tablet Take 20 mg by mouth daily.   Yes Historical Provider, MD  Cyanocobalamin (VITAMIN B 12 PO) Take 1 tablet by mouth every  morning.    Yes Historical Provider, MD  fexofenadine (ALLEGRA) 180 MG tablet Take 180 mg by mouth daily.   Yes Historical Provider, MD  fish oil-omega-3 fatty acids 1000 MG capsule Take 2 g by mouth daily.     Yes Historical Provider, MD  montelukast (SINGULAIR) 10 MG tablet Take 10 mg by mouth at bedtime.   Yes Historical Provider, MD  Multiple Vitamin (MULTI VITAMIN DAILY PO) Take 1 tablet by mouth every morning.    Yes Historical Provider, MD  Multiple Vitamins-Minerals (MEMORY VITE) TABS Take 1 tablet by mouth daily.   Yes Historical Provider, MD  pantoprazole (PROTONIX) 40 MG tablet Take 1 tablet (40 mg total) by mouth 2 (two) times daily before a meal. 03/07/14  Yes Theodis Blaze, MD  ramipril (ALTACE) 5 MG capsule Take 5 mg by mouth Daily. 08/19/11  Yes Historical Provider, MD  traMADol (ULTRAM) 50 MG tablet Take 1 tablet (50 mg total) by mouth every 6 (six) hours as needed for moderate pain. 03/07/14  Yes Theodis Blaze, MD   BP 116/84  Pulse 82  Temp(Src) 98.7 F (37.1 C) (Oral)  Resp 18  SpO2 97% Physical Exam  Nursing note and vitals reviewed. Constitutional: She is oriented to person, place, and time. She appears well-developed and well-nourished. No distress.  HENT:  Head: Normocephalic and atraumatic.  Mouth/Throat: Oropharynx is clear and moist.  Eyes: EOM are normal. Pupils are equal, round, and reactive to light.  Neck: Normal range of motion. Neck supple.  Cardiovascular: Normal rate and regular rhythm.  Exam reveals no friction rub.   No murmur heard. Pulmonary/Chest: Effort normal and breath sounds normal. No respiratory distress. She has no wheezes. She has no rales.  Abdominal: Soft. She exhibits no distension. There is tenderness (LLQ, L lower flank). There is no rebound and no guarding.  Urostomy intact  Musculoskeletal: Normal range of motion. She exhibits no edema.  Neurological: She is alert and oriented to person, place, and time. No cranial nerve deficit. She  exhibits normal muscle tone. Coordination normal.  Skin: She is not diaphoretic.    ED Course  Procedures (including critical care time) Labs Review Labs Reviewed  CBC WITH DIFFERENTIAL - Abnormal; Notable for the following:    RDW 16.1 (*)    Platelets 51 (*)    Neutrophils Relative % 87 (*)    Neutro Abs 7.9 (*)    Lymphocytes Relative 4 (*)    Lymphs Abs 0.4 (*)    All other components within normal limits  COMPREHENSIVE METABOLIC PANEL - Abnormal; Notable for the following:    Glucose, Bld 149 (*)    Albumin 3.0 (*)    AST 62 (*)    Total Bilirubin 4.4 (*)    GFR calc non Af Amer 84 (*)    All other components within normal limits  URINE CULTURE  URINALYSIS, ROUTINE W REFLEX MICROSCOPIC  I-STAT CG4 LACTIC ACID, ED    Imaging Review Ct Abdomen Pelvis W Contrast  04/06/2014   CLINICAL DATA:  Recurrent mid abdominal pain.  EXAM: CT ABDOMEN AND PELVIS WITH CONTRAST  TECHNIQUE: Multidetector CT imaging of the abdomen and pelvis was performed using the standard protocol following bolus administration of intravenous contrast.  CONTRAST:  137m OMNIPAQUE IOHEXOL 300 MG/ML  SOLN  COMPARISON:  CT abdomen pelvis - 02/28/2014; 01/16/2012  FINDINGS: The hepatic contour remains nodular. Ill-defined hypo attenuating lesions within the medial aspect of the right lobe of the liver are grossly unchanged with index dominant hypoattenuating lesion measuring approximately 5.1 x 3.1 cm (image 19, series 2 common at dominant partially exophytic hypoattenuating lesion measuring approximately 2.8 x 2.3 cm (coronal image 60, series 4 an additional hypo attenuating lesion within the caudal aspect of the right lobe of liver measuring approximately 4.6 x 2.6 cm (image 28, series 2). There is grossly unchanged nonocclusive thrombus within the main portal vein representative coronal image 65, series 4). The right portal vein remains diminutive. Post cholecystectomy. There is a trace amount of fluid about the  right lobe of the liver. The spleen remains enlarged, measuring 18.5 cm in length. Several mildly hypertrophied gastroesophageal varices are again noted.  There has been interval displacement of previously noted approximately 1.0 x 0.9 cm nonobstructing stone into the left renal pelvis (representative image 39, series 2; coronal image 62). There is an unchanged punctate (approximately 0.8 cm) nonobstructing stone within the inferior pole of the left kidney. The amount of left-sided pelvicaliectasis and adjacent perinephric stranding is grossly unchanged. There is symmetric enhancement of the bilateral kidneys, though there is slightly delayed excretion from the left kidney.  Unchanged approximately 6.1 x 5.3 cm hypo attenuating (-2 Hounsfield unit) renal cyst which is noted to contain a thin peripheral septation (image 33, series 3). Additional subcentimeter (approximately 1.1 cm hypo attenuating (approximately 7 Hounsfield unit) renal cyst from the superior pole left kidney. 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 No discrete right-sided renal lesions. Post right lower quadrant urostomy creation.  Ingested enteric contrast extends to the level of the transverse colon. Postsurgical changes of the sigmoid colon without evidence of enteric obstruction. A portion of the transverse colon is contain within a wide neck (approximately 8.2 cm) incisional hernia, not resulting in enteric obstruction. No pneumoperitoneum, pneumatosis or portal venous gas.  There is grossly unchanged nonspecific wall thickening involving a loop of mid small bowel (representative images 37, 43 and 51, series 2; coronal image 70, series 4), not resulting in enteric obstruction. No definable/drainable fluid collections. No pneumoperitoneum, pneumatosis or portal venous  gas.  Scattered atherosclerotic plaque within a normal caliber abdominal aorta. The major branch vessels of the abdominal aorta appear patent on this non CTA examination. No retroperitoneal, mesenteric, pelvic or inguinal lymphadenopathy on this noncontrast examination.  Limited visualization of lower thorax demonstrates minimal subsegmental atelectasis within the imaged portions of the bilateral lower lobes as  well as the inferior segment of the lingula. No discrete focal airspace opacities. No pleural effusion.  Normal heart size.  No pericardial effusion.  No acute or aggressive osseous abnormalities.  IMPRESSION: 1. Grossly unchanged nonspecific wall thickening involving a short segment of a loop of mid small bowel, not resulting in enteric obstruction, the etiology of which is not depicted on this examination with differential considerations again including infectious, inflammatory and ischemic etiologies. 2. Interval displacement of approximately 1.1 cm renal stone into the left renal pelvis - this finding is of uncertain clinical significant as there is no associated significant change in previously noted mild left-sided pelvicaliectasis and uroepithelial enhancement. 3. Grossly unchanged findings of cirrhosis and portal venous hypertension with nonocclusive thrombus within the main portal vein and stigmata of portal venous hypertension including splenomegaly and gastroesophageal varices, however there is now with a trace amount of ascites adjacent to the right lobe of the liver.  4. Ill-defined hypo attenuating lesions within the medial and inferior aspects of the anterior segment of the right lobe of the liver which in the setting of cirrhosis are worrisome for hepatocellular carcinoma, though given history of prior malignancy could represent metastatic disease. 5. Extensive postsurgical change of the abdomen as above.   Electronically Signed   By: Sandi Mariscal M.D.   On: 04/06/2014 21:39     EKG  Interpretation None      MDM   Final diagnoses:  Pyelonephritis  Left lower quadrant pain    73 year old female with history of bladder cancer with urostomy in place presents with right ureters, altered mental status, belly pain. Her has been acting more confused than baseline. Has baseline confusion from some chemotherapy. Of note she is here for similar symptoms one month ago. Had urinary tract infection causing sepsis with end organ damage of acute renal failure and encephalopathy. Here today with stable vitals. She feels warm. Mild left lower quadrant left flank pain. Urostomy intact with normal-appearing urine. We'll check labs, CT of abdomen. CT shows 1.1 cm stone in L renal pelvis, no signs of obstruction. Labs with normal white count, normal renal function. UA shows UTI. Given Cipro. Admitted by Dr. Shanon Brow. I spoke with Dr. Glo Herring of Urology who states stone has high risk of obstructing at UPJ and if she develops fever > 101.5 F, hypotension, unstable vitals - will need emergent percutaneous nephrostomy since she's had a urostomy.  I have reviewed all labs and imaging and considered them in my medical decision making.   Osvaldo Shipper, MD 04/06/14 2249

## 2014-04-06 NOTE — ED Notes (Signed)
Per pt/family-was recently discharged with same symptoms a few days ago-having same symptoms started again this am

## 2014-04-07 ENCOUNTER — Encounter (HOSPITAL_COMMUNITY): Payer: Self-pay | Admitting: Radiology

## 2014-04-07 DIAGNOSIS — D509 Iron deficiency anemia, unspecified: Secondary | ICD-10-CM

## 2014-04-07 LAB — CBC
HCT: 30.5 % — ABNORMAL LOW (ref 36.0–46.0)
HCT: 29.1 % — ABNORMAL LOW (ref 36.0–46.0)
Hemoglobin: 10.7 g/dL — ABNORMAL LOW (ref 12.0–15.0)
Hemoglobin: 10.1 g/dL — ABNORMAL LOW (ref 12.0–15.0)
MCH: 31.7 pg (ref 26.0–34.0)
MCH: 31.9 pg (ref 26.0–34.0)
MCHC: 35.1 g/dL (ref 30.0–36.0)
MCHC: 34.7 g/dL (ref 30.0–36.0)
MCV: 90.2 fL (ref 78.0–100.0)
MCV: 91.8 fL (ref 78.0–100.0)
Platelets: 31 10*3/uL — ABNORMAL LOW (ref 150–400)
Platelets: 39 10*3/uL — ABNORMAL LOW (ref 150–400)
RBC: 3.38 MIL/uL — AB (ref 3.87–5.11)
RBC: 3.17 MIL/uL — ABNORMAL LOW (ref 3.87–5.11)
RDW: 15.8 % — ABNORMAL HIGH (ref 11.5–15.5)
RDW: 16 % — AB (ref 11.5–15.5)
WBC: 5.3 10*3/uL (ref 4.0–10.5)
WBC: 3.5 10*3/uL — AB (ref 4.0–10.5)

## 2014-04-07 LAB — PROTIME-INR
INR: 1.68 — ABNORMAL HIGH (ref 0.00–1.49)
INR: 1.8 — ABNORMAL HIGH (ref 0.00–1.49)
INR: 1.68 — ABNORMAL HIGH (ref 0.00–1.49)
Prothrombin Time: 19.8 seconds — ABNORMAL HIGH (ref 11.6–15.2)
Prothrombin Time: 20.9 seconds — ABNORMAL HIGH (ref 11.6–15.2)
Prothrombin Time: 19.8 seconds — ABNORMAL HIGH (ref 11.6–15.2)

## 2014-04-07 LAB — BASIC METABOLIC PANEL
Anion gap: 9 (ref 5–15)
BUN: 12 mg/dL (ref 6–23)
CALCIUM: 8.4 mg/dL (ref 8.4–10.5)
CO2: 25 mEq/L (ref 19–32)
Chloride: 102 mEq/L (ref 96–112)
Creatinine, Ser: 0.65 mg/dL (ref 0.50–1.10)
GFR calc Af Amer: >90 mL/min (ref >90)
GFR calc non Af Amer: 85 mL/min — ABNORMAL LOW (ref >90)
GLUCOSE: 126 mg/dL — AB (ref 70–99)
POTASSIUM: 3 meq/L — AB (ref 3.7–5.3)
SODIUM: 136 meq/L — AB (ref 137–147)

## 2014-04-07 LAB — TYPE AND SCREEN
ABO/RH(D): A POS
Antibody Screen: NEGATIVE

## 2014-04-07 LAB — LACTIC ACID, PLASMA: Lactic Acid, Venous: 1.5 mmol/L (ref 0.5–2.2)

## 2014-04-07 MED ORDER — CITALOPRAM HYDROBROMIDE 40 MG PO TABS
40.0000 mg | ORAL_TABLET | Freq: Every day | ORAL | Status: DC
  Filled 2014-04-07: qty 1

## 2014-04-07 MED ORDER — VITAMIN B-12 1000 MCG PO TABS
1000.0000 ug | ORAL_TABLET | Freq: Every day | ORAL | Status: DC
  Administered 2014-04-08 – 2014-04-09 (×2): 1000 ug via ORAL
  Filled 2014-04-07 (×2): qty 1

## 2014-04-07 MED ORDER — PANTOPRAZOLE SODIUM 40 MG PO TBEC
40.0000 mg | DELAYED_RELEASE_TABLET | Freq: Two times a day (BID) | ORAL | Status: DC
  Administered 2014-04-07 – 2014-04-09 (×4): 40 mg via ORAL
  Filled 2014-04-07 (×6): qty 1

## 2014-04-07 MED ORDER — CIPROFLOXACIN IN D5W 400 MG/200ML IV SOLN
400.0000 mg | Freq: Two times a day (BID) | INTRAVENOUS | Status: DC
  Administered 2014-04-07 – 2014-04-09 (×5): 400 mg via INTRAVENOUS
  Filled 2014-04-07 (×6): qty 200

## 2014-04-07 MED ORDER — MONTELUKAST SODIUM 10 MG PO TABS
10.0000 mg | ORAL_TABLET | Freq: Every day | ORAL | Status: DC
  Administered 2014-04-07 – 2014-04-08 (×2): 10 mg via ORAL
  Filled 2014-04-07 (×3): qty 1

## 2014-04-07 MED ORDER — SACCHAROMYCES BOULARDII 250 MG PO CAPS
250.0000 mg | ORAL_CAPSULE | Freq: Two times a day (BID) | ORAL | Status: DC
  Administered 2014-04-07 – 2014-04-09 (×4): 250 mg via ORAL
  Filled 2014-04-07 (×6): qty 1

## 2014-04-07 MED ORDER — POTASSIUM CHLORIDE IN NACL 20-0.9 MEQ/L-% IV SOLN
INTRAVENOUS | Status: AC
  Administered 2014-04-07: 01:00:00 via INTRAVENOUS
  Filled 2014-04-07: qty 1000

## 2014-04-07 MED ORDER — ONDANSETRON HCL 4 MG PO TABS: 4.0000 mg | ORAL_TABLET | Freq: Four times a day (QID) | ORAL | Status: DC | PRN

## 2014-04-07 MED ORDER — VITAMIN K1 10 MG/ML IJ SOLN
10.0000 mg | Freq: Once | INTRAMUSCULAR | Status: AC
  Administered 2014-04-07: 10 mg via INTRAVENOUS
  Filled 2014-04-07: qty 1

## 2014-04-07 MED ORDER — DIPHENHYDRAMINE HCL 50 MG/ML IJ SOLN
12.5000 mg | Freq: Once | INTRAMUSCULAR | Status: AC
  Administered 2014-04-07: 12.5 mg via INTRAVENOUS
  Filled 2014-04-07: qty 1

## 2014-04-07 MED ORDER — DIPHENHYDRAMINE HCL 25 MG PO CAPS: 25.0000 mg | ORAL_CAPSULE | Freq: Once | ORAL | Status: DC

## 2014-04-07 MED ORDER — HYDROMORPHONE HCL PF 1 MG/ML IJ SOLN
1.0000 mg | INTRAMUSCULAR | Status: DC | PRN
  Administered 2014-04-08 – 2014-04-09 (×3): 1 mg via INTRAVENOUS
  Filled 2014-04-07 (×3): qty 1

## 2014-04-07 MED ORDER — ONDANSETRON HCL 4 MG/2ML IJ SOLN: 4.0000 mg | Freq: Four times a day (QID) | INTRAMUSCULAR | Status: DC | PRN

## 2014-04-07 MED ORDER — POTASSIUM CHLORIDE CRYS ER 20 MEQ PO TBCR
40.0000 meq | EXTENDED_RELEASE_TABLET | Freq: Once | ORAL | Status: AC
  Administered 2014-04-07: 40 meq via ORAL
  Filled 2014-04-07: qty 2

## 2014-04-07 NOTE — Progress Notes (Signed)
[facility] PROGRESS NOTE  [name] KYH:[id number] DOB: 07-10-1941 DOA: 04/06/2014 PCP: Florina Ou, MD  Assessment/Plan  Acute pyelonephritis in setting of nonobstructive left kidney stone, UA with TNTC WBC and many bacteria -  Last month's E. Coli was sensitive to ceftriaxone and cipro -  Continue ciprofloxacin -  F/u urine cultures -  Appreciate urology assistance -  NPO for percutaneous nephrostomy tube to facilitate eventual nephrostolithotomy to remove the stone  Cirrhosis with chronic anemia, thrombocytopenia, elevated INR, elevated bili, low albumin, splenomegaly, varices and chronic portal vein thrombosis -  Hgb, plt, INR at baseline -  No evidence of acute bleeding  Chronic iron deficiency anemia, hgb at baseline, continue iron supplementation  Thrombocytopenia due to cirrhosis, IR asking plt be closer to 50K -  Transfuse 1 unit platelets  Cirrhosis-related coagulopathy.  Radiologist asked that her INR of 1.8 be closer to 1.5 prior to procedure.  I am not sure that this is a reasonable expectation as her elevated INR is due to cirrhosis, but will try. -  Vitamin K 54m IV once -  1 unit FFP  HTN, BP stable.  -  Hold ramipril, especially prior to procedure  GERD, stable, continue PPI  Depression, stable, resume celexa  Hx of bladder CA s/p urostomy, stable.    Mild hyponatremia, likely secondary to mild dehydration -  Continue IV fluids for a few more hours -  Resume diet post procedure  Hypokalemia, due to poor oral intake and n.p.o., replete with oral potassium  Diet:  NPO for procedure Access:  PIV IVF:  Yes x 12 hours, then stop Proph:  SCDs   Code Status: full  Family Communication: patient alone Disposition Plan: pending nephrostomy tube placement and results of urine and blood cultures   Consultants:  Urology, Dr. FDamaris Schooner WJeffie Pollock Procedures:  CT abd/pelvis  Antibiotics:  cipro 7/16 >>  HPI/Subjective:  Has HA.  Denies  sore throat, nausea, vomiting since admission, diarrhea, abdominal pain, flank pain  Objective: Filed Vitals:   04/06/14 2353 04/07/14 0004 04/07/14 0240 04/07/14 0530  BP:  144/106  130/50  Pulse: 68   66  Temp:  98.1 F (36.7 C) 98.9 F (37.2 C) 98.3 F (36.8 C)  TempSrc:  Oral Rectal Oral  Resp: 18 19  18   Height:  5' 5"  (1.651 m)    Weight:  101 kg (222 lb 10.6 oz)    SpO2: 97% 97%  97%    Intake/Output Summary (Last 24 hours) at 04/07/14 01517Last data filed at 04/07/14 06160 Gross per 24 hour  Intake 1466.25 ml  Output    400 ml  Net 1066.25 ml   Filed Weights   04/07/14 0004  Weight: 101 kg (222 lb 10.6 oz)    Exam:   General:  Caucasian female, No acute distress  HEENT:  NCAT, MMM  Cardiovascular:  RRR, nl S1, S2 no mrg, 2+ pulses, warm extremities  Respiratory:  CTAB, no increased WOB  Abdomen:   NABS, soft, NT/ND, urostomy in place in the right midabdomen with clear yellow urine and tubing and slightly blood-tinged urine in the bag.  MSK:   Normal tone and bulk, no LEE  Neuro:  Grossly intact  Psych: Alert but not oriented to current events  Data Reviewed: Basic Metabolic Panel:  Recent Labs Lab 04/06/14 1743 04/07/14 0413  NA 137 136*  K 3.7 3.0*  CL 98 102  CO2 24 25  GLUCOSE 149* 126*  BUN 9  12  CREATININE 0.67 0.65  CALCIUM 9.2 8.4   Liver Function Tests:  Recent Labs Lab 04/06/14 1743  AST 62*  ALT 24  ALKPHOS 91  BILITOT 4.4*  PROT 6.4  ALBUMIN 3.0*   No results found for this basename: LIPASE, AMYLASE,  in the last 168 hours No results found for this basename: AMMONIA,  in the last 168 hours CBC:  Recent Labs Lab 04/06/14 1743 04/07/14 0413  WBC 9.1 5.3  NEUTROABS 7.9*  --   HGB 13.3 10.7*  HCT 38.7 30.5*  MCV 92.4 90.2  PLT 51* 31*   Cardiac Enzymes: No results found for this basename: CKTOTAL, CKMB, CKMBINDEX, TROPONINI,  in the last 168 hours BNP (last 3 results) No results found for this basename:  PROBNP,  in the last 8760 hours CBG: No results found for this basename: GLUCAP,  in the last 168 hours  No results found for this or any previous visit (from the past 240 hour(s)).   Studies: Ct Abdomen Pelvis W Contrast  04/06/2014   CLINICAL DATA:  Recurrent mid abdominal pain.  EXAM: CT ABDOMEN AND PELVIS WITH CONTRAST  TECHNIQUE: Multidetector CT imaging of the abdomen and pelvis was performed using the standard protocol following bolus administration of intravenous contrast.  CONTRAST:  139m OMNIPAQUE IOHEXOL 300 MG/ML  SOLN  COMPARISON:  CT abdomen pelvis - 02/28/2014; 01/16/2012  FINDINGS: The hepatic contour remains nodular. Ill-defined hypo attenuating lesions within the medial aspect of the right lobe of the liver are grossly unchanged with index dominant hypoattenuating lesion measuring approximately 5.1 x 3.1 cm (image 19, series 2 common at dominant partially exophytic hypoattenuating lesion measuring approximately 2.8 x 2.3 cm (coronal image 60, series 4 an additional hypo attenuating lesion within the caudal aspect of the right lobe of liver measuring approximately 4.6 x 2.6 cm (image 28, series 2). There is grossly unchanged nonocclusive thrombus within the main portal vein representative coronal image 65, series 4). The right portal vein remains diminutive. Post cholecystectomy. There is a trace amount of fluid about the right lobe of the liver. The spleen remains enlarged, measuring 18.5 cm in length. Several mildly hypertrophied gastroesophageal varices are again noted.  There has been interval displacement of previously noted approximately 1.0 x 0.9 cm nonobstructing stone into the left renal pelvis (representative image 39, series 2; coronal image 62). There is an unchanged punctate (approximately 0.8 cm) nonobstructing stone within the inferior pole of the left kidney. The amount of left-sided pelvicaliectasis and adjacent perinephric stranding is grossly unchanged. There is symmetric  enhancement of the bilateral kidneys, though there is slightly delayed excretion from the left kidney.  Unchanged approximately 6.1 x 5.3 cm hypo attenuating (-2 Hounsfield unit) renal cyst which is noted to contain a thin peripheral septation (image 33, series 3). Additional subcentimeter (approximately 1.1 cm hypo attenuating (approximately 7 Hounsfield unit) renal cyst from the superior pole left kidney. 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 No discrete right-sided renal lesions. Post right lower quadrant urostomy creation.  Ingested enteric contrast extends to the level of the transverse colon. Postsurgical changes of the sigmoid colon without evidence of enteric obstruction. A portion of the transverse colon is contain within a wide neck (approximately 8.2 cm) incisional hernia, not resulting in enteric obstruction. No pneumoperitoneum, pneumatosis or portal venous gas.  There is  grossly unchanged nonspecific wall thickening involving a loop of mid small bowel (representative images 37, 43 and 51, series 2; coronal image 70, series 4), not resulting in enteric obstruction. No definable/drainable fluid collections. No pneumoperitoneum, pneumatosis or portal venous gas.  Scattered atherosclerotic plaque within a normal caliber abdominal aorta. The major branch vessels of the abdominal aorta appear patent on this non CTA examination. No retroperitoneal, mesenteric, pelvic or inguinal lymphadenopathy on this noncontrast examination.  Limited visualization of lower thorax demonstrates minimal subsegmental atelectasis within the imaged portions of the bilateral lower lobes as well as the inferior segment of the lingula. No discrete focal airspace opacities. No pleural effusion.  Normal heart size.  No pericardial effusion.  No acute or  aggressive osseous abnormalities.  IMPRESSION: 1. Grossly unchanged nonspecific wall thickening involving a [name] segment of a loop of mid small bowel, not resulting in enteric obstruction, the etiology of which is not depicted on this examination with differential considerations again including infectious, inflammatory and ischemic etiologies. 2. Interval displacement of approximately 1.1 cm renal stone into the left renal pelvis - this finding is of uncertain clinical significant as there is no associated significant change in previously noted mild left-sided pelvicaliectasis and uroepithelial enhancement. 3. Grossly unchanged findings of cirrhosis and portal venous hypertension with nonocclusive thrombus within the main portal vein and stigmata of portal venous hypertension including splenomegaly and gastroesophageal varices, however there is now with a trace amount of ascites adjacent to the right lobe of the liver.  4. Ill-defined hypo attenuating lesions within the medial and inferior aspects of the anterior segment of the right lobe of the liver which in the setting of cirrhosis are worrisome for hepatocellular carcinoma, though given history of prior malignancy could represent metastatic disease. 5. Extensive postsurgical change of the abdomen as above.   Electronically Signed   By: Sandi Mariscal M.D.   On: 04/06/2014 21:39    Scheduled Meds: . ciprofloxacin  400 mg Intravenous Q12H  . potassium chloride  40 mEq Oral Once   Continuous Infusions: . 0.9 % NaCl with KCl 20 mEq / L 75 mL/hr at 04/07/14 0045    Principal Problem:   Pyelonephritis, acute Active Problems:   Bladder cancer   Thrombocytopenia   Dementia   Cirrhosis, non-alcoholic   UTI (urinary tract infection)   Nephrolithiasis   Presence of urostomy    Time spent: 30 min    [name], [facility] Pager [phone]. If 7PM-7AM, please contact night-coverage at [email or web address], password [facility] 04/07/2014, 8:07 AM   LOS: 1 day

## 2014-04-07 NOTE — H&P (Signed)
Add on emergency out pt this PM.  D/W Dr. Sheran Fava who stated pt is stable to wait until AM and would likely not benefit any further from giving additional products.  As such, case d/w Dr. Kathlene Cote who will perform in AM.    Consent signed and in chart.

## 2014-04-07 NOTE — Significant Event (Signed)
Rapid Response Event Note  Overview:      Initial Focused Assessment: Respiratory   Interventions:   Event Summary: RRT called to 1401 for ? Allergic reaction to Vit K. Dr Sheran Fava already paged by bedside RN. Upon arrival pt supine in bed. A & O. Slightly cyanotic but talkative. Daughter at bedside. Bedside RN and RT reports she received Vit K and immediately c/o severe back pain and turned blue. RT placed NRB on. RRT RN placed pt on monitor. SBP 115-124, DBP 101-106, HR 70's NSR, RR 20's with sats 100%. Chest clear with decreased sound in bases.No c/o back pain at this time. Color has much improved since RRT arrival.  Pt questioning what happened. RRT call ended with pt talkative, no further c/o. Dr Sheran Fava at bedside.   at      at          [place], [place]

## 2014-04-07 NOTE — Progress Notes (Signed)
Rn in patient room , having conversation with patient and family members, IV medication Vit K was given,  When patient suddenly complained of difficulty of breathing , severe back pain and  became slightly cyanotic,  Patient still alert and responsive, no complaints of chest pains and able to communicate  at this time.Rapid response was called,placed on non re breather mask, v/s checked ( see flowsheet) Patient was relieved, seen and evaluated by MD.

## 2014-04-07 NOTE — Progress Notes (Signed)
IR PA aware of request for left percutaneous nephrostomy tube. Dr. Jeffie Pollock has discussed this case with Dr. Pascal Lux, IR recommends INR < 1.5 and plt > 50k prior to proceeding with procedure.   Tsosie Billing PA-C Interventional Radiology  04/07/14  9:52 AM

## 2014-04-07 NOTE — Progress Notes (Signed)
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  NA - LOS <3 / Initial given by admissions (If response is "NO", the following Medicare IM given date fields will be blank) Date Medicare IM given:   Medicare IM given by:   Date Additional Medicare IM given:   Additional Medicare IM given by:    Discharge Disposition:    Per UR Regulation:  Reviewed for med. necessity/level of care/duration of stay  If discussed at [place] of Stay Meetings, dates discussed:    Comments:  [id number] Rosana Hoes, RN, BSN, CCM: CHART REVIEWED AND UPDATED.  Next chart review due on [id number]. NO DISCHARGE NEEDS PRESENT AT THIS TIME WILL CONTINUE TO FOLLOW. CASE MANAGEMENT [phone]

## 2014-04-07 NOTE — Progress Notes (Signed)
I unit platelets and 1 unit FFP given to patient, tolerated transfusion, no adverse reaction noted.

## 2014-04-07 NOTE — H&P (Signed)
Referring Physician: Dr. Jeffie Pollock HPI: [name] is an 73 y.o. female with history of cystectomy/ileal conduit in 2003. She has left nephrolithiasis with fevers at home and concern for pyelonephritis. IR received request for image guided left PCN placement. The patient has dementia and history is obtained partially from her family. She denies any active pain or fevers. She does have a history of cirrhosis with coagulopathy plt 31k today s/p 1 unit platelets given. INR 1.8 today, s/p vitamin K and FFP, repeat INR pending. She denies any active bleeding. She denies any chest pain, shortness of breath or palpitations. She has previously tolerated sedation without complications.   Past Medical History:  Past Medical History  Diagnosis Date  . Cirrhosis 08/26/2011  . Colon cancer 2001  . Bladder cancer 2003  . Memory loss     d/t chemotherapy;short and long term;takes Aricept daily  . Thrombocytopenia   . Osteoporosis     takes Vit D3 daily  . Blood transfusion   . Arthritis   . Complication of anesthesia     confusion x 2 to 3 days  . Hypertension     takes Ramipril and Metoprolol daily  . History of blood clots 50+yrs ago    legs when she was pregnancy  . Headache(784.0)     occasionally  . Joint pain   . Joint swelling   . GERD (gastroesophageal reflux disease)     takes Protonix daily  . History of GI bleed   . Constipation   . Hemorrhoids   . History of colon polyps   . Anemia, iron deficiency     iron injection about 6wks ago  . Cataract immature   . Depression     takes Celexa daily  . Attention to urostomy     urostomy d/t hx bladder cancer  . Blood dyscrasia     thromboctopenia    Past Surgical History:  Past Surgical History  Procedure Laterality Date  . Colon surgery  2004  . Total knee arthroplasty  2010    Left knee  . Colon surgery  2001  . Colostomy    . Bladder surgery      bladder removed d/t cancer  . Esophagogastroduodenoscopy  02/27/2012     Procedure: ESOPHAGOGASTRODUODENOSCOPY (EGD);  Surgeon: [name], MD;  Location: [name];  Service: Endoscopy;  Laterality: N/A;  bedside case  . Appendectomy    . Abdominal hysterectomy    . Cholecystectomy    . Colonoscopy      Family History:  Family History  Problem Relation Age of Onset  . Diabetes Brother   . Cancer Other   . Hypertension Other     Social History:  reports that she quit smoking about 22 years ago. Her smoking use included Cigarettes. She started smoking about 58 years ago. She has a 17.5 pack-year smoking history. She has never used smokeless tobacco. She reports that she does not drink alcohol or use illicit drugs.  Allergies:  Allergies  Allergen Reactions  . Codeine Swelling and Rash    Swelling in throat  . Morphine And Related Swelling and Rash  . Other Anaphylaxis    Bee Stings  . Phytonadione Other (See Comments)    Patient experienced episode of cyanosis following dose of Vitamin K 10 mg IV.  . Meat Extract     Patient doesn't eat meat   . Promethazine Hcl     Per MD chart - 08/08/11  . Penicillins Rash  Medications:   Medication List    ASK your doctor about these medications       acetaminophen 500 MG tablet  Commonly known as:  TYLENOL  Take 1,000 mg by mouth every 6 (six) hours as needed for mild pain.     Cinnamon 500 MG capsule  Take 500 mg by mouth daily.     citalopram 40 MG tablet  Commonly known as:  CELEXA  Take 20 mg by mouth daily.     fexofenadine 180 MG tablet  Commonly known as:  ALLEGRA  Take 180 mg by mouth daily.     fish oil-omega-3 fatty acids 1000 MG capsule  Take 2 g by mouth daily.     MEMORY VITE Tabs  Take 1 tablet by mouth daily.     montelukast 10 MG tablet  Commonly known as:  SINGULAIR  Take 10 mg by mouth at bedtime.     MULTI VITAMIN DAILY PO  Take 1 tablet by mouth every morning.     pantoprazole 40 MG tablet  Commonly known as:  PROTONIX  Take 1 tablet (40 mg total) by  mouth 2 (two) times daily before a meal.     ramipril 5 MG capsule  Commonly known as:  ALTACE  Take 5 mg by mouth Daily.     traMADol 50 MG tablet  Commonly known as:  ULTRAM  Take 1 tablet (50 mg total) by mouth every 6 (six) hours as needed for moderate pain.     VITAMIN B 12 PO  Take 1 tablet by mouth every morning.     Vitamin D3 1000 UNITS Caps  Take 1 capsule by mouth daily.       Please HPI for pertinent positives, otherwise complete 10 system ROS negative.  Physical Exam: BP 131/68  Pulse 68  Temp(Src) 98.7 F (37.1 C) (Oral)  Resp 16  Ht 5' 5"  (1.651 m)  Wt 222 lb 10.6 oz (101 kg)  BMI 37.05 kg/m2  SpO2 98% Body mass index is 37.05 kg/(m^2).  General Appearance:  Alert, cooperative, no distress  Head:  Normocephalic, without obvious abnormality, atraumatic  Neck: Supple, symmetrical, trachea midline  Lungs:   Clear to auscultation bilaterally, no w/r/r, respirations unlabored without use of accessory muscles.  Chest Wall:  No tenderness or deformity  Heart:  Regular rate and rhythm, S1, S2 normal, no murmur, rub or gallop.  Abdomen:   Soft, non-tender, non distended, ileal conduit in place.   Extremities: Extremities wnl.   Neurologic: Forgetful, but can answer questions with reminders.    Results for orders placed during the hospital encounter of 04/06/14 (from the past 48 hour(s))  CBC WITH DIFFERENTIAL     Status: Abnormal   Collection Time    04/06/14  5:43 PM      Result Value Ref Range   WBC 9.1  4.0 - 10.5 K/uL   RBC 4.19  3.87 - 5.11 MIL/uL   Hemoglobin 13.3  12.0 - 15.0 g/dL   HCT 38.7  36.0 - 46.0 %   MCV 92.4  78.0 - 100.0 fL   MCH 31.7  26.0 - 34.0 pg   MCHC 34.4  30.0 - 36.0 g/dL   RDW 16.1 (*) 11.5 - 15.5 %   Platelets 51 (*) 150 - 400 K/uL   Comment: REPEATED TO VERIFY     SPECIMEN CHECKED FOR CLOTS     PLATELET COUNT CONFIRMED BY SMEAR   Neutrophils Relative % 87 (*) 43 -  77 %   Neutro Abs 7.9 (*) 1.7 - 7.7 K/uL   Lymphocytes  Relative 4 (*) 12 - 46 %   Lymphs Abs 0.4 (*) 0.7 - 4.0 K/uL   Monocytes Relative 9  3 - 12 %   Monocytes Absolute 0.8  0.1 - 1.0 K/uL   Eosinophils Relative 0  0 - 5 %   Eosinophils Absolute 0.0  0.0 - 0.7 K/uL   Basophils Relative 0  0 - 1 %   Basophils Absolute 0.0  0.0 - 0.1 K/uL  COMPREHENSIVE METABOLIC PANEL     Status: Abnormal   Collection Time    04/06/14  5:43 PM      Result Value Ref Range   Sodium 137  137 - 147 mEq/L   Potassium 3.7  3.7 - 5.3 mEq/L   Comment: MODERATE HEMOLYSIS     HEMOLYSIS AT THIS LEVEL MAY AFFECT RESULT   Chloride 98  96 - 112 mEq/L   CO2 24  19 - 32 mEq/L   Glucose, Bld 149 (*) 70 - 99 mg/dL   BUN 9  6 - 23 mg/dL   Creatinine, Ser 0.67  0.50 - 1.10 mg/dL   Calcium 9.2  8.4 - 10.5 mg/dL   Total Protein 6.4  6.0 - 8.3 g/dL   Albumin 3.0 (*) 3.5 - 5.2 g/dL   AST 62 (*) 0 - 37 U/L   Comment: MODERATE HEMOLYSIS     HEMOLYSIS AT THIS LEVEL MAY AFFECT RESULT   ALT 24  0 - 35 U/L   Comment: MODERATE HEMOLYSIS     HEMOLYSIS AT THIS LEVEL MAY AFFECT RESULT   Alkaline Phosphatase 91  39 - 117 U/L   Total Bilirubin 4.4 (*) 0.3 - 1.2 mg/dL   GFR calc non Af Amer 84 (*) >90 mL/min   GFR calc Af Amer >90  >90 mL/min   Comment: (NOTE)     The eGFR has been calculated using the CKD EPI equation.     This calculation has not been validated in all clinical situations.     eGFR's persistently <90 mL/min signify possible Chronic Kidney     Disease.   Anion gap 15  5 - 15  URINALYSIS, ROUTINE W REFLEX MICROSCOPIC     Status: Abnormal   Collection Time    04/06/14  6:56 PM      Result Value Ref Range   Color, Urine AMBER (*) YELLOW   Comment: BIOCHEMICALS MAY BE AFFECTED BY COLOR   APPearance TURBID (*) CLEAR   Specific Gravity, Urine 1.010  1.005 - 1.030   pH 7.0  5.0 - 8.0   Glucose, UA NEGATIVE  NEGATIVE mg/dL   Hgb urine dipstick LARGE (*) NEGATIVE   Bilirubin Urine NEGATIVE  NEGATIVE   Ketones, ur NEGATIVE  NEGATIVE mg/dL   Protein, ur 100 (*)  NEGATIVE mg/dL   Urobilinogen, UA 1.0  0.0 - 1.0 mg/dL   Nitrite NEGATIVE  NEGATIVE   Leukocytes, UA LARGE (*) NEGATIVE  URINE MICROSCOPIC-ADD ON     Status: Abnormal   Collection Time    04/06/14  6:56 PM      Result Value Ref Range   Squamous Epithelial / LPF RARE  RARE   WBC, UA TOO NUMEROUS TO COUNT  <3 WBC/hpf   RBC / HPF 21-50  <3 RBC/hpf   Bacteria, UA MANY (*) RARE  CULTURE, BLOOD (ROUTINE X 2)     Status: None   Collection Time  04/06/14  7:36 PM      Result Value Ref Range   Specimen Description BLOOD LEFT FOREARM     Special Requests BOTTLES DRAWN AEROBIC AND ANAEROBIC 4 CC     Culture  Setup Time       Value: 04/06/2014 22:29     Performed at [facility]   Culture       Value:        BLOOD CULTURE RECEIVED NO GROWTH TO DATE CULTURE WILL BE HELD FOR 5 DAYS BEFORE ISSUING A FINAL NEGATIVE REPORT     Performed at [facility]   Report Status PENDING    CULTURE, BLOOD (ROUTINE X 2)     Status: None   Collection Time    04/06/14  7:36 PM      Result Value Ref Range   Specimen Description BLOOD LEFT ANTECUBITAL     Special Requests BOTTLES DRAWN AEROBIC AND ANAEROBIC 5 CC     Culture  Setup Time       Value: 04/06/2014 22:30     Performed at [facility]   Culture       Value:        BLOOD CULTURE RECEIVED NO GROWTH TO DATE CULTURE WILL BE HELD FOR 5 DAYS BEFORE ISSUING A FINAL NEGATIVE REPORT     Performed at [facility]   Report Status PENDING    I-STAT CG4 LACTIC ACID, ED     Status: Abnormal   Collection Time    04/06/14  7:52 PM      Result Value Ref Range   Lactic Acid, Venous 2.46 (*) 0.5 - 2.2 mmol/L  PROTIME-INR     Status: Abnormal   Collection Time    04/06/14 11:06 PM      Result Value Ref Range   Prothrombin Time 19.8 (*) 11.6 - 15.2 seconds   INR 1.68 (*) 0.00 - 7.91  BASIC METABOLIC PANEL     Status: Abnormal   Collection Time    04/07/14  4:13 AM      Result Value Ref Range   Sodium 136 (*) 137 - 147 mEq/L    Potassium 3.0 (*) 3.7 - 5.3 mEq/L   Comment: DELTA CHECK NOTED     REPEATED TO VERIFY   Chloride 102  96 - 112 mEq/L   CO2 25  19 - 32 mEq/L   Glucose, Bld 126 (*) 70 - 99 mg/dL   BUN 12  6 - 23 mg/dL   Creatinine, Ser 0.65  0.50 - 1.10 mg/dL   Calcium 8.4  8.4 - 10.5 mg/dL   GFR calc non Af Amer 85 (*) >90 mL/min   GFR calc Af Amer >90  >90 mL/min   Comment: (NOTE)     The eGFR has been calculated using the CKD EPI equation.     This calculation has not been validated in all clinical situations.     eGFR's persistently <90 mL/min signify possible Chronic Kidney     Disease.   Anion gap 9  5 - 15  CBC     Status: Abnormal   Collection Time    04/07/14  4:13 AM      Result Value Ref Range   WBC 5.3  4.0 - 10.5 K/uL   RBC 3.38 (*) 3.87 - 5.11 MIL/uL   Hemoglobin 10.7 (*) 12.0 - 15.0 g/dL   Comment: DELTA CHECK NOTED     REPEATED TO VERIFY   HCT 30.5 (*)  36.0 - 46.0 %   MCV 90.2  78.0 - 100.0 fL   MCH 31.7  26.0 - 34.0 pg   MCHC 35.1  30.0 - 36.0 g/dL   RDW 15.8 (*) 11.5 - 15.5 %   Platelets 31 (*) 150 - 400 K/uL   Comment: SPECIMEN CHECKED FOR CLOTS     REPEATED TO VERIFY     DELTA CHECK NOTED     PLATELET COUNT CONFIRMED BY SMEAR  PROTIME-INR     Status: Abnormal   Collection Time    04/07/14  4:13 AM      Result Value Ref Range   Prothrombin Time 20.9 (*) 11.6 - 15.2 seconds   INR 1.80 (*) 0.00 - 1.49  LACTIC ACID, PLASMA     Status: None   Collection Time    04/07/14  4:13 AM      Result Value Ref Range   Lactic Acid, Venous 1.5  0.5 - 2.2 mmol/L  TYPE AND SCREEN     Status: None   Collection Time    04/07/14  8:53 AM      Result Value Ref Range   ABO/RH(D) A POS     Antibody Screen NEG     Sample Expiration 04/10/2014    PREPARE PLATELET PHERESIS     Status: None   Collection Time    04/07/14  9:00 AM      Result Value Ref Range   Unit Number [id number]     Blood Component Type PLTPHER LI2     Unit division 00     Status of Unit ISSUED      Transfusion Status OK TO TRANSFUSE    PREPARE FRESH FROZEN PLASMA     Status: None   Collection Time    04/07/14  9:00 AM      Result Value Ref Range   Unit Number [id number]     Blood Component Type THAWED PLASMA     Unit division 00     Status of Unit ISSUED     Transfusion Status OK TO TRANSFUSE     Unit Number [id number]     Blood Component Type THAWED PLASMA     Unit division 00     Status of Unit ALLOCATED     Transfusion Status OK TO TRANSFUSE     Ct Abdomen Pelvis W Contrast  04/06/2014   CLINICAL DATA:  Recurrent mid abdominal pain.  EXAM: CT ABDOMEN AND PELVIS WITH CONTRAST  TECHNIQUE: Multidetector CT imaging of the abdomen and pelvis was performed using the standard protocol following bolus administration of intravenous contrast.  CONTRAST:  180m OMNIPAQUE IOHEXOL 300 MG/ML  SOLN  COMPARISON:  CT abdomen pelvis - 02/28/2014; 01/16/2012  FINDINGS: The hepatic contour remains nodular. Ill-defined hypo attenuating lesions within the medial aspect of the right lobe of the liver are grossly unchanged with index dominant hypoattenuating lesion measuring approximately 5.1 x 3.1 cm (image 19, series 2 common at dominant partially exophytic hypoattenuating lesion measuring approximately 2.8 x 2.3 cm (coronal image 60, series 4 an additional hypo attenuating lesion within the caudal aspect of the right lobe of liver measuring approximately 4.6 x 2.6 cm (image 28, series 2). There is grossly unchanged nonocclusive thrombus within the main portal vein representative coronal image 65, series 4). The right portal vein remains diminutive. Post cholecystectomy. There is a trace amount of fluid about the right lobe of the liver. The spleen remains enlarged, measuring 18.5 cm in length. Several mildly hypertrophied  gastroesophageal varices are again noted.  There has been interval displacement of previously noted approximately 1.0 x 0.9 cm nonobstructing stone into the left renal pelvis  (representative image 39, series 2; coronal image 62). There is an unchanged punctate (approximately 0.8 cm) nonobstructing stone within the inferior pole of the left kidney. The amount of left-sided pelvicaliectasis and adjacent perinephric stranding is grossly unchanged. There is symmetric enhancement of the bilateral kidneys, though there is slightly delayed excretion from the left kidney.  Unchanged approximately 6.1 x 5.3 cm hypo attenuating (-2 Hounsfield unit) renal cyst which is noted to contain a thin peripheral septation (image 33, series 3). Additional subcentimeter (approximately 1.1 cm hypo attenuating (approximately 7 Hounsfield unit) renal cyst from the superior pole left kidney. 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 No discrete right-sided renal lesions. Post right lower quadrant urostomy creation.  Ingested enteric contrast extends to the level of the transverse colon. Postsurgical changes of the sigmoid colon without evidence of enteric obstruction. A portion of the transverse colon is contain within a wide neck (approximately 8.2 cm) incisional hernia, not resulting in enteric obstruction. No pneumoperitoneum, pneumatosis or portal venous gas.  There is grossly unchanged nonspecific wall thickening involving a loop of mid small bowel (representative images 37, 43 and 51, series 2; coronal image 70, series 4), not resulting in enteric obstruction. No definable/drainable fluid collections. No pneumoperitoneum, pneumatosis or portal venous gas.  Scattered atherosclerotic plaque within a normal caliber abdominal aorta. The major branch vessels of the abdominal aorta appear patent on this non CTA examination. No retroperitoneal, mesenteric, pelvic or inguinal lymphadenopathy on this noncontrast examination.  Limited  visualization of lower thorax demonstrates minimal subsegmental atelectasis within the imaged portions of the bilateral lower lobes as well as the inferior segment of the lingula. No discrete focal airspace opacities. No pleural effusion.  Normal heart size.  No pericardial effusion.  No acute or aggressive osseous abnormalities.  IMPRESSION: 1. Grossly unchanged nonspecific wall thickening involving a short segment of a loop of mid small bowel, not resulting in enteric obstruction, the etiology of which is not depicted on this examination with differential considerations again including infectious, inflammatory and ischemic etiologies. 2. Interval displacement of approximately 1.1 cm renal stone into the left renal pelvis - this finding is of uncertain clinical significant as there is no associated significant change in previously noted mild left-sided pelvicaliectasis and uroepithelial enhancement. 3. Grossly unchanged findings of cirrhosis and portal venous hypertension with nonocclusive thrombus within the main portal vein and stigmata of portal venous hypertension including splenomegaly and gastroesophageal varices, however there is now with a trace amount of ascites adjacent to the right lobe of the liver.  4. Ill-defined hypo attenuating lesions within the medial and inferior aspects of the anterior segment of the right lobe of the liver which in the setting of cirrhosis are worrisome for hepatocellular carcinoma, though given history of prior malignancy could represent metastatic disease. 5. Extensive postsurgical change of the abdomen as above.   Electronically Signed   By: Sandi Mariscal M.D.   On: 04/06/2014 21:39    Assessment/Plan Left Nephrolithiasis  Pyelonephritis, afebrile, wbc wnl on Cipro, vitals stable Request for image guided left PCN Patient has been NPO, no blood thinners given Thrombocytopenia, plt 31k today s/p 1 unit platelets given Coagulopathy, INR 1.8 today, s/p vitamin  K and FFP,  INR pending Cirrhosis Anemia History of bladder cancer s/p cystectomy/ileal conduit in 2003 Risks and Benefits discussed with the patient and her family. All questions were answered, patient is agreeable to proceed. Consent signed by husband and in chart.   Tsosie Billing D PA-C 04/07/2014, 3:45 PM

## 2014-04-08 ENCOUNTER — Inpatient Hospital Stay (HOSPITAL_COMMUNITY): Payer: Medicare Other

## 2014-04-08 DIAGNOSIS — D72819 Decreased white blood cell count, unspecified: Secondary | ICD-10-CM

## 2014-04-08 DIAGNOSIS — A4151 Sepsis due to Escherichia coli [E. coli]: Secondary | ICD-10-CM

## 2014-04-08 DIAGNOSIS — D731 Hypersplenism: Secondary | ICD-10-CM

## 2014-04-08 DIAGNOSIS — A419 Sepsis, unspecified organism: Secondary | ICD-10-CM

## 2014-04-08 DIAGNOSIS — Z85038 Personal history of other malignant neoplasm of large intestine: Secondary | ICD-10-CM

## 2014-04-08 DIAGNOSIS — Z8551 Personal history of malignant neoplasm of bladder: Secondary | ICD-10-CM

## 2014-04-08 DIAGNOSIS — K7689 Other specified diseases of liver: Secondary | ICD-10-CM

## 2014-04-08 DIAGNOSIS — A498 Other bacterial infections of unspecified site: Secondary | ICD-10-CM

## 2014-04-08 DIAGNOSIS — N1330 Unspecified hydronephrosis: Secondary | ICD-10-CM

## 2014-04-08 LAB — BASIC METABOLIC PANEL
ANION GAP: 10 (ref 5–15)
BUN: 12 mg/dL (ref 6–23)
CALCIUM: 8.9 mg/dL (ref 8.4–10.5)
CO2: 26 mEq/L (ref 19–32)
CREATININE: 0.62 mg/dL (ref 0.50–1.10)
Chloride: 103 mEq/L (ref 96–112)
GFR calc Af Amer: >90 mL/min (ref >90)
GFR calc non Af Amer: 87 mL/min — ABNORMAL LOW (ref >90)
Glucose, Bld: 102 mg/dL — ABNORMAL HIGH (ref 70–99)
Potassium: 3.1 mEq/L — ABNORMAL LOW (ref 3.7–5.3)
Sodium: 139 mEq/L (ref 137–147)

## 2014-04-08 LAB — PREPARE PLATELET PHERESIS: Unit division: 0

## 2014-04-08 LAB — CBC
HCT: 28.6 % — ABNORMAL LOW (ref 36.0–46.0)
HEMOGLOBIN: 9.9 g/dL — AB (ref 12.0–15.0)
MCH: 31.4 pg (ref 26.0–34.0)
MCHC: 34.6 g/dL (ref 30.0–36.0)
MCV: 90.8 fL (ref 78.0–100.0)
Platelets: 37 10*3/uL — ABNORMAL LOW (ref 150–400)
RBC: 3.15 MIL/uL — ABNORMAL LOW (ref 3.87–5.11)
RDW: 15.8 % — AB (ref 11.5–15.5)
WBC: 2.5 10*3/uL — ABNORMAL LOW (ref 4.0–10.5)

## 2014-04-08 LAB — PROTIME-INR
INR: 1.54 — ABNORMAL HIGH (ref 0.00–1.49)
Prothrombin Time: 18.5 seconds — ABNORMAL HIGH (ref 11.6–15.2)

## 2014-04-08 MED ORDER — CITALOPRAM HYDROBROMIDE 20 MG PO TABS
20.0000 mg | ORAL_TABLET | Freq: Every day | ORAL | Status: DC
  Administered 2014-04-08 – 2014-04-09 (×2): 20 mg via ORAL
  Filled 2014-04-08 (×2): qty 1

## 2014-04-08 MED ORDER — POTASSIUM CHLORIDE CRYS ER 20 MEQ PO TBCR
30.0000 meq | EXTENDED_RELEASE_TABLET | Freq: Two times a day (BID) | ORAL | Status: AC
  Administered 2014-04-08 (×2): 30 meq via ORAL
  Filled 2014-04-08 (×2): qty 1

## 2014-04-08 MED ORDER — MIDAZOLAM HCL 2 MG/2ML IJ SOLN
INTRAMUSCULAR | Status: AC | PRN
  Administered 2014-04-08: 0.5 mg via INTRAVENOUS

## 2014-04-08 MED ORDER — LIDOCAINE HCL 1 % IJ SOLN
INTRAMUSCULAR | Status: AC
  Filled 2014-04-08: qty 20

## 2014-04-08 MED ORDER — CIPROFLOXACIN IN D5W 400 MG/200ML IV SOLN
INTRAVENOUS | Status: AC
  Administered 2014-04-08: 400 mg
  Filled 2014-04-08: qty 200

## 2014-04-08 MED ORDER — MIDAZOLAM HCL 2 MG/2ML IJ SOLN
INTRAMUSCULAR | Status: AC
  Filled 2014-04-08: qty 6

## 2014-04-08 MED ORDER — FENTANYL CITRATE 0.05 MG/ML IJ SOLN
INTRAMUSCULAR | Status: AC
  Filled 2014-04-08: qty 6

## 2014-04-08 MED ORDER — FENTANYL CITRATE 0.05 MG/ML IJ SOLN
INTRAMUSCULAR | Status: AC | PRN
  Administered 2014-04-08 (×2): 50 ug via INTRAVENOUS

## 2014-04-08 NOTE — Procedures (Signed)
Procedure:  Left percutaneous nephrostomy Findings:  Mild/moderate hydro by [place].  10 Fr PCN placed.  Urine clear.  Nonobstructive pelvic and LP calculi present. Will leave PCN to gravity drainage.

## 2014-04-08 NOTE — Progress Notes (Signed)
[facility] PROGRESS NOTE  [name] JHE:[id number] DOB: June 25, 1941 DOA: 04/06/2014 PCP: Florina Ou, MD  Assessment/Plan  Acute E. Coli pyelonephritis in setting of nonobstructive left kidney stone -  Last month's E. Coli was sensitive to ceftriaxone and cipro -  Continue ciprofloxacin -  Continue florastor  -  F/u sensitivities -  Appreciate urology assistance -  NPO for percutaneous nephrostomy tube to facilitate eventual nephrostolithotomy to remove the stone  Cirrhosis with chronic anemia, thrombocytopenia, elevated INR, elevated bili, low albumin, splenomegaly, varices and chronic portal vein thrombosis -  Hgb, plt, INR at baseline -  No evidence of acute bleeding  LLQ pain and abdominal pain.  Only trace ascites on CT from 2 days ago and no evidence of colitis.  Having diarrhea now -  C. Diff PCR -  Enteritic precautions  Chronic iron deficiency anemia, hgb at baseline, continue iron supplementation  Thrombocytopenia due to cirrhosis, IR asked that plt be greater than 30K, currently 37K  Cirrhosis-related coagulopathy s/p vitamin K 35m IV once and FFP on 7/17.  INR 1.54 today  HTN, BP stable.  Hold ramipril prior to procedure  GERD, stable, continue PPI  Depression, stable, resume celexa  Hx of bladder CA s/p urostomy, stable.    Mild hyponatremia, likely secondary to mild dehydration and cirrhosis, resolved.  Hypokalemia, due to poor oral intake and n.p.o., replete with oral potassium  Diet:  NPO for procedure Access:  PIV IVF:  off Proph:  SCDs   Code Status: full  Family Communication: patient alone Disposition Plan: pending nephrostomy tube placement and results of urine and blood cultures   Consultants:  Urology, Dr. FDamaris Schooner WJeffie Pollock Procedures:  CT abd/pelvis  Antibiotics:  cipro 7/16 >>  HPI/Subjective:  Having some abdominal pain and diarrhea, otherwise feeling well.      Objective: Filed Vitals:   04/07/14 1350  04/07/14 1430 04/07/14 2150 04/08/14 0453  BP: 151/63 131/68 124/92 136/52  Pulse: 64 68 68 67  Temp: 98.8 F (37.1 C) 98.7 F (37.1 C) 99.2 F (37.3 C) 98.1 F (36.7 C)  TempSrc: Oral Oral Oral Oral  Resp: 18 16 18 18   Height:      Weight:    101.8 kg (224 lb 6.9 oz)  SpO2: 97% 98% 98% 95%    Intake/Output Summary (Last 24 hours) at 04/08/14 [id number] data filed at 04/08/14 0636  Gross per 24 hour  Intake    990 ml  Output   1801 ml  Net   -811 ml   Filed Weights   04/07/14 0004 04/08/14 0453  Weight: 101 kg (222 lb 10.6 oz) 101.8 kg (224 lb 6.9 oz)    Exam:   General:  Caucasian female, No acute distress  HEENT:  NCAT, MMM  Cardiovascular:  RRR, nl S1, S2 no mrg, 2+ pulses, warm extremities  Respiratory:  CTAB, no increased WOB  Abdomen:   NABS, soft, ND, urostomy in place in the right midabdomen with clear yellow urine and tubing and slightly blood-tinged urine in the bag.  TTP in the LLQ without rebound or guarding.  MSK:   Normal tone and bulk, no LEE  Neuro:  Grossly intact  Psych: Alert but oriented to person only  Data Reviewed: Basic Metabolic Panel:  Recent Labs Lab 04/06/14 1743 04/07/14 0413 04/08/14 0410  NA 137 136* 139  K 3.7 3.0* 3.1*  CL 98 102 103  CO2 24 25 26   GLUCOSE 149* 126* 102*  BUN 9 12  12  CREATININE 0.67 0.65 0.62  CALCIUM 9.2 8.4 8.9   Liver Function Tests:  Recent Labs Lab 04/06/14 1743  AST 62*  ALT 24  ALKPHOS 91  BILITOT 4.4*  PROT 6.4  ALBUMIN 3.0*   No results found for this basename: LIPASE, AMYLASE,  in the last 168 hours No results found for this basename: AMMONIA,  in the last 168 hours CBC:  Recent Labs Lab 04/06/14 1743 04/07/14 0413 04/07/14 1454 04/08/14 0410  WBC 9.1 5.3 3.5* 2.5*  NEUTROABS 7.9*  --   --   --   HGB 13.3 10.7* 10.1* 9.9*  HCT 38.7 30.5* 29.1* 28.6*  MCV 92.4 90.2 91.8 90.8  PLT 51* 31* 39* 37*   Cardiac Enzymes: No results found for this basename: CKTOTAL, CKMB,  CKMBINDEX, TROPONINI,  in the last 168 hours BNP (last 3 results) No results found for this basename: PROBNP,  in the last 8760 hours CBG: No results found for this basename: GLUCAP,  in the last 168 hours  Recent Results (from the past 240 hour(s))  URINE CULTURE     Status: None   Collection Time    04/06/14  6:56 PM      Result Value Ref Range Status   Specimen Description URINE, RANDOM   Final   Special Requests Normal   Final   Culture  Setup Time     Final   Value: 04/06/2014 23:34     Performed at [place]     Final   Value: >=100,000 COLONIES/ML     Performed at [facility]   Culture     Final   Value: ESCHERICHIA COLI     Performed at [facility]   Report Status PENDING   Incomplete  CULTURE, BLOOD (ROUTINE X 2)     Status: None   Collection Time    04/06/14  7:36 PM      Result Value Ref Range Status   Specimen Description BLOOD LEFT FOREARM   Final   Special Requests BOTTLES DRAWN AEROBIC AND ANAEROBIC 4 CC   Final   Culture  Setup Time     Final   Value: 04/06/2014 22:29     Performed at [facility]   Culture     Final   Value:        BLOOD CULTURE RECEIVED NO GROWTH TO DATE CULTURE WILL BE HELD FOR 5 DAYS BEFORE ISSUING A FINAL NEGATIVE REPORT     Performed at [facility]   Report Status PENDING   Incomplete  CULTURE, BLOOD (ROUTINE X 2)     Status: None   Collection Time    04/06/14  7:36 PM      Result Value Ref Range Status   Specimen Description BLOOD LEFT ANTECUBITAL   Final   Special Requests BOTTLES DRAWN AEROBIC AND ANAEROBIC 5 CC   Final   Culture  Setup Time     Final   Value: 04/06/2014 22:30     Performed at [facility]   Culture     Final   Value:        BLOOD CULTURE RECEIVED NO GROWTH TO DATE CULTURE WILL BE HELD FOR 5 DAYS BEFORE ISSUING A FINAL NEGATIVE REPORT     Performed at [facility]   Report Status PENDING   Incomplete  URINE CULTURE     Status: None    Collection Time  04/07/14 12:46 AM      Result Value Ref Range Status   Specimen Description URINE, RANDOM   Final   Special Requests NONE   Final   Culture  Setup Time     Final   Value: 04/07/2014 04:55     Performed at [place]     Final   Value: >=100,000 COLONIES/ML     Performed at [facility]   Culture     Final   Value: ESCHERICHIA COLI     Performed at [facility]   Report Status PENDING   Incomplete     Studies: Ct Abdomen Pelvis W Contrast  04/06/2014   CLINICAL DATA:  Recurrent mid abdominal pain.  EXAM: CT ABDOMEN AND PELVIS WITH CONTRAST  TECHNIQUE: Multidetector CT imaging of the abdomen and pelvis was performed using the standard protocol following bolus administration of intravenous contrast.  CONTRAST:  139m OMNIPAQUE IOHEXOL 300 MG/ML  SOLN  COMPARISON:  CT abdomen pelvis - 02/28/2014; 01/16/2012  FINDINGS: The hepatic contour remains nodular. Ill-defined hypo attenuating lesions within the medial aspect of the right lobe of the liver are grossly unchanged with index dominant hypoattenuating lesion measuring approximately 5.1 x 3.1 cm (image 19, series 2 common at dominant partially exophytic hypoattenuating lesion measuring approximately 2.8 x 2.3 cm (coronal image 60, series 4 an additional hypo attenuating lesion within the caudal aspect of the right lobe of liver measuring approximately 4.6 x 2.6 cm (image 28, series 2). There is grossly unchanged nonocclusive thrombus within the main portal vein representative coronal image 65, series 4). The right portal vein remains diminutive. Post cholecystectomy. There is a trace amount of fluid about the right lobe of the liver. The spleen remains enlarged, measuring 18.5 cm in length. Several mildly hypertrophied gastroesophageal varices are again noted.  There has been interval displacement of previously noted approximately 1.0 x 0.9 cm nonobstructing stone into the left renal pelvis  (representative image 39, series 2; coronal image 62). There is an unchanged punctate (approximately 0.8 cm) nonobstructing stone within the inferior pole of the left kidney. The amount of left-sided pelvicaliectasis and adjacent perinephric stranding is grossly unchanged. There is symmetric enhancement of the bilateral kidneys, though there is slightly delayed excretion from the left kidney.  Unchanged approximately 6.1 x 5.3 cm hypo attenuating (-2 Hounsfield unit) renal cyst which is noted to contain a thin peripheral septation (image 33, series 3). Additional subcentimeter (approximately 1.1 cm hypo attenuating (approximately 7 Hounsfield unit) renal cyst from the superior pole left kidney. 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 No discrete right-sided renal lesions. Post right lower quadrant urostomy creation.  Ingested enteric contrast extends to the level of the transverse colon. Postsurgical changes of the sigmoid colon without evidence of enteric obstruction. A portion of the transverse colon is contain within a wide neck (approximately 8.2 cm) incisional hernia, not resulting in enteric obstruction. No pneumoperitoneum, pneumatosis or portal venous gas.  There is grossly unchanged nonspecific wall thickening involving a loop of mid small bowel (representative images 37, 43 and 51, series 2; coronal image 70, series 4), not resulting in enteric obstruction. No definable/drainable fluid collections. No pneumoperitoneum, pneumatosis or portal venous gas.  Scattered atherosclerotic plaque within a normal caliber abdominal aorta. The major branch vessels of the abdominal aorta  appear patent on this non CTA examination. No retroperitoneal, mesenteric, pelvic or inguinal lymphadenopathy on this noncontrast examination.  Limited  visualization of lower thorax demonstrates minimal subsegmental atelectasis within the imaged portions of the bilateral lower lobes as well as the inferior segment of the lingula. No discrete focal airspace opacities. No pleural effusion.  Normal heart size.  No pericardial effusion.  No acute or aggressive osseous abnormalities.  IMPRESSION: 1. Grossly unchanged nonspecific wall thickening involving a Jonnette Nuon segment of a loop of mid small bowel, not resulting in enteric obstruction, the etiology of which is not depicted on this examination with differential considerations again including infectious, inflammatory and ischemic etiologies. 2. Interval displacement of approximately 1.1 cm renal stone into the left renal pelvis - this finding is of uncertain clinical significant as there is no associated significant change in previously noted mild left-sided pelvicaliectasis and uroepithelial enhancement. 3. Grossly unchanged findings of cirrhosis and portal venous hypertension with nonocclusive thrombus within the main portal vein and stigmata of portal venous hypertension including splenomegaly and gastroesophageal varices, however there is now with a trace amount of ascites adjacent to the right lobe of the liver.  4. Ill-defined hypo attenuating lesions within the medial and inferior aspects of the anterior segment of the right lobe of the liver which in the setting of cirrhosis are worrisome for hepatocellular carcinoma, though given history of prior malignancy could represent metastatic disease. 5. Extensive postsurgical change of the abdomen as above.   Electronically Signed   By: Sandi Mariscal M.D.   On: 04/06/2014 21:39    Scheduled Meds: . ciprofloxacin  400 mg Intravenous Q12H  . citalopram  40 mg Oral Daily  . montelukast  10 mg Oral QHS  . pantoprazole  40 mg Oral BID AC  . potassium chloride  30 mEq Oral BID  . saccharomyces boulardii  250 mg Oral BID  . vitamin B-12  1,000 mcg Oral Daily    Continuous Infusions:    Principal Problem:   Pyelonephritis, acute Active Problems:   Bladder cancer   Thrombocytopenia   Dementia   Cirrhosis, non-alcoholic   UTI (urinary tract infection)   Nephrolithiasis   Presence of urostomy    Time spent: 30 min    [place], [facility] Pager [phone]. If 7PM-7AM, please contact night-coverage at [email or web address], password [facility] 04/08/2014, 9:21 AM  LOS: 2 days

## 2014-04-08 NOTE — Consult Note (Addendum)
[name] Robertson well-known to [name]. [name] probably have known [name] now for about 15 years. [name] initially had stage IV- locally advanced bladder cancer-back in about 2000. [name] had a cystectomy. [name] had adjuvant chemotherapy. [name] received, at that time, cis-platinum with gemcitabine.  [name] then was found have a stage II COLON cancer. [name] was high risk for recurrence. [name] received adjuvant chemotherapy with FOLOFOX for this. This was, [name] think, back in 2007.  [name] has cirrhosis. 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  [name] recently was admitted for an Escherichia coli sepsis. [name] recovered from this. [name] now admitted for another episode of Escherichia coli sepsis. [name] was admitted on July 16.  Whenever [name] does get septic, [name] does tend to drop. This Robertson based on several factors. The largest factor Robertson platelet destruction from the Escherichia coli toxin that Robertson produced with Escherichia coli sepsis. [name] will always have an element of sequestering by the spleen. This may increase to some degree during episodes of sepsis. [name] typical baseline platelet Robertson has been around 60-70,000  When [name] was admitted, [name] white cell Robertson 5.3. [name] hemoglobin was 10.7. [name] 31,000. [name] INR was 1.8. [name] PTT was 44 seconds.  [name] has a calculus in the left kidney. This was causing some hydro-nephrosis.  [name] had vitamin K which [name] apparently had a reaction to. This would be very unusual although not unheard of.   [name] did get FFP. [name] INR went down to 1.54.  [name] cannot tell if [name] got platelets. When [name] was admitted, platelet Robertson was 31,000. The latest platelet Robertson was 37,000.  [name] never has had any problems with bleeding despite the thrombocytopenia.  [name] has some degree of leukopenia. Again [name] has not had any problems with infections until recently because of the calculus.  [name] chronically  anemic. [name] does have some varices. [name] does have iron deficiency. I do give [name] IV iron on occasion. Again, [name] has had no bleeding.  [name] problem Robertson the short-term memory loss. [name] really has bad dementia. [name] has been dealing with this for several years.  Otherwise, [name] seems to be doing pretty well. [name] on IV antibiotics. [name] think [name] on Cipro.  On [name] physical exam, [name] vital signs show temperature of 98. Pulse 58. Blood pressure 150/45. Head and neck exam shows no ocular or oral lesion. [name] has no petechia or hematoma was within the oral cavity. Lungs are clear. Cardiac exam regular rate and rhythm. [name] has a 1/6 systolic ejection murmur. [name] abdomen Robertson soft. [name] has laparotomy scars. [name] has the ileostomy in the right lower quadrant. There Robertson no ascites. There Robertson no palpable liver. Spleen tip Robertson palpable with deep inspiration. Extremities shows some trace edema which Robertson chronic. Skin exam shows rare scattered ecchymoses. Neurological exam shows the dementia.  From my point of view, [name] think the biggest risk for [name] with bleeding Robertson coagulopathy. [name] has cirrhosis. [name] has some element of vitamin K deficiency [name] am sure. Again, [name] had this reaction to a vitamin K. [name] am sure that [name] has received vitamin K in the past. However, given [name] overall status, one cannot risk a anaphylactic reaction.  [name] think that [name] to have any further procedures, FFP would be the best way to optimize [name] coagulation parameters.  Again [name] am not sure [name] got platelets. If  [name] did it, [name] would be very worried that there Robertson an element of alloimmunization since [name] barely one up with a transfusion. Despite the thrombocytopenia, [name] think the risk of bleeding Robertson minimal from low platelets. [name] platelets are very functional. [name] bone marrow Robertson making platelets.. It Robertson just that [name] spleen Robertson sequestering the platelets.  If there Robertson concern regarding thrombocytopenia and any invasive procedure, [name] think the  best way to manage this Robertson to have platelets being transfused during the procedure.  It Robertson always a pleasure to see [name]. Again, [name] have known [name] for 15 years. [name] really has done well. Again, the big problem has been this dementia.  [name] certainly understand the need to remove this calculus in the lower left kidney. This Robertson acting as a nidus of infection that will continue.  [name] will be more than happy to follow along.  [name] daughter was with [name]. As always, we had excellent fellowship.  Pete E.  Phillipians 4:6-7

## 2014-04-09 DIAGNOSIS — D62 Acute posthemorrhagic anemia: Secondary | ICD-10-CM

## 2014-04-09 LAB — PREPARE FRESH FROZEN PLASMA
UNIT DIVISION: 0
Unit division: 0

## 2014-04-09 LAB — BASIC METABOLIC PANEL
ANION GAP: 8 (ref 5–15)
BUN: 12 mg/dL (ref 6–23)
CALCIUM: 9.4 mg/dL (ref 8.4–10.5)
CHLORIDE: 104 meq/L (ref 96–112)
CO2: 28 meq/L (ref 19–32)
Creatinine, Ser: 0.59 mg/dL (ref 0.50–1.10)
GFR calc Af Amer: >90 mL/min (ref >90)
GFR calc non Af Amer: 88 mL/min — ABNORMAL LOW (ref >90)
GLUCOSE: 129 mg/dL — AB (ref 70–99)
Potassium: 3.5 mEq/L — ABNORMAL LOW (ref 3.7–5.3)
Sodium: 140 mEq/L (ref 137–147)

## 2014-04-09 LAB — URINE CULTURE

## 2014-04-09 LAB — CBC
HEMATOCRIT: 30.3 % — AB (ref 36.0–46.0)
HEMOGLOBIN: 10.5 g/dL — AB (ref 12.0–15.0)
MCH: 31.8 pg (ref 26.0–34.0)
MCHC: 34.7 g/dL (ref 30.0–36.0)
MCV: 91.8 fL (ref 78.0–100.0)
Platelets: 46 10*3/uL — ABNORMAL LOW (ref 150–400)
RBC: 3.3 MIL/uL — ABNORMAL LOW (ref 3.87–5.11)
RDW: 15.8 % — ABNORMAL HIGH (ref 11.5–15.5)
WBC: 2.6 10*3/uL — ABNORMAL LOW (ref 4.0–10.5)

## 2014-04-09 MED ORDER — CIPROFLOXACIN HCL 500 MG PO TABS: 500.0000 mg | ORAL_TABLET | Freq: Two times a day (BID) | ORAL | Status: DC

## 2014-04-09 MED ORDER — ACYCLOVIR 5 % EX OINT: TOPICAL_OINTMENT | CUTANEOUS | Status: DC

## 2014-04-09 MED ORDER — TRAMADOL HCL 50 MG PO TABS
50.0000 mg | ORAL_TABLET | Freq: Four times a day (QID) | ORAL | Status: DC | PRN
  Administered 2014-04-09: 50 mg via ORAL
  Filled 2014-04-09: qty 1

## 2014-04-09 MED ORDER — ACYCLOVIR 5 % EX OINT
TOPICAL_OINTMENT | CUTANEOUS | Status: DC
  Administered 2014-04-09 (×2): via TOPICAL
  Filled 2014-04-09 (×2): qty 30

## 2014-04-09 MED ORDER — RAMIPRIL 5 MG PO CAPS
5.0000 mg | ORAL_CAPSULE | Freq: Every day | ORAL | Status: DC
  Administered 2014-04-09: 5 mg via ORAL
  Filled 2014-04-09: qty 1

## 2014-04-09 MED ORDER — SACCHAROMYCES BOULARDII 250 MG PO CAPS: 250.0000 mg | ORAL_CAPSULE | Freq: Two times a day (BID) | ORAL | Status: DC

## 2014-04-09 NOTE — Discharge Summary (Signed)
Physician Discharge Summary  [name] ZPH:[id number] DOB: April 08, 1941 DOA: 04/06/2014  PCP: Florina Ou, MD  Admit date: 04/06/2014 Discharge date: 04/09/2014  Recommendations for Outpatient Follow-up:  1. F/u in 2 weeks with Dr. Jeffie Pollock, Urology, for discussion about removal of nephrolithiasis 2. Two week course of ciprofloxacin 3. Home health RN for assistance with nephrostomy tube if needed  Discharge Diagnoses:  Principal Problem:   E. coli pyelonephritis Active Problems:   Bladder cancer   Thrombocytopenia   Dementia   Cirrhosis, non-alcoholic   Pyelonephritis, acute   Nephrolithiasis   Presence of urostomy   Discharge Condition: stable, improved  Diet recommendation: healthy heart  Wt Readings from Last 3 Encounters:  04/09/14 102.1 kg (225 lb 1.4 oz)  03/07/14 111.177 kg (245 lb 1.6 oz)  01/23/14 102.967 kg (227 lb)    History of present illness:  73 yo female h/o bladder cancer 8 years ago s/p urostomy/resection, colon cancer several years ago, NASH comes in with ams and llq and left flank pain today. She had sepsis/uti/kidney stone last month improved with abx. Vomited once today. subj fevers. No diarrhea. No cough. Family felt she was getting sick again, so brought her to ED. Found to have stone further down by upj and uti. Ostomy still draining clear yellow urine.  Hospital Course:   Acute Escherichia coli pyelonephritis in the setting of possibly intermittent obstructive left kidney stone. Her urine culture grew Escherichia coli which had similar sensitivities to be Escherichia coli urinary tract infection she had a month ago. She was started on empiric ciprofloxacin which was effective. She was also started on four-story to mitigate the risk of C. difficile colitis since this is her second long course of antibiotics within 2 months. She was seen initially by urology who recommended nephrostomy tube placement to facilitate removal of the nephrolithotomy left side  after her infection is resolved.  Nephrostomy tube was placed by interventional radiology on 7/18. Her husband received training on nephrostomy tube management and a home health nurse will visit them in a few days to make sure that they have no further questions or needs.  Hepatic cirrhosis with chronic anemia, thrombocytopenia, elevated INR, elevated bilirubin, low albumin, splenomegaly, varices, and chronic portal vein thrombosis. Her hemoglobin, platelets, and INR were near her baseline. She had no evidence of acute bleeding.  Prior to her nephrostomy tube placement, she had administration of FFP, vitamin K, and platelets.  Left lower quadrant pain. Her CT scan of the abdomen and pelvis demonstrated no evidence of colitis and only trace ascites around the liver. Although initially she had some diarrhea, this resolved and he C. difficile PCR could not be obtained. Her abdominal pain was likely secondary to her pyelonephritis and obstructive kidney stone. It is improved somewhat since admission.  Chronic iron deficiency anemia, hemoglobin at baseline and continued iron supplementation.  Thrombocytopenia, due to cirrhosis, she received a platelet transfusion prior to her procedure.  She was seen by her oncologist Dr. [name].    Cirrhosis related coagulopathy status post vitamin K 10 mg IV once. She had some shortness of breath with administration of vitamin K IV, and likely has an allergy or intolerance to the preservative in the vitamin K solution. Recommended she is vitamin K in the future that she be given oral vitamin K instead. She also received FFP on 7/17 prior to her nephrostomy tube placement.  Hypertension, blood pressure remained stable initially. Her blood pressure rose post procedure but has returned almost to  normal limits. She may continue her ramipril.  GERD and depression remained stable and she continued her home medications.  Mild hyponatremia secondary to dehydration in the  setting of cirrhosis resolved with IV fluids.  Hypokalemia due to poor oral intake and n.p.o. status, repleted with oral potassium.  Consultants:  Urology, Dr. Damaris Schooner. Jeffie Pollock Oncology, Dr. [name] Procedures:  CT abd/pelvis Antibiotics:  cipro 7/16 >>  Discharge Exam: Filed Vitals:   04/09/14 0437  BP: 148/58  Pulse: 63  Temp: 98.3 F (36.8 C)  Resp: 18   Filed Vitals:   04/08/14 1300 04/08/14 2020 04/09/14 0103 04/09/14 0437  BP: 143/71 136/71 158/87 148/58  Pulse: 62 69 88 63  Temp: 98.2 F (36.8 C) 98.9 F (37.2 C) 98.4 F (36.9 C) 98.3 F (36.8 C)  TempSrc: Oral Oral Oral Oral  Resp: 16 18  18   Height:      Weight:    102.1 kg (225 lb 1.4 oz)  SpO2: 96% 95%  96%    General: Caucasian female, No acute distress  HEENT: NCAT, MMM  Cardiovascular: RRR, nl S1, S2 no mrg, 2+ pulses, warm extremities  Respiratory: CTAB, no increased WOB  Abdomen: NABS, soft, ND, urostomy in place in the right midabdomen with clear yellow urine and tubing and slightly blood-tinged urine in the bag. Mild TTP in the LLQ without rebound or guarding.   MSK: Normal tone and bulk, no LEE.  Left lower back with nephrostomy tube in place, dressing c/d/i, TTP in this area. Tube with clear yellow urine and a streak of blood.   Neuro: Grossly intact, diffusely weak Psych: Alert but oriented to person only    Discharge Instructions      Discharge Instructions   Call MD for:  difficulty breathing, headache or visual disturbances    Complete by:  As directed      Call MD for:  extreme fatigue    Complete by:  As directed      Call MD for:  hives    Complete by:  As directed      Call MD for:  persistant dizziness or light-headedness    Complete by:  As directed      Call MD for:  persistant nausea and vomiting    Complete by:  As directed      Call MD for:  redness, tenderness, or signs of infection (pain, swelling, redness, odor or green/yellow discharge around incision site)     Complete by:  As directed      Call MD for:  severe uncontrolled pain    Complete by:  As directed      Call MD for:  temperature >100.4    Complete by:  As directed      Diet - low sodium heart healthy    Complete by:  As directed      Discharge instructions    Complete by:  As directed   Ms. Zuccaro was hospitalized with urinary tract infection and her kidney stone may have been blocking her left kidney.  She was started on antibiotics which she should continue for a total of 2 weeks (she received 3 days while in the hospital).  Her next dose of ciprofloxacin is due this evening.  She was also started on florastor, a probiotic that helps reduce the chance of infectious diarrhea since she has been on long courses of antibiotics repeatedly in the last two months.  A home health nurse will check on the nephrostomy tube  and if you have questions or need any additional equipment, the nurse will be able to help.     Increase activity slowly    Complete by:  As directed             Medication List         acetaminophen 500 MG tablet  Commonly known as:  TYLENOL  Take 1,000 mg by mouth every 6 (six) hours as needed for mild pain.     acyclovir ointment 5 %  Commonly known as:  ZOVIRAX  Apply topically every 3 (three) hours.     Cinnamon 500 MG capsule  Take 500 mg by mouth daily.     ciprofloxacin 500 MG tablet  Commonly known as:  CIPRO  Take 1 tablet (500 mg total) by mouth 2 (two) times daily.     citalopram 40 MG tablet  Commonly known as:  CELEXA  Take 20 mg by mouth daily.     fexofenadine 180 MG tablet  Commonly known as:  ALLEGRA  Take 180 mg by mouth daily.     fish oil-omega-3 fatty acids 1000 MG capsule  Take 2 g by mouth daily.     MEMORY VITE Tabs  Take 1 tablet by mouth daily.     montelukast 10 MG tablet  Commonly known as:  SINGULAIR  Take 10 mg by mouth at bedtime.     MULTI VITAMIN DAILY PO  Take 1 tablet by mouth every morning.     pantoprazole 40  MG tablet  Commonly known as:  PROTONIX  Take 1 tablet (40 mg total) by mouth 2 (two) times daily before a meal.     ramipril 5 MG capsule  Commonly known as:  ALTACE  Take 5 mg by mouth Daily.     saccharomyces boulardii 250 MG capsule  Commonly known as:  FLORASTOR  Take 1 capsule (250 mg total) by mouth 2 (two) times daily.     traMADol 50 MG tablet  Commonly known as:  ULTRAM  Take 1 tablet (50 mg total) by mouth every 6 (six) hours as needed for moderate pain.     VITAMIN B 12 PO  Take 1 tablet by mouth every morning.     Vitamin D3 1000 UNITS Caps  Take 1 capsule by mouth daily.       Follow-up Information   Follow up with Florina Ou, MD. Schedule an appointment as soon as possible for a visit in 2 weeks. (As needed)    Specialty:  Family Medicine   Contact information:   [place] [address] [phone]       Follow up with [name], MD. Schedule an appointment as soon as possible for a visit in 2 weeks.   Specialty:  Urology   Contact information:   [place] [address] [phone]        The results of significant diagnostics from this hospitalization (including imaging, microbiology, ancillary and laboratory) are listed below for reference.    Significant Diagnostic Studies: Ct Abdomen Pelvis W Contrast  04/06/2014   CLINICAL DATA:  Recurrent mid abdominal pain.  EXAM: CT ABDOMEN AND PELVIS WITH CONTRAST  TECHNIQUE: Multidetector CT imaging of the abdomen and pelvis was performed using the standard protocol following bolus administration of intravenous contrast.  CONTRAST:  181m OMNIPAQUE IOHEXOL 300 MG/ML  SOLN  COMPARISON:  CT abdomen pelvis - 02/28/2014; 01/16/2012  FINDINGS: The hepatic contour  remains nodular. Ill-defined hypo attenuating lesions within the medial aspect of the right lobe of the liver are grossly unchanged with index dominant hypoattenuating lesion measuring  approximately 5.1 x 3.1 cm (image 19, series 2 common at dominant partially exophytic hypoattenuating lesion measuring approximately 2.8 x 2.3 cm (coronal image 60, series 4 an additional hypo attenuating lesion within the caudal aspect of the right lobe of liver measuring approximately 4.6 x 2.6 cm (image 28, series 2). There is grossly unchanged nonocclusive thrombus within the main portal vein representative coronal image 65, series 4). The right portal vein remains diminutive. Post cholecystectomy. There is a trace amount of fluid about the right lobe of the liver. The spleen remains enlarged, measuring 18.5 cm in length. Several mildly hypertrophied gastroesophageal varices are again noted.  There has been interval displacement of previously noted approximately 1.0 x 0.9 cm nonobstructing stone into the left renal pelvis (representative image 39, series 2; coronal image 62). There is an unchanged punctate (approximately 0.8 cm) nonobstructing stone within the inferior pole of the left kidney. The amount of left-sided pelvicaliectasis and adjacent perinephric stranding is grossly unchanged. There is symmetric enhancement of the bilateral kidneys, though there is slightly delayed excretion from the left kidney.  Unchanged approximately 6.1 x 5.3 cm hypo attenuating (-2 Hounsfield unit) renal cyst which is noted to contain a thin peripheral septation (image 33, series 3). Additional subcentimeter (approximately 1.1 cm hypo attenuating (approximately 7 Hounsfield unit) renal cyst from the superior pole left kidney. 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 No discrete right-sided renal lesions. Post right lower quadrant urostomy creation.  Ingested enteric contrast extends to the level of the transverse colon. Postsurgical changes of the  sigmoid colon without evidence of enteric obstruction. A portion of the transverse colon is contain within a wide neck (approximately 8.2 cm) incisional hernia, not resulting in enteric obstruction. No pneumoperitoneum, pneumatosis or portal venous gas.  There is grossly unchanged nonspecific wall thickening involving a loop of mid small bowel (representative images 37, 43 and 51, series 2; coronal image 70, series 4), not resulting in enteric obstruction. No definable/drainable fluid collections. No pneumoperitoneum, pneumatosis or portal venous gas.  Scattered atherosclerotic plaque within a normal caliber abdominal aorta. The major branch vessels of the abdominal aorta appear patent on this non CTA examination. No retroperitoneal, mesenteric, pelvic or inguinal lymphadenopathy on this noncontrast examination.  Limited visualization of lower thorax demonstrates minimal subsegmental atelectasis within the imaged portions of the bilateral lower lobes as well as the inferior segment of the lingula. No discrete focal airspace opacities. No pleural effusion.  Normal heart size.  No pericardial effusion.  No acute or aggressive osseous abnormalities.  IMPRESSION: 1. Grossly unchanged nonspecific wall thickening involving a Yadriel Kerrigan segment of a loop of mid small bowel, not resulting in enteric obstruction, the etiology of which is not depicted on this examination with differential considerations again including infectious, inflammatory and ischemic etiologies. 2. Interval displacement of approximately 1.1 cm renal stone into the left renal pelvis - this finding is of uncertain clinical significant as there is no associated significant change in previously noted mild left-sided pelvicaliectasis and uroepithelial enhancement. 3. Grossly unchanged findings of cirrhosis and portal venous hypertension with nonocclusive thrombus within the main portal vein and stigmata of portal venous hypertension including splenomegaly and  gastroesophageal varices, however  there is now with a trace amount of ascites adjacent to the right lobe of the liver.  4. Ill-defined hypo attenuating lesions within the medial and inferior aspects of the anterior segment of the right lobe of the liver which in the setting of cirrhosis are worrisome for hepatocellular carcinoma, though given history of prior malignancy could represent metastatic disease. 5. Extensive postsurgical change of the abdomen as above.   Electronically Signed   By: Sandi Mariscal M.D.   On: 04/06/2014 21:39   Ir Perc Nephrostomy Left  04/08/2014   CLINICAL DATA:  Obstructing left pelvic renal calculus with hydronephrosis and pyelonephritis.  EXAM: 1. ULTRASOUND GUIDANCE FOR PUNCTURE OF THE LEFT RENAL COLLECTING SYSTEM. 2. LEFT PERCUTANEOUS NEPHROSTOMY TUBE PLACEMENT.  COMPARISON:  CT of the abdomen and pelvis on 04/06/2014.  ANESTHESIA/SEDATION: 0.5 mg IV Versed; 100 mcg IV Fentanyl.  Total Moderate Sedation Time  20 minutes  CONTRAST:  31m Omnipaque 300  MEDICATIONS: 400 mg IV Cipro. Ciprofloxacin was given within two hours of incision.  FLUOROSCOPY TIME:  1 minute and 24 seconds.  PROCEDURE: The procedure, risks, benefits, and alternatives were explained to the patient. Questions regarding the procedure were encouraged and answered. The patient understands and consents to the procedure.  The left flank region was prepped with Betadine in a sterile fashion, and a sterile drape was applied covering the operative field. A sterile gown and sterile gloves were used for the procedure. Local anesthesia was provided with 1% Lidocaine.  Ultrasound was used to localize the left kidney. Under direct ultrasound guidance, a 21 gauge needle was advanced into the renal collecting system. Ultrasound image documentation was performed. Aspiration of urine sample was performed followed by contrast injection.  A transitional dilator was advanced over a guidewire. Percutaneous tract dilatation was then  performed over the guidewire. A 10 -French percutaneous nephrostomy tube was then advanced and formed in the collecting system. Catheter position was confirmed by fluoroscopy after contrast injection.  The catheter was secured at the skin with a Prolene retention suture and Stat-Lock device. A gravity bag was placed.  COMPLICATIONS: None.  FINDINGS: Ultrasound demonstrates mild to moderate left hydronephrosis. A single puncture was performed at the level of the mid kidney. Clear urine return was present. The nephrostomy tube was formed at the level of the renal pelvis. Contrast injection shows some flow around the pelvic calculus in into the ureter with additional nonobstructing calculus noted in the lower pole.  IMPRESSION: Placement of 10 French left nephrostomy tube. This will be left to gravity drainage. Pelvic calculus remains causing mild to moderate hydronephrosis with some contrast seen to pass around the calculus and into the ureter. A smaller nonobstructing lower pole calculus is also present.   Electronically Signed   By: GAletta EdouardM.D.   On: 04/08/2014 12:09   Ir [place] Bx Asp/drain  04/08/2014   CLINICAL DATA:  Obstructing left pelvic renal calculus with hydronephrosis and pyelonephritis.  EXAM: 1. ULTRASOUND GUIDANCE FOR PUNCTURE OF THE LEFT RENAL COLLECTING SYSTEM. 2. LEFT PERCUTANEOUS NEPHROSTOMY TUBE PLACEMENT.  COMPARISON:  CT of the abdomen and pelvis on 04/06/2014.  ANESTHESIA/SEDATION: 0.5 mg IV Versed; 100 mcg IV Fentanyl.  Total Moderate Sedation Time  20 minutes  CONTRAST:  163mOmnipaque 300  MEDICATIONS: 400 mg IV Cipro. Ciprofloxacin was given within two hours of incision.  FLUOROSCOPY TIME:  1 minute and 24 seconds.  PROCEDURE: The procedure, risks, benefits, and alternatives were explained to the patient. Questions regarding the procedure  were encouraged and answered. The patient understands and consents to the procedure.  The left flank region was prepped with Betadine in a  sterile fashion, and a sterile drape was applied covering the operative field. A sterile gown and sterile gloves were used for the procedure. Local anesthesia was provided with 1% Lidocaine.  Ultrasound was used to localize the left kidney. Under direct ultrasound guidance, a 21 gauge needle was advanced into the renal collecting system. Ultrasound image documentation was performed. Aspiration of urine sample was performed followed by contrast injection.  A transitional dilator was advanced over a guidewire. Percutaneous tract dilatation was then performed over the guidewire. A 10 -French percutaneous nephrostomy tube was then advanced and formed in the collecting system. Catheter position was confirmed by fluoroscopy after contrast injection.  The catheter was secured at the skin with a Prolene retention suture and Stat-Lock device. A gravity bag was placed.  COMPLICATIONS: None.  FINDINGS: Ultrasound demonstrates mild to moderate left hydronephrosis. A single puncture was performed at the level of the mid kidney. Clear urine return was present. The nephrostomy tube was formed at the level of the renal pelvis. Contrast injection shows some flow around the pelvic calculus in into the ureter with additional nonobstructing calculus noted in the lower pole.  IMPRESSION: Placement of 10 French left nephrostomy tube. This will be left to gravity drainage. Pelvic calculus remains causing mild to moderate hydronephrosis with some contrast seen to pass around the calculus and into the ureter. A smaller nonobstructing lower pole calculus is also present.   Electronically Signed   By: Aletta Edouard M.D.   On: 04/08/2014 12:09    Microbiology: Recent Results (from the past 240 hour(s))  URINE CULTURE     Status: None   Collection Time    04/06/14  6:56 PM      Result Value Ref Range Status   Specimen Description URINE, RANDOM   Final   Special Requests Normal   Final   Culture  Setup Time     Final   Value:  04/06/2014 23:34     Performed at [facility]     Final   Value: >=100,000 COLONIES/ML     Performed at [facility]   Culture     Final   Value: ESCHERICHIA COLI     Performed at [facility]   Report Status PENDING   Incomplete  CULTURE, BLOOD (ROUTINE X 2)     Status: None   Collection Time    04/06/14  7:36 PM      Result Value Ref Range Status   Specimen Description BLOOD LEFT FOREARM   Final   Special Requests BOTTLES DRAWN AEROBIC AND ANAEROBIC 4 CC   Final   Culture  Setup Time     Final   Value: 04/06/2014 22:29     Performed at [facility]   Culture     Final   Value:        BLOOD CULTURE RECEIVED NO GROWTH TO DATE CULTURE WILL BE HELD FOR 5 DAYS BEFORE ISSUING A FINAL NEGATIVE REPORT     Performed at [facility]   Report Status PENDING   Incomplete  CULTURE, BLOOD (ROUTINE X 2)     Status: None   Collection Time    04/06/14  7:36 PM      Result Value Ref Range Status   Specimen Description BLOOD LEFT ANTECUBITAL   Final  Special Requests BOTTLES DRAWN AEROBIC AND ANAEROBIC 5 CC   Final   Culture  Setup Time     Final   Value: 04/06/2014 22:30     Performed at [facility]   Culture     Final   Value:        BLOOD CULTURE RECEIVED NO GROWTH TO DATE CULTURE WILL BE HELD FOR 5 DAYS BEFORE ISSUING A FINAL NEGATIVE REPORT     Performed at [facility]   Report Status PENDING   Incomplete  URINE CULTURE     Status: None   Collection Time    04/07/14 12:46 AM      Result Value Ref Range Status   Specimen Description URINE, RANDOM   Final   Special Requests NONE   Final   Culture  Setup Time     Final   Value: 04/07/2014 04:55     Performed at [facility]     Final   Value: >=100,000 COLONIES/ML     Performed at [facility]   Culture     Final   Value: ESCHERICHIA COLI     Performed at [facility]   Report Status 04/09/2014 FINAL   Final   Organism  ID, Bacteria ESCHERICHIA COLI   Final     Labs: Basic Metabolic Panel:  Recent Labs Lab 04/06/14 1743 04/07/14 0413 04/08/14 0410 04/09/14 0458  NA 137 136* 139 140  K 3.7 3.0* 3.1* 3.5*  CL 98 102 103 104  CO2 24 25 26 28   GLUCOSE 149* 126* 102* 129*  BUN 9 12 12 12   CREATININE 0.67 0.65 0.62 0.59  CALCIUM 9.2 8.4 8.9 9.4   Liver Function Tests:  Recent Labs Lab 04/06/14 1743  AST 62*  ALT 24  ALKPHOS 91  BILITOT 4.4*  PROT 6.4  ALBUMIN 3.0*   No results found for this basename: LIPASE, AMYLASE,  in the last 168 hours No results found for this basename: AMMONIA,  in the last 168 hours CBC:  Recent Labs Lab 04/06/14 1743 04/07/14 0413 04/07/14 1454 04/08/14 0410 04/09/14 0458  WBC 9.1 5.3 3.5* 2.5* 2.6*  NEUTROABS 7.9*  --   --   --   --   HGB 13.3 10.7* 10.1* 9.9* 10.5*  HCT 38.7 30.5* 29.1* 28.6* 30.3*  MCV 92.4 90.2 91.8 90.8 91.8  PLT 51* 31* 39* 37* 46*   Cardiac Enzymes: No results found for this basename: CKTOTAL, CKMB, CKMBINDEX, TROPONINI,  in the last 168 hours BNP: BNP (last 3 results) No results found for this basename: PROBNP,  in the last 8760 hours CBG: No results found for this basename: GLUCAP,  in the last 168 hours  Time coordinating discharge: 45 minutes  Signed:  Arion Shankles  [facility] 04/09/2014, 9:31 AM

## 2014-04-09 NOTE — Progress Notes (Signed)
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   Status of service:  Completed, signed off Medicare Important Message given?  YES (If response is "NO", the following Medicare IM given date fields will be blank) Date Medicare IM given:  04/09/2014 Medicare IM given by:  [facility] Date Additional Medicare IM given:   Additional Medicare IM given by:    Discharge Disposition:  [place]  Per UR Regulation:  Reviewed for med. necessity/level of care/duration of stay  If discussed at [place] of Stay Meetings, dates discussed:    Comments:  04/09/2014 1220 NCM spoke to pt and offered choice for [facility]. Husband states she had [facility] in the past. [facility] RN did demonstrated to him how to care for nephrostomy tube until [facility] RN comes to do teaching on how to manage at home. Notified [facility] for [facility] for scheduled dc home today. [facility] will do a soc for 04/10/2014.  Jonnie Finner RN CCM Case Mgmt phoone [phone]  [id number] Rosana Hoes, RN, BSN, CCM: CHART REVIEWED AND UPDATED.  Next chart review due on [id number]. NO DISCHARGE NEEDS PRESENT AT THIS TIME WILL CONTINUE  TO FOLLOW. CASE MANAGEMENT [phone]

## 2014-04-09 NOTE — Progress Notes (Signed)
Patient discharged home with husband. Discharge instructions given and explained to patient/husband and they verbalized understanding. No distress noted, skin intact with bruises on bilateral arm. Accompanied home by husband. Home health RN/PT to follow up with care at home. Surgical  Wound  Left flank dressing changed and demonstrated to patient's husband(the primary care giver) and he verbalized understanding, no sign of infection noted.

## 2014-04-09 NOTE — Evaluation (Signed)
Physical Therapy Evaluation Patient Details Name: [name] MRN: [id number] DOB: Jan 29, 1941 Today's Date: 04/09/2014   History of Present Illness  73 yo female admitted with pyelonephritis. s/p L percutaneous nephrostomy 7/18. Hx of dementia, bladder ca, colon ca.   Clinical Impression  On eval, pt required Min assist for mobility-able to ambulate ~200 feet with walker. Pt set to d/c home today with husband. Educated husband on ensuring pt has someone with her when mobilizing/24 hour supervision.     Follow Up Recommendations Home health PT;Supervision/Assistance - 24 hour    Equipment Recommendations  None recommended by PT    Recommendations for Other Services       Precautions / Restrictions Precautions Precautions: Fall Precaution Comments: L flank drain Restrictions Weight Bearing Restrictions: No      Mobility  Bed Mobility Overal bed mobility: Needs Assistance Bed Mobility: Supine to Sit     Supine to sit: Min guard;HOB elevated     General bed mobility comments: close guard for safety.   Transfers Overall transfer level: Needs assistance Equipment used: Rolling walker (2 wheeled) Transfers: Sit to/from Stand Sit to Stand: Min assist         General transfer comment: assist to rise, stabilize, control descent.   Ambulation/Gait Ambulation/Gait assistance: Min assist Ambulation Distance (Feet): 200 Feet Assistive device: Rolling walker (2 wheeled) Gait Pattern/deviations: Step-through pattern     General Gait Details: assist to stabilize intermittently and to maneuver with rw.   Stairs            Wheelchair Mobility    Modified Rankin (Stroke Patients Only)       Balance                                             Pertinent Vitals/Pain L hip/back area with activity 5/10.     Home Living Family/patient expects to be discharged to:: Private residence Living Arrangements: Spouse/significant other Available  Help at Discharge: Personal care attendant;Family (aide 3x/week 4 hours/day)   Home Access: Stairs to enter Entrance Stairs-Rails: Psychiatric nurse of Steps: 3 Home Layout: Two level;Able to live on main level with bedroom/bathroom Home Equipment: Walker - 4 wheels;Cane - single point;Bedside commode;Grab bars - tub/shower;Shower seat      Prior Function Level of Independence: Independent with assistive device(s);Needs assistance   Gait / Transfers Assistance Needed: walks unassisted with walker or cane  ADL's / Homemaking Assistance Needed: spouse helps in shower (has caregiver as well--- 3x/wk 4 hours/day)        Hand Dominance        Extremity/Trunk Assessment   Upper Extremity Assessment: Generalized weakness           Lower Extremity Assessment: Generalized weakness      Cervical / Trunk Assessment: Normal  Communication   Communication: No difficulties  Cognition Arousal/Alertness: Awake/alert Behavior During Therapy: WFL for tasks assessed/performed Overall Cognitive Status: History of cognitive impairments - at baseline                      General Comments      Exercises        Assessment/Plan    PT Assessment All further PT needs can be met in the next venue of care  PT Diagnosis Difficulty walking;Generalized weakness;Acute pain   PT Problem List Decreased strength;Decreased activity tolerance;Decreased balance;Decreased  mobility;Pain;Decreased knowledge of use of DME  PT Treatment Interventions DME instruction;Gait training;Functional mobility training;Therapeutic activities;Therapeutic exercise;Patient/family education;Balance training   PT Goals (Current goals can be found in the Care Plan section) Acute Rehab PT Goals Patient Stated Goal: home PT Goal Formulation: No goals set, d/c therapy    Frequency Min 3X/week   Barriers to discharge        Co-evaluation               End of Session   Activity  Tolerance: Patient tolerated treatment well Patient left: in chair;with call bell/phone within reach;with family/visitor present           Time: 1686-1042 PT Time Calculation (min): 29 min   Charges:   PT Evaluation $Initial PT Evaluation Tier I: 1 Procedure PT Treatments $Gait Training: 23-37 mins   PT G Codes:          [place]on Anna, MPT Pager: [phone]

## 2014-04-10 DIAGNOSIS — F0390 Unspecified dementia without behavioral disturbance: Secondary | ICD-10-CM | POA: Diagnosis not present

## 2014-04-10 DIAGNOSIS — Z936 Other artificial openings of urinary tract status: Secondary | ICD-10-CM | POA: Diagnosis not present

## 2014-04-10 DIAGNOSIS — Z8551 Personal history of malignant neoplasm of bladder: Secondary | ICD-10-CM | POA: Diagnosis not present

## 2014-04-10 DIAGNOSIS — N10 Acute tubulo-interstitial nephritis: Secondary | ICD-10-CM | POA: Diagnosis not present

## 2014-04-10 DIAGNOSIS — Z436 Encounter for attention to other artificial openings of urinary tract: Secondary | ICD-10-CM | POA: Diagnosis not present

## 2014-04-10 DIAGNOSIS — L989 Disorder of the skin and subcutaneous tissue, unspecified: Secondary | ICD-10-CM | POA: Diagnosis not present

## 2014-04-10 DIAGNOSIS — K7460 Unspecified cirrhosis of liver: Secondary | ICD-10-CM | POA: Diagnosis not present

## 2014-04-10 LAB — URINE CULTURE
Colony Count: >=100000
Special Requests: NORMAL

## 2014-04-12 DIAGNOSIS — K7460 Unspecified cirrhosis of liver: Secondary | ICD-10-CM | POA: Diagnosis not present

## 2014-04-12 DIAGNOSIS — Z8551 Personal history of malignant neoplasm of bladder: Secondary | ICD-10-CM | POA: Diagnosis not present

## 2014-04-12 DIAGNOSIS — L989 Disorder of the skin and subcutaneous tissue, unspecified: Secondary | ICD-10-CM | POA: Diagnosis not present

## 2014-04-12 DIAGNOSIS — Z436 Encounter for attention to other artificial openings of urinary tract: Secondary | ICD-10-CM | POA: Diagnosis not present

## 2014-04-12 DIAGNOSIS — F0390 Unspecified dementia without behavioral disturbance: Secondary | ICD-10-CM | POA: Diagnosis not present

## 2014-04-12 DIAGNOSIS — N10 Acute tubulo-interstitial nephritis: Secondary | ICD-10-CM | POA: Diagnosis not present

## 2014-04-12 LAB — CULTURE, BLOOD (ROUTINE X 2)
CULTURE: NO GROWTH
CULTURE: NO GROWTH

## 2014-04-13 DIAGNOSIS — K7460 Unspecified cirrhosis of liver: Secondary | ICD-10-CM | POA: Diagnosis not present

## 2014-04-13 DIAGNOSIS — F0390 Unspecified dementia without behavioral disturbance: Secondary | ICD-10-CM | POA: Diagnosis not present

## 2014-04-13 DIAGNOSIS — Z436 Encounter for attention to other artificial openings of urinary tract: Secondary | ICD-10-CM | POA: Diagnosis not present

## 2014-04-13 DIAGNOSIS — N10 Acute tubulo-interstitial nephritis: Secondary | ICD-10-CM | POA: Diagnosis not present

## 2014-04-13 DIAGNOSIS — Z8551 Personal history of malignant neoplasm of bladder: Secondary | ICD-10-CM | POA: Diagnosis not present

## 2014-04-13 DIAGNOSIS — L989 Disorder of the skin and subcutaneous tissue, unspecified: Secondary | ICD-10-CM | POA: Diagnosis not present

## 2014-04-19 DIAGNOSIS — N10 Acute tubulo-interstitial nephritis: Secondary | ICD-10-CM | POA: Diagnosis not present

## 2014-04-19 DIAGNOSIS — F0390 Unspecified dementia without behavioral disturbance: Secondary | ICD-10-CM | POA: Diagnosis not present

## 2014-04-19 DIAGNOSIS — K7460 Unspecified cirrhosis of liver: Secondary | ICD-10-CM | POA: Diagnosis not present

## 2014-04-19 DIAGNOSIS — Z436 Encounter for attention to other artificial openings of urinary tract: Secondary | ICD-10-CM | POA: Diagnosis not present

## 2014-04-20 DIAGNOSIS — M545 Low back pain, unspecified: Secondary | ICD-10-CM | POA: Diagnosis not present

## 2014-04-20 DIAGNOSIS — N200 Calculus of kidney: Secondary | ICD-10-CM | POA: Diagnosis not present

## 2014-04-21 ENCOUNTER — Other Ambulatory Visit: Payer: Self-pay | Admitting: Urology

## 2014-04-22 DIAGNOSIS — L989 Disorder of the skin and subcutaneous tissue, unspecified: Secondary | ICD-10-CM | POA: Diagnosis not present

## 2014-04-22 DIAGNOSIS — Z436 Encounter for attention to other artificial openings of urinary tract: Secondary | ICD-10-CM | POA: Diagnosis not present

## 2014-04-22 DIAGNOSIS — F0390 Unspecified dementia without behavioral disturbance: Secondary | ICD-10-CM | POA: Diagnosis not present

## 2014-04-22 DIAGNOSIS — Z8551 Personal history of malignant neoplasm of bladder: Secondary | ICD-10-CM | POA: Diagnosis not present

## 2014-04-22 DIAGNOSIS — N10 Acute tubulo-interstitial nephritis: Secondary | ICD-10-CM | POA: Diagnosis not present

## 2014-04-22 DIAGNOSIS — K7460 Unspecified cirrhosis of liver: Secondary | ICD-10-CM | POA: Diagnosis not present

## 2014-04-26 DIAGNOSIS — L989 Disorder of the skin and subcutaneous tissue, unspecified: Secondary | ICD-10-CM | POA: Diagnosis not present

## 2014-04-26 DIAGNOSIS — Z8551 Personal history of malignant neoplasm of bladder: Secondary | ICD-10-CM | POA: Diagnosis not present

## 2014-04-26 DIAGNOSIS — Z436 Encounter for attention to other artificial openings of urinary tract: Secondary | ICD-10-CM | POA: Diagnosis not present

## 2014-04-26 DIAGNOSIS — F0390 Unspecified dementia without behavioral disturbance: Secondary | ICD-10-CM | POA: Diagnosis not present

## 2014-04-26 DIAGNOSIS — N10 Acute tubulo-interstitial nephritis: Secondary | ICD-10-CM | POA: Diagnosis not present

## 2014-04-26 DIAGNOSIS — K7460 Unspecified cirrhosis of liver: Secondary | ICD-10-CM | POA: Diagnosis not present

## 2014-04-27 ENCOUNTER — Encounter (HOSPITAL_COMMUNITY): Payer: Self-pay | Admitting: Pharmacy Technician

## 2014-04-27 NOTE — Patient Instructions (Addendum)
[name]  04/27/2014                           YOUR PROCEDURE IS SCHEDULED ON: 05/04/14               ENTER THRU  MAIN HOSPITAL ENTRANCE AND                            FOLLOW  SIGNS TO SHORT STAY CENTER                 ARRIVE AT SHORT STAY AT: 8:45 AM               CALL THIS NUMBER IF ANY PROBLEMS THE DAY OF SURGERY :               [phone]                                REMEMBER:   Do not eat food or drink liquids AFTER MIDNIGHT                  Take these medicines the morning of surgery with               A SIPS OF WATER :    Citalopram / may take TRAMADOL IF NEEDED FOR PAIN     Do not wear jewelry, make-up   Do not wear lotions, powders, or perfumes.   Do not shave legs or underarms 12 hrs. before surgery (men may shave face)  Do not bring valuables to the hospital.  Contacts, dentures or bridgework may not be worn into surgery.  Leave suitcase in the car. After surgery it may be brought to your room.  For patients admitted to the hospital more than one night, checkout time is            11:00 AM                                                       The day of discharge.   Patients discharged the day of surgery will not be allowed to drive home.            If going home same day of surgery, must have someone stay with you              FIRST 24 hrs at home and arrange for some one to drive you              home from hospital.   ________________________________________________________________________                                                                        [facility]  Before surgery, you can play an important role.  Because skin is not sterile, your skin needs to be as free of germs as possible.  You can reduce the  number of germs on your skin by washing with CHG (chlorahexidine gluconate) soap before surgery.  CHG is an antiseptic cleaner which kills germs and bonds with the skin to continue  killing germs even after washing. Please DO NOT use if you have an allergy to CHG or antibacterial soaps.  If your skin becomes reddened/irritated stop using the CHG and inform your nurse when you arrive at Short Stay. Do not shave (including legs and underarms) for at least 48 hours prior to the first CHG shower.  You may shave your face. Please follow these instructions carefully:   1.  Shower with CHG Soap the night before surgery and the  morning of Surgery.   2.  If you choose to wash your hair, wash your hair first as usual with your  normal  Shampoo.   3.  After you shampoo, rinse your hair and body thoroughly to remove the  shampoo.                                         4.  Use CHG as you would any other liquid soap.  You can apply chg directly  to the skin and wash . Gently wash with scrungie or clean wascloth    5.  Apply the CHG Soap to your body ONLY FROM THE NECK DOWN.   Do not use on open                           Wound or open sores. Avoid contact with eyes, ears mouth and genitals (private parts).                        Genitals (private parts) with your normal soap.              6.  Wash thoroughly, paying special attention to the area where your surgery  will be performed.   7.  Thoroughly rinse your body with warm water from the neck down.   8.  DO NOT shower/wash with your normal soap after using and rinsing off  the CHG Soap .                9.  Pat yourself dry with a clean towel.             10.  Wear clean pajamas.             11.  Place clean sheets on your bed the night of your first shower and do not  sleep with pets.  Day of Surgery : Do not apply any lotions/deodorants the morning of surgery.  Please wear clean clothes to the hospital/surgery center.  FAILURE TO FOLLOW THESE INSTRUCTIONS MAY RESULT IN THE CANCELLATION OF YOUR SURGERY    PATIENT  SIGNATURE_________________________________  ______________________________________________________________________

## 2014-04-28 ENCOUNTER — Encounter (HOSPITAL_COMMUNITY): Payer: Self-pay

## 2014-04-28 ENCOUNTER — Encounter (HOSPITAL_COMMUNITY)
Admission: RE | Admit: 2014-04-28 | Discharge: 2014-04-28 | Disposition: A | Discharge status: Home / Self Care | Payer: Medicare Other | Source: Ambulatory Visit | Attending: Urology | Admitting: Urology

## 2014-04-28 DIAGNOSIS — Z01812 Encounter for preprocedural laboratory examination: Secondary | ICD-10-CM | POA: Diagnosis not present

## 2014-04-28 DIAGNOSIS — Z01818 Encounter for other preprocedural examination: Secondary | ICD-10-CM | POA: Insufficient documentation

## 2014-04-28 HISTORY — DX: Personal history of other medical treatment: Z92.89

## 2014-04-28 HISTORY — DX: Personal history of urinary calculi: Z87.442

## 2014-04-28 LAB — CBC
HCT: 37.5 % (ref 36.0–46.0)
Hemoglobin: 12.8 g/dL (ref 12.0–15.0)
MCH: 31.7 pg (ref 26.0–34.0)
MCHC: 34.1 g/dL (ref 30.0–36.0)
MCV: 92.8 fL (ref 78.0–100.0)
RBC: 4.04 MIL/uL (ref 3.87–5.11)
RDW: 15.7 % — ABNORMAL HIGH (ref 11.5–15.5)
WBC: 3.4 10*3/uL — AB (ref 4.0–10.5)

## 2014-04-28 LAB — BASIC METABOLIC PANEL
ANION GAP: 9 (ref 5–15)
BUN: 8 mg/dL (ref 6–23)
CHLORIDE: 102 meq/L (ref 96–112)
CO2: 28 mEq/L (ref 19–32)
Calcium: 9.2 mg/dL (ref 8.4–10.5)
Creatinine, Ser: 0.53 mg/dL (ref 0.50–1.10)
GFR calc non Af Amer: >90 mL/min (ref >90)
Glucose, Bld: 126 mg/dL — ABNORMAL HIGH (ref 70–99)
POTASSIUM: 3.6 meq/L — AB (ref 3.7–5.3)
Sodium: 139 mEq/L (ref 137–147)

## 2014-04-28 NOTE — Progress Notes (Signed)
04/28/14 1335  OBSTRUCTIVE SLEEP APNEA  Have you ever been diagnosed with sleep apnea through a sleep study? No  Do you snore loudly (loud enough to be heard through closed doors)?  1  Do you often feel tired, fatigued, or sleepy during the daytime? 1  Has anyone observed you stop breathing during your sleep? 0  Do you have, or are you being treated for high blood pressure? 1  BMI more than 35 kg/m2? 1  Age over 75 years old? 1  Neck circumference greater than 40 cm/16 inches? 0  Gender: 0  Obstructive Sleep Apnea Score 5  Score 4 or greater  Results sent to PCP

## 2014-05-01 NOTE — H&P (Signed)
Active Problems Problems  1. Nephrolithiasis (592.0) 2. Postoperative incisional hernia (553.21) 3. 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 She was seen in the hospital with urosepsis in mid July from a 1.1cm left ureteral stone. She had a left perc tube placed and is doing well now but has some discomfort. She has had some hematuria but it is clearing.  She has had no low grade fever on occasion. Today was the last day of her cipro script.  She has no other associated signs or symptoms.   Past Medical History Problems  1. History of Abdominal pain, RLQ (right lower quadrant) (789.03) 2. History of Anxiety (300.00) 3. History of Arthritis (V13.4) 4. History of Cancer (199.1) 5. History of Chronic Liver Disease (571.9) 6. History of Colon Cancer (V10.05) 7. History of Dementia (294.20) 8. History of bronchitis (V12.69) 9. History of depression (V11.8) 10. History of hypertension (V12.59) 11. History of renal calculi (V13.01) 12. History of sleep apnea (V13.89) 13. History of splenomegaly (V12.3) 14. History of Lower Urinary Tract Calculus (594.9) 15. Personal history of bladder cancer (V10.51) 16. 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 History of Bladder Surgery 2. History of Colon Surgery 3. History of Complete Colonoscopy 4. History of Knee Surgery Left 5. History of Nephrostomy With Drainage  Current Meds 1. Cinnamon CAPS;  Therapy: (Recorded:05Mar2014) to Recorded 2. Citalopram Hydrobromide 40 MG Oral Tablet;  Therapy: (Recorded:24Feb2012) to Recorded 3. Fexofenadine HCl - 180 MG Oral Tablet;  Therapy: (Recorded:30Jul2015) to Recorded 4. Fish Oil CAPS;  Therapy: (Recorded:11Jun2012) to Recorded 5. Florastor CAPS;  Therapy: (Recorded:30Jul2015) to Recorded 6. Memory Vite Oral Tablet;  Therapy: (Recorded:30Jul2015) to Recorded 7.  Montelukast Sodium 10 MG Oral Tablet;  Therapy: (Recorded:30Jul2015) to Recorded 8. Multi-Vitamin TABS;  Therapy: (Recorded:05Mar2014) to Recorded 9. Nadolol 20 MG Oral Tablet;  Therapy: 28Jun2013 to Recorded 10. Omega-3 CAPS;   Therapy: (Recorded:24Feb2012) to Recorded 11. Pantoprazole Sodium 40 MG Oral Tablet Delayed Release;   Therapy: [phone] to Recorded 12. Ramipril 5 MG Oral Capsule;   Therapy: (Recorded:24Feb2012) to Recorded 13. TraMADol HCl - 50 MG Oral Tablet;   Therapy: [id number] to Recorded 14. Vitamin B-12 TABS;   Therapy: (Recorded:30Jul2015) to Recorded 15. Vitamin D TABS;   Therapy: (Recorded:24Feb2012) to Recorded  Allergies Medication  1. Morphine Derivatives 2. Ceftin TABS 3. Codeine Derivatives 4. Penicillins 5. Phytonadione SOLN 6. Promethazine HCl TABS Non-Medication  7. Bee sting  Family History Problems  1. Family history of Family Health Status - Mother's Age   64 2. Family history of Father Deceased At Age ____   age 37  Social History Problems  1. Denied: History of Alcohol Use 2. Caffeine Use   3 daily 3. Former smoker (V15.82)   smoked 1 pack daily hasn't smoked in 30 year 4. Marital History - Currently Married 5. Denied: History of Tobacco Use  Past and social history reviewed and updated.   Review of Systems  Cardiovascular: no chest pain.  Respiratory: no shortness of breath.  Neurological: memory loss.    Vitals Vital Signs [Data Includes: Last 1 Day]  Recorded: 30Jul2015 03:21PM  Weight: 224 lb  BMI Calculated: 36.15 BSA Calculated: 2.1 Blood Pressure: 113 / 73 Temperature: 98 F Heart Rate: 49  Physical Exam Constitutional: Well nourished and well developed . No acute distress.  Pulmonary: No respiratory distress and normal respiratory rhythm and effort.  Cardiovascular: Heart rate and rhythm  are normal . No peripheral edema.    Results/Data  The following images/tracing/specimen were independently  visualized:  I have reviewed her recent perc placement films.    Assessment Assessed  1. Nephrolithiasis (592.0)  She has a 1.1cm left UPJ stone with recent obstruction and urosepsis but is doing well on Cipro with the perc.   Plan Nephrolithiasis  1. Start: Ciprofloxacin HCl - 250 MG Oral Tablet; TAKE 1 TABLET BID 2. Follow-up Schedule Surgery Office  Follow-up  Status: Hold For - Appointment   Requested for: [phone]  I am going to get her set up for a left PCNL and reviewed the risks of bleeding, infection, renal injury, urine leakage, thrombotic events and anesthetic complications as well as secondary procedures.  I will have her resume Cipro about 3 days prior to the procedure.   Discussion/Summary CC: Dr. Charleston Poot.

## 2014-05-04 ENCOUNTER — Ambulatory Visit (HOSPITAL_COMMUNITY): Payer: Medicare Other

## 2014-05-04 ENCOUNTER — Encounter (HOSPITAL_COMMUNITY): Payer: Self-pay | Admitting: *Deleted

## 2014-05-04 ENCOUNTER — Ambulatory Visit (HOSPITAL_COMMUNITY): Payer: Medicare Other | Admitting: Anesthesiology

## 2014-05-04 ENCOUNTER — Encounter (HOSPITAL_COMMUNITY): Payer: Medicare Other | Admitting: Anesthesiology

## 2014-05-04 ENCOUNTER — Observation Stay (HOSPITAL_COMMUNITY)
Admission: RE | Admit: 2014-05-04 | Discharge: 2014-05-05 | Disposition: A | Discharge status: Home / Self Care | Payer: Medicare Other | Source: Ambulatory Visit | Attending: Urology | Admitting: Urology

## 2014-05-04 ENCOUNTER — Encounter (HOSPITAL_COMMUNITY)
Admission: RE | Disposition: A | Discharge status: Home / Self Care | Payer: Self-pay | Source: Ambulatory Visit | Attending: Urology

## 2014-05-04 DIAGNOSIS — Z87891 Personal history of nicotine dependence: Secondary | ICD-10-CM | POA: Insufficient documentation

## 2014-05-04 DIAGNOSIS — K432 Incisional hernia without obstruction or gangrene: Secondary | ICD-10-CM | POA: Diagnosis not present

## 2014-05-04 DIAGNOSIS — Z885 Allergy status to narcotic agent status: Secondary | ICD-10-CM | POA: Diagnosis not present

## 2014-05-04 DIAGNOSIS — N200 Calculus of kidney: Secondary | ICD-10-CM | POA: Diagnosis not present

## 2014-05-04 DIAGNOSIS — Z888 Allergy status to other drugs, medicaments and biological substances status: Secondary | ICD-10-CM | POA: Diagnosis not present

## 2014-05-04 DIAGNOSIS — Z880 Allergy status to penicillin: Secondary | ICD-10-CM | POA: Diagnosis not present

## 2014-05-04 DIAGNOSIS — D3000 Benign neoplasm of unspecified kidney: Secondary | ICD-10-CM | POA: Insufficient documentation

## 2014-05-04 DIAGNOSIS — Z91038 Other insect allergy status: Secondary | ICD-10-CM | POA: Diagnosis not present

## 2014-05-04 DIAGNOSIS — N281 Cyst of kidney, acquired: Secondary | ICD-10-CM | POA: Insufficient documentation

## 2014-05-04 DIAGNOSIS — K2100 Gastro-esophageal reflux disease with esophagitis, without bleeding: Secondary | ICD-10-CM | POA: Diagnosis not present

## 2014-05-04 HISTORY — PX: NEPHROLITHOTOMY: SHX5134

## 2014-05-04 LAB — PREPARE PLATELET PHERESIS: Unit division: 0

## 2014-05-04 LAB — CBC
HCT: 35.9 % — ABNORMAL LOW (ref 36.0–46.0)
HEMOGLOBIN: 12.4 g/dL (ref 12.0–15.0)
MCH: 31.9 pg (ref 26.0–34.0)
MCHC: 34.5 g/dL (ref 30.0–36.0)
MCV: 92.3 fL (ref 78.0–100.0)
Platelets: 59 10*3/uL — ABNORMAL LOW (ref 150–400)
RBC: 3.89 MIL/uL (ref 3.87–5.11)
RDW: 15.3 % (ref 11.5–15.5)
WBC: 3.2 10*3/uL — ABNORMAL LOW (ref 4.0–10.5)

## 2014-05-04 LAB — HEMOGLOBIN AND HEMATOCRIT, BLOOD
HCT: 38 % (ref 36.0–46.0)
Hemoglobin: 13 g/dL (ref 12.0–15.0)

## 2014-05-04 LAB — PREPARE RBC (CROSSMATCH)

## 2014-05-04 SURGERY — NEPHROLITHOTOMY PERCUTANEOUS
Anesthesia: General | Laterality: Left

## 2014-05-04 MED ORDER — ONDANSETRON HCL 4 MG/2ML IJ SOLN
INTRAMUSCULAR | Status: DC | PRN
  Administered 2014-05-04: 4 mg via INTRAVENOUS

## 2014-05-04 MED ORDER — HYDROMORPHONE HCL PF 1 MG/ML IJ SOLN
0.5000 mg | INTRAMUSCULAR | Status: DC | PRN
  Administered 2014-05-04 (×2): 1 mg via INTRAVENOUS
  Filled 2014-05-04 (×2): qty 1

## 2014-05-04 MED ORDER — SUFENTANIL CITRATE 50 MCG/ML IV SOLN
INTRAVENOUS | Status: DC | PRN
  Administered 2014-05-04: 5 ug via INTRAVENOUS
  Administered 2014-05-04 (×2): 2.5 ug via INTRAVENOUS
  Administered 2014-05-04: 10 ug via INTRAVENOUS
  Administered 2014-05-04: 5 ug via INTRAVENOUS

## 2014-05-04 MED ORDER — SODIUM CHLORIDE 0.9 % IR SOLN
Status: DC | PRN
  Administered 2014-05-04: 3000 mL

## 2014-05-04 MED ORDER — CISATRACURIUM BESYLATE (PF) 10 MG/5ML IV SOLN
INTRAVENOUS | Status: DC | PRN
  Administered 2014-05-04: 8 mg via INTRAVENOUS
  Administered 2014-05-04: 2 mg via INTRAVENOUS
  Administered 2014-05-04: 4 mg via INTRAVENOUS

## 2014-05-04 MED ORDER — PROPOFOL 10 MG/ML IV BOLUS
INTRAVENOUS | Status: DC | PRN
  Administered 2014-05-04: 150 mg via INTRAVENOUS

## 2014-05-04 MED ORDER — EPHEDRINE SULFATE 50 MG/ML IJ SOLN
INTRAMUSCULAR | Status: AC
  Filled 2014-05-04: qty 1

## 2014-05-04 MED ORDER — ONDANSETRON HCL 4 MG/2ML IJ SOLN
INTRAMUSCULAR | Status: AC
  Filled 2014-05-04: qty 2

## 2014-05-04 MED ORDER — EPHEDRINE SULFATE 50 MG/ML IJ SOLN
INTRAMUSCULAR | Status: DC | PRN
  Administered 2014-05-04: 5 mg via INTRAVENOUS

## 2014-05-04 MED ORDER — CIPROFLOXACIN HCL 500 MG PO TABS
500.0000 mg | ORAL_TABLET | Freq: Two times a day (BID) | ORAL | Status: DC
  Administered 2014-05-04 – 2014-05-05 (×2): 500 mg via ORAL
  Filled 2014-05-04 (×4): qty 1

## 2014-05-04 MED ORDER — CITALOPRAM HYDROBROMIDE 20 MG PO TABS
20.0000 mg | ORAL_TABLET | Freq: Every morning | ORAL | Status: DC
  Administered 2014-05-04 – 2014-05-05 (×2): 20 mg via ORAL
  Filled 2014-05-04 (×2): qty 1

## 2014-05-04 MED ORDER — SUFENTANIL CITRATE 50 MCG/ML IV SOLN
INTRAVENOUS | Status: AC
  Filled 2014-05-04: qty 1

## 2014-05-04 MED ORDER — FENTANYL CITRATE 0.05 MG/ML IJ SOLN
INTRAMUSCULAR | Status: AC
  Filled 2014-05-04: qty 2

## 2014-05-04 MED ORDER — MIDAZOLAM HCL 2 MG/2ML IJ SOLN
INTRAMUSCULAR | Status: AC
  Filled 2014-05-04: qty 2

## 2014-05-04 MED ORDER — SODIUM CHLORIDE 0.9 % IJ SOLN
INTRAMUSCULAR | Status: AC
  Filled 2014-05-04: qty 10

## 2014-05-04 MED ORDER — TRAMADOL HCL 50 MG PO TABS
50.0000 mg | ORAL_TABLET | Freq: Four times a day (QID) | ORAL | Status: DC | PRN
  Administered 2014-05-05: 50 mg via ORAL
  Filled 2014-05-04: qty 1

## 2014-05-04 MED ORDER — ZOLPIDEM TARTRATE 5 MG PO TABS: 5.0000 mg | ORAL_TABLET | Freq: Every evening | ORAL | Status: DC | PRN

## 2014-05-04 MED ORDER — ONDANSETRON HCL 4 MG/2ML IJ SOLN: 4.0000 mg | INTRAMUSCULAR | Status: DC | PRN

## 2014-05-04 MED ORDER — ACETAMINOPHEN 325 MG PO TABS
650.0000 mg | ORAL_TABLET | ORAL | Status: DC | PRN
  Administered 2014-05-05: 650 mg via ORAL
  Filled 2014-05-04: qty 2

## 2014-05-04 MED ORDER — KCL IN DEXTROSE-NACL 20-5-0.45 MEQ/L-%-% IV SOLN
INTRAVENOUS | Status: DC
  Administered 2014-05-04: 18:00:00 via INTRAVENOUS
  Filled 2014-05-04 (×3): qty 1000

## 2014-05-04 MED ORDER — MONTELUKAST SODIUM 10 MG PO TABS
10.0000 mg | ORAL_TABLET | Freq: Every day | ORAL | Status: DC
  Administered 2014-05-04: 10 mg via ORAL
  Filled 2014-05-04 (×2): qty 1

## 2014-05-04 MED ORDER — LIDOCAINE HCL (CARDIAC) 20 MG/ML IV SOLN
INTRAVENOUS | Status: AC
  Filled 2014-05-04: qty 5

## 2014-05-04 MED ORDER — LIDOCAINE HCL (CARDIAC) 20 MG/ML IV SOLN
INTRAVENOUS | Status: DC | PRN
  Administered 2014-05-04: 50 mg via INTRAVENOUS

## 2014-05-04 MED ORDER — DEXAMETHASONE SODIUM PHOSPHATE 10 MG/ML IJ SOLN
INTRAMUSCULAR | Status: DC | PRN
  Administered 2014-05-04: 10 mg via INTRAVENOUS

## 2014-05-04 MED ORDER — NEOSTIGMINE METHYLSULFATE 10 MG/10ML IV SOLN
INTRAVENOUS | Status: DC | PRN
  Administered 2014-05-04: 4 mg via INTRAVENOUS

## 2014-05-04 MED ORDER — SUCCINYLCHOLINE CHLORIDE 20 MG/ML IJ SOLN
INTRAMUSCULAR | Status: DC | PRN
  Administered 2014-05-04: 100 mg via INTRAVENOUS

## 2014-05-04 MED ORDER — CIPROFLOXACIN IN D5W 400 MG/200ML IV SOLN
INTRAVENOUS | Status: AC
  Filled 2014-05-04: qty 200

## 2014-05-04 MED ORDER — LORATADINE 10 MG PO TABS
10.0000 mg | ORAL_TABLET | Freq: Every day | ORAL | Status: DC
  Administered 2014-05-04 – 2014-05-05 (×2): 10 mg via ORAL
  Filled 2014-05-04 (×2): qty 1

## 2014-05-04 MED ORDER — BISACODYL 10 MG RE SUPP: 10.0000 mg | Freq: Every day | RECTAL | Status: DC | PRN

## 2014-05-04 MED ORDER — FENTANYL CITRATE 0.05 MG/ML IJ SOLN
25.0000 ug | INTRAMUSCULAR | Status: AC | PRN
  Administered 2014-05-04 (×6): 25 ug via INTRAVENOUS

## 2014-05-04 MED ORDER — DOCUSATE SODIUM 100 MG PO CAPS
100.0000 mg | ORAL_CAPSULE | Freq: Two times a day (BID) | ORAL | Status: DC
  Administered 2014-05-04 – 2014-05-05 (×2): 100 mg via ORAL
  Filled 2014-05-04 (×4): qty 1

## 2014-05-04 MED ORDER — GLYCOPYRROLATE 0.2 MG/ML IJ SOLN
INTRAMUSCULAR | Status: AC
  Filled 2014-05-04: qty 3

## 2014-05-04 MED ORDER — GLYCOPYRROLATE 0.2 MG/ML IJ SOLN
INTRAMUSCULAR | Status: DC | PRN
  Administered 2014-05-04: 0.6 mg via INTRAVENOUS

## 2014-05-04 MED ORDER — LACTATED RINGERS IV SOLN
INTRAVENOUS | Status: DC
Start: 2014-05-04 — End: 2014-05-04
  Administered 2014-05-04: 12:00:00 via INTRAVENOUS
  Administered 2014-05-04: 1000 mL via INTRAVENOUS

## 2014-05-04 MED ORDER — MEPERIDINE HCL 50 MG/ML IJ SOLN: 6.2500 mg | INTRAMUSCULAR | Status: DC | PRN

## 2014-05-04 MED ORDER — IOHEXOL 300 MG/ML  SOLN
INTRAMUSCULAR | Status: DC | PRN
  Administered 2014-05-04: 20 mL via INTRAVENOUS

## 2014-05-04 MED ORDER — DEXAMETHASONE SODIUM PHOSPHATE 10 MG/ML IJ SOLN
INTRAMUSCULAR | Status: AC
  Filled 2014-05-04: qty 1

## 2014-05-04 MED ORDER — CIPROFLOXACIN IN D5W 400 MG/200ML IV SOLN: 400.0000 mg | INTRAVENOUS | Status: DC

## 2014-05-04 MED ORDER — PROPOFOL 10 MG/ML IV BOLUS
INTRAVENOUS | Status: AC
  Filled 2014-05-04: qty 20

## 2014-05-04 MED ORDER — RAMIPRIL 5 MG PO CAPS
5.0000 mg | ORAL_CAPSULE | Freq: Every morning | ORAL | Status: DC
  Administered 2014-05-04 – 2014-05-05 (×2): 5 mg via ORAL
  Filled 2014-05-04 (×2): qty 1

## 2014-05-04 MED ORDER — NEOSTIGMINE METHYLSULFATE 10 MG/10ML IV SOLN
INTRAVENOUS | Status: AC
  Filled 2014-05-04: qty 1

## 2014-05-04 MED ORDER — CISATRACURIUM BESYLATE 20 MG/10ML IV SOLN
INTRAVENOUS | Status: AC
Start: 2014-05-04 — End: 2014-05-04
  Filled 2014-05-04: qty 10

## 2014-05-04 SURGICAL SUPPLY — 54 items
APL SKNCLS STERI-STRIP NONHPOA (GAUZE/BANDAGES/DRESSINGS) ×2
BAG URINE DRAINAGE (UROLOGICAL SUPPLIES) ×3 IMPLANT
BAG URO CATCHER STRL LF (DRAPE) ×3 IMPLANT
BASKET ZERO TIP NITINOL 2.4FR (BASKET) ×1 IMPLANT
BENZOIN TINCTURE PRP APPL 2/3 (GAUZE/BANDAGES/DRESSINGS) ×6 IMPLANT
BLADE SURG 15 STRL LF DISP TIS (BLADE) ×2 IMPLANT
BLADE SURG 15 STRL SS (BLADE) ×3
BSKT STON RTRVL ZERO TP 2.4FR (BASKET)
CARTRIDGE STONEBREAK CO2 KIDNE (ELECTROSURGICAL) IMPLANT
CATCHER STONE W/TUBE ADAPTER (UROLOGICAL SUPPLIES) ×1 IMPLANT
CATH AINSWORTH 30CC 24FR (CATHETERS) ×5 IMPLANT
CATH KUMPE (CATHETERS) ×1
CATH ROBINSON RED A/P 20FR (CATHETERS) IMPLANT
CATH SLIP 5FR .038X65 KMP (CATHETERS) ×2
CATH SLIP 5FR 0.38 X 40 KMP (CATHETERS) ×1 IMPLANT
CATH URET 5FR 28IN OPEN ENDED (CATHETERS) IMPLANT
CATH URET DUAL LUMEN 6-10FR 50 (CATHETERS) ×5 IMPLANT
CATH X-FORCE N30 NEPHROSTOMY (TUBING) ×3 IMPLANT
COVER SURGICAL LIGHT HANDLE (MISCELLANEOUS) ×3 IMPLANT
DRAPE C-ARM 42X120 X-RAY (DRAPES) ×3 IMPLANT
DRAPE CAMERA CLOSED 9X96 (DRAPES) ×3 IMPLANT
DRAPE LINGEMAN PERC (DRAPES) ×3 IMPLANT
DRAPE SURG IRRIG POUCH 19X23 (DRAPES) ×3 IMPLANT
DRSG PAD ABDOMINAL 8X10 ST (GAUZE/BANDAGES/DRESSINGS) ×6 IMPLANT
DRSG TEGADERM 8X12 (GAUZE/BANDAGES/DRESSINGS) ×4 IMPLANT
FIBER LASER FLEXIVA 1000 (UROLOGICAL SUPPLIES) ×1 IMPLANT
FIBER LASER FLEXIVA 200 (UROLOGICAL SUPPLIES) ×1 IMPLANT
FIBER LASER FLEXIVA 365 (UROLOGICAL SUPPLIES) ×1 IMPLANT
FIBER LASER FLEXIVA 550 (UROLOGICAL SUPPLIES) IMPLANT
FIBER LASER TRAC TIP (UROLOGICAL SUPPLIES) ×2 IMPLANT
GAUZE SPONGE 4X4 12PLY STRL (GAUZE/BANDAGES/DRESSINGS) ×3 IMPLANT
GLOVE SURG SS PI 8.0 STRL IVOR (GLOVE) IMPLANT
GOWN STRL REUS W/TWL XL LVL3 (GOWN DISPOSABLE) ×3 IMPLANT
KIT BASIN OR (CUSTOM PROCEDURE TRAY) ×3 IMPLANT
MANIFOLD NEPTUNE II (INSTRUMENTS) ×3 IMPLANT
MASK EYE SHIELD (GAUZE/BANDAGES/DRESSINGS) ×3 IMPLANT
NS IRRIG 1000ML POUR BTL (IV SOLUTION) ×3 IMPLANT
PACK BASIC VI WITH GOWN DISP (CUSTOM PROCEDURE TRAY) ×3 IMPLANT
PACK CYSTO (CUSTOM PROCEDURE TRAY) ×3 IMPLANT
PAD ABD 8X10 STRL (GAUZE/BANDAGES/DRESSINGS) ×4 IMPLANT
PROBE KIDNEY STONEBRKR 2.0X425 (ELECTROSURGICAL) IMPLANT
PROBE LITHOCLAST ULTRA 3.8X403 (UROLOGICAL SUPPLIES) ×1 IMPLANT
PROBE PNEUMATIC 1.0MMX570MM (UROLOGICAL SUPPLIES) ×1 IMPLANT
SET IRRIG Y TYPE TUR BLADDER L (SET/KITS/TRAYS/PACK) ×3 IMPLANT
SPONGE LAP 4X18 X RAY DECT (DISPOSABLE) ×3 IMPLANT
STONE CATCHER W/TUBE ADAPTER (UROLOGICAL SUPPLIES) IMPLANT
SUT SILK 2 0 30  PSL (SUTURE) ×1
SUT SILK 2 0 30 PSL (SUTURE) ×2 IMPLANT
SYRINGE 12CC LL (MISCELLANEOUS) ×3 IMPLANT
SYRINGE 20CC LL (MISCELLANEOUS) ×6 IMPLANT
TOWEL OR 17X26 10 PK STRL BLUE (TOWEL DISPOSABLE) ×3 IMPLANT
TOWEL OR NON WOVEN STRL DISP B (DISPOSABLE) ×3 IMPLANT
TRAY FOLEY CATH 16FRSI W/METER (SET/KITS/TRAYS/PACK) ×1 IMPLANT
TUBING CONNECTING 10 (TUBING) ×7 IMPLANT

## 2014-05-04 NOTE — Transfer of Care (Signed)
Immediate Anesthesia Transfer of Care Note  Patient: [name]  Procedure(s) Performed: Procedure(s): LEFT PERCUTANEOUS NEPHROLITHOTOMY  (Left)  Patient Location: PACU  Anesthesia Type:General  Level of Consciousness: awake  Airway & Oxygen Therapy: Patient Spontanous Breathing and Patient connected to face mask oxygen  Post-op Assessment: Report given to PACU RN and Post -op Vital signs reviewed and stable  Post vital signs: Reviewed and stable  Complications: No apparent anesthesia complications

## 2014-05-04 NOTE — Brief Op Note (Signed)
05/04/2014  12:37 PM  PATIENT:  [name]  73 y.o. female  PRE-OPERATIVE DIAGNOSIS:  LEFT RENAL STONE 1.1 and 0.6cm    POST-OPERATIVE DIAGNOSIS:  left renal stone  PROCEDURE:  Procedure(s): LEFT PERCUTANEOUS NEPHROLITHOTOMY  (Left) HOLMIUM LASER APPLICATION (N/A)  SURGEON:  Surgeon(s) and Role:    * [name], MD - Primary  PHYSICIAN ASSISTANT:   ASSISTANTS: none   ANESTHESIA:   general  EBL:  Total I/O In: 1000 [I.V.:1000] Out: -   BLOOD ADMINISTERED:none  DRAINS: 49f nephrostomy tube and kompy catheter   LOCAL MEDICATIONS USED:  NONE  SPECIMEN:  Source of Specimen:  renal stones.   DISPOSITION OF SPECIMEN:  to family to bring to the  office for analysis.   COUNTS:  YES  TOURNIQUET:  * No tourniquets in log *  DICTATION: .Other Dictation: Dictation Number [id number] PLAN OF CARE: Discharge to home after PACU  PATIENT DISPOSITION:  PACU - hemodynamically stable.   Delay start of Pharmacological VTE agent (>24hrs) due to surgical blood loss or risk of bleeding: yes

## 2014-05-04 NOTE — Progress Notes (Signed)
Left nephrostomy tube draining clear amber urine. Dressing dry.

## 2014-05-04 NOTE — Interval H&P Note (Signed)
History and Physical Interval Note:  She had a Cipro po at 7am.    Her Plt count is 59K.   05/04/2014 10:31 AM  [name]  has presented today for surgery, with the diagnosis of LEFT RENAL STONE   The various methods of treatment have been discussed with the patient and family. After consideration of risks, benefits and other options for treatment, the patient has consented to  Procedure(s): LEFT PERCUTANEOUS NEPHROLITHOTOMY  (Left) HOLMIUM LASER APPLICATION (N/A) as a surgical intervention .  The patient's history has been reviewed, patient examined, no change in status, stable for surgery.  I have reviewed the patient's chart and labs.  Questions were answered to the patient's satisfaction.     Gwendolyne Welford J

## 2014-05-04 NOTE — Discharge Instructions (Signed)
Percutaneous Nephrostomy, Care After Refer to this sheet in the next few weeks. These instructions provide you with information on caring for yourself after your procedure. Your health care provider may also give you more specific instructions. Your treatment has been planned according to current medical practices, but problems sometimes occur. Call your health care provider if you have any problems or questions after your procedure. WHAT TO EXPECT AFTER THE PROCEDURE You will need to remain lying down for several hours. HOME CARE INSTRUCTIONS  Your nephrostomy tube is connected to a leg bag or bedside drainage bag. Always keep the tubing, the leg bag, or the bedside drainage bags below the level of the kidney so that the urine drains freely.  During the day, if you are connecting the nephrostomy tube to a leg bag, be sure there are no kinks in the tubing and that the urine is draining freely.  At night, you may want to connect the nephrostomy tube or the leg bag to a larger bedside drainage bag.  Change the dressing as often as directed by your health care provider, or if it becomes wet.  Gently remove the tapes and dressing from around the nephrostomy tube. Be careful not to pull on the tube while removing the dressing.  Wash the skin around the tube, rinse well, and dry.  Place two split drain sponges in and around the tube exit site.  Place tape around edge of the dressing.  Secure the nephrostomy tubing. Remember to make certain that the nephrostomy tube does not kink or become pinched closed. It can be useful to wrap any exposed tubing going from the nephrostomy tube to any of the connecting tubes to either the leg bag or drainage bag with an elastic bandage.  Every three weeks, replace the leg bag, drainage bag, and any extension tubing connected to your nephrostomy tube. Your health care provider will explain how to change the drainage bag and extension tubing. SEEK MEDICAL CARE  IF:  You experience any problems with any of the valves or tubing.  You have persistent pain or soreness in your back.  You have a fever or chills. SEEK IMMEDIATE MEDICAL CARE IF:  You have abdominal pain during the first week.  You have a new appearance of blood in your urine.  You have back pain that is not relieved by your medicine.  You have drainage, redness, swelling, or pain at the tube insertion site.  You have decreased urine output.  Your nephrostomy tube comes out. Document Released: 05/01/2004 Document Revised: 01/23/2014 Document Reviewed: 05/05/2013 [facility] Patient Information 2015 [place], [place]. This information is not intended to replace advice given to you by your health care provider. Make sure you discuss any questions you have with your health care provider.

## 2014-05-04 NOTE — Anesthesia Postprocedure Evaluation (Signed)
  Anesthesia Post-op Note  Patient: [name]  Procedure(s) Performed: Procedure(s) (LRB): LEFT PERCUTANEOUS NEPHROLITHOTOMY  (Left)  Patient Location: PACU  Anesthesia Type: General  Level of Consciousness: awake and alert   Airway and Oxygen Therapy: Patient Spontanous Breathing  Post-op Pain: mild  Post-op Assessment: Post-op Vital signs reviewed, Patient's Cardiovascular Status Stable, Respiratory Function Stable, Patent Airway and No signs of Nausea or vomiting  Last Vitals:  Filed Vitals:   05/04/14 1418  BP: 153/69  Pulse: 72  Temp: 36.8 C  Resp: 16    Post-op Vital Signs: stable   Complications: No apparent anesthesia complications

## 2014-05-04 NOTE — Progress Notes (Signed)
Patient ID: [name], female   DOB: 26-Nov-1940, 73 y.o.   MRN: [id number]  [name] is doing well postop without complaints.   Her Hgb is stable and the NT is draining somewhat bloody urine.    She will have a nephrostogram tomorrow and then can be discharged home with the tube in place.  She has f/u with me on 8/20 and I will remove the catheters then.

## 2014-05-04 NOTE — Op Note (Signed)
[name], [name]             ACCOUNT NO.:  [id number]  MEDICAL RECORD NO.:  [id number]  LOCATION:  4917                         FACILITY:  [facility]  PHYSICIAN:  Marshall Cork. Jeffie Pollock, M.D.    DATE OF BIRTH:  [birth year]  DATE OF PROCEDURE:  05/04/2014 DATE OF DISCHARGE:                              OPERATIVE REPORT   PROCEDURE:  Left percutaneous nephrolithotomy for less than 2 cm stone.  PREOPERATIVE DIAGNOSIS:  1.1 and 0.6 cm left renal stones.  POSTOPERATIVE DIAGNOSIS:  1.1 and 0.6 cm left renal stones.  SURGEON:  Marshall Cork. Jeffie Pollock, M.D.  ANESTHESIA:  General.  BLOOD LOSS:  Minimal.  COMPLICATIONS:  Renal pelvic perforation.  INDICATIONS:  Ms. Totman is a 73 year old white female with a history of bladder cancer and cystectomy with ileal conduit urinary diversion. She was recently admitted to the hospital with urosepsis and was found to have a 1.1-cm obstructing left UPJ stone.  She underwent placement of a percutaneous nephrostomy tube during that admission, returns now for removal of stone.  She also has a 6 mm lower pole stone and we will sent to remove both the stones.  FINDINGS OF PROCEDURE:  Her preoperative platelet count was 59,000.  She was typed and crossed for 2 units.  As a precaution, she had received oral Cipro this morning.  She was taken to the operating room where a general anesthetic was induced on the holding room stretcher.  Her urostomy bag was placed to a drainage bag.  She was then rolled prone on chest rolls with care taken to pad all pressure points.  She was fitted with PAS hose.  The nephrostomy tube site was then prepped with Betadine solution and she was draped in usual sterile fashion.  A guidewire was placed through the nephrostomy tube which was a loop nephrostomy in the renal pelvis around the stones.  Once the nephrostomy tube had been removed over the wire, an attempt was made to place a 12, 14, 25 cm ureteral access sheath as I wanted to  try to use the smaller scope possible avoid and to dilate the tract.  The tip of access sheath seemed to go superior as opposed to medially into the renal pelvis and at this point, the wire was removed and contrast was instilled which showed extravasation into the perinephric space of contrast.  At this point, I removed the inner core and advanced the digital ureteroscope through the access sheath because the tract had been mature, I was able to negotiate myself my way back into the renal pelvis.  Once in the renal pelvis, two stones were identified consistent with a stone seen on preoperative imaging.  A guidewire was then negotiated into the ureter using the ureteroscope and the ureteroscope was removed. A double-lumen catheter was then inserted over the wire and a second safety wire was placed.  A double-lumen catheter was removed and ureteroscopic access sheath was then reinserted over the working wire. The wire and the inner core were then removed and the digital ureteroscope was then inserted once again.  I can visualize the stones with the scope, but I could not angulate the scope sufficiently to apply the laser to  the to the stone surface.  A 200 micron trach master laser fiber then opened and passed through the scope, but despite efforts to treat the stone, this was unsuccessful. At this point, the ureteroscope was removed along with the access sheath.  The dual-lumen catheter was replaced over the safety wire, a second wire was then reinserted and the dual-lumen catheter was removed. The skin incision was opened for approximately 2 cm and a nephrostomy tract balloon dilation catheter was passed.  The balloon was inflated to 20 atmospheres with eventual disappearance of the waist and once the balloon removed, the access sheath was passed over the balloon into the renal pelvis.  The balloon was deflated and removed.  The working wire was left in place.  The rigid nephroscope  was then passed through the access sheath. The larger 2 stones was visualized and this was removed using a two- jawed grasper.  Then a second smaller stone was removed.  At this point, fluoroscopic imaging demonstrated what appeared to be another small stone in the lower pole of calyx that did not been readily apparent on the CT in preoperative film.  Attempts to access this calyx using the digital ureteroscope and the digital nephroscope were unsuccessful with visualizing the stone despite my feeling that the ureteroscope did get into the proper calyx at 1 point, it was felt that the calcification that was identified was very likely a Randall plaque. At this point, the nephrostomy sheath was repositioned in the renal pelvis and a 24-French Ainsworth catheter was passed through the nephrostomy sheath into the renal pelvis.  The sheath was backed out. The balloon was filled with 3 mL of sterile fluid.  A Kumpe catheter was then placed into the distal ureter on over the safety wire and the access sheath was then removed from the nephrostomy tube.  Once fluoroscopic position was confirmed and a nephrostogram revealed no extravasation, a 0 silk suture was used to secure the nephrostomy tube and safety catheter to the scan.  The drapes were removed.  A dressing was applied.  The nephrostomy catheter was placed to straight drainage the ureteral catheter was capped.  The patient was rolled back on the recovery room stretcher, her anesthetic was reversed.  She was moved to recovery room in stable condition.  There were no complications apart from the initial perforation in the collecting system.  No extravasation was identified on the subsequent imaging.     Marshall Cork. Jeffie Pollock, M.D.     JJW/MEDQ  D:  05/04/2014  T:  05/04/2014  Job:  [id number]

## 2014-05-04 NOTE — Anesthesia Preprocedure Evaluation (Addendum)
Anesthesia Evaluation  Patient identified by MRN, date of birth, ID band Patient awake    Reviewed: Allergy & Precautions, H&P , NPO status , Patient's Chart, lab work & pertinent test results  Airway Mallampati: II TM Distance: >3 FB Neck ROM: Full    Dental no notable dental hx.    Pulmonary former smoker,  breath sounds clear to auscultation  Pulmonary exam normal       Cardiovascular hypertension, Pt. on medications Rhythm:Regular Rate:Normal     Neuro/Psych dementia negative psych ROS   GI/Hepatic negative GI ROS, Neg liver ROS, (+) Cirrhosis -      ,   Endo/Other  negative endocrine ROS  Renal/GU negative Renal ROS  negative genitourinary   Musculoskeletal negative musculoskeletal ROS (+)   Abdominal   Peds negative pediatric ROS (+)  Hematology  (+) Blood dyscrasia, , Thrombocytopenia 59K today   Anesthesia Other Findings   Reproductive/Obstetrics negative OB ROS                         Anesthesia Physical Anesthesia Plan  ASA: III  Anesthesia Plan: General   Post-op Pain Management:    Induction: Intravenous  Airway Management Planned: Oral ETT  Additional Equipment:   Intra-op Plan:   Post-operative Plan: Extubation in OR  Informed Consent: I have reviewed the patients History and Physical, chart, labs and discussed the procedure including the risks, benefits and alternatives for the proposed anesthesia with the patient or authorized representative who has indicated his/her understanding and acceptance.   Dental advisory given  Plan Discussed with: CRNA  Anesthesia Plan Comments: (Will have 2 units prbc and plts available.)        Anesthesia Quick Evaluation

## 2014-05-04 NOTE — Anesthesia Procedure Notes (Signed)
Procedure Name: Intubation Date/Time: 05/04/2014 11:07 AM Performed by: Danley Danker L Patient Re-evaluated:Patient Re-evaluated prior to inductionOxygen Delivery Method: Circle system utilized Preoxygenation: Pre-oxygenation with 100% oxygen Intubation Type: IV induction Ventilation: Mask ventilation without difficulty and Oral airway inserted - appropriate to patient size Laryngoscope Size: Miller and 3 Grade View: Grade I Tube type: Oral Tube size: 7.5 mm Number of attempts: 1 Airway Equipment and Method: Stylet Placement Confirmation: ETT inserted through vocal cords under direct vision,  breath sounds checked- equal and bilateral and positive ETCO2 Secured at: 21 cm Tube secured with: Tape Dental Injury: Teeth and Oropharynx as per pre-operative assessment

## 2014-05-05 ENCOUNTER — Observation Stay (HOSPITAL_COMMUNITY): Payer: Medicare Other

## 2014-05-05 ENCOUNTER — Encounter (HOSPITAL_COMMUNITY): Payer: Self-pay | Admitting: Urology

## 2014-05-05 DIAGNOSIS — N200 Calculus of kidney: Secondary | ICD-10-CM | POA: Diagnosis not present

## 2014-05-05 DIAGNOSIS — Z48816 Encounter for surgical aftercare following surgery on the genitourinary system: Secondary | ICD-10-CM | POA: Diagnosis not present

## 2014-05-05 DIAGNOSIS — S3710XA Unspecified injury of ureter, initial encounter: Secondary | ICD-10-CM | POA: Diagnosis not present

## 2014-05-05 DIAGNOSIS — N1330 Unspecified hydronephrosis: Secondary | ICD-10-CM | POA: Diagnosis not present

## 2014-05-05 LAB — BASIC METABOLIC PANEL
Anion gap: 11 (ref 5–15)
BUN: 12 mg/dL (ref 6–23)
CO2: 25 mEq/L (ref 19–32)
CREATININE: 0.56 mg/dL (ref 0.50–1.10)
Calcium: 8.9 mg/dL (ref 8.4–10.5)
Chloride: 106 mEq/L (ref 96–112)
GFR calc Af Amer: >90 mL/min (ref >90)
GFR calc non Af Amer: 90 mL/min — ABNORMAL LOW (ref >90)
GLUCOSE: 165 mg/dL — AB (ref 70–99)
Potassium: 4.2 mEq/L (ref 3.7–5.3)
SODIUM: 142 meq/L (ref 137–147)

## 2014-05-05 LAB — CBC
HEMATOCRIT: 33.7 % — AB (ref 36.0–46.0)
Hemoglobin: 11.4 g/dL — ABNORMAL LOW (ref 12.0–15.0)
MCH: 31.7 pg (ref 26.0–34.0)
MCHC: 33.8 g/dL (ref 30.0–36.0)
MCV: 93.6 fL (ref 78.0–100.0)
Platelets: 47 10*3/uL — ABNORMAL LOW (ref 150–400)
RBC: 3.6 MIL/uL — ABNORMAL LOW (ref 3.87–5.11)
RDW: 15 % (ref 11.5–15.5)
WBC: 3.4 10*3/uL — AB (ref 4.0–10.5)

## 2014-05-05 MED ORDER — IOHEXOL 300 MG/ML  SOLN
10.0000 mL | Freq: Once | INTRAMUSCULAR | Status: AC | PRN
  Administered 2014-05-05: 15 mL

## 2014-05-05 NOTE — Procedures (Signed)
Left nephrostogram neg for extravasation  Left ureter patent. No hydro

## 2014-05-05 NOTE — Progress Notes (Signed)
IV d/c'd. Drainage bag to nephrostomy changed to leg bag with strap. D/C instructions discussed with pt's husband, who verbalized understanding of medications, instructions, and follow-up appointments. Pt d/c'd from unit and left via wheelchair. Dominga Ferry, RN

## 2014-05-05 NOTE — Discharge Summary (Signed)
Date of admission: 05/04/2014  Date of discharge: 05/05/2014  Admission diagnosis: Nephrolithiasis  Discharge diagnosis: same  Secondary diagnoses: none  History and Physical: For full details, please see admission history and physical. Briefly, [name] is a 73 y.o. year old patient with ileal conduit and recent urosepsis for ureteral stone. She underwent PCN placement and is here for definitive stone treatment   Hospital Course: She underwent PCNL through existing PCN tract on 8/13. The stones were removed. There was a small perforation in the renal pelvis. Postoperatively she did well. On POD#1 a nephrostogram was performed which showed good antegrade drainage to conduit, no extravasation at kidney, and tubes in good position.  She was discharged on POD#1 with PCN to drain to allow renal pelvis healing.  She indicated that she had adequate pain meds and abx to take until her follow up on 8/20.  Laboratory values:  Recent Labs  05/04/14 0853 05/04/14 1318 05/05/14 0512  HGB 12.4 13.0 11.4*  HCT 35.9* 38.0 33.7*    Recent Labs  05/05/14 0512  CREATININE 0.56    Disposition: Home  Discharge instruction: The patient was instructed to be ambulatory but told to refrain from heavy lifting, strenuous activity, or driving.  Discharge medications:    Medication List    ASK your doctor about these medications       acetaminophen 500 MG tablet  Commonly known as:  TYLENOL  Take 1,000 mg by mouth every 6 (six) hours as needed for mild pain.     Cinnamon 500 MG capsule  Take 500 mg by mouth daily.     ciprofloxacin 500 MG tablet  Commonly known as:  CIPRO  Take 500 mg by mouth 2 (two) times daily. To start taking 2 days before surgery     citalopram 40 MG tablet  Commonly known as:  CELEXA  Take 20 mg by mouth every morning.     fexofenadine 180 MG tablet  Commonly known as:  ALLEGRA  Take 180 mg by mouth daily.     fish oil-omega-3 fatty acids 1000 MG  capsule  Take 1 g by mouth daily.     MEMORY VITE Tabs  Take 1 tablet by mouth daily.     montelukast 10 MG tablet  Commonly known as:  SINGULAIR  Take 10 mg by mouth at bedtime.     multivitamin with minerals Tabs tablet  Take 1 tablet by mouth daily.     ramipril 5 MG capsule  Commonly known as:  ALTACE  Take 5 mg by mouth every morning.     saccharomyces boulardii 250 MG capsule  Commonly known as:  FLORASTOR  Take 250 mg by mouth every morning.     traMADol 50 MG tablet  Commonly known as:  ULTRAM  Take 1 tablet (50 mg total) by mouth every 6 (six) hours as needed for moderate pain.     VITAMIN B 12 PO  Take 1 tablet by mouth every morning.     Vitamin D3 1000 UNITS Caps  Take 1 capsule by mouth daily.        Followup:      Follow-up Information   Follow up with [name], MD On 05/11/2014. (as scheduled. )    Specialty:  Urology   Contact information:   [address] [place] [address] [phone]

## 2014-05-06 DIAGNOSIS — K7460 Unspecified cirrhosis of liver: Secondary | ICD-10-CM | POA: Diagnosis not present

## 2014-05-06 DIAGNOSIS — N10 Acute tubulo-interstitial nephritis: Secondary | ICD-10-CM | POA: Diagnosis not present

## 2014-05-06 DIAGNOSIS — L989 Disorder of the skin and subcutaneous tissue, unspecified: Secondary | ICD-10-CM | POA: Diagnosis not present

## 2014-05-06 DIAGNOSIS — F0390 Unspecified dementia without behavioral disturbance: Secondary | ICD-10-CM | POA: Diagnosis not present

## 2014-05-06 DIAGNOSIS — Z8551 Personal history of malignant neoplasm of bladder: Secondary | ICD-10-CM | POA: Diagnosis not present

## 2014-05-06 DIAGNOSIS — Z436 Encounter for attention to other artificial openings of urinary tract: Secondary | ICD-10-CM | POA: Diagnosis not present

## 2014-05-08 DIAGNOSIS — F0390 Unspecified dementia without behavioral disturbance: Secondary | ICD-10-CM | POA: Diagnosis not present

## 2014-05-08 DIAGNOSIS — L989 Disorder of the skin and subcutaneous tissue, unspecified: Secondary | ICD-10-CM | POA: Diagnosis not present

## 2014-05-08 DIAGNOSIS — K7460 Unspecified cirrhosis of liver: Secondary | ICD-10-CM | POA: Diagnosis not present

## 2014-05-08 DIAGNOSIS — Z8551 Personal history of malignant neoplasm of bladder: Secondary | ICD-10-CM | POA: Diagnosis not present

## 2014-05-08 DIAGNOSIS — Z436 Encounter for attention to other artificial openings of urinary tract: Secondary | ICD-10-CM | POA: Diagnosis not present

## 2014-05-08 DIAGNOSIS — N10 Acute tubulo-interstitial nephritis: Secondary | ICD-10-CM | POA: Diagnosis not present

## 2014-05-08 LAB — TYPE AND SCREEN
ABO/RH(D): A POS
ANTIBODY SCREEN: NEGATIVE
UNIT DIVISION: 0
Unit division: 0

## 2014-05-10 DIAGNOSIS — K7460 Unspecified cirrhosis of liver: Secondary | ICD-10-CM | POA: Diagnosis not present

## 2014-05-10 DIAGNOSIS — F0390 Unspecified dementia without behavioral disturbance: Secondary | ICD-10-CM | POA: Diagnosis not present

## 2014-05-10 DIAGNOSIS — Z8551 Personal history of malignant neoplasm of bladder: Secondary | ICD-10-CM | POA: Diagnosis not present

## 2014-05-10 DIAGNOSIS — N10 Acute tubulo-interstitial nephritis: Secondary | ICD-10-CM | POA: Diagnosis not present

## 2014-05-10 DIAGNOSIS — Z436 Encounter for attention to other artificial openings of urinary tract: Secondary | ICD-10-CM | POA: Diagnosis not present

## 2014-05-10 DIAGNOSIS — L989 Disorder of the skin and subcutaneous tissue, unspecified: Secondary | ICD-10-CM | POA: Diagnosis not present

## 2014-05-16 DIAGNOSIS — Z8551 Personal history of malignant neoplasm of bladder: Secondary | ICD-10-CM | POA: Diagnosis not present

## 2014-05-16 DIAGNOSIS — F0390 Unspecified dementia without behavioral disturbance: Secondary | ICD-10-CM | POA: Diagnosis not present

## 2014-05-16 DIAGNOSIS — L989 Disorder of the skin and subcutaneous tissue, unspecified: Secondary | ICD-10-CM | POA: Diagnosis not present

## 2014-05-16 DIAGNOSIS — N10 Acute tubulo-interstitial nephritis: Secondary | ICD-10-CM | POA: Diagnosis not present

## 2014-05-16 DIAGNOSIS — K7460 Unspecified cirrhosis of liver: Secondary | ICD-10-CM | POA: Diagnosis not present

## 2014-05-16 DIAGNOSIS — Z436 Encounter for attention to other artificial openings of urinary tract: Secondary | ICD-10-CM | POA: Diagnosis not present

## 2014-05-22 ENCOUNTER — Ambulatory Visit (HOSPITAL_BASED_OUTPATIENT_CLINIC_OR_DEPARTMENT_OTHER): Payer: Medicare Other | Admitting: Hematology & Oncology

## 2014-05-22 ENCOUNTER — Encounter: Payer: Self-pay | Admitting: Hematology & Oncology

## 2014-05-22 ENCOUNTER — Other Ambulatory Visit (HOSPITAL_BASED_OUTPATIENT_CLINIC_OR_DEPARTMENT_OTHER): Payer: Medicare Other | Admitting: Lab

## 2014-05-22 VITALS — BP 152/74 | HR 57 | Temp 97.0°F | Resp 16 | Ht 65.0 in | Wt 217.0 lb

## 2014-05-22 DIAGNOSIS — D509 Iron deficiency anemia, unspecified: Secondary | ICD-10-CM

## 2014-05-22 DIAGNOSIS — D696 Thrombocytopenia, unspecified: Secondary | ICD-10-CM | POA: Diagnosis not present

## 2014-05-22 LAB — CBC WITH DIFFERENTIAL (CANCER CENTER ONLY)
BASO#: 0 10*3/uL (ref 0.0–0.2)
BASO%: 0.9 % (ref 0.0–2.0)
EOS ABS: 0.1 10*3/uL (ref 0.0–0.5)
EOS%: 2.3 % (ref 0.0–7.0)
HCT: 37.2 % (ref 34.8–46.6)
HEMOGLOBIN: 12.8 g/dL (ref 11.6–15.9)
LYMPH#: 0.7 10*3/uL — ABNORMAL LOW (ref 0.9–3.3)
LYMPH%: 20.2 % (ref 14.0–48.0)
MCH: 32.4 pg (ref 26.0–34.0)
MCHC: 34.4 g/dL (ref 32.0–36.0)
MCV: 94 fL (ref 81–101)
MONO#: 0.4 10*3/uL (ref 0.1–0.9)
MONO%: 12 % (ref 0.0–13.0)
NEUT%: 64.6 % (ref 39.6–80.0)
NEUTROS ABS: 2.2 10*3/uL (ref 1.5–6.5)
Platelets: 76 10*3/uL — ABNORMAL LOW (ref 145–400)
RBC: 3.95 10*6/uL (ref 3.70–5.32)
RDW: 14.9 % (ref 11.1–15.7)
WBC: 3.4 10*3/uL — ABNORMAL LOW (ref 3.9–10.0)

## 2014-05-22 LAB — FERRITIN CHCC: FERRITIN: 167 ng/mL (ref 9–269)

## 2014-05-22 LAB — RETICULOCYTES (CHCC)
ABS Retic: 79.2 10*3/uL (ref 19.0–186.0)
RBC.: 3.96 MIL/uL (ref 3.87–5.11)
Retic Ct Pct: 2 % (ref 0.4–2.3)

## 2014-05-22 LAB — IRON AND TIBC CHCC
%SAT: 49 % (ref 21–57)
IRON: 92 ug/dL (ref 41–142)
TIBC: 188 ug/dL — AB (ref 236–444)
UIBC: 95 ug/dL — ABNORMAL LOW (ref 120–384)

## 2014-05-22 NOTE — Progress Notes (Signed)
Hematology and Oncology Follow Up Visit  [name] [id number] 1940/11/07 73 y.o. 05/22/2014   Principle Diagnosis:  1. Recurrent iron-deficiency anemia. 2. 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 3. Remote history of stage II (T3, N0, M0) adenocarcinoma of colon. 4. 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     Interim History:  Ms.  Robertson is back for followup. I assisting her in the hospital. She was in with recurrent urinary tract infections. She's found to have kidney stones. She had kidney stones over on the left kidney. She had a percutaneous nephrostomy tube placed. The kidney stones were extracted through this.  She's doing well. She has no tubes in her right now.. She has a really bad dementia. She is a pleasant. She's been doing good otherwise.  Her appetite has been good. She had no bleeding. She had no nausea vomiting. She's had no fever.  Medications: Current outpatient prescriptions:acetaminophen (TYLENOL) 500 MG tablet, Take 1,000 mg by mouth every 6 (six) hours as needed for mild pain., Disp: , Rfl: ;  Cholecalciferol (VITAMIN D3) 1000 UNITS CAPS, Take 1 capsule by mouth daily.  , Disp: , Rfl: ;  Cinnamon 500 MG capsule, Take 500 mg by mouth daily. , Disp: , Rfl: ;  citalopram (CELEXA) 40 MG tablet, Take 20 mg by mouth every morning. , Disp: , Rfl:  Cyanocobalamin (VITAMIN B 12 PO), Take 1 tablet by mouth every morning. , Disp: , Rfl: ;  fexofenadine (ALLEGRA) 180 MG tablet, Take 180 mg by mouth daily., Disp: , Rfl: ;  fish oil-omega-3 fatty acids 1000 MG capsule, Take 1 g by mouth daily. , Disp: , Rfl: ;  montelukast (SINGULAIR) 10 MG tablet, Take 10 mg by mouth at bedtime., Disp: , Rfl: ;  Multiple Vitamin (MULTIVITAMIN WITH MINERALS) TABS tablet, Take 1 tablet by mouth daily., Disp: , Rfl:  Multiple Vitamins-Minerals (MEMORY VITE) TABS, Take 1 tablet by  mouth daily., Disp: , Rfl: ;  ramipril (ALTACE) 5 MG capsule, Take 5 mg by mouth every morning. , Disp: , Rfl: ;  saccharomyces boulardii (FLORASTOR) 250 MG capsule, Take 250 mg by mouth every morning., Disp: , Rfl:   Allergies:  Allergies  Allergen Reactions  . Codeine Swelling and Rash    Swelling in throat  . Morphine And Related Swelling and Rash  . Other Anaphylaxis    Bee Stings  . Phytonadione Other (See Comments)    Patient experienced episode of cyanosis following dose of Vitamin K 10 mg IV.  . Meat Extract     Patient doesn't eat meat   . Promethazine Hcl     Per MD chart - 08/08/11  . Penicillins Rash    Past Medical History, Surgical history, Social history, and Family History were reviewed and updated.  Review of Systems: As above  Physical Exam:  height is 5' 5"  (1.651 m) and weight is 217 lb (98.431 kg). Her oral temperature is 97 F (36.1 C). Her blood pressure is 152/74 and her pulse is 57. Her respiration is 16.   Lungs are clear. Cardiac exam regular rate and rhythm. She has no murmurs rubs or bruits. Abdomen is soft. Has good bowel sounds. There is no fluid wave. There is a palpable liver or spleen tip. Extremities shows no clubbing, cyanosis or edema. Back exam does show a dressing in the left flank. This is where she  had her nephrostomy tube in. Hopefully, this will be removed soon. Neurological exam shows no focal neurological deficits.  Lab Results  Component Value Date   WBC 3.4* 05/22/2014   HGB 12.8 05/22/2014   HCT 37.2 05/22/2014   MCV 94 05/22/2014   PLT 76* 05/22/2014     Chemistry      Component Value Date/Time   NA 142 05/05/2014 0512   NA 143 07/01/2010 0949   K 4.2 05/05/2014 0512   K 4.2 07/01/2010 0949   CL 106 05/05/2014 0512   CL 99 07/01/2010 0949   CO2 25 05/05/2014 0512   CO2 33 07/01/2010 0949   BUN 12 05/05/2014 0512   BUN 13 07/01/2010 0949   CREATININE 0.56 05/05/2014 0512   CREATININE 0.7 07/01/2010 0949      Component Value  Date/Time   CALCIUM 8.9 05/05/2014 0512   CALCIUM 9.4 07/01/2010 0949   ALKPHOS 91 04/06/2014 1743   ALKPHOS 87* 07/01/2010 0949   AST 62* 04/06/2014 1743   AST 24 07/01/2010 0949   ALT 24 04/06/2014 1743   ALT 19 07/01/2010 0949   BILITOT 4.4* 04/06/2014 1743   BILITOT 1.60 07/01/2010 0949         Impression and Plan: [name] is 73 year old female. She has iron deficiency anemia. She's doing real well from my point of view. I would think that her chance of being iron deficient right now is quite low given her MCV being so good.  We will go ahead and plan to get her back in 3 months time now.  I am glad that they got the kidney stone issues taken care of. This was a big problem for her.   [name], MD 8/31/20151:34 PM

## 2014-05-23 ENCOUNTER — Telehealth: Payer: Self-pay | Admitting: *Deleted

## 2014-05-23 NOTE — Telephone Encounter (Addendum)
Message copied by Orlando Penner on Tue May 23, 2014  3:49 PM ------      Message from: [name]      Created: Tue May 23, 2014  7:48 AM       Call husband - iron is ok! [name] ------This message given to pts husband

## 2014-05-24 DIAGNOSIS — F0390 Unspecified dementia without behavioral disturbance: Secondary | ICD-10-CM | POA: Diagnosis not present

## 2014-05-24 DIAGNOSIS — L989 Disorder of the skin and subcutaneous tissue, unspecified: Secondary | ICD-10-CM | POA: Diagnosis not present

## 2014-05-24 DIAGNOSIS — N10 Acute tubulo-interstitial nephritis: Secondary | ICD-10-CM | POA: Diagnosis not present

## 2014-05-24 DIAGNOSIS — K7460 Unspecified cirrhosis of liver: Secondary | ICD-10-CM | POA: Diagnosis not present

## 2014-05-24 DIAGNOSIS — Z8551 Personal history of malignant neoplasm of bladder: Secondary | ICD-10-CM | POA: Diagnosis not present

## 2014-05-24 DIAGNOSIS — Z436 Encounter for attention to other artificial openings of urinary tract: Secondary | ICD-10-CM | POA: Diagnosis not present

## 2014-06-02 DIAGNOSIS — L989 Disorder of the skin and subcutaneous tissue, unspecified: Secondary | ICD-10-CM | POA: Diagnosis not present

## 2014-06-02 DIAGNOSIS — Z8551 Personal history of malignant neoplasm of bladder: Secondary | ICD-10-CM | POA: Diagnosis not present

## 2014-06-02 DIAGNOSIS — N10 Acute tubulo-interstitial nephritis: Secondary | ICD-10-CM | POA: Diagnosis not present

## 2014-06-02 DIAGNOSIS — F0390 Unspecified dementia without behavioral disturbance: Secondary | ICD-10-CM | POA: Diagnosis not present

## 2014-06-02 DIAGNOSIS — Z436 Encounter for attention to other artificial openings of urinary tract: Secondary | ICD-10-CM | POA: Diagnosis not present

## 2014-06-02 DIAGNOSIS — K7460 Unspecified cirrhosis of liver: Secondary | ICD-10-CM | POA: Diagnosis not present

## 2014-07-05 DIAGNOSIS — I10 Essential (primary) hypertension: Secondary | ICD-10-CM | POA: Diagnosis not present

## 2014-07-05 DIAGNOSIS — F329 Major depressive disorder, single episode, unspecified: Secondary | ICD-10-CM | POA: Diagnosis not present

## 2014-07-05 DIAGNOSIS — Z23 Encounter for immunization: Secondary | ICD-10-CM | POA: Diagnosis not present

## 2014-07-05 DIAGNOSIS — H9202 Otalgia, left ear: Secondary | ICD-10-CM | POA: Diagnosis not present

## 2014-07-18 DIAGNOSIS — K7460 Unspecified cirrhosis of liver: Secondary | ICD-10-CM | POA: Diagnosis not present

## 2014-08-24 ENCOUNTER — Encounter: Payer: Self-pay | Admitting: Family

## 2014-08-24 ENCOUNTER — Other Ambulatory Visit (HOSPITAL_BASED_OUTPATIENT_CLINIC_OR_DEPARTMENT_OTHER): Payer: Medicare Other | Admitting: Lab

## 2014-08-24 ENCOUNTER — Ambulatory Visit (HOSPITAL_BASED_OUTPATIENT_CLINIC_OR_DEPARTMENT_OTHER): Payer: Medicare Other | Admitting: Family

## 2014-08-24 DIAGNOSIS — D509 Iron deficiency anemia, unspecified: Secondary | ICD-10-CM | POA: Diagnosis not present

## 2014-08-24 DIAGNOSIS — R238 Other skin changes: Secondary | ICD-10-CM | POA: Diagnosis not present

## 2014-08-24 LAB — IRON AND TIBC CHCC
%SAT: 58 % — AB (ref 21–57)
Iron: 120 ug/dL (ref 41–142)
TIBC: 205 ug/dL — ABNORMAL LOW (ref 236–444)
UIBC: 85 ug/dL — ABNORMAL LOW (ref 120–384)

## 2014-08-24 LAB — CBC WITH DIFFERENTIAL (CANCER CENTER ONLY)
BASO#: 0 10*3/uL (ref 0.0–0.2)
BASO%: 0.6 % (ref 0.0–2.0)
EOS%: 2 % (ref 0.0–7.0)
Eosinophils Absolute: 0.1 10*3/uL (ref 0.0–0.5)
HEMATOCRIT: 36.7 % (ref 34.8–46.6)
HGB: 12.5 g/dL (ref 11.6–15.9)
LYMPH#: 0.7 10*3/uL — ABNORMAL LOW (ref 0.9–3.3)
LYMPH%: 19.6 % (ref 14.0–48.0)
MCH: 32.2 pg (ref 26.0–34.0)
MCHC: 34.1 g/dL (ref 32.0–36.0)
MCV: 95 fL (ref 81–101)
MONO#: 0.4 10*3/uL (ref 0.1–0.9)
MONO%: 11.2 % (ref 0.0–13.0)
NEUT#: 2.3 10*3/uL (ref 1.5–6.5)
NEUT%: 66.6 % (ref 39.6–80.0)
Platelets: 63 10*3/uL — ABNORMAL LOW (ref 145–400)
RBC: 3.88 10*6/uL (ref 3.70–5.32)
RDW: 15.4 % (ref 11.1–15.7)
WBC: 3.5 10*3/uL — ABNORMAL LOW (ref 3.9–10.0)

## 2014-08-24 LAB — RETICULOCYTES (CHCC)
ABS RETIC: 86.2 10*3/uL (ref 19.0–186.0)
RBC.: 3.92 MIL/uL (ref 3.87–5.11)
Retic Ct Pct: 2.2 % (ref 0.4–2.3)

## 2014-08-24 LAB — FERRITIN CHCC: Ferritin: 87 ng/ml (ref 9–269)

## 2014-08-24 MED ORDER — DOXYCYCLINE HYCLATE 100 MG PO TABS: 100.0000 mg | ORAL_TABLET | Freq: Two times a day (BID) | ORAL | Status: DC

## 2014-08-24 NOTE — Progress Notes (Signed)
[facility]  Telephone:(336) [phone] Fax:(336) [phone]  ID: [name] OB: [birth year] MR#: [id number]#:[id number] Patient Care Team: Florina Ou, MD as PCP - General (Family Medicine) Bethann Goo Himmelrich, RD as Dietitian  DIAGNOSIS: 1. Recurrent iron-deficiency anemia. 2. 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 3. Remote history of stage II (T3, N0, M0) adenocarcinoma of colon. 4. 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 Her UTI has cleared up and she is feeling much better. She does have a red area on her lower mid abdomen that is warm to the tough. I'm not sure if it might be an abscess. Her husband said this has happened before and that her PCP treated it with antibiotics.  She denies fever, chills, n/v, cough, rash, headache, dizziness, SOB, chest pain, palpitations, abdominal pain, constipation, diarrhea, blood in urine or stool. No bleeding or pain.  No swelling, tenderness, numbness or tingling in her extremities.  Her appetite is good and she is staying hydrated. Her weight is stable.   CURRENT TREATMENT: IV iron as indicated  REVIEW OF SYSTEMS: All other 10 point review of systems is negative.   PAST MEDICAL HISTORY: Past Medical History  Diagnosis Date  . Cirrhosis 08/26/2011  . Colon cancer 2001  . Bladder cancer 2003  . Memory loss     d/t chemotherapy;short and long term;takes Aricept daily  . Thrombocytopenia   . Osteoporosis     takes Vit D3 daily  . Blood transfusion   . Arthritis   . Complication of anesthesia     confusion x 2 to 3 days  . Hypertension     takes Ramipril and Metoprolol daily  . History of blood clots 50+yrs ago    legs when she was pregnancy  . Headache(784.0)     occasionally  . Joint pain   . Joint swelling   . GERD (gastroesophageal reflux disease)     takes Protonix daily  .  History of GI bleed   . Constipation   . Hemorrhoids   . History of colon polyps   . Anemia, iron deficiency     iron injection about 6wks ago  . Cataract immature   . Depression     takes Celexa daily  . Attention to urostomy     urostomy d/t hx bladder cancer  . Blood dyscrasia     thromboctopenia  . History of colon cancer   . History of kidney stones   . Hx of transfusion of whole blood     PAST SURGICAL HISTORY: Past Surgical History  Procedure Laterality Date  . Colon surgery  2004  . Total knee arthroplasty  2010    Left knee  . Colon surgery  2001  . Bladder surgery      bladder removed d/t cancer  . Esophagogastroduodenoscopy  02/27/2012    Procedure: ESOPHAGOGASTRODUODENOSCOPY (EGD);  Surgeon: [name], MD;  Location: [name];  Service: Endoscopy;  Laterality: N/A;  bedside case  . Appendectomy    . Abdominal hysterectomy    . Cholecystectomy    . Colonoscopy    . Colon resection      with colostomy then colostomy reversal  . Nephrolithotomy Left 05/04/2014    Procedure: LEFT PERCUTANEOUS NEPHROLITHOTOMY ;  Surgeon: [name], MD;  Location: [facility];  Service: Urology;  Laterality: Left;    FAMILY HISTORY Family History  Problem Relation  Age of Onset  . Diabetes Brother   . Cancer Other   . Hypertension Other     GYNECOLOGIC HISTORY:  No LMP recorded. Patient has had a hysterectomy.   SOCIAL HISTORY: History   Social History  . Marital Status: Married    Spouse Name: N/A    Number of Children: N/A  . Years of Education: N/A   Occupational History  . Not on file.   Social History Main Topics  . Smoking status: Former Smoker -- 0.50 packs/day for 35 years    Types: Cigarettes    Start date: 10/25/1955    Quit date: 09/22/1991  . Smokeless tobacco: Never Used     Comment: quit 23years ago  . Alcohol Use: No  . Drug Use: No  . Sexual Activity: Yes    Birth Control/ Protection: Surgical   Other Topics Concern  . Not on file    Social History Narrative   Lives at home with her husband, ambulatory    ADVANCED DIRECTIVES:  <no information>  HEALTH MAINTENANCE: History  Substance Use Topics  . Smoking status: Former Smoker -- 0.50 packs/day for 35 years    Types: Cigarettes    Start date: 10/25/1955    Quit date: 09/22/1991  . Smokeless tobacco: Never Used     Comment: quit 23years ago  . Alcohol Use: No   Colonoscopy: PAP: Bone density: Lipid panel:  Allergies  Allergen Reactions  . Codeine Swelling and Rash    Swelling in throat  . Morphine And Related Swelling and Rash  . Other Anaphylaxis    Bee Stings  . Phytonadione Other (See Comments)    Patient experienced episode of cyanosis following dose of Vitamin K 10 mg IV.  . Meat Extract     Patient doesn't eat meat   . Promethazine Hcl     Per MD chart - 08/08/11  . Penicillins Rash    Current Outpatient Prescriptions  Medication Sig Dispense Refill  . acetaminophen (TYLENOL) 500 MG tablet Take 1,000 mg by mouth every 6 (six) hours as needed for mild pain.    . Cholecalciferol (VITAMIN D3) 1000 UNITS CAPS Take 1 capsule by mouth daily.      . Cinnamon 500 MG capsule Take 500 mg by mouth daily.     . citalopram (CELEXA) 40 MG tablet Take 20 mg by mouth every morning.     . Cyanocobalamin (VITAMIN B 12 PO) Take 1 tablet by mouth every morning.     . fish oil-omega-3 fatty acids 1000 MG capsule Take 1 g by mouth daily.     . montelukast (SINGULAIR) 10 MG tablet Take 10 mg by mouth at bedtime.    . Multiple Vitamin (MULTIVITAMIN WITH MINERALS) TABS tablet Take 1 tablet by mouth daily.    . Multiple Vitamins-Minerals (MEMORY VITE) TABS Take 1 tablet by mouth daily.    . nadolol (CORGARD) 20 MG tablet Take 20 mg by mouth daily.    . ramipril (ALTACE) 5 MG capsule Take 5 mg by mouth every morning.      No current facility-administered medications for this visit.    OBJECTIVE: Filed Vitals:   08/24/14 1140  BP: 149/60  Pulse: 49  Temp:  97.5 F (36.4 C)  Resp: 20    Filed Weights   08/24/14 1140  Weight: 222 lb (100.699 kg)   ECOG FS:0 - Asymptomatic Ocular: Sclerae unicteric, pupils equal, round and reactive to light Ear-nose-throat: Oropharynx clear, dentition fair Lymphatic: No  cervical or supraclavicular adenopathy Lungs no rales or rhonchi, good excursion bilaterally Heart regular rate and rhythm, no murmur appreciated Abd soft, nontender, positive bowel sounds, has dime sized red area that is warm to the touch.  MSK no focal spinal tenderness, no joint edema Neuro: non-focal, well-oriented, appropriate affect Breasts: Deferred  LAB RESULTS: CMP     Component Value Date/Time   NA 142 05/05/2014 0512   NA 143 07/01/2010 0949   K 4.2 05/05/2014 0512   K 4.2 07/01/2010 0949   CL 106 05/05/2014 0512   CL 99 07/01/2010 0949   CO2 25 05/05/2014 0512   CO2 33 07/01/2010 0949   GLUCOSE 165* 05/05/2014 0512   GLUCOSE 105 07/01/2010 0949   BUN 12 05/05/2014 0512   BUN 13 07/01/2010 0949   CREATININE 0.56 05/05/2014 0512   CREATININE 0.7 07/01/2010 0949   CALCIUM 8.9 05/05/2014 0512   CALCIUM 9.4 07/01/2010 0949   PROT 6.4 04/06/2014 1743   PROT 6.5 07/01/2010 0949   ALBUMIN 3.0* 04/06/2014 1743   AST 62* 04/06/2014 1743   AST 24 07/01/2010 0949   ALT 24 04/06/2014 1743   ALT 19 07/01/2010 0949   ALKPHOS 91 04/06/2014 1743   ALKPHOS 87* 07/01/2010 0949   BILITOT 4.4* 04/06/2014 1743   BILITOT 1.60 07/01/2010 0949   GFRNONAA 90* 05/05/2014 0512   GFRAA >90 05/05/2014 0512   INo results found for: SPEP, UPEP Lab Results  Component Value Date   WBC 3.5* 08/24/2014   NEUTROABS 2.3 08/24/2014   HGB 12.5 08/24/2014   HCT 36.7 08/24/2014   MCV 95 08/24/2014   PLT 63* 08/24/2014   No results found for: LABCA2 No components found for: LABCA125 No results for input(s): INR in the last 168 hours.  STUDIES: No results found.  ASSESSMENT/PLAN: Ms. Timpone is a 73 year old female with iron deficiency  anemia. She's doing well. She is asymptomatic at this time.  Her CBC today was good. Her WBC is 3.5. We will see what her iron studies show.  We will put her on Doxycycline for 10 days for the infected area on her abdomen.  We will see her back in 3 months for labs and follow-up.  She knos to call here with any questions or concerns and to go to the ED in the event of an emergency. We can certainly see her sooner if need be.   Eliezer Bottom, NP 08/24/2014 11:48 AM

## 2014-08-30 DIAGNOSIS — N200 Calculus of kidney: Secondary | ICD-10-CM | POA: Diagnosis not present

## 2014-08-30 DIAGNOSIS — N281 Cyst of kidney, acquired: Secondary | ICD-10-CM | POA: Diagnosis not present

## 2014-11-23 ENCOUNTER — Ambulatory Visit (HOSPITAL_BASED_OUTPATIENT_CLINIC_OR_DEPARTMENT_OTHER): Payer: Medicare Other | Admitting: Family

## 2014-11-23 ENCOUNTER — Encounter: Payer: Self-pay | Admitting: Hematology & Oncology

## 2014-11-23 ENCOUNTER — Other Ambulatory Visit (HOSPITAL_BASED_OUTPATIENT_CLINIC_OR_DEPARTMENT_OTHER): Payer: Medicare Other | Admitting: Lab

## 2014-11-23 VITALS — BP 160/62 | HR 53 | Temp 98.0°F | Resp 16 | Ht 65.0 in | Wt 226.0 lb

## 2014-11-23 DIAGNOSIS — D509 Iron deficiency anemia, unspecified: Secondary | ICD-10-CM

## 2014-11-23 DIAGNOSIS — Z8551 Personal history of malignant neoplasm of bladder: Secondary | ICD-10-CM

## 2014-11-23 DIAGNOSIS — Z85038 Personal history of other malignant neoplasm of large intestine: Secondary | ICD-10-CM

## 2014-11-23 DIAGNOSIS — C679 Malignant neoplasm of bladder, unspecified: Secondary | ICD-10-CM

## 2014-11-23 DIAGNOSIS — C189 Malignant neoplasm of colon, unspecified: Secondary | ICD-10-CM

## 2014-11-23 LAB — CBC WITH DIFFERENTIAL (CANCER CENTER ONLY)
BASO#: 0 10*3/uL (ref 0.0–0.2)
BASO%: 0.4 % (ref 0.0–2.0)
EOS%: 2.9 % (ref 0.0–7.0)
Eosinophils Absolute: 0.1 10*3/uL (ref 0.0–0.5)
HCT: 35 % (ref 34.8–46.6)
HGB: 11.6 g/dL (ref 11.6–15.9)
LYMPH#: 0.5 10*3/uL — ABNORMAL LOW (ref 0.9–3.3)
LYMPH%: 19.2 % (ref 14.0–48.0)
MCH: 31.4 pg (ref 26.0–34.0)
MCHC: 33.1 g/dL (ref 32.0–36.0)
MCV: 95 fL (ref 81–101)
MONO#: 0.3 10*3/uL (ref 0.1–0.9)
MONO%: 11.2 % (ref 0.0–13.0)
NEUT#: 1.8 10*3/uL (ref 1.5–6.5)
NEUT%: 66.3 % (ref 39.6–80.0)
PLATELETS: 43 10*3/uL — AB (ref 145–400)
RBC: 3.69 10*6/uL — AB (ref 3.70–5.32)
RDW: 15.4 % (ref 11.1–15.7)
WBC: 2.8 10*3/uL — ABNORMAL LOW (ref 3.9–10.0)

## 2014-11-23 LAB — IRON AND TIBC CHCC
%SAT: 31 % (ref 21–57)
Iron: 76 ug/dL (ref 41–142)
TIBC: 248 ug/dL (ref 236–444)
UIBC: 171 ug/dL (ref 120–384)

## 2014-11-23 LAB — FERRITIN CHCC: Ferritin: 28 ng/ml (ref 9–269)

## 2014-11-23 LAB — RETICULOCYTES (CHCC)
ABS RETIC: 74.4 10*3/uL (ref 19.0–186.0)
RBC.: 3.72 MIL/uL — AB (ref 3.87–5.11)
Retic Ct Pct: 2 % (ref 0.4–2.3)

## 2014-11-23 NOTE — Progress Notes (Signed)
[name]  Telephone:(336) [phone] Fax:(336) [phone]  ID: [name] OB: 05-12-1941 MR#: [id number]#:[id number] Patient Care Team: Florina Ou, MD as PCP - General (Family Medicine) Bethann Goo Himmelrich, RD as Dietitian  DIAGNOSIS: 1. Recurrent iron-deficiency anemia. 2. 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 3. Remote history of stage II (T3, N0, M0) adenocarcinoma of colon. 4. 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 She is doing well and has had no issues since we saw her in December. She finished her antibiotics for the small area on her abdomen and it did go away for a while but came back. It is not red or painful so we will only watch it for now.  She denies fever, chills, n/v, cough, rash, headache, dizziness, SOB, chest pain, palpitations, abdominal pain, constipation, diarrhea, blood in urine or stool. No episodes of bleeding.  No swelling, tenderness, numbness or tingling in her extremities. No new aches or pains.  Her appetite is good and she is staying hydrated. Her weight is stable.  She does have dementia and is pleasantly forgetful. Her husband takes good care of her.   CURRENT TREATMENT: IV iron as indicated  REVIEW OF SYSTEMS: All other 10 point review of systems is negative.   PAST MEDICAL HISTORY: Past Medical History  Diagnosis Date  . Cirrhosis 08/26/2011  . Colon cancer 2001  . Bladder cancer 2003  . Memory loss     d/t chemotherapy;short and long term;takes Aricept daily  . Thrombocytopenia   . Osteoporosis     takes Vit D3 daily  . Blood transfusion   . Arthritis   . Complication of anesthesia     confusion x 2 to 3 days  . Hypertension     takes Ramipril and Metoprolol daily  . History of blood clots 50+yrs ago    legs when she was pregnancy  . Headache(784.0)     occasionally  . Joint  pain   . Joint swelling   . GERD (gastroesophageal reflux disease)     takes Protonix daily  . History of GI bleed   . Constipation   . Hemorrhoids   . History of colon polyps   . Anemia, iron deficiency     iron injection about 6wks ago  . Cataract immature   . Depression     takes Celexa daily  . Attention to urostomy     urostomy d/t hx bladder cancer  . Blood dyscrasia     thromboctopenia  . History of colon cancer   . History of kidney stones   . Hx of transfusion of whole blood     PAST SURGICAL HISTORY: Past Surgical History  Procedure Laterality Date  . Colon surgery  2004  . Total knee arthroplasty  2010    Left knee  . Colon surgery  2001  . Bladder surgery      bladder removed d/t cancer  . Esophagogastroduodenoscopy  02/27/2012    Procedure: ESOPHAGOGASTRODUODENOSCOPY (EGD);  Surgeon: [name], MD;  Location: [name];  Service: Endoscopy;  Laterality: N/A;  bedside case  . Appendectomy    . Abdominal hysterectomy    . Cholecystectomy    . Colonoscopy    . Colon resection      with colostomy then colostomy reversal  . Nephrolithotomy Left 05/04/2014    Procedure: LEFT PERCUTANEOUS NEPHROLITHOTOMY ;  Surgeon: [name], MD;  Location: [facility];  Service: Urology;  Laterality: Left;    FAMILY HISTORY Family History  Problem Relation Age of Onset  . Diabetes Brother   . Cancer Other   . Hypertension Other     GYNECOLOGIC HISTORY:  No LMP recorded. Patient has had a hysterectomy.   SOCIAL HISTORY: History   Social History  . Marital Status: Married    Spouse Name: N/A  . Number of Children: N/A  . Years of Education: N/A   Occupational History  . Not on file.   Social History Main Topics  . Smoking status: Former Smoker -- 0.50 packs/day for 35 years    Types: Cigarettes    Start date: 10/25/1955    Quit date: 09/22/1991  . Smokeless tobacco: Never Used     Comment: quit 23years ago  . Alcohol Use: No  . Drug Use: No  . Sexual  Activity: Yes    Birth Control/ Protection: Surgical   Other Topics Concern  . Not on file   Social History Narrative   Lives at home with her husband, ambulatory    ADVANCED DIRECTIVES:  <no information>  HEALTH MAINTENANCE: History  Substance Use Topics  . Smoking status: Former Smoker -- 0.50 packs/day for 35 years    Types: Cigarettes    Start date: 10/25/1955    Quit date: 09/22/1991  . Smokeless tobacco: Never Used     Comment: quit 23years ago  . Alcohol Use: No   Colonoscopy: PAP: Bone density: Lipid panel:  Allergies  Allergen Reactions  . Codeine Swelling and Rash    Swelling in throat  . Morphine And Related Swelling and Rash  . Other Anaphylaxis    Bee Stings  . Phytonadione Other (See Comments)    Patient experienced episode of cyanosis following dose of Vitamin K 10 mg IV.  . Meat Extract     Patient doesn't eat meat   . Promethazine Hcl     Per MD chart - 08/08/11  . Penicillins Rash    Current Outpatient Prescriptions  Medication Sig Dispense Refill  . acetaminophen (TYLENOL) 500 MG tablet Take 1,000 mg by mouth every 6 (six) hours as needed for mild pain.    . Cholecalciferol (VITAMIN D3) 1000 UNITS CAPS Take 1 capsule by mouth daily.      . Cinnamon 500 MG capsule Take 500 mg by mouth daily.     . citalopram (CELEXA) 40 MG tablet Take 20 mg by mouth every morning.     . Cyanocobalamin (VITAMIN B 12 PO) Take 1 tablet by mouth every morning.     . fish oil-omega-3 fatty acids 1000 MG capsule Take 1 g by mouth daily.     . Ginkgo Biloba 60 MG TABS Take by mouth every morning.     . montelukast (SINGULAIR) 10 MG tablet Take 10 mg by mouth at bedtime.    . Multiple Vitamin (MULTIVITAMIN WITH MINERALS) TABS tablet Take 1 tablet by mouth daily.    . Multiple Vitamins-Minerals (MEMORY VITE) TABS Take 1 tablet by mouth daily.    . nadolol (CORGARD) 20 MG tablet Take 20 mg by mouth daily.    . ramipril (ALTACE) 5 MG capsule Take 5 mg by mouth every  morning.      No current facility-administered medications for this visit.    OBJECTIVE: Filed Vitals:   11/23/14 1125  BP: 160/62  Pulse: 53  Temp: 98 F (36.7 C)  Resp: 16    Filed Weights  11/23/14 1125  Weight: 226 lb (102.513 kg)   ECOG FS:0 - Asymptomatic Ocular: Sclerae unicteric, pupils equal, round and reactive to light Ear-nose-throat: Oropharynx clear, dentition fair Lymphatic: No cervical or supraclavicular adenopathy Lungs no rales or rhonchi, good excursion bilaterally Heart regular rate and rhythm, no murmur appreciated Abd soft, nontender, positive bowel sounds MSK no focal spinal tenderness, no joint edema Neuro: non-focal, well-oriented, appropriate affect Breasts: Deferred  LAB RESULTS: CMP     Component Value Date/Time   NA 142 05/05/2014 0512   NA 143 07/01/2010 0949   K 4.2 05/05/2014 0512   K 4.2 07/01/2010 0949   CL 106 05/05/2014 0512   CL 99 07/01/2010 0949   CO2 25 05/05/2014 0512   CO2 33 07/01/2010 0949   GLUCOSE 165* 05/05/2014 0512   GLUCOSE 105 07/01/2010 0949   BUN 12 05/05/2014 0512   BUN 13 07/01/2010 0949   CREATININE 0.56 05/05/2014 0512   CREATININE 0.7 07/01/2010 0949   CALCIUM 8.9 05/05/2014 0512   CALCIUM 9.4 07/01/2010 0949   PROT 6.4 04/06/2014 1743   PROT 6.5 07/01/2010 0949   ALBUMIN 3.0* 04/06/2014 1743   AST 62* 04/06/2014 1743   AST 24 07/01/2010 0949   ALT 24 04/06/2014 1743   ALT 19 07/01/2010 0949   ALKPHOS 91 04/06/2014 1743   ALKPHOS 87* 07/01/2010 0949   BILITOT 4.4* 04/06/2014 1743   BILITOT 1.60 07/01/2010 0949   GFRNONAA 90* 05/05/2014 0512   GFRAA >90 05/05/2014 0512   INo results found for: SPEP, UPEP Lab Results  Component Value Date   WBC 2.8* 11/23/2014   NEUTROABS 1.8 11/23/2014   HGB 11.6 11/23/2014   HCT 35.0 11/23/2014   MCV 95 11/23/2014   PLT 43* 11/23/2014   No results found for: LABCA2 No components found for: LABCA125 No results for input(s): INR in the last 168  hours.  STUDIES: No results found.  ASSESSMENT/PLAN: Ms. Vo is a 74 year old female with iron deficiency anemia. She's doing well. She is asymptomatic at this time.  Her WBC is 2.8 and her platelets are down to 43. No episodes of bleeding. We will see what her iron studies show.  We will see her back in 2 months for labs and follow-up.  Her husband knows to call here with any questions or concerns and to go to the ED in the event of an emergency. We can certainly see her sooner if need be.   Eliezer Bottom, NP 11/23/2014 11:47 AM

## 2014-11-29 DIAGNOSIS — D3002 Benign neoplasm of left kidney: Secondary | ICD-10-CM | POA: Diagnosis not present

## 2014-11-29 DIAGNOSIS — N200 Calculus of kidney: Secondary | ICD-10-CM | POA: Diagnosis not present

## 2014-11-29 DIAGNOSIS — N281 Cyst of kidney, acquired: Secondary | ICD-10-CM | POA: Diagnosis not present

## 2014-12-14 DIAGNOSIS — J3089 Other allergic rhinitis: Secondary | ICD-10-CM | POA: Diagnosis not present

## 2014-12-14 DIAGNOSIS — H6123 Impacted cerumen, bilateral: Secondary | ICD-10-CM | POA: Diagnosis not present

## 2014-12-14 DIAGNOSIS — R05 Cough: Secondary | ICD-10-CM | POA: Diagnosis not present

## 2014-12-19 DIAGNOSIS — R05 Cough: Secondary | ICD-10-CM | POA: Diagnosis not present

## 2015-01-25 ENCOUNTER — Other Ambulatory Visit: Payer: Medicare Other

## 2015-01-25 ENCOUNTER — Ambulatory Visit: Payer: Medicare Other | Admitting: Hematology & Oncology

## 2015-02-01 ENCOUNTER — Ambulatory Visit (HOSPITAL_BASED_OUTPATIENT_CLINIC_OR_DEPARTMENT_OTHER): Payer: Medicare Other | Admitting: Hematology & Oncology

## 2015-02-01 ENCOUNTER — Encounter: Payer: Self-pay | Admitting: Hematology & Oncology

## 2015-02-01 ENCOUNTER — Other Ambulatory Visit (HOSPITAL_BASED_OUTPATIENT_CLINIC_OR_DEPARTMENT_OTHER): Payer: Medicare Other

## 2015-02-01 VITALS — BP 167/68 | HR 50 | Temp 97.7°F | Resp 16 | Ht 65.0 in | Wt 222.0 lb

## 2015-02-01 DIAGNOSIS — D696 Thrombocytopenia, unspecified: Secondary | ICD-10-CM | POA: Diagnosis not present

## 2015-02-01 DIAGNOSIS — C679 Malignant neoplasm of bladder, unspecified: Secondary | ICD-10-CM

## 2015-02-01 DIAGNOSIS — D72819 Decreased white blood cell count, unspecified: Secondary | ICD-10-CM

## 2015-02-01 DIAGNOSIS — Z85038 Personal history of other malignant neoplasm of large intestine: Secondary | ICD-10-CM

## 2015-02-01 DIAGNOSIS — D509 Iron deficiency anemia, unspecified: Secondary | ICD-10-CM

## 2015-02-01 DIAGNOSIS — C189 Malignant neoplasm of colon, unspecified: Secondary | ICD-10-CM

## 2015-02-01 LAB — CBC WITH DIFFERENTIAL (CANCER CENTER ONLY)
BASO#: 0 10*3/uL (ref 0.0–0.2)
BASO%: 0.3 % (ref 0.0–2.0)
EOS%: 3.1 % (ref 0.0–7.0)
Eosinophils Absolute: 0.1 10*3/uL (ref 0.0–0.5)
HCT: 38.1 % (ref 34.8–46.6)
HGB: 11.4 g/dL — ABNORMAL LOW (ref 11.6–15.9)
LYMPH#: 0.7 10*3/uL — ABNORMAL LOW (ref 0.9–3.3)
LYMPH%: 22 % (ref 14.0–48.0)
MCH: 31 pg (ref 26.0–34.0)
MCHC: 29.9 g/dL — AB (ref 32.0–36.0)
MCV: 104 fL — ABNORMAL HIGH (ref 81–101)
MONO#: 0.5 10*3/uL (ref 0.1–0.9)
MONO%: 15.2 % — AB (ref 0.0–13.0)
NEUT%: 59.4 % (ref 39.6–80.0)
NEUTROS ABS: 1.9 10*3/uL (ref 1.5–6.5)
PLATELETS: 58 10*3/uL — AB (ref 145–400)
RBC: 3.68 10*6/uL — ABNORMAL LOW (ref 3.70–5.32)
RDW: 15.3 % (ref 11.1–15.7)
WBC: 3.2 10*3/uL — ABNORMAL LOW (ref 3.9–10.0)

## 2015-02-01 NOTE — Progress Notes (Signed)
Hematology and Oncology Follow Up Visit  [name] [id number] 03-Sep-1941 74 y.o. 02/01/2015   Principle Diagnosis:  1. Recurrent iron-deficiency anemia. 2. 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 3. Remote history of stage II (T3, N0, M0) adenocarcinoma of colon. 4. 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     Interim History:  [name] is back for followup. [name] has been doing pretty well. [name] has not been hospitalized for quite a while. He should proms with kidney stones and urosepsis. This has been resolved.  [name]'s had no bleeding. [name]'s had no issues with her urostomy bag.. There's been no cough. Patient had no fever patient had no infection issues.  [name]'s had no leg swelling. [name]'s had no rashes. [name]'s had no bruising. [name] has severe dementia. [name] is very pleasant. [name] actually has actually no short-term memory.  Her appetite has been good. There's been no nausea or vomiting.  Her last iron studies back in March showed a ferritin of 28 with an iron saturation 31%. [name] did go ahead and get a dose of iron back then. Medications:  Current outpatient prescriptions:  .  acetaminophen (TYLENOL) 500 MG tablet, Take 1,000 mg by mouth every 6 (six) hours as needed for mild pain., Disp: , Rfl:  .  Cholecalciferol (VITAMIN D3) 1000 UNITS CAPS, Take 1 capsule by mouth daily.  , Disp: , Rfl:  .  Cinnamon 500 MG capsule, Take 500 mg by mouth daily. , Disp: , Rfl:  .  citalopram (CELEXA) 40 MG tablet, Take 20 mg by mouth every morning. , Disp: , Rfl:  .  Cyanocobalamin (VITAMIN B 12 PO), Take 1 tablet by mouth every morning. , Disp: , Rfl:  .  fish oil-omega-3 fatty acids 1000 MG capsule, Take 1 g by mouth daily. , Disp: , Rfl:  .  fluticasone (FLONASE) 50 MCG/ACT nasal spray, 1 spray each nostril twice daily., Disp: , Rfl:  .  Ginkgo Biloba 60 MG TABS, Take by mouth every  morning. , Disp: , Rfl:  .  montelukast (SINGULAIR) 10 MG tablet, Take 10 mg by mouth at bedtime., Disp: , Rfl:  .  Multiple Vitamin (MULTIVITAMIN WITH MINERALS) TABS tablet, Take 1 tablet by mouth daily., Disp: , Rfl:  .  Multiple Vitamins-Minerals (MEMORY VITE) TABS, Take 1 tablet by mouth daily., Disp: , Rfl:  .  nadolol (CORGARD) 20 MG tablet, Take 20 mg by mouth daily., Disp: , Rfl:  .  ramipril (ALTACE) 5 MG capsule, Take 5 mg by mouth every morning. , Disp: , Rfl:   Allergies:  Allergies  Allergen Reactions  . Codeine Swelling and Rash    Swelling in throat  . Morphine And Related Swelling and Rash  . Other Anaphylaxis    Bee Stings  . Phytonadione Other (See Comments)    Patient experienced episode of cyanosis following dose of Vitamin K 10 mg IV.  . Meat Extract     Patient doesn't eat meat   . Promethazine Hcl     Per MD chart - 08/08/11  . Penicillins Rash    Past Medical History, Surgical history, Social history, and Family History were reviewed and updated.  Review of Systems: As above  Physical Exam:  height is 5' 5"  (1.651 m) and weight is 222 lb (100.699 kg). Her oral temperature is 97.7 F (36.5 C). Her blood pressure is 167/68  and her pulse is 50. Her respiration is 16.   Lungs are clear. Cardiac exam regular rate and rhythm. [name] has no murmurs rubs or bruits. Abdomen is soft. [name] has good bowel sounds. There is no fluid wave. There is a palpable liver edge. Her spleen tip is palpable with inspiration under the left costal margin. Extremities shows no clubbing, cyanosis or edema. Back exam shows no kyphosis. There is no tenderness over the spine, ribs or hips. Skin exam shows no rashes, ecchymoses or petechia. [name] has some seborrheic keratoses. Neurological exam shows no focal neurological deficits. [name] does have the dementia which is chronic.  Lab Results  Component Value Date   WBC 3.2* 02/01/2015   HGB 11.4* 02/01/2015   HCT 38.1 02/01/2015   MCV 104*  02/01/2015   PLT 58* 02/01/2015     Chemistry      Component Value Date/Time   NA 142 05/05/2014 0512   NA 143 07/01/2010 0949   K 4.2 05/05/2014 0512   K 4.2 07/01/2010 0949   CL 106 05/05/2014 0512   CL 99 07/01/2010 0949   CO2 25 05/05/2014 0512   CO2 33 07/01/2010 0949   BUN 12 05/05/2014 0512   BUN 13 07/01/2010 0949   CREATININE 0.56 05/05/2014 0512   CREATININE 0.7 07/01/2010 0949      Component Value Date/Time   CALCIUM 8.9 05/05/2014 0512   CALCIUM 9.4 07/01/2010 0949   ALKPHOS 91 04/06/2014 1743   ALKPHOS 87* 07/01/2010 0949   AST 62* 04/06/2014 1743   AST 24 07/01/2010 0949   ALT 24 04/06/2014 1743   ALT 19 07/01/2010 0949   BILITOT 4.4* 04/06/2014 1743   BILITOT 1.60 07/01/2010 0949         Impression and Plan: [name] is 74 year old female. [name] has iron deficiency anemia. [name]'s doing real well from my point of view. I would think that her chance of being iron deficient right now is quite low given her MCV being so good.  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 [name] has splenomegaly. Her blood count is low but stable and asymptomatic. Her white cell count is low, stable but asymptomatic.  We will go ahead and plan to get her back in 3 months time now.  I'm glad that [name] seems to be enjoying herself. Her husband is incredibly dedicated to her and make sure that [name] makes all of her appointments. They will be gone after breakfast after they see [place] this morning.  [name], MD 5/12/20169:31 AM

## 2015-02-02 ENCOUNTER — Other Ambulatory Visit: Payer: Self-pay | Admitting: Gastroenterology

## 2015-02-02 DIAGNOSIS — K7460 Unspecified cirrhosis of liver: Secondary | ICD-10-CM | POA: Diagnosis not present

## 2015-02-02 DIAGNOSIS — R188 Other ascites: Secondary | ICD-10-CM

## 2015-02-07 ENCOUNTER — Ambulatory Visit
Admission: RE | Admit: 2015-02-07 | Discharge: 2015-02-07 | Disposition: A | Discharge status: Home / Self Care | Payer: Medicare Other | Source: Ambulatory Visit | Attending: Gastroenterology | Admitting: Gastroenterology

## 2015-02-07 DIAGNOSIS — K7460 Unspecified cirrhosis of liver: Secondary | ICD-10-CM | POA: Diagnosis not present

## 2015-02-07 DIAGNOSIS — R188 Other ascites: Secondary | ICD-10-CM

## 2015-02-07 DIAGNOSIS — Z9049 Acquired absence of other specified parts of digestive tract: Secondary | ICD-10-CM | POA: Diagnosis not present

## 2015-02-07 DIAGNOSIS — R161 Splenomegaly, not elsewhere classified: Secondary | ICD-10-CM | POA: Diagnosis not present

## 2015-03-01 DIAGNOSIS — Z803 Family history of malignant neoplasm of breast: Secondary | ICD-10-CM | POA: Diagnosis not present

## 2015-03-01 DIAGNOSIS — Z1231 Encounter for screening mammogram for malignant neoplasm of breast: Secondary | ICD-10-CM | POA: Diagnosis not present

## 2015-04-04 DIAGNOSIS — R590 Localized enlarged lymph nodes: Secondary | ICD-10-CM | POA: Diagnosis not present

## 2015-04-04 DIAGNOSIS — I10 Essential (primary) hypertension: Secondary | ICD-10-CM | POA: Diagnosis not present

## 2015-04-24 DIAGNOSIS — N200 Calculus of kidney: Secondary | ICD-10-CM | POA: Diagnosis not present

## 2015-04-24 DIAGNOSIS — R109 Unspecified abdominal pain: Secondary | ICD-10-CM | POA: Diagnosis not present

## 2015-05-29 ENCOUNTER — Encounter: Payer: Self-pay | Admitting: *Deleted

## 2015-05-29 NOTE — Telephone Encounter (Signed)
This encounter was created in error - please disregard.

## 2015-05-31 ENCOUNTER — Other Ambulatory Visit (HOSPITAL_BASED_OUTPATIENT_CLINIC_OR_DEPARTMENT_OTHER): Payer: Medicare Other

## 2015-05-31 ENCOUNTER — Encounter: Payer: Self-pay | Admitting: Hematology & Oncology

## 2015-05-31 ENCOUNTER — Ambulatory Visit (HOSPITAL_BASED_OUTPATIENT_CLINIC_OR_DEPARTMENT_OTHER): Payer: Medicare Other

## 2015-05-31 ENCOUNTER — Ambulatory Visit (HOSPITAL_BASED_OUTPATIENT_CLINIC_OR_DEPARTMENT_OTHER): Payer: Medicare Other | Admitting: Hematology & Oncology

## 2015-05-31 VITALS — BP 132/65 | HR 53 | Temp 98.1°F | Resp 16 | Ht 65.0 in | Wt 228.0 lb

## 2015-05-31 VITALS — BP 130/58 | HR 47 | Temp 98.2°F | Resp 18

## 2015-05-31 DIAGNOSIS — D62 Acute posthemorrhagic anemia: Secondary | ICD-10-CM

## 2015-05-31 DIAGNOSIS — K922 Gastrointestinal hemorrhage, unspecified: Secondary | ICD-10-CM | POA: Diagnosis not present

## 2015-05-31 DIAGNOSIS — D509 Iron deficiency anemia, unspecified: Secondary | ICD-10-CM

## 2015-05-31 DIAGNOSIS — Z0000 Encounter for general adult medical examination without abnormal findings: Secondary | ICD-10-CM | POA: Diagnosis not present

## 2015-05-31 DIAGNOSIS — C189 Malignant neoplasm of colon, unspecified: Secondary | ICD-10-CM

## 2015-05-31 DIAGNOSIS — Z23 Encounter for immunization: Secondary | ICD-10-CM | POA: Diagnosis present

## 2015-05-31 LAB — IRON AND TIBC CHCC
%SAT: 28 % (ref 21–57)
Iron: 83 ug/dL (ref 41–142)
TIBC: 295 ug/dL (ref 236–444)
UIBC: 212 ug/dL (ref 120–384)

## 2015-05-31 LAB — CBC WITH DIFFERENTIAL (CANCER CENTER ONLY)
BASO#: 0 10*3/uL (ref 0.0–0.2)
BASO%: 0.4 % (ref 0.0–2.0)
EOS ABS: 0.1 10*3/uL (ref 0.0–0.5)
EOS%: 2.9 % (ref 0.0–7.0)
HEMATOCRIT: 32.2 % — AB (ref 34.8–46.6)
HEMOGLOBIN: 10.4 g/dL — AB (ref 11.6–15.9)
LYMPH#: 0.5 10*3/uL — AB (ref 0.9–3.3)
LYMPH%: 21.3 % (ref 14.0–48.0)
MCH: 28.7 pg (ref 26.0–34.0)
MCHC: 32.3 g/dL (ref 32.0–36.0)
MCV: 89 fL (ref 81–101)
MONO#: 0.3 10*3/uL (ref 0.1–0.9)
MONO%: 12.6 % (ref 0.0–13.0)
NEUT%: 62.8 % (ref 39.6–80.0)
NEUTROS ABS: 1.5 10*3/uL (ref 1.5–6.5)
Platelets: 52 10*3/uL — ABNORMAL LOW (ref 145–400)
RBC: 3.62 10*6/uL — ABNORMAL LOW (ref 3.70–5.32)
RDW: 17 % — ABNORMAL HIGH (ref 11.1–15.7)
WBC: 2.4 10*3/uL — ABNORMAL LOW (ref 3.9–10.0)

## 2015-05-31 LAB — FERRITIN CHCC: FERRITIN: 14 ng/mL (ref 9–269)

## 2015-05-31 MED ORDER — FERUMOXYTOL INJECTION 510 MG/17 ML
510.0000 mg | Freq: Once | INTRAVENOUS | Status: AC
  Administered 2015-05-31: 510 mg via INTRAVENOUS
  Filled 2015-05-31: qty 17

## 2015-05-31 MED ORDER — INFLUENZA VAC SPLIT QUAD 0.5 ML IM SUSY
0.5000 mL | PREFILLED_SYRINGE | Freq: Once | INTRAMUSCULAR | Status: AC
  Administered 2015-05-31: 0.5 mL via INTRAMUSCULAR
  Filled 2015-05-31: qty 0.5

## 2015-05-31 NOTE — Patient Instructions (Signed)

## 2015-05-31 NOTE — Patient Instructions (Signed)

## 2015-05-31 NOTE — Progress Notes (Signed)
Hematology and Oncology Follow Up Visit  [name] [id number] 1941-01-04 74 y.o. 05/31/2015   Principle Diagnosis:  1. Recurrent iron-deficiency anemia. 2. 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 3. Remote history of stage II (T3, N0, M0) adenocarcinoma of colon. 4. 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     Interim History:  Ms.  Robertson is back for followup. She seems be doing okay. She may be a little bit more tired.  She's had no issues with nausea or vomiting.  She has had issues with kidney stones. She sees urology for this.  She has not gotten iron for several months. Last time we saw her, her ferritin was 28 with iron saturation of 31%.  Her dementia continues to be a big issue. She really cannot be left alone.  She has had no issues with rashes.  She's had no problems with infections.  Overall, her performance status is ECOG 2-3   Medications:  Current outpatient prescriptions:  .  acetaminophen (TYLENOL) 500 MG tablet, Take 1,000 mg by mouth every 6 (six) hours as needed for mild pain., Disp: , Rfl:  .  Cholecalciferol (VITAMIN D3) 1000 UNITS CAPS, Take 1 capsule by mouth daily.  , Disp: , Rfl:  .  Cinnamon 500 MG capsule, Take 500 mg by mouth daily. , Disp: , Rfl:  .  citalopram (CELEXA) 40 MG tablet, Take 20 mg by mouth every morning. , Disp: , Rfl:  .  Cyanocobalamin (VITAMIN B 12 PO), Take 1 tablet by mouth every morning. , Disp: , Rfl:  .  fish oil-omega-3 fatty acids 1000 MG capsule, Take 1 g by mouth daily. , Disp: , Rfl:  .  fluticasone (FLONASE) 50 MCG/ACT nasal spray, 1 spray each nostril twice daily., Disp: , Rfl:  .  Ginkgo Biloba 60 MG TABS, Take by mouth every morning. , Disp: , Rfl:  .  montelukast (SINGULAIR) 10 MG tablet, Take 10 mg by mouth at bedtime., Disp: , Rfl:  .  Multiple Vitamin (MULTIVITAMIN WITH MINERALS) TABS tablet,  Take 1 tablet by mouth daily., Disp: , Rfl:  .  Multiple Vitamins-Minerals (MEMORY VITE) TABS, Take 1 tablet by mouth daily., Disp: , Rfl:  .  nadolol (CORGARD) 20 MG tablet, Take 20 mg by mouth daily., Disp: , Rfl:  .  ramipril (ALTACE) 5 MG capsule, Take 5 mg by mouth every morning. , Disp: , Rfl:   Allergies:  Allergies  Allergen Reactions  . Codeine Swelling and Rash    Swelling in throat  . Morphine And Related Swelling and Rash  . Other Anaphylaxis    Bee Stings  . Phytonadione Other (See Comments)    Patient experienced episode of cyanosis following dose of Vitamin K 10 mg IV.  . Meat Extract     Patient doesn't eat meat   . Promethazine Hcl     Per MD chart - 08/08/11  . Penicillins Rash    Past Medical History, Surgical history, Social history, and Family History were reviewed and updated.  Review of Systems: As above  Physical Exam:  height is 5' 5"  (1.651 m) and weight is 228 lb (103.42 kg). Her oral temperature is 98.1 F (36.7 C). Her blood pressure is 132/65 and her pulse is 53. Her respiration is 16.   Lungs are clear. Cardiac exam regular rate and rhythm. She has no murmurs rubs or  bruits. Abdomen is soft. She has good bowel sounds. There is no fluid wave. There is a palpable liver edge. Her spleen tip is palpable with inspiration under the left costal margin. Extremities shows no clubbing, cyanosis or edema. Back exam shows no kyphosis. There is no tenderness over the spine, ribs or hips. Skin exam shows no rashes, ecchymoses or petechia. She has some seborrheic keratoses. Neurological exam shows no focal neurological deficits. She does have the dementia which is chronic.  Lab Results  Component Value Date   WBC 2.4* 05/31/2015   HGB 10.4* 05/31/2015   HCT 32.2* 05/31/2015   MCV 89 05/31/2015   PLT 52 Platelet count consistent in citrate* 05/31/2015     Chemistry      Component Value Date/Time   NA 142 05/05/2014 0512   NA 143 07/01/2010 0949   K 4.2  05/05/2014 0512   K 4.2 07/01/2010 0949   CL 106 05/05/2014 0512   CL 99 07/01/2010 0949   CO2 25 05/05/2014 0512   CO2 33 07/01/2010 0949   BUN 12 05/05/2014 0512   BUN 13 07/01/2010 0949   CREATININE 0.56 05/05/2014 0512   CREATININE 0.7 07/01/2010 0949      Component Value Date/Time   CALCIUM 8.9 05/05/2014 0512   CALCIUM 9.4 07/01/2010 0949   ALKPHOS 91 04/06/2014 1743   ALKPHOS 87* 07/01/2010 0949   AST 62* 04/06/2014 1743   AST 24 07/01/2010 0949   ALT 24 04/06/2014 1743   ALT 19 07/01/2010 0949   BILITOT 4.4* 04/06/2014 1743   BILITOT 1.60 07/01/2010 0949         Impression and Plan: [name] is 74 year old female. She has iron deficiency anemia. I did look at her blood smear. She does have some increase in microcytic red cells. I do not see any nuclear red cells. As such, I highly suspect that she is iron deficient again. I think last time she had iron was probably 6 months ago.  Since it is very difficult to get into the office because of her severe dementia, we will go ahead and give her iron today. I think this will get her through the next several months.  I will plan to see her back in another 3 months. [name], MD 9/8/201612:28 PM

## 2015-06-01 ENCOUNTER — Ambulatory Visit: Payer: Medicare Other

## 2015-06-01 ENCOUNTER — Other Ambulatory Visit: Payer: Medicare Other

## 2015-06-01 ENCOUNTER — Ambulatory Visit: Payer: Medicare Other | Admitting: Family

## 2015-06-05 DIAGNOSIS — H2513 Age-related nuclear cataract, bilateral: Secondary | ICD-10-CM | POA: Diagnosis not present

## 2015-06-06 DIAGNOSIS — Z87442 Personal history of urinary calculi: Secondary | ICD-10-CM | POA: Diagnosis not present

## 2015-06-06 DIAGNOSIS — D3002 Benign neoplasm of left kidney: Secondary | ICD-10-CM | POA: Diagnosis not present

## 2015-06-18 ENCOUNTER — Encounter: Payer: Self-pay | Admitting: Family Medicine

## 2015-06-18 ENCOUNTER — Ambulatory Visit (INDEPENDENT_AMBULATORY_CARE_PROVIDER_SITE_OTHER): Payer: Medicare Other | Admitting: Family Medicine

## 2015-06-18 VITALS — BP 136/70 | HR 58 | Temp 98.5°F | Resp 18 | Ht 65.0 in | Wt 228.0 lb

## 2015-06-18 DIAGNOSIS — Z0000 Encounter for general adult medical examination without abnormal findings: Secondary | ICD-10-CM

## 2015-06-18 DIAGNOSIS — Z7689 Persons encountering health services in other specified circumstances: Secondary | ICD-10-CM

## 2015-06-18 DIAGNOSIS — Z7189 Other specified counseling: Secondary | ICD-10-CM

## 2015-06-18 DIAGNOSIS — I10 Essential (primary) hypertension: Secondary | ICD-10-CM

## 2015-06-18 DIAGNOSIS — Z23 Encounter for immunization: Secondary | ICD-10-CM

## 2015-06-18 MED ORDER — LOSARTAN POTASSIUM 25 MG PO TABS
25.0000 mg | ORAL_TABLET | Freq: Every day | ORAL | Status: DC
Start: 2015-06-18 — End: 2016-06-11

## 2015-06-18 NOTE — Addendum Note (Signed)
Addended by: Shary Decamp B on: 06/18/2015 11:02 AM   Modules accepted: Orders

## 2015-06-18 NOTE — Progress Notes (Signed)
Subjective:    Patient ID: [name], female    DOB: 1940-10-03, 74 y.o.   MRN: [id number]  HPI Patient is here today to establish care. She has a very complicated past medical history. #1 she has a history of colon cancer. In 2001 she underwent a hemicolectomy for colon cancer. Her last colonoscopy was less than 2 years ago. She sees Dr. [name] this fall for follow-up. She also has a history of bladder cancer. Her bladder was removed in 2008. She wears a bag chronically. She follows up every 6 months with Dr. Jeffie Pollock.  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 This has caused leukopenia with a white count between 2 and 3, thrombocytopenia. She also has a history of iron deficiency anemia. She follows up regularly with Dr. [name] for iron transfusions.  She also has a history of hypertension. She is currently on nadolol I am assuming both for blood pressure as well as to help control esophageal varices from her cirrhosis. She is also on ramipril for hypertension.  Unfortunately she reports a chronic cough for 4 months. It isn't itchy irritating cough that will come and go. She denies any fevers or chills or shortness of breath. Mammogram was performed earlier this year. Colonoscopy is up-to-date. She does not require a Pap smear due to age and the fact she's had a hysterectomy. Patient has had a pneumonia vaccine 23 in the past. Flu shot was given earlier this year at her hematologist. She is due for Prevnar 13. Past Medical History  Diagnosis Date  . Cirrhosis 08/26/2011  . Memory loss     d/t chemotherapy;short and long term;takes Aricept daily  . Thrombocytopenia   . Osteoporosis     takes Vit D3 daily  . Blood transfusion   . Arthritis   . Complication of anesthesia     confusion x 2 to 3 days  . Hypertension     takes Ramipril and Metoprolol daily  . History of blood clots 50+yrs ago    legs when she was pregnancy  . Headache(784.0)       occasionally  . Joint pain   . Joint swelling   . GERD (gastroesophageal reflux disease)     takes Protonix daily  . History of GI bleed   . Constipation   . Hemorrhoids   . History of colon polyps   . Anemia, iron deficiency     iron injection about 6wks ago  . Cataract immature   . Depression     takes Celexa daily  . Attention to urostomy     urostomy d/t hx bladder cancer  . Blood dyscrasia     thromboctopenia  . History of colon cancer   . History of kidney stones   . Hx of transfusion of whole blood   . Colon cancer 2001    hemicolectomy [name]  . Bladder cancer 2003    bladder removed.  [name]   Past Surgical History  Procedure Laterality Date  . Colon surgery  2004  . Total knee arthroplasty  2010    Left knee  . Colon surgery  2001  . Bladder surgery      bladder removed d/t cancer  . Esophagogastroduodenoscopy  02/27/2012    Procedure: ESOPHAGOGASTRODUODENOSCOPY (EGD);  Surgeon: [name], MD;  Location: [name];  Service: Endoscopy;  Laterality: N/A;  bedside case  . Appendectomy    . Abdominal hysterectomy    .  Cholecystectomy    . Colonoscopy    . Colon resection      with colostomy then colostomy reversal  . Nephrolithotomy Left 05/04/2014    Procedure: LEFT PERCUTANEOUS NEPHROLITHOTOMY ;  Surgeon: [name], MD;  Location: [facility];  Service: Urology;  Laterality: Left;   Current Outpatient Prescriptions on File Prior to Visit  Medication Sig Dispense Refill  . acetaminophen (TYLENOL) 500 MG tablet Take 1,000 mg by mouth every 6 (six) hours as needed for mild pain.    . Cholecalciferol (VITAMIN D3) 1000 UNITS CAPS Take 1 capsule by mouth daily.      . Cinnamon 500 MG capsule Take 500 mg by mouth daily.     . citalopram (CELEXA) 40 MG tablet Take 20 mg by mouth every morning.     . Cyanocobalamin (VITAMIN B 12 PO) Take 1 tablet by mouth every morning.     . fish oil-omega-3 fatty acids 1000 MG capsule Take 1 g by mouth daily.     .  fluticasone (FLONASE) 50 MCG/ACT nasal spray 1 spray each nostril twice daily.    . Ginkgo Biloba 60 MG TABS Take by mouth every morning.     . montelukast (SINGULAIR) 10 MG tablet Take 10 mg by mouth at bedtime.    . Multiple Vitamin (MULTIVITAMIN WITH MINERALS) TABS tablet Take 1 tablet by mouth daily.    . Multiple Vitamins-Minerals (MEMORY VITE) TABS Take 1 tablet by mouth daily.    . nadolol (CORGARD) 20 MG tablet Take 20 mg by mouth daily.    . ramipril (ALTACE) 5 MG capsule Take 5 mg by mouth every morning.      No current facility-administered medications on file prior to visit.   Allergies  Allergen Reactions  . Codeine Swelling and Rash    Swelling in throat  . Morphine And Related Swelling and Rash  . Other Anaphylaxis    Bee Stings  . Phytonadione Other (See Comments)    Patient experienced episode of cyanosis following dose of Vitamin K 10 mg IV.  . Meat Extract     Patient doesn't eat meat   . Promethazine Hcl     Per MD chart - 08/08/11  . Penicillins Rash   Social History   Social History  . Marital Status: Married    Spouse Name: N/A  . Number of Children: N/A  . Years of Education: N/A   Occupational History  . Not on file.   Social History Main Topics  . Smoking status: Former Smoker -- 0.50 packs/day for 35 years    Types: Cigarettes    Start date: 10/25/1955    Quit date: 09/22/1991  . Smokeless tobacco: Never Used     Comment: quit 23years ago  . Alcohol Use: No  . Drug Use: No  . Sexual Activity: Yes    Birth Control/ Protection: Surgical   Other Topics Concern  . Not on file   Social History Narrative   Lives at home with her husband, ambulatory   Family History  Problem Relation Age of Onset  . Diabetes Brother   . Cancer Other   . Hypertension Other       Review of Systems  All other systems reviewed and are negative.      Objective:   Physical Exam  Constitutional: She appears well-developed and well-nourished. No  distress.  HENT:  Nose: Nose normal.  Mouth/Throat: Oropharynx is clear and moist.  Eyes: Conjunctivae are normal. Right eye  exhibits no discharge. Left eye exhibits no discharge. No scleral icterus.  Neck: Neck supple. No thyromegaly present.  Cardiovascular: Normal rate and regular rhythm.   Murmur heard. Pulmonary/Chest: Effort normal and breath sounds normal. No respiratory distress. She has no wheezes. She has no rales.  Abdominal: Soft. Bowel sounds are normal. She exhibits no distension and no mass. There is no tenderness. There is no rebound and no guarding.  Musculoskeletal: Normal range of motion. She exhibits no edema or tenderness.  Lymphadenopathy:    She has no cervical adenopathy.  Neurological: She is alert. She has normal reflexes. She displays normal reflexes. No cranial nerve deficit. She exhibits normal muscle tone. Coordination normal.  Skin: She is not diaphoretic.  Psychiatric: Cognition and memory are impaired.  Vitals reviewed.         Assessment & Plan:  Benign essential HTN - Plan: losartan (COZAAR) 25 MG tablet, COMPLETE METABOLIC PANEL WITH GFR, Lipid panel  Routine general medical examination at a health care facility - Plan: COMPLETE METABOLIC PANEL WITH GFR, Lipid panel  Establishing care with new doctor, encounter for  Patient has a 4 mm lesion on the left side of the tip of her nose. His only been there for 1 or 2 weeks. It could be just a sore. I recommended watching this at the lesion does not resolve over the next 2 weeks I would like to biopsy to rule out skin cancer. I believe the Ace inhibitor is causing her cough. Discontinue Altace and replaced with losartan 25 mg a day.  I like the patient return for a CMP as well as a fasting lipid panel to update her lab work. However given her cirrhosis I will be hesitant to place her on any cholesterol medication unless dangerously high. Patient received Prevnar 13 today. The remainder of her cancer  screening is up-to-date.

## 2015-06-19 ENCOUNTER — Other Ambulatory Visit: Payer: Medicare Other

## 2015-06-19 DIAGNOSIS — Z0000 Encounter for general adult medical examination without abnormal findings: Secondary | ICD-10-CM | POA: Diagnosis not present

## 2015-06-19 DIAGNOSIS — I10 Essential (primary) hypertension: Secondary | ICD-10-CM | POA: Diagnosis not present

## 2015-06-19 LAB — COMPLETE METABOLIC PANEL WITH GFR
ALT: 16 U/L (ref 6–29)
AST: 26 U/L (ref 10–35)
Albumin: 3 g/dL — ABNORMAL LOW (ref 3.6–5.1)
Alkaline Phosphatase: 80 U/L (ref 33–130)
BUN: 13 mg/dL (ref 7–25)
CHLORIDE: 107 mmol/L (ref 98–110)
CO2: 30 mmol/L (ref 20–31)
CREATININE: 0.5 mg/dL — AB (ref 0.60–0.93)
Calcium: 8.9 mg/dL (ref 8.6–10.4)
GFR, Est Non African American: >89 mL/min (ref >=60)
Glucose, Bld: 100 mg/dL — ABNORMAL HIGH (ref 70–99)
POTASSIUM: 3.8 mmol/L (ref 3.5–5.3)
Sodium: 141 mmol/L (ref 135–146)
Total Bilirubin: 1.9 mg/dL — ABNORMAL HIGH (ref 0.2–1.2)
Total Protein: 5.5 g/dL — ABNORMAL LOW (ref 6.1–8.1)

## 2015-06-19 LAB — LIPID PANEL
CHOL/HDL RATIO: 2.9 ratio (ref <=5.0)
CHOLESTEROL: 138 mg/dL (ref 125–200)
HDL: 47 mg/dL (ref >=46)
LDL CALC: 78 mg/dL (ref <130)
TRIGLYCERIDES: 67 mg/dL (ref <150)
VLDL: 13 mg/dL (ref <30)

## 2015-06-29 IMAGING — US US RENAL
1 series · 14 of 25 positions shown · non-contrast
Comparison: 02/28/2014 and 01/16/2014

CLINICAL DATA: Acute renal failure.

EXAM:
RENAL/URINARY TRACT ULTRASOUND COMPLETE

[Series 1: us renal · 0.25mm/px · 14 of 51 slices shown]
[im 1/51]
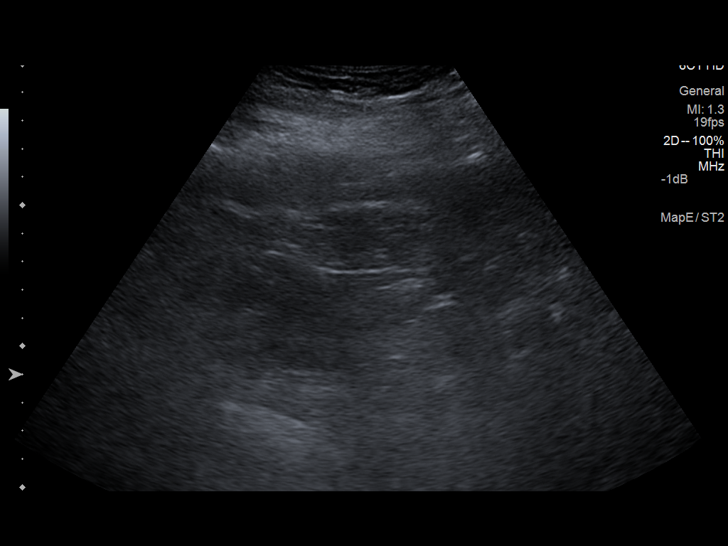
[im 5/51]
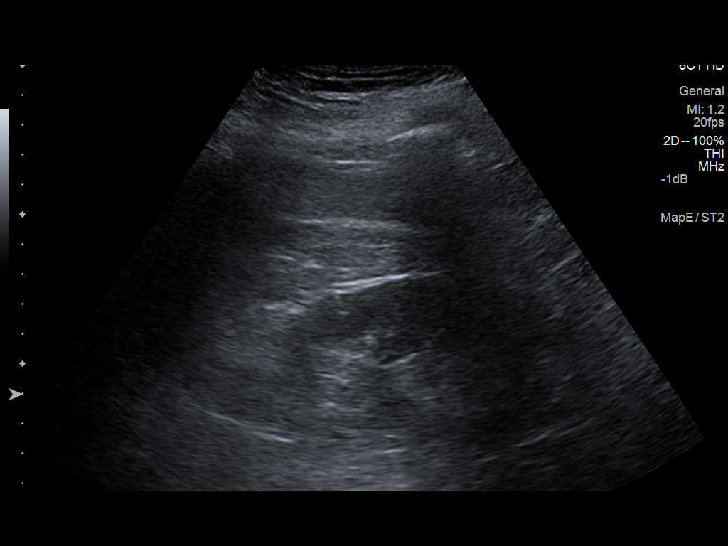
[im 9/51]
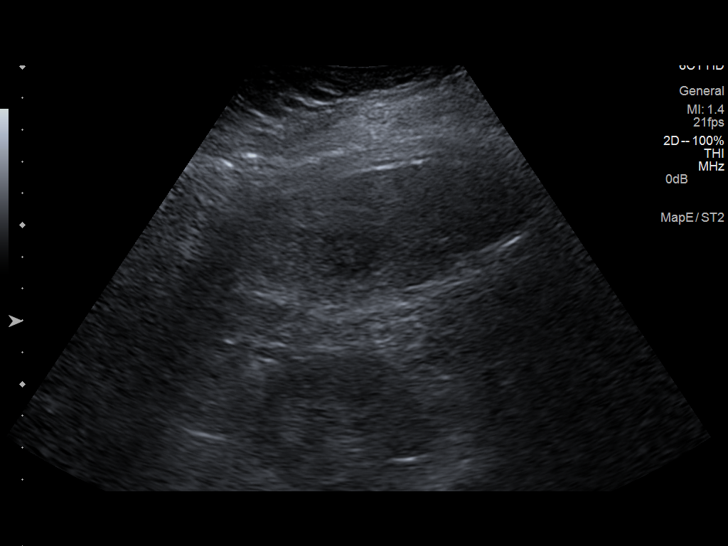
[im 13/51]
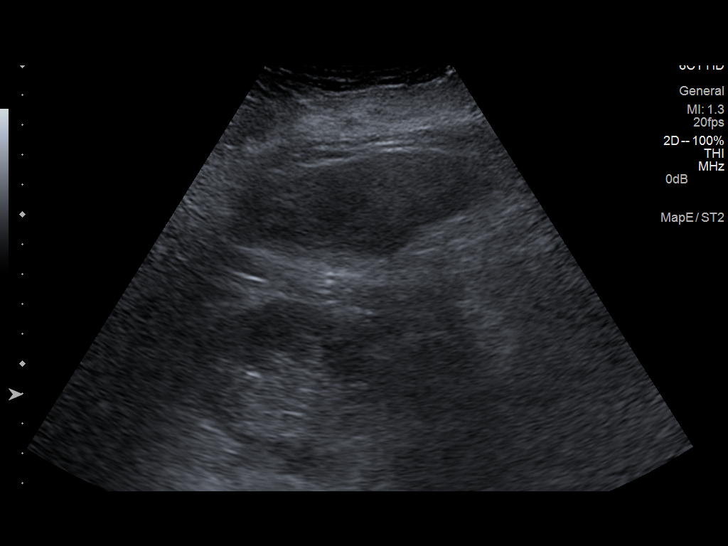
[im 17/51]
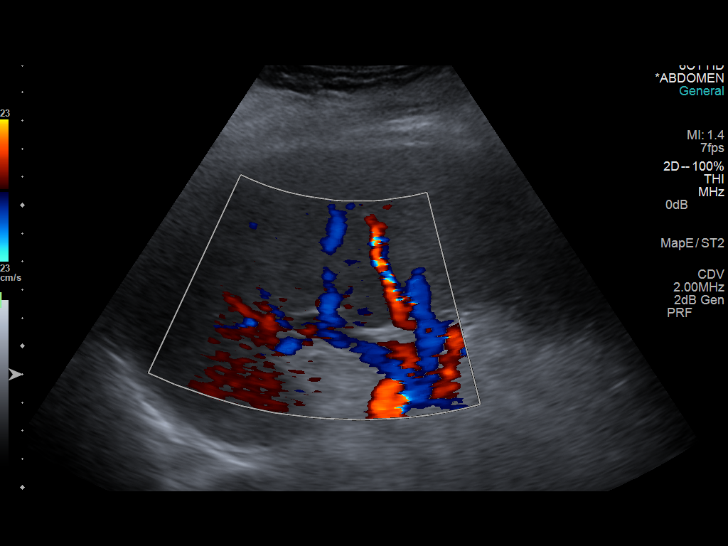
[im 19/51]
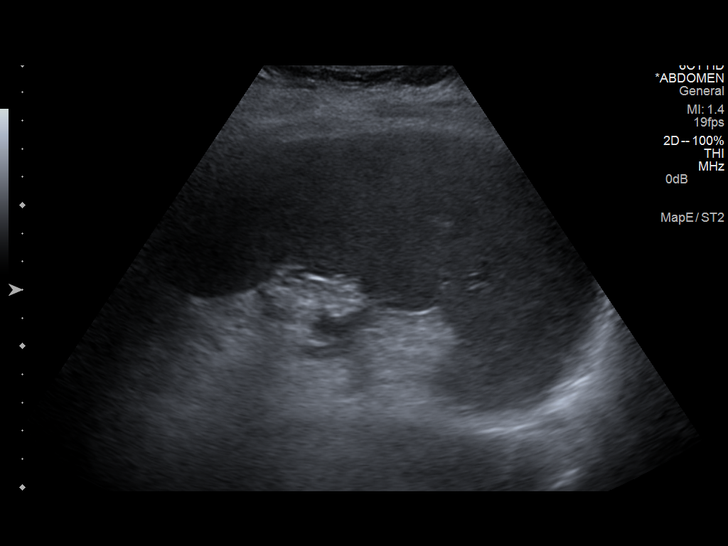
[im 23/51]
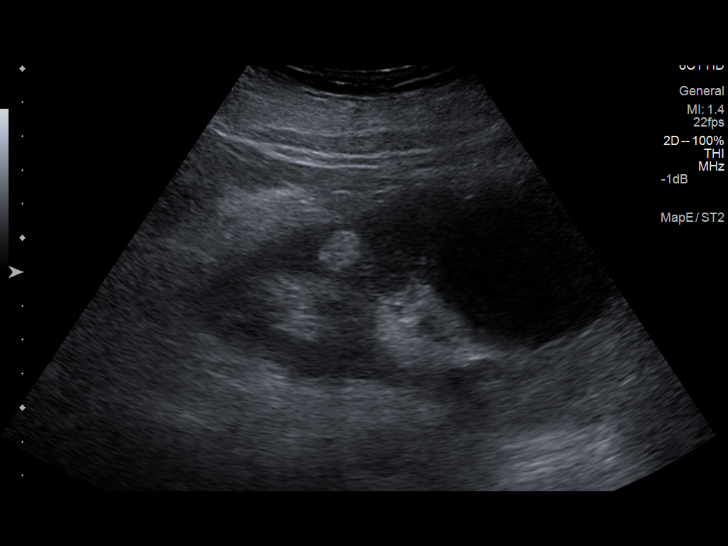
[im 28/51]
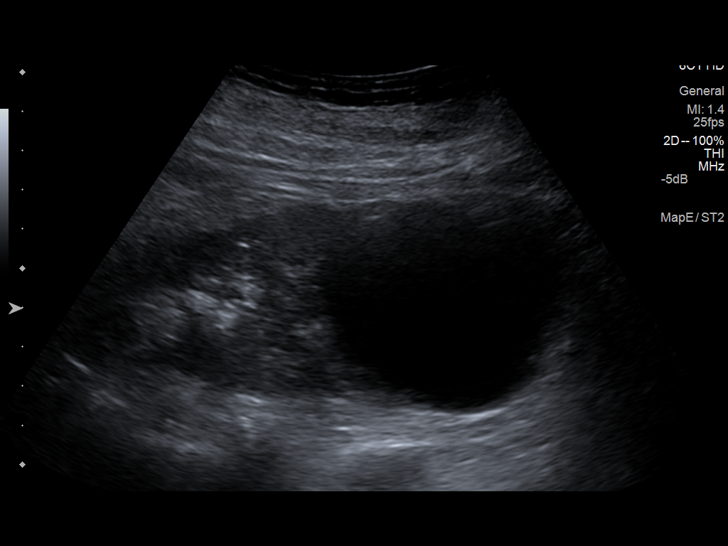
[im 32/51]
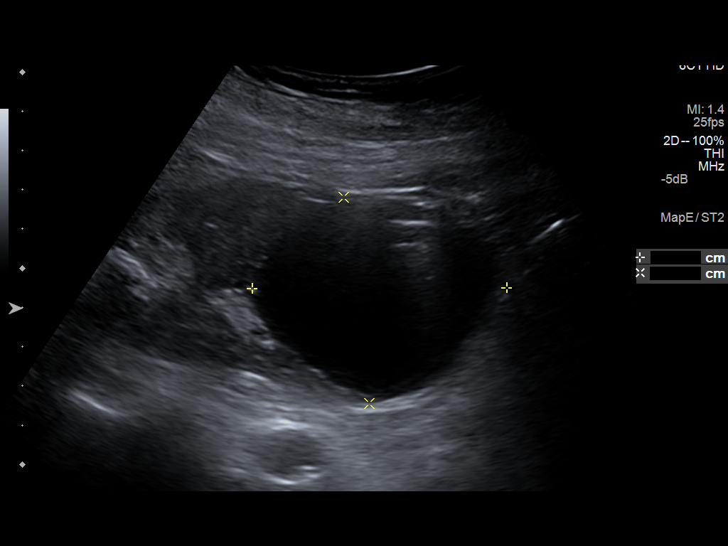
[im 34/51]
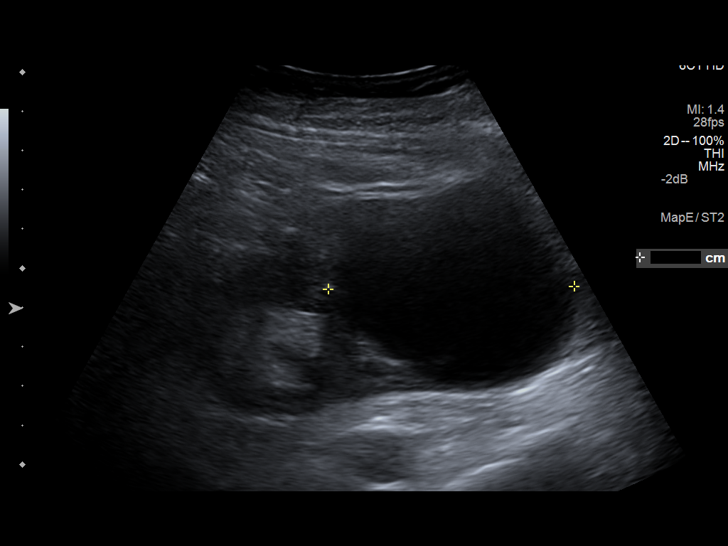
[im 38/51]
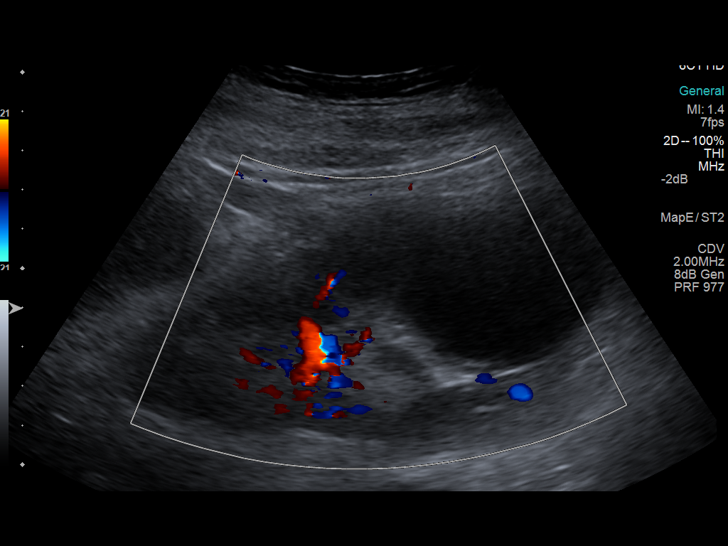
[im 42/51]
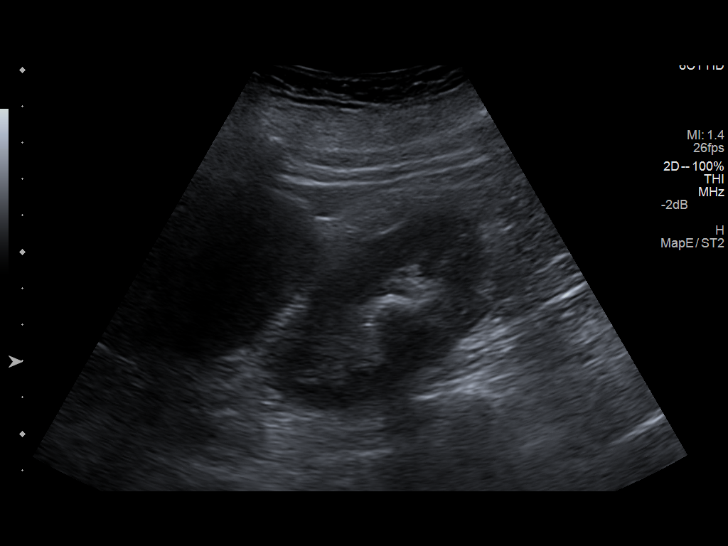
[im 46/51]
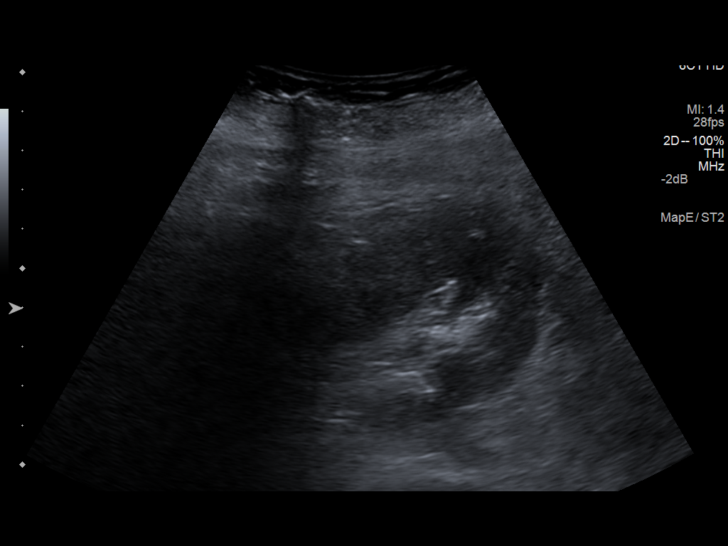
[im 51/51]
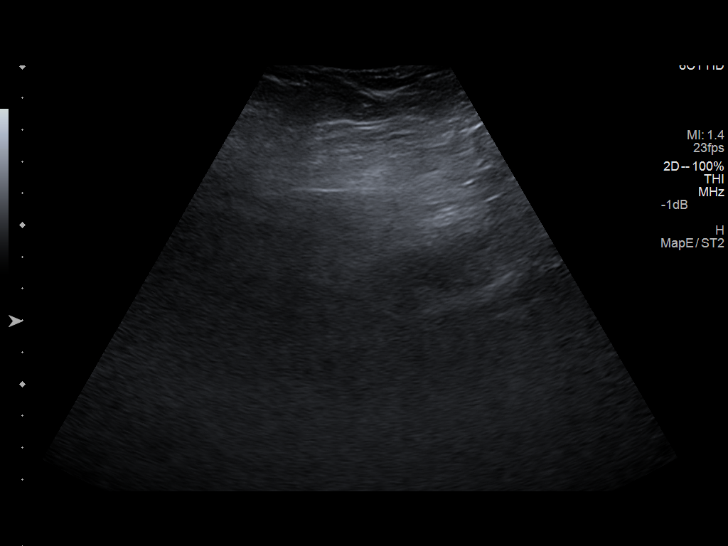

[14 of 25 positions shown; findings below may reference images not displayed]

FINDINGS: Right Kidney:

Length: 12.3 cm in length. Echogenicity within normal limits. No
mass or hydronephrosis visualized.

Left Kidney:

Length: 13.2 cm in length. 1.4 cm calculus in the upper pole. 1.5 x
1.2 x 1.2 cm hyperechoic mass lesion in the upper pole. Benign
appearing cyst in the lower pole measuring 6.5 x 5.3 x 6.3 cm. No
hydronephrosis.

Bladder:

Appears normal for degree of bladder distention.

Additional finding: The spleen is enlarged. There is a 2.0 cm
hypoechoic mass in the right lobe of the liver.
IMPRESSION: No hydronephrosis.

Echogenic mass in the upper pole of the left kidney. MRI with
contrast is recommended when feasible.

Left nephrolithiasis.

Liver mass.  MRI with contrast is recommended when feasible.

Splenomegaly.

## 2015-07-24 ENCOUNTER — Other Ambulatory Visit: Payer: Self-pay | Admitting: Gastroenterology

## 2015-07-24 DIAGNOSIS — K7469 Other cirrhosis of liver: Secondary | ICD-10-CM

## 2015-07-31 DIAGNOSIS — H18411 Arcus senilis, right eye: Secondary | ICD-10-CM | POA: Diagnosis not present

## 2015-07-31 DIAGNOSIS — H2511 Age-related nuclear cataract, right eye: Secondary | ICD-10-CM | POA: Diagnosis not present

## 2015-07-31 DIAGNOSIS — H18412 Arcus senilis, left eye: Secondary | ICD-10-CM | POA: Diagnosis not present

## 2015-07-31 DIAGNOSIS — H2512 Age-related nuclear cataract, left eye: Secondary | ICD-10-CM | POA: Diagnosis not present

## 2015-08-04 IMAGING — CT CT ABD-PELV W/ CM
1 of 3 series · 12 of 32 positions shown, 16 images · IV contrast (OMNIPAQUE 300)
Comparison: CT abdomen pelvis - 02/28/2014; 01/16/2012

CLINICAL DATA: Recurrent mid abdominal pain.

EXAM:
CT ABDOMEN AND PELVIS WITH CONTRAST
TECHNIQUE: Multidetector CT imaging of the abdomen and pelvis was performed
using the standard protocol following bolus administration of
intravenous contrast.
CONTRAST:  100mL OMNIPAQUE IOHEXOL 300 MG/ML  SOLN

[Series 2: abd/pel with · axial · 0.80mm/px · z∈[-349,+66]mm · 12 of 95 slices shown, 16 images]
[im 6/95  soft-tissue]
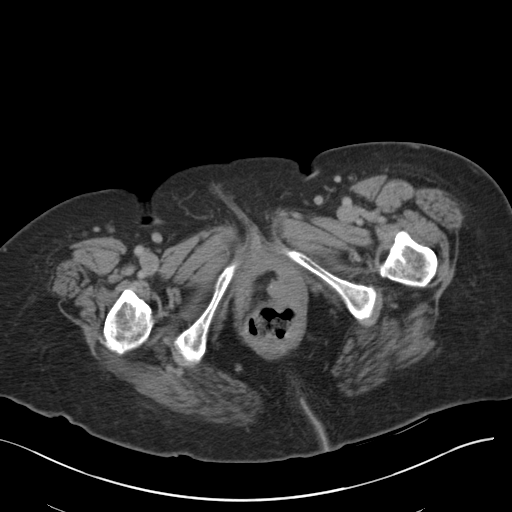
[im 6/95  bone]
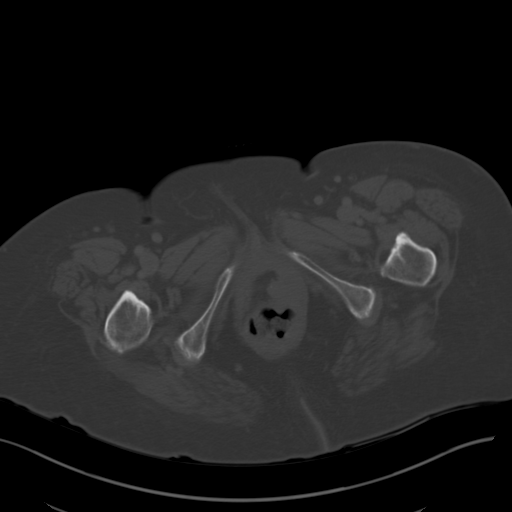
[im 16/95  soft-tissue]
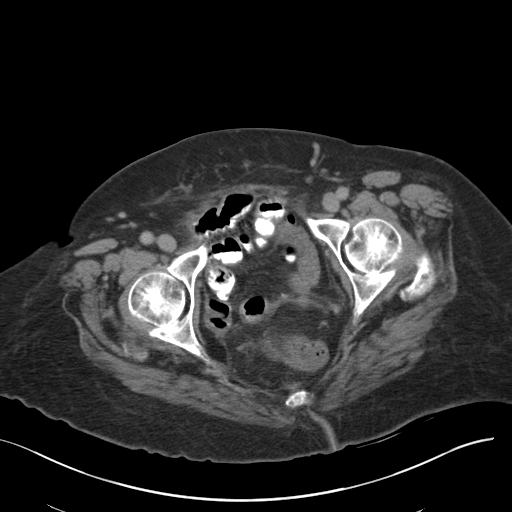
[im 27/95  soft-tissue]
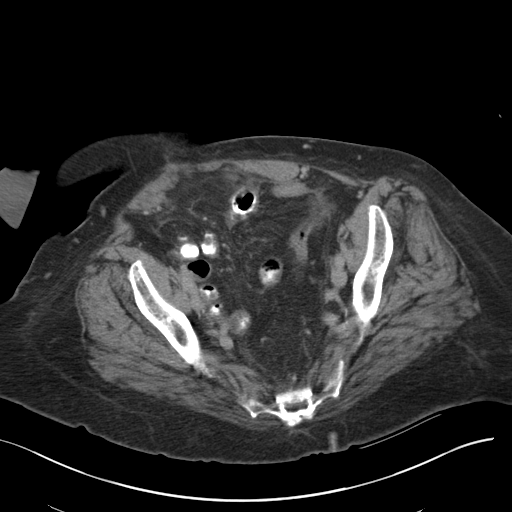
[im 32/95  soft-tissue]
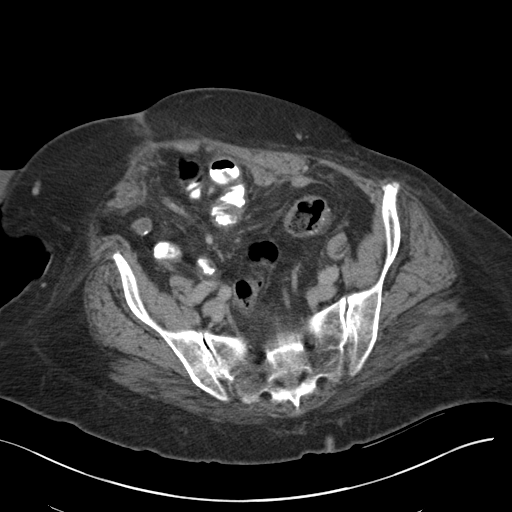
[im 42/95  soft-tissue]
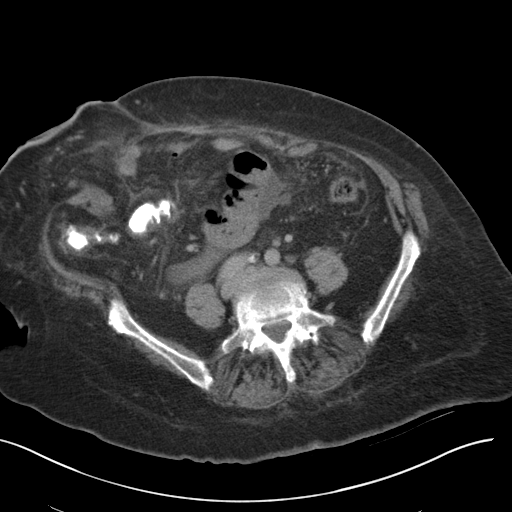
[im 53/95  soft-tissue]
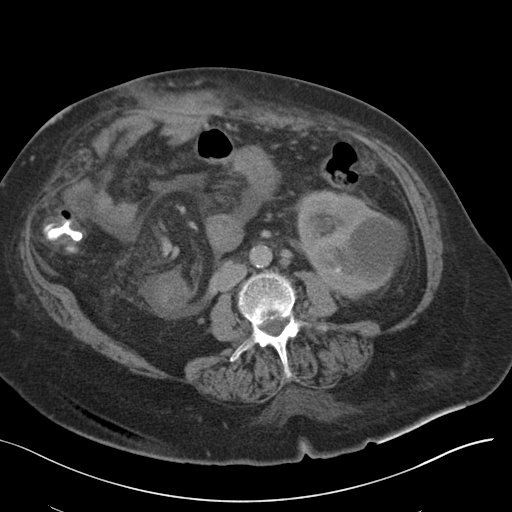
[im 63/95  soft-tissue]
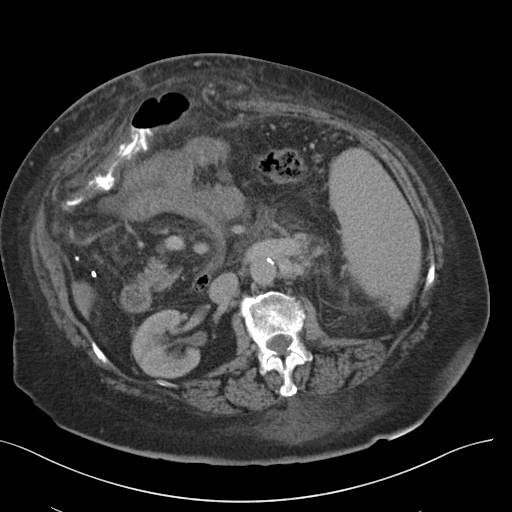
[im 68/95  soft-tissue]
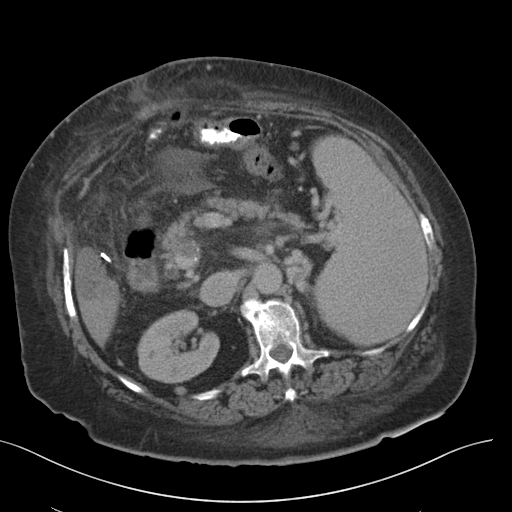
[im 74/95  lung]
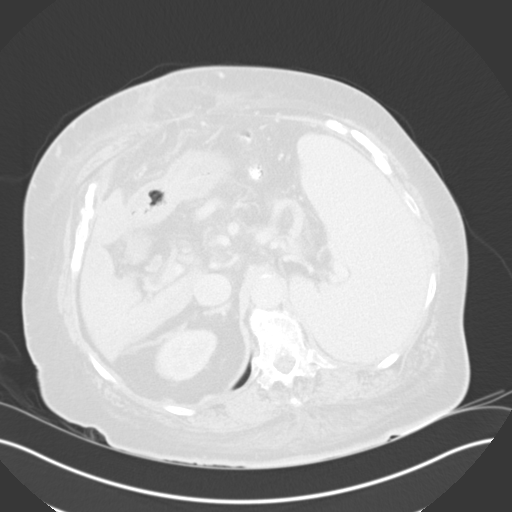
[im 79/95  soft-tissue]
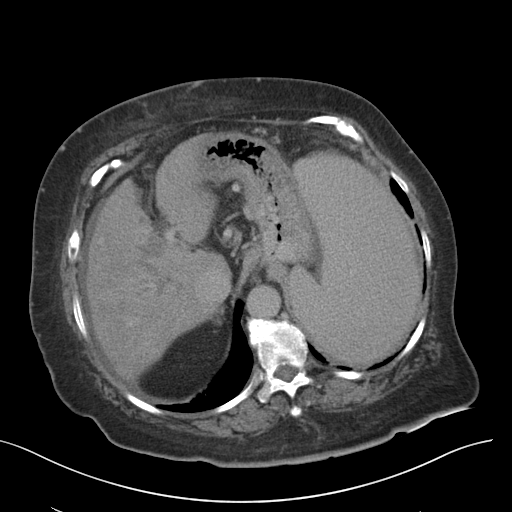
[im 79/95  lung]
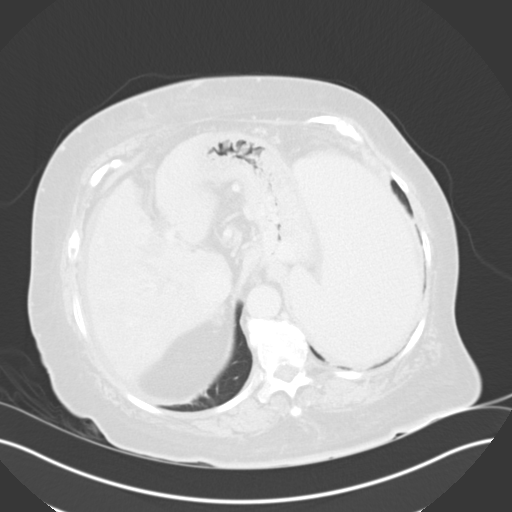
[im 79/95  bone]
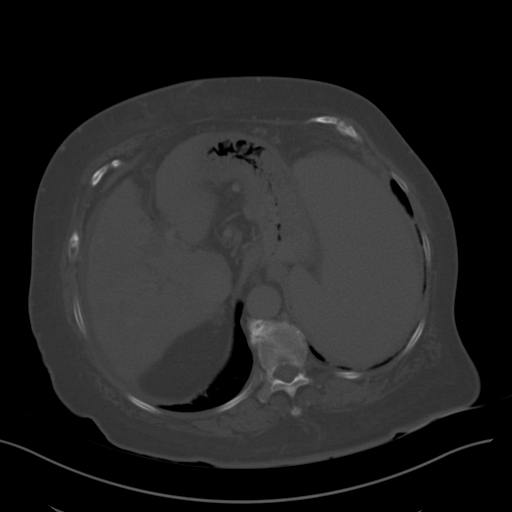
[im 84/95  lung]
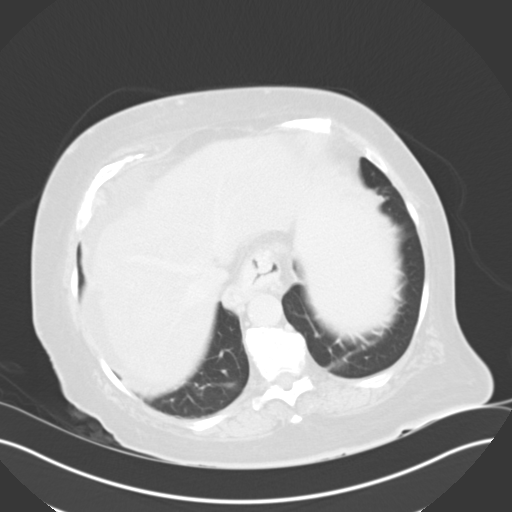
[im 89/95  soft-tissue]
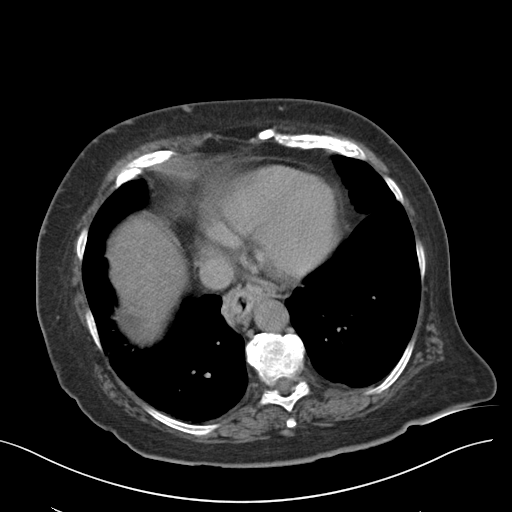
[im 89/95  lung]
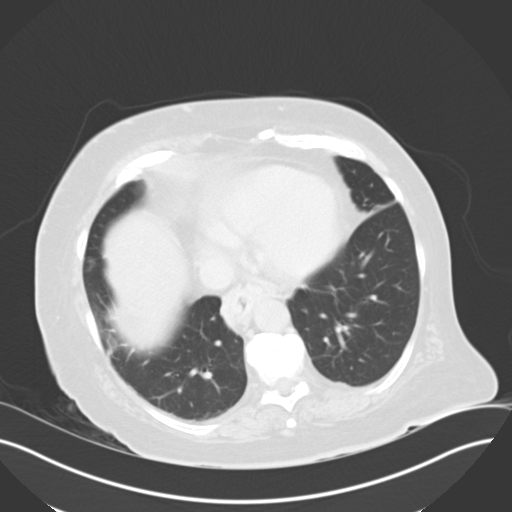

[12 of 32 positions shown; findings below may reference images not displayed]

FINDINGS: The hepatic contour remains nodular. Ill-defined hypo attenuating
lesions within the medial aspect of the right lobe of the liver are
grossly unchanged with index dominant hypoattenuating lesion
measuring approximately 5.1 x 3.1 cm (image 19, series 2 common at
dominant partially exophytic hypoattenuating lesion measuring
approximately 2.8 x 2.3 cm (coronal image 60, series 4 an additional
hypo attenuating lesion within the caudal aspect of the right lobe
of liver measuring approximately 4.6 x 2.6 cm (image 28, series 2).
There is grossly unchanged nonocclusive thrombus within the main
portal vein representative coronal image 65, series 4). The right
portal vein remains diminutive. Post cholecystectomy. There is a
trace amount of fluid about the right lobe of the liver. The spleen
remains enlarged, measuring 18.5 cm in length. Several mildly
hypertrophied gastroesophageal varices are again noted.

There has been interval displacement of previously noted
approximately 1.0 x 0.9 cm nonobstructing stone into the left renal
pelvis (representative image 39, series 2; coronal image 62). There
is an unchanged punctate (approximately 0.8 cm) nonobstructing stone
within the inferior pole of the left kidney. The amount of
left-sided pelvicaliectasis and adjacent perinephric stranding is
grossly unchanged. There is symmetric enhancement of the bilateral
kidneys, though there is slightly delayed excretion from the left
kidney.

Unchanged approximately 6.1 x 5.3 cm hypo attenuating (-2 Hounsfield
unit) renal cyst which is noted to contain a thin peripheral
septation (image 33, series 3). Additional subcentimeter
(approximately 1.1 cm hypo attenuating (approximately 7 Hounsfield
unit) renal cyst from the superior pole left kidney. The
approximately 0.9 cm lesion within the superior pole the left kidney
is too small to adequately characterize though appears to
demonstrate macroscopic fat favored to represent angio mild lipoma
(image 25, series 2). No discrete right-sided renal lesions. Post
right lower quadrant urostomy creation.

Ingested enteric contrast extends to the level of the transverse
colon. Postsurgical changes of the sigmoid colon without evidence of
enteric obstruction. A portion of the transverse colon is contain
within a wide neck (approximately 8.2 cm) incisional hernia, not
resulting in enteric obstruction. No pneumoperitoneum, pneumatosis
or portal venous gas.

There is grossly unchanged nonspecific wall thickening involving a
loop of mid small bowel (representative images 37, 43 and 51, series
2; coronal image 70, series 4), not resulting in enteric
obstruction. No definable/drainable fluid collections. No
pneumoperitoneum, pneumatosis or portal venous gas.

Scattered atherosclerotic plaque within a normal caliber abdominal
aorta. The major branch vessels of the abdominal aorta appear patent
on this non CTA examination. No retroperitoneal, mesenteric, pelvic
or inguinal lymphadenopathy on this noncontrast examination.

Limited visualization of lower thorax demonstrates minimal
subsegmental atelectasis within the imaged portions of the bilateral
lower lobes as well as the inferior segment of the lingula. No
discrete focal airspace opacities. No pleural effusion.

Normal heart size.  No pericardial effusion.

No acute or aggressive osseous abnormalities.
IMPRESSION: 1. Grossly unchanged nonspecific wall thickening involving a short
segment of a loop of mid small bowel, not resulting in enteric
obstruction, the etiology of which is not depicted on this
examination with differential considerations again including
infectious, inflammatory and ischemic etiologies.
2. Interval displacement of approximately 1.1 cm renal stone into
the left renal pelvis - this finding is of uncertain clinical
significant as there is no associated significant change in
previously noted mild left-sided pelvicaliectasis and uroepithelial
enhancement.
3. Grossly unchanged findings of cirrhosis and portal venous
hypertension with nonocclusive thrombus within the main portal vein
and stigmata of portal venous hypertension including splenomegaly
and gastroesophageal varices, however there is now with a trace
amount of ascites adjacent to the right lobe of the liver.

4. Ill-defined hypo attenuating lesions within the medial and
inferior aspects of the anterior segment of the right lobe of the
liver which in the setting of cirrhosis are worrisome for
hepatocellular carcinoma, though given history of prior malignancy
could represent metastatic disease.
5. Extensive postsurgical change of the abdomen as above.

## 2015-08-10 ENCOUNTER — Ambulatory Visit
Admission: RE | Admit: 2015-08-10 | Discharge: 2015-08-10 | Disposition: A | Discharge status: Home / Self Care | Payer: Medicare Other | Source: Ambulatory Visit | Attending: Gastroenterology | Admitting: Gastroenterology

## 2015-08-10 DIAGNOSIS — K7469 Other cirrhosis of liver: Secondary | ICD-10-CM

## 2015-08-10 DIAGNOSIS — K7460 Unspecified cirrhosis of liver: Secondary | ICD-10-CM | POA: Diagnosis not present

## 2015-08-14 DIAGNOSIS — K7460 Unspecified cirrhosis of liver: Secondary | ICD-10-CM | POA: Diagnosis not present

## 2015-08-31 ENCOUNTER — Other Ambulatory Visit (HOSPITAL_BASED_OUTPATIENT_CLINIC_OR_DEPARTMENT_OTHER): Payer: Medicare Other

## 2015-08-31 ENCOUNTER — Ambulatory Visit: Payer: Medicare Other

## 2015-08-31 ENCOUNTER — Encounter: Payer: Self-pay | Admitting: Family

## 2015-08-31 ENCOUNTER — Ambulatory Visit (HOSPITAL_BASED_OUTPATIENT_CLINIC_OR_DEPARTMENT_OTHER): Payer: Medicare Other | Admitting: Family

## 2015-08-31 VITALS — BP 156/63 | HR 49 | Temp 97.5°F | Resp 18 | Ht 65.0 in | Wt 233.0 lb

## 2015-08-31 DIAGNOSIS — K922 Gastrointestinal hemorrhage, unspecified: Secondary | ICD-10-CM

## 2015-08-31 DIAGNOSIS — Z0000 Encounter for general adult medical examination without abnormal findings: Secondary | ICD-10-CM

## 2015-08-31 DIAGNOSIS — D509 Iron deficiency anemia, unspecified: Secondary | ICD-10-CM

## 2015-08-31 DIAGNOSIS — D6959 Other secondary thrombocytopenia: Secondary | ICD-10-CM

## 2015-08-31 DIAGNOSIS — Z85038 Personal history of other malignant neoplasm of large intestine: Secondary | ICD-10-CM | POA: Diagnosis not present

## 2015-08-31 DIAGNOSIS — D62 Acute posthemorrhagic anemia: Secondary | ICD-10-CM

## 2015-08-31 DIAGNOSIS — C189 Malignant neoplasm of colon, unspecified: Secondary | ICD-10-CM

## 2015-08-31 DIAGNOSIS — D72819 Decreased white blood cell count, unspecified: Secondary | ICD-10-CM

## 2015-08-31 DIAGNOSIS — C679 Malignant neoplasm of bladder, unspecified: Secondary | ICD-10-CM | POA: Diagnosis not present

## 2015-08-31 LAB — IRON AND TIBC
%SAT: 32 % (ref 21–57)
IRON: 89 ug/dL (ref 41–142)
TIBC: 278 ug/dL (ref 236–444)
UIBC: 188 ug/dL (ref 120–384)

## 2015-08-31 LAB — CBC WITH DIFFERENTIAL (CANCER CENTER ONLY)
BASO#: 0 10*3/uL (ref 0.0–0.2)
BASO%: 0.3 % (ref 0.0–2.0)
EOS ABS: 0.1 10*3/uL (ref 0.0–0.5)
EOS%: 2.4 % (ref 0.0–7.0)
HEMATOCRIT: 34.9 % (ref 34.8–46.6)
HEMOGLOBIN: 11.7 g/dL (ref 11.6–15.9)
LYMPH#: 0.7 10*3/uL — AB (ref 0.9–3.3)
LYMPH%: 24.3 % (ref 14.0–48.0)
MCH: 31.3 pg (ref 26.0–34.0)
MCHC: 33.5 g/dL (ref 32.0–36.0)
MCV: 93 fL (ref 81–101)
MONO#: 0.4 10*3/uL (ref 0.1–0.9)
MONO%: 14.7 % — ABNORMAL HIGH (ref 0.0–13.0)
NEUT%: 58.3 % (ref 39.6–80.0)
NEUTROS ABS: 1.7 10*3/uL (ref 1.5–6.5)
Platelets: 53 10*3/uL — ABNORMAL LOW (ref 145–400)
RBC: 3.74 10*6/uL (ref 3.70–5.32)
RDW: 15.8 % — ABNORMAL HIGH (ref 11.1–15.7)
WBC: 2.9 10*3/uL — ABNORMAL LOW (ref 3.9–10.0)

## 2015-08-31 LAB — CHCC SATELLITE - SMEAR

## 2015-08-31 LAB — FERRITIN: FERRITIN: 19 ng/mL (ref 9–269)

## 2015-08-31 NOTE — Progress Notes (Signed)
Hematology and Oncology Follow Up Visit  [name] [id number] 09-01-1941 74 y.o. 08/31/2015   Principle Diagnosis:  1. Recurrent iron-deficiency anemia. 2. 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 3. Remote history of stage II (T3, N0, M0) adenocarcinoma of colon. 4. 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 She is doing well and has no complaints at this time. She last received Feraheme in September and had a nice response. At that time [name] iron saturation was 28 and ferritin 14.  She recently saw Dr. [name] and is now taking Xifaxan (started 11/29) to see if this will help with [name] memory loss. She is still pleasantly confused most of the time. [name] husband does a great job caring for [name].  [name] platelet count is 53. She has had no episodes of bleeding or bruising.  She has some mild fatigue at times.  WBC count is 2.9. No issue with infections.  No fever, chills, n/v, cough, rash, dizziness, SOB, chest pain, palpitations, abdominal pain or changes in bowel or bladder habits. [name] urostomy is functioning appropriately.  No swelling, tenderness, numbness or tingling in [name] extremities. No c/o pain.  She has a good appetite and is staying hydrated. [name] weight is stable.   Medications:    Medication List       This list is accurate as of: 08/31/15 10:32 AM.  Always use your most recent med list.               acetaminophen 500 MG tablet  Commonly known as:  TYLENOL  Take 1,000 mg by mouth every 6 (six) hours as needed for mild pain.     Cinnamon 500 MG capsule  Take 500 mg by mouth daily.     citalopram 40 MG tablet  Commonly known as:  CELEXA  Take 20 mg by mouth every morning.     fish oil-omega-3 fatty acids 1000 MG capsule  Take 1 g by mouth daily.     fluticasone 50 MCG/ACT  nasal spray  Commonly known as:  FLONASE  1 spray each nostril twice daily.     Ginkgo Biloba 60 MG Tabs  Take by mouth every morning.     losartan 25 MG tablet  Commonly known as:  COZAAR  Take 1 tablet (25 mg total) by mouth daily. Stop ramipril     MEMORY VITE Tabs  Take 1 tablet by mouth daily.     montelukast 10 MG tablet  Commonly known as:  SINGULAIR  Take 10 mg by mouth at bedtime.     multivitamin with minerals Tabs tablet  Take 1 tablet by mouth daily.     nadolol 20 MG tablet  Commonly known as:  CORGARD  Take 20 mg by mouth daily.     ramipril 5 MG capsule  Commonly known as:  ALTACE  Take 5 mg by mouth every morning.     VITAMIN B 12 PO  Take 1 tablet by mouth every morning.     Vitamin D3 1000 UNITS Caps  Take 1 capsule by mouth daily.     XIFAXAN 550 MG Tabs tablet  Generic drug:  rifaximin  TK 1 T PO BID FOR 14 DAYS        Allergies:  Allergies  Allergen Reactions  . Codeine Swelling and Rash  Swelling in throat  . Morphine And Related Swelling and Rash  . Other Anaphylaxis    Bee Stings  . Phytonadione Other (See Comments)    Patient experienced episode of cyanosis following dose of Vitamin K 10 mg IV.  . Meat Extract     Patient doesn't eat meat   . Promethazine Hcl     Per MD chart - 08/08/11  . Penicillins Rash    Past Medical History, Surgical history, Social history, and Family History were reviewed and updated.  Review of Systems: All other 10 point review of systems is negative.   Physical Exam:  height is 5' 5"  (1.651 m) and weight is 233 lb (105.688 kg). [name] oral temperature is 97.5 F (36.4 C). [name] blood pressure is 156/63 and [name] pulse is 49. [name] respiration is 18.   Wt Readings from Last 3 Encounters:  08/31/15 233 lb (105.688 kg)  06/18/15 228 lb (103.42 kg)  05/31/15 228 lb (103.42 kg)    Ocular: Sclerae unicteric, pupils equal, round and reactive to light Ear-nose-throat: Oropharynx clear, dentition  fair Lymphatic: No cervical or supraclavicular adenopathy Lungs no rales or rhonchi, good excursion bilaterally Heart regular rate and rhythm, no murmur appreciated Abd soft, nontender, positive bowel sounds MSK no focal spinal tenderness, no joint edema Neuro: non-focal, well-oriented, appropriate affect Breasts: Deferred  Lab Results  Component Value Date   WBC 2.9* 08/31/2015   HGB 11.7 08/31/2015   HCT 34.9 08/31/2015   MCV 93 08/31/2015   PLT 53* 08/31/2015   Lab Results  Component Value Date   FERRITIN 14 05/31/2015   IRON 83 05/31/2015   TIBC 295 05/31/2015   UIBC 212 05/31/2015   IRONPCTSAT 28 05/31/2015   Lab Results  Component Value Date   RETICCTPCT 2.0 11/23/2014   RBC 3.74 08/31/2015   RETICCTABS 74.4 11/23/2014   No results found for: KPAFRELGTCHN, LAMBDASER, KAPLAMBRATIO No results found for: Kandis Cocking, IGMSERUM No results found for: Odetta Pink, SPEI   Chemistry      Component Value Date/Time   NA 141 06/19/2015 1235   NA 143 07/01/2010 0949   K 3.8 06/19/2015 1235   K 4.2 07/01/2010 0949   CL 107 06/19/2015 1235   CL 99 07/01/2010 0949   CO2 30 06/19/2015 1235   CO2 33 07/01/2010 0949   BUN 13 06/19/2015 1235   BUN 13 07/01/2010 0949   CREATININE 0.50* 06/19/2015 1235   CREATININE 0.56 05/05/2014 0512      Component Value Date/Time   CALCIUM 8.9 06/19/2015 1235   CALCIUM 9.4 07/01/2010 0949   ALKPHOS 80 06/19/2015 1235   ALKPHOS 87* 07/01/2010 0949   AST 26 06/19/2015 1235   AST 24 07/01/2010 0949   ALT 16 06/19/2015 1235   ALT 19 07/01/2010 0949   BILITOT 1.9* 06/19/2015 1235   BILITOT 1.60 07/01/2010 0949     Impression and Plan: Ms. Shin is 74 yo white female with iron deficiency anemia. She has responded nicely to [facility] and is doing well.  [name] Hgb today is up to 11.7 with an MCV of 93. We will see what [name] iron studies show and bring [name] in later this week for an  infusion if needed.  We will plan to see [name] back in 4 months for labs and follow-up.  [name] husband will contact us with any questions or concerns. We can certainly see [name] sooner if need be.   Eliezer Bottom, NP  12/9/201610:32 AM

## 2015-08-31 NOTE — Progress Notes (Signed)
No treatment today per Sarah [place] NP 

## 2015-09-03 DIAGNOSIS — H2511 Age-related nuclear cataract, right eye: Secondary | ICD-10-CM | POA: Diagnosis not present

## 2015-09-03 DIAGNOSIS — H25811 Combined forms of age-related cataract, right eye: Secondary | ICD-10-CM | POA: Diagnosis not present

## 2015-09-03 DIAGNOSIS — Z961 Presence of intraocular lens: Secondary | ICD-10-CM | POA: Diagnosis not present

## 2015-09-04 DIAGNOSIS — H2512 Age-related nuclear cataract, left eye: Secondary | ICD-10-CM | POA: Diagnosis not present

## 2015-09-19 ENCOUNTER — Ambulatory Visit: Payer: Medicare Other | Admitting: Family Medicine

## 2015-09-20 ENCOUNTER — Ambulatory Visit (INDEPENDENT_AMBULATORY_CARE_PROVIDER_SITE_OTHER): Payer: Medicare Other | Admitting: Family Medicine

## 2015-09-20 ENCOUNTER — Encounter: Payer: Self-pay | Admitting: Family Medicine

## 2015-09-20 VITALS — BP 128/72 | HR 50 | Temp 97.3°F | Resp 16 | Ht 65.0 in | Wt 231.0 lb

## 2015-09-20 DIAGNOSIS — J0100 Acute maxillary sinusitis, unspecified: Secondary | ICD-10-CM

## 2015-09-20 DIAGNOSIS — J069 Acute upper respiratory infection, unspecified: Secondary | ICD-10-CM | POA: Diagnosis not present

## 2015-09-20 MED ORDER — CEFDINIR 300 MG PO CAPS: 300.0000 mg | ORAL_CAPSULE | Freq: Two times a day (BID) | ORAL | Status: DC

## 2015-09-20 NOTE — Progress Notes (Signed)
Patient ID: [name], female   DOB: 09/19/1941, 74 y.o.   MRN: [id number]   Subjective:    Patient ID: [name], female    DOB: 04/27/1941, 74 y.o.   MRN: [id number]  Patient presents for Illness asian here with her husband. For the past couple weeks she's had sinus pressure and drainage where she has had some nosebleeds. She has had Flonasebut this causes some irritation. The past few days she now has cough with production low-grade feverand some muscle aches. Her husband was also sick with similar symptoms last week. She has aken Robitussin over-the-counter. She denies any shortness of breath. She feels little bit better today with regards to the cough and congestion.    Review Of Systems:  GEN- denies fatigue, fever, weight loss,weakness, recent illness HEENT- denies eye drainage, change in vision, +nasal discharge, CVS- denies chest pain, palpitations RESP- denies SOB, +cough, wheeze ABD- denies N/V, change in stools, abd pain GU- denies dysuria, hematuria, dribbling, incontinence MSK- denies joint pain, muscle aches, injury Neuro- denies headache, dizziness, syncope, seizure activity       Objective:    BP 128/72 mmHg  Pulse 50  Temp(Src) 97.3 F (36.3 C) (Oral)  Resp 16  Ht 5' 5"  (1.651 m)  Wt 231 lb (104.781 kg)  BMI 38.44 kg/m2  SpO2 97% GEN- NAD, alert and oriented x3 HEENT- PERRL, EOMI, non injected sclera, pink conjunctiva, MMM, oropharynx mild injection, TM clear bilat no effusion,  + maxillary sinus tenderness, inflammed turbinates,  Nasal drainage  Neck- Supple, no LAD CVS- RRR, no murmur RESP-CTAB EXT- No edema Pulses- Radial 2+          Assessment & Plan:      Problem List Items Addressed This Visit    None    Visit Diagnoses    Acute maxillary sinusitis, recurrence not specified    -  Primary    Treat for sinusitis, now with some chest symptoms, antibiotics, nasal saline ,robitussin DM    Relevant Medications    cefdinir  (OMNICEF) 300 MG capsule    Acute URI        Relevant Medications    cefdinir (OMNICEF) 300 MG capsule       Note: This dictation was prepared with Dragon dictation along with smaller phrase technology. Any transcriptional errors that result from this process are unintentional.

## 2015-09-20 NOTE — Patient Instructions (Signed)
Take antibiotics as prescribed Use nasal saline Okay to use Robitussin DM for cough F/U as needed

## 2015-09-21 ENCOUNTER — Encounter: Payer: Self-pay | Admitting: Family Medicine

## 2015-10-08 DIAGNOSIS — Z961 Presence of intraocular lens: Secondary | ICD-10-CM | POA: Diagnosis not present

## 2015-10-08 DIAGNOSIS — H25812 Combined forms of age-related cataract, left eye: Secondary | ICD-10-CM | POA: Diagnosis not present

## 2015-10-08 DIAGNOSIS — H2512 Age-related nuclear cataract, left eye: Secondary | ICD-10-CM | POA: Diagnosis not present

## 2016-01-03 ENCOUNTER — Ambulatory Visit: Payer: Medicare Other

## 2016-01-03 ENCOUNTER — Encounter: Payer: Self-pay | Admitting: Family

## 2016-01-03 ENCOUNTER — Ambulatory Visit (HOSPITAL_BASED_OUTPATIENT_CLINIC_OR_DEPARTMENT_OTHER): Payer: Medicare Other | Admitting: Family

## 2016-01-03 ENCOUNTER — Other Ambulatory Visit (HOSPITAL_BASED_OUTPATIENT_CLINIC_OR_DEPARTMENT_OTHER): Payer: Medicare Other

## 2016-01-03 VITALS — BP 100/83 | HR 48 | Temp 98.0°F | Resp 16 | Ht 65.0 in | Wt 237.0 lb

## 2016-01-03 DIAGNOSIS — D509 Iron deficiency anemia, unspecified: Secondary | ICD-10-CM

## 2016-01-03 DIAGNOSIS — D6959 Other secondary thrombocytopenia: Secondary | ICD-10-CM

## 2016-01-03 DIAGNOSIS — Z8551 Personal history of malignant neoplasm of bladder: Secondary | ICD-10-CM | POA: Diagnosis not present

## 2016-01-03 DIAGNOSIS — D72819 Decreased white blood cell count, unspecified: Secondary | ICD-10-CM

## 2016-01-03 DIAGNOSIS — D696 Thrombocytopenia, unspecified: Secondary | ICD-10-CM

## 2016-01-03 DIAGNOSIS — Z85038 Personal history of other malignant neoplasm of large intestine: Secondary | ICD-10-CM

## 2016-01-03 DIAGNOSIS — K7581 Nonalcoholic steatohepatitis (NASH): Secondary | ICD-10-CM | POA: Diagnosis not present

## 2016-01-03 DIAGNOSIS — C679 Malignant neoplasm of bladder, unspecified: Secondary | ICD-10-CM

## 2016-01-03 DIAGNOSIS — C189 Malignant neoplasm of colon, unspecified: Secondary | ICD-10-CM

## 2016-01-03 LAB — CBC WITH DIFFERENTIAL (CANCER CENTER ONLY)
BASO#: 0 10*3/uL (ref 0.0–0.2)
BASO%: 0.3 % (ref 0.0–2.0)
EOS%: 3 % (ref 0.0–7.0)
Eosinophils Absolute: 0.1 10*3/uL (ref 0.0–0.5)
HEMATOCRIT: 34.1 % — AB (ref 34.8–46.6)
HEMOGLOBIN: 11.4 g/dL — AB (ref 11.6–15.9)
LYMPH#: 0.6 10*3/uL — AB (ref 0.9–3.3)
LYMPH%: 20.4 % (ref 14.0–48.0)
MCH: 30.4 pg (ref 26.0–34.0)
MCHC: 33.4 g/dL (ref 32.0–36.0)
MCV: 91 fL (ref 81–101)
MONO#: 0.4 10*3/uL (ref 0.1–0.9)
MONO%: 12 % (ref 0.0–13.0)
NEUT%: 64.3 % (ref 39.6–80.0)
NEUTROS ABS: 1.9 10*3/uL (ref 1.5–6.5)
Platelets: 64 10*3/uL — ABNORMAL LOW (ref 145–400)
RBC: 3.75 10*6/uL (ref 3.70–5.32)
RDW: 16.1 % — ABNORMAL HIGH (ref 11.1–15.7)
WBC: 3 10*3/uL — AB (ref 3.9–10.0)

## 2016-01-03 LAB — IRON AND TIBC
%SAT: 27 % (ref 21–57)
IRON: 84 ug/dL (ref 41–142)
TIBC: 310 ug/dL (ref 236–444)
UIBC: 226 ug/dL (ref 120–384)

## 2016-01-03 LAB — FERRITIN: Ferritin: 15 ng/ml (ref 9–269)

## 2016-01-03 LAB — CHCC SATELLITE - SMEAR

## 2016-01-03 NOTE — Progress Notes (Signed)
No treatment needed per MD.

## 2016-01-03 NOTE — Progress Notes (Signed)
Hematology and Oncology Follow Up Visit  [name] [id number] 02-12-1941 75 y.o. 01/03/2016   Principle Diagnosis:  1. Recurrent iron-deficiency anemia. 2. 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 3. Remote history of stage II (T3, N0, M0) adenocarcinoma of colon. 4. 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 She is doing well and has no complaints at this time. She last received Feraheme in September of last year and had a nice response. Erica Hgb today is 11.4 with an MCV of 91.  Erica platelet count is 64. She has had no episodes of bleeding or bruising.  She is pleasantly confused at this time so most information was collected from [name].  Unfortunately, the Xifaxan did not help with Erica memory so this was discontinued. She sees [name] next month and will have another abdominal US at that time.  No fever, chills, n/v, cough, rash, dizziness, chest pain, palpitations, abdominal pain or changes in bowel habits.  She has occasional SOB with exertion that resolves with taking a moment to rest.  No swelling, tenderness, numbness or tingling in Erica extremities. No c/o pain at this time.  She has a good appetite and is staying hydrated. Erica weight is stable.   Medications:    Medication List       This list is accurate as of: 01/03/16 11:07 AM.  Always use your most recent med list.               acetaminophen 500 MG tablet  Commonly known as:  TYLENOL  Take 1,000 mg by mouth every 6 (six) hours as needed for mild pain.     cefdinir 300 MG capsule  Commonly known as:  OMNICEF  Take 1 capsule (300 mg total) by mouth 2 (two) times daily.     Cinnamon 500 MG capsule  Take 500 mg by mouth daily.     citalopram 40 MG tablet  Commonly known as:  CELEXA  Take 20 mg by mouth every  morning.     fish oil-omega-3 fatty acids 1000 MG capsule  Take 1 g by mouth daily.     Ginkgo Biloba 60 MG Tabs  Take by mouth every morning.     ILEVRO 0.3 % ophthalmic suspension  Generic drug:  nepafenac     losartan 25 MG tablet  Commonly known as:  COZAAR  Take 1 tablet (25 mg total) by mouth daily. Stop ramipril     montelukast 10 MG tablet  Commonly known as:  SINGULAIR  Take 10 mg by mouth at bedtime.     multivitamin with minerals Tabs tablet  Take 1 tablet by mouth daily.     nadolol 20 MG tablet  Commonly known as:  CORGARD  Take 20 mg by mouth daily.     ramipril 5 MG capsule  Commonly known as:  ALTACE  Take 5 mg by mouth every morning.     VITAMIN B 12 PO  Take 1 tablet by mouth every morning.     Vitamin D3 1000 units Caps  Take 1 capsule by mouth daily.        Allergies:  Allergies  Allergen Reactions  . Codeine Swelling and Rash    Swelling in throat  . Morphine And Related Swelling and Rash  . Other Anaphylaxis  Bee Stings  . Phytonadione Other (See Comments)    Patient experienced episode of cyanosis following dose of Vitamin K 10 mg IV.  . Meat Extract     Patient doesn't eat meat   . Promethazine Hcl     Per MD chart - 08/08/11  . Penicillins Rash    Past Medical History, Surgical history, Social history, and Family History were reviewed and updated.  Review of Systems: All other 10 point review of systems is negative.   Physical Exam:  vitals were not taken for this visit.  Wt Readings from Last 3 Encounters:  09/20/15 231 lb (104.781 kg)  08/31/15 233 lb (105.688 kg)  06/18/15 228 lb (103.42 kg)    Ocular: Sclerae unicteric, pupils equal, round and reactive to light Ear-nose-throat: Oropharynx clear, dentition fair Lymphatic: No cervical supraclavicular or axillary adenopathy Lungs no rales or rhonchi, good excursion bilaterally Heart regular rate and rhythm, no murmur appreciated Abd soft, nontender, positive bowel  sounds, no liver or spleen tip palpated on exam, no fluid wave MSK no focal spinal tenderness, no joint edema Neuro: non-focal, well-oriented, appropriate affect Breasts: Deferred  Lab Results  Component Value Date   WBC 2.9* 08/31/2015   HGB 11.7 08/31/2015   HCT 34.9 08/31/2015   MCV 93 08/31/2015   PLT 53* 08/31/2015   Lab Results  Component Value Date   FERRITIN 19 08/31/2015   IRON 89 08/31/2015   TIBC 278 08/31/2015   UIBC 188 08/31/2015   IRONPCTSAT 32 08/31/2015   Lab Results  Component Value Date   RETICCTPCT 2.0 11/23/2014   RBC 3.74 08/31/2015   RETICCTABS 74.4 11/23/2014   No results found for: KPAFRELGTCHN, LAMBDASER, KAPLAMBRATIO No results found for: Kandis Cocking, IGMSERUM No results found for: Odetta Pink, SPEI   Chemistry      Component Value Date/Time   NA 141 06/19/2015 1235   NA 143 07/01/2010 0949   K 3.8 06/19/2015 1235   K 4.2 07/01/2010 0949   CL 107 06/19/2015 1235   CL 99 07/01/2010 0949   CO2 30 06/19/2015 1235   CO2 33 07/01/2010 0949   BUN 13 06/19/2015 1235   BUN 13 07/01/2010 0949   CREATININE 0.50* 06/19/2015 1235   CREATININE 0.56 05/05/2014 0512      Component Value Date/Time   CALCIUM 8.9 06/19/2015 1235   CALCIUM 9.4 07/01/2010 0949   ALKPHOS 80 06/19/2015 1235   ALKPHOS 87* 07/01/2010 0949   AST 26 06/19/2015 1235   AST 24 07/01/2010 0949   ALT 16 06/19/2015 1235   ALT 19 07/01/2010 0949   BILITOT 1.9* 06/19/2015 1235   BILITOT 1.60 07/01/2010 0949     Impression and Plan: [name] is 75 yo white female with iron deficiency anemia. She has responded nicely to [facility] and is doing well.  Erica Hgb today is up to 11.4 with an MCV of 91. We will see what Erica iron studies show and bring Erica in next week for an infusion if needed.  We will plan to see Erica back in 4 months for labs and follow-up.  [name] will contact us with any questions or concerns. We can  certainly see Erica sooner if need be.   Eliezer Bottom, NP 4/13/201711:07 AM

## 2016-02-05 ENCOUNTER — Other Ambulatory Visit: Payer: Self-pay | Admitting: Gastroenterology

## 2016-02-05 DIAGNOSIS — K7460 Unspecified cirrhosis of liver: Secondary | ICD-10-CM | POA: Diagnosis not present

## 2016-02-12 ENCOUNTER — Ambulatory Visit
Admission: RE | Admit: 2016-02-12 | Discharge: 2016-02-12 | Disposition: A | Discharge status: Home / Self Care | Payer: Medicare Other | Source: Ambulatory Visit | Attending: Gastroenterology | Admitting: Gastroenterology

## 2016-02-12 DIAGNOSIS — C220 Liver cell carcinoma: Secondary | ICD-10-CM | POA: Diagnosis not present

## 2016-02-12 DIAGNOSIS — K7460 Unspecified cirrhosis of liver: Secondary | ICD-10-CM

## 2016-02-22 ENCOUNTER — Other Ambulatory Visit: Payer: Self-pay | Admitting: Gastroenterology

## 2016-02-22 DIAGNOSIS — R932 Abnormal findings on diagnostic imaging of liver and biliary tract: Secondary | ICD-10-CM | POA: Diagnosis not present

## 2016-02-22 DIAGNOSIS — K7460 Unspecified cirrhosis of liver: Secondary | ICD-10-CM | POA: Diagnosis not present

## 2016-02-22 DIAGNOSIS — K7469 Other cirrhosis of liver: Secondary | ICD-10-CM

## 2016-03-05 DIAGNOSIS — N200 Calculus of kidney: Secondary | ICD-10-CM | POA: Diagnosis not present

## 2016-03-05 DIAGNOSIS — D3002 Benign neoplasm of left kidney: Secondary | ICD-10-CM | POA: Diagnosis not present

## 2016-03-06 ENCOUNTER — Other Ambulatory Visit: Payer: Self-pay | Admitting: Family Medicine

## 2016-03-12 ENCOUNTER — Ambulatory Visit
Admission: RE | Admit: 2016-03-12 | Discharge: 2016-03-12 | Disposition: A | Discharge status: Home / Self Care | Payer: Medicare Other | Source: Ambulatory Visit | Attending: Gastroenterology | Admitting: Gastroenterology

## 2016-03-12 DIAGNOSIS — K7460 Unspecified cirrhosis of liver: Secondary | ICD-10-CM | POA: Diagnosis not present

## 2016-03-12 DIAGNOSIS — K7469 Other cirrhosis of liver: Secondary | ICD-10-CM

## 2016-03-12 MED ORDER — GADOXETATE DISODIUM 0.25 MMOL/ML IV SOLN
10.0000 mL | Freq: Once | INTRAVENOUS | Status: AC | PRN
  Administered 2016-03-12: 10 mL via INTRAVENOUS

## 2016-05-08 ENCOUNTER — Other Ambulatory Visit (HOSPITAL_BASED_OUTPATIENT_CLINIC_OR_DEPARTMENT_OTHER): Payer: Medicare Other

## 2016-05-08 ENCOUNTER — Encounter: Payer: Self-pay | Admitting: Family

## 2016-05-08 ENCOUNTER — Ambulatory Visit (HOSPITAL_BASED_OUTPATIENT_CLINIC_OR_DEPARTMENT_OTHER): Payer: Medicare Other | Admitting: Family

## 2016-05-08 VITALS — BP 142/55 | HR 50 | Temp 97.9°F | Resp 18 | Ht 65.0 in | Wt 241.0 lb

## 2016-05-08 DIAGNOSIS — K7460 Unspecified cirrhosis of liver: Secondary | ICD-10-CM

## 2016-05-08 DIAGNOSIS — Z85038 Personal history of other malignant neoplasm of large intestine: Secondary | ICD-10-CM

## 2016-05-08 DIAGNOSIS — Z8551 Personal history of malignant neoplasm of bladder: Secondary | ICD-10-CM

## 2016-05-08 DIAGNOSIS — D509 Iron deficiency anemia, unspecified: Secondary | ICD-10-CM | POA: Diagnosis present

## 2016-05-08 DIAGNOSIS — D6959 Other secondary thrombocytopenia: Secondary | ICD-10-CM

## 2016-05-08 DIAGNOSIS — D696 Thrombocytopenia, unspecified: Secondary | ICD-10-CM

## 2016-05-08 DIAGNOSIS — C189 Malignant neoplasm of colon, unspecified: Secondary | ICD-10-CM

## 2016-05-08 DIAGNOSIS — K7581 Nonalcoholic steatohepatitis (NASH): Secondary | ICD-10-CM | POA: Diagnosis not present

## 2016-05-08 DIAGNOSIS — D72818 Other decreased white blood cell count: Secondary | ICD-10-CM | POA: Diagnosis not present

## 2016-05-08 DIAGNOSIS — C679 Malignant neoplasm of bladder, unspecified: Secondary | ICD-10-CM

## 2016-05-08 LAB — CBC WITH DIFFERENTIAL (CANCER CENTER ONLY)
BASO#: 0 10*3/uL (ref 0.0–0.2)
BASO%: 0.6 % (ref 0.0–2.0)
EOS%: 2.7 % (ref 0.0–7.0)
Eosinophils Absolute: 0.1 10*3/uL (ref 0.0–0.5)
HEMATOCRIT: 35.5 % (ref 34.8–46.6)
HGB: 12 g/dL (ref 11.6–15.9)
LYMPH#: 0.7 10*3/uL — AB (ref 0.9–3.3)
LYMPH%: 20.7 % (ref 14.0–48.0)
MCH: 30.8 pg (ref 26.0–34.0)
MCHC: 33.8 g/dL (ref 32.0–36.0)
MCV: 91 fL (ref 81–101)
MONO#: 0.4 10*3/uL (ref 0.1–0.9)
MONO%: 12.1 % (ref 0.0–13.0)
NEUT#: 2.2 10*3/uL (ref 1.5–6.5)
NEUT%: 63.9 % (ref 39.6–80.0)
PLATELETS: 66 10*3/uL — AB (ref 145–400)
RBC: 3.89 10*6/uL (ref 3.70–5.32)
RDW: 16.1 % — ABNORMAL HIGH (ref 11.1–15.7)
WBC: 3.4 10*3/uL — AB (ref 3.9–10.0)

## 2016-05-08 LAB — IRON AND TIBC
%SAT: 30 % (ref 21–57)
IRON: 92 ug/dL (ref 41–142)
TIBC: 306 ug/dL (ref 236–444)
UIBC: 214 ug/dL (ref 120–384)

## 2016-05-08 LAB — CHCC SATELLITE - SMEAR

## 2016-05-08 LAB — FERRITIN: FERRITIN: 13 ng/mL (ref 9–269)

## 2016-05-08 NOTE — Progress Notes (Signed)
Hematology and Oncology Follow Up Visit  [name] [id number] 04/05/1941 75 y.o. 05/08/2016   Principle Diagnosis:  1. Recurrent iron-deficiency anemia. 2. 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 3. Remote history of stage II (T3, N0, M0) adenocarcinoma of colon. 4. 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 She is pleasantly confused at this time so some information was collected from her husband. She has occasional fatigue and some abdominal discomfort with the cirrhosis. She had an MRI done in June which showed a 1.5 cm liver mass in the posterior lateral segment of the left lobe. Her husband states that Dr. [name] did not feel that this was malignant and plans to follow-up with another MRI in 6 months to reevaluate.                       No fever, chills, n/v, cough, rash, dizziness, chest pain, palpitations or changes in bowel habits. Her urostomy continues to function appropriately.  No episodes of bleeding, bruising or petechiae with her platelet count of 66. Her Hgb is improved at 12.0 with an MCV of 91.  She has occasional SOB with exertion that resolves with taking a moment to rest. This is unchanged.  No lymphadenopathy found on exam.  No swelling, tenderness, numbness or tingling in her extremities. No c/o pain at this time.  She has a good appetite and is staying hydrated. She also drinks a chocolate boost every day. Her weight is stable.  Medications:    Medication List       Accurate as of 05/08/16 11:21 AM. Always use your most recent med list.          acetaminophen 500 MG tablet Commonly known as:  TYLENOL Take 1,000 mg by mouth every 6 (six) hours as needed for mild pain.   Cinnamon 500 MG capsule Take 500 mg by mouth daily.   citalopram 40 MG tablet Commonly known  as:  CELEXA TAKE 1 TABLET BY MOUTH EVERY DAY   fish oil-omega-3 fatty acids 1000 MG capsule Take 1 g by mouth daily.   Ginkgo Biloba 60 MG Tabs Take by mouth every morning.   losartan 25 MG tablet Commonly known as:  COZAAR Take 1 tablet (25 mg total) by mouth daily. Stop ramipril   montelukast 10 MG tablet Commonly known as:  SINGULAIR Take 10 mg by mouth at bedtime.   multivitamin with minerals Tabs tablet Take 1 tablet by mouth daily.   nadolol 20 MG tablet Commonly known as:  CORGARD Take 20 mg by mouth daily.   VITAMIN B 12 PO Take 1 tablet by mouth every morning.   vitamin C 1000 MG tablet Take 1,000 mg by mouth daily.   Vitamin D3 1000 units Caps Take 1 capsule by mouth daily.   vitamin E 100 UNIT capsule Take by mouth daily.       Allergies:  Allergies  Allergen Reactions  . Codeine Swelling and Rash    Swelling in throat  . Morphine And Related Swelling and Rash  . Other Anaphylaxis    Bee Stings  . Phytonadione Other (See Comments)    Patient experienced episode of cyanosis following dose of Vitamin K 10 mg IV.  . Meat Extract     Patient doesn't eat meat   .  Promethazine Hcl     Per MD chart - 08/08/11  . Penicillins Rash    Past Medical History, Surgical history, Social history, and Family History were reviewed and updated.  Review of Systems: All other 10 point review of systems is negative.   Physical Exam:  vitals were not taken for this visit.  Wt Readings from Last 3 Encounters:  03/12/16 235 lb (106.6 kg)  01/03/16 237 lb (107.5 kg)  09/20/15 231 lb (104.8 kg)    Ocular: Sclerae unicteric, pupils equal, round and reactive to light Ear-nose-throat: Oropharynx clear, dentition fair Lymphatic: No cervical supraclavicular or axillary adenopathy Lungs no rales or rhonchi, good excursion bilaterally Heart regular rate and rhythm, no murmur appreciated Abd soft, nontender, positive bowel sounds, no liver or spleen tip palpated on  exam, no fluid wave MSK no focal spinal tenderness, no joint edema Neuro: non-focal, well-oriented, appropriate affect Breasts: Deferred  Lab Results  Component Value Date   WBC 3.0 (L) 01/03/2016   HGB 11.4 (L) 01/03/2016   HCT 34.1 (L) 01/03/2016   MCV 91 01/03/2016   PLT 64 (L) 01/03/2016   Lab Results  Component Value Date   FERRITIN 15 01/03/2016   IRON 84 01/03/2016   TIBC 310 01/03/2016   UIBC 226 01/03/2016   IRONPCTSAT 27 01/03/2016   Lab Results  Component Value Date   RETICCTPCT 2.0 11/23/2014   RBC 3.75 01/03/2016   RETICCTABS 74.4 11/23/2014   No results found for: KPAFRELGTCHN, LAMBDASER, KAPLAMBRATIO No results found for: IGGSERUM, IGA, IGMSERUM No results found for: Odetta Pink, SPEI   Chemistry      Component Value Date/Time   NA 141 06/19/2015 1235   NA 143 07/01/2010 0949   K 3.8 06/19/2015 1235   K 4.2 07/01/2010 0949   CL 107 06/19/2015 1235   CL 99 07/01/2010 0949   CO2 30 06/19/2015 1235   CO2 33 07/01/2010 0949   BUN 13 06/19/2015 1235   BUN 13 07/01/2010 0949   CREATININE 0.50 (L) 06/19/2015 1235      Component Value Date/Time   CALCIUM 8.9 06/19/2015 1235   CALCIUM 9.4 07/01/2010 0949   ALKPHOS 80 06/19/2015 1235   ALKPHOS 87 (H) 07/01/2010 0949   AST 26 06/19/2015 1235   AST 24 07/01/2010 0949   ALT 16 06/19/2015 1235   ALT 19 07/01/2010 0949   BILITOT 1.9 (H) 06/19/2015 1235   BILITOT 1.60 07/01/2010 0949     Impression and Plan: [name] is 75 yo white female with iron deficiency anemia and chronic leukopenia/thrombocytopenia secondary to cirrhosis. She continues to do fairly well but still has some fatigue at times. Her last iron infusion was in September of last year.  Hgb is stable at 12.0. We will see what her iron studies show and bring her back in next week for an infusion if needed.  We will plan to see her back in 4 months for labs and follow-up.  Her husband  will contact us with any questions or concerns. We can certainly see her sooner if need be.   Eliezer Bottom, NP 8/17/201711:21 AM

## 2016-06-11 ENCOUNTER — Other Ambulatory Visit: Payer: Self-pay | Admitting: Family Medicine

## 2016-06-11 DIAGNOSIS — I10 Essential (primary) hypertension: Secondary | ICD-10-CM

## 2016-07-01 DIAGNOSIS — Z1231 Encounter for screening mammogram for malignant neoplasm of breast: Secondary | ICD-10-CM | POA: Diagnosis not present

## 2016-07-01 DIAGNOSIS — Z803 Family history of malignant neoplasm of breast: Secondary | ICD-10-CM | POA: Diagnosis not present

## 2016-07-01 LAB — HM MAMMOGRAPHY

## 2016-07-09 ENCOUNTER — Encounter: Payer: Self-pay | Admitting: Family Medicine

## 2016-07-09 ENCOUNTER — Other Ambulatory Visit: Payer: Self-pay | Admitting: Family Medicine

## 2016-07-09 DIAGNOSIS — I10 Essential (primary) hypertension: Secondary | ICD-10-CM

## 2016-07-11 ENCOUNTER — Ambulatory Visit (INDEPENDENT_AMBULATORY_CARE_PROVIDER_SITE_OTHER): Payer: Medicare Other | Admitting: Family Medicine

## 2016-07-11 ENCOUNTER — Encounter: Payer: Self-pay | Admitting: Family Medicine

## 2016-07-11 ENCOUNTER — Ambulatory Visit
Admission: RE | Admit: 2016-07-11 | Discharge: 2016-07-11 | Disposition: A | Discharge status: Home / Self Care | Payer: Medicare Other | Source: Ambulatory Visit | Attending: Family Medicine | Admitting: Family Medicine

## 2016-07-11 VITALS — BP 150/80 | HR 62 | Temp 98.0°F | Resp 18 | Ht 65.0 in | Wt 242.0 lb

## 2016-07-11 DIAGNOSIS — K7460 Unspecified cirrhosis of liver: Secondary | ICD-10-CM | POA: Diagnosis not present

## 2016-07-11 DIAGNOSIS — R05 Cough: Secondary | ICD-10-CM

## 2016-07-11 DIAGNOSIS — I8500 Esophageal varices without bleeding: Secondary | ICD-10-CM

## 2016-07-11 DIAGNOSIS — I10 Essential (primary) hypertension: Secondary | ICD-10-CM | POA: Diagnosis not present

## 2016-07-11 DIAGNOSIS — F0390 Unspecified dementia without behavioral disturbance: Secondary | ICD-10-CM | POA: Diagnosis not present

## 2016-07-11 DIAGNOSIS — Z0000 Encounter for general adult medical examination without abnormal findings: Secondary | ICD-10-CM | POA: Diagnosis not present

## 2016-07-11 DIAGNOSIS — R059 Cough, unspecified: Secondary | ICD-10-CM

## 2016-07-11 LAB — COMPLETE METABOLIC PANEL WITH GFR
ALT: 14 U/L (ref 6–29)
AST: 27 U/L (ref 10–35)
Albumin: 3.2 g/dL — ABNORMAL LOW (ref 3.6–5.1)
Alkaline Phosphatase: 78 U/L (ref 33–130)
BUN: 13 mg/dL (ref 7–25)
CHLORIDE: 105 mmol/L (ref 98–110)
CO2: 31 mmol/L (ref 20–31)
Calcium: 8.6 mg/dL (ref 8.6–10.4)
Creat: 0.59 mg/dL — ABNORMAL LOW (ref 0.60–0.93)
GFR, EST NON AFRICAN AMERICAN: 89 mL/min (ref >=60)
GFR, Est African American: >89 mL/min (ref >=60)
GLUCOSE: 98 mg/dL (ref 70–99)
POTASSIUM: 3.3 mmol/L — AB (ref 3.5–5.3)
SODIUM: 142 mmol/L (ref 135–146)
Total Bilirubin: 1.9 mg/dL — ABNORMAL HIGH (ref 0.2–1.2)
Total Protein: 5.7 g/dL — ABNORMAL LOW (ref 6.1–8.1)

## 2016-07-11 NOTE — Progress Notes (Signed)
Subjective:    Patient ID: [name], female    DOB: 07/05/1941, 75 y.o.   MRN: [id number]  HPI  05/2015 Patient is here today to establish care. She has a very complicated past medical history. #1 she has a history of colon cancer. In 2001 she underwent a hemicolectomy for colon cancer. Her last colonoscopy was less than 2 years ago. She sees Dr. [name] this fall for follow-up. She also has a history of bladder cancer. Her bladder was removed in 2008. She wears a bag chronically. She follows up every 6 months with Dr. Jeffie Pollock.  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 This has caused leukopenia with a white count between 2 and 3, thrombocytopenia. She also has a history of iron deficiency anemia. She follows up regularly with Dr. [name] for iron transfusions.  She also has a history of hypertension. She is currently on nadolol I am assuming both for blood pressure as well as to help control esophageal varices from her cirrhosis. She is also on ramipril for hypertension.  Unfortunately she reports a chronic cough for 4 months. It is an itchy irritating cough that will come and go. She denies any fevers or chills or shortness of breath. Mammogram was performed earlier this year. Colonoscopy is up-to-date. She does not require a Pap smear due to age and the fact she's had a hysterectomy. Patient has had a pneumonia vaccine 23 in the past. Flu shot was given earlier this year at her hematologist. She is due for Prevnar 13.  At that time, my plan was: Patient has a 4 mm lesion on the left side of the tip of her nose. His only been there for 1 or 2 weeks. It could be just a sore. I recommended watching this at the lesion does not resolve over the next 2 weeks I would like to biopsy to rule out skin cancer. I believe the Ace inhibitor is causing her cough. Discontinue Altace and replaced with losartan 25 mg a day.  I like the patient return for a CMP as well  as a fasting lipid panel to update her lab work. However given her cirrhosis I will be hesitant to place her on any cholesterol medication unless dangerously high. Patient received Prevnar 13 today. The remainder of her cancer screening is up-to-date.  07/11/16 Here for CPE.  She is followed by Dr. [name] givenher history of colon cancer.  Recently was found to have a 1.5 cm mass on her liver. They're following this. She is scheduled to see the stomach doctor again in November for reevaluation. There is some concern about possible hepatocellular carcinoma although given her severe dementia and her cirrhosis and her surgical risk they're monitoring it at the present time. She is following regularly with her oncologist. Her most recent CBC in August was excellent. Her leukopenia was stable at 3. or but her hemoglobin and her hematocrit will almost normal. Her platelet count was stable at 66. She continues to report a chronic cough even after I stopped the ACE inhibitor. She denies any hemoptysis or shortness of breath or chest pain. She does have a small spot on her abdomen just inferior and the left of her bellybutton near her ventral surgical site. It is approximately 4 mm in diameter. I probed it with a Q-tip today. It is approximately half centimeter to centimeter deep. It appears to be possibly a small fistula. It has been there for more  than 3 years. There is no drainage. There is no evidence of an infection. At the present time I have no desire for surgical excision given her surgical risk. Her blood pressure slightly elevated today. Past Medical History:  Diagnosis Date  . Anemia, iron deficiency    iron injection about 6wks ago  . Arthritis   . Attention to urostomy [facility])    urostomy d/t hx bladder cancer  . Bladder cancer ([place]) 2003   bladder removed.  [name]  . Blood dyscrasia    thromboctopenia  . Blood transfusion   . Cataract immature   . Cirrhosis ([place]) 08/26/2011  . Colon cancer  [facility]) 2001   hemicolectomy [name]  . Complication of anesthesia    confusion x 2 to 3 days  . Constipation   . Depression    takes Celexa daily  . GERD (gastroesophageal reflux disease)    takes Protonix daily  . Headache(784.0)    occasionally  . Hemorrhoids   . History of blood clots 50+yrs ago   legs when she was pregnancy  . History of colon cancer   . History of colon polyps   . History of GI bleed   . History of kidney stones   . Hx of transfusion of whole blood   . Hypertension    takes Ramipril and Metoprolol daily  . Joint pain   . Joint swelling   . Memory loss    d/t chemotherapy;short and long term;takes Aricept daily  . Osteoporosis    takes Vit D3 daily  . Thrombocytopenia ([place])    Past Surgical History:  Procedure Laterality Date  . ABDOMINAL HYSTERECTOMY    . APPENDECTOMY    . BLADDER SURGERY     bladder removed d/t cancer  . CHOLECYSTECTOMY    . COLON RESECTION     with colostomy then colostomy reversal  . COLON SURGERY  2004  . COLON SURGERY  2001  . COLONOSCOPY    . ESOPHAGOGASTRODUODENOSCOPY  02/27/2012   Procedure: ESOPHAGOGASTRODUODENOSCOPY (EGD);  Surgeon: [name], MD;  Location: [name];  Service: Endoscopy;  Laterality: N/A;  bedside case  . NEPHROLITHOTOMY Left 05/04/2014   Procedure: LEFT PERCUTANEOUS NEPHROLITHOTOMY ;  Surgeon: [name], MD;  Location: [facility];  Service: Urology;  Laterality: Left;  . TOTAL KNEE ARTHROPLASTY  2010   Left knee   Current Outpatient Prescriptions on File Prior to Visit  Medication Sig Dispense Refill  . acetaminophen (TYLENOL) 500 MG tablet Take 1,000 mg by mouth every 6 (six) hours as needed for mild pain.    . Ascorbic Acid (VITAMIN C) 1000 MG tablet Take 1,000 mg by mouth daily.    . Cholecalciferol (VITAMIN D3) 1000 UNITS CAPS Take 1 capsule by mouth daily.      . Cinnamon 500 MG capsule Take 500 mg by mouth daily.     . citalopram (CELEXA) 40 MG tablet TAKE 1 TABLET BY MOUTH EVERY DAY  90 tablet 1  . Cyanocobalamin (VITAMIN B 12 PO) Take 1 tablet by mouth every morning.     . fish oil-omega-3 fatty acids 1000 MG capsule Take 1 g by mouth daily.     . Ginkgo Biloba 60 MG TABS Take by mouth every morning.     Marland Kitchen losartan (COZAAR) 25 MG tablet TAKE 1 TABLET(25 MG) BY MOUTH DAILY. STOP RAMIPRIL 30 tablet 0  . montelukast (SINGULAIR) 10 MG tablet Take 10 mg by mouth at bedtime.    Marland Kitchen  Multiple Vitamin (MULTIVITAMIN WITH MINERALS) TABS tablet Take 1 tablet by mouth daily.    . nadolol (CORGARD) 20 MG tablet Take 20 mg by mouth daily.    . vitamin E 100 UNIT capsule Take by mouth daily.     No current facility-administered medications on file prior to visit.    Allergies  Allergen Reactions  . Codeine Swelling and Rash    Swelling in throat  . Morphine And Related Swelling and Rash  . Other Anaphylaxis    Bee Stings  . Phytonadione Other (See Comments)    Patient experienced episode of cyanosis following dose of Vitamin K 10 mg IV.  . Meat Extract     Patient doesn't eat meat   . Promethazine Hcl     Per MD chart - 08/08/11  . Penicillins Rash   Social History   Social History  . Marital status: Married    Spouse name: N/A  . Number of children: N/A  . Years of education: N/A   Occupational History  . Not on file.   Social History Main Topics  . Smoking status: Former Smoker    Packs/day: 0.50    Years: 35.00    Types: Cigarettes    Start date: 10/25/1955    Quit date: 09/22/1991  . Smokeless tobacco: Never Used     Comment: quit 23years ago  . Alcohol use No  . Drug use: No  . Sexual activity: Yes    Birth control/ protection: Surgical   Other Topics Concern  . Not on file   Social History Narrative   Lives at home with her husband, ambulatory   Family History  Problem Relation Age of Onset  . Diabetes Brother   . Cancer Other   . Hypertension Other       Review of Systems  All other systems reviewed and are negative.      Objective:    Physical Exam  Constitutional: She appears well-developed and well-nourished. No distress.  HENT:  Nose: Nose normal.  Mouth/Throat: Oropharynx is clear and moist.  Eyes: Conjunctivae are normal. Right eye exhibits no discharge. Left eye exhibits no discharge. No scleral icterus.  Neck: Neck supple. No thyromegaly present.  Cardiovascular: Normal rate and regular rhythm.   Murmur heard. Pulmonary/Chest: Effort normal and breath sounds normal. No respiratory distress. She has no wheezes. She has no rales.  Abdominal: Soft. Bowel sounds are normal. She exhibits no distension and no mass. There is no tenderness. There is no rebound and no guarding.  Musculoskeletal: Normal range of motion. She exhibits no edema or tenderness.  Lymphadenopathy:    She has no cervical adenopathy.  Neurological: She is alert. She has normal reflexes. No cranial nerve deficit. She exhibits normal muscle tone. Coordination normal.  Skin: She is not diaphoretic.  Psychiatric: Cognition and memory are impaired.  Vitals reviewed.         Assessment & Plan:  Routine general medical examination at a health care facility  Benign essential HTN  Cirrhosis, non-alcoholic (HCC)  Idiopathic esophageal varices without bleeding (HCC)  Dementia without behavioral disturbance, unspecified dementia type I recommended a flu shot. We are currently out of flu shots are recommended they get that at her pharmacy. She received Pneumovax 23 today in clinic. Her mammogram was performed in October and is up-to-date. She sees GI regularly to monitor her colon cancer. She has no indication for a Pap smear. At the present time I would simply keep the fistula clean  and covered rather than proceed with surgical excision given her surgical risk. I will check a CMP to monitor renal function. Given the elevation in her blood pressure, if her renal function is stable, I will increase the dose of losartan to compensate. She is going to see  GI in November 20 monitor the mass on her liver. I will send her for a chest x-ray given the chronic cough however her exam today is completely benign.  She is not currently on a PPI. Given the chronic nature of her cough if her chest x-ray is clear, we may want to consider starting a PPI for laryngo-esophageal reflux

## 2016-07-16 ENCOUNTER — Other Ambulatory Visit: Payer: Self-pay | Admitting: Family Medicine

## 2016-07-16 MED ORDER — POTASSIUM CHLORIDE ER 10 MEQ PO TBCR: 10.0000 meq | EXTENDED_RELEASE_TABLET | Freq: Every day | ORAL | 0 refills | Status: DC

## 2016-07-16 MED ORDER — PANTOPRAZOLE SODIUM 40 MG PO TBEC: 40.0000 mg | DELAYED_RELEASE_TABLET | Freq: Every day | ORAL | 11 refills | Status: DC

## 2016-07-23 ENCOUNTER — Encounter: Payer: Self-pay | Admitting: Physician Assistant

## 2016-07-23 ENCOUNTER — Ambulatory Visit (INDEPENDENT_AMBULATORY_CARE_PROVIDER_SITE_OTHER): Payer: Medicare Other | Admitting: Physician Assistant

## 2016-07-23 VITALS — BP 132/74 | HR 58 | Temp 98.2°F | Resp 16 | Ht 65.0 in | Wt 242.0 lb

## 2016-07-23 DIAGNOSIS — B372 Candidiasis of skin and nail: Secondary | ICD-10-CM

## 2016-07-23 DIAGNOSIS — R319 Hematuria, unspecified: Secondary | ICD-10-CM

## 2016-07-23 DIAGNOSIS — N390 Urinary tract infection, site not specified: Secondary | ICD-10-CM

## 2016-07-23 DIAGNOSIS — Z936 Other artificial openings of urinary tract status: Secondary | ICD-10-CM

## 2016-07-23 DIAGNOSIS — F0281 Dementia in other diseases classified elsewhere with behavioral disturbance: Secondary | ICD-10-CM | POA: Diagnosis not present

## 2016-07-23 DIAGNOSIS — G308 Other Alzheimer's disease: Secondary | ICD-10-CM

## 2016-07-23 DIAGNOSIS — R8290 Unspecified abnormal findings in urine: Secondary | ICD-10-CM

## 2016-07-23 DIAGNOSIS — F02818 Dementia in other diseases classified elsewhere, unspecified severity, with other behavioral disturbance: Secondary | ICD-10-CM

## 2016-07-23 LAB — URINALYSIS, ROUTINE W REFLEX MICROSCOPIC
BILIRUBIN URINE: NEGATIVE
GLUCOSE, UA: NEGATIVE
Ketones, ur: NEGATIVE
Nitrite: POSITIVE — AB
PH: 7.5 (ref 5.0–8.0)
Specific Gravity, Urine: 1.015 (ref 1.001–1.035)

## 2016-07-23 LAB — URINALYSIS, MICROSCOPIC ONLY
CASTS: NONE SEEN [LPF]
SQUAMOUS EPITHELIAL / LPF: NONE SEEN [HPF] (ref <=5)
YEAST: NONE SEEN [HPF]

## 2016-07-23 MED ORDER — FLUCONAZOLE 150 MG PO TABS: 150.0000 mg | ORAL_TABLET | Freq: Once | ORAL | 0 refills | Status: AC

## 2016-07-23 MED ORDER — NYSTATIN 100000 UNIT/GM EX CREA: 1.0000 "application " | TOPICAL_CREAM | Freq: Two times a day (BID) | CUTANEOUS | 0 refills | Status: DC

## 2016-07-23 MED ORDER — CIPROFLOXACIN HCL 500 MG PO TABS: 500.0000 mg | ORAL_TABLET | Freq: Two times a day (BID) | ORAL | 0 refills | Status: DC

## 2016-07-23 NOTE — Progress Notes (Signed)
Patient ID: SHITAL CRAYTON MRN: [id number], DOB: April 08, 1941, 75 y.o. Date of Encounter: @DATE @  Chief Complaint:  Chief Complaint  Patient presents with  . urine odor  . Rash    HPI: 75 y.o. year old female  presents with her husband.   She had dementia so husband provides history.   Says the day before yesterday he noticed a little bit of blood in her urostomy bag. However says that he knows she has a kidney stones didn't know if it was just coming from that. Also is noticing that it is cloudy and more odor than usual. Says was noticing the same things that he has noticed she has had prior urine infections so brought her in.  Also reports that he just noticed rash in the skin fold/crease of her groin bilaterally.   Past Medical History:  Diagnosis Date  . Anemia, iron deficiency    iron injection about 6wks ago  . Arthritis   . Attention to urostomy [facility])    urostomy d/t hx bladder cancer  . Bladder cancer ([place]) 2003   bladder removed.  [name]  . Blood dyscrasia    thromboctopenia  . Blood transfusion   . Cataract immature   . Cirrhosis ([place]) 08/26/2011  . Colon cancer [facility]) 2001   hemicolectomy [name]  . Complication of anesthesia    confusion x 2 to 3 days  . Constipation   . Depression    takes Celexa daily  . GERD (gastroesophageal reflux disease)    takes Protonix daily  . Headache(784.0)    occasionally  . Hemorrhoids   . History of blood clots 50+yrs ago   legs when she was pregnancy  . History of colon cancer   . History of colon polyps   . History of GI bleed   . History of kidney stones   . Hx of transfusion of whole blood   . Hypertension    takes Ramipril and Metoprolol daily  . Joint pain   . Joint swelling   . Memory loss    d/t chemotherapy;short and long term;takes Aricept daily  . Osteoporosis    takes Vit D3 daily  . Thrombocytopenia ([place])      Home Meds: Outpatient Medications Prior to Visit  Medication Sig Dispense  Refill  . acetaminophen (TYLENOL) 500 MG tablet Take 1,000 mg by mouth every 6 (six) hours as needed for mild pain.    . Ascorbic Acid (VITAMIN C) 1000 MG tablet Take 1,000 mg by mouth daily.    . Cholecalciferol (VITAMIN D3) 1000 UNITS CAPS Take 1 capsule by mouth daily.      . Cinnamon 500 MG capsule Take 500 mg by mouth daily.     . citalopram (CELEXA) 40 MG tablet TAKE 1 TABLET BY MOUTH EVERY DAY 90 tablet 1  . Cyanocobalamin (VITAMIN B 12 PO) Take 1 tablet by mouth every morning.     . fish oil-omega-3 fatty acids 1000 MG capsule Take 1 g by mouth daily.     . Ginkgo Biloba 60 MG TABS Take by mouth every morning.     Marland Kitchen losartan (COZAAR) 25 MG tablet TAKE 1 TABLET(25 MG) BY MOUTH DAILY. STOP RAMIPRIL 30 tablet 0  . montelukast (SINGULAIR) 10 MG tablet Take 10 mg by mouth at bedtime.    . Multiple Vitamin (MULTIVITAMIN WITH MINERALS) TABS tablet Take 1 tablet by mouth daily.    . nadolol (CORGARD) 20 MG tablet Take 20 mg by  mouth daily.    . pantoprazole (PROTONIX) 40 MG tablet Take 1 tablet (40 mg total) by mouth daily. 30 tablet 11  . potassium chloride (K-DUR) 10 MEQ tablet Take 1 tablet (10 mEq total) by mouth daily. 5 tablet 0  . vitamin E 100 UNIT capsule Take by mouth daily.     No facility-administered medications prior to visit.     Allergies:  Allergies  Allergen Reactions  . Codeine Swelling and Rash    Swelling in throat  . Morphine And Related Swelling and Rash  . Other Anaphylaxis    Bee Stings  . Phytonadione Other (See Comments)    Patient experienced episode of cyanosis following dose of Vitamin K 10 mg IV.  . Meat Extract     Patient doesn't eat meat   . Promethazine Hcl     Per MD chart - 08/08/11  . Penicillins Rash    Social History   Social History  . Marital status: Married    Spouse name: N/A  . Number of children: N/A  . Years of education: N/A   Occupational History  . Not on file.   Social History Main Topics  . Smoking status: Former  Smoker    Packs/day: 0.50    Years: 35.00    Types: Cigarettes    Start date: 10/25/1955    Quit date: 09/22/1991  . Smokeless tobacco: Never Used     Comment: quit 23years ago  . Alcohol use No  . Drug use: No  . Sexual activity: Yes    Birth control/ protection: Surgical   Other Topics Concern  . Not on file   Social History Narrative   Lives at home with her husband, ambulatory    Family History  Problem Relation Age of Onset  . Diabetes Brother   . Cancer Other   . Hypertension Other      Review of Systems:  See HPI for pertinent ROS. All other ROS negative.    Physical Exam: Blood pressure 132/74, pulse (!) 58, temperature 98.2 F (36.8 C), temperature source Oral, resp. rate 16, height 5' 5"  (1.651 m), weight 242 lb (109.8 kg), SpO2 98 %., Body mass index is 40.27 kg/m. General: WNWD WF. Appears in no acute distress. Neck: Supple. No thyromegaly. No lymphadenopathy. Lungs: Clear bilaterally to auscultation without wheezes, rales, or rhonchi. Breathing is unlabored. Heart: RRR with S1 S2. No murmurs, rubs, or gallops. Abdomen: Soft, non-tender, non-distended with normoactive bowel sounds. No hepatomegaly. No rebound/guarding. No obvious abdominal masses.Urostomy bag present. Musculoskeletal:  Strength and tone normal for age. Extremities/Skin: Warm and dry. There is beefy red erythema in groin crease bilaterally. Neuro: Alert and oriented X 3. Moves all extremities spontaneously. Gait is normal. CNII-XII grossly in tact. Psych:  Responds to questions appropriately with a normal affect.   Results for orders placed or performed in visit on 07/23/16  Urinalysis, Routine w reflex microscopic (not at [facility])  Result Value Ref Range   Color, Urine DARK YELLOW YELLOW   APPearance CLOUDY (A) CLEAR   Specific Gravity, Urine 1.015 1.001 - 1.035   pH 7.5 5.0 - 8.0   Glucose, UA NEGATIVE NEGATIVE   Bilirubin Urine NEGATIVE NEGATIVE   Ketones, ur NEGATIVE NEGATIVE   Hgb  urine dipstick 2+ (A) NEGATIVE   Protein, ur 1+ (A) NEGATIVE   Nitrite POSITIVE (A) NEGATIVE   Leukocytes, UA 3+ (A) NEGATIVE  Urine Microscopic  Result Value Ref Range   WBC, UA 10-20 (A) <=5  WBC/HPF   RBC / HPF 3-10 (A) <=2 RBC/HPF   Squamous Epithelial / LPF NONE SEEN <=5 HPF   Bacteria, UA MANY (A) NONE SEEN HPF   Crystals MODERATE (A) NONE SEEN HPF   Casts NONE SEEN NONE SEEN LPF   Yeast NONE SEEN NONE SEEN HPF     ASSESSMENT AND PLAN:  75 y.o. year old female with  1. Presence of urostomy ([place]) Urinalysis consistent with infection. Will go ahead and start Cipro and will send culture. Will f/u with them once we get culture results. - ciprofloxacin (CIPRO) 500 MG tablet; Take 1 tablet (500 mg total) by mouth 2 (two) times daily.  Dispense: 20 tablet; Refill: 0 - Urine culture  2. Urinary tract infection with hematuria, site unspecified Urinalysis consistent with infection. Will go ahead and start Cipro and will send culture. Will f/u with them once we get culture results. - ciprofloxacin (CIPRO) 500 MG tablet; Take 1 tablet (500 mg total) by mouth 2 (two) times daily.  Dispense: 20 tablet; Refill: 0 - Urine culture  3. Cutaneous candidiasis Have her take 1 oral Diflucan and have them apply the nystatin cream to affected area. Discussed with husband the cause of this being moisture and to try to dry in these sites as well as possible can keep as dry as possible. - fluconazole (DIFLUCAN) 150 MG tablet; Take 1 tablet (150 mg total) by mouth once.  Dispense: 1 tablet; Refill: 0 - nystatin cream (MYCOSTATIN); Apply 1 application topically 2 (two) times daily.  Dispense: 30 g; Refill: 0  4. Bad odor of urine - Urinalysis, Routine w reflex microscopic (not at [facility])  5. Dementia   Signed, Olean Ree [place], [place], [facility] 07/23/2016 1:12 PM

## 2016-07-24 DIAGNOSIS — H18413 Arcus senilis, bilateral: Secondary | ICD-10-CM | POA: Diagnosis not present

## 2016-07-27 LAB — URINE CULTURE

## 2016-08-10 ENCOUNTER — Other Ambulatory Visit: Payer: Self-pay | Admitting: Family Medicine

## 2016-08-10 DIAGNOSIS — I10 Essential (primary) hypertension: Secondary | ICD-10-CM

## 2016-08-19 DIAGNOSIS — K7460 Unspecified cirrhosis of liver: Secondary | ICD-10-CM | POA: Diagnosis not present

## 2016-08-19 DIAGNOSIS — R932 Abnormal findings on diagnostic imaging of liver and biliary tract: Secondary | ICD-10-CM | POA: Diagnosis not present

## 2016-08-20 ENCOUNTER — Other Ambulatory Visit: Payer: Self-pay | Admitting: Gastroenterology

## 2016-08-20 DIAGNOSIS — R932 Abnormal findings on diagnostic imaging of liver and biliary tract: Secondary | ICD-10-CM

## 2016-08-20 DIAGNOSIS — K7460 Unspecified cirrhosis of liver: Secondary | ICD-10-CM

## 2016-09-02 ENCOUNTER — Ambulatory Visit
Admission: RE | Admit: 2016-09-02 | Discharge: 2016-09-02 | Disposition: A | Discharge status: Home / Self Care | Payer: Medicare Other | Source: Ambulatory Visit | Attending: Gastroenterology | Admitting: Gastroenterology

## 2016-09-02 DIAGNOSIS — K7460 Unspecified cirrhosis of liver: Secondary | ICD-10-CM | POA: Diagnosis not present

## 2016-09-02 DIAGNOSIS — R932 Abnormal findings on diagnostic imaging of liver and biliary tract: Secondary | ICD-10-CM

## 2016-09-02 MED ORDER — GADOXETATE DISODIUM 0.25 MMOL/ML IV SOLN
10.0000 mL | Freq: Once | INTRAVENOUS | Status: AC | PRN
  Administered 2016-09-02: 10 mL via INTRAVENOUS

## 2016-09-09 ENCOUNTER — Ambulatory Visit (HOSPITAL_BASED_OUTPATIENT_CLINIC_OR_DEPARTMENT_OTHER): Payer: Medicare Other | Admitting: Family

## 2016-09-09 ENCOUNTER — Other Ambulatory Visit (HOSPITAL_BASED_OUTPATIENT_CLINIC_OR_DEPARTMENT_OTHER): Payer: Medicare Other

## 2016-09-09 ENCOUNTER — Ambulatory Visit (HOSPITAL_BASED_OUTPATIENT_CLINIC_OR_DEPARTMENT_OTHER): Payer: Medicare Other

## 2016-09-09 VITALS — BP 150/65 | HR 53 | Temp 98.0°F | Resp 20 | Wt 241.1 lb

## 2016-09-09 DIAGNOSIS — C189 Malignant neoplasm of colon, unspecified: Secondary | ICD-10-CM

## 2016-09-09 DIAGNOSIS — Z8551 Personal history of malignant neoplasm of bladder: Secondary | ICD-10-CM

## 2016-09-09 DIAGNOSIS — D509 Iron deficiency anemia, unspecified: Secondary | ICD-10-CM | POA: Diagnosis not present

## 2016-09-09 DIAGNOSIS — D6959 Other secondary thrombocytopenia: Secondary | ICD-10-CM

## 2016-09-09 DIAGNOSIS — C679 Malignant neoplasm of bladder, unspecified: Secondary | ICD-10-CM

## 2016-09-09 DIAGNOSIS — D708 Other neutropenia: Secondary | ICD-10-CM

## 2016-09-09 DIAGNOSIS — K7581 Nonalcoholic steatohepatitis (NASH): Secondary | ICD-10-CM

## 2016-09-09 DIAGNOSIS — D696 Thrombocytopenia, unspecified: Secondary | ICD-10-CM

## 2016-09-09 DIAGNOSIS — K7460 Unspecified cirrhosis of liver: Secondary | ICD-10-CM

## 2016-09-09 DIAGNOSIS — Z23 Encounter for immunization: Secondary | ICD-10-CM | POA: Diagnosis present

## 2016-09-09 DIAGNOSIS — Z85038 Personal history of other malignant neoplasm of large intestine: Secondary | ICD-10-CM | POA: Diagnosis not present

## 2016-09-09 LAB — CBC WITH DIFFERENTIAL (CANCER CENTER ONLY)
BASO#: 0 10*3/uL (ref 0.0–0.2)
BASO%: 0.3 % (ref 0.0–2.0)
EOS ABS: 0.1 10*3/uL (ref 0.0–0.5)
EOS%: 3.2 % (ref 0.0–7.0)
HEMATOCRIT: 34.9 % (ref 34.8–46.6)
HGB: 11.6 g/dL (ref 11.6–15.9)
LYMPH#: 0.7 10*3/uL — AB (ref 0.9–3.3)
LYMPH%: 23.5 % (ref 14.0–48.0)
MCH: 29.8 pg (ref 26.0–34.0)
MCHC: 33.2 g/dL (ref 32.0–36.0)
MCV: 90 fL (ref 81–101)
MONO#: 0.4 10*3/uL (ref 0.1–0.9)
MONO%: 11.6 % (ref 0.0–13.0)
NEUT%: 61.4 % (ref 39.6–80.0)
NEUTROS ABS: 1.9 10*3/uL (ref 1.5–6.5)
Platelets: 71 10*3/uL — ABNORMAL LOW (ref 145–400)
RBC: 3.89 10*6/uL (ref 3.70–5.32)
RDW: 16.6 % — ABNORMAL HIGH (ref 11.1–15.7)
WBC: 3.1 10*3/uL — AB (ref 3.9–10.0)

## 2016-09-09 LAB — CHCC SATELLITE - SMEAR

## 2016-09-09 LAB — FERRITIN: FERRITIN: 13 ng/mL (ref 9–269)

## 2016-09-09 LAB — IRON AND TIBC
%SAT: 25 % (ref 21–57)
Iron: 78 ug/dL (ref 41–142)
TIBC: 316 ug/dL (ref 236–444)
UIBC: 238 ug/dL (ref 120–384)

## 2016-09-09 MED ORDER — INFLUENZA VAC SPLIT QUAD 0.5 ML IM SUSY
0.5000 mL | PREFILLED_SYRINGE | Freq: Once | INTRAMUSCULAR | Status: AC
  Filled 2016-09-09: qty 0.5

## 2016-09-09 MED ORDER — INFLUENZA VAC SPLIT QUAD 0.5 ML IM SUSY
0.5000 mL | PREFILLED_SYRINGE | Freq: Once | INTRAMUSCULAR | Status: AC
  Administered 2016-09-09: 0.5 mL via INTRAMUSCULAR
  Filled 2016-09-09: qty 0.5

## 2016-09-09 NOTE — Patient Instructions (Signed)
Influenza Virus Vaccine injection (Fluarix) What is this medicine? INFLUENZA VIRUS VACCINE (in floo EN zuh VAHY ruhs vak SEEN) helps to reduce the risk of getting influenza also known as the flu. This medicine may be used for other purposes; ask your health care provider or pharmacist if you have questions. COMMON BRAND NAME(S): Fluarix, Fluzone What should I tell my health care provider before I take this medicine? They need to know if you have any of these conditions: -bleeding disorder like hemophilia -fever or infection -Guillain-Barre syndrome or other neurological problems -immune system problems -infection with the human immunodeficiency virus (HIV) or AIDS -low blood platelet counts -multiple sclerosis -an unusual or allergic reaction to influenza virus vaccine, eggs, chicken proteins, latex, gentamicin, other medicines, foods, dyes or preservatives -pregnant or trying to get pregnant -breast-feeding How should I use this medicine? This vaccine is for injection into a muscle. It is given by a health care professional. A copy of Vaccine Information Statements will be given before each vaccination. Read this sheet carefully each time. The sheet may change frequently. Talk to your pediatrician regarding the use of this medicine in children. Special care may be needed. Overdosage: If you think you have taken too much of this medicine contact a poison control center or emergency room at once. NOTE: This medicine is only for you. Do not share this medicine with others. What if I miss a dose? This does not apply. What may interact with this medicine? -chemotherapy or radiation therapy -medicines that lower your immune system like etanercept, anakinra, infliximab, and adalimumab -medicines that treat or prevent blood clots like warfarin -phenytoin -steroid medicines like prednisone or cortisone -theophylline -vaccines This list may not describe all possible interactions. Give your  health care provider a list of all the medicines, herbs, non-prescription drugs, or dietary supplements you use. Also tell them if you smoke, drink alcohol, or use illegal drugs. Some items may interact with your medicine. What should I watch for while using this medicine? Report any side effects that do not go away within 3 days to your doctor or health care professional. Call your health care provider if any unusual symptoms occur within 6 weeks of receiving this vaccine. You may still catch the flu, but the illness is not usually as bad. You cannot get the flu from the vaccine. The vaccine will not protect against colds or other illnesses that may cause fever. The vaccine is needed every year. What side effects may I notice from receiving this medicine? Side effects that you should report to your doctor or health care professional as soon as possible: -allergic reactions like skin rash, itching or hives, swelling of the face, lips, or tongue Side effects that usually do not require medical attention (report to your doctor or health care professional if they continue or are bothersome): -fever -headache -muscle aches and pains -pain, tenderness, redness, or swelling at site where injected -weak or tired This list may not describe all possible side effects. Call your doctor for medical advice about side effects. You may report side effects to FDA at 1-800-FDA-1088. Where should I keep my medicine? This vaccine is only given in a clinic, pharmacy, doctor's office, or other health care setting and will not be stored at home. NOTE: This sheet is a summary. It may not cover all possible information. If you have questions about this medicine, talk to your doctor, pharmacist, or health care provider.  2017 Elsevier/Gold Standard (2008-04-05 09:30:40)

## 2016-09-09 NOTE — Progress Notes (Signed)
Hematology and Oncology Follow Up Visit  [name] [id number] 1941-05-09 75 y.o. 09/09/2016   Principle Diagnosis:  1. Recurrent iron deficiency anemia. 2. 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 3. Remote history of stage II (T3, N0, M0) adenocarcinoma of colon. 4. 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 She is pleasantly confused at this time so most information was collected from her husband. She is doing well and has no new issues at this time. She still has some mild fatigue.  She would like a flu shot today.  No fever, chills, n/v, cough, rash, dizziness, SOB, chest pain, palpitations or changes in bowel habits. Her urostomy continues to function appropriately.  She has occasional right upper quadrant pain secondary to cirrhosis. She had an MRI of the abdomen with Dr. [name] which showed liver lesion was stable. She did have splenomegaly.  No episodes of bleeding, bruising or petechiae.  No lymphadenopathy found on exam.  No swelling, tenderness, numbness or tingling in her extremities. She has a good appetite and is staying hydrated. She still drinks a chocolate boost every day. Her weight is stable.  Medications:  Allergies as of 09/09/2016      Reactions   Codeine Swelling, Rash   Swelling in throat   Morphine And Related Swelling, Rash   Other Anaphylaxis   Bee Stings   Phytonadione Other (See Comments)   Patient experienced episode of cyanosis following dose of Vitamin K 10 mg IV.   Meat Extract    Patient doesn't eat meat    Promethazine Hcl    Per MD chart - 08/08/11   Penicillins Rash      Medication List       Accurate as of 09/09/16 11:36 AM. Always use your most recent med list.          acetaminophen 500 MG  tablet Commonly known as:  TYLENOL Take 1,000 mg by mouth every 6 (six) hours as needed for mild pain.   Cinnamon 500 MG capsule Take 500 mg by mouth daily.   citalopram 40 MG tablet Commonly known as:  CELEXA TAKE 1 TABLET BY MOUTH EVERY DAY   fish oil-omega-3 fatty acids 1000 MG capsule Take 1 g by mouth daily.   Ginkgo Biloba 60 MG Tabs Take by mouth every morning.   losartan 25 MG tablet Commonly known as:  COZAAR TAKE 1 TABLET BY MOUTH EVERY DAY, STOP RAMIPRIL   multivitamin with minerals Tabs tablet Take 1 tablet by mouth daily.   nystatin cream Commonly known as:  MYCOSTATIN Apply 1 application topically 2 (two) times daily.   pantoprazole 40 MG tablet Commonly known as:  PROTONIX Take 1 tablet (40 mg total) by mouth daily.   potassium chloride 10 MEQ tablet Commonly known as:  K-DUR Take 1 tablet (10 mEq total) by mouth daily.   VITAMIN B 12 PO Take 1 tablet by mouth every morning.   vitamin C 1000 MG tablet Take 1,000 mg by mouth daily.   Vitamin D3 1000 units Caps Take 1 capsule by mouth daily.   vitamin E 100 UNIT capsule Take by mouth daily.       Allergies:  Allergies  Allergen Reactions  . Codeine Swelling and Rash    Swelling in throat  . Morphine And  Related Swelling and Rash  . Other Anaphylaxis    Bee Stings  . Phytonadione Other (See Comments)    Patient experienced episode of cyanosis following dose of Vitamin K 10 mg IV.  . Meat Extract     Patient doesn't eat meat   . Promethazine Hcl     Per MD chart - 08/08/11  . Penicillins Rash    Past Medical History, Surgical history, Social history, and Family History were reviewed and updated.  Review of Systems: All other 10 point review of systems is negative.   Physical Exam:  vitals were not taken for this visit.  Wt Readings from Last 3 Encounters:  07/23/16 242 lb (109.8 kg)  07/11/16 242 lb (109.8 kg)  05/08/16 241 lb (109.3 kg)    Ocular: Sclerae unicteric, pupils  equal, round and reactive to light Ear-nose-throat: Oropharynx clear, dentition fair Lymphatic: No cervical supraclavicular or axillary adenopathy Lungs no rales or rhonchi, good excursion bilaterally Heart regular rate and rhythm, no murmur appreciated Abd soft, nontender, positive bowel sounds, no liver or spleen tip palpated on exam, no fluid wave MSK no focal spinal tenderness, no joint edema Neuro: non-focal, well-oriented, appropriate affect Breasts: Deferred  Lab Results  Component Value Date   WBC 3.4 (L) 05/08/2016   HGB 12.0 05/08/2016   HCT 35.5 05/08/2016   MCV 91 05/08/2016   PLT 66 (L) 05/08/2016   Lab Results  Component Value Date   FERRITIN 13 05/08/2016   IRON 92 05/08/2016   TIBC 306 05/08/2016   UIBC 214 05/08/2016   IRONPCTSAT 30 05/08/2016   Lab Results  Component Value Date   RETICCTPCT 2.0 11/23/2014   RBC 3.89 05/08/2016   RETICCTABS 74.4 11/23/2014   No results found for: KPAFRELGTCHN, LAMBDASER, KAPLAMBRATIO No results found for: IGGSERUM, IGA, IGMSERUM No results found for: Kathrynn Ducking, MSPIKE, SPEI   Chemistry      Component Value Date/Time   NA 142 07/11/2016 1100   NA 143 07/01/2010 0949   K 3.3 (L) 07/11/2016 1100   K 4.2 07/01/2010 0949   CL 105 07/11/2016 1100   CL 99 07/01/2010 0949   CO2 31 07/11/2016 1100   CO2 33 07/01/2010 0949   BUN 13 07/11/2016 1100   BUN 13 07/01/2010 0949   CREATININE 0.59 (L) 07/11/2016 1100      Component Value Date/Time   CALCIUM 8.6 07/11/2016 1100   CALCIUM 9.4 07/01/2010 0949   ALKPHOS 78 07/11/2016 1100   ALKPHOS 87 (H) 07/01/2010 0949   AST 27 07/11/2016 1100   AST 24 07/01/2010 0949   ALT 14 07/11/2016 1100   ALT 19 07/01/2010 0949   BILITOT 1.9 (H) 07/11/2016 1100   BILITOT 1.60 07/01/2010 0949     Impression and Plan: [name] is 75 yo white female with iron deficiency anemia and chronic leukopenia/thrombocytopenia secondary to  cirrhosis. She has chronic fatigue and occasional right upper quadrant pain with her cirrhosis. No issue with infections. WBC count is stable at 3.1. We will see what her iron studies show and bring her back in next week for an infusion if needed.  She did get her flu shot.  We will plan to see her back in 4 months for labs and follow-up.  Her husband will contact us with any questions or concerns. We can certainly see her sooner if need be.   Eliezer Bottom, NP 12/19/201711:36 AM

## 2016-09-10 ENCOUNTER — Telehealth: Payer: Self-pay | Admitting: *Deleted

## 2016-09-10 ENCOUNTER — Telehealth: Payer: Self-pay | Admitting: Family Medicine

## 2016-09-10 NOTE — Telephone Encounter (Addendum)
Patient aware of results  ----- Message from Eliezer Bottom, NP sent at 09/09/2016  3:00 PM EST ----- Regarding: Iron  Iron looks good!  [name]  ----- Message ----- From: Interface, Lab In Three Zero One Sent: 09/09/2016  11:55 AM To: Eliezer Bottom, NP

## 2016-09-10 NOTE — Telephone Encounter (Signed)
Calling about a bill received for labs  [phone]

## 2016-09-11 ENCOUNTER — Encounter: Payer: Self-pay | Admitting: Family Medicine

## 2016-09-11 DIAGNOSIS — I10 Essential (primary) hypertension: Secondary | ICD-10-CM | POA: Insufficient documentation

## 2016-09-23 NOTE — Telephone Encounter (Signed)
I called and spoke with patient's husband since patient was still int he bed and he had the information for the bills. They were both for DOS 07/11/2016 one billed amount was $55.00 [id number] and one was for $20.55 [id number]. The only DX code used was Routine General Medical Examination Please supply with more DX codes so I can call and have this resubmitted for patient.

## 2016-09-23 NOTE — Telephone Encounter (Signed)
Try diagnosis History of colon can Cirrhosis Ess htn Anemia thrombocytopenia

## 2016-09-30 NOTE — Telephone Encounter (Signed)
I have contacted [facility] and spoke with Asencion Partridge this has been resubmitted with the new dx codes. I have also called and advised husband that the bill has been resubmitted.

## 2016-10-14 ENCOUNTER — Ambulatory Visit (INDEPENDENT_AMBULATORY_CARE_PROVIDER_SITE_OTHER): Payer: Medicare Other | Admitting: Family Medicine

## 2016-10-14 ENCOUNTER — Encounter: Payer: Self-pay | Admitting: Family Medicine

## 2016-10-14 VITALS — BP 118/74 | HR 64 | Temp 98.4°F | Resp 20 | Ht 65.0 in | Wt 240.0 lb

## 2016-10-14 DIAGNOSIS — M545 Low back pain, unspecified: Secondary | ICD-10-CM

## 2016-10-14 DIAGNOSIS — R059 Cough, unspecified: Secondary | ICD-10-CM

## 2016-10-14 DIAGNOSIS — R05 Cough: Secondary | ICD-10-CM

## 2016-10-14 DIAGNOSIS — L299 Pruritus, unspecified: Secondary | ICD-10-CM | POA: Diagnosis not present

## 2016-10-14 MED ORDER — TRIAMCINOLONE ACETONIDE 0.1 % EX CREA: 1.0000 "application " | TOPICAL_CREAM | Freq: Two times a day (BID) | CUTANEOUS | 0 refills | Status: DC

## 2016-10-14 MED ORDER — PREDNISONE 20 MG PO TABS: ORAL_TABLET | ORAL | 0 refills | Status: DC

## 2016-10-14 NOTE — Progress Notes (Signed)
Subjective:    Patient ID: [name], female    DOB: 04-12-1941, 76 y.o.   MRN: [id number]  HPI  Patient presents with 3-4 days of cough. Husband states that she is having some subjective fevers although nothing serious. Cough is nonproductive. Pulmonary exam today is completely normal. She denies any chest pain. She denies any shortness of breath. She denies any pleurisy. Lungs are completely clear to auscultation. Her biggest concern is low back pain. Pain is centered at the base of her lumbar spine around the level of L5. She is tender to palpation in that area. The patient had an MRI of the abdomen performed December which were did reveal edema in the intervertebral disc between L4-L5. At that time the radiologist was not concern about discitis. I believe this could represent inflammation in the intervertebral disc that could be the cause of her pain. She denies any sciatica at the present time. She denies any numbness or tingling in her legs. She denies any cauda equina syndrome. She also reports itching. The itching occurs at random places all over her body. She has Alzheimer's disease. She will constantly scratch and pick at the seborrheic keratoses on her back and on her skin until they bleed and become irritated. There is no visible rash. 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 Anemia, iron deficiency    iron injection about 6wks ago  . Arthritis   . Attention to urostomy [facility])    urostomy d/t hx bladder cancer  . Bladder cancer ([place]) 2003   bladder removed.  [name]  . Blood dyscrasia    thromboctopenia  . Blood transfusion   . Cataract immature   . Cirrhosis ([place]) 08/26/2011  . Colon cancer [facility]) 2001   hemicolectomy [name]  . Complication of anesthesia    confusion x 2 to 3 days  . Constipation   . Depression    takes Celexa daily  . GERD (gastroesophageal reflux  disease)    takes Protonix daily  . Headache(784.0)    occasionally  . Hemorrhoids   . History of blood clots 50+yrs ago   legs when she was pregnancy  . History of colon cancer   . History of colon polyps   . History of GI bleed   . History of kidney stones   . Hx of transfusion of whole blood   . Hypertension    takes Ramipril and Metoprolol daily  . Joint pain   . Joint swelling   . Memory loss    d/t chemotherapy;short and long term;takes Aricept daily  . Osteoporosis    takes Vit D3 daily  . Thrombocytopenia ([place])    Past Surgical History:  Procedure Laterality Date  . ABDOMINAL HYSTERECTOMY    . APPENDECTOMY    . BLADDER SURGERY     bladder removed d/t cancer  . CHOLECYSTECTOMY    . COLON RESECTION     with colostomy then colostomy reversal  . COLON SURGERY  2004  . COLON SURGERY  2001  . COLONOSCOPY    . ESOPHAGOGASTRODUODENOSCOPY  02/27/2012   Procedure: ESOPHAGOGASTRODUODENOSCOPY (EGD);  Surgeon: [name], MD;  Location: [name];  Service: Endoscopy;  Laterality: N/A;  bedside case  . NEPHROLITHOTOMY Left 05/04/2014   Procedure: LEFT PERCUTANEOUS NEPHROLITHOTOMY ;  Surgeon: [name], MD;  Location: [facility];  Service: Urology;  Laterality: Left;  . TOTAL  KNEE ARTHROPLASTY  2010   Left knee   Current Outpatient Prescriptions on File Prior to Visit  Medication Sig Dispense Refill  . acetaminophen (TYLENOL) 500 MG tablet Take 1,000 mg by mouth every 6 (six) hours as needed for mild pain.    . Ascorbic Acid (VITAMIN C) 1000 MG tablet Take 1,000 mg by mouth daily.    . Cholecalciferol (VITAMIN D3) 1000 UNITS CAPS Take 1 capsule by mouth daily.      . Cinnamon 500 MG capsule Take 500 mg by mouth daily.     . citalopram (CELEXA) 40 MG tablet TAKE 1 TABLET BY MOUTH EVERY DAY 90 tablet 1  . Cyanocobalamin (VITAMIN B 12 PO) Take 1 tablet by mouth every morning.     . fish oil-omega-3 fatty acids 1000 MG capsule Take 1 g by mouth daily.     . Ginkgo Biloba 60  MG TABS Take by mouth every morning.     Marland Kitchen losartan (COZAAR) 25 MG tablet TAKE 1 TABLET BY MOUTH EVERY DAY, STOP RAMIPRIL 30 tablet 11  . Multiple Vitamin (MULTIVITAMIN WITH MINERALS) TABS tablet Take 1 tablet by mouth daily.    Marland Kitchen nystatin cream (MYCOSTATIN) Apply 1 application topically 2 (two) times daily. 30 g 0  . pantoprazole (PROTONIX) 40 MG tablet Take 1 tablet (40 mg total) by mouth daily. 30 tablet 11  . potassium chloride (K-DUR) 10 MEQ tablet Take 1 tablet (10 mEq total) by mouth daily. 5 tablet 0  . vitamin E 100 UNIT capsule Take by mouth daily.     No current facility-administered medications on file prior to visit.    Allergies  Allergen Reactions  . Codeine Swelling and Rash    Swelling in throat  . Morphine And Related Swelling and Rash  . Other Anaphylaxis    Bee Stings  . Phytonadione Other (See Comments)    Patient experienced episode of cyanosis following dose of Vitamin K 10 mg IV.  . Meat Extract     Patient doesn't eat meat   . Promethazine Hcl     Per MD chart - 08/08/11  . Penicillins Rash   Social History   Social History  . Marital status: Married    Spouse name: N/A  . Number of children: N/A  . Years of education: N/A   Occupational History  . Not on file.   Social History Main Topics  . Smoking status: Former Smoker    Packs/day: 0.50    Years: 35.00    Types: Cigarettes    Start date: 10/25/1955    Quit date: 09/22/1991  . Smokeless tobacco: Never Used     Comment: quit 23years ago  . Alcohol use No  . Drug use: No  . Sexual activity: Yes    Birth control/ protection: Surgical   Other Topics Concern  . Not on file   Social History Narrative   Lives at home with her husband, ambulatory     Review of Systems  Genitourinary: Negative for hematuria.  Musculoskeletal: Positive for back pain. Negative for gait problem, joint swelling, myalgias, neck pain and neck stiffness.  All other systems reviewed and are negative.        Objective:   Physical Exam  Cardiovascular: Normal rate, regular rhythm and normal heart sounds.   No murmur heard. Pulmonary/Chest: Effort normal and breath sounds normal. No respiratory distress. She has no wheezes. She has no rales.  Abdominal: Soft. Bowel sounds are normal.  Musculoskeletal:  Lumbar back: She exhibits decreased range of motion, tenderness and pain.          Assessment & Plan:  Left-sided low back pain without sciatica, unspecified chronicity - Plan: predniSONE (DELTASONE) 20 MG tablet, triamcinolone cream (KENALOG) 0.1 %  Itching  Cough  Patient is tender to palpation over the left lumbar paraspinal muscles approximately level of L4-L5. There is no erythema or warmth in this area. She is also complaining of pain in this area and this exact same area where edema was seen within the intervertebral disc on MRI performed in December. I will treat the patient is possibly having herniated disc with prednisone taper pack. Recheck in one week if no better or sooner if worse. I believe the cough is likely an upper respiratory infection. I recommended Mucinex DM and tincture of time for the cough. 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 They can certainly use triamcinolone cream up to twice a day as needed for itching. The husband can also try barrier cream such as Vaseline. Recheck in one week or sooner if worse

## 2016-11-01 ENCOUNTER — Encounter (HOSPITAL_COMMUNITY): Payer: Self-pay | Admitting: Emergency Medicine

## 2016-11-01 ENCOUNTER — Inpatient Hospital Stay (HOSPITAL_COMMUNITY)
Admission: EM | Admit: 2016-11-01 | Discharge: 2016-11-04 | DRG: 871 | Disposition: A | Discharge status: Home / Self Care | Payer: Medicare Other | Attending: Internal Medicine | Admitting: Internal Medicine

## 2016-11-01 DIAGNOSIS — Z936 Other artificial openings of urinary tract status: Secondary | ICD-10-CM

## 2016-11-01 DIAGNOSIS — R319 Hematuria, unspecified: Secondary | ICD-10-CM

## 2016-11-01 DIAGNOSIS — Z85038 Personal history of other malignant neoplasm of large intestine: Secondary | ICD-10-CM

## 2016-11-01 DIAGNOSIS — N390 Urinary tract infection, site not specified: Secondary | ICD-10-CM | POA: Diagnosis present

## 2016-11-01 DIAGNOSIS — G9340 Encephalopathy, unspecified: Secondary | ICD-10-CM | POA: Diagnosis not present

## 2016-11-01 DIAGNOSIS — Z87891 Personal history of nicotine dependence: Secondary | ICD-10-CM

## 2016-11-01 DIAGNOSIS — I10 Essential (primary) hypertension: Secondary | ICD-10-CM | POA: Diagnosis present

## 2016-11-01 DIAGNOSIS — E876 Hypokalemia: Secondary | ICD-10-CM | POA: Diagnosis present

## 2016-11-01 DIAGNOSIS — R55 Syncope and collapse: Secondary | ICD-10-CM | POA: Diagnosis present

## 2016-11-01 DIAGNOSIS — D509 Iron deficiency anemia, unspecified: Secondary | ICD-10-CM | POA: Diagnosis present

## 2016-11-01 DIAGNOSIS — Z66 Do not resuscitate: Secondary | ICD-10-CM | POA: Diagnosis present

## 2016-11-01 DIAGNOSIS — Z96659 Presence of unspecified artificial knee joint: Secondary | ICD-10-CM | POA: Diagnosis present

## 2016-11-01 DIAGNOSIS — Z933 Colostomy status: Secondary | ICD-10-CM

## 2016-11-01 DIAGNOSIS — F0390 Unspecified dementia without behavioral disturbance: Secondary | ICD-10-CM | POA: Diagnosis not present

## 2016-11-01 DIAGNOSIS — M810 Age-related osteoporosis without current pathological fracture: Secondary | ICD-10-CM | POA: Diagnosis present

## 2016-11-01 DIAGNOSIS — D61818 Other pancytopenia: Secondary | ICD-10-CM | POA: Diagnosis present

## 2016-11-01 DIAGNOSIS — F0391 Unspecified dementia with behavioral disturbance: Secondary | ICD-10-CM | POA: Diagnosis present

## 2016-11-01 DIAGNOSIS — I4510 Unspecified right bundle-branch block: Secondary | ICD-10-CM | POA: Diagnosis present

## 2016-11-01 DIAGNOSIS — R404 Transient alteration of awareness: Secondary | ICD-10-CM | POA: Diagnosis not present

## 2016-11-01 DIAGNOSIS — F05 Delirium due to known physiological condition: Secondary | ICD-10-CM | POA: Diagnosis not present

## 2016-11-01 DIAGNOSIS — Z8551 Personal history of malignant neoplasm of bladder: Secondary | ICD-10-CM

## 2016-11-01 DIAGNOSIS — Z888 Allergy status to other drugs, medicaments and biological substances status: Secondary | ICD-10-CM

## 2016-11-01 DIAGNOSIS — K7460 Unspecified cirrhosis of liver: Secondary | ICD-10-CM | POA: Diagnosis present

## 2016-11-01 DIAGNOSIS — R410 Disorientation, unspecified: Secondary | ICD-10-CM | POA: Diagnosis not present

## 2016-11-01 DIAGNOSIS — F329 Major depressive disorder, single episode, unspecified: Secondary | ICD-10-CM | POA: Diagnosis present

## 2016-11-01 DIAGNOSIS — C679 Malignant neoplasm of bladder, unspecified: Secondary | ICD-10-CM | POA: Diagnosis present

## 2016-11-01 DIAGNOSIS — A419 Sepsis, unspecified organism: Principal | ICD-10-CM | POA: Diagnosis present

## 2016-11-01 DIAGNOSIS — K219 Gastro-esophageal reflux disease without esophagitis: Secondary | ICD-10-CM | POA: Diagnosis present

## 2016-11-01 DIAGNOSIS — D696 Thrombocytopenia, unspecified: Secondary | ICD-10-CM | POA: Diagnosis present

## 2016-11-01 DIAGNOSIS — Z79899 Other long term (current) drug therapy: Secondary | ICD-10-CM

## 2016-11-01 DIAGNOSIS — R4182 Altered mental status, unspecified: Secondary | ICD-10-CM | POA: Diagnosis not present

## 2016-11-01 DIAGNOSIS — M1990 Unspecified osteoarthritis, unspecified site: Secondary | ICD-10-CM | POA: Diagnosis present

## 2016-11-01 DIAGNOSIS — D72819 Decreased white blood cell count, unspecified: Secondary | ICD-10-CM | POA: Diagnosis present

## 2016-11-01 DIAGNOSIS — Z9221 Personal history of antineoplastic chemotherapy: Secondary | ICD-10-CM

## 2016-11-01 DIAGNOSIS — Z880 Allergy status to penicillin: Secondary | ICD-10-CM

## 2016-11-01 HISTORY — DX: Other specified postprocedural states: Z98.890

## 2016-11-01 HISTORY — DX: Personal history of other venous thrombosis and embolism: Z86.718

## 2016-11-01 LAB — CBC
HCT: 32 % — ABNORMAL LOW (ref 36.0–46.0)
Hemoglobin: 10.6 g/dL — ABNORMAL LOW (ref 12.0–15.0)
MCH: 28.7 pg (ref 26.0–34.0)
MCHC: 33.1 g/dL (ref 30.0–36.0)
MCV: 86.7 fL (ref 78.0–100.0)
PLATELETS: 46 10*3/uL — AB (ref 150–400)
RBC: 3.69 MIL/uL — ABNORMAL LOW (ref 3.87–5.11)
RDW: 17.6 % — AB (ref 11.5–15.5)
WBC: 2.8 10*3/uL — ABNORMAL LOW (ref 4.0–10.5)

## 2016-11-01 LAB — BASIC METABOLIC PANEL
Anion gap: 4 — ABNORMAL LOW (ref 5–15)
BUN: 15 mg/dL (ref 6–20)
CALCIUM: 8.8 mg/dL — AB (ref 8.9–10.3)
CO2: 28 mmol/L (ref 22–32)
CREATININE: 0.74 mg/dL (ref 0.44–1.00)
Chloride: 105 mmol/L (ref 101–111)
GFR calc non Af Amer: >60 mL/min (ref >60)
GLUCOSE: 143 mg/dL — AB (ref 65–99)
Potassium: 3 mmol/L — ABNORMAL LOW (ref 3.5–5.1)
Sodium: 137 mmol/L (ref 135–145)

## 2016-11-01 LAB — URINALYSIS, ROUTINE W REFLEX MICROSCOPIC
Bilirubin Urine: NEGATIVE
Glucose, UA: NEGATIVE mg/dL
HGB URINE DIPSTICK: NEGATIVE
KETONES UR: NEGATIVE mg/dL
Nitrite: NEGATIVE
PROTEIN: NEGATIVE mg/dL
SQUAMOUS EPITHELIAL / LPF: NONE SEEN
Specific Gravity, Urine: 1.014 (ref 1.005–1.030)
pH: 7 (ref 5.0–8.0)

## 2016-11-01 LAB — CBG MONITORING, ED: GLUCOSE-CAPILLARY: 155 mg/dL — AB (ref 65–99)

## 2016-11-01 NOTE — ED Notes (Signed)
Bed: MH68 Expected date:  Expected time:  Means of arrival:  Comments: EMS

## 2016-11-01 NOTE — ED Provider Notes (Signed)
[facility] [place] DEPT Provider Note   CSN: [id number] Arrival date & time: 11/01/16  2111 By signing my name below, I, Dyke Brackett, attest that this documentation has been prepared under the direction and in the presence of Micheil Klaus, MD . Electronically Signed: Dyke Brackett, Scribe. 11/01/2016. 11:37 PM.   History   Chief Complaint Chief Complaint  Patient presents with  . Loss of Consciousness   LEVEL 5 CAVEAT DUE TO AMS  HPI [name] is a 76 y.o. female with a history of bladder cancer, colon cancer, dementia and HTN who presents to the Emergency Department complaining of sudden onset, severe confusion today. He states she was unable to "recognize herself of anyone". Per pt's son, her heart rate suddenly dropped to 48 and she turned pale. Per husband, pt has been running a fever today. He also reports associated back and abdominal pain x 1-2 weeks.   The history is provided by a relative. No language interpreter was used.  Altered Mental Status   This is a new problem. The current episode started 6 to 12 hours ago. The problem has not changed since onset.Associated symptoms include confusion. Pertinent negatives include no weakness and no hallucinations. Risk factors: elderly. Her past medical history is significant for dementia. Her past medical history does not include seizures.    Past Medical History:  Diagnosis Date  . Anemia, iron deficiency    iron injection about 6wks ago  . Arthritis   . Attention to urostomy [facility])    urostomy d/t hx bladder cancer  . Bladder cancer ([place]) 2003   bladder removed.  [name]  . Blood dyscrasia    thromboctopenia  . Blood transfusion   . Cataract immature   . Cirrhosis ([place]) 08/26/2011  . Colon cancer [facility]) 2001   hemicolectomy [name]  . Complication of anesthesia    confusion x 2 to 3 days  . Constipation   . Depression    takes Celexa daily  . GERD (gastroesophageal reflux disease)    takes Protonix daily  .  Headache(784.0)    occasionally  . Hemorrhoids   . History of blood clots 50+yrs ago   legs when she was pregnancy  . History of colon cancer   . History of colon polyps   . History of GI bleed   . History of kidney stones   . Hx of transfusion of whole blood   . Hypertension    takes Ramipril and Metoprolol daily  . Joint pain   . Joint swelling   . Memory loss    d/t chemotherapy;short and long term;takes Aricept daily  . Osteoporosis    takes Vit D3 daily  . Thrombocytopenia [facility])     Patient Active Problem List   Diagnosis Date Noted  . Benign essential HTN 09/11/2016  . Renal stones 05/04/2014  . Pyelonephritis, acute 04/06/2014  . Pyelonephritis 04/06/2014  . Nephrolithiasis 04/06/2014  . Presence of urostomy ([place]) 04/06/2014  . Sepsis ([place]) 02/28/2014  . Severe sepsis ([place]) 02/28/2014  . Hydronephrosis, left 02/28/2014  . E. coli pyelonephritis 02/28/2014  . Leukopenia 03/05/2012  . Varices, esophageal ([place]) 02/29/2012  . Acute upper GI bleeding 02/29/2012  . Gastric ulcer, acute with hemorrhage 02/29/2012  . Dementia 02/26/2012  . 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 Melena 02/26/2012  . Acute blood loss anemia 02/26/2012  . Diarrhea 02/26/2012  . Fracture of humerus 02/26/2012  . Ventral hernia without obstruction or gangrene 01/13/2012  . Colon cancer ([place])  08/26/2011  . Bladder cancer ([place]) 08/26/2011  . Thrombocytopenia ([place]) 08/26/2011  . Anemia, iron deficiency 08/26/2011    Past Surgical History:  Procedure Laterality Date  . ABDOMINAL HYSTERECTOMY    . APPENDECTOMY    . BLADDER SURGERY     bladder removed d/t cancer  . CHOLECYSTECTOMY    . COLON RESECTION     with colostomy then colostomy reversal  . COLON SURGERY  2004  . COLON SURGERY  2001  . COLONOSCOPY    . ESOPHAGOGASTRODUODENOSCOPY  02/27/2012   Procedure: ESOPHAGOGASTRODUODENOSCOPY (EGD);  Surgeon: [name], MD;  Location: [name];  Service: Endoscopy;  Laterality:  N/A;  bedside case  . NEPHROLITHOTOMY Left 05/04/2014   Procedure: LEFT PERCUTANEOUS NEPHROLITHOTOMY ;  Surgeon: [name], MD;  Location: [facility];  Service: Urology;  Laterality: Left;  . TOTAL KNEE ARTHROPLASTY  2010   Left knee    OB History    No data available       Home Medications    Prior to Admission medications   Medication Sig Start Date End Date Taking? Authorizing Provider  acidophilus (RISAQUAD) CAPS capsule Take 1 capsule by mouth daily.   Yes Historical Provider, MD  Ascorbic Acid (VITAMIN C) 1000 MG tablet Take 1,000 mg by mouth daily.   Yes Historical Provider, MD  Cholecalciferol (VITAMIN D3) 1000 UNITS CAPS Take 1,000 Units by mouth daily.    Yes Historical Provider, MD  Cinnamon 500 MG capsule Take 500 mg by mouth daily.    Yes Historical Provider, MD  citalopram (CELEXA) 40 MG tablet TAKE 1 TABLET BY MOUTH EVERY DAY Patient taking differently: TAKE 40 MG BY MOUTH EACH DAY 03/06/16  Yes [name], MD  Cyanocobalamin (VITAMIN B 12 PO) Take 1 tablet by mouth every morning.    Yes Historical Provider, MD  fexofenadine (ALLEGRA) 180 MG tablet Take 180 mg by mouth daily.   Yes Historical Provider, MD  fish oil-omega-3 fatty acids 1000 MG capsule Take 1,200 mg by mouth daily.    Yes Historical Provider, MD  Ginkgo Biloba 60 MG TABS Take 60 mg by mouth every morning.    Yes Historical Provider, MD  guaiFENesin (MUCINEX) 600 MG 12 hr tablet Take 600 mg by mouth 2 (two) times daily as needed for cough or to loosen phlegm.   Yes Historical Provider, MD  losartan (COZAAR) 25 MG tablet TAKE 1 TABLET BY MOUTH EVERY DAY, STOP RAMIPRIL Patient taking differently: TAKE 25 MG BY MOUTH EACH DAY 08/11/16  Yes [name], MD  Multiple Vitamin (MULTIVITAMIN WITH MINERALS) TABS tablet Take 1 tablet by mouth daily.   Yes Historical Provider, MD  pantoprazole (PROTONIX) 40 MG tablet Take 1 tablet (40 mg total) by mouth daily. 07/16/16  Yes [name], MD  propranolol  (INDERAL) 10 MG tablet TAKE 10 MG BY MOUTH DAILY 08/19/16  Yes Historical Provider, MD  triamcinolone cream (KENALOG) 0.1 % Apply 1 application topically 2 (two) times daily. 10/14/16  Yes [name], MD  vitamin E 100 UNIT capsule Take 400 Units by mouth daily.    Yes Historical Provider, MD  nystatin cream (MYCOSTATIN) Apply 1 application topically 2 (two) times daily. Patient not taking: Reported on 11/01/2016 07/23/16   Orlena Sheldon, PA-C  potassium chloride (K-DUR) 10 MEQ tablet Take 1 tablet (10 mEq total) by mouth daily. Patient not taking: Reported on 11/01/2016 07/16/16   [name], MD  predniSONE (DELTASONE) 20 MG tablet  3 tabs poqday 1-2, 2 tabs poqday 3-4, 1 tab poqday 5-6 Patient not taking: Reported on 11/01/2016 10/14/16   [name], MD    Family History Family History  Problem Relation Age of Onset  . Diabetes Brother   . Cancer Other   . Hypertension Other     Social History Social History  Substance Use Topics  . Smoking status: Former Smoker    Packs/day: 0.50    Years: 35.00    Types: Cigarettes    Start date: 10/25/1955    Quit date: 09/22/1991  . Smokeless tobacco: Never Used     Comment: quit 23years ago  . Alcohol use No     Allergies   Codeine; Morphine and related; Other; Phytonadione; Meat extract; Promethazine hcl; and Penicillins   Review of Systems Review of Systems  Unable to perform ROS: Mental status change  Constitutional: Negative for fever.  Genitourinary: Negative for urgency.  Neurological: Negative for syncope, weakness and numbness.  Psychiatric/Behavioral: Positive for confusion. Negative for hallucinations.   Physical Exam Updated Vital Signs BP 164/70 (BP Location: Right Arm)   Pulse (!) 52   Temp 97.8 F (36.6 C) (Oral)   Resp 17   SpO2 100%   Physical Exam  Constitutional: She appears well-developed and well-nourished. No distress.  HENT:  Head: Normocephalic and atraumatic.  Mouth/Throat: Oropharynx  is clear and moist. No oropharyngeal exudate.  Moist mucous membranes   Eyes: Conjunctivae are normal. Pupils are equal, round, and reactive to light.  Cataract lens implants  Neck: Normal range of motion. Neck supple. No JVD present.  Trachea midline No bruit  Cardiovascular: Normal rate, regular rhythm, normal heart sounds and intact distal pulses.   Pulmonary/Chest: Effort normal and breath sounds normal. No stridor. No respiratory distress.  Abdominal: Soft. Bowel sounds are normal. She exhibits no distension and no mass. There is no tenderness. There is no rebound and no guarding.  A lot of stool in colon.   Musculoskeletal: Normal range of motion. She exhibits no edema or tenderness.  All compartments soft  Lymphadenopathy:    She has no cervical adenopathy.  Neurological: She is alert. She has normal reflexes.  Skin: Skin is warm and dry. Capillary refill takes less than 2 seconds.  Psychiatric: She has a normal mood and affect. Her behavior is normal.  Nursing note and vitals reviewed.  ED Treatments / Results   Vitals:   11/01/16 2132 11/02/16 0000  BP: 164/70 153/76  Pulse: (!) 52 (!) 53  Resp: 17 15  Temp: 97.8 F (36.6 C)    Results for orders placed or performed during the hospital encounter of 93/23/55  Basic metabolic panel  Result Value Ref Range   Sodium 137 135 - 145 mmol/L   Potassium 3.0 (L) 3.5 - 5.1 mmol/L   Chloride 105 101 - 111 mmol/L   CO2 28 22 - 32 mmol/L   Glucose, Bld 143 (H) 65 - 99 mg/dL   BUN 15 6 - 20 mg/dL   Creatinine, Ser 0.74 0.44 - 1.00 mg/dL   Calcium 8.8 (L) 8.9 - 10.3 mg/dL   GFR calc non Af Amer >60 >60 mL/min   GFR calc Af Amer >60 >60 mL/min   Anion gap 4 (L) 5 - 15  CBC  Result Value Ref Range   WBC 2.8 (L) 4.0 - 10.5 K/uL   RBC 3.69 (L) 3.87 - 5.11 MIL/uL   Hemoglobin 10.6 (L) 12.0 - 15.0 g/dL   HCT  32.0 (L) 36.0 - 46.0 %   MCV 86.7 78.0 - 100.0 fL   MCH 28.7 26.0 - 34.0 pg   MCHC 33.1 30.0 - 36.0 g/dL   RDW 17.6 (H)  11.5 - 15.5 %   Platelets 46 (L) 150 - 400 K/uL  Urinalysis, Routine w reflex microscopic  Result Value Ref Range   Color, Urine AMBER (A) YELLOW   APPearance HAZY (A) CLEAR   Specific Gravity, Urine 1.014 1.005 - 1.030   pH 7.0 5.0 - 8.0   Glucose, UA NEGATIVE NEGATIVE mg/dL   Hgb urine dipstick NEGATIVE NEGATIVE   Bilirubin Urine NEGATIVE NEGATIVE   Ketones, ur NEGATIVE NEGATIVE mg/dL   Protein, ur NEGATIVE NEGATIVE mg/dL   Nitrite NEGATIVE NEGATIVE   Leukocytes, UA LARGE (A) NEGATIVE   RBC / HPF 6-30 0 - 5 RBC/hpf   WBC, UA TOO NUMEROUS TO COUNT 0 - 5 WBC/hpf   Bacteria, UA RARE (A) NONE SEEN   Squamous Epithelial / LPF NONE SEEN NONE SEEN   WBC Clumps PRESENT    Mucous PRESENT    Amorphous Crystal PRESENT   CBG monitoring, ED  Result Value Ref Range   Glucose-Capillary 155 (H) 65 - 99 mg/dL   No results found.  DIAGNOSTIC STUDIES:  Oxygen Saturation is 100% on RA, normal by my interpretation.    COORDINATION OF CARE:  11:21 PM Discussed treatment plan with pt and family members at bedside.    Procedures Procedures (including critical care time)  Medications Ordered in ED  Results for orders placed or performed during the hospital encounter of 78/67/54  Basic metabolic panel  Result Value Ref Range   Sodium 137 135 - 145 mmol/L   Potassium 3.0 (L) 3.5 - 5.1 mmol/L   Chloride 105 101 - 111 mmol/L   CO2 28 22 - 32 mmol/L   Glucose, Bld 143 (H) 65 - 99 mg/dL   BUN 15 6 - 20 mg/dL   Creatinine, Ser 0.74 0.44 - 1.00 mg/dL   Calcium 8.8 (L) 8.9 - 10.3 mg/dL   GFR calc non Af Amer >60 >60 mL/min   GFR calc Af Amer >60 >60 mL/min   Anion gap 4 (L) 5 - 15  CBC  Result Value Ref Range   WBC 2.8 (L) 4.0 - 10.5 K/uL   RBC 3.69 (L) 3.87 - 5.11 MIL/uL   Hemoglobin 10.6 (L) 12.0 - 15.0 g/dL   HCT 32.0 (L) 36.0 - 46.0 %   MCV 86.7 78.0 - 100.0 fL   MCH 28.7 26.0 - 34.0 pg   MCHC 33.1 30.0 - 36.0 g/dL   RDW 17.6 (H) 11.5 - 15.5 %   Platelets 46 (L) 150 - 400 K/uL    Urinalysis, Routine w reflex microscopic  Result Value Ref Range   Color, Urine AMBER (A) YELLOW   APPearance HAZY (A) CLEAR   Specific Gravity, Urine 1.014 1.005 - 1.030   pH 7.0 5.0 - 8.0   Glucose, UA NEGATIVE NEGATIVE mg/dL   Hgb urine dipstick NEGATIVE NEGATIVE   Bilirubin Urine NEGATIVE NEGATIVE   Ketones, ur NEGATIVE NEGATIVE mg/dL   Protein, ur NEGATIVE NEGATIVE mg/dL   Nitrite NEGATIVE NEGATIVE   Leukocytes, UA LARGE (A) NEGATIVE   RBC / HPF 6-30 0 - 5 RBC/hpf   WBC, UA TOO NUMEROUS TO COUNT 0 - 5 WBC/hpf   Bacteria, UA RARE (A) NONE SEEN   Squamous Epithelial / LPF NONE SEEN NONE SEEN   WBC Clumps PRESENT  Mucous PRESENT    Amorphous Crystal PRESENT   CBG monitoring, ED  Result Value Ref Range   Glucose-Capillary 155 (H) 65 - 99 mg/dL   No results found.    Final Clinical Impressions(s) / ED Diagnoses  Uti:  All questions answered to patient's satisfaction. Based on history and exam patient has been appropriately medically screened and emergency conditions excluded. Patient is stable for discharge at this time. Strict return precautions given for any further episodes, persistent fever, weakness or any concerns. New Prescriptions New Prescriptions   No medications on file  I personally performed the services described in this documentation, which was scribed in my presence. The recorded information has been reviewed and is accurate.      Veatrice Kells, MD 11/02/16 [phone]

## 2016-11-01 NOTE — ED Triage Notes (Signed)
Brought in by EMS from home with c/o syncope.  Pt's son reported that pt was sitting on a chair when she suddenly became pale and diaphoretic and then fainted--- son unable to tell how long the episode lasted.  Pt was awake on EMS' arrival at the scene.  Pt's CBG = 185.  Per pt's spouse who is the main caregiver at home, pt has been "progressively getting weak" for the past few days.

## 2016-11-02 ENCOUNTER — Emergency Department (HOSPITAL_COMMUNITY): Payer: Medicare Other

## 2016-11-02 ENCOUNTER — Observation Stay (HOSPITAL_BASED_OUTPATIENT_CLINIC_OR_DEPARTMENT_OTHER): Payer: Medicare Other

## 2016-11-02 ENCOUNTER — Encounter (HOSPITAL_COMMUNITY): Payer: Self-pay | Admitting: Emergency Medicine

## 2016-11-02 DIAGNOSIS — K7460 Unspecified cirrhosis of liver: Secondary | ICD-10-CM | POA: Diagnosis present

## 2016-11-02 DIAGNOSIS — R001 Bradycardia, unspecified: Secondary | ICD-10-CM

## 2016-11-02 DIAGNOSIS — F05 Delirium due to known physiological condition: Secondary | ICD-10-CM | POA: Diagnosis present

## 2016-11-02 DIAGNOSIS — Z96659 Presence of unspecified artificial knee joint: Secondary | ICD-10-CM | POA: Diagnosis present

## 2016-11-02 DIAGNOSIS — D61818 Other pancytopenia: Secondary | ICD-10-CM | POA: Diagnosis present

## 2016-11-02 DIAGNOSIS — Z9221 Personal history of antineoplastic chemotherapy: Secondary | ICD-10-CM | POA: Diagnosis not present

## 2016-11-02 DIAGNOSIS — D509 Iron deficiency anemia, unspecified: Secondary | ICD-10-CM | POA: Diagnosis present

## 2016-11-02 DIAGNOSIS — I4510 Unspecified right bundle-branch block: Secondary | ICD-10-CM | POA: Diagnosis present

## 2016-11-02 DIAGNOSIS — Z933 Colostomy status: Secondary | ICD-10-CM | POA: Diagnosis not present

## 2016-11-02 DIAGNOSIS — D72818 Other decreased white blood cell count: Secondary | ICD-10-CM | POA: Diagnosis not present

## 2016-11-02 DIAGNOSIS — Z880 Allergy status to penicillin: Secondary | ICD-10-CM | POA: Diagnosis not present

## 2016-11-02 DIAGNOSIS — F329 Major depressive disorder, single episode, unspecified: Secondary | ICD-10-CM | POA: Diagnosis present

## 2016-11-02 DIAGNOSIS — A419 Sepsis, unspecified organism: Secondary | ICD-10-CM | POA: Diagnosis not present

## 2016-11-02 DIAGNOSIS — R410 Disorientation, unspecified: Secondary | ICD-10-CM | POA: Diagnosis not present

## 2016-11-02 DIAGNOSIS — Z85038 Personal history of other malignant neoplasm of large intestine: Secondary | ICD-10-CM | POA: Diagnosis not present

## 2016-11-02 DIAGNOSIS — Z888 Allergy status to other drugs, medicaments and biological substances status: Secondary | ICD-10-CM | POA: Diagnosis not present

## 2016-11-02 DIAGNOSIS — D696 Thrombocytopenia, unspecified: Secondary | ICD-10-CM | POA: Diagnosis not present

## 2016-11-02 DIAGNOSIS — R55 Syncope and collapse: Secondary | ICD-10-CM

## 2016-11-02 DIAGNOSIS — E876 Hypokalemia: Secondary | ICD-10-CM | POA: Diagnosis not present

## 2016-11-02 DIAGNOSIS — G308 Other Alzheimer's disease: Secondary | ICD-10-CM | POA: Diagnosis not present

## 2016-11-02 DIAGNOSIS — F0281 Dementia in other diseases classified elsewhere with behavioral disturbance: Secondary | ICD-10-CM | POA: Diagnosis not present

## 2016-11-02 DIAGNOSIS — M810 Age-related osteoporosis without current pathological fracture: Secondary | ICD-10-CM | POA: Diagnosis present

## 2016-11-02 DIAGNOSIS — I10 Essential (primary) hypertension: Secondary | ICD-10-CM | POA: Diagnosis not present

## 2016-11-02 DIAGNOSIS — N390 Urinary tract infection, site not specified: Secondary | ICD-10-CM | POA: Diagnosis present

## 2016-11-02 DIAGNOSIS — K219 Gastro-esophageal reflux disease without esophagitis: Secondary | ICD-10-CM | POA: Diagnosis present

## 2016-11-02 DIAGNOSIS — G9340 Encephalopathy, unspecified: Secondary | ICD-10-CM

## 2016-11-02 DIAGNOSIS — F0391 Unspecified dementia with behavioral disturbance: Secondary | ICD-10-CM | POA: Diagnosis not present

## 2016-11-02 DIAGNOSIS — Z8551 Personal history of malignant neoplasm of bladder: Secondary | ICD-10-CM | POA: Diagnosis not present

## 2016-11-02 DIAGNOSIS — Z936 Other artificial openings of urinary tract status: Secondary | ICD-10-CM | POA: Diagnosis not present

## 2016-11-02 DIAGNOSIS — C679 Malignant neoplasm of bladder, unspecified: Secondary | ICD-10-CM | POA: Diagnosis not present

## 2016-11-02 DIAGNOSIS — M1990 Unspecified osteoarthritis, unspecified site: Secondary | ICD-10-CM | POA: Diagnosis present

## 2016-11-02 DIAGNOSIS — Z87891 Personal history of nicotine dependence: Secondary | ICD-10-CM | POA: Diagnosis not present

## 2016-11-02 DIAGNOSIS — F0390 Unspecified dementia without behavioral disturbance: Secondary | ICD-10-CM | POA: Diagnosis present

## 2016-11-02 LAB — COMPREHENSIVE METABOLIC PANEL
ALBUMIN: 2.9 g/dL — AB (ref 3.5–5.0)
ALT: 17 U/L (ref 14–54)
AST: 34 U/L (ref 15–41)
Alkaline Phosphatase: 62 U/L (ref 38–126)
Anion gap: 6 (ref 5–15)
BUN: 14 mg/dL (ref 6–20)
CHLORIDE: 110 mmol/L (ref 101–111)
CO2: 26 mmol/L (ref 22–32)
CREATININE: 0.53 mg/dL (ref 0.44–1.00)
Calcium: 8.8 mg/dL — ABNORMAL LOW (ref 8.9–10.3)
GFR calc Af Amer: >60 mL/min (ref >60)
GFR calc non Af Amer: >60 mL/min (ref >60)
GLUCOSE: 107 mg/dL — AB (ref 65–99)
POTASSIUM: 3.7 mmol/L (ref 3.5–5.1)
Sodium: 142 mmol/L (ref 135–145)
Total Bilirubin: 1.8 mg/dL — ABNORMAL HIGH (ref 0.3–1.2)
Total Protein: 5.5 g/dL — ABNORMAL LOW (ref 6.5–8.1)

## 2016-11-02 LAB — GLUCOSE, CAPILLARY
GLUCOSE-CAPILLARY: 112 mg/dL — AB (ref 65–99)
GLUCOSE-CAPILLARY: 104 mg/dL — AB (ref 65–99)
Glucose-Capillary: 123 mg/dL — ABNORMAL HIGH (ref 65–99)
Glucose-Capillary: 132 mg/dL — ABNORMAL HIGH (ref 65–99)

## 2016-11-02 LAB — CBC
HEMATOCRIT: 31.9 % — AB (ref 36.0–46.0)
Hemoglobin: 10.6 g/dL — ABNORMAL LOW (ref 12.0–15.0)
MCH: 29 pg (ref 26.0–34.0)
MCHC: 33.2 g/dL (ref 30.0–36.0)
MCV: 87.2 fL (ref 78.0–100.0)
Platelets: 45 10*3/uL — ABNORMAL LOW (ref 150–400)
RBC: 3.66 MIL/uL — ABNORMAL LOW (ref 3.87–5.11)
RDW: 17.5 % — AB (ref 11.5–15.5)
WBC: 2.3 10*3/uL — ABNORMAL LOW (ref 4.0–10.5)

## 2016-11-02 LAB — PHOSPHORUS: PHOSPHORUS: 2.7 mg/dL (ref 2.5–4.6)

## 2016-11-02 LAB — VAS US CAROTID
LCCAPDIAS: 14 cm/s
LEFT ECA DIAS: -24 cm/s
LEFT VERTEBRAL DIAS: 8 cm/s
LICADDIAS: -10 cm/s
LICAPDIAS: 13 cm/s
LICAPSYS: 79 cm/s
Left CCA dist dias: -11 cm/s
Left CCA dist sys: -67 cm/s
Left CCA prox sys: 73 cm/s
Left ICA dist sys: -65 cm/s
RIGHT ECA DIAS: 12 cm/s
RIGHT VERTEBRAL DIAS: 14 cm/s
Right CCA prox dias: 13 cm/s
Right CCA prox sys: 86 cm/s
Right cca dist sys: -72 cm/s

## 2016-11-02 LAB — ECHOCARDIOGRAM COMPLETE
Height: 65 in
Weight: 3856 oz

## 2016-11-02 LAB — HEPATIC FUNCTION PANEL
ALBUMIN: 2.9 g/dL — AB (ref 3.5–5.0)
ALT: 17 U/L (ref 14–54)
AST: 31 U/L (ref 15–41)
Alkaline Phosphatase: 65 U/L (ref 38–126)
BILIRUBIN INDIRECT: 1.2 mg/dL — AB (ref 0.3–0.9)
Bilirubin, Direct: 0.4 mg/dL (ref 0.1–0.5)
TOTAL PROTEIN: 5.7 g/dL — AB (ref 6.5–8.1)
Total Bilirubin: 1.6 mg/dL — ABNORMAL HIGH (ref 0.3–1.2)

## 2016-11-02 LAB — LACTIC ACID, PLASMA
Lactic Acid, Venous: 1.4 mmol/L (ref 0.5–1.9)
Lactic Acid, Venous: 1.2 mmol/L (ref 0.5–1.9)

## 2016-11-02 LAB — TROPONIN I: Troponin I: <0.03 ng/mL (ref <0.03)

## 2016-11-02 LAB — INFLUENZA PANEL BY PCR (TYPE A & B)
INFLBPCR: NEGATIVE
Influenza A By PCR: NEGATIVE

## 2016-11-02 LAB — I-STAT TROPONIN, ED: TROPONIN I, POC: 0 ng/mL (ref 0.00–0.08)

## 2016-11-02 LAB — AMMONIA: AMMONIA: 66 umol/L — AB (ref 9–35)

## 2016-11-02 LAB — PROCALCITONIN: Procalcitonin: <0.1 ng/mL

## 2016-11-02 LAB — MAGNESIUM: Magnesium: 1.7 mg/dL (ref 1.7–2.4)

## 2016-11-02 LAB — TSH: TSH: 0.659 u[IU]/mL (ref 0.350–4.500)

## 2016-11-02 MED ORDER — SODIUM CHLORIDE 0.9 % IV SOLN
INTRAVENOUS | Status: AC
  Administered 2016-11-02 (×2): via INTRAVENOUS

## 2016-11-02 MED ORDER — PANTOPRAZOLE SODIUM 40 MG PO TBEC
40.0000 mg | DELAYED_RELEASE_TABLET | Freq: Every day | ORAL | Status: DC
  Administered 2016-11-02 – 2016-11-04 (×3): 40 mg via ORAL
  Filled 2016-11-02 (×3): qty 1

## 2016-11-02 MED ORDER — GUAIFENESIN ER 600 MG PO TB12: 600.0000 mg | ORAL_TABLET | Freq: Two times a day (BID) | ORAL | Status: DC | PRN

## 2016-11-02 MED ORDER — ONDANSETRON HCL 4 MG/2ML IJ SOLN: 4.0000 mg | Freq: Four times a day (QID) | INTRAMUSCULAR | Status: DC | PRN

## 2016-11-02 MED ORDER — DEXTROSE 5 % IV SOLN
500.0000 mg | Freq: Once | INTRAVENOUS | Status: AC
  Administered 2016-11-02 (×2): 500 mg via INTRAVENOUS
  Filled 2016-11-02 (×2): qty 500

## 2016-11-02 MED ORDER — ACETAMINOPHEN 650 MG RE SUPP
650.0000 mg | Freq: Four times a day (QID) | RECTAL | Status: DC | PRN
Start: 2016-11-02 — End: 2016-11-04

## 2016-11-02 MED ORDER — GUAIFENESIN ER 600 MG PO TB12
600.0000 mg | ORAL_TABLET | Freq: Two times a day (BID) | ORAL | Status: DC
  Administered 2016-11-02 – 2016-11-04 (×5): 600 mg via ORAL
  Filled 2016-11-02 (×5): qty 1

## 2016-11-02 MED ORDER — ALBUTEROL SULFATE (2.5 MG/3ML) 0.083% IN NEBU: 2.5000 mg | INHALATION_SOLUTION | RESPIRATORY_TRACT | Status: DC | PRN

## 2016-11-02 MED ORDER — POLYETHYLENE GLYCOL 3350 17 G PO PACK
17.0000 g | PACK | Freq: Every day | ORAL | Status: DC | PRN
Start: 2016-11-02 — End: 2016-11-04

## 2016-11-02 MED ORDER — CITALOPRAM HYDROBROMIDE 20 MG PO TABS
40.0000 mg | ORAL_TABLET | Freq: Every day | ORAL | Status: DC
  Administered 2016-11-02 – 2016-11-03 (×2): 40 mg via ORAL
  Filled 2016-11-02 (×2): qty 2

## 2016-11-02 MED ORDER — DEXTROSE 5 % IV SOLN
1.0000 g | INTRAVENOUS | Status: DC
  Administered 2016-11-03 (×2): 1 g via INTRAVENOUS
  Filled 2016-11-02 (×2): qty 10

## 2016-11-02 MED ORDER — ACETAMINOPHEN 325 MG PO TABS
650.0000 mg | ORAL_TABLET | Freq: Four times a day (QID) | ORAL | Status: DC | PRN
Start: 2016-11-02 — End: 2016-11-04

## 2016-11-02 MED ORDER — POTASSIUM CHLORIDE CRYS ER 20 MEQ PO TBCR
40.0000 meq | EXTENDED_RELEASE_TABLET | Freq: Once | ORAL | Status: AC
  Administered 2016-11-02: 40 meq via ORAL
  Filled 2016-11-02: qty 2

## 2016-11-02 MED ORDER — ONDANSETRON HCL 4 MG PO TABS: 4.0000 mg | ORAL_TABLET | Freq: Four times a day (QID) | ORAL | Status: DC | PRN

## 2016-11-02 MED ORDER — SODIUM CHLORIDE 0.9% FLUSH
3.0000 mL | Freq: Two times a day (BID) | INTRAVENOUS | Status: DC
  Administered 2016-11-02 – 2016-11-04 (×3): 3 mL via INTRAVENOUS

## 2016-11-02 MED ORDER — DEXTROSE 5 % IV SOLN
1.0000 g | Freq: Once | INTRAVENOUS | Status: AC
  Administered 2016-11-02: 1 g via INTRAVENOUS

## 2016-11-02 MED ORDER — DEXTROSE 5 % IV SOLN
500.0000 mg | INTRAVENOUS | Status: DC
  Administered 2016-11-03: 500 mg via INTRAVENOUS
  Filled 2016-11-02: qty 500

## 2016-11-02 MED ORDER — SODIUM CHLORIDE 0.9 % IV BOLUS (SEPSIS)
500.0000 mL | Freq: Once | INTRAVENOUS | Status: AC
  Administered 2016-11-02: 500 mL via INTRAVENOUS

## 2016-11-02 NOTE — Progress Notes (Signed)
*  PRELIMINARY RESULTS* Vascular Ultrasound Carotid Duplex (Doppler) has been completed.   Study was technically difficult due to patient body habitus and anatomy. Findings suggest 1-39% internal carotid artery stenosis bilaterally. Vertebral arteries are patent with antegrade flow.  11/02/2016 10:55 AM Maudry Mayhew, BS, RVT, RDCS, RDMS

## 2016-11-02 NOTE — Progress Notes (Signed)
Pharmacy Antibiotic Note  [name] is a 76 y.o. female c/o near syncope admitted on 11/01/2016 with pneumonia.  Pharmacy has been consulted for rocephin and zithromycin dosing.  Plan: Rocephin 1Gm IV q24h Zmax 500 mg IV q24h Rx will sign off as no further adjustments are needed     Temp (24hrs), Avg:97.8 F (36.6 C), Min:97.8 F (36.6 C), Max:97.8 F (36.6 C)   Recent Labs Lab 11/01/16 2225  WBC 2.8*  CREATININE 0.74    CrCl cannot be calculated (Unknown ideal weight.).    Allergies  Allergen Reactions  . Codeine Swelling and Rash    Swelling in throat  . Morphine And Related Swelling and Rash  . Other Anaphylaxis    Bee Stings  . Phytonadione Other (See Comments)    Patient experienced episode of cyanosis following dose of Vitamin K 10 mg IV.  . Meat Extract     Patient doesn't eat meat   . Promethazine Hcl     Per MD chart - 08/08/11  . Penicillins Rash    Has patient had a PCN reaction causing immediate rash, facial/tongue/throat swelling, SOB or lightheadedness with hypotension: YES Has patient had a PCN reaction causing severe rash involving mucus membranes or skin necrosis: NO Has patient had a PCN reaction that required hospitalization: NO Has patient had a PCN reaction occurring within the last 10 years: NO If all of the above answers are "NO", then may proceed with Cephalosporin use.     Antimicrobials this admission: 2/11 rocephin >>  2/11 zmax >>   Dose adjustments this admission:   Microbiology results:  BCx:   UCx:    Sputum:    MRSA PCR:   Thank you for allowing pharmacy to be a part of this patient's care.  [name] 11/02/2016 5:16 AM

## 2016-11-02 NOTE — Progress Notes (Signed)
  Echocardiogram 2D Echocardiogram has been performed.  Tresa Res 11/02/2016, 11:31 AM

## 2016-11-02 NOTE — H&P (Signed)
[name] VCB:[id number] DOB: [birth year] DOA: 11/01/2016     PCP: Odette Fraction, MD   Outpatient Specialists: Oncology [facility] , [facility] Patient coming from:   home Lives With family   Chief Complaint: near Syncope  HPI: [name] is a 76 y.o. 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 Some reported as her heart rate dropped to 48. Husband reported fever they were not able to check it with thermometer. She have had intermittent back  pain for the past 1-2 weeks EMS was called CBG on arrival was 185 has been progressively getting weak for past few days. Haven't endorsed any chest pain or shortness of breath patient unable to provide her own history secondary to severe dementia. Family reports caregiver recently diagnosed with Influenza Patient seemed to be more confused than her baseline but denies any localized weakness  Ellyn Hack back pain patient has history of disc inflammation for which she has been seeing her primary care doctor in January Regarding pertinent Chronic problems: As been seen by oncology for her iron deficiency anemia has been taking iron infusions responded well, and 2001 she has underwent hemicolectomy for colon cancer Bladder was removed in 2008 and she status post urostomy she wears that chronically.  He has chronic leukopenia with her white blood cell counts ranging between 2-3 and chronic thrombocytopenia IN ER:  Temp (24hrs), Avg:97.8 F (36.6 C), Min:97.8 F (36.6 C), Max:97.8 F (36.6 C)     RR 15 Oxygen 100%HR53 BP 153/76 Troponin 0.00 Albumin 2.9  which is down from prior  K3.0 creatinine 0.74 which is up from baseline of 0.59 WBC 2.8 which is baseline hemoglobin 10.6 which is baseline platelets 46 which is chronic but in December was up to 71 Following Medications were ordered in ER: Medications  sodium chloride 0.9 % bolus 500 mL (not administered)  cefTRIAXone (ROCEPHIN) 1 g in dextrose 5 % 50 mL IVPB (not administered)      Hospitalist was called for admission for Syncopal event in the setting of bradycardia  Review of Systems:    Pertinent positives include: Fevers, chills, fatigue, back pain,   Constitutional:  No weight loss, night sweats, ,weight loss  HEENT:  No headaches, Difficulty swallowing,Tooth/dental problems,Sore throat,  No sneezing, itching, ear ache, nasal congestion, post nasal drip,  Cardio-vascular:  No chest pain, Orthopnea, PND, anasarca, dizziness, palpitations.no Bilateral lower extremity swelling  GI:  No heartburn, indigestion, abdominal pain, nausea, vomiting, diarrhea, change in bowel habits, loss of appetite, melena, blood in stool, hematemesis Resp:  no shortness of breath at rest. No dyspnea on exertion, No excess mucus, no productive cough, No non-productive cough, No coughing up of blood.No change in color of mucus.No wheezing. Skin:  no rash or lesions. No jaundice GU:  no dysuria, change in color of urine, no urgency or frequency. No straining to urinate.  No flank pain.  Musculoskeletal:  No joint pain or no joint swelling. No decreased range of motion. No back pain.  Psych:  No change in mood or affect. No depression or anxiety. No memory loss.  Neuro: no localizing neurological complaints, no tingling, no weakness, no double vision,  no gait abnormality, no slurred speech, no confusion  As per HPI otherwise 10 point review of systems negative.   Past Medical History: Past Medical History:  Diagnosis Date  . Anemia, iron deficiency    iron injection about 6wks ago  .  Arthritis   . Attention to urostomy [facility])    urostomy d/t hx bladder cancer  . Bladder cancer ([place]) 2003   bladder removed.  [name]  . Blood dyscrasia    thromboctopenia  . Blood transfusion   . Cataract immature   . Cirrhosis ([place]) 08/26/2011  . Colon cancer [facility]) 2001   hemicolectomy [name]  . Complication of anesthesia    confusion x 2 to 3 days  . Constipation   . Depression    takes Celexa daily  . GERD (gastroesophageal reflux disease)    takes Protonix daily  . Headache(784.0)    occasionally  . Hemorrhoids   . History of blood clots 50+yrs ago   legs when she was pregnancy  . History of colon cancer   . History of colon polyps   . History of GI bleed   . History of kidney stones   . Hx of transfusion of whole blood   . Hypertension    takes Ramipril and Metoprolol daily  . Joint pain   . Joint swelling   . Memory loss    d/t chemotherapy;short and long term;takes Aricept daily  . Osteoporosis    takes Vit D3 daily  . Thrombocytopenia ([place])    Past Surgical History:  Procedure Laterality Date  . ABDOMINAL HYSTERECTOMY    . APPENDECTOMY    . BLADDER SURGERY     bladder removed d/t cancer  . CHOLECYSTECTOMY    . COLON RESECTION     with colostomy then colostomy reversal  . COLON SURGERY  2004  . COLON SURGERY  2001  . COLONOSCOPY    . ESOPHAGOGASTRODUODENOSCOPY  02/27/2012   Procedure: ESOPHAGOGASTRODUODENOSCOPY (EGD);  Surgeon: [name], MD;  Location: [name];  Service: Endoscopy;  Laterality: N/A;  bedside case  . NEPHROLITHOTOMY Left 05/04/2014   Procedure: LEFT PERCUTANEOUS NEPHROLITHOTOMY ;  Surgeon: [name], MD;  Location: [facility];  Service: Urology;  Laterality: Left;  . TOTAL KNEE ARTHROPLASTY  2010   Left knee     Social History:  Ambulatory  , walker      reports that she quit smoking about 25 years ago. Her smoking use included Cigarettes. She started smoking about 61 years ago. She has a 17.50 pack-year smoking history.  She has never used smokeless tobacco. She reports that she does not drink alcohol or use drugs.  Allergies:   Allergies  Allergen Reactions  . Codeine Swelling and Rash    Swelling in throat  . Morphine And Related Swelling and Rash  . Other Anaphylaxis    Bee Stings  . Phytonadione Other (See Comments)    Patient experienced episode of cyanosis following dose of Vitamin K 10 mg IV.  . Meat Extract     Patient doesn't eat meat   . Promethazine Hcl     Per MD chart - 08/08/11  . Penicillins Rash    Has patient had a PCN reaction causing immediate rash, facial/tongue/throat swelling, SOB or lightheadedness with hypotension: YES Has patient had a PCN reaction causing severe rash involving mucus membranes or skin necrosis: NO Has patient had a PCN reaction that required hospitalization: NO Has patient had a PCN reaction occurring  within the last 10 years: NO If all of the above answers are "NO", then may proceed with Cephalosporin use.        Family History:   Family History  Problem Relation Age of Onset  . Diabetes Brother   . Cancer Other   . Hypertension Other     Medications: Prior to Admission medications   Medication Sig Start Date End Date Taking? Authorizing Provider  acidophilus (RISAQUAD) CAPS capsule Take 1 capsule by mouth daily.   Yes Historical Provider, MD  Ascorbic Acid (VITAMIN C) 1000 MG tablet Take 1,000 mg by mouth daily.   Yes Historical Provider, MD  Cholecalciferol (VITAMIN D3) 1000 UNITS CAPS Take 1,000 Units by mouth daily.    Yes Historical Provider, MD  Cinnamon 500 MG capsule Take 500 mg by mouth daily.    Yes Historical Provider, MD  citalopram (CELEXA) 40 MG tablet TAKE 1 TABLET BY MOUTH EVERY DAY Patient taking differently: TAKE 40 MG BY MOUTH EACH DAY 03/06/16  Yes [name], MD  Cyanocobalamin (VITAMIN B 12 PO) Take 1 tablet by mouth every morning.    Yes Historical Provider, MD  fexofenadine (ALLEGRA) 180 MG tablet Take 180 mg by  mouth daily.   Yes Historical Provider, MD  fish oil-omega-3 fatty acids 1000 MG capsule Take 1,200 mg by mouth daily.    Yes Historical Provider, MD  Ginkgo Biloba 60 MG TABS Take 60 mg by mouth every morning.    Yes Historical Provider, MD  guaiFENesin (MUCINEX) 600 MG 12 hr tablet Take 600 mg by mouth 2 (two) times daily as needed for cough or to loosen phlegm.   Yes Historical Provider, MD  losartan (COZAAR) 25 MG tablet TAKE 1 TABLET BY MOUTH EVERY DAY, STOP RAMIPRIL Patient taking differently: TAKE 25 MG BY MOUTH EACH DAY 08/11/16  Yes [name], MD  Multiple Vitamin (MULTIVITAMIN WITH MINERALS) TABS tablet Take 1 tablet by mouth daily.   Yes Historical Provider, MD  pantoprazole (PROTONIX) 40 MG tablet Take 1 tablet (40 mg total) by mouth daily. 07/16/16  Yes [name], MD  propranolol (INDERAL) 10 MG tablet TAKE 10 MG BY MOUTH DAILY 08/19/16  Yes Historical Provider, MD  triamcinolone cream (KENALOG) 0.1 % Apply 1 application topically 2 (two) times daily. 10/14/16  Yes [name], MD  vitamin E 100 UNIT capsule Take 400 Units by mouth daily.    Yes Historical Provider, MD  nystatin cream (MYCOSTATIN) Apply 1 application topically 2 (two) times daily. Patient not taking: Reported on 11/01/2016 07/23/16   Orlena Sheldon, PA-C  potassium chloride (K-DUR) 10 MEQ tablet Take 1 tablet (10 mEq total) by mouth daily. Patient not taking: Reported on 11/01/2016 07/16/16   [name], MD  predniSONE (DELTASONE) 20 MG tablet 3 tabs poqday 1-2, 2 tabs poqday 3-4, 1 tab poqday 5-6 Patient not taking: Reported on 11/01/2016 10/14/16   [name], MD    Physical Exam: Patient Vitals for the past 24 hrs:  BP Temp Temp src Pulse Resp SpO2  11/02/16 0000 153/76 - - (!) 53 15 100 %  11/01/16 2132 164/70 97.8 F (36.6 C) Oral (!) 52 17 100 %    1. General:  in No Acute distress 2. Psychological: Alert but not  Oriented 3. Head/ENT:    Dry Mucous Membranes  Head Non traumatic, neck supple                            Poor Dentition 4. SKIN:  decreased Skin turgor,  Skin clean Dry and intact no rash 5. Heart: Regular rate and rhythm no  Murmur, Rub or gallop 6. Lungs:   no wheezes or crackles   7. Abdomen: Soft,  non-tender, Non distended urostomy in place 8. Lower extremities: no clubbing, cyanosis, or edema 9. Neurologically Grossly intact, moving all 4 extremities equally   10. MSK: Normal range of motion   body mass index is unknown because there is no height or weight on file.  Labs on Admission:   Labs on Admission: I have personally reviewed following labs and imaging studies  CBC:  Recent Labs Lab 11/01/16 2225  WBC 2.8*  HGB 10.6*  HCT 32.0*  MCV 86.7  PLT 46*   Basic Metabolic Panel:  Recent Labs Lab 11/01/16 2225  NA 137  K 3.0*  CL 105  CO2 28  GLUCOSE 143*  BUN 15  CREATININE 0.74  CALCIUM 8.8*   GFR: CrCl cannot be calculated (Unknown ideal weight.). Liver Function Tests:  Recent Labs Lab 11/01/16 2343  AST 31  ALT 17  ALKPHOS 65  BILITOT 1.6*  PROT 5.7*  ALBUMIN 2.9*   No results for input(s): LIPASE, AMYLASE in the last 168 hours. No results for input(s): AMMONIA in the last 168 hours. Coagulation Profile: No results for input(s): INR, PROTIME in the last 168 hours. Cardiac Enzymes: No results for input(s): CKTOTAL, CKMB, CKMBINDEX, TROPONINI in the last 168 hours. BNP (last 3 results) No results for input(s): PROBNP in the last 8760 hours. HbA1C: No results for input(s): HGBA1C in the last 72 hours. CBG:  Recent Labs Lab 11/01/16 2135  GLUCAP 155*   Lipid Profile: No results for input(s): CHOL, HDL, LDLCALC, TRIG, CHOLHDL, LDLDIRECT in the last 72 hours. Thyroid Function Tests: No results for input(s): TSH, T4TOTAL, FREET4, T3FREE, THYROIDAB in the last 72 hours. Anemia Panel: No results for input(s): VITAMINB12, FOLATE, FERRITIN, TIBC, IRON, RETICCTPCT in the last 72  hours. Urine analysis:    Component Value Date/Time   COLORURINE AMBER (A) 11/01/2016 2345   APPEARANCEUR HAZY (A) 11/01/2016 2345   LABSPEC 1.014 11/01/2016 2345   PHURINE 7.0 11/01/2016 2345   GLUCOSEU NEGATIVE 11/01/2016 2345   HGBUR NEGATIVE 11/01/2016 2345   BILIRUBINUR NEGATIVE 11/01/2016 2345   KETONESUR NEGATIVE 11/01/2016 2345   PROTEINUR NEGATIVE 11/01/2016 2345   UROBILINOGEN 1.0 04/06/2014 1856   NITRITE NEGATIVE 11/01/2016 2345   LEUKOCYTESUR LARGE (A) 11/01/2016 2345   Sepsis Labs: @LABRCNTIP (procalcitonin:4,lacticidven:4) )No results found for this or any previous visit (from the past 240 hour(s)).     UA increased WBC  No results found for: HGBA1C  CrCl cannot be calculated (Unknown ideal weight.).  BNP (last 3 results) No results for input(s): PROBNP in the last 8760 hours.   ECG REPORT  Independently reviewed Rate:51  Rhythm: sinus bradycardia ST&T Change: No acute ischemic changes  QTC  516  There were no vitals filed for this visit.   Cultures:    Component Value Date/Time   SDES URINE, RANDOM 04/07/2014 0046   SPECREQUEST NONE 04/07/2014 0046   CULT  04/07/2014 0046    ESCHERICHIA COLI Performed at [facility] 04/09/2014 FINAL 04/07/2014 0046     Radiological Exams on Admission: Dg Chest 2 View  Result Date: 11/02/2016 CLINICAL DATA:  Sudden onset severe confusion today. History of bladder cancer, colon cancer, dementia, and hypertension. Back pain and abdominal pain for 2 weeks. Weakness. EXAM: CHEST  2 VIEW COMPARISON:  07/11/2016 FINDINGS: Shallow inspiration with linear atelectasis or fibrosis in the lung bases similar to prior study. No focal airspace disease or consolidation. Cardiac enlargement without vascular congestion or edema. Calcified aorta. No pneumothorax. No pleural effusions. Degenerative changes in the spine. Surgical clips in the right upper quadrant. IMPRESSION: Shallow inspiration with atelectasis  or infiltration in the lung bases similar to prior study. No focal consolidation. Cardiac enlargement. Electronically Signed   By: Lucienne Capers M.D.   On: 11/02/2016 00:45    Chart has been reviewed    Assessment/Plan   76 y.o. 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 Benign essential HTN hold ARB given increase in CR, hold propranolol given bradycardia . Dementia - chronic expect some degree of sundowning . Bladder cancer ([place]) - chronic will need to follow up with oncology  . Thrombocytopenia ([place]) - chronic thought to be secondary to liver disease . Leukopenia - chronic currently at baseline . Hypokalemia - will replace and check magnesium level . Acute encephalopathy - transient in the setting of febrile illness . Syncope - will evaluate for cardiac etiology  Sepsis - given fever, acute encephalopathy luekopenia  test for influenza, continue with IV antibiotics given possibility of pulmonary infiltrate will cover for CAP. Abnormal UA - pt. Sp urostomy but will await urine culture.  Other plan as per orders.  DVT prophylaxis:  SCD     Code Status:   DNR/DNI   as per patient and family   Family Communication:   Family   at  Bedside  plan of care was discussed with   Daughter,  Husband,  Disposition Plan:    To home once workup is complete and patient is stable     Consults called: email. Cardiology    Admission status:  obs   Level of care   tele        I have spent a total of 56 min on this admission   Tyrees Chopin 11/02/2016, 2:24 AM    [facility]  Pager [phone]   after 2 AM please  page floor coverage PA If 7AM-7PM, please contact the day team taking care of the patient  [email or web address]  Password [email or web address]

## 2016-11-02 NOTE — Progress Notes (Signed)
PROGRESS NOTE    [name]  NLG:[id number] DOB: 02-15-1941 DOA: 11/01/2016 PCP: Odette Fraction, MD   Chief Complaint  Patient presents with  . Loss of Consciousness    Brief Narrative:  HPI on 11/02/2016 by Dr. Phillips Hay [name] is a 76 y.o. 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  Presented with syncope patient's son reported the patient was sitting in the chair and sudden became pale and diaphoretic and fainted. Some reported as her heart rate dropped to 48. Husband reported fever they were not able to check it with thermometer. She have had intermittent back  pain for the past 1-2 weeks EMS was called [facility]G on arrival was 185 has been progressively getting weak for past few days. Haven't endorsed any chest pain or shortness of breath patient unable to provide her own history secondary to severe dementia. Family reports caregiver recently diagnosed with Influenza Patient seemed to be more confused than her baseline but denies any localized weakness  [name] back pain patient has history of disc inflammation for which she has been seeing her primary care doctor in January Regarding pertinent Chronic problems: As been seen by oncology for her iron deficiency anemia has been taking iron infusions responded well, and 2001 she has underwent hemicolectomy for colon cancer Bladder was removed in 2008 and she status post urostomy she wears that chronically.  Assessment & Plan   Syncope -Unknown etiology -patient had some bradycardia -Continue tele monitoring -Cardiology consulted and appreciated -Echocardiogram pending -Carotid doppler: 1-39% internal carotid artery stenosis bilaterally. Vertebral arteries are  patent with antegrade flow.  Essential hypertension -Propranolol held  Bradycardia -propanolol held -Continue to monitor closely  Thrombocytopenia, chronic -Platelets 45, continue to monitor CBC -thought to be secondary to liver disease  SIRS -patient presented with fever and leukopenia -Placed on azithromycin and ceftriaxone -Blood and urine cultures pending -Chest x-ray unremarkable for infection  -UA showed TNTC WBC, rare bacteria, large leukocytes, negative nitrites   History of bladder cancer/colostomy -s/p urostomy and hemicolectomy   Hypokalemia  Acute encephalopathy -Possibly secondary to the above -Continue to monitor closely  ?history of cirrhosis   DVT Prophylaxis  SCDs  Code Status: DNR  Family Communication: Family at bedside  Disposition Plan: Observation. Pending echocardiogram and workup for syncope. Suspect discharge in 1-2 days.  Consultants Cardiology  Procedures  Carotid doppler  Antibiotics   Anti-infectives    Start     Dose/Rate Route Frequency Ordered Stop   11/03/16 0600  azithromycin (ZITHROMAX) 500 mg in dextrose 5 % 250 mL IVPB     500 mg 250 mL/hr over 60 Minutes Intravenous Every 24 hours 11/02/16 0518     11/02/16 2359  cefTRIAXone (ROCEPHIN) 1 g in dextrose 5 % 50 mL IVPB     1 g 100 mL/hr over 30 Minutes Intravenous Every 24 hours 11/02/16 0518     11/02/16 0515  azithromycin (ZITHROMAX) 500 mg in dextrose 5 % 250 mL IVPB     500 mg 250 mL/hr over 60 Minutes Intravenous  Once 11/02/16 0503 11/02/16 0707   11/02/16 0030  cefTRIAXone (ROCEPHIN) 1 g in dextrose 5 % 50 mL IVPB     1 g 100 mL/hr over 30 Minutes Intravenous  Once 11/02/16 0016 11/02/16 0116  Subjective:   [name] seen and examined today.  Patient does have dementia.  Unable to answer questions regarding syncopal episode or events in the past few days. Currently denies Chest pain, shortness of breath, abdominal pain, nausea or vomiting, diarrhea,  or constipation.  Objective:   Vitals:   11/02/16 0100 11/02/16 0200 11/02/16 0300 11/02/16 0517  BP: 134/83 114/95 149/64 (!) 174/59  Pulse: (!) 54 60 (!) 56 (!) 56  Resp: 19 21 14 18   Temp:    98 F (36.7 C)  TempSrc:    Oral  SpO2: 99% 98% 96% 98%  Weight:    109.3 kg (241 lb)  Height:    5' 5"  (1.651 m)   No intake or output data in the 24 hours ending 11/02/16 1109 Filed Weights   11/02/16 0517  Weight: 109.3 kg (241 lb)    Exam  General: Well developed, well nourished, NAD, appears stated age  HEENT: NCAT, mucous membranes moist.   Cardiovascular: S1 S2 auscultated, RRR, soft SEM  Respiratory: Clear to auscultation bilaterally with equal chest rise  Abdomen: Soft, nontender, nondistended, + bowel sounds  Extremities: warm dry without cyanosis clubbing. Trace LE edema.  Neuro: AAOx1 (self), has dementia, nonfocal  Skin: Without rashes exudates or nodules  Psych: Normal affect and demeanor with intact judgement and insight   Data Reviewed: I have personally reviewed following labs and imaging studies  CBC:  Recent Labs Lab 11/01/16 2225 11/02/16 0540  WBC 2.8* 2.3*  HGB 10.6* 10.6*  HCT 32.0* 31.9*  MCV 86.7 87.2  PLT 46* 45*   Basic Metabolic Panel:  Recent Labs Lab 11/01/16 2225 11/02/16 0540  NA 137 142  K 3.0* 3.7  CL 105 110  CO2 28 26  GLUCOSE 143* 107*  BUN 15 14  CREATININE 0.74 0.53  CALCIUM 8.8* 8.8*  MG  --  1.7  PHOS  --  2.7   GFR: Estimated Creatinine Clearance: 72.4 mL/min (by C-G formula based on SCr of 0.53 mg/dL). Liver Function Tests:  Recent Labs Lab 11/01/16 2343 11/02/16 0540  AST 31 34  ALT 17 17  ALKPHOS 65 62  BILITOT 1.6* 1.8*  PROT 5.7* 5.5*  ALBUMIN 2.9* 2.9*   No results for input(s): LIPASE, AMYLASE in the last 168 hours.  Recent Labs Lab 11/01/16 2344  AMMONIA 66*   Coagulation Profile: No results for input(s): INR, PROTIME in the last 168 hours. Cardiac Enzymes:  Recent Labs Lab  11/02/16 0540  TROPONINI <0.03   BNP (last 3 results) No results for input(s): PROBNP in the last 8760 hours. HbA1C: No results for input(s): HGBA1C in the last 72 hours. CBG:  Recent Labs Lab 11/01/16 2135 11/02/16 0814  GLUCAP 155* 112*   Lipid Profile: No results for input(s): CHOL, HDL, LDLCALC, TRIG, CHOLHDL, LDLDIRECT in the last 72 hours. Thyroid Function Tests:  Recent Labs  11/02/16 0540  TSH 0.659   Anemia Panel: No results for input(s): VITAMINB12, FOLATE, FERRITIN, TIBC, IRON, RETICCTPCT in the last 72 hours. Urine analysis:    Component Value Date/Time   COLORURINE AMBER (A) 11/01/2016 2345   APPEARANCEUR HAZY (A) 11/01/2016 2345   LABSPEC 1.014 11/01/2016 2345   PHURINE 7.0 11/01/2016 [place] 11/01/2016 2345   HGBUR NEGATIVE 11/01/2016 [place] 11/01/2016 [place] 11/01/2016 2345   PROTEINUR NEGATIVE 11/01/2016 2345   UROBILINOGEN 1.0 04/06/2014 1856   NITRITE NEGATIVE 11/01/2016 2345   LEUKOCYTESUR LARGE (  A) 11/01/2016 2345   Sepsis Labs: @LABRCNTIP (procalcitonin:4,lacticidven:4)  )No results found for this or any previous visit (from the past 240 hour(s)).    Radiology Studies: Dg Chest 2 View  Result Date: 11/02/2016 CLINICAL DATA:  Sudden onset severe confusion today. History of bladder cancer, colon cancer, dementia, and hypertension. Back pain and abdominal pain for 2 weeks. Weakness. EXAM: CHEST  2 VIEW COMPARISON:  07/11/2016 FINDINGS: Shallow inspiration with linear atelectasis or fibrosis in the lung bases similar to prior study. No focal airspace disease or consolidation. Cardiac enlargement without vascular congestion or edema. Calcified aorta. No pneumothorax. No pleural effusions. Degenerative changes in the spine. Surgical clips in the right upper quadrant. IMPRESSION: Shallow inspiration with atelectasis or infiltration in the lung bases similar to prior study. No focal  consolidation. Cardiac enlargement. Electronically Signed   By: Lucienne Capers M.D.   On: 11/02/2016 00:45     Scheduled Meds: . [START ON 11/03/2016] azithromycin  500 mg Intravenous Q24H  . cefTRIAXone (ROCEPHIN)  IV  1 g Intravenous Q24H  . citalopram  40 mg Oral Daily  . guaiFENesin  600 mg Oral BID  . pantoprazole  40 mg Oral Daily  . sodium chloride flush  3 mL Intravenous Q12H   Continuous Infusions: . sodium chloride 75 mL/hr at 11/02/16 0515     LOS: 0 days   Time Spent in minutes   30 minutes  Margart Zemanek D.O. on 11/02/2016 at 11:09 AM  Between 7am to 7pm - Pager - [phone]  After 7pm go to [email or web address] - password [email or web address]  And look for the night coverage person covering for me after hours  [facility] Office  [phone]

## 2016-11-02 NOTE — Consult Note (Signed)
Requesting provider: Dr. Toy Baker Consulting cardiologist: Dr. Satira Sark  Reason for consultation: Syncope  Clinical Summary Ms. Gillispie is a 76 y.o.female with past medical history outlined below, currently admitted to the hospital after a reported syncopal event. She has significant dementia and cannot provide any detailed history. Her husband present relates the details as he recalls them. He states that she was not feeling well on Friday, seemed somewhat agitated, also reportedly had a fever. The next day she slept until noon and then had something to eat, did not feel well at that time, looked somewhat diaphoretic and pale. Later on when she was reclining in a chair she was reportedly not responsive to questions, eyes were open. Not entirely clear that she lost consciousness, but she was not interacting.  No history of cardiac arrhythmia based on records. She has been noted to be bradycardic with heart rates in the 50s in sinus rhythm. Telemetry shows intermittent right bundle branch block, in sinus rhythm with occasional PVCs, no pauses. It is not clear that she has had any recent chest pain. Troponin I levels are negative.  Outpatient medication list reviewed including propranolol which has been held at this point. She is also on losartan.   Allergies  Allergen Reactions  . Codeine Swelling and Rash    Swelling in throat  . Morphine And Related Swelling and Rash  . Other Anaphylaxis    Bee Stings  . Phytonadione Other (See Comments)    Patient experienced episode of cyanosis following dose of Vitamin K 10 mg IV.  . Meat Extract     Patient doesn't eat meat   . Promethazine Hcl     Per MD chart - 08/08/11  . Penicillins Rash    Has patient had a PCN reaction causing immediate rash, facial/tongue/throat swelling, SOB or lightheadedness with hypotension: YES Has patient had a PCN reaction causing severe rash involving mucus membranes or skin necrosis: NO Has  patient had a PCN reaction that required hospitalization: NO Has patient had a PCN reaction occurring within the last 10 years: NO If all of the above answers are "NO", then may proceed with Cephalosporin use.     Medications Scheduled Medications: . [START ON 11/03/2016] azithromycin  500 mg Intravenous Q24H  . cefTRIAXone (ROCEPHIN)  IV  1 g Intravenous Q24H  . citalopram  40 mg Oral Daily  . guaiFENesin  600 mg Oral BID  . pantoprazole  40 mg Oral Daily  . sodium chloride flush  3 mL Intravenous Q12H    Infusions: . sodium chloride 75 mL/hr at 11/02/16 0515    PRN Medications: acetaminophen **OR** acetaminophen, albuterol, guaiFENesin, ondansetron **OR** ondansetron (ZOFRAN) IV, polyethylene glycol   Past Medical History:  Diagnosis Date  . Anemia, iron deficiency   . Arthritis   . Bladder cancer [facility]) 2003   Bladder removed - [name]  . Cataract immature   . Cirrhosis ([place]) 08/26/2011  . Colon cancer [facility]) 2001   Hemicolectomy - [name]  . Depression   . GERD (gastroesophageal reflux disease)   . Headache(784.0)   . Hemorrhoids   . History of colon polyps   . History of DVT (deep vein thrombosis) 50+yrs ago   With pregnancy  . History of GI bleed   . History of kidney stones   . History of urostomy    Urostomy d/t hx bladder cancer  . Hx of transfusion of whole blood   . Hypertension   .  Joint pain   . Memory loss    Chemotherapy; short and long term  . Osteoporosis   . Thrombocytopenia ([place])     Past Surgical History:  Procedure Laterality Date  . ABDOMINAL HYSTERECTOMY    . APPENDECTOMY    . BLADDER SURGERY     bladder removed d/t cancer  . CHOLECYSTECTOMY    . COLON RESECTION     with colostomy then colostomy reversal  . COLON SURGERY  2004  . COLON SURGERY  2001  . COLONOSCOPY    . ESOPHAGOGASTRODUODENOSCOPY  02/27/2012   Procedure: ESOPHAGOGASTRODUODENOSCOPY (EGD);  Surgeon: [name], MD;  Location: [name];  Service: Endoscopy;   Laterality: N/A;  bedside case  . NEPHROLITHOTOMY Left 05/04/2014   Procedure: LEFT PERCUTANEOUS NEPHROLITHOTOMY ;  Surgeon: [name], MD;  Location: [facility];  Service: Urology;  Laterality: Left;  . TOTAL KNEE ARTHROPLASTY  2010   Left knee    Family History  Problem Relation Age of Onset  . Diabetes Brother   . Cancer Other   . Hypertension Other     Social History Ms. Ballow reports that she quit smoking about 25 years ago. Her smoking use included Cigarettes. She started smoking about 61 years ago. She has a 17.50 pack-year smoking history. She has never used smokeless tobacco. Ms. Zumstein reports that she does not drink alcohol.  Review of Systems Complete review of systems difficult to obtain in light of patient's dementia. She does not recall the recent events. Currently not reporting any chest pain or shortness of breath.  Physical Examination Blood pressure (!) 174/59, pulse (!) 56, temperature 98 F (36.7 C), temperature source Oral, resp. rate 18, height 5' 5"  (1.651 m), weight 241 lb (109.3 kg), SpO2 98 %. No intake or output data in the 24 hours ending 11/02/16 0958  Telemetry: Sinus rhythm with intermittent right bundle branch block and PVCs, no pauses.  Gen.: Obese woman, no distress. HEENT: Conjunctiva and lids normal, oropharynx clear. Neck: Supple, no elevated JVP, no thyromegaly. Lungs: Scattered rhonchi, no wheezing, nonlabored breathing at rest. Cardiac: Regular rate and rhythm, no S3, soft systolic murmur, no pericardial rub. Abdomen: Soft, nontender, bowel sounds present. Extremities: Trace lower leg edema, distal pulses 2+. Skin: Warm and dry. Musculoskeletal: No kyphosis. Neuropsychiatric: Alert and oriented x1, affect grossly appropriate.   Lab Results  Basic Metabolic Panel:  Recent Labs Lab 11/01/16 2225 11/02/16 0540  NA 137 142  K 3.0* 3.7  CL 105 110  CO2 28 26  GLUCOSE 143* 107*  BUN 15 14  CREATININE 0.74 0.53  CALCIUM 8.8*  8.8*  MG  --  1.7  PHOS  --  2.7    Liver Function Tests:  Recent Labs Lab 11/01/16 2343 11/02/16 0540  AST 31 34  ALT 17 17  ALKPHOS 65 62  BILITOT 1.6* 1.8*  PROT 5.7* 5.5*  ALBUMIN 2.9* 2.9*    CBC:  Recent Labs Lab 11/01/16 2225 11/02/16 0540  WBC 2.8* 2.3*  HGB 10.6* 10.6*  HCT 32.0* 31.9*  MCV 86.7 87.2  PLT 46* 45*    Cardiac Enzymes:  Recent Labs Lab 11/02/16 0540  TROPONINI <0.03    ECG I reviewed her tracing from 11/01/2016 which showed sinus bradycardia at 51 with right bundle branch block.  Imaging  Chest x-ray 11/02/2016: FINDINGS: Shallow inspiration with linear atelectasis or fibrosis in the lung bases similar to prior study. No focal airspace disease or consolidation. Cardiac enlargement without vascular congestion  or edema. Calcified aorta. No pneumothorax. No pleural effusions. Degenerative changes in the spine. Surgical clips in the right upper quadrant.  IMPRESSION: Shallow inspiration with atelectasis or infiltration in the lung bases similar to prior study. No focal consolidation. Cardiac enlargement.  Echocardiogram 03/01/2014: Study Conclusions  - Left ventricle: The cavity size was normal. Wall thickness was increased in a pattern of moderate LVH. Systolic function was normal. The estimated ejection fraction was in the range of 55% to 60%. - Left atrium: The atrium was moderately dilated. - Right atrium: The atrium was moderately dilated. 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 - Pulmonary arteries: PA peak pressure: 52 mm Hg (S).  Impression  1. Recent episode of altered mental status, not entirely clear that it was true syncope based on discussion with the patient's husband today. She has not felt well since Friday intermittently, reportedly had a fever at one point. She is found to be bradycardic, but with a heart rate in the 50s she is asymptomatic at this time. Intermittent right bundle  branch block noted on telemetry, also seen on ECG. Propranolol has been held at this point. Presumably on this as an outpatient with history of cirrhosis, also history of hypertension.  2. Essential hypertension, on losartan as an outpatient.  3. Dementia at baseline, limits history.   Recommendations  Would continue telemetry monitoring to observe for any substantial bradycardia or pauses that might require further intervention. Agree with holding propranolol for now. Echocardiogram has also been ordered.  Satira Sark, M.D., F.A.C.C.

## 2016-11-03 DIAGNOSIS — F0391 Unspecified dementia with behavioral disturbance: Secondary | ICD-10-CM

## 2016-11-03 DIAGNOSIS — C679 Malignant neoplasm of bladder, unspecified: Secondary | ICD-10-CM

## 2016-11-03 DIAGNOSIS — D72818 Other decreased white blood cell count: Secondary | ICD-10-CM

## 2016-11-03 DIAGNOSIS — F0281 Dementia in other diseases classified elsewhere with behavioral disturbance: Secondary | ICD-10-CM

## 2016-11-03 DIAGNOSIS — Z936 Other artificial openings of urinary tract status: Secondary | ICD-10-CM

## 2016-11-03 DIAGNOSIS — G308 Other Alzheimer's disease: Secondary | ICD-10-CM

## 2016-11-03 DIAGNOSIS — D696 Thrombocytopenia, unspecified: Secondary | ICD-10-CM

## 2016-11-03 DIAGNOSIS — E876 Hypokalemia: Secondary | ICD-10-CM

## 2016-11-03 DIAGNOSIS — I10 Essential (primary) hypertension: Secondary | ICD-10-CM

## 2016-11-03 LAB — BASIC METABOLIC PANEL
Anion gap: 7 (ref 5–15)
BUN: 15 mg/dL (ref 6–20)
CALCIUM: 8.6 mg/dL — AB (ref 8.9–10.3)
CO2: 25 mmol/L (ref 22–32)
CREATININE: 0.65 mg/dL (ref 0.44–1.00)
Chloride: 109 mmol/L (ref 101–111)
GFR calc Af Amer: >60 mL/min (ref >60)
GLUCOSE: 100 mg/dL — AB (ref 65–99)
Potassium: 3.6 mmol/L (ref 3.5–5.1)
Sodium: 141 mmol/L (ref 135–145)

## 2016-11-03 LAB — CBC
HCT: 30.8 % — ABNORMAL LOW (ref 36.0–46.0)
Hemoglobin: 10.2 g/dL — ABNORMAL LOW (ref 12.0–15.0)
MCH: 29 pg (ref 26.0–34.0)
MCHC: 33.1 g/dL (ref 30.0–36.0)
MCV: 87.5 fL (ref 78.0–100.0)
PLATELETS: 47 10*3/uL — AB (ref 150–400)
RBC: 3.52 MIL/uL — ABNORMAL LOW (ref 3.87–5.11)
RDW: 17.8 % — AB (ref 11.5–15.5)
WBC: 2.2 10*3/uL — ABNORMAL LOW (ref 4.0–10.5)

## 2016-11-03 LAB — GLUCOSE, CAPILLARY
GLUCOSE-CAPILLARY: 274 mg/dL — AB (ref 65–99)
GLUCOSE-CAPILLARY: 108 mg/dL — AB (ref 65–99)
Glucose-Capillary: 125 mg/dL — ABNORMAL HIGH (ref 65–99)
Glucose-Capillary: 169 mg/dL — ABNORMAL HIGH (ref 65–99)

## 2016-11-03 LAB — HEMOGLOBIN A1C
HEMOGLOBIN A1C: 5.1 % (ref 4.8–5.6)
Mean Plasma Glucose: 100 mg/dL

## 2016-11-03 MED ORDER — DOXYCYCLINE HYCLATE 100 MG PO TABS: 100.0000 mg | ORAL_TABLET | Freq: Two times a day (BID) | ORAL | Status: DC

## 2016-11-03 NOTE — Progress Notes (Signed)
PROGRESS NOTE    Erica RON  Robertson:[id number] DOB: Feb 08, 1941 DOA: 11/01/2016 PCP: Odette Fraction, MD   Chief Complaint  Patient presents with  . Loss of Consciousness    Brief Narrative:  HPI on 11/02/2016 by Dr. Phillips [name] is a 76 y.o. 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  Presented with syncope patient's son reported the patient was sitting in the chair and sudden became pale and diaphoretic and fainted. Some reported as her heart rate dropped to 48. Husband reported fever they were not able to check it with thermometer. She have had intermittent back  pain for the past 1-2 weeks EMS was called [facility] on arrival was 185 has been progressively getting weak for past few days. Haven't endorsed any chest pain or shortness of breath patient unable to provide her own history secondary to severe dementia. Family reports caregiver recently diagnosed with Influenza Patient seemed to be more confused than her baseline but denies any localized weakness  Ellyn Hack back pain patient has history of disc inflammation for which she has been seeing her primary care doctor in January Regarding pertinent Chronic problems: As been seen by oncology for her iron deficiency anemia has been taking iron infusions responded well, and 2001 she has underwent hemicolectomy for colon cancer Bladder was removed in 2008 and she status post urostomy she wears that chronically.  Assessment & Plan   Syncope -Unknown etiology -patient had some bradycardia -Continue tele monitoring -Echocardiogram EF 40-10%, grade 2 diastolic dysfunction -Carotid doppler: 1-39% internal carotid artery stenosis bilaterally. Vertebral arteries are patent  with antegrade flow. -Patient also noted to have RBBB (new since 2015) -Cardiology consulted and appreciated, pending further recommendations -PT consult  Essential hypertension -Propranolol held  Bradycardia -propanolol held -Continue to monitor closely  Thrombocytopenia, chronic -Platelets 47, continue to monitor CBC -thought to be secondary to liver disease  Sepsis secondary to UTI -patient presented with fever and leukopenia -Placed on azithromycin and ceftriaxone -Chest x-ray unremarkable for infection  -UA showed TNTC WBC, rare bacteria, large leukocytes, negative nitrites  -Urine culture shows >100K morganella morganii -Blood cultures show no growth to date -Pending sensitivities   History of bladder cancer/colostomy -s/p urostomy and hemicolectomy   Hypokalemia  Acute encephalopathy -Possibly secondary to the above -Continue to monitor closely  ?history of cirrhosis  DVT Prophylaxis  SCDs  Code Status: DNR  Family Communication: Family at bedside  Disposition Plan: Admitted. Pending urine sensitivities and further recommendations from cardiology.  Pending PT. Suspect discharge in 1-2 days.  Consultants Cardiology  Procedures  Carotid doppler  Antibiotics   Anti-infectives    Start     Dose/Rate Route Frequency Ordered Stop   11/04/16 1000  doxycycline (VIBRA-TABS) tablet 100 mg     100 mg Oral Every 12 hours 11/03/16 0839     11/03/16 0600  azithromycin (ZITHROMAX) 500 mg in dextrose 5 % 250 mL IVPB  Status:  Discontinued     500 mg 250 mL/hr over 60 Minutes Intravenous Every 24 hours 11/02/16 0518 11/03/16 0839   11/02/16 2359  cefTRIAXone (ROCEPHIN) 1 g in dextrose 5 % 50 mL IVPB     1 g 100 mL/hr over 30 Minutes Intravenous Every 24 hours 11/02/16 0518     11/02/16 0515  azithromycin (ZITHROMAX)  500 mg in dextrose 5 % 250 mL IVPB     500 mg 250 mL/hr over 60 Minutes Intravenous  Once 11/02/16 0503 11/02/16 1218   11/02/16 0030  cefTRIAXone  (ROCEPHIN) 1 g in dextrose 5 % 50 mL IVPB     1 g 100 mL/hr over 30 Minutes Intravenous  Once 11/02/16 0016 11/02/16 0116      Subjective:   [name] seen and examined today.  Patient does have dementia. Patient feels sleepy today. Denies chest pain, shortness of breath, abdominal pain, nausea or vomiting, diarrhea, or constipation.  Objective:   Vitals:   11/02/16 0517 11/02/16 1655 11/02/16 2246 11/03/16 0612  BP: (!) 174/59 114/81 (!) 154/51 (!) 154/50  Pulse: (!) 56 60 (!) 54 (!) 57  Resp: 18 18 18 18   Temp: 98 F (36.7 C) 98.3 F (36.8 C) 98.3 F (36.8 C) 97.9 F (36.6 C)  TempSrc: Oral Oral Oral Oral  SpO2: 98% 97% 98% 97%  Weight: 109.3 kg (241 lb)     Height: 5' 5"  (1.651 m)       Intake/Output Summary (Last 24 hours) at 11/03/16 1157 Last data filed at 11/03/16 0762  Gross per 24 hour  Intake              540 ml  Output             1050 ml  Net             -510 ml   Filed Weights   11/02/16 0517  Weight: 109.3 kg (241 lb)    Exam  General: Well developed, well nourished, NAD, appears stated age  HEENT: NCAT, mucous membranes moist.   Cardiovascular: S1 S2 auscultated, RRR, 2/6 SEM  Respiratory: Clear to auscultation bilaterally with equal chest rise  Abdomen: Soft, nontender, nondistended, + bowel sounds  Extremities: warm dry without cyanosis clubbing. Trace LE edema.  Neuro: AAOx1 (self), has dementia, nonfocal  Skin: Without rashes exudates or nodules  Psych: Normal affect and demeanor with intact judgement and insight   Data Reviewed: I have personally reviewed following labs and imaging studies  CBC:  Recent Labs Lab 11/01/16 2225 11/02/16 0540 11/03/16 0350  WBC 2.8* 2.3* 2.2*  HGB 10.6* 10.6* 10.2*  HCT 32.0* 31.9* 30.8*  MCV 86.7 87.2 87.5  PLT 46* 45* 47*   Basic Metabolic Panel:  Recent Labs Lab 11/01/16 2225 11/02/16 0540 11/03/16 0350  NA 137 142 141  K 3.0* 3.7 3.6  CL 105 110 109  CO2 28 26 25     GLUCOSE 143* 107* 100*  BUN 15 14 15   CREATININE 0.74 0.53 0.65  CALCIUM 8.8* 8.8* 8.6*  MG  --  1.7  --   PHOS  --  2.7  --    GFR: Estimated Creatinine Clearance: 72.4 mL/min (by C-G formula based on SCr of 0.65 mg/dL). Liver Function Tests:  Recent Labs Lab 11/01/16 2343 11/02/16 0540  AST 31 34  ALT 17 17  ALKPHOS 65 62  BILITOT 1.6* 1.8*  PROT 5.7* 5.5*  ALBUMIN 2.9* 2.9*   No results for input(s): LIPASE, AMYLASE in the last 168 hours.  Recent Labs Lab 11/01/16 2344  AMMONIA 66*   Coagulation Profile: No results for input(s): INR, PROTIME in the last 168 hours. Cardiac Enzymes:  Recent Labs Lab 11/02/16 0540 11/02/16 1212 11/02/16 1821  TROPONINI <0.03 <0.03 <0.03   BNP (last 3 results) No results for input(s): PROBNP in the last 8760 hours.  HbA1C:  Recent Labs  11/02/16 0540  HGBA1C 5.1   CBG:  Recent Labs Lab 11/02/16 1212 11/02/16 1654 11/02/16 2242 11/03/16 0733 11/03/16 1151  GLUCAP 123* 132* 104* 125* 274*   Lipid Profile: No results for input(s): CHOL, HDL, LDLCALC, TRIG, CHOLHDL, LDLDIRECT in the last 72 hours. Thyroid Function Tests:  Recent Labs  11/02/16 0540  TSH 0.659   Anemia Panel: No results for input(s): VITAMINB12, FOLATE, FERRITIN, TIBC, IRON, RETICCTPCT in the last 72 hours. Urine analysis:    Component Value Date/Time   COLORURINE AMBER (A) 11/01/2016 2345   APPEARANCEUR HAZY (A) 11/01/2016 2345   LABSPEC 1.014 11/01/2016 2345   PHURINE 7.0 11/01/2016 2345   GLUCOSEU NEGATIVE 11/01/2016 2345   HGBUR NEGATIVE 11/01/2016 [place] 11/01/2016 2345   KETONESUR NEGATIVE 11/01/2016 2345   PROTEINUR NEGATIVE 11/01/2016 2345   UROBILINOGEN 1.0 04/06/2014 1856   NITRITE NEGATIVE 11/01/2016 2345   LEUKOCYTESUR LARGE (A) 11/01/2016 2345   Sepsis Labs: @LABRCNTIP (procalcitonin:4,lacticidven:4)  ) Recent Results (from the past 240 hour(s))  Urine culture     Status: Abnormal (Preliminary  result)   Collection Time: 11/01/16 11:45 PM  Result Value Ref Range Status   Specimen Description URINE, CATHETERIZED  Final   Special Requests NONE  Final   Culture >=100,000 COLONIES/mL MORGANELLA MORGANII (A)  Final   Report Status PENDING  Incomplete  Culture, blood (x 2)     Status: None (Preliminary result)   Collection Time: 11/02/16  5:38 AM  Result Value Ref Range Status   Specimen Description BLOOD RIGHT ANTECUBITAL  Final   Special Requests IN PEDIATRIC BOTTLE 1CC  Final   Culture   Final    NO GROWTH 1 DAY Performed at [facility], [place] [address] [place], [place] [address]    Report Status PENDING  Incomplete  Culture, blood (x 2)     Status: None (Preliminary result)   Collection Time: 11/02/16  5:38 AM  Result Value Ref Range Status   Specimen Description BLOOD LEFT HAND  Final   Special Requests IN PEDIATRIC BOTTLE 2CC  Final   Culture   Final    NO GROWTH 1 DAY Performed at [facility], [place] [address] [place], [place] [address]    Report Status PENDING  Incomplete      Radiology Studies: Dg Chest 2 View  Result Date: 11/02/2016 CLINICAL DATA:  Sudden onset severe confusion today. History of bladder cancer, colon cancer, dementia, and hypertension. Back pain and abdominal pain for 2 weeks. Weakness. EXAM: CHEST  2 VIEW COMPARISON:  07/11/2016 FINDINGS: Shallow inspiration with linear atelectasis or fibrosis in the lung bases similar to prior study. No focal airspace disease or consolidation. Cardiac enlargement without vascular congestion or edema. Calcified aorta. No pneumothorax. No pleural effusions. Degenerative changes in the spine. Surgical clips in the right upper quadrant. IMPRESSION: Shallow inspiration with atelectasis or infiltration in the lung bases similar to prior study. No focal consolidation. Cardiac enlargement. Electronically Signed   By: Lucienne Capers M.D.   On: 11/02/2016 00:45     Scheduled Meds: . cefTRIAXone (ROCEPHIN)   IV  1 g Intravenous Q24H  . citalopram  40 mg Oral Daily  . [START ON 11/04/2016] doxycycline  100 mg Oral Q12H  . guaiFENesin  600 mg Oral BID  . pantoprazole  40 mg Oral Daily  . sodium chloride flush  3 mL Intravenous Q12H   Continuous Infusions:    LOS: 1  day   Time Spent in minutes   30 minutes  Deovion Batrez D.O. on 11/03/2016 at 11:57 AM  Between 7am to 7pm - Pager - [phone]  After 7pm go to [email or web address] - password [email or web address]  And look for the night coverage person covering for me after hours  [facility] Office  [phone]

## 2016-11-03 NOTE — Evaluation (Signed)
Physical Therapy Evaluation Patient Details Name: [name] MRN: [id number] DOB: [birth year] Today's Date: 11/03/2016   History of Present Illness  76 yo female admitted with syncope. Hx of dementia, bladder ca, colon ca, anemia, back pain.   Clinical Impression  On eval, pt required Min guard-Min assist for mobility. She walked ~150 feet with a RW. HR 60 bpm, O2 sats 96% on RA, dyspnea 2/4. Pt participated well with therapy. Husband present during session. Discussed d/c plan-feel pt will return to baseline in a few day-husband agreed. Will follow during hospital stay.     Follow Up Recommendations No PT follow up;Supervision/Assistance - 24 hour    Equipment Recommendations  None recommended by PT    Recommendations for Other Services       Precautions / Restrictions Precautions Precautions: Fall Restrictions Weight Bearing Restrictions: No      Mobility  Bed Mobility Overal bed mobility: Modified Independent                Transfers Overall transfer level: Needs assistance Equipment used: Rolling walker (2 wheeled) Transfers: Sit to/from Stand Sit to Stand: Min guard;Min assist;From elevated surface         General transfer comment: Min guard from bed. Min assist from low toilet surface. VCs hand placement.   Ambulation/Gait Ambulation/Gait assistance: Min guard Ambulation Distance (Feet): 150 Feet Assistive device: Rolling walker (2 wheeled) Gait Pattern/deviations: Step-through pattern;Decreased stride length     General Gait Details: close guard for safety. Pt denied dizziness/lightheadedness.   Stairs            Wheelchair Mobility    Modified Rankin (Stroke Patients Only)       Balance Overall balance assessment: Needs assistance         Standing balance support: Bilateral upper extremity supported Standing balance-Leahy Scale: Poor Standing balance comment: requires RW                             Pertinent  Vitals/Pain Pain Assessment: No/denies pain    Home Living Family/patient expects to be discharged to:: Private residence Living Arrangements: Spouse/significant other Available Help at Discharge: Personal care attendant (3x/week, 4 hours/day) Type of Home: House Home Access: Ramped entrance     Home Layout: Two level;Able to live on main level with bedroom/bathroom Home Equipment: Walker - 4 wheels;Cane - single point;Bedside commode;Shower seat;Grab bars - tub/shower      Prior Function Level of Independence: Independent with assistive device(s)         Comments: mod Ind with walker     Hand Dominance        Extremity/Trunk Assessment   Upper Extremity Assessment Upper Extremity Assessment: Overall WFL for tasks assessed    Lower Extremity Assessment Lower Extremity Assessment: Generalized weakness    Cervical / Trunk Assessment Cervical / Trunk Assessment: Normal  Communication   Communication: No difficulties  Cognition Arousal/Alertness: Awake/alert Behavior During Therapy: WFL for tasks assessed/performed Overall Cognitive Status: History of cognitive impairments - at baseline                      General Comments      Exercises     Assessment/Plan    PT Assessment Patient needs continued PT services  PT Problem List Decreased strength;Decreased mobility;Decreased cognition          PT Treatment Interventions DME instruction;Gait training;Therapeutic activities;Therapeutic exercise;Patient/family education;Balance training;Functional mobility training  PT Goals (Current goals can be found in the Care Plan section)  Acute Rehab PT Goals Patient Stated Goal: home soon PT Goal Formulation: With family Time For Goal Achievement: 11/17/16 Potential to Achieve Goals: Good    Frequency Min 3X/week   Barriers to discharge        Co-evaluation               End of Session Equipment Utilized During Treatment: Gait  belt Activity Tolerance: Patient tolerated treatment well Patient left: in chair;with call bell/phone within reach;with family/visitor present;with chair alarm set           Time: 2574-9355 PT Time Calculation (min) (ACUTE ONLY): 27 min   Charges:   PT Evaluation $PT Eval Low Complexity: 1 Procedure PT Treatments $Gait Training: 8-22 mins   PT G Codes:        Weston Anna, MPT Pager: [phone]

## 2016-11-03 NOTE — Progress Notes (Signed)
The patient has been seen in conjunction with Rosaria Ferries, PA-C. All aspects of care have been considered and discussed. The patient has been personally interviewed, examined, and all clinical data has been reviewed.   Presentation is with that of confusion, encephalopathy, dementia, fever,....  Telemetry is not demonstrated any actionable rhythm disturbance.  Cardiac markers are negative. LV function is normal.  Plan clinical observation. No specific further evaluation if arrhythmias did not occur on telemetry.  Progress Note  Patient Name: [name] Date of Encounter: 11/03/2016  Primary Cardiologist: New, Dr Tamala Julian  Subjective   Sleeps unless roused, responds to verbal but confused. She appeared awake but did not responds to verbal stimulus at the time of the syncope/presyncope.  Inpatient Medications    Scheduled Meds: . cefTRIAXone (ROCEPHIN)  IV  1 g Intravenous Q24H  . citalopram  40 mg Oral Daily  . [START ON 11/04/2016] doxycycline  100 mg Oral Q12H  . guaiFENesin  600 mg Oral BID  . pantoprazole  40 mg Oral Daily  . sodium chloride flush  3 mL Intravenous Q12H   Continuous Infusions:  PRN Meds: acetaminophen **OR** acetaminophen, albuterol, guaiFENesin, ondansetron **OR** ondansetron (ZOFRAN) IV, polyethylene glycol   Vital Signs    Vitals:   11/02/16 0517 11/02/16 1655 11/02/16 2246 11/03/16 0612  BP: (!) 174/59 114/81 (!) 154/51 (!) 154/50  Pulse: (!) 56 60 (!) 54 (!) 57  Resp: 18 18 18 18   Temp: 98 F (36.7 C) 98.3 F (36.8 C) 98.3 F (36.8 C) 97.9 F (36.6 C)  TempSrc: Oral Oral Oral Oral  SpO2: 98% 97% 98% 97%  Weight: 241 lb (109.3 kg)     Height: 5' 5"  (1.651 m)       Intake/Output Summary (Last 24 hours) at 11/03/16 0859 Last data filed at 11/03/16 9357  Gross per 24 hour  Intake              780 ml  Output             1050 ml  Net             -270 ml   Filed Weights   11/02/16 0517  Weight: 241 lb (109.3 kg)     Telemetry    SR, PVCs, 1 pair - Personally Reviewed  ECG    n/a - Personally Reviewed  Physical Exam   General: Well developed, well nourished, female appearing in no acute distress. Head: Normocephalic, atraumatic.  Neck: Supple without bruits, JVD not elevated. Lungs:  Resp regular and unlabored, CTA. Heart: RRR, S1, S2, no S3, S4, 2/6 murmur; no rub. Abdomen: Soft, non-tender, non-distended with normoactive bowel sounds. No hepatomegaly. No rebound/guarding. No obvious abdominal masses. Extremities: No clubbing, cyanosis, no significant edema. Distal pedal pulses are 2+ bilaterally. Neuro: Alert and oriented X 2. Moves all extremities spontaneously. Psych: Normal affect.  Labs    Chemistry Recent Labs Lab 11/01/16 2225 11/01/16 2343 11/02/16 0540 11/03/16 0350  NA 137  --  142 141  K 3.0*  --  3.7 3.6  CL 105  --  110 109  CO2 28  --  26 25  GLUCOSE 143*  --  107* 100*  BUN 15  --  14 15  CREATININE 0.74  --  0.53 0.65  CALCIUM 8.8*  --  8.8* 8.6*  PROT  --  5.7* 5.5*  --   ALBUMIN  --  2.9* 2.9*  --   AST  --  31 34  --   ALT  --  17 17  --   ALKPHOS  --  65 62  --   BILITOT  --  1.6* 1.8*  --   GFRNONAA >60  --  >60 >60  GFRAA >60  --  >60 >60  ANIONGAP 4*  --  6 7     Hematology Recent Labs Lab 11/01/16 2225 11/02/16 0540 11/03/16 0350  WBC 2.8* 2.3* 2.2*  RBC 3.69* 3.66* 3.52*  HGB 10.6* 10.6* 10.2*  HCT 32.0* 31.9* 30.8*  MCV 86.7 87.2 87.5  MCH 28.7 29.0 29.0  MCHC 33.1 33.2 33.1  RDW 17.6* 17.5* 17.8*  PLT 46* 45* 47*    Cardiac Enzymes Recent Labs Lab 11/02/16 0540 11/02/16 1212 11/02/16 1821  TROPONINI <0.03 <0.03 <0.03    Recent Labs Lab 11/02/16 0014  TROPIPOC 0.00    Radiology    Dg Chest 2 View  Result Date: 11/02/2016 CLINICAL DATA:  Sudden onset severe confusion today. History of bladder cancer, colon cancer, dementia, and hypertension. Back pain and abdominal pain for 2 weeks. Weakness. EXAM: CHEST  2 VIEW  COMPARISON:  07/11/2016 FINDINGS: Shallow inspiration with linear atelectasis or fibrosis in the lung bases similar to prior study. No focal airspace disease or consolidation. Cardiac enlargement without vascular congestion or edema. Calcified aorta. No pneumothorax. No pleural effusions. Degenerative changes in the spine. Surgical clips in the right upper quadrant. IMPRESSION: Shallow inspiration with atelectasis or infiltration in the lung bases similar to prior study. No focal consolidation. Cardiac enlargement. Electronically Signed   By: Lucienne Capers M.D.   On: 11/02/2016 00:45    Cardiac Studies   ECHO: 11/02/2016 - Left ventricle: The cavity size was normal. Wall thickness was   normal. Systolic function was normal. The estimated ejection   fraction was in the range of 60% to 65%. Wall motion was normal;   there were no regional wall motion abnormalities. Features are   consistent with a pseudonormal left ventricular filling pattern,   with concomitant abnormal relaxation and increased filling   pressure (grade 2 diastolic dysfunction). - Mitral valve: Calcified annulus. - Left atrium: The atrium was severely dilated. - Right atrium: The atrium was moderately dilated. Impressions: - Compared to the prior study, there has been no significant   interval change.  Patient Profile     76 y.o. female with a history of dementia, anemia, GERD, HTN, thrombocytopenia, cirrhosis, bladder CA. Admitted 02/11 with near-syncope.  Assessment & Plan    1. Syncope   Near syncope - intermittent RBBB on telemetry - HR 50s>> propranolol held - No significant arrhythmia overnight - happened in the setting of AMS, diaphoresis, pallor, ?fever - po intake had been down in the last 24-48 hours - HR 48 not low enough to cause syncope - RBBB is new from 2015 - EF normal on echo - MD advise on further eval, ?event monitor  Otherwise, per IM Active Problems:   Bladder cancer ([place])    Thrombocytopenia ([place])   Dementia   Leukopenia   Presence of urostomy ([place])   Benign essential HTN   Hypokalemia   Acute encephalopathy    Signed, Rosaria Ferries , PA-C 8:59 AM 11/03/2016 Pager: [phone]

## 2016-11-04 LAB — BASIC METABOLIC PANEL
ANION GAP: 6 (ref 5–15)
BUN: 14 mg/dL (ref 6–20)
CO2: 24 mmol/L (ref 22–32)
Calcium: 8.6 mg/dL — ABNORMAL LOW (ref 8.9–10.3)
Chloride: 108 mmol/L (ref 101–111)
Creatinine, Ser: 0.74 mg/dL (ref 0.44–1.00)
GLUCOSE: 93 mg/dL (ref 65–99)
POTASSIUM: 3.5 mmol/L (ref 3.5–5.1)
Sodium: 138 mmol/L (ref 135–145)

## 2016-11-04 LAB — GLUCOSE, CAPILLARY
GLUCOSE-CAPILLARY: 153 mg/dL — AB (ref 65–99)
Glucose-Capillary: 91 mg/dL (ref 65–99)

## 2016-11-04 LAB — CBC
HEMATOCRIT: 30.2 % — AB (ref 36.0–46.0)
Hemoglobin: 10 g/dL — ABNORMAL LOW (ref 12.0–15.0)
MCH: 29.1 pg (ref 26.0–34.0)
MCHC: 33.1 g/dL (ref 30.0–36.0)
MCV: 87.8 fL (ref 78.0–100.0)
Platelets: 38 10*3/uL — ABNORMAL LOW (ref 150–400)
RBC: 3.44 MIL/uL — AB (ref 3.87–5.11)
RDW: 17.8 % — ABNORMAL HIGH (ref 11.5–15.5)
WBC: 2.3 10*3/uL — AB (ref 4.0–10.5)

## 2016-11-04 LAB — URINE CULTURE: Culture: >=100000 — AB

## 2016-11-04 MED ORDER — CEFPODOXIME PROXETIL 200 MG PO TABS: 200.0000 mg | ORAL_TABLET | Freq: Two times a day (BID) | ORAL | Status: DC

## 2016-11-04 MED ORDER — CITALOPRAM HYDROBROMIDE 20 MG PO TABS: 20.0000 mg | ORAL_TABLET | Freq: Every day | ORAL | Status: DC

## 2016-11-04 MED ORDER — CITALOPRAM HYDROBROMIDE 20 MG PO TABS: 20.0000 mg | ORAL_TABLET | Freq: Every day | ORAL | 0 refills | Status: DC

## 2016-11-04 MED ORDER — CEFPODOXIME PROXETIL 200 MG PO TABS: 200.0000 mg | ORAL_TABLET | Freq: Two times a day (BID) | ORAL | 0 refills | Status: DC

## 2016-11-04 NOTE — Discharge Summary (Addendum)
Physician Discharge Summary  [name] UUV:[id number] DOB: 10-09-1940 DOA: 11/01/2016  PCP: Odette Fraction, MD  Admit date: 11/01/2016 Discharge date: 11/04/2016  Time spent: 45 minutes  Recommendations for Outpatient Follow-up:  Patient will be discharged to home.  Patient will need to follow up with primary care provider within one week of discharge, monitor BMP and CBC.  Follow up with cardiology, Dr. Tamala Julian.  Patient should continue medications as prescribed.  Patient should follow a heart healthy diet.   Discharge Diagnoses:  Syncope Essential hypertension Bradycardia Chronic pancytopenia  Sepsis secondary to UTI History of bladder cancer/colostomy Hypokalemia Acute encephalopathy ?history of cirrhosis  Discharge Condition: Stable  Diet recommendation: heart healthy  Filed Weights   11/02/16 0517  Weight: 109.3 kg (241 lb)    History of present illness:  on 11/02/2016 by Dr. 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  Presented with syncope patient's son reported the patient was sitting in the chair and sudden became pale and diaphoretic and fainted. Some reported as her heart rate dropped to 48. Husband reported fever they were not able to check it with thermometer. She have had intermittent back pain for the past 1-2 weeks EMS was called [facility] on arrival was 185 has been progressively getting weak for past few days. Haven't endorsed any chest pain or shortness of breath patient unable to provide her own history secondary to severe dementia. Family reports caregiver recently diagnosed with Influenza Patient seemed to be more confused than her baseline but denies any  localized weakness [place] back pain patient has history of disc inflammation for which she has been seeing her primary care doctor in January Regarding pertinent Chronic problems: As been seen by oncology for her iron deficiency anemia has been taking iron infusions responded well, and 2001 she has underwent hemicolectomy for colon cancer Bladder was removed in 2008 and she status post urostomy she wears that chronically.  Hospital Course:  Syncope -Unknown etiology -patient had some bradycardia. -no arrhythmias on tele -Echocardiogram EF 47-42%, grade 2 diastolic dysfunction -Carotid doppler: 1-39% internal carotid artery stenosis bilaterally. Vertebral arteries are patent with antegrade flow. -Patient also noted to have RBBB (new since 2015) -Cardiology consulted and appreciated, clinical observation  -PT consulted- no further needs  Essential hypertension -Propranolol held -Resume losartan  Bradycardia -propanolol held -Continue to monitor closely  Chronic pancytopenia  -Platelets 38, Hemoglobin 10, WBC 2.3 -Repeat CBC in one week  -thought to be secondary to liver disease  Sepsis secondary to UTI -patient presented with fever and leukopenia -Placed on azithromycin and ceftriaxone -Chest x-ray unremarkable for infection  -UA showed TNTC WBC, rare bacteria, large leukocytes, negative nitrites  -Urine culture shows >100K morganella morganii -Blood cultures show no growth to date -Will discharge patient on Vantin  History of bladder cancer/colostomy -s/p urostomy and hemicolectomy   Hypokalemia -potassium 3.5 -Repeat BMP in one week  Acute encephalopathy -Possibly secondary to the above.  Patient does have dementia at baseline -Continue to monitor closely  ?history of cirrhosis  Prolonged QTC -Reducing Celexa  Depression -Continue celexa, but reduced dose  Consultants Cardiology  Procedures  Carotid doppler  Discharge Exam: Vitals:   11/03/16  2042 11/04/16 0555  BP: (!) 141/60 (!) 145/51  Pulse: 60 (!) 56  Resp:  17 16  Temp: 98.3 F (36.8 C) 98 F (36.7 C)    Patient does have dementia. Denies chest pain, shortness of breath, abdominal pain, nausea or vomiting, diarrhea, or constipation.  Exam  General: Well developed, well nourished, NAD  HEENT: NCAT, mucous membranes moist.   Cardiovascular: S1 S2 auscultated, RRR, 2/6 SEM  Respiratory: Clear to auscultation bilaterally with equal chest rise  Abdomen: Soft, nontender, nondistended, + bowel sounds  Extremities: warm dry without cyanosis clubbing, no edema  Neuro: AAOx1 (self), has dementia, nonfocal  Skin: Without rashes exudates or nodules  Psych: Appropriate mood and affect  Discharge Instructions Discharge Instructions    Discharge instructions    Complete by:  As directed    Patient will be discharged to home.  Patient will need to follow up with primary care provider within one week of discharge, monitor BMP and CBC.  Follow up with cardiology, Dr. Tamala Julian.  Patient should continue medications as prescribed.  Patient should follow a heart healthy diet.     Current Discharge Medication List    START taking these medications   Details  cefpodoxime (VANTIN) 200 MG tablet Take 1 tablet (200 mg total) by mouth every 12 (twelve) hours. Qty: 10 tablet, Refills: 0      CONTINUE these medications which have CHANGED   Details  citalopram (CELEXA) 20 MG tablet Take 1 tablet (20 mg total) by mouth daily. Qty: 30 tablet, Refills: 0      CONTINUE these medications which have NOT CHANGED   Details  acidophilus (RISAQUAD) CAPS capsule Take 1 capsule by mouth daily.    Ascorbic Acid (VITAMIN C) 1000 MG tablet Take 1,000 mg by mouth daily.   Associated Diagnoses: Anemia, iron deficiency; Thrombocytopenia (HCC)    Cholecalciferol (VITAMIN D3) 1000 UNITS CAPS Take 1,000 Units by mouth daily.     Cinnamon 500 MG capsule Take 500 mg by mouth daily.       Cyanocobalamin (VITAMIN B 12 PO) Take 1 tablet by mouth every morning.    Associated Diagnoses: Thrombocytopenia ([place]); Anemia, iron deficiency    fexofenadine (ALLEGRA) 180 MG tablet Take 180 mg by mouth daily.    fish oil-omega-3 fatty acids 1000 MG capsule Take 1,200 mg by mouth daily.     Ginkgo Biloba 60 MG TABS Take 60 mg by mouth every morning.    Associated Diagnoses: Anemia, iron deficiency; Colon cancer ([place]); Malignant neoplasm of urinary bladder, unspecified site (HCC)    guaiFENesin (MUCINEX) 600 MG 12 hr tablet Take 600 mg by mouth 2 (two) times daily as needed for cough or to loosen phlegm.    losartan (COZAAR) 25 MG tablet TAKE 1 TABLET BY MOUTH EVERY DAY, STOP RAMIPRIL Qty: 30 tablet, Refills: 11   Associated Diagnoses: Benign essential HTN    Multiple Vitamin (MULTIVITAMIN WITH MINERALS) TABS tablet Take 1 tablet by mouth daily.    pantoprazole (PROTONIX) 40 MG tablet Take 1 tablet (40 mg total) by mouth daily. Qty: 30 tablet, Refills: 11    triamcinolone cream (KENALOG) 0.1 % Apply 1 application topically 2 (two) times daily. Qty: 30 g, Refills: 0   Associated Diagnoses: Left-sided low back pain without sciatica, unspecified chronicity    vitamin E 100 UNIT capsule Take 400 Units by mouth daily.    Associated Diagnoses: Anemia, iron deficiency; Thrombocytopenia (HCC)      STOP taking these medications     propranolol (INDERAL) 10 MG tablet      nystatin cream (MYCOSTATIN)  potassium chloride (K-DUR) 10 MEQ tablet      predniSONE (DELTASONE) 20 MG tablet        Allergies  Allergen Reactions  . Codeine Swelling and Rash    Swelling in throat  . Morphine And Related Swelling and Rash  . Other Anaphylaxis    Bee Stings  . Phytonadione Other (See Comments)    Patient experienced episode of cyanosis following dose of Vitamin K 10 mg IV.  . Meat Extract     Patient doesn't eat meat   . Promethazine Hcl     Per MD chart - 08/08/11  . Penicillins  Rash    Has patient had a PCN reaction causing immediate rash, facial/tongue/throat swelling, SOB or lightheadedness with hypotension: YES Has patient had a PCN reaction causing severe rash involving mucus membranes or skin necrosis: NO Has patient had a PCN reaction that required hospitalization: NO Has patient had a PCN reaction occurring within the last 10 years: NO If all of the above answers are "NO", then may proceed with Cephalosporin use.    Follow-up Information    PICKARD,WARREN TOM, MD. Schedule an appointment as soon as possible for a visit in 1 week(s).   Specialty:  Family Medicine Why:  Hospital follow up Contact information: [address] [place] [address] [phone]        Henry W Smith III, MD. Schedule an appointment as soon as possible for a visit in 2 week(s).   Specialty:  Cardiology Why:  Hospital follow up Contact information: [address] [place] [address] [phone]            The results of significant diagnostics from this hospitalization (including imaging, microbiology, ancillary and laboratory) are listed below for reference.    Significant Diagnostic Studies: Dg Chest 2 View  Result Date: 11/02/2016 CLINICAL DATA:  Sudden onset severe confusion today. History of bladder cancer, colon cancer, dementia, and hypertension. Back pain and abdominal pain for 2 weeks. Weakness. EXAM: CHEST  2 VIEW COMPARISON:  07/11/2016 FINDINGS: Shallow inspiration with linear atelectasis or fibrosis in the lung bases similar to prior study. No focal airspace disease or consolidation. Cardiac enlargement without vascular congestion or edema. Calcified aorta. No pneumothorax. No pleural effusions. Degenerative changes in the spine. Surgical clips in the right upper quadrant. IMPRESSION: Shallow inspiration with atelectasis or infiltration in the lung bases similar to prior study. No focal consolidation. Cardiac enlargement.  Electronically Signed   By: Lucienne Capers M.D.   On: 11/02/2016 00:45    Microbiology: Recent Results (from the past 240 hour(s))  Urine culture     Status: Abnormal   Collection Time: 11/01/16 11:45 PM  Result Value Ref Range Status   Specimen Description URINE, CATHETERIZED  Final   Special Requests NONE  Final   Culture >=100,000 COLONIES/mL MORGANELLA MORGANII (A)  Final   Report Status 11/04/2016 FINAL  Final   Organism ID, Bacteria MORGANELLA MORGANII (A)  Final      Susceptibility   Morganella morganii - MIC*    AMPICILLIN >=32 RESISTANT Resistant     CEFAZOLIN >=64 RESISTANT Resistant     CEFTRIAXONE <=1 SENSITIVE Sensitive     CIPROFLOXACIN <=0.25 SENSITIVE Sensitive     GENTAMICIN <=1 SENSITIVE Sensitive     IMIPENEM 4 SENSITIVE Sensitive     NITROFURANTOIN 128 RESISTANT Resistant     TRIMETH/SULFA <=20 SENSITIVE Sensitive     AMPICILLIN/SULBACTAM >=32 RESISTANT Resistant  PIP/TAZO <=4 SENSITIVE Sensitive     * >=100,000 COLONIES/mL MORGANELLA MORGANII  Culture, blood (x 2)     Status: None (Preliminary result)   Collection Time: 11/02/16  5:38 AM  Result Value Ref Range Status   Specimen Description BLOOD RIGHT ANTECUBITAL  Final   Special Requests IN PEDIATRIC BOTTLE 1CC  Final   Culture   Final    NO GROWTH 1 DAY Performed at [facility], [place] [address], [place], [place] [address]    Report Status PENDING  Incomplete  Culture, blood (x 2)     Status: None (Preliminary result)   Collection Time: 11/02/16  5:38 AM  Result Value Ref Range Status   Specimen Description BLOOD LEFT HAND  Final   Special Requests IN PEDIATRIC BOTTLE 2CC  Final   Culture   Final    NO GROWTH 1 DAY Performed at [facility], [place] [address], [place], [place] [address]    Report Status PENDING  Incomplete     Labs: Basic Metabolic Panel:  Recent Labs Lab 11/01/16 2225 11/02/16 0540 11/03/16 0350 11/04/16 0505  NA 137 142 141 138  K 3.0* 3.7 3.6 3.5  CL  105 110 109 108  CO2 28 26 25 24   GLUCOSE 143* 107* 100* 93  BUN 15 14 15 14   CREATININE 0.74 0.53 0.65 0.74  CALCIUM 8.8* 8.8* 8.6* 8.6*  MG  --  1.7  --   --   PHOS  --  2.7  --   --    Liver Function Tests:  Recent Labs Lab 11/01/16 2343 11/02/16 0540  AST 31 34  ALT 17 17  ALKPHOS 65 62  BILITOT 1.6* 1.8*  PROT 5.7* 5.5*  ALBUMIN 2.9* 2.9*   No results for input(s): LIPASE, AMYLASE in the last 168 hours.  Recent Labs Lab 11/01/16 2344  AMMONIA 66*   CBC:  Recent Labs Lab 11/01/16 2225 11/02/16 0540 11/03/16 0350 11/04/16 0505  WBC 2.8* 2.3* 2.2* 2.3*  HGB 10.6* 10.6* 10.2* 10.0*  HCT 32.0* 31.9* 30.8* 30.2*  MCV 86.7 87.2 87.5 87.8  PLT 46* 45* 47* 38*   Cardiac Enzymes:  Recent Labs Lab 11/02/16 0540 11/02/16 1212 11/02/16 1821  TROPONINI <0.03 <0.03 <0.03   BNP: BNP (last 3 results) No results for input(s): BNP in the last 8760 hours.  ProBNP (last 3 results) No results for input(s): PROBNP in the last 8760 hours.  CBG:  Recent Labs Lab 11/03/16 1151 11/03/16 1624 11/03/16 2100 11/04/16 0806 11/04/16 1138  GLUCAP 274* 108* 169* 91 153*       Signed:  Nuvia Robertson  [facility] 11/04/2016, 11:43 AM

## 2016-11-04 NOTE — Discharge Instructions (Signed)
Near-Syncope Introduction Near-syncope is when you suddenly get weak or dizzy, or you feel like you might pass out (faint). During an episode of near-syncope, you may:  Feel dizzy or light-headed.  Feel sick to your stomach (nauseous).  See all white or all black.  Have cold, clammy skin. If you passed out, get help right away.Call your local emergency services (911 in the [place] Do not drive yourself to the hospital. Follow these instructions at home: Pay attention to any changes in your symptoms. Take these actions to help with your condition:  Have someone stay with you until you feel stable.  Do not drive, use machinery, or play sports until your doctor says it is okay.  Keep all follow-up visits as told by your doctor. This is important.  If you start to feel like you might pass out, lie down right away and raise (elevate) your feet above the level of your heart. Breathe deeply and steadily. Wait until all of the symptoms are gone.  Drink enough fluid to keep your pee (urine) clear or pale yellow.  If you are taking blood pressure or heart medicine, get up slowly and spend many minutes getting ready to sit and then stand. This can help with dizziness.  Take over-the-counter and prescription medicines only as told by your doctor. Get help right away if:  You have a very bad headache.  You have unusual pain in your chest, tummy, or back.  You are bleeding from your mouth or rectum.  You have black or tarry poop (stool).  You have a very fast or uneven heartbeat (palpitations).  You pass out one time or more than once.  You have jerky movements that you cannot control (seizure).  You are confused.  You have trouble walking.  You are very weak.  You have vision problems. These symptoms may be an emergency. Do not wait to see if the symptoms will go away. Get medical help right away. Call your local emergency services (911 in the [place] Do not drive yourself to the  hospital.  This information is not intended to replace advice given to you by your health care provider. Make sure you discuss any questions you have with your health care provider. Document Released: 02/25/2008 Document Revised: 02/14/2016 Document Reviewed: 05/23/2015  2017 Elsevier

## 2016-11-04 NOTE — Care Management Note (Signed)
Case Management Note  Patient Details  Name: [name] MRN: [id number] Date of Birth: 1941-09-05  Subjective/Objective:   76 y/o f admitted w/Benign htn. From home.                 Action/Plan:d/c home.   Expected Discharge Date:  11/04/16               Expected Discharge Plan:  Home/Self Care  In-House Referral:     Discharge planning Services  CM Consult  Post Acute Care Choice:    Choice offered to:     DME Arranged:    DME Agency:     HH Arranged:    HH Agency:     Status of Service:  Completed, signed off  If discussed at [employer] of Stay Meetings, dates discussed:    Additional Comments:  Dessa Phi, RN 11/04/2016, 11:12 AM

## 2016-11-04 NOTE — Progress Notes (Signed)
The patient has been seen in conjunction with Reino Bellis, NP-C . All aspects of care have been considered and discussed. The patient has been personally interviewed, examined, and all clinical data has been reviewed.   Stable without significant bradycardia < 50 bpm.  No syncope.  No further evaluation at this time. Please call if we may be of further assistance.   Progress Note  Patient Name: [name] Date of Encounter: 11/04/2016  Primary Cardiologist: New, Dr Tamala Julian  Subjective   Didn't get much sleep last night, but feeling better this morning.   Inpatient Medications    Scheduled Meds: . cefTRIAXone (ROCEPHIN)  IV  1 g Intravenous Q24H  . citalopram  40 mg Oral Daily  . doxycycline  100 mg Oral Q12H  . guaiFENesin  600 mg Oral BID  . pantoprazole  40 mg Oral Daily  . sodium chloride flush  3 mL Intravenous Q12H   Continuous Infusions:  PRN Meds: acetaminophen **OR** acetaminophen, albuterol, guaiFENesin, ondansetron **OR** ondansetron (ZOFRAN) IV, polyethylene glycol   Vital Signs    Vitals:   11/03/16 0612 11/03/16 1325 11/03/16 2042 11/04/16 0555  BP: (!) 154/50 (!) 141/62 (!) 141/60 (!) 145/51  Pulse: (!) 57 (!) 59 60 (!) 56  Resp: 18 18 17 16   Temp: 97.9 F (36.6 C) 98.4 F (36.9 C) 98.3 F (36.8 C) 98 F (36.7 C)  TempSrc: Oral Oral Oral Oral  SpO2: 97% 95% 99% 97%  Weight:      Height:        Intake/Output Summary (Last 24 hours) at 11/04/16 0820 Last data filed at 11/04/16 0556  Gross per 24 hour  Intake                0 ml  Output              600 ml  Net             -600 ml   Filed Weights   11/02/16 0517  Weight: 241 lb (109.3 kg)    Telemetry    SR, PVCs - Personally Reviewed  ECG    n/a - Personally Reviewed  Physical Exam   General: Well developed, well nourished, female appearing in no acute distress. Head: Normocephalic, atraumatic.  Neck: Supple without bruits, JVD not elevated. Lungs:  Resp regular  and unlabored, CTA. Heart: RRR, S1, S2, no S3, S4, 2/6 murmur; no rub. Abdomen: Soft, non-tender, non-distended with normoactive bowel sounds. No hepatomegaly. No rebound/guarding. No obvious abdominal masses. Extremities: No clubbing, cyanosis, no significant edema. Distal pedal pulses are 2+ bilaterally. Neuro: Alert and oriented X 2. Moves all extremities spontaneously. Psych: Normal affect.  Labs    Chemistry  Recent Labs Lab 11/01/16 2343 11/02/16 0540 11/03/16 0350 11/04/16 0505  NA  --  142 141 138  K  --  3.7 3.6 3.5  CL  --  110 109 108  CO2  --  26 25 24   GLUCOSE  --  107* 100* 93  BUN  --  14 15 14   CREATININE  --  0.53 0.65 0.74  CALCIUM  --  8.8* 8.6* 8.6*  PROT 5.7* 5.5*  --   --   ALBUMIN 2.9* 2.9*  --   --   AST 31 34  --   --   ALT 17 17  --   --   ALKPHOS 65 62  --   --   BILITOT 1.6* 1.8*  --   --  GFRNONAA  --  >60 >60 >60  GFRAA  --  >60 >60 >60  ANIONGAP  --  6 7 6      Hematology  Recent Labs Lab 11/02/16 0540 11/03/16 0350 11/04/16 0505  WBC 2.3* 2.2* 2.3*  RBC 3.66* 3.52* 3.44*  HGB 10.6* 10.2* 10.0*  HCT 31.9* 30.8* 30.2*  MCV 87.2 87.5 87.8  MCH 29.0 29.0 29.1  MCHC 33.2 33.1 33.1  RDW 17.5* 17.8* 17.8*  PLT 45* 47* 38*    Cardiac Enzymes  Recent Labs Lab 11/02/16 0540 11/02/16 1212 11/02/16 1821  TROPONINI <0.03 <0.03 <0.03     Recent Labs Lab 11/02/16 0014  TROPIPOC 0.00    Radiology    No results found.  Cardiac Studies   ECHO: 11/02/2016 - Left ventricle: The cavity size was normal. Wall thickness was   normal. Systolic function was normal. The estimated ejection   fraction was in the range of 60% to 65%. Wall motion was normal;   there were no regional wall motion abnormalities. Features are   consistent with a pseudonormal left ventricular filling pattern,   with concomitant abnormal relaxation and increased filling   pressure (grade 2 diastolic dysfunction). - Mitral valve: Calcified annulus. - Left  atrium: The atrium was severely dilated. - Right atrium: The atrium was moderately dilated. Impressions: - Compared to the prior study, there has been no significant   interval change.  Patient Profile     76 y.o. female with a history of dementia, anemia, GERD, HTN, thrombocytopenia, cirrhosis, bladder CA. Admitted 02/11 with near-syncope.  Assessment & Plan    1. Syncope   Near syncope - intermittent RBBB on telemetry - HR 50s>> propranolol held - happened in the setting of AMS, diaphoresis, pallor, ?fever - po intake had been down at home prior to admission - RBBB is new from 2015 - EF normal on echo - No significant bradycardia or pauses noted on telemetry. Stable to discharge from cardiology standpoint.   Otherwise, per IM Active Problems:   Bladder cancer ([place])   Thrombocytopenia ([place])   Dementia   Leukopenia   Presence of urostomy ([place])   Benign essential HTN   Hypokalemia   Acute encephalopathy    Signed, Reino Bellis ,NP 8:20 AM 11/04/2016

## 2016-11-07 LAB — CULTURE, BLOOD (ROUTINE X 2)
CULTURE: NO GROWTH
Culture: NO GROWTH

## 2016-11-11 DIAGNOSIS — K7460 Unspecified cirrhosis of liver: Secondary | ICD-10-CM | POA: Diagnosis not present

## 2016-11-11 DIAGNOSIS — R932 Abnormal findings on diagnostic imaging of liver and biliary tract: Secondary | ICD-10-CM | POA: Diagnosis not present

## 2016-11-13 ENCOUNTER — Other Ambulatory Visit: Payer: Self-pay | Admitting: Family Medicine

## 2016-11-13 ENCOUNTER — Ambulatory Visit (INDEPENDENT_AMBULATORY_CARE_PROVIDER_SITE_OTHER): Payer: Medicare Other | Admitting: Family Medicine

## 2016-11-13 ENCOUNTER — Encounter: Payer: Self-pay | Admitting: Family Medicine

## 2016-11-13 VITALS — BP 122/64 | HR 78 | Temp 98.1°F | Resp 20 | Ht 65.0 in | Wt 244.0 lb

## 2016-11-13 DIAGNOSIS — N390 Urinary tract infection, site not specified: Secondary | ICD-10-CM | POA: Diagnosis not present

## 2016-11-13 DIAGNOSIS — R7309 Other abnormal glucose: Secondary | ICD-10-CM | POA: Diagnosis not present

## 2016-11-13 DIAGNOSIS — R55 Syncope and collapse: Secondary | ICD-10-CM | POA: Diagnosis not present

## 2016-11-13 DIAGNOSIS — Z09 Encounter for follow-up examination after completed treatment for conditions other than malignant neoplasm: Secondary | ICD-10-CM

## 2016-11-13 DIAGNOSIS — R001 Bradycardia, unspecified: Secondary | ICD-10-CM

## 2016-11-13 LAB — COMPLETE METABOLIC PANEL WITH GFR
ALBUMIN: 3 g/dL — AB (ref 3.6–5.1)
ALK PHOS: 70 U/L (ref 33–130)
ALT: 15 U/L (ref 6–29)
AST: 27 U/L (ref 10–35)
BILIRUBIN TOTAL: 1.6 mg/dL — AB (ref 0.2–1.2)
BUN: 12 mg/dL (ref 7–25)
CO2: 29 mmol/L (ref 20–31)
Calcium: 9 mg/dL (ref 8.6–10.4)
Chloride: 105 mmol/L (ref 98–110)
Creat: 0.68 mg/dL (ref 0.60–0.93)
GFR, EST NON AFRICAN AMERICAN: 85 mL/min (ref >=60)
Glucose, Bld: 173 mg/dL — ABNORMAL HIGH (ref 70–99)
POTASSIUM: 3.5 mmol/L (ref 3.5–5.3)
Sodium: 142 mmol/L (ref 135–146)
TOTAL PROTEIN: 5.7 g/dL — AB (ref 6.1–8.1)

## 2016-11-13 LAB — URINALYSIS, MICROSCOPIC ONLY
Casts: NONE SEEN [LPF]
Crystals: NONE SEEN [HPF]
Yeast: NONE SEEN [HPF]

## 2016-11-13 LAB — CBC WITH DIFFERENTIAL/PLATELET
BASOS ABS: 31 {cells}/uL (ref 0–200)
Basophils Relative: 1 %
EOS ABS: 62 {cells}/uL (ref 15–500)
Eosinophils Relative: 2 %
HCT: 34.6 % — ABNORMAL LOW (ref 35.0–45.0)
HEMOGLOBIN: 11 g/dL — AB (ref 12.0–15.0)
Lymphocytes Relative: 20 %
Lymphs Abs: 620 cells/uL — ABNORMAL LOW (ref 850–3900)
MCH: 28.4 pg (ref 27.0–33.0)
MCHC: 31.8 g/dL — ABNORMAL LOW (ref 32.0–36.0)
MCV: 89.4 fL (ref 80.0–100.0)
MONOS PCT: 11 %
Monocytes Absolute: 341 cells/uL (ref 200–950)
NEUTROS ABS: 2046 {cells}/uL (ref 1500–7800)
Neutrophils Relative %: 66 %
PLATELETS: 77 10*3/uL — AB (ref 140–400)
RBC: 3.87 MIL/uL (ref 3.80–5.10)
RDW: 18.6 % — ABNORMAL HIGH (ref 11.0–15.0)
WBC: 3.1 10*3/uL — ABNORMAL LOW (ref 3.8–10.8)

## 2016-11-13 LAB — URINALYSIS, ROUTINE W REFLEX MICROSCOPIC
Bilirubin Urine: NEGATIVE
Glucose, UA: NEGATIVE
Ketones, ur: NEGATIVE
NITRITE: NEGATIVE
Specific Gravity, Urine: 1.015 (ref 1.001–1.035)
pH: 7 (ref 5.0–8.0)

## 2016-11-13 NOTE — Progress Notes (Signed)
Subjective:    Patient ID: [name], female    DOB: Oct 09, 1940, 76 y.o.   MRN: [id number]  HPI Suddenly admitted to the hospital with syncope presumed to be secondary to bradycardia. I have reviewed hospital records. Propranolol, which the patient was taking for esophageal varices, was discontinued. Since that time her heart rate has remained in the 60s and 70s. Both she and her husband deny any further syncope or near syncopal episodes. Her heart rate today is regular. She does have occasional PVCs that I can auscultate. She denies any chest pain or shortness of breath. She denies any melena or blood in her stool. She does have sores in her mouth which I will treat with Magic mouthwash. She also was diagnosed with urinary tract infection while in the hospital. She is unable to provide any symptoms of urinary tract infection due to the fact she has an ostomy bag. However I would like to recheck her urine sample to ensure resolution. Past Medical History:  Diagnosis Date  . Anemia, iron deficiency   . Arthritis   . Bladder cancer [facility]) 2003   Bladder removed - [name]  . Cataract immature   . Cirrhosis ([place]) 08/26/2011  . Colon cancer [facility]) 2001   Hemicolectomy - [name]  . Depression   . GERD (gastroesophageal reflux disease)   . Headache(784.0)   . Hemorrhoids   . History of colon polyps   . History of DVT (deep vein thrombosis) 50+yrs ago   With pregnancy  . History of GI bleed   . History of kidney stones   . History of urostomy    Urostomy d/t hx bladder cancer  . Hx of transfusion of whole blood   . Hypertension   . Joint pain   . Memory loss    Chemotherapy; short and long term  . Osteoporosis   . Thrombocytopenia ([place])    Past Surgical History:  Procedure Laterality Date  . ABDOMINAL HYSTERECTOMY    . APPENDECTOMY    . BLADDER SURGERY     bladder removed d/t cancer  . CHOLECYSTECTOMY    . COLON RESECTION     with colostomy then colostomy reversal  .  COLON SURGERY  2004  . COLON SURGERY  2001  . COLONOSCOPY    . ESOPHAGOGASTRODUODENOSCOPY  02/27/2012   Procedure: ESOPHAGOGASTRODUODENOSCOPY (EGD);  Surgeon: [name], MD;  Location: [name];  Service: Endoscopy;  Laterality: N/A;  bedside case  . NEPHROLITHOTOMY Left 05/04/2014   Procedure: LEFT PERCUTANEOUS NEPHROLITHOTOMY ;  Surgeon: [name], MD;  Location: [facility];  Service: Urology;  Laterality: Left;  . TOTAL KNEE ARTHROPLASTY  2010   Left knee   Current Outpatient Prescriptions on File Prior to Visit  Medication Sig Dispense Refill  . acidophilus (RISAQUAD) CAPS capsule Take 1 capsule by mouth daily.    . Ascorbic Acid (VITAMIN C) 1000 MG tablet Take 1,000 mg by mouth daily.    . cefpodoxime (VANTIN) 200 MG tablet Take 1 tablet (200 mg total) by mouth every 12 (twelve) hours. 10 tablet 0  . Cholecalciferol (VITAMIN D3) 1000 UNITS CAPS Take 1,000 Units by mouth daily.     . Cinnamon 500 MG capsule Take 500 mg by mouth daily.     . citalopram (CELEXA) 20 MG tablet Take 1 tablet (20 mg total) by mouth daily. 30 tablet 0  . Cyanocobalamin (VITAMIN B 12 PO) Take 1 tablet by mouth every morning.     Marland Kitchen  fexofenadine (ALLEGRA) 180 MG tablet Take 180 mg by mouth daily.    . fish oil-omega-3 fatty acids 1000 MG capsule Take 1,200 mg by mouth daily.     . Ginkgo Biloba 60 MG TABS Take 60 mg by mouth every morning.     Marland Kitchen guaiFENesin (MUCINEX) 600 MG 12 hr tablet Take 600 mg by mouth 2 (two) times daily as needed for cough or to loosen phlegm.    Marland Kitchen losartan (COZAAR) 25 MG tablet TAKE 1 TABLET BY MOUTH EVERY DAY, STOP RAMIPRIL (Patient taking differently: TAKE 25 MG BY MOUTH EACH DAY) 30 tablet 11  . Multiple Vitamin (MULTIVITAMIN WITH MINERALS) TABS tablet Take 1 tablet by mouth daily.    . pantoprazole (PROTONIX) 40 MG tablet Take 1 tablet (40 mg total) by mouth daily. 30 tablet 11  . triamcinolone cream (KENALOG) 0.1 % Apply 1 application topically 2 (two) times daily. 30 g 0  .  vitamin E 100 UNIT capsule Take 400 Units by mouth daily.      No current facility-administered medications on file prior to visit.    Allergies  Allergen Reactions  . Codeine Swelling and Rash    Swelling in throat  . Morphine And Related Swelling and Rash  . Other Anaphylaxis    Bee Stings  . Phytonadione Other (See Comments)    Patient experienced episode of cyanosis following dose of Vitamin K 10 mg IV.  . Meat Extract     Patient doesn't eat meat   . Promethazine Hcl     Per MD chart - 08/08/11  . Penicillins Rash    Has patient had a PCN reaction causing immediate rash, facial/tongue/throat swelling, SOB or lightheadedness with hypotension: YES Has patient had a PCN reaction causing severe rash involving mucus membranes or skin necrosis: NO Has patient had a PCN reaction that required hospitalization: NO Has patient had a PCN reaction occurring within the last 10 years: NO If all of the above answers are "NO", then may proceed with Cephalosporin use.    Social History   Social History  . Marital status: Married    Spouse name: N/A  . Number of children: N/A  . Years of education: N/A   Occupational History  . Not on file.   Social History Main Topics  . Smoking status: Former Smoker    Packs/day: 0.50    Years: 35.00    Types: Cigarettes    Start date: 10/25/1955    Quit date: 09/22/1991  . Smokeless tobacco: Never Used     Comment: quit 23years ago  . Alcohol use No  . Drug use: No  . Sexual activity: Yes    Birth control/ protection: Surgical   Other Topics Concern  . Not on file   Social History Narrative   Lives at home with her husband, ambulatory      Review of Systems  All other systems reviewed and are negative.      Objective:   Physical Exam  Constitutional: She appears well-developed and well-nourished. No distress.  Neck: Neck supple. No JVD present.  Cardiovascular: Normal rate, regular rhythm and normal heart sounds.     Pulmonary/Chest: Effort normal and breath sounds normal. No respiratory distress. She has no wheezes. She has no rales.  Abdominal: Soft. Bowel sounds are normal. She exhibits no distension. There is no tenderness. There is no rebound and no guarding.  Musculoskeletal: She exhibits no edema.  Skin: She is not diaphoretic.  Vitals reviewed.  Assessment & Plan:  Hospital discharge follow-up - Plan: CBC with Differential/Platelet, COMPLETE METABOLIC PANEL WITH GFR, Urinalysis, Routine w reflex microscopic, Urine culture  Syncope and collapse  Bradycardia  Urinary tract infection without hematuria, site unspecified   Patient appears to have had a syncopal episode secondary bradycardia. Since discontinuation of propranolol she is done very well. She has follow-up planned with the cardiologist on March 6. At the present time there seems to be no indication for pacemaker. Regarding her urinary tract infection, I will repeat a urinalysis and urine culture to ensure resolution.

## 2016-11-14 LAB — HEMOGLOBIN A1C
Hgb A1c MFr Bld: 4.9 % (ref <5.7)
Mean Plasma Glucose: 94 mg/dL

## 2016-11-17 LAB — URINE CULTURE

## 2016-11-19 ENCOUNTER — Other Ambulatory Visit: Payer: Self-pay | Admitting: Family Medicine

## 2016-11-19 MED ORDER — CIPROFLOXACIN HCL 250 MG PO TABS: 250.0000 mg | ORAL_TABLET | Freq: Two times a day (BID) | ORAL | 0 refills | Status: DC

## 2016-11-24 NOTE — Progress Notes (Signed)
CARDIOLOGY OFFICE NOTE  Date:  11/25/2016    [name] Date of Birth: [birth year] Medical Record #[id number]  PCP:  Odette Fraction, MD  Cardiologist:  Jennings Books    Chief Complaint  Patient presents with  . Follow-up    Post hospital visit - seen for Dr. Tamala Julian    History of Present Illness: [name] is a 76 y.o. female who presents today for a post hospital visit. Seen for Dr. Tamala Julian.   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   Presented to [facility] last month with near syncope. Had been sitting in a chair and suddenly became pale and diaphoretic. ? Of fever. Some reports of HR of 48. EMS was called.  She had had the flu. She was not able to provide any history herself. Family had noted some increase in her confusion.  She was subsequently admitted - treated for UTI. Cardiology consulted. No arrhythmia noted. Echo stable. Carotid dopplers with 1 to 39% bilateral disease. No further evaluation recommended from our standpoint. Dose of Celexa cut back due to QT interaction. Her Inderal was stopped.   Comes in today. Here with family. She is doing better. No more spells of passing out. She denies chest pain. Breathing is ok. Has been put back on some antibiotics for her urine since discharge. Husband feels like she is doing well. Not dizzy or lightheaded.    Past Medical History:  Diagnosis Date  . Anemia, iron deficiency   . Arthritis   . Bladder cancer [facility]) 2003   Bladder removed - [name]  . Cataract immature   . Cirrhosis ([place]) 08/26/2011  . Colon cancer [facility]) 2001   Hemicolectomy - [name]  . Depression   . GERD (gastroesophageal reflux disease)   . Headache(784.0)   . Hemorrhoids   . History of colon polyps     . History of DVT (deep vein thrombosis) 50+yrs ago   With pregnancy  . History of GI bleed   . History of kidney stones   . History of urostomy    Urostomy d/t hx bladder cancer  . Hx of transfusion of whole blood   . Hypertension   . Joint pain   . Memory loss    Chemotherapy; short and long term  . Osteoporosis   . Thrombocytopenia ([place])     Past Surgical History:  Procedure Laterality Date  . ABDOMINAL HYSTERECTOMY    . APPENDECTOMY    . BLADDER SURGERY     bladder removed d/t cancer  . CHOLECYSTECTOMY    . COLON RESECTION     with colostomy then colostomy reversal  . COLON SURGERY  2004  . COLON SURGERY  2001  . COLONOSCOPY    . ESOPHAGOGASTRODUODENOSCOPY  02/27/2012   Procedure: ESOPHAGOGASTRODUODENOSCOPY (EGD);  Surgeon: [name], MD;  Location: [name];  Service: Endoscopy;  Laterality: N/A;  bedside case  . NEPHROLITHOTOMY Left 05/04/2014   Procedure: LEFT PERCUTANEOUS NEPHROLITHOTOMY ;  Surgeon: [name], MD;  Location: [facility];  Service: Urology;  Laterality: Left;  . TOTAL KNEE ARTHROPLASTY  2010   Left knee     Medications: Current Outpatient Prescriptions  Medication Sig Dispense Refill  . acidophilus (RISAQUAD) CAPS capsule Take 1 capsule by mouth daily.    Marland Kitchen  Apoaequorin (PREVAGEN PO) Take 1 tablet by mouth daily.    . Ascorbic Acid (VITAMIN C) 1000 MG tablet Take 1,000 mg by mouth daily.    . cefpodoxime (VANTIN) 200 MG tablet Take 1 tablet (200 mg total) by mouth every 12 (twelve) hours. 10 tablet 0  . Cholecalciferol (VITAMIN D3) 1000 UNITS CAPS Take 1,000 Units by mouth daily.     . Cinnamon 500 MG capsule Take 500 mg by mouth daily.     . ciprofloxacin (CIPRO) 250 MG tablet Take 1 tablet (250 mg total) by mouth 2 (two) times daily. 14 tablet 0  . citalopram (CELEXA) 20 MG tablet Take 1 tablet (20 mg total) by mouth daily. 30 tablet 0  . Cyanocobalamin (VITAMIN B 12 PO) Take 1 tablet by mouth every morning.     . fexofenadine (ALLEGRA) 180  MG tablet Take 180 mg by mouth daily.    . fish oil-omega-3 fatty acids 1000 MG capsule Take 1,200 mg by mouth daily.     . Ginkgo Biloba 60 MG TABS Take 60 mg by mouth every morning.     Marland Kitchen guaiFENesin (MUCINEX) 600 MG 12 hr tablet Take 600 mg by mouth 2 (two) times daily as needed for cough or to loosen phlegm.    Marland Kitchen losartan (COZAAR) 25 MG tablet TAKE 1 TABLET BY MOUTH EVERY DAY, STOP RAMIPRIL (Patient taking differently: TAKE 25 MG BY MOUTH EACH DAY) 30 tablet 11  . Multiple Vitamin (MULTIVITAMIN WITH MINERALS) TABS tablet Take 1 tablet by mouth daily.    . pantoprazole (PROTONIX) 40 MG tablet Take 1 tablet (40 mg total) by mouth daily. 30 tablet 11  . triamcinolone cream (KENALOG) 0.1 % Apply 1 application topically 2 (two) times daily. 30 g 0  . vitamin E 100 UNIT capsule Take 400 Units by mouth daily.      No current facility-administered medications for this visit.     Allergies: Allergies  Allergen Reactions  . Codeine Swelling and Rash    Swelling in throat  . Morphine And Related Swelling and Rash  . Other Anaphylaxis    Bee Stings  . Phytonadione Other (See Comments)    Patient experienced episode of cyanosis following dose of Vitamin K 10 mg IV.  . Meat Extract     Patient doesn't eat meat   . Promethazine Hcl     Per MD chart - 08/08/11  . Penicillins Rash    Has patient had a PCN reaction causing immediate rash, facial/tongue/throat swelling, SOB or lightheadedness with hypotension: YES Has patient had a PCN reaction causing severe rash involving mucus membranes or skin necrosis: NO Has patient had a PCN reaction that required hospitalization: NO Has patient had a PCN reaction occurring within the last 10 years: NO If all of the above answers are "NO", then may proceed with Cephalosporin use.     Social History: The patient  reports that she quit smoking about 25 years ago. Her smoking use included Cigarettes. She started smoking about 61 years ago. She has a 17.50  pack-year smoking history. She has never used smokeless tobacco. She reports that she does not drink alcohol or use drugs.   Family History: The patient's family history includes Cancer in her other; Diabetes in her brother; Hypertension in her other.   Review of Systems: Please see the history of present illness.   Otherwise, the review of systems is positive for none.   All other systems are reviewed and negative.  Physical Exam: VS:  BP 126/70   Pulse 60   Ht 5' 6"  (1.676 m)   Wt 241 lb (109.3 kg)   BMI 38.90 kg/m  .  BMI Body mass index is 38.9 kg/m.  Wt Readings from Last 3 Encounters:  11/25/16 241 lb (109.3 kg)  11/13/16 244 lb (110.7 kg)  11/02/16 241 lb (109.3 kg)    General: Elderly female - she is in no acute distress.   HEENT: Normal.  Neck: Supple, no JVD, carotid bruits, or masses noted.  Cardiac: Regular rate and rhythm. No murmurs, rubs, or gallops. No edema.  Respiratory:  Lungs are clear to auscultation bilaterally with normal work of breathing.  GI: Soft and nontender.  MS: No deformity or atrophy. Gait and ROM intact.  Skin: Warm and dry. Color is normal.  Neuro:  Strength and sensation are intact and no gross focal deficits noted.  Psych: Alert, appropriate and with normal affect.   LABORATORY DATA:  EKG:  EKG is not ordered today. .  Lab Results  Component Value Date   WBC 3.1 (L) 11/13/2016   HGB 11.0 (L) 11/13/2016   HCT 34.6 (L) 11/13/2016   PLT 77 (L) 11/13/2016   GLUCOSE 173 (H) 11/13/2016   CHOL 138 06/19/2015   TRIG 67 06/19/2015   HDL 47 06/19/2015   LDLCALC 78 06/19/2015   ALT 15 11/13/2016   AST 27 11/13/2016   NA 142 11/13/2016   K 3.5 11/13/2016   CL 105 11/13/2016   CREATININE 0.68 11/13/2016   BUN 12 11/13/2016   CO2 29 11/13/2016   TSH 0.659 11/02/2016   INR 1.54 (H) 04/08/2014   HGBA1C 4.9 11/13/2016    BNP (last 3 results) No results for input(s): BNP in the last 8760 hours.  ProBNP (last 3 results) No results  for input(s): PROBNP in the last 8760 hours.   Other Studies Reviewed Today:  ECHO: 11/02/2016 - Left ventricle: The cavity size was normal. Wall thickness was normal. Systolic function was normal. The estimated ejection fraction was in the range of 60% to 65%. Wall motion was normal; there were no regional wall motion abnormalities. Features are consistent with a pseudonormal left ventricular filling pattern, with concomitant abnormal relaxation and increased filling pressure (grade 2 diastolic dysfunction). - Mitral valve: Calcified annulus. - Left atrium: The atrium was severely dilated. - Right atrium: The atrium was moderately dilated. Impressions: - Compared to the prior study, there has been no significant interval change  Assessment/Plan: 1. Syncope - no significant arrhthymias while on telemetry. Echo was stable. New RBBB. This event occurred in the setting of mental status changes/fever/UTI. Cardiology did not feel that further evaluation warranted. See back prn.   2. Multiple medical issues - as noted above.   Her cardiac status at this time is stable. Will be available as needed.     Current medicines are reviewed with the patient today.  The patient does not have concerns regarding medicines other than what has been noted above.  The following changes have been made:  See above.  Labs/ tests ordered today include:   No orders of the defined types were placed in this encounter.    Disposition:   FU prn.   Patient is agreeable to this plan and will call if any problems develop in the interim.   SignedTruitt Merle, NP  11/25/2016 2:53 PM  [facility] [address] [place], [place]  [address] Phone: [phone])  [phone] Fax: [phone]

## 2016-11-25 ENCOUNTER — Ambulatory Visit (INDEPENDENT_AMBULATORY_CARE_PROVIDER_SITE_OTHER): Payer: Medicare Other | Admitting: Nurse Practitioner

## 2016-11-25 ENCOUNTER — Encounter (INDEPENDENT_AMBULATORY_CARE_PROVIDER_SITE_OTHER): Payer: Self-pay

## 2016-11-25 ENCOUNTER — Encounter: Payer: Self-pay | Admitting: Nurse Practitioner

## 2016-11-25 VITALS — BP 126/70 | HR 60 | Ht 66.0 in | Wt 241.0 lb

## 2016-11-25 DIAGNOSIS — R55 Syncope and collapse: Secondary | ICD-10-CM | POA: Diagnosis not present

## 2016-11-25 NOTE — Patient Instructions (Signed)
We will be checking the following labs today - NONE   Medication Instructions:    Continue with your current medicines.     Testing/Procedures To Be Arranged:  N/A  Follow-Up:   See [place] back as needed.     Other Special Instructions:   N/A    If you need a refill on your cardiac medications before your next appointment, please call your pharmacy.   Call the [place] office at [phone] if you have any questions, problems or concerns.

## 2017-01-06 ENCOUNTER — Ambulatory Visit (HOSPITAL_BASED_OUTPATIENT_CLINIC_OR_DEPARTMENT_OTHER): Payer: Medicare Other | Admitting: Family

## 2017-01-06 ENCOUNTER — Other Ambulatory Visit (HOSPITAL_BASED_OUTPATIENT_CLINIC_OR_DEPARTMENT_OTHER): Payer: Medicare Other

## 2017-01-06 VITALS — BP 133/60 | HR 60 | Temp 97.2°F | Resp 19 | Wt 239.4 lb

## 2017-01-06 DIAGNOSIS — D72818 Other decreased white blood cell count: Secondary | ICD-10-CM

## 2017-01-06 DIAGNOSIS — D696 Thrombocytopenia, unspecified: Secondary | ICD-10-CM | POA: Diagnosis not present

## 2017-01-06 DIAGNOSIS — D509 Iron deficiency anemia, unspecified: Secondary | ICD-10-CM

## 2017-01-06 DIAGNOSIS — F0390 Unspecified dementia without behavioral disturbance: Secondary | ICD-10-CM | POA: Diagnosis not present

## 2017-01-06 DIAGNOSIS — K7460 Unspecified cirrhosis of liver: Secondary | ICD-10-CM | POA: Diagnosis not present

## 2017-01-06 DIAGNOSIS — C679 Malignant neoplasm of bladder, unspecified: Secondary | ICD-10-CM

## 2017-01-06 DIAGNOSIS — C189 Malignant neoplasm of colon, unspecified: Secondary | ICD-10-CM

## 2017-01-06 LAB — CBC WITH DIFFERENTIAL (CANCER CENTER ONLY)
BASO#: 0 10*3/uL (ref 0.0–0.2)
BASO%: 0.6 % (ref 0.0–2.0)
EOS ABS: 0.1 10*3/uL (ref 0.0–0.5)
EOS%: 2.2 % (ref 0.0–7.0)
HCT: 34.3 % — ABNORMAL LOW (ref 34.8–46.6)
HGB: 11.2 g/dL — ABNORMAL LOW (ref 11.6–15.9)
LYMPH#: 0.7 10*3/uL — ABNORMAL LOW (ref 0.9–3.3)
LYMPH%: 22 % (ref 14.0–48.0)
MCH: 28.4 pg (ref 26.0–34.0)
MCHC: 32.7 g/dL (ref 32.0–36.0)
MCV: 87 fL (ref 81–101)
MONO#: 0.4 10*3/uL (ref 0.1–0.9)
MONO%: 13.8 % — AB (ref 0.0–13.0)
NEUT#: 2 10*3/uL (ref 1.5–6.5)
NEUT%: 61.4 % (ref 39.6–80.0)
Platelets: 67 10*3/uL — ABNORMAL LOW (ref 145–400)
RBC: 3.94 10*6/uL (ref 3.70–5.32)
RDW: 17.3 % — ABNORMAL HIGH (ref 11.1–15.7)
WBC: 3.2 10*3/uL — AB (ref 3.9–10.0)

## 2017-01-06 LAB — CHCC SATELLITE - SMEAR

## 2017-01-06 LAB — TECHNOLOGIST REVIEW CHCC SATELLITE

## 2017-01-06 NOTE — Progress Notes (Signed)
Hematology and Oncology Follow Up Visit  [name] [id number] 01/14/1941 76 y.o. 01/06/2017   Principle Diagnosis:  1. Recurrent iron deficiency anemia. 2. 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 3. Remote history of stage II (T3, N0, M0) adenocarcinoma of colon. 4. 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 She is pleasantly confused due to dementia which is unchanged from her baseline. Some information was collected from her husband.  He states that she is fatigued at times and naps some during the day.  No fever, chills, n/v, cough, rash, dizziness, chest pain, palpitations or changes in her bowel or bladder habits.  She had some slight tenderness in her left upper quadrant on palpation. She had splenomegaly (1,000 cc volume) present on her abdominal MRI in December with Dr. [name]. Her husband states that they will follow-up with their office around June.  She has occasional episodes of SOB with over exertion and will take time to rest as needed.  No episodes of bleeding and no petechiae or excessive bruising.  No swelling, tenderness, numbness or tingling in her extremities.  She has maintained a good appetite and is staying hydrated. Her weight is stable.   ECOG Performance Status: 1 - Symptomatic but completely ambulatory   Medications:  Allergies as of 01/06/2017      Reactions   Codeine Swelling, Rash   Swelling in throat   Morphine And Related Swelling, Rash   Other Anaphylaxis   Bee Stings   Phytonadione Other (See Comments)   Patient experienced episode of cyanosis following dose of Vitamin K 10 mg IV.   Meat Extract    Patient doesn't eat meat    Promethazine Hcl    Per MD chart - 08/08/11   Penicillins Rash   Has patient had a PCN reaction  causing immediate rash, facial/tongue/throat swelling, SOB or lightheadedness with hypotension: YES Has patient had a PCN reaction causing severe rash involving mucus membranes or skin necrosis: NO Has patient had a PCN reaction that required hospitalization: NO Has patient had a PCN reaction occurring within the last 10 years: NO If all of the above answers are "NO", then may proceed with Cephalosporin use.      Medication List       Accurate as of 01/06/17 12:36 PM. Always use your most recent med list.          acidophilus Caps capsule Take 1 capsule by mouth daily.   cefpodoxime 200 MG tablet Commonly known as:  VANTIN Take 1 tablet (200 mg total) by mouth every 12 (twelve) hours.   Cinnamon 500 MG capsule Take 500 mg by mouth daily.   ciprofloxacin 250 MG tablet Commonly known as:  CIPRO Take 1 tablet (250 mg total) by mouth 2 (two) times daily.   citalopram 20 MG tablet Commonly known as:  CELEXA Take 1 tablet (20 mg total) by mouth daily.   fexofenadine 180 MG tablet Commonly known as:  ALLEGRA Take 180 mg by mouth daily.   fish oil-omega-3 fatty acids 1000 MG capsule Take 1,200 mg by mouth daily.   Ginkgo Biloba 60 MG Tabs Take 60 mg by mouth every morning.   guaiFENesin 600 MG 12 hr tablet Commonly known as:  MUCINEX Take 600 mg by mouth 2 (two) times daily as needed for  cough or to loosen phlegm.   INDERAL PO Take 10 mg by mouth.   losartan 25 MG tablet Commonly known as:  COZAAR TAKE 1 TABLET BY MOUTH EVERY DAY, STOP RAMIPRIL   multivitamin with minerals Tabs tablet Take 1 tablet by mouth daily.   pantoprazole 40 MG tablet Commonly known as:  PROTONIX Take 1 tablet (40 mg total) by mouth daily.   predniSONE 20 MG tablet Commonly known as:  DELTASONE Take 20 mg by mouth.   PREVAGEN PO Take 1 tablet by mouth daily.   triamcinolone cream 0.1 % Commonly known as:  KENALOG Apply 1 application topically 2 (two) times daily.   VITAMIN B 12  PO Take 1 tablet by mouth every morning.   vitamin C 1000 MG tablet Take 1,000 mg by mouth daily.   Vitamin D3 1000 units Caps Take 1,000 Units by mouth daily.   vitamin E 100 UNIT capsule Take 400 Units by mouth daily.       Allergies:  Allergies  Allergen Reactions  . Codeine Swelling and Rash    Swelling in throat  . Morphine And Related Swelling and Rash  . Other Anaphylaxis    Bee Stings  . Phytonadione Other (See Comments)    Patient experienced episode of cyanosis following dose of Vitamin K 10 mg IV.  . Meat Extract     Patient doesn't eat meat   . Promethazine Hcl     Per MD chart - 08/08/11  . Penicillins Rash    Has patient had a PCN reaction causing immediate rash, facial/tongue/throat swelling, SOB or lightheadedness with hypotension: YES Has patient had a PCN reaction causing severe rash involving mucus membranes or skin necrosis: NO Has patient had a PCN reaction that required hospitalization: NO Has patient had a PCN reaction occurring within the last 10 years: NO If all of the above answers are "NO", then may proceed with Cephalosporin use.     Past Medical History, Surgical history, Social history, and Family History were reviewed and updated.  Review of Systems: All other 10 point review of systems is negative.   Physical Exam:  weight is 239 lb 6.4 oz (108.6 kg). Her oral temperature is 97.2 F (36.2 C). Her blood pressure is 133/60 and her pulse is 60. Her respiration is 19 and oxygen saturation is 98%.   Wt Readings from Last 3 Encounters:  01/06/17 239 lb 6.4 oz (108.6 kg)  11/25/16 241 lb (109.3 kg)  11/13/16 244 lb (110.7 kg)    Ocular: Sclerae unicteric, pupils equal, round and reactive to light Ear-nose-throat: Oropharynx clear, dentition fair Lymphatic: No cervical, supraclavicular or axillary adenopathy Lungs no rales or rhonchi, good excursion bilaterally Heart regular rate and rhythm, no murmur appreciated Abd soft, slightly  tender in left upper quadrant, no guarding on exam, positive bowel sounds, no liver or spleen tip palpated, no fluid wave MSK no focal spinal tenderness, no joint edema Neuro: non-focal, well-oriented, appropriate affect Breasts: Deferred  Lab Results  Component Value Date   WBC 3.2 (L) 01/06/2017   HGB 11.2 (L) 01/06/2017   HCT 34.3 (L) 01/06/2017   MCV 87 01/06/2017   PLT 67 (L) 01/06/2017   Lab Results  Component Value Date   FERRITIN 13 09/09/2016   IRON 78 09/09/2016   TIBC 316 09/09/2016   UIBC 238 09/09/2016   IRONPCTSAT 25 09/09/2016   Lab Results  Component Value Date   RETICCTPCT 2.0 11/23/2014   RBC 3.94 01/06/2017  RETICCTABS 74.4 11/23/2014   No results found for: KPAFRELGTCHN, LAMBDASER, KAPLAMBRATIO No results found for: Kandis Cocking, IGMSERUM No results found for: Kathrynn Ducking, MSPIKE, SPEI   Chemistry      Component Value Date/Time   NA 142 11/13/2016 1056   NA 143 07/01/2010 0949   K 3.5 11/13/2016 1056   K 4.2 07/01/2010 0949   CL 105 11/13/2016 1056   CL 99 07/01/2010 0949   CO2 29 11/13/2016 1056   CO2 33 07/01/2010 0949   BUN 12 11/13/2016 1056   BUN 13 07/01/2010 0949   CREATININE 0.68 11/13/2016 1056      Component Value Date/Time   CALCIUM 9.0 11/13/2016 1056   CALCIUM 9.4 07/01/2010 0949   ALKPHOS 70 11/13/2016 1056   ALKPHOS 87 (H) 07/01/2010 0949   AST 27 11/13/2016 1056   AST 24 07/01/2010 0949   ALT 15 11/13/2016 1056   ALT 19 07/01/2010 0949   BILITOT 1.6 (H) 11/13/2016 1056   BILITOT 1.60 07/01/2010 0949      Impression and Plan: Ms. Gagliano is 76 yo white female with iron deficiency anemia and chronic leukopenia/thrombocytopenia secondary to cirrhosis. Hgb is stable at 11.2 with an MCV of 87. Unfortunately, we were unable to get enough blood from her for iron studies. Hgb is stable at 11.2 with an MCV of 87.  I spoke with Dr. [name] and we will hold off on give her iron at  this time.  I spent 15 minutes face to face counseling with the patient and her husband.  We will go ahead and plan to see her back in 4 months for labs and follow-up.  Her husband will contact our office with any questions or concerns. We can certainly see her sooner if need be.   Eliezer Bottom, NP 4/17/201812:36 PM

## 2017-01-07 ENCOUNTER — Telehealth: Payer: Self-pay | Admitting: *Deleted

## 2017-01-07 NOTE — Telephone Encounter (Addendum)
Patients husband aware of results  ----- Message from Eliezer Bottom, NP sent at 01/07/2017  9:56 AM EDT ----- Regarding: Iron  No iron infusion needed at this time. Please follow-up with Dr. [name] if occasional left sided discomfort persists/worsens. Thank you!!!  [name]  ----- Message ----- From: Interface, Lab In Three Zero One Sent: 01/06/2017  12:16 PM To: Eliezer Bottom, NP

## 2017-03-05 ENCOUNTER — Other Ambulatory Visit: Payer: Self-pay | Admitting: Family Medicine

## 2017-03-05 DIAGNOSIS — I10 Essential (primary) hypertension: Secondary | ICD-10-CM

## 2017-03-13 ENCOUNTER — Telehealth: Payer: Self-pay | Admitting: *Deleted

## 2017-03-13 ENCOUNTER — Ambulatory Visit (INDEPENDENT_AMBULATORY_CARE_PROVIDER_SITE_OTHER): Payer: Medicare Other | Admitting: Family Medicine

## 2017-03-13 ENCOUNTER — Ambulatory Visit
Admission: RE | Admit: 2017-03-13 | Discharge: 2017-03-13 | Disposition: A | Discharge status: Home / Self Care | Payer: Medicare Other | Source: Ambulatory Visit | Attending: Family Medicine | Admitting: Family Medicine

## 2017-03-13 VITALS — BP 110/60 | HR 80 | Temp 98.2°F | Resp 18 | Ht 65.0 in | Wt 243.0 lb

## 2017-03-13 DIAGNOSIS — R509 Fever, unspecified: Secondary | ICD-10-CM

## 2017-03-13 DIAGNOSIS — R05 Cough: Secondary | ICD-10-CM | POA: Diagnosis not present

## 2017-03-13 LAB — CBC WITH DIFFERENTIAL/PLATELET
BASOS ABS: 0 {cells}/uL (ref 0–200)
Basophils Relative: 0 %
EOS ABS: 0 {cells}/uL — AB (ref 15–500)
Eosinophils Relative: 0 %
HCT: 31.4 % — ABNORMAL LOW (ref 35.0–45.0)
Hemoglobin: 10.1 g/dL — ABNORMAL LOW (ref 12.0–15.0)
LYMPHS PCT: 13 %
Lymphs Abs: 507 cells/uL — ABNORMAL LOW (ref 850–3900)
MCH: 27.7 pg (ref 27.0–33.0)
MCHC: 32.2 g/dL (ref 32.0–36.0)
MCV: 86.3 fL (ref 80.0–100.0)
MONOS PCT: 13 %
Monocytes Absolute: 507 cells/uL (ref 200–950)
Neutro Abs: 2886 cells/uL (ref 1500–7800)
Neutrophils Relative %: 74 %
PLATELETS: 44 10*3/uL — AB (ref 140–400)
RBC: 3.64 MIL/uL — AB (ref 3.80–5.10)
RDW: 19.1 % — AB (ref 11.0–15.0)
WBC: 3.9 10*3/uL (ref 3.8–10.8)

## 2017-03-13 LAB — COMPLETE METABOLIC PANEL WITH GFR
ALT: 13 U/L (ref 6–29)
AST: 23 U/L (ref 10–35)
Albumin: 3.1 g/dL — ABNORMAL LOW (ref 3.6–5.1)
Alkaline Phosphatase: 75 U/L (ref 33–130)
BUN: 12 mg/dL (ref 7–25)
CHLORIDE: 107 mmol/L (ref 98–110)
CO2: 25 mmol/L (ref 20–31)
Calcium: 8.9 mg/dL (ref 8.6–10.4)
Creat: 0.75 mg/dL (ref 0.60–0.93)
GFR, EST AFRICAN AMERICAN: 89 mL/min (ref >=60)
GFR, EST NON AFRICAN AMERICAN: 77 mL/min (ref >=60)
Glucose, Bld: 129 mg/dL — ABNORMAL HIGH (ref 70–99)
Potassium: 3.4 mmol/L — ABNORMAL LOW (ref 3.5–5.3)
Sodium: 142 mmol/L (ref 135–146)
Total Bilirubin: 2.1 mg/dL — ABNORMAL HIGH (ref 0.2–1.2)
Total Protein: 5.2 g/dL — ABNORMAL LOW (ref 6.1–8.1)

## 2017-03-13 MED ORDER — LEVOFLOXACIN 500 MG PO TABS
500.0000 mg | ORAL_TABLET | Freq: Every day | ORAL | 0 refills | Status: DC
Start: 2017-03-13 — End: 2017-03-18

## 2017-03-13 NOTE — Progress Notes (Signed)
Subjective:    Patient ID: [name], female    DOB: [birth year], 76 y.o.   MRN: [id number]  HPI The patient presents with her husband with a 48 hour history of fever to 101-102, occasional cough, fatigue, lethargy. Patient has a history of bladder cancer and had her bladder removed. She has an external urostomy. The urine in her collection bag has been reddish brown dark and foul-smelling as well.   Urinalysis will likely show only contamination given the fact it is a collection bag that is chronic. Exam is significant for faint posterior basilar crackles in the right lower lung Past Medical History:  Diagnosis Date  . Anemia, iron deficiency   . Arthritis   . Bladder cancer [facility]) 2003   Bladder removed - [name]  . Cataract immature   . Cirrhosis ([place]) 08/26/2011  . Colon cancer [facility]) 2001   Hemicolectomy - [name]  . Depression   . GERD (gastroesophageal reflux disease)   . Headache(784.0)   . Hemorrhoids   . History of colon polyps   . History of DVT (deep vein thrombosis) 50+yrs ago   With pregnancy  . History of GI bleed   . History of kidney stones   . History of urostomy    Urostomy d/t hx bladder cancer  . Hx of transfusion of whole blood   . Hypertension   . Joint pain   . Memory loss    Chemotherapy; short and long term  . Osteoporosis   . Thrombocytopenia ([place])    Past Surgical History:  Procedure Laterality Date  . ABDOMINAL HYSTERECTOMY    . APPENDECTOMY    . BLADDER SURGERY     bladder removed d/t cancer  . CHOLECYSTECTOMY    . COLON RESECTION     with colostomy then colostomy reversal  . COLON SURGERY  2004  . COLON SURGERY  2001  . COLONOSCOPY    . ESOPHAGOGASTRODUODENOSCOPY  02/27/2012   Procedure: ESOPHAGOGASTRODUODENOSCOPY (EGD);  Surgeon: [name], MD;  Location: [name];  Service: Endoscopy;  Laterality: N/A;  bedside case  . NEPHROLITHOTOMY Left 05/04/2014   Procedure: LEFT PERCUTANEOUS NEPHROLITHOTOMY ;  Surgeon: [name],  MD;  Location: [facility];  Service: Urology;  Laterality: Left;  . TOTAL KNEE ARTHROPLASTY  2010   Left knee   Current Outpatient Prescriptions on File Prior to Visit  Medication Sig Dispense Refill  . acidophilus (RISAQUAD) CAPS capsule Take 1 capsule by mouth daily.    Marland Kitchen Apoaequorin (PREVAGEN PO) Take 1 tablet by mouth daily.    . Ascorbic Acid (VITAMIN C) 1000 MG tablet Take 1,000 mg by mouth daily.    . cefpodoxime (VANTIN) 200 MG tablet Take 1 tablet (200 mg total) by mouth every 12 (twelve) hours. 10 tablet 0  . Cholecalciferol (VITAMIN D3) 1000 UNITS CAPS Take 1,000 Units by mouth daily.     . Cinnamon 500 MG capsule Take 500 mg by mouth daily.     . citalopram (CELEXA) 20 MG tablet Take 1 tablet (20 mg total) by mouth daily. 30 tablet 0  . citalopram (CELEXA) 40 MG tablet TAKE 1 TABLET BY MOUTH EVERY DAY 90 tablet 0  . Cyanocobalamin (VITAMIN B 12 PO) Take 1 tablet by mouth every morning.     . fexofenadine (ALLEGRA) 180 MG tablet Take 180 mg by mouth daily.    . fish oil-omega-3 fatty acids 1000 MG capsule Take 1,200 mg by mouth daily.     Marland Kitchen  Ginkgo Biloba 60 MG TABS Take 60 mg by mouth every morning.     Marland Kitchen guaiFENesin (MUCINEX) 600 MG 12 hr tablet Take 600 mg by mouth 2 (two) times daily as needed for cough or to loosen phlegm.    Marland Kitchen losartan (COZAAR) 25 MG tablet TAKE 1 TABLET BY MOUTH EVERY DAY, STOP RAMIPRIL 90 tablet 7  . Multiple Vitamin (MULTIVITAMIN WITH MINERALS) TABS tablet Take 1 tablet by mouth daily.    . pantoprazole (PROTONIX) 40 MG tablet Take 1 tablet (40 mg total) by mouth daily. 30 tablet 11  . Propranolol HCl (INDERAL PO) Take 10 mg by mouth.    . triamcinolone cream (KENALOG) 0.1 % Apply 1 application topically 2 (two) times daily. 30 g 0  . vitamin E 100 UNIT capsule Take 400 Units by mouth daily.      No current facility-administered medications on file prior to visit.    Allergies  Allergen Reactions  . Codeine Swelling and Rash    Swelling in throat  .  Morphine And Related Swelling and Rash  . Other Anaphylaxis    Bee Stings  . Phytonadione Other (See Comments)    Patient experienced episode of cyanosis following dose of Vitamin K 10 mg IV.  . Meat Extract     Patient doesn't eat meat   . Promethazine Hcl     Per MD chart - 08/08/11  . Penicillins Rash    Has patient had a PCN reaction causing immediate rash, facial/tongue/throat swelling, SOB or lightheadedness with hypotension: YES Has patient had a PCN reaction causing severe rash involving mucus membranes or skin necrosis: NO Has patient had a PCN reaction that required hospitalization: NO Has patient had a PCN reaction occurring within the last 10 years: NO If all of the above answers are "NO", then may proceed with Cephalosporin use.    Social History   Social History  . Marital status: Married    Spouse name: N/A  . Number of children: N/A  . Years of education: N/A   Occupational History  . Not on file.   Social History Main Topics  . Smoking status: Former Smoker    Packs/day: 0.50    Years: 35.00    Types: Cigarettes    Start date: 10/25/1955    Quit date: 09/22/1991  . Smokeless tobacco: Never Used     Comment: quit 23years ago  . Alcohol use No  . Drug use: No  . Sexual activity: Yes    Birth control/ protection: Surgical   Other Topics Concern  . Not on file   Social History Narrative   Lives at home with her husband, ambulatory      Review of Systems  All other systems reviewed and are negative.      Objective:   Physical Exam  Constitutional: She appears well-developed and well-nourished.  HENT:  Right Ear: External ear normal.  Left Ear: External ear normal.  Nose: Nose normal.  Mouth/Throat: Oropharynx is clear and moist. No oropharyngeal exudate.  Eyes: Conjunctivae are normal.  Neck: Neck supple.  Cardiovascular: Normal rate, regular rhythm and normal heart sounds.   Pulmonary/Chest: Effort normal. No respiratory distress. She has  rales.  Abdominal: Soft. Bowel sounds are normal. She exhibits no distension. There is no tenderness. There is no rebound and no guarding.  Lymphadenopathy:    She has no cervical adenopathy.  Vitals reviewed.         Assessment & Plan:  Fever, unspecified fever  cause - Plan: DG Chest 2 View, CULTURE, URINE COMPREHENSIVE, CBC with Differential/Platelet, COMPLETE METABOLIC PANEL WITH GFR  Given the fact the patient has a urostomy bag, I doubt that a urinalysis will be of much benefit. Grossly the urine appears abnormal as it is dark and slightly tea-colored.  I will send a sample for a urine culture. I will also check the patient for pneumonia with a chest x-ray although my leading suspicion based on her exam is that she has developed a community-acquired pneumonia. Begin the patient on Levaquin 500 milligrams by mouth daily for 7 days. This should also cover for urinary tract infections

## 2017-03-13 NOTE — Telephone Encounter (Signed)
Pharmacy states the drug Levaquin that was recently Rx had a potential interaction with citalopram. Please advise.

## 2017-03-13 NOTE — Telephone Encounter (Signed)
Ok to take levaquin

## 2017-03-14 ENCOUNTER — Encounter (HOSPITAL_COMMUNITY): Payer: Self-pay | Admitting: Nurse Practitioner

## 2017-03-14 ENCOUNTER — Inpatient Hospital Stay (HOSPITAL_COMMUNITY)
Admission: EM | Admit: 2017-03-14 | Discharge: 2017-03-18 | DRG: 871 | Disposition: A | Discharge status: Home Health | Payer: Medicare Other | Attending: Nephrology | Admitting: Nephrology

## 2017-03-14 DIAGNOSIS — E876 Hypokalemia: Secondary | ICD-10-CM | POA: Diagnosis present

## 2017-03-14 DIAGNOSIS — D696 Thrombocytopenia, unspecified: Secondary | ICD-10-CM

## 2017-03-14 DIAGNOSIS — K7460 Unspecified cirrhosis of liver: Secondary | ICD-10-CM | POA: Diagnosis not present

## 2017-03-14 DIAGNOSIS — D649 Anemia, unspecified: Secondary | ICD-10-CM | POA: Diagnosis not present

## 2017-03-14 DIAGNOSIS — J209 Acute bronchitis, unspecified: Secondary | ICD-10-CM | POA: Diagnosis present

## 2017-03-14 DIAGNOSIS — Z85038 Personal history of other malignant neoplasm of large intestine: Secondary | ICD-10-CM | POA: Diagnosis not present

## 2017-03-14 DIAGNOSIS — F329 Major depressive disorder, single episode, unspecified: Secondary | ICD-10-CM | POA: Diagnosis present

## 2017-03-14 DIAGNOSIS — R6520 Severe sepsis without septic shock: Secondary | ICD-10-CM | POA: Diagnosis not present

## 2017-03-14 DIAGNOSIS — G9341 Metabolic encephalopathy: Secondary | ICD-10-CM | POA: Diagnosis present

## 2017-03-14 DIAGNOSIS — J440 Chronic obstructive pulmonary disease with acute lower respiratory infection: Secondary | ICD-10-CM | POA: Diagnosis present

## 2017-03-14 DIAGNOSIS — G9340 Encephalopathy, unspecified: Secondary | ICD-10-CM | POA: Diagnosis not present

## 2017-03-14 DIAGNOSIS — A419 Sepsis, unspecified organism: Secondary | ICD-10-CM | POA: Diagnosis not present

## 2017-03-14 DIAGNOSIS — Z86718 Personal history of other venous thrombosis and embolism: Secondary | ICD-10-CM | POA: Diagnosis not present

## 2017-03-14 DIAGNOSIS — K766 Portal hypertension: Secondary | ICD-10-CM | POA: Diagnosis present

## 2017-03-14 DIAGNOSIS — I10 Essential (primary) hypertension: Secondary | ICD-10-CM | POA: Diagnosis present

## 2017-03-14 DIAGNOSIS — Z79899 Other long term (current) drug therapy: Secondary | ICD-10-CM

## 2017-03-14 DIAGNOSIS — D709 Neutropenia, unspecified: Secondary | ICD-10-CM | POA: Diagnosis not present

## 2017-03-14 DIAGNOSIS — N390 Urinary tract infection, site not specified: Secondary | ICD-10-CM | POA: Diagnosis not present

## 2017-03-14 DIAGNOSIS — I8500 Esophageal varices without bleeding: Secondary | ICD-10-CM | POA: Diagnosis present

## 2017-03-14 DIAGNOSIS — C679 Malignant neoplasm of bladder, unspecified: Secondary | ICD-10-CM | POA: Diagnosis present

## 2017-03-14 DIAGNOSIS — F0390 Unspecified dementia without behavioral disturbance: Secondary | ICD-10-CM | POA: Diagnosis present

## 2017-03-14 DIAGNOSIS — E8809 Other disorders of plasma-protein metabolism, not elsewhere classified: Secondary | ICD-10-CM | POA: Diagnosis present

## 2017-03-14 DIAGNOSIS — K219 Gastro-esophageal reflux disease without esophagitis: Secondary | ICD-10-CM | POA: Diagnosis present

## 2017-03-14 DIAGNOSIS — R161 Splenomegaly, not elsewhere classified: Secondary | ICD-10-CM | POA: Diagnosis not present

## 2017-03-14 DIAGNOSIS — M1990 Unspecified osteoarthritis, unspecified site: Secondary | ICD-10-CM | POA: Diagnosis present

## 2017-03-14 DIAGNOSIS — R160 Hepatomegaly, not elsewhere classified: Secondary | ICD-10-CM

## 2017-03-14 DIAGNOSIS — M810 Age-related osteoporosis without current pathological fracture: Secondary | ICD-10-CM | POA: Diagnosis present

## 2017-03-14 DIAGNOSIS — R531 Weakness: Secondary | ICD-10-CM | POA: Diagnosis not present

## 2017-03-14 DIAGNOSIS — Z880 Allergy status to penicillin: Secondary | ICD-10-CM | POA: Diagnosis not present

## 2017-03-14 DIAGNOSIS — Z936 Other artificial openings of urinary tract status: Secondary | ICD-10-CM | POA: Diagnosis not present

## 2017-03-14 DIAGNOSIS — Z87891 Personal history of nicotine dependence: Secondary | ICD-10-CM

## 2017-03-14 DIAGNOSIS — Z96652 Presence of left artificial knee joint: Secondary | ICD-10-CM | POA: Diagnosis present

## 2017-03-14 DIAGNOSIS — C189 Malignant neoplasm of colon, unspecified: Secondary | ICD-10-CM | POA: Diagnosis present

## 2017-03-14 DIAGNOSIS — Z885 Allergy status to narcotic agent status: Secondary | ICD-10-CM

## 2017-03-14 DIAGNOSIS — Z8551 Personal history of malignant neoplasm of bladder: Secondary | ICD-10-CM | POA: Diagnosis not present

## 2017-03-14 DIAGNOSIS — D61818 Other pancytopenia: Secondary | ICD-10-CM | POA: Diagnosis not present

## 2017-03-14 DIAGNOSIS — Z888 Allergy status to other drugs, medicaments and biological substances status: Secondary | ICD-10-CM | POA: Diagnosis not present

## 2017-03-14 DIAGNOSIS — J189 Pneumonia, unspecified organism: Secondary | ICD-10-CM

## 2017-03-14 LAB — URINALYSIS, COMPLETE (UACMP) WITH MICROSCOPIC
Bilirubin Urine: NEGATIVE
Glucose, UA: NEGATIVE mg/dL
Ketones, ur: NEGATIVE mg/dL
NITRITE: POSITIVE — AB
PH: 6 (ref 5.0–8.0)
Protein, ur: 30 mg/dL — AB
SPECIFIC GRAVITY, URINE: 1.013 (ref 1.005–1.030)

## 2017-03-14 LAB — COMPREHENSIVE METABOLIC PANEL
ALBUMIN: 3 g/dL — AB (ref 3.5–5.0)
ALK PHOS: 70 U/L (ref 38–126)
ALT: 19 U/L (ref 14–54)
AST: 37 U/L (ref 15–41)
Anion gap: 5 (ref 5–15)
BILIRUBIN TOTAL: 2.7 mg/dL — AB (ref 0.3–1.2)
BUN: 13 mg/dL (ref 6–20)
CALCIUM: 9.2 mg/dL (ref 8.9–10.3)
CO2: 26 mmol/L (ref 22–32)
CREATININE: 0.74 mg/dL (ref 0.44–1.00)
Chloride: 104 mmol/L (ref 101–111)
GFR calc Af Amer: >60 mL/min (ref >60)
GLUCOSE: 144 mg/dL — AB (ref 65–99)
POTASSIUM: 3.2 mmol/L — AB (ref 3.5–5.1)
Sodium: 135 mmol/L (ref 135–145)
TOTAL PROTEIN: 5.6 g/dL — AB (ref 6.5–8.1)

## 2017-03-14 LAB — PROTIME-INR
INR: 1.82
PROTHROMBIN TIME: 21.3 s — AB (ref 11.4–15.2)

## 2017-03-14 LAB — CULTURE, URINE COMPREHENSIVE

## 2017-03-14 LAB — CBC WITH DIFFERENTIAL/PLATELET
BASOS ABS: 0 10*3/uL (ref 0.0–0.1)
BASOS PCT: 0 %
EOS ABS: 0 10*3/uL (ref 0.0–0.7)
EOS PCT: 0 %
HCT: 28.6 % — ABNORMAL LOW (ref 36.0–46.0)
Hemoglobin: 9.6 g/dL — ABNORMAL LOW (ref 12.0–15.0)
Lymphocytes Relative: 9 %
Lymphs Abs: 0.3 10*3/uL — ABNORMAL LOW (ref 0.7–4.0)
MCH: 28.2 pg (ref 26.0–34.0)
MCHC: 33.6 g/dL (ref 30.0–36.0)
MCV: 83.9 fL (ref 78.0–100.0)
MONO ABS: 0.4 10*3/uL (ref 0.1–1.0)
Monocytes Relative: 12 %
Neutro Abs: 2.4 10*3/uL (ref 1.7–7.7)
Neutrophils Relative %: 79 %
PLATELETS: 28 10*3/uL — AB (ref 150–400)
RBC: 3.41 MIL/uL — AB (ref 3.87–5.11)
RDW: 18.6 % — AB (ref 11.5–15.5)
WBC: 3.1 10*3/uL — AB (ref 4.0–10.5)

## 2017-03-14 LAB — I-STAT CG4 LACTIC ACID, ED
LACTIC ACID, VENOUS: 2.09 mmol/L — AB (ref 0.5–1.9)
Lactic Acid, Venous: 1.37 mmol/L (ref 0.5–1.9)

## 2017-03-14 LAB — TSH: TSH: 0.563 u[IU]/mL (ref 0.350–4.500)

## 2017-03-14 LAB — AMMONIA: AMMONIA: 60 umol/L — AB (ref 9–35)

## 2017-03-14 LAB — TROPONIN I: TROPONIN I: 0.03 ng/mL — AB (ref <0.03)

## 2017-03-14 MED ORDER — RISAQUAD PO CAPS
1.0000 | ORAL_CAPSULE | Freq: Every day | ORAL | Status: DC
  Administered 2017-03-14 – 2017-03-18 (×5): 1 via ORAL
  Filled 2017-03-14 (×5): qty 1

## 2017-03-14 MED ORDER — ACETAMINOPHEN 325 MG PO TABS
650.0000 mg | ORAL_TABLET | Freq: Four times a day (QID) | ORAL | Status: DC | PRN
  Administered 2017-03-14 – 2017-03-16 (×5): 650 mg via ORAL
  Filled 2017-03-14 (×6): qty 2

## 2017-03-14 MED ORDER — ONDANSETRON HCL 4 MG PO TABS: 4.0000 mg | ORAL_TABLET | Freq: Four times a day (QID) | ORAL | Status: DC | PRN

## 2017-03-14 MED ORDER — DEXTROSE 5 % IV SOLN
1.0000 g | INTRAVENOUS | Status: DC
  Administered 2017-03-15 – 2017-03-16 (×2): 1 g via INTRAVENOUS
  Filled 2017-03-14 (×2): qty 10

## 2017-03-14 MED ORDER — ONDANSETRON HCL 4 MG/2ML IJ SOLN: 4.0000 mg | Freq: Four times a day (QID) | INTRAMUSCULAR | Status: DC | PRN

## 2017-03-14 MED ORDER — IPRATROPIUM-ALBUTEROL 0.5-2.5 (3) MG/3ML IN SOLN
3.0000 mL | Freq: Two times a day (BID) | RESPIRATORY_TRACT | Status: DC
  Administered 2017-03-14 – 2017-03-18 (×8): 3 mL via RESPIRATORY_TRACT
  Filled 2017-03-14 (×8): qty 3

## 2017-03-14 MED ORDER — IPRATROPIUM-ALBUTEROL 0.5-2.5 (3) MG/3ML IN SOLN
3.0000 mL | RESPIRATORY_TRACT | Status: DC
  Administered 2017-03-14: 3 mL via RESPIRATORY_TRACT

## 2017-03-14 MED ORDER — LOSARTAN POTASSIUM 25 MG PO TABS
25.0000 mg | ORAL_TABLET | Freq: Every day | ORAL | Status: DC
  Administered 2017-03-14 – 2017-03-18 (×5): 25 mg via ORAL
  Filled 2017-03-14 (×5): qty 1

## 2017-03-14 MED ORDER — DEXTROSE 5 % IV SOLN
500.0000 mg | Freq: Once | INTRAVENOUS | Status: AC
  Administered 2017-03-14: 500 mg via INTRAVENOUS
  Filled 2017-03-14: qty 500

## 2017-03-14 MED ORDER — PANTOPRAZOLE SODIUM 40 MG PO TBEC
40.0000 mg | DELAYED_RELEASE_TABLET | Freq: Every day | ORAL | Status: DC
  Administered 2017-03-14 – 2017-03-18 (×5): 40 mg via ORAL
  Filled 2017-03-14 (×5): qty 1

## 2017-03-14 MED ORDER — SODIUM CHLORIDE 0.9% FLUSH
3.0000 mL | Freq: Two times a day (BID) | INTRAVENOUS | Status: DC
  Administered 2017-03-15 – 2017-03-17 (×6): 3 mL via INTRAVENOUS

## 2017-03-14 MED ORDER — GUAIFENESIN ER 600 MG PO TB12
1200.0000 mg | ORAL_TABLET | Freq: Two times a day (BID) | ORAL | Status: DC
  Administered 2017-03-14 – 2017-03-18 (×9): 1200 mg via ORAL
  Filled 2017-03-14 (×9): qty 2

## 2017-03-14 MED ORDER — ADULT MULTIVITAMIN W/MINERALS CH
1.0000 | ORAL_TABLET | Freq: Every day | ORAL | Status: DC
  Administered 2017-03-14 – 2017-03-18 (×5): 1 via ORAL
  Filled 2017-03-14 (×5): qty 1

## 2017-03-14 MED ORDER — DEXTROSE 5 % IV SOLN
500.0000 mg | INTRAVENOUS | Status: AC
  Administered 2017-03-15 – 2017-03-16 (×2): 500 mg via INTRAVENOUS
  Filled 2017-03-14 (×2): qty 500

## 2017-03-14 MED ORDER — LACTULOSE 10 GM/15ML PO SOLN
20.0000 g | Freq: Three times a day (TID) | ORAL | Status: DC
  Administered 2017-03-14 (×3): 20 g via ORAL
  Filled 2017-03-14 (×8): qty 30

## 2017-03-14 MED ORDER — ALBUTEROL SULFATE (2.5 MG/3ML) 0.083% IN NEBU: 2.5000 mg | INHALATION_SOLUTION | RESPIRATORY_TRACT | Status: DC | PRN

## 2017-03-14 MED ORDER — CEFTRIAXONE SODIUM 1 G IJ SOLR
1.0000 g | Freq: Once | INTRAMUSCULAR | Status: AC
  Administered 2017-03-14: 1 g via INTRAVENOUS
  Filled 2017-03-14: qty 10

## 2017-03-14 MED ORDER — SODIUM CHLORIDE 0.9 % IV BOLUS (SEPSIS)
1000.0000 mL | Freq: Once | INTRAVENOUS | Status: AC
  Administered 2017-03-14: 1000 mL via INTRAVENOUS

## 2017-03-14 MED ORDER — GUAIFENESIN-DM 100-10 MG/5ML PO SYRP: 5.0000 mL | ORAL_SOLUTION | ORAL | Status: DC | PRN

## 2017-03-14 MED ORDER — ACETAMINOPHEN 650 MG RE SUPP: 650.0000 mg | Freq: Four times a day (QID) | RECTAL | Status: DC | PRN

## 2017-03-14 MED ORDER — SODIUM CHLORIDE 0.9 % IV SOLN
INTRAVENOUS | Status: DC
  Administered 2017-03-14 (×2): via INTRAVENOUS

## 2017-03-14 MED ORDER — SODIUM CHLORIDE 0.9 % IV BOLUS (SEPSIS)
500.0000 mL | Freq: Once | INTRAVENOUS | Status: AC
  Administered 2017-03-14: 500 mL via INTRAVENOUS

## 2017-03-14 NOTE — Progress Notes (Signed)
PHARMACY NOTE -  ANTIBIOTIC RENAL DOSE ADJUSTMENT   Request received for Pharmacy to assist with antibiotic renal dose adjustment: Ceftriaxone/Azithromycin  WBC 3.9 Lactate 2.09 SCr 0.75, estimated CrCl 0.73 ml/min Blood cultures: sent  Plan: Ceftriaxone 1g IV q24h Azithromycin 535m IV q24h  Will sign off at this time.  Please reconsult if a change in clinical status warrants re-evaluation of dosage.  EPeggyann Juba PharmD, BCPS Pager: [phone]/23/2018 6:54 AM

## 2017-03-14 NOTE — ED Provider Notes (Addendum)
[name] DEPT Provider Note   CSN: [id number] Arrival date & time: 03/14/17  0503     History   Chief Complaint Chief Complaint  Patient presents with  . Fever  . Pneumonia    HPI [name] is a 76 y.o. female.  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 Pt has had a cough for 2-3 days now and having weakness and fevers. PT went to PCP yday and was started on levaqun for PNA - but the pharmacy didn't fill it. Pt has been unable to sleep all night and acting confused, so husband brought her to the ER. Pt is oriented to self, location - but was unable to tell me her b'day. Pt has no abd pain, nausea, diarrhea. She has hx of UTI and has had confusion in the past with UTI.   Past Medical History:  Diagnosis Date  . Anemia, iron deficiency   . Arthritis   . Bladder cancer [facility]) 2003   Bladder removed - [name]  . Cataract immature   . Cirrhosis ([place]) 08/26/2011  . Colon cancer [facility]) 2001   Hemicolectomy - [name]  . Depression   . GERD (gastroesophageal reflux disease)   . Headache(784.0)   . Hemorrhoids   . History of colon polyps   . History of DVT (deep vein thrombosis) 50+yrs ago   With pregnancy  . History of GI bleed   . History of kidney stones   . History of urostomy    Urostomy d/t hx bladder cancer  . Hx of transfusion of whole blood   . Hypertension   . Joint pain   . Memory loss    Chemotherapy; short and long term  . Osteoporosis   . Thrombocytopenia [facility])     Patient Active Problem List   Diagnosis Date Noted  . Hypokalemia 11/02/2016  . Acute encephalopathy 11/02/2016  . Vasovagal syncope 11/02/2016  . Near syncope 11/02/2016  . Benign essential HTN 09/11/2016  . Renal stones 05/04/2014  . Pyelonephritis, acute  04/06/2014  . Pyelonephritis 04/06/2014  . Nephrolithiasis 04/06/2014  . Presence of urostomy ([place]) 04/06/2014  . Sepsis ([place]) 02/28/2014  . Severe sepsis ([place]) 02/28/2014  . Hydronephrosis, left 02/28/2014  . E. coli pyelonephritis 02/28/2014  . Leukopenia 03/05/2012  . Varices, esophageal ([place]) 02/29/2012  . Acute upper GI bleeding 02/29/2012  . Gastric ulcer, acute with hemorrhage 02/29/2012  . Dementia with behavioral disturbance 02/26/2012  . Cirrhosis, non-alcoholic ([place]) 40/98/1191  . Melena 02/26/2012  . Acute blood loss anemia 02/26/2012  . Diarrhea 02/26/2012  . Fracture of humerus 02/26/2012  . Ventral hernia without obstruction or gangrene 01/13/2012  . Colon cancer ([place]) 08/26/2011  . Bladder cancer ([place]) 08/26/2011  . Thrombocytopenia ([place]) 08/26/2011  . Anemia, iron deficiency 08/26/2011    Past Surgical History:  Procedure Laterality Date  . ABDOMINAL HYSTERECTOMY    . APPENDECTOMY    . BLADDER SURGERY     bladder removed d/t cancer  . CHOLECYSTECTOMY    . COLON RESECTION     with colostomy then colostomy reversal  . COLON SURGERY  2004  . COLON SURGERY  2001  . COLONOSCOPY    . ESOPHAGOGASTRODUODENOSCOPY  02/27/2012   Procedure: ESOPHAGOGASTRODUODENOSCOPY (EGD);  Surgeon: [name], MD;  Location:  [facility];  Service: Endoscopy;  Laterality: N/A;  bedside case  . NEPHROLITHOTOMY Left 05/04/2014   Procedure: LEFT PERCUTANEOUS NEPHROLITHOTOMY ;  Surgeon: [name], MD;  Location: [facility] ORS;  Service: Urology;  Laterality: Left;  . TOTAL KNEE ARTHROPLASTY  2010   Left knee    OB History    No data available       Home Medications    Prior to Admission medications   Medication Sig Start Date End Date Taking? Authorizing Provider  acidophilus (RISAQUAD) CAPS capsule Take 1 capsule by mouth daily.   Yes [provider]  Apoaequorin (PREVAGEN EXTRA STRENGTH) 20 MG CAPS Take 20 mg by mouth daily.   Yes [provider]  Ascorbic  Acid (VITAMIN C) 1000 MG tablet Take 1,000 mg by mouth daily.   Yes [provider]  cholecalciferol (VITAMIN D) 1000 units tablet Take 1,000 Units by mouth daily.   Yes [provider]  Cinnamon 500 MG capsule Take 500 mg by mouth daily.    Yes [provider]  citalopram (CELEXA) 40 MG tablet Take 20 mg by mouth daily.   Yes [provider]  fexofenadine (ALLEGRA) 180 MG tablet Take 180 mg by mouth daily.   Yes [provider]  Ginkgo Biloba 60 MG TABS Take 60 mg by mouth daily.    Yes [provider]  losartan (COZAAR) 25 MG tablet Take 25 mg by mouth daily.   Yes [provider]  Multiple Vitamin (MULTIVITAMIN WITH MINERALS) TABS tablet Take 1 tablet by mouth daily.   Yes [provider]  omega-3 acid ethyl esters (LOVAZA) 1 g capsule Take 1 g by mouth daily.   Yes [provider]  pantoprazole (PROTONIX) 40 MG tablet Take 1 tablet (40 mg total) by mouth daily. 07/16/16  Yes [name], MD  vitamin B-12 (CYANOCOBALAMIN) 1000 MCG tablet Take 1,000 mcg by mouth daily.   Yes [provider]  vitamin E 400 UNIT capsule Take 400 Units by mouth daily.   Yes [provider]  levofloxacin (LEVAQUIN) 500 MG tablet Take 1 tablet (500 mg total) by mouth daily. Patient not taking: Reported on 03/14/2017 03/13/17   [name], MD    Family History Family History  Problem Relation Age of Onset  . Diabetes Brother   . Cancer Other   . Hypertension Other     Social History Social History  Substance Use Topics  . Smoking status: Former Smoker    Packs/day: 0.50    Years: 35.00    Types: Cigarettes    Start date: 10/25/1955    Quit date: 09/22/1991  . Smokeless tobacco: Never Used     Comment: quit 23years ago  . Alcohol use No     Allergies   Bee venom; Codeine; Morphine and related; Phytonadione; Promethazine hcl; and Penicillins   Review of Systems Review of Systems    Constitutional: Positive for activity change and fever.  Respiratory: Positive for cough and shortness of breath.   Cardiovascular: Positive for chest pain.  Allergic/Immunologic: Positive for immunocompromised state.  Psychiatric/Behavioral: Positive for confusion.  All other systems reviewed and are negative.    Physical Exam Updated Vital Signs BP (!) 142/80   Pulse 76   Temp (!) 101.4 F (38.6 C) (Rectal)   Resp 15   SpO2 93%   Physical Exam  Constitutional: She appears well-developed and well-nourished.  HENT:  Head: Normocephalic and atraumatic.  Eyes: EOM are normal.  Pupils are equal, round, and reactive to light.  Neck: Neck supple.  Cardiovascular: Normal rate, regular rhythm and normal heart sounds.   No murmur heard. Pulmonary/Chest: Effort normal. No respiratory distress. She has no wheezes. She has no rales.  Rhonchi bilaterally  Abdominal: Soft. She exhibits no distension. There is no tenderness. There is no rebound and no guarding.  Neurological:  restless  Skin: Skin is warm and dry.  Nursing note and vitals reviewed.    ED Treatments / Results  Labs (all labs ordered are listed, but only abnormal results are displayed) Labs Reviewed  COMPREHENSIVE METABOLIC PANEL - Abnormal; Notable for the following:       Result Value   Potassium 3.2 (*)    Glucose, Bld 144 (*)    Total Protein 5.6 (*)    Albumin 3.0 (*)    Total Bilirubin 2.7 (*)    All other components within normal limits  CBC WITH DIFFERENTIAL/PLATELET - Abnormal; Notable for the following:    WBC 3.1 (*)    RBC 3.41 (*)    Hemoglobin 9.6 (*)    HCT 28.6 (*)    RDW 18.6 (*)    Platelets 28 (*)    Lymphs Abs 0.3 (*)    All other components within normal limits  TROPONIN I - Abnormal; Notable for the following:    Troponin I 0.03 (*)    All other components within normal limits  AMMONIA - Abnormal; Notable for the following:    Ammonia 60 (*)    All other components within normal  limits  I-STAT CG4 LACTIC ACID, ED - Abnormal; Notable for the following:    Lactic Acid, Venous 2.09 (*)    All other components within normal limits  CULTURE, BLOOD (ROUTINE X 2)  CULTURE, BLOOD (ROUTINE X 2)  BLOOD GAS, VENOUS  URINALYSIS, COMPLETE (UACMP) WITH MICROSCOPIC  I-STAT CG4 LACTIC ACID, ED    EKG  EKG Interpretation None       Radiology Dg Chest 2 View  Result Date: 03/13/2017 CLINICAL DATA:  Cough for 1 month, shortness of breath, question fever episodes EXAM: CHEST  2 VIEW COMPARISON:  11/02/2016 FINDINGS: Normal heart size and pulmonary vascularity. Atherosclerotic calcification and tortuosity of thoracic aorta. Emphysematous and minimal bronchitic changes consistent with COPD. Minimal bibasilar atelectasis. Parenchymal scarring RIGHT mid lung. No acute infiltrate, pleural effusion or pneumothorax. Diffuse osseous demineralization with scattered endplate spur formation thoracic spine. IMPRESSION: COPD changes with bibasilar atelectasis and RIGHT lung scarring. No acute abnormalities. Aortic Atherosclerosis (ICD10-I70.0) and Emphysema (ICD10-J43.9). Electronically Signed   By: Lavonia Dana M.D.   On: 03/13/2017 16:52    Procedures Procedures (including critical care time)  CRITICAL CARE Performed by: Varney Biles   Total critical care time: 40 minutes  Critical care time was exclusive of separately billable procedures and treating other patients.  Critical care was necessary to treat or prevent imminent or life-threatening deterioration.  Critical care was time spent personally by me on the following activities: development of treatment plan with patient and/or surrogate as well as nursing, discussions with consultants, evaluation of patient's response to treatment, examination of patient, obtaining history from patient or surrogate, ordering and performing treatments and interventions, ordering and review of laboratory studies, ordering and review of  radiographic studies, pulse oximetry and re-evaluation of patient's condition.   Medications Ordered in ED Medications  azithromycin (ZITHROMAX) 500 mg in dextrose 5 % 250 mL IVPB (500 mg Intravenous New Bag/Given 03/14/17 0723)  azithromycin (  ZITHROMAX) 500 mg in dextrose 5 % 250 mL IVPB (not administered)  cefTRIAXone (ROCEPHIN) 1 g in dextrose 5 % 50 mL IVPB (not administered)  sodium chloride 0.9 % bolus 500 mL (500 mLs Intravenous New Bag/Given 03/14/17 0642)  cefTRIAXone (ROCEPHIN) 1 g in dextrose 5 % 50 mL IVPB (0 g Intravenous Stopped 03/14/17 0716)     Initial Impression / Assessment and Plan / ED Course  I have reviewed the triage vital signs and the nursing notes.  Pertinent labs & imaging results that were available during my care of the patient were reviewed by me and considered in my medical decision making (see chart for details).     Pt with liver cirrhosis comes in with cc of cough and dib. She also has some confusion.  DDx: Sepsis syndrome ACS syndrome DKA  ICH / Stroke Infection - pneumonia/UTI/Cellulitis PE Dehydration Electrolyte abnormality Tox syndrome Hepatic encephalopathy  Clinically pt has a PNA. Also has cirrhosis, so we will get ammonia levels to see if there is a component of hepatic encephalopathy. Pt doesn't have asterixis. Pt also has no abd pain and there is no hx of SBP or paracentesis - so I doubt SBP.  Anticipate admission. Sepsis workup initiated.   Final Clinical Impressions(s) / ED Diagnoses   Final diagnoses:  Community acquired pneumonia, unspecified laterality  Thrombocytopenia ([place])  Acute encephalopathy  Severe sepsis [facility])    New Prescriptions New Prescriptions   No medications on file     Varney Biles, MD 03/14/17 0700    Varney Biles, MD 03/14/17 [phone]

## 2017-03-14 NOTE — H&P (Signed)
History and Physical    [name] OMB:[id number] DOB: [birth year] DOA: 03/14/2017  PCP: [name], MD  Patient coming from: Home  Chief Complaint: Generalized weakness and confusion  HPI: [name] is a 76 y.o. female with medical history significant of nonalcoholic cirrhosis, colon and bladder cancer with urostomy came into the hospital because of weakness, cough and confusion. History taken from husband at bedside as she was very forgetful and didn't remember what happened. Apparently she did have fever, nonproductive cough and generalized weakness since yesterday morning, she went to her PCP yesterday levofloxacin was prescribed, unable to take it (per husband pharmacy reported interaction was medication and prescription was not filled), she worsening with more fevers so he brought her to the hospital for further evaluation. In the ED she develop fever of 101.4, CXR from yesterday showed a scarring, UAs pending.  ED Course:  Vitals: Fever of 101.2, RR 22 Labs: WBC 3.1, hemoglobin 9.6 and platelets 28 Imaging: CXR from yesterday showed scarring in the right side Interventions: Started on Rocephin and azithromycin  Review of Systems:  Unobtainable because of confusion  Past Medical History:  Diagnosis Date  . Anemia, iron deficiency   . Arthritis   . Bladder cancer [facility]) 2003   Bladder removed - [name]  . Cataract immature   . Cirrhosis ([place]) 08/26/2011  . Colon cancer [facility]) 2001   Hemicolectomy - [name]  . Depression   . GERD (gastroesophageal reflux disease)   . Headache(784.0)   . Hemorrhoids   . History of colon polyps   . History of DVT (deep vein thrombosis) 50+yrs ago   With pregnancy  . History of GI bleed   . History of kidney stones   . History of urostomy    Urostomy d/t hx bladder cancer  . Hx of transfusion of whole blood   . Hypertension   . Joint pain   . Memory loss    Chemotherapy; short and long term  . Osteoporosis   .  Thrombocytopenia ([place])     Past Surgical History:  Procedure Laterality Date  . ABDOMINAL HYSTERECTOMY    . APPENDECTOMY    . BLADDER SURGERY     bladder removed d/t cancer  . CHOLECYSTECTOMY    . COLON RESECTION     with colostomy then colostomy reversal  . COLON SURGERY  2004  . COLON SURGERY  2001  . COLONOSCOPY    . ESOPHAGOGASTRODUODENOSCOPY  02/27/2012   Procedure: ESOPHAGOGASTRODUODENOSCOPY (EGD);  Surgeon: [name], MD;  Location: [name];  Service: Endoscopy;  Laterality: N/A;  bedside case  . NEPHROLITHOTOMY Left 05/04/2014   Procedure: LEFT PERCUTANEOUS NEPHROLITHOTOMY ;  Surgeon: [name], MD;  Location: [facility];  Service: Urology;  Laterality: Left;  . TOTAL KNEE ARTHROPLASTY  2010   Left knee     reports that she quit smoking about 25 years ago. Her smoking use included Cigarettes. She started smoking about 61 years ago. She has a 17.50 pack-year smoking history. She has never used smokeless tobacco. She reports that she does not drink alcohol or use drugs.  Allergies  Allergen Reactions  . Bee Venom Anaphylaxis  . Codeine Anaphylaxis and Rash  . Morphine And Related Anaphylaxis and Rash  . Phytonadione Other (See Comments)    Pt experienced an episode of cyanosis following dose of Vitamin K 10 mg IV.  Marland Kitchen Promethazine Hcl Other (See Comments)    Reaction:  Unknown   .  Penicillins Rash and Other (See Comments)    Has patient had a PCN reaction causing immediate rash, facial/tongue/throat swelling, SOB or lightheadedness with hypotension: No Has patient had a PCN reaction causing severe rash involving mucus membranes or skin necrosis: No Has patient had a PCN reaction that required hospitalization: No Has patient had a PCN reaction occurring within the last 10 years: No If all of the above answers are "NO", then may proceed with Cephalosporin use.    Family History  Problem Relation Age of Onset  . Diabetes Brother   . Cancer Other   . Hypertension  Other     Prior to Admission medications   Medication Sig Start Date End Date Taking? Authorizing Provider  acidophilus (RISAQUAD) CAPS capsule Take 1 capsule by mouth daily.   Yes [provider]  Apoaequorin (PREVAGEN EXTRA STRENGTH) 20 MG CAPS Take 20 mg by mouth daily.   Yes [provider]  Ascorbic Acid (VITAMIN C) 1000 MG tablet Take 1,000 mg by mouth daily.   Yes [provider]  cholecalciferol (VITAMIN D) 1000 units tablet Take 1,000 Units by mouth daily.   Yes [provider]  Cinnamon 500 MG capsule Take 500 mg by mouth daily.    Yes [provider]  citalopram (CELEXA) 40 MG tablet Take 20 mg by mouth daily.   Yes [provider]  fexofenadine (ALLEGRA) 180 MG tablet Take 180 mg by mouth daily.   Yes [provider]  Ginkgo Biloba 60 MG TABS Take 60 mg by mouth daily.    Yes [provider]  losartan (COZAAR) 25 MG tablet Take 25 mg by mouth daily.   Yes [provider]  Multiple Vitamin (MULTIVITAMIN WITH MINERALS) TABS tablet Take 1 tablet by mouth daily.   Yes [provider]  omega-3 acid ethyl esters (LOVAZA) 1 g capsule Take 1 g by mouth daily.   Yes [provider]  pantoprazole (PROTONIX) 40 MG tablet Take 1 tablet (40 mg total) by mouth daily. 07/16/16  Yes [name], MD  vitamin B-12 (CYANOCOBALAMIN) 1000 MCG tablet Take 1,000 mcg by mouth daily.   Yes [provider]  vitamin E 400 UNIT capsule Take 400 Units by mouth daily.   Yes [provider]  levofloxacin (LEVAQUIN) 500 MG tablet Take 1 tablet (500 mg total) by mouth daily. Patient not taking: Reported on 03/14/2017 03/13/17   [name], MD    Physical Exam:  Vitals:   03/14/17 6761 03/14/17 0518 03/14/17 9509 03/14/17 0632  BP:  (!) 148/80  (!) 142/80  Pulse:    76  Resp:  (!) 22  15  Temp:   (!) 101.4 F (38.6 C)   TempSrc:   Rectal   SpO2: 94% 95%  93%    Constitutional:  NAD, calm, comfortable Eyes: PERRL, lids and conjunctivae normal ENMT: Mucous membranes are moist. Posterior pharynx clear of any exudate or lesions.Normal dentition.  Neck: normal, supple, no masses, no thyromegaly Respiratory: clear to auscultation bilaterally, no wheezing, no crackles. Normal respiratory effort. No accessory muscle use.  Cardiovascular: Regular rate and rhythm, no murmurs / rubs / gallops. No extremity edema. 2+ pedal pulses. No carotid bruits.  Abdomen: no tenderness, no masses palpated. No hepatosplenomegaly. Bowel sounds positive.  Musculoskeletal: no clubbing / cyanosis. No joint deformity upper and lower extremities. Good ROM, no contractures. Normal muscle tone.  Skin: no rashes, lesions, ulcers. No induration Neurologic: CN 2-12 grossly intact. Sensation intact, DTR  normal. Strength 5/5 in all 4.  Psychiatric: Normal judgment and insight. Alert and oriented x 3. Normal mood.   Labs on Admission: I have personally reviewed following labs and imaging studies  CBC:  Recent Labs Lab 03/13/17 1149 03/14/17 0605  WBC 3.9 3.1*  NEUTROABS 2,886 2.4  HGB 10.1* 9.6*  HCT 31.4* 28.6*  MCV 86.3 83.9  PLT 44* 28*   Basic Metabolic Panel:  Recent Labs Lab 03/13/17 1149 03/14/17 0605  NA 142 135  K 3.4* 3.2*  CL 107 104  CO2 25 26  GLUCOSE 129* 144*  BUN 12 13  CREATININE 0.75 0.74  CALCIUM 8.9 9.2   GFR: Estimated Creatinine Clearance: 72.8 mL/min (by C-G formula based on SCr of 0.74 mg/dL). Liver Function Tests:  Recent Labs Lab 03/13/17 1149 03/14/17 0605  AST 23 37  ALT 13 19  ALKPHOS 75 70  BILITOT 2.1* 2.7*  PROT 5.2* 5.6*  ALBUMIN 3.1* 3.0*   No results for input(s): LIPASE, AMYLASE in the last 168 hours.  Recent Labs Lab 03/14/17 0606  AMMONIA 60*   Coagulation Profile: No results for input(s): INR, PROTIME in the last 168 hours. Cardiac Enzymes:  Recent Labs Lab 03/14/17 0605  TROPONINI 0.03*   BNP (last 3 results) No  results for input(s): PROBNP in the last 8760 hours. HbA1C: No results for input(s): HGBA1C in the last 72 hours. CBG: No results for input(s): GLUCAP in the last 168 hours. Lipid Profile: No results for input(s): CHOL, HDL, LDLCALC, TRIG, CHOLHDL, LDLDIRECT in the last 72 hours. Thyroid Function Tests: No results for input(s): TSH, T4TOTAL, FREET4, T3FREE, THYROIDAB in the last 72 hours. Anemia Panel: No results for input(s): VITAMINB12, FOLATE, FERRITIN, TIBC, IRON, RETICCTPCT in the last 72 hours. Urine analysis:    Component Value Date/Time   COLORURINE YELLOW 11/13/2016 1056   APPEARANCEUR CLOUDY (A) 11/13/2016 1056   LABSPEC 1.015 11/13/2016 1056   PHURINE 7.0 11/13/2016 1056   GLUCOSEU NEGATIVE 11/13/2016 1056   HGBUR 1+ (A) 11/13/2016 1056   BILIRUBINUR NEGATIVE 11/13/2016 1056   KETONESUR NEGATIVE 11/13/2016 1056   PROTEINUR 1+ (A) 11/13/2016 1056   UROBILINOGEN 1.0 04/06/2014 1856   NITRITE NEGATIVE 11/13/2016 1056   LEUKOCYTESUR TRACE (A) 11/13/2016 1056   Sepsis Labs: !!!!!!!!!!!!!!!!!!!!!!!!!!!!!!!!!!!!!!!!!!!! Invalid input(s): PROCALCITONIN, LACTICIDVEN No results found for this or any previous visit (from the past 240 hour(s)).   Radiological Exams on Admission: Dg Chest 2 View  Result Date: 03/13/2017 CLINICAL DATA:  Cough for 1 month, shortness of breath, question fever episodes EXAM: CHEST  2 VIEW COMPARISON:  11/02/2016 FINDINGS: Normal heart size and pulmonary vascularity. Atherosclerotic calcification and tortuosity of thoracic aorta. Emphysematous and minimal bronchitic changes consistent with COPD. Minimal bibasilar atelectasis. Parenchymal scarring RIGHT mid lung. No acute infiltrate, pleural effusion or pneumothorax. Diffuse osseous demineralization with scattered endplate spur formation thoracic spine. IMPRESSION: COPD changes with bibasilar atelectasis and RIGHT lung scarring. No acute abnormalities. Aortic Atherosclerosis (ICD10-I70.0) and Emphysema  (ICD10-J43.9). Electronically Signed   By: Lavonia Dana M.D.   On: 03/13/2017 16:52    EKG: Independently reviewed.   Assessment/Plan Active Problems:   Colon cancer (HCC)   Bladder cancer (HCC)   Cirrhosis, non-alcoholic (HCC)   Presence of urostomy ([place])   Acute encephalopathy    Sepsis -Presented with temp of 101.4, WBC 3.1 and RR of 22 with suspicion of infection. -Initial lactic acid is 2.09 suggesting hypoperfusion, repeat is 1.3. -Started on IV fluids and IV antibiotics, source likely acute  bronchitis/early pneumonia, cannot rule out UTI.  Acute bronchitis/early pneumonia -Presented with fever, per husband, has cough. CXR showed scarring, and scattered rhonchi. -Started on Rocephin and azithromycin, has history of COPD but no wheezing, steroids are started. -Supportive management with bronchodilators, mucolytics, antitussives and oxygen as needed.  History of urostomy -History of recurrent UTIs, UA pending but anyway patient is on Rocephin which is going to cover.  History of colon cancer/history of bladder cancer -Follows with Dr. [name] as outpatient, she is been cancer free for the past 5 years per husband.  Acute metabolic encephalopathy -She is awake but confused, not usual for her per husband. -Ammonia slightly elevated at 60 started on lactulose. -I think her metabolic encephalopathies likely secondary to infection rather than to hepatic encephalopathy.  Hepatic cirrhosis, nonalcoholic -Hypoalbuminemia, hyperbilirubinemia, pancytopenia and hyperammonemia. -Started on low dose of lactulose, no evidence of bleeding. Has history of grade 2 esophageal varices.  Thrombocytopenia -Significant thrombocytopenia with platelets of 28, no evidence of bleeding, this is likely secondary to cirrhosis. -No chemical DVT prophylaxis, SCDs.   DVT prophylaxis: SCDs Code Status: Full code Family Communication: Plan D/W patient's husband at bedside Disposition Plan:  Home Consults called:  Admission status: Inpatient   [facility] A MD [facility] Pager [phone]  If 7PM-7AM, please contact night-coverage [email or web address] Password [facility]  03/14/2017, 8:27 AM

## 2017-03-14 NOTE — ED Triage Notes (Signed)
Patient presents from home with husband with c/o fever. He reports her temp was 102 at home around 0400. He stated she was in the bed restless and hot which woke him. Patient is alert to self, husband, and place, unable to verify dob, correct month or year. Per husband she went to Dr, Dennard Schaumann (PCP) on yesterday and was dx with PNA. She was prescribed antibiotics however the pharmacy notified him at pick up that the medication interacted with some of her other medicines and they would have to contact Dr. Dennard Schaumann to be able to override the request. He reports he supposed to go pick it up today. Patient is afebrile presently without any interventions at home per the husband.

## 2017-03-15 DIAGNOSIS — J189 Pneumonia, unspecified organism: Secondary | ICD-10-CM

## 2017-03-15 LAB — BASIC METABOLIC PANEL
Anion gap: 6 (ref 5–15)
BUN: 13 mg/dL (ref 6–20)
CO2: 25 mmol/L (ref 22–32)
Calcium: 9 mg/dL (ref 8.9–10.3)
Chloride: 106 mmol/L (ref 101–111)
Creatinine, Ser: 0.62 mg/dL (ref 0.44–1.00)
GFR calc Af Amer: >60 mL/min (ref >60)
GLUCOSE: 134 mg/dL — AB (ref 65–99)
POTASSIUM: 3.3 mmol/L — AB (ref 3.5–5.1)
Sodium: 137 mmol/L (ref 135–145)

## 2017-03-15 LAB — CBC
HEMATOCRIT: 27 % — AB (ref 36.0–46.0)
Hemoglobin: 9 g/dL — ABNORMAL LOW (ref 12.0–15.0)
MCH: 28 pg (ref 26.0–34.0)
MCHC: 33.3 g/dL (ref 30.0–36.0)
MCV: 83.9 fL (ref 78.0–100.0)
Platelets: 24 10*3/uL — CL (ref 150–400)
RBC: 3.22 MIL/uL — ABNORMAL LOW (ref 3.87–5.11)
RDW: 18.8 % — AB (ref 11.5–15.5)
WBC: 1.8 10*3/uL — ABNORMAL LOW (ref 4.0–10.5)

## 2017-03-15 LAB — HEMOGLOBIN A1C
Hgb A1c MFr Bld: 5.2 % (ref 4.8–5.6)
MEAN PLASMA GLUCOSE: 103 mg/dL

## 2017-03-15 MED ORDER — POTASSIUM CHLORIDE CRYS ER 20 MEQ PO TBCR
40.0000 meq | EXTENDED_RELEASE_TABLET | Freq: Every day | ORAL | Status: DC
  Administered 2017-03-15 – 2017-03-16 (×2): 40 meq via ORAL
  Filled 2017-03-15 (×2): qty 2

## 2017-03-15 NOTE — Progress Notes (Signed)
PROGRESS NOTE    [name]  GGE:[id number] DOB: [birth year] DOA: 03/14/2017 PCP: [name], MD   Brief Narrative: 76 y.o. female with medical history significant of nonalcoholic cirrhosis, colon and bladder cancer with urostomy came into the hospital because of weakness, cough and confusion. Patient went to her PCP the day prior to admission when levofloxacin was prescribed, unable to take it (per husband pharmacy reported interaction was medication and prescription was not filled), she worsening with more fevers so he brought her to the hospital for further evaluation. In the ED she develop fever of 101.4, CXR  showed a scarring  Assessment & Plan:   #Possible sepsis due to acute bronchitis/early pneumonia on admission: -Lactic acid improved. Continue azithromycin, ceftriaxone. -Follow up culture results -Monitor fever curve. -Urine with possible UTI. Follow up culture results. -Continue medical light is, bronchodilators and oxygen as needed.  #Acute metabolic encephalopathy versus acute hepatitic encephalopathy: Mild elevation in ammonia level. Continue lactulose. Mental status improving. Continue treatment sepsis. She has no focal neurological deficit.  # Non-alcohol the liver cirrhosis with mild elevation in ammonia level: Continue lactulose.   #History of colon cancer/pleura cancer: She follows up with her oncologist Dr. and liver.  #History of urostomy with recurrent UTI: On ceftriaxone. Follow up culture results.  #Pancytopenia: Acute on chronic. No sign of bleeding. Monitor CBC. Recommended to follow-up with hematology oncology. It may be due to the setting of sepsis.  #Hypokalemia: Replete potassium chloride. Monitor labs.  PT OT evaluation.  Active Problems:   Colon cancer ([place])   Bladder cancer ([place])   Cirrhosis, non-alcoholic ([place])   Presence of urostomy ([place])   Acute encephalopathy  DVT prophylaxis: SCD. Pancytopenia. Code Status: Full code Family  Communication: Discussed with the patient has pain and daughter at bedside Disposition Plan: Currently admitted    Consultants:   None  Procedures: None Antimicrobials: Ceftriaxone and azithromycin since June 23  Subjective: Seen and examined at bedside. Patient is alert awake and oriented to herself, [facility] and [place]. Denied nausea vomiting chest pain or shortness of breath. Review of systems Limited.  Objective: Vitals:   03/14/17 2356 03/15/17 0200 03/15/17 0657 03/15/17 0901  BP:   (!) 146/59   Pulse:   74   Resp:   20   Temp: 99 F (37.2 C) 100.3 F (37.9 C) 98.2 F (36.8 C)   TempSrc: Oral Oral Oral   SpO2:   96% 93%  Weight:      Height:        Intake/Output Summary (Last 24 hours) at 03/15/17 1317 Last data filed at 03/15/17 1104  Gross per 24 hour  Intake          1638.75 ml  Output              500 ml  Net          1138.75 ml   Filed Weights   03/14/17 0900  Weight: 111 kg (244 lb 11.4 oz)    Examination:  General exam: Appears calm and comfortable  Respiratory system: Clear to auscultation. Respiratory effort normal. No wheezing or crackle Cardiovascular system: S1 & S2 heard, RRR.  No pedal edema. Gastrointestinal system: Abdomen is nondistended, soft and nontender. Normal bowel sounds heard. Central nervous system: AlertAwake and following simple commands Skin: No rashes, lesions or ulcers Psychiatry: Judgement and insight appear impaired.    Data Reviewed: I have personally reviewed following labs and imaging studies  CBC:  Recent Labs  Lab 03/13/17 1149 03/14/17 0605 03/15/17 0543  WBC 3.9 3.1* 1.8*  NEUTROABS 2,886 2.4  --   HGB 10.1* 9.6* 9.0*  HCT 31.4* 28.6* 27.0*  MCV 86.3 83.9 83.9  PLT 44* 28* 24*   Basic Metabolic Panel:  Recent Labs Lab 03/13/17 1149 03/14/17 0605 03/15/17 0543  NA 142 135 137  K 3.4* 3.2* 3.3*  CL 107 104 106  CO2 25 26 25   GLUCOSE 129* 144* 134*  BUN 12 13 13   CREATININE 0.75 0.74 0.62    CALCIUM 8.9 9.2 9.0   GFR: Estimated Creatinine Clearance: 73.1 mL/min (by C-G formula based on SCr of 0.62 mg/dL). Liver Function Tests:  Recent Labs Lab 03/13/17 1149 03/14/17 0605  AST 23 37  ALT 13 19  ALKPHOS 75 70  BILITOT 2.1* 2.7*  PROT 5.2* 5.6*  ALBUMIN 3.1* 3.0*   No results for input(s): LIPASE, AMYLASE in the last 168 hours.  Recent Labs Lab 03/14/17 0606  AMMONIA 60*   Coagulation Profile:  Recent Labs Lab 03/14/17 1137  INR 1.82   Cardiac Enzymes:  Recent Labs Lab 03/14/17 0605  TROPONINI 0.03*   BNP (last 3 results) No results for input(s): PROBNP in the last 8760 hours. HbA1C:  Recent Labs  03/14/17 1137  HGBA1C 5.2   CBG: No results for input(s): GLUCAP in the last 168 hours. Lipid Profile: No results for input(s): CHOL, HDL, LDLCALC, TRIG, CHOLHDL, LDLDIRECT in the last 72 hours. Thyroid Function Tests:  Recent Labs  03/14/17 1137  TSH 0.563   Anemia Panel: No results for input(s): VITAMINB12, FOLATE, FERRITIN, TIBC, IRON, RETICCTPCT in the last 72 hours. Sepsis Labs:  Recent Labs Lab 03/14/17 5681 03/14/17 0811  LATICACIDVEN 2.09* 1.37    Recent Results (from the past 240 hour(s))  CULTURE, URINE COMPREHENSIVE     Status: None   Collection Time: 03/13/17 11:00 AM  Result Value Ref Range Status   Colony Count Greater than 100,000 CFU/mL  Final   Organism ID, Bacteria Multiple Gram Negative Rods   Final    Comment: May represent colonizers from external and internal genitalia.No further testing(including susceptibility)will be performed.    Colony Count 50,000-100,000 CFU/mL  Final   Organism ID, Bacteria Gram Positive Cocci  Final  Blood Culture (routine x 2)     Status: None (Preliminary result)   Collection Time: 03/14/17  6:05 AM  Result Value Ref Range Status   Specimen Description BLOOD BLOOD LEFT FOREARM  Final   Special Requests   Final    BOTTLES DRAWN AEROBIC AND ANAEROBIC Blood Culture adequate  volume   Culture   Final    NO GROWTH 1 DAY Performed at [facility], [place] [address], [place], [place] [address]    Report Status PENDING  Incomplete  Blood Culture (routine x 2)     Status: None (Preliminary result)   Collection Time: 03/14/17  6:06 AM  Result Value Ref Range Status   Specimen Description BLOOD LEFT HAND  Final   Special Requests   Final    BOTTLES DRAWN AEROBIC AND ANAEROBIC Blood Culture adequate volume   Culture   Final    NO GROWTH 1 DAY Performed at [facility], [place] [address] [place], [place] [address]    Report Status PENDING  Incomplete         Radiology Studies: Dg Chest 2 View  Result Date: 03/13/2017 CLINICAL DATA:  Cough for 1 month, shortness of breath, question fever  episodes EXAM: CHEST  2 VIEW COMPARISON:  11/02/2016 FINDINGS: Normal heart size and pulmonary vascularity. Atherosclerotic calcification and tortuosity of thoracic aorta. Emphysematous and minimal bronchitic changes consistent with COPD. Minimal bibasilar atelectasis. Parenchymal scarring RIGHT mid lung. No acute infiltrate, pleural effusion or pneumothorax. Diffuse osseous demineralization with scattered endplate spur formation thoracic spine. IMPRESSION: COPD changes with bibasilar atelectasis and RIGHT lung scarring. No acute abnormalities. Aortic Atherosclerosis (ICD10-I70.0) and Emphysema (ICD10-J43.9). Electronically Signed   By: Lavonia Dana M.D.   On: 03/13/2017 16:52        Scheduled Meds: . acidophilus  1 capsule Oral Daily  . guaiFENesin  1,200 mg Oral BID  . ipratropium-albuterol  3 mL Nebulization BID  . lactulose  20 g Oral TID  . losartan  25 mg Oral Daily  . multivitamin with minerals  1 tablet Oral Daily  . pantoprazole  40 mg Oral Daily  . potassium chloride  40 mEq Oral Daily  . sodium chloride flush  3 mL Intravenous Q12H   Continuous Infusions: . azithromycin 500 mg (03/15/17 0922)  . cefTRIAXone (ROCEPHIN)  IV 1 g (03/15/17 0923)      LOS: 1 day    Kenika Sahm Tanna Furry, MD [facility] Pager [phone]  If 7PM-7AM, please contact night-coverage [email or web address] Password [email or web address] 03/15/2017, 1:17 PM

## 2017-03-16 ENCOUNTER — Encounter (HOSPITAL_COMMUNITY): Payer: Self-pay

## 2017-03-16 DIAGNOSIS — D61818 Other pancytopenia: Secondary | ICD-10-CM

## 2017-03-16 LAB — CBC
HCT: 26.5 % — ABNORMAL LOW (ref 36.0–46.0)
Hemoglobin: 8.9 g/dL — ABNORMAL LOW (ref 12.0–15.0)
MCH: 27.6 pg (ref 26.0–34.0)
MCHC: 33.6 g/dL (ref 30.0–36.0)
MCV: 82.3 fL (ref 78.0–100.0)
PLATELETS: 28 10*3/uL — AB (ref 150–400)
RBC: 3.22 MIL/uL — AB (ref 3.87–5.11)
RDW: 18.8 % — ABNORMAL HIGH (ref 11.5–15.5)
WBC: 0.9 10*3/uL — CL (ref 4.0–10.5)

## 2017-03-16 LAB — DIFFERENTIAL
BASOS PCT: 1 %
Basophils Absolute: 0 10*3/uL (ref 0.0–0.1)
Eosinophils Absolute: 0 10*3/uL (ref 0.0–0.7)
Eosinophils Relative: 2 %
Lymphocytes Relative: 40 %
Lymphs Abs: 0.4 10*3/uL — ABNORMAL LOW (ref 0.7–4.0)
MONO ABS: 0.2 10*3/uL (ref 0.1–1.0)
Monocytes Relative: 26 %
NEUTROS PCT: 31 %
Neutro Abs: 0.3 10*3/uL — ABNORMAL LOW (ref 1.7–7.7)

## 2017-03-16 LAB — URINE CULTURE

## 2017-03-16 LAB — BASIC METABOLIC PANEL
Anion gap: 5 (ref 5–15)
BUN: 12 mg/dL (ref 6–20)
CALCIUM: 9 mg/dL (ref 8.9–10.3)
CO2: 25 mmol/L (ref 22–32)
CREATININE: 0.56 mg/dL (ref 0.44–1.00)
Chloride: 107 mmol/L (ref 101–111)
GLUCOSE: 125 mg/dL — AB (ref 65–99)
Potassium: 4.2 mmol/L (ref 3.5–5.1)
Sodium: 137 mmol/L (ref 135–145)

## 2017-03-16 LAB — BLOOD GAS, VENOUS
ACID-BASE EXCESS: 2.8 mmol/L — AB (ref 0.0–2.0)
BICARBONATE: 25.5 mmol/L (ref 20.0–28.0)
O2 SAT: 79.4 %
PATIENT TEMPERATURE: 98.6
PH VEN: 7.487 — AB (ref 7.250–7.430)
pCO2, Ven: 34 mmHg — ABNORMAL LOW (ref 44.0–60.0)
pO2, Ven: 43.8 mmHg (ref 32.0–45.0)

## 2017-03-16 LAB — AMMONIA

## 2017-03-16 MED ORDER — LEVOFLOXACIN IN D5W 750 MG/150ML IV SOLN
750.0000 mg | INTRAVENOUS | Status: DC
  Administered 2017-03-17: 750 mg via INTRAVENOUS
  Filled 2017-03-16 (×2): qty 150

## 2017-03-16 MED ORDER — POTASSIUM CHLORIDE CRYS ER 20 MEQ PO TBCR
20.0000 meq | EXTENDED_RELEASE_TABLET | Freq: Every day | ORAL | Status: DC
  Administered 2017-03-17 – 2017-03-18 (×2): 20 meq via ORAL
  Filled 2017-03-16 (×2): qty 1

## 2017-03-16 MED ORDER — VANCOMYCIN HCL 10 G IV SOLR
2000.0000 mg | Freq: Once | INTRAVENOUS | Status: DC
  Filled 2017-03-16: qty 2000

## 2017-03-16 MED ORDER — VANCOMYCIN HCL 10 G IV SOLR
2000.0000 mg | Freq: Once | INTRAVENOUS | Status: AC
  Administered 2017-03-16: 2000 mg via INTRAVENOUS
  Filled 2017-03-16: qty 2000

## 2017-03-16 NOTE — Progress Notes (Signed)
Pharmacy Antibiotic Note  [name] is a 76 y.o. female admitted on 03/14/2017 with PNA and UTI.  Pharmacy has been consulted for levaquin dosing. WBC down and she is now neutropenic, Tmax 100.7.  Heme/Onc to be consulted for neutropnenia.  She has received 3 days of Rocephin/azithromycin for acute bronchitis/early PNA.  Her unine culture is + for enterococcus sensitive to levaquin.  To start levaquin to cover both PNA and UTI.  She got both Rocephin and Zithromax doses this am.  She is not able to get levaquin until 24 hrs from last Zithromax dose 2nd to hospital policy due to risk of prolonged QT interval, so will give her 1 dose of vancomycin today.    Plan: vancomycin 2 gm IV x 1 dose today to start UTI treatment and start levaquin 750 mg IV q24 tomorrow 6/26 to cover both CAP and UTI.  F/u renal fxn, wbc, temp, culture data  Height: 5' 5"  (165.1 cm) Weight: 244 lb 11.4 oz (111 kg) IBW/kg (Calculated) : 57  Temp (24hrs), Avg:99.5 F (37.5 C), Min:98.2 F (36.8 C), Max:100.7 F (38.2 C)   Recent Labs Lab 03/13/17 1149 03/14/17 0605 03/14/17 0638 03/14/17 0811 03/15/17 0543 03/16/17 0542  WBC 3.9 3.1*  --   --  1.8* 0.9*  CREATININE 0.75 0.74  --   --  0.62 0.56  LATICACIDVEN  --   --  2.09* 1.37  --   --     Estimated Creatinine Clearance: 73.1 mL/min (by C-G formula based on SCr of 0.56 mg/dL).    Allergies  Allergen Reactions  . Bee Venom Anaphylaxis  . Codeine Anaphylaxis and Rash  . Morphine And Related Anaphylaxis and Rash  . Phytonadione Other (See Comments)    Pt experienced an episode of cyanosis following dose of Vitamin K 10 mg IV.  Marland Kitchen Promethazine Hcl Other (See Comments)    Reaction:  Unknown   . Penicillins Rash and Other (See Comments)    Has patient had a PCN reaction causing immediate rash, facial/tongue/throat swelling, SOB or lightheadedness with hypotension: No Has patient had a PCN reaction causing severe rash involving mucus membranes or skin  necrosis: No Has patient had a PCN reaction that required hospitalization: No Has patient had a PCN reaction occurring within the last 10 years: No If all of the above answers are "NO", then may proceed with Cephalosporin use.    Antimicrobials this admission: Azith 6/23>>6/25 CTX 6/23>>6/25 Vanc x 1 6/25 Lvq 6/26>>  Dose adjustments this admission:  Microbiology results: 6/23 Ucx > 100k enterococcus faecalis- sens to amp/lvq/nitrofurantion/vanc 6/23 BCx2>ngtd 6/22 Ucx 50-100 K  GNR and GPCocci  Thank you for allowing pharmacy to be a part of this patient's care. Eudelia Bunch, Pharm.D. [phone] 03/16/2017 10:48 AM

## 2017-03-16 NOTE — Progress Notes (Signed)
PROGRESS NOTE    [name]  YQI:[id number] DOB: 1941/03/20 DOA: 03/14/2017 PCP: [name], MD   Brief Narrative: 76 y.o. female with medical history significant of nonalcoholic cirrhosis, colon and bladder cancer with urostomy came into the hospital because of weakness, cough and confusion. Patient went to her PCP the day prior to admission when levofloxacin was prescribed, unable to take it (per husband pharmacy reported interaction was medication and prescription was not filled), she worsening with more fevers so he brought her to the hospital for further evaluation. In the ED she develop fever of 101.4, CXR  showed a scarring  Assessment & Plan:   #Possible sepsis due to acute bronchitis/early pneumonia and UTI: -Lactic acid improved. Currently on  azithromycin, ceftriaxone. Urine c/s growing enterococcus sensitive to Levaquin. Will start Levaquin today. Follow up final culture results. Monitor fever curve  #Pancytopenia in the setting of chronic hepatic disease: The pancytopenia mostly white cell count worsen to 0.9 today likely in the setting of infection. Getting differentials. I will place patient on neutropenic precaution. Continue antibiotics. No sign of bleeding. Monitor labs. I consulted patient's hematology oncology Dr. [name] and discussed with him in detail.  #Acute metabolic encephalopathy versus acute hepatitic encephalopathy: Ammonia level improved. Mental status also improving. Continue lactulose with good bowel movements of 4-5 times per day. PT OT and supportive care.  # Non-alcohol the liver cirrhosis with mild elevation in ammonia level: Continue lactulose.   #History of colon cancer/pleura cancer: Recommended to follow-up with her oncologist outpatient.  #History of urostomy with recurrent UTI: Chest 2 Levaquin.  #Hypokalemia: Replete potassium chloride. Serum potassium level improved.  PT OT evaluation.  Active Problems:   Colon cancer ([place])  Bladder cancer ([place])   Cirrhosis, non-alcoholic ([place])   Presence of urostomy ([place])   Acute encephalopathy   Community acquired pneumonia  DVT prophylaxis: SCD. Pancytopenia. Code Status: Full code Family Communication: Discussed with the patient's husband and daughter at bedside Disposition Plan: Currently admitted    Consultants:   Hematology/oncology  Procedures: None Antimicrobials: Ceftriaxone and azithromycin since June 23-25 levaquin since 6/25  Subjective: Seen and examined at bedside. Patient was alert awake. Denied chest pain, shortness of breath, nausea vomiting.  Objective: Vitals:   03/15/17 2036 03/16/17 0629 03/16/17 0714 03/16/17 0741  BP: (!) 157/60 (!) 167/88 (!) 160/78   Pulse: 77 61 70   Resp: 18 20    Temp: (!) 100.7 F (38.2 C) 98.2 F (36.8 C) 98.4 F (36.9 C)   TempSrc: Oral Oral Oral   SpO2: 96% 100% 98% 98%  Weight:      Height:        Intake/Output Summary (Last 24 hours) at 03/16/17 1020 Last data filed at 03/16/17 0630  Gross per 24 hour  Intake              780 ml  Output             1975 ml  Net            -1195 ml   Filed Weights   03/14/17 0900  Weight: 111 kg (244 lb 11.4 oz)    Examination:  General exam: Lying in bed comfortable, not in distress  Respiratory system: Bibasal decreased breath sound with crackle, respiratory effort normal Cardiovascular system: Regular rate and rhythm, S1-2 normal. No pedal edema. Gastrointestinal system: Abdomen soft, nontender, nondistended. Bowel sound positive Central nervous system: Alert awake and following commands Skin: No rashes, lesions or  ulcers Psychiatry: Judgement and insight appear impaired.    Data Reviewed: I have personally reviewed following labs and imaging studies  CBC:  Recent Labs Lab 03/13/17 1149 03/14/17 0605 03/15/17 0543 03/16/17 0542  WBC 3.9 3.1* 1.8* 0.9*  NEUTROABS 2,886 2.4  --  0.3*  HGB 10.1* 9.6* 9.0* 8.9*  HCT 31.4* 28.6* 27.0* 26.5*  MCV  86.3 83.9 83.9 82.3  PLT 44* 28* 24* 28*   Basic Metabolic Panel:  Recent Labs Lab 03/13/17 1149 03/14/17 0605 03/15/17 0543 03/16/17 0542  NA 142 135 137 137  K 3.4* 3.2* 3.3* 4.2  CL 107 104 106 107  CO2 25 26 25 25   GLUCOSE 129* 144* 134* 125*  BUN 12 13 13 12   CREATININE 0.75 0.74 0.62 0.56  CALCIUM 8.9 9.2 9.0 9.0   GFR: Estimated Creatinine Clearance: 73.1 mL/min (by C-G formula based on SCr of 0.56 mg/dL). Liver Function Tests:  Recent Labs Lab 03/13/17 1149 03/14/17 0605  AST 23 37  ALT 13 19  ALKPHOS 75 70  BILITOT 2.1* 2.7*  PROT 5.2* 5.6*  ALBUMIN 3.1* 3.0*   No results for input(s): LIPASE, AMYLASE in the last 168 hours.  Recent Labs Lab 03/14/17 0606 03/16/17 0542  AMMONIA 60* <9*   Coagulation Profile:  Recent Labs Lab 03/14/17 1137  INR 1.82   Cardiac Enzymes:  Recent Labs Lab 03/14/17 0605  TROPONINI 0.03*   BNP (last 3 results) No results for input(s): PROBNP in the last 8760 hours. HbA1C:  Recent Labs  03/14/17 1137  HGBA1C 5.2   CBG: No results for input(s): GLUCAP in the last 168 hours. Lipid Profile: No results for input(s): CHOL, HDL, LDLCALC, TRIG, CHOLHDL, LDLDIRECT in the last 72 hours. Thyroid Function Tests:  Recent Labs  03/14/17 1137  TSH 0.563   Anemia Panel: No results for input(s): VITAMINB12, FOLATE, FERRITIN, TIBC, IRON, RETICCTPCT in the last 72 hours. Sepsis Labs:  Recent Labs Lab 03/14/17 5409 03/14/17 0811  LATICACIDVEN 2.09* 1.37    Recent Results (from the past 240 hour(s))  CULTURE, URINE COMPREHENSIVE     Status: None   Collection Time: 03/13/17 11:00 AM  Result Value Ref Range Status   Colony Count Greater than 100,000 CFU/mL  Final   Organism ID, Bacteria Multiple Gram Negative Rods   Final    Comment: May represent colonizers from external and internal genitalia.No further testing(including susceptibility)will be performed.    Colony Count 50,000-100,000 CFU/mL  Final    Organism ID, Bacteria Gram Positive Cocci  Final  Blood Culture (routine x 2)     Status: None (Preliminary result)   Collection Time: 03/14/17  6:05 AM  Result Value Ref Range Status   Specimen Description BLOOD BLOOD LEFT FOREARM  Final   Special Requests   Final    BOTTLES DRAWN AEROBIC AND ANAEROBIC Blood Culture adequate volume   Culture   Final    NO GROWTH 1 DAY Performed at [facility], [place] [address], [place], [place] [address]    Report Status PENDING  Incomplete  Blood Culture (routine x 2)     Status: None (Preliminary result)   Collection Time: 03/14/17  6:06 AM  Result Value Ref Range Status   Specimen Description BLOOD LEFT HAND  Final   Special Requests   Final    BOTTLES DRAWN AEROBIC AND ANAEROBIC Blood Culture adequate volume   Culture   Final    NO GROWTH 1 DAY Performed at [facility], [address], [place], [place] [address]    Report Status PENDING  Incomplete  Culture, Urine     Status: Abnormal   Collection Time: 03/14/17  9:28 AM  Result Value Ref Range Status   Specimen Description URINE, RANDOM  Final   Special Requests NONE  Final   Culture >=100,000 COLONIES/mL ENTEROCOCCUS FAECALIS (A)  Final   Report Status 03/16/2017 FINAL  Final   Organism ID, Bacteria ENTEROCOCCUS FAECALIS (A)  Final      Susceptibility   Enterococcus faecalis - MIC*    AMPICILLIN <=2 SENSITIVE Sensitive     LEVOFLOXACIN 2 SENSITIVE Sensitive     NITROFURANTOIN <=16 SENSITIVE Sensitive     VANCOMYCIN 1 SENSITIVE Sensitive     * >=100,000 COLONIES/mL ENTEROCOCCUS FAECALIS         Radiology Studies: No results found.      Scheduled Meds: . acidophilus  1 capsule Oral Daily  . guaiFENesin  1,200 mg Oral BID  . ipratropium-albuterol  3 mL Nebulization BID  . lactulose  20 g Oral TID  . losartan  25 mg Oral Daily  . multivitamin with minerals  1 tablet Oral Daily  . pantoprazole  40 mg Oral Daily  . potassium chloride  40 mEq Oral Daily  .  sodium chloride flush  3 mL Intravenous Q12H   Continuous Infusions: . cefTRIAXone (ROCEPHIN)  IV 1 g (03/16/17 0857)     LOS: 2 days    Ricky Doan Tanna Furry, MD [facility] Pager [phone]  If 7PM-7AM, please contact night-coverage [email or web address] Password [email or web address] 03/16/2017, 10:20 AM

## 2017-03-16 NOTE — Evaluation (Signed)
Physical Therapy Evaluation Patient Details Name: [name] MRN: [id number] DOB: 22-Apr-1941 Today's Date: 03/16/2017   History of Present Illness  [name] is a 76 y.o. female with medical history significant of remote tobacco/smoker, L TKA, dementia, nonalcoholic cirrhosis, colon and bladder cancer with urostomy came into the hospital because of weakness, cough and confusion  Clinical Impression  Pt admitted with above diagnosis. Pt currently with functional limitations due to the deficits listed below (see PT Problem List).  Pt will benefit from skilled PT to increase their independence and safety with mobility to allow discharge to the venue listed below.   Pt cooperative with PT, encouraged OOB; will continue to follow in acute setting    Follow Up Recommendations No PT follow up;Other (comment) (may benefit from HHPT, husb feels don't really need it)    Equipment Recommendations  None recommended by PT    Recommendations for Other Services       Precautions / Restrictions Precautions Precautions: Fall Restrictions Weight Bearing Restrictions: No      Mobility  Bed Mobility Overal bed mobility: Needs Assistance Bed Mobility: Supine to Sit     Supine to sit: Min assist     General bed mobility comments: assist to elevate trunk, facilitation/tactile/verbal/visual cues to bring LEs off bed  Transfers Overall transfer level: Needs assistance Equipment used: Rolling walker (2 wheeled) Transfers: Sit to/from Stand Sit to Stand: Min assist         General transfer comment: light assist to rist and stabilize, cues for hand placement   Ambulation/Gait Ambulation/Gait assistance: Min assist Ambulation Distance (Feet): 80 Feet Assistive device: Rolling walker (2 wheeled) Gait Pattern/deviations: Step-through pattern;Decreased stride length     General Gait Details: assist for balance and to maneuver RW; cues for trunk extension and breathing; O2 sats  96% on RA   Stairs            Wheelchair Mobility    Modified Rankin (Stroke Patients Only)       Balance Overall balance assessment: Needs assistance Sitting-balance support: Feet supported Sitting balance-Leahy Scale: Fair       Standing balance-Leahy Scale: Poor Standing balance comment: reliant on UE support                             Pertinent Vitals/Pain Pain Assessment: Faces Faces Pain Scale: Hurts a little bit Pain Location: right arm/IV site Pain Descriptors / Indicators: Sore Pain Intervention(s): Monitored during session    Home Living Family/patient expects to be discharged to:: Private residence Living Arrangements: Spouse/significant other Available Help at Discharge: Personal care attendant (aide 3x/wk for 4hrs per day) Type of Home: House Home Access: Ramped entrance     Home Layout: Two level;Able to live on main level with bedroom/bathroom Home Equipment: Walker - 4 wheels;Cane - single point;Bedside commode;Shower seat;Grab bars - tub/shower;Wheelchair - manual Additional Comments: hx taken from pt husband as pt cannot give hx d/t dementia    Prior Function           Comments: mod Ind with walker     Hand Dominance        Extremity/Trunk Assessment   Upper Extremity Assessment Upper Extremity Assessment: Overall WFL for tasks assessed    Lower Extremity Assessment Lower Extremity Assessment: Overall WFL for tasks assessed       Communication   Communication: No difficulties  Cognition Arousal/Alertness: Awake/alert Behavior During Therapy: WFL for tasks assessed/performed  Overall Cognitive Status: History of cognitive impairments - at baseline                                 General Comments: follows one step commands consistently      General Comments      Exercises     Assessment/Plan    PT Assessment Patient needs continued PT services  PT Problem List Decreased activity  tolerance;Decreased strength       PT Treatment Interventions DME instruction;Gait training;Functional mobility training;Therapeutic activities;Therapeutic exercise;Patient/family education    PT Goals (Current goals can be found in the Care Plan section)  Acute Rehab PT Goals Patient Stated Goal: back to PLOF PT Goal Formulation: With family Time For Goal Achievement: 03/23/17 Potential to Achieve Goals: Good    Frequency Min 3X/week   Barriers to discharge        Co-evaluation               AM-PAC PT "6 Clicks" Daily Activity  Outcome Measure Difficulty turning over in bed (including adjusting bedclothes, sheets and blankets)?: Total Difficulty moving from lying on back to sitting on the side of the bed? : Total Difficulty sitting down on and standing up from a chair with arms (e.g., wheelchair, bedside commode, etc,.)?: Total Help needed moving to and from a bed to chair (including a wheelchair)?: A Little Help needed walking in hospital room?: A Little Help needed climbing 3-5 steps with a railing? : A Little 6 Click Score: 12    End of Session Equipment Utilized During Treatment: Gait belt Activity Tolerance: Patient tolerated treatment well Patient left: in chair;with call bell/phone within reach;with chair alarm set Nurse Communication: Mobility status PT Visit Diagnosis: Difficulty in walking, not elsewhere classified (R26.2)    Time: 2224-1146 PT Time Calculation (min) (ACUTE ONLY): 32 min   Charges:   PT Evaluation $PT Eval Low Complexity: 1 Procedure PT Treatments $Gait Training: 8-22 mins   PT G CodesKenyon Ana, PT Pager: [phone] 03/16/2017   Kenyon Ana 03/16/2017, 12:29 PM

## 2017-03-16 NOTE — Care Management Note (Signed)
Case Management Note  Patient Details  Name: [name] MRN: [id number] Date of Birth: 12-25-1940  Subjective/Objective: 76 y/o f admitted w/Acute encephalopathy. From home. PT-recc HHPT. Patient chose [facility] rep Kim aware of [place].                   Action/Plan:d/c home w/HHPT.   Expected Discharge Date:                  Expected Discharge Plan:  [place]  In-House Referral:     Discharge planning Services  CM Consult  Post Acute Care Choice:  Durable Medical Equipment (has cane,rw,w/c) Choice offered to:  Patient  DME Arranged:    DME Agency:     HH Arranged:  PT [place]:  [place]  Status of Service:  In process, will continue to follow  If discussed at Long Length of Stay Meetings, dates discussed:    Additional Comments:  Dessa Phi, RN 03/16/2017, 3:55 PM

## 2017-03-16 NOTE — Progress Notes (Signed)
CRITICAL VALUE ALERT  Critical Value: WBC 0.9  Date & Time Notied: 0740  Provider Notified:yes  Orders Received/Actions taken: Awaiting orders

## 2017-03-16 NOTE — Telephone Encounter (Signed)
Called pharm to cancel rx

## 2017-03-16 NOTE — Telephone Encounter (Signed)
Pharmacy aware to fill however pt is in hospital at the present time.

## 2017-03-17 ENCOUNTER — Encounter (HOSPITAL_COMMUNITY): Payer: Self-pay

## 2017-03-17 DIAGNOSIS — Z8551 Personal history of malignant neoplasm of bladder: Secondary | ICD-10-CM

## 2017-03-17 DIAGNOSIS — Z85038 Personal history of other malignant neoplasm of large intestine: Secondary | ICD-10-CM

## 2017-03-17 DIAGNOSIS — R161 Splenomegaly, not elsewhere classified: Secondary | ICD-10-CM

## 2017-03-17 DIAGNOSIS — F0390 Unspecified dementia without behavioral disturbance: Secondary | ICD-10-CM

## 2017-03-17 DIAGNOSIS — D709 Neutropenia, unspecified: Secondary | ICD-10-CM

## 2017-03-17 LAB — CBC WITH DIFFERENTIAL/PLATELET
Basophils Absolute: 0 10*3/uL (ref 0.0–0.1)
Basophils Relative: 1 %
EOS ABS: 0.1 10*3/uL (ref 0.0–0.7)
Eosinophils Relative: 4 %
HCT: 25.5 % — ABNORMAL LOW (ref 36.0–46.0)
Hemoglobin: 8.6 g/dL — ABNORMAL LOW (ref 12.0–15.0)
LYMPHS ABS: 0.5 10*3/uL — AB (ref 0.7–4.0)
LYMPHS PCT: 33 %
MCH: 28.1 pg (ref 26.0–34.0)
MCHC: 33.7 g/dL (ref 30.0–36.0)
MCV: 83.3 fL (ref 78.0–100.0)
Monocytes Absolute: 0.3 10*3/uL (ref 0.1–1.0)
Monocytes Relative: 22 %
NEUTROS ABS: 0.5 10*3/uL — AB (ref 1.7–7.7)
Neutrophils Relative %: 40 %
PLATELETS: 40 10*3/uL — AB (ref 150–400)
RBC: 3.06 MIL/uL — ABNORMAL LOW (ref 3.87–5.11)
RDW: 18.8 % — ABNORMAL HIGH (ref 11.5–15.5)
WBC: 1.4 10*3/uL — CL (ref 4.0–10.5)

## 2017-03-17 MED ORDER — IOPAMIDOL (ISOVUE-300) INJECTION 61%: 30.0000 mL | Freq: Once | INTRAVENOUS | Status: DC | PRN

## 2017-03-17 MED ORDER — FILGRASTIM 480 MCG/1.6ML IJ SOLN
480.0000 ug | Freq: Every day | INTRAMUSCULAR | Status: DC
  Administered 2017-03-17: 480 ug via SUBCUTANEOUS
  Filled 2017-03-17 (×2): qty 1.6

## 2017-03-17 MED ORDER — IOPAMIDOL (ISOVUE-300) INJECTION 61%
INTRAVENOUS | Status: AC
  Filled 2017-03-17: qty 30

## 2017-03-17 NOTE — Progress Notes (Signed)
PROGRESS NOTE    [name]  HAL:[id number] DOB: 21-Feb-1941 DOA: 03/14/2017 PCP: [name], MD   Brief Narrative: 77 y.o. female with medical history significant of nonalcoholic cirrhosis, colon and bladder cancer with urostomy came into the hospital because of weakness, cough and confusion. Patient went to her PCP the day prior to admission when levofloxacin was prescribed, unable to take it (per husband pharmacy reported interaction was medication and prescription was not filled), she worsening with more fevers so he brought her to the hospital for further evaluation. In the ED she develop fever of 101.4, CXR  showed a scarring  Assessment & Plan:   #Possible sepsis due to acute bronchitis/early pneumonia and UTI: -Lactic acid improved.  Urine c/s growing enterococcus sensitive to Levaquin. Continue Levaquin. Follow up final culture results. Monitor fever curve  #Pancytopenia in the setting of chronic hepatic disease: The pancytopenia mostly white cell count worsen to 0.9 today likely in the setting of infection.  -CBC is improving today. White blood cell count of 1.4. Continue neutropenic precaution. Plan for a dose of Neupogen today. Oncology consult appreciated. Repeat lab in the morning.  #Acute metabolic encephalopathy versus acute hepatitic encephalopathy: Ammonia level improved. Mental status also improved. Continue lactulose with bowel moment of 3-4 times a day.. PT OT and supportive care. -Mental status around baseline as per family members.  # Non-alcohol the liver cirrhosis with mild elevation in ammonia level: Continue lactulose as needed with good bowel moment of 3-4 times in 24 hours. Discussed with the family members and with the RN.  #History of colon cancer/pleura cancer: Recommended to follow-up with her oncologist outpatient.  #History of urostomy with recurrent UTI: Continue Levaquin.  #Hypokalemia: Replete potassium chloride. Serum potassium level  improved.  Possible dementia without behavioral disturbance, exact type unknown  PT OT evaluation.  Active Problems:   Colon cancer ([place])   Bladder cancer ([place])   Cirrhosis, non-alcoholic ([place])   Presence of urostomy ([place])   Acute encephalopathy   Community acquired pneumonia   Pancytopenia ([place])  DVT prophylaxis: SCD. Pancytopenia. Code Status: Full code Family Communication: Discussed with the patient's husband and daughter at bedside Disposition Plan: Currently admitted and likely discharge home tomorrow if labs improved    Consultants:   Hematology/oncology  Procedures: None Antimicrobials: Ceftriaxone and azithromycin since June 23-25 levaquin since 6/25  Subjective: Seen and examined at bedside. Patient was alert awake. Denied headache, dizziness, nausea vomiting or chest pain.  Objective: Vitals:   03/16/17 1951 03/16/17 2040 03/17/17 0456 03/17/17 0731  BP:  (!) 134/50 136/65   Pulse:  69 69   Resp:  18 18   Temp:  99.1 F (37.3 C) 98.5 F (36.9 C)   TempSrc:  Oral Oral   SpO2: 96% 96% 95% 95%  Weight:      Height:        Intake/Output Summary (Last 24 hours) at 03/17/17 1319 Last data filed at 03/17/17 0600  Gross per 24 hour  Intake              500 ml  Output              850 ml  Net             -350 ml   Filed Weights   03/14/17 0900  Weight: 111 kg (244 lb 11.4 oz)    Examination:  General exam: Lying on bed comfortable, not in distress  Respiratory system: Clear bilateral, no wheezing  Cardiovascular system: Regular rate rhythm, S1 is normal. No pedal edema.  Gastrointestinal system: Abdomen soft, nontender, nondistended. Bowel sound positive. Central nervous system: Alert awake and following commands Skin: No rashes, lesions or ulcers Psychiatry: Judgement and insight appear impaired.    Data Reviewed: I have personally reviewed following labs and imaging studies  CBC:  Recent Labs Lab 03/13/17 1149 03/14/17 0605  03/15/17 0543 03/16/17 0542 03/17/17 0644  WBC 3.9 3.1* 1.8* 0.9* 1.4*  NEUTROABS 2,886 2.4  --  0.3* 0.5*  HGB 10.1* 9.6* 9.0* 8.9* 8.6*  HCT 31.4* 28.6* 27.0* 26.5* 25.5*  MCV 86.3 83.9 83.9 82.3 83.3  PLT 44* 28* 24* 28* 40*   Basic Metabolic Panel:  Recent Labs Lab 03/13/17 1149 03/14/17 0605 03/15/17 0543 03/16/17 0542  NA 142 135 137 137  K 3.4* 3.2* 3.3* 4.2  CL 107 104 106 107  CO2 25 26 25 25   GLUCOSE 129* 144* 134* 125*  BUN 12 13 13 12   CREATININE 0.75 0.74 0.62 0.56  CALCIUM 8.9 9.2 9.0 9.0   GFR: Estimated Creatinine Clearance: 73.1 mL/min (by C-G formula based on SCr of 0.56 mg/dL). Liver Function Tests:  Recent Labs Lab 03/13/17 1149 03/14/17 0605  AST 23 37  ALT 13 19  ALKPHOS 75 70  BILITOT 2.1* 2.7*  PROT 5.2* 5.6*  ALBUMIN 3.1* 3.0*   No results for input(s): LIPASE, AMYLASE in the last 168 hours.  Recent Labs Lab 03/14/17 0606 03/16/17 0542  AMMONIA 60* <9*   Coagulation Profile:  Recent Labs Lab 03/14/17 1137  INR 1.82   Cardiac Enzymes:  Recent Labs Lab 03/14/17 0605  TROPONINI 0.03*   BNP (last 3 results) No results for input(s): PROBNP in the last 8760 hours. HbA1C: No results for input(s): HGBA1C in the last 72 hours. CBG: No results for input(s): GLUCAP in the last 168 hours. Lipid Profile: No results for input(s): CHOL, HDL, LDLCALC, TRIG, CHOLHDL, LDLDIRECT in the last 72 hours. Thyroid Function Tests: No results for input(s): TSH, T4TOTAL, FREET4, T3FREE, THYROIDAB in the last 72 hours. Anemia Panel: No results for input(s): VITAMINB12, FOLATE, FERRITIN, TIBC, IRON, RETICCTPCT in the last 72 hours. Sepsis Labs:  Recent Labs Lab 03/14/17 0109 03/14/17 0811  LATICACIDVEN 2.09* 1.37    Recent Results (from the past 240 hour(s))  CULTURE, URINE COMPREHENSIVE     Status: None   Collection Time: 03/13/17 11:00 AM  Result Value Ref Range Status   Colony Count Greater than 100,000 CFU/mL  Final   Organism  ID, Bacteria Multiple Gram Negative Rods   Final    Comment: May represent colonizers from external and internal genitalia.No further testing(including susceptibility)will be performed.    Colony Count 50,000-100,000 CFU/mL  Final   Organism ID, Bacteria Gram Positive Cocci  Final  Blood Culture (routine x 2)     Status: None (Preliminary result)   Collection Time: 03/14/17  6:05 AM  Result Value Ref Range Status   Specimen Description BLOOD BLOOD LEFT FOREARM  Final   Special Requests   Final    BOTTLES DRAWN AEROBIC AND ANAEROBIC Blood Culture adequate volume   Culture   Final    NO GROWTH 2 DAYS Performed at [facility], [place] [address], [place], [place] [address]    Report Status PENDING  Incomplete  Blood Culture (routine x 2)     Status: None (Preliminary result)   Collection Time: 03/14/17  6:06 AM  Result Value Ref Range Status  Specimen Description BLOOD LEFT HAND  Final   Special Requests   Final    BOTTLES DRAWN AEROBIC AND ANAEROBIC Blood Culture adequate volume   Culture   Final    NO GROWTH 2 DAYS Performed at [facility], [address], [place], [place] [address]    Report Status PENDING  Incomplete  Culture, Urine     Status: Abnormal   Collection Time: 03/14/17  9:28 AM  Result Value Ref Range Status   Specimen Description URINE, RANDOM  Final   Special Requests NONE  Final   Culture >=100,000 COLONIES/mL ENTEROCOCCUS FAECALIS (A)  Final   Report Status 03/16/2017 FINAL  Final   Organism ID, Bacteria ENTEROCOCCUS FAECALIS (A)  Final      Susceptibility   Enterococcus faecalis - MIC*    AMPICILLIN <=2 SENSITIVE Sensitive     LEVOFLOXACIN 2 SENSITIVE Sensitive     NITROFURANTOIN <=16 SENSITIVE Sensitive     VANCOMYCIN 1 SENSITIVE Sensitive     * >=100,000 COLONIES/mL ENTEROCOCCUS FAECALIS         Radiology Studies: No results found.      Scheduled Meds: . acidophilus  1 capsule Oral Daily  . filgrastim  480 mcg Subcutaneous  Daily  . guaiFENesin  1,200 mg Oral BID  . iopamidol      . ipratropium-albuterol  3 mL Nebulization BID  . lactulose  20 g Oral TID  . losartan  25 mg Oral Daily  . multivitamin with minerals  1 tablet Oral Daily  . pantoprazole  40 mg Oral Daily  . potassium chloride  20 mEq Oral Daily  . sodium chloride flush  3 mL Intravenous Q12H   Continuous Infusions: . levofloxacin (LEVAQUIN) IV 750 mg (03/17/17 1023)     LOS: 3 days    Ernestine Rohman Tanna Furry, MD [facility] Pager [phone]  If 7PM-7AM, please contact night-coverage [email or web address] Password [email or web address] 03/17/2017, 1:19 PM

## 2017-03-17 NOTE — Evaluation (Signed)
Occupational Therapy Evaluation Patient Details Name: [name] MRN: [id number] DOB: 04-26-1941 Today's Date: 03/17/2017    History of Present Illness [name] is a 76 y.o. female with medical history significant of remote tobacco/smoker, L TKA, dementia, nonalcoholic cirrhosis, colon and bladder cancer with urostomy came into the hospital because of weakness, cough and confusion   Clinical Impression   Education complete.  No further OT needed    Follow Up Recommendations  No OT follow up    Equipment Recommendations  None recommended by OT       Precautions / Restrictions Precautions Precautions: Fall Restrictions Weight Bearing Restrictions: No      Mobility Bed Mobility Overal bed mobility: Needs Assistance Bed Mobility: Supine to Sit     Supine to sit: Min guard     General bed mobility comments: encouragement to do herself  Transfers Overall transfer level: Needs assistance Equipment used: Rolling walker (2 wheeled) Transfers: Sit to/from [facility] Sit to Stand: Min guard Stand pivot transfers: Min guard       General transfer comment: light assist to rist and stabilize, cues for hand placement         ADL either performed or assessed with clinical judgement   ADL Overall ADL's : Needs assistance/impaired                                       General ADL Comments: Husband A pt with ADL activity as needed. No DMe needs. Safety education provided     Vision Patient Visual Report: No change from baseline              Pertinent Vitals/Pain Faces Pain Scale: Hurts little more Pain Location: right arm/IV site Pain Descriptors / Indicators: Sore Pain Intervention(s): Monitored during session           Communication Communication Communication: No difficulties   Cognition Arousal/Alertness: Awake/alert Behavior During Therapy: WFL for tasks assessed/performed Overall Cognitive Status:  History of cognitive impairments - at baseline                                                Home Living Family/patient expects to be discharged to:: Private residence Living Arrangements: Spouse/significant other Available Help at Discharge: Personal care attendant (aide 3x/wk for 4hrs per day) Type of Home: House Home Access: Ramped entrance     Home Layout: Two level;Able to live on main level with bedroom/bathroom     Bathroom Shower/Tub: Teacher, early years/pre: Standard     Home Equipment: Environmental consultant - 4 wheels;Cane - single point;Bedside commode;Shower seat;Grab bars - tub/shower;Wheelchair - manual   Additional Comments: hx taken from pt husband as pt cannot give hx d/t dementia      Prior Functioning/Environment          Comments: mod Ind with walker                 OT Goals(Current goals can be found in the care plan section) Acute Rehab OT Goals Patient Stated Goal: back to PLOF  OT Frequency:                AM-PAC PT "6 Clicks" Daily Activity     Outcome Measure Help from another person  eating meals?: None Help from another person taking care of personal grooming?: A Little Help from another person toileting, which includes using toliet, bedpan, or urinal?: A Little Help from another person bathing (including washing, rinsing, drying)?: A Little Help from another person to put on and taking off regular upper body clothing?: A Little Help from another person to put on and taking off regular lower body clothing?: A Little 6 Click Score: 19   End of Session Equipment Utilized During Treatment: Rolling walker Nurse Communication: Mobility status  Activity Tolerance: Patient tolerated treatment well Patient left: in chair;with call bell/phone within reach;with family/visitor present  OT Visit Diagnosis: Unsteadiness on feet (R26.81)                  Charges:  OT General Charges $OT Visit: 1 Procedure OT  Evaluation $OT Eval Moderate Complexity: 1 Procedure G-Codes:     Kari Baars, OT [phone]  Payton Mccallum D 03/17/2017, 11:24 AM

## 2017-03-17 NOTE — Consult Note (Signed)
Referral MD  Reason for Referral: Pancytopenia, cirrhosis/splenomegaly; history of bladder cancer and colon cancer; severe dementia   Chief Complaint  Patient presents with  . Fever  . Pneumonia  : I got sick again.   HPI: [name] is well-known to me. She is a 76 year old white female. She has a remote history of colon cancer and bladder cancer. She is cured of both.  She has worsening dementia. This is been her biggest problem. Patient has cirrhosis with splenomegaly.  She was admitted because of sepsis. She has enterococcus in her urine. She has pneumonia.  She is on antibiotics.  Her blood counts have been dropping. On 623, her white cell count was 3.1. Platelet count 28,000. On the 24th her white cell count 1.8. On 625, her white cell count 0.9. Her platelet count was 28,000. Today, her white cell count 1.4. Her platelet counts 40,000.  She is on multiple antibiotics.  She has had multiple omissions for infections.  Again, she has cirrhosis. I think her last MRI back in December showed no change in the hepatic lesions. She has splenomegaly. She has portal venous hypertension. She has a cyst in her left kidney.  There's been no bleeding.  Her appetite has been okay.     Past Medical History:  Diagnosis Date  . Anemia, iron deficiency   . Arthritis   . Bladder cancer [facility]) 2003   Bladder removed - [name]  . Cataract immature   . Cirrhosis ([place]) 08/26/2011  . Colon cancer [facility]) 2001   Hemicolectomy - [name]  . Depression   . GERD (gastroesophageal reflux disease)   . Headache(784.0)   . Hemorrhoids   . History of colon polyps   . History of DVT (deep vein thrombosis) 50+yrs ago   With pregnancy  . History of GI bleed   . History of kidney stones   . History of urostomy    Urostomy d/t hx bladder cancer  . Hx of transfusion of whole blood   . Hypertension   . Joint pain   . Memory loss    Chemotherapy; short and long term  . Osteoporosis   .  Thrombocytopenia ([place])   :  Past Surgical History:  Procedure Laterality Date  . ABDOMINAL HYSTERECTOMY    . APPENDECTOMY    . BLADDER SURGERY     bladder removed d/t cancer  . CHOLECYSTECTOMY    . COLON RESECTION     with colostomy then colostomy reversal  . COLON SURGERY  2004  . COLON SURGERY  2001  . COLONOSCOPY    . ESOPHAGOGASTRODUODENOSCOPY  02/27/2012   Procedure: ESOPHAGOGASTRODUODENOSCOPY (EGD);  Surgeon: [name], MD;  Location: [name];  Service: Endoscopy;  Laterality: N/A;  bedside case  . NEPHROLITHOTOMY Left 05/04/2014   Procedure: LEFT PERCUTANEOUS NEPHROLITHOTOMY ;  Surgeon: [name], MD;  Location: [facility];  Service: Urology;  Laterality: Left;  . TOTAL KNEE ARTHROPLASTY  2010   Left knee  :   Current Facility-Administered Medications:  .  acetaminophen (TYLENOL) tablet 650 mg, 650 mg, Oral, Q6H PRN, 650 mg at 03/16/17 1247 **OR** acetaminophen (TYLENOL) suppository 650 mg, 650 mg, Rectal, Q6H PRN, Elmahi, Mutaz, MD .  acidophilus (RISAQUAD) capsule 1 capsule, 1 capsule, Oral, Daily, Verlee Monte, MD, 1 capsule at 03/16/17 0856 .  albuterol (PROVENTIL) (2.5 MG/3ML) 0.083% nebulizer solution 2.5 mg, 2.5 mg, Nebulization, Q4H PRN, Elmahi, Mutaz, MD .  filgrastim (NEUPOGEN) injection 480 mcg, 480 mcg, Subcutaneous, Daily,  [name], MD .  guaiFENesin [facility]) 12 hr tablet 1,200 mg, 1,200 mg, Oral, BID, Verlee Monte, MD, 1,200 mg at 03/16/17 2103 .  guaiFENesin-dextromethorphan (ROBITUSSIN DM) 100-10 MG/5ML syrup 5 mL, 5 mL, Oral, Q4H PRN, Elmahi, Mutaz, MD .  ipratropium-albuterol (DUONEB) 0.5-2.5 (3) MG/3ML nebulizer solution 3 mL, 3 mL, Nebulization, BID, Elmahi, Mutaz, MD, 3 mL at 03/16/17 1951 .  lactulose (CHRONULAC) 10 GM/15ML solution 20 g, 20 g, Oral, TID, Rosita Fire, MD, Stopped at 03/16/17 [id number] .  levofloxacin (LEVAQUIN) IVPB 750 mg, 750 mg, Intravenous, Q24H, Rosita Fire, MD .  losartan (COZAAR) tablet 25 mg, 25 mg,  Oral, Daily, Elmahi, Mutaz, MD, 25 mg at 03/16/17 0856 .  multivitamin with minerals tablet 1 tablet, 1 tablet, Oral, Daily, Verlee Monte, MD, 1 tablet at 03/16/17 0857 .  ondansetron (ZOFRAN) tablet 4 mg, 4 mg, Oral, Q6H PRN **OR** ondansetron (ZOFRAN) injection 4 mg, 4 mg, Intravenous, Q6H PRN, Elmahi, Mutaz, MD .  pantoprazole (PROTONIX) EC tablet 40 mg, 40 mg, Oral, Daily, Elmahi, Mutaz, MD, 40 mg at 03/16/17 0857 .  potassium chloride SA (K-DUR,KLOR-CON) CR tablet 20 mEq, 20 mEq, Oral, Daily, Carolin Sicks, Osie Bond, MD .  sodium chloride flush (NS) 0.9 % injection 3 mL, 3 mL, Intravenous, Q12H, Elmahi, Mutaz, MD, 3 mL at 03/16/17 2200:  . acidophilus  1 capsule Oral Daily  . filgrastim  480 mcg Subcutaneous Daily  . guaiFENesin  1,200 mg Oral BID  . ipratropium-albuterol  3 mL Nebulization BID  . lactulose  20 g Oral TID  . losartan  25 mg Oral Daily  . multivitamin with minerals  1 tablet Oral Daily  . pantoprazole  40 mg Oral Daily  . potassium chloride  20 mEq Oral Daily  . sodium chloride flush  3 mL Intravenous Q12H  :  Allergies  Allergen Reactions  . Bee Venom Anaphylaxis  . Codeine Anaphylaxis and Rash  . Morphine And Related Anaphylaxis and Rash  . Phytonadione Other (See Comments)    Pt experienced an episode of cyanosis following dose of Vitamin K 10 mg IV.  Marland Kitchen Promethazine Hcl Other (See Comments)    Reaction:  Unknown   . Penicillins Rash and Other (See Comments)    Has patient had a PCN reaction causing immediate rash, facial/tongue/throat swelling, SOB or lightheadedness with hypotension: No Has patient had a PCN reaction causing severe rash involving mucus membranes or skin necrosis: No Has patient had a PCN reaction that required hospitalization: No Has patient had a PCN reaction occurring within the last 10 years: No If all of the above answers are "NO", then may proceed with Cephalosporin use.  :  Family History  Problem Relation Age of Onset  . Diabetes  Brother   . Cancer Other   . Hypertension Other   :  Social History   Social History  . Marital status: Married    Spouse name: N/A  . Number of children: N/A  . Years of education: N/A   Occupational History  . Not on file.   Social History Main Topics  . Smoking status: Former Smoker    Packs/day: 0.50    Years: 35.00    Types: Cigarettes    Start date: 10/25/1955    Quit date: 09/22/1991  . Smokeless tobacco: Never Used     Comment: quit 23years ago  . Alcohol use No  . Drug use: No  . Sexual activity: Yes    Birth control/ protection:  Surgical   Other Topics Concern  . Not on file   Social History Narrative   Lives at home with her husband, ambulatory  :  Pertinent items are noted in HPI.  Exam: Patient Vitals for the past 24 hrs:  BP Temp Temp src Pulse Resp SpO2  03/17/17 0456 136/65 98.5 F (36.9 C) Oral 69 18 95 %  03/16/17 2040 (!) 134/50 99.1 F (37.3 C) Oral 69 18 96 %  03/16/17 1951 - - - - - 96 %  03/16/17 1328 121/67 98.7 F (37.1 C) Oral 68 18 98 %  03/16/17 0741 - - - - - 98 %  03/16/17 0714 (!) 160/78 98.4 F (36.9 C) Oral 70 - 98 %    As above    Recent Labs  03/15/17 0543 03/16/17 0542  WBC 1.8* 0.9*  HGB 9.0* 8.9*  HCT 27.0* 26.5*  PLT 24* 28*    Recent Labs  03/15/17 0543 03/16/17 0542  NA 137 137  K 3.3* 4.2  CL 106 107  CO2 25 25  GLUCOSE 134* 125*  BUN 13 12  CREATININE 0.62 0.56  CALCIUM 9.0 9.0    Blood smear review:  None  Pathology: None     Assessment and Plan:  Ms. Holohan is a 76 year old white female. She has cirrhosis with splenomegaly. She has chronic thrombocytopenia and chronic leukopenia.  I think that any time that she has any type of stress on her system, she will drop her leg counts. I think infection, particularly with enterococcus, is the reason for the worsening of her blood counts. Today, her white cell count and platelets are better area  I will go ahead and give her a dose of  Neupogen. I think this would be reasonable.  I don't see that she needs a number of tests done. I suppose that she could always have myelodysplasia. However, she is not a candidate for any intervention.  She looks quite good area and again, she has no idea what the problem is. Her dementia is really bad but she is pleasant. Thankfully, her daughter was with her.  We will follow along.  Thanks for letting me know that she was admitted. It is always nice to see her. I have known her probably for 18 years.  Erica Haw, MD  [name] 16:21

## 2017-03-18 DIAGNOSIS — N390 Urinary tract infection, site not specified: Secondary | ICD-10-CM

## 2017-03-18 DIAGNOSIS — D649 Anemia, unspecified: Secondary | ICD-10-CM

## 2017-03-18 LAB — CBC WITH DIFFERENTIAL/PLATELET
BASOS ABS: 0 10*3/uL (ref 0.0–0.1)
BASOS PCT: 0 %
EOS ABS: 0.1 10*3/uL (ref 0.0–0.7)
Eosinophils Relative: 1 %
HCT: 26.9 % — ABNORMAL LOW (ref 36.0–46.0)
HEMOGLOBIN: 9 g/dL — AB (ref 12.0–15.0)
Lymphocytes Relative: 13 %
Lymphs Abs: 0.8 10*3/uL (ref 0.7–4.0)
MCH: 27.5 pg (ref 26.0–34.0)
MCHC: 33.5 g/dL (ref 30.0–36.0)
MCV: 82.3 fL (ref 78.0–100.0)
Monocytes Absolute: 0.7 10*3/uL (ref 0.1–1.0)
Monocytes Relative: 11 %
NEUTROS ABS: 4.5 10*3/uL (ref 1.7–7.7)
NEUTROS PCT: 74 %
Platelets: 39 10*3/uL — ABNORMAL LOW (ref 150–400)
RBC: 3.27 MIL/uL — AB (ref 3.87–5.11)
RDW: 18.9 % — ABNORMAL HIGH (ref 11.5–15.5)
WBC: 6.1 10*3/uL (ref 4.0–10.5)

## 2017-03-18 LAB — IRON AND TIBC
IRON: 21 ug/dL — AB (ref 28–170)
SATURATION RATIOS: 8 % — AB (ref 10.4–31.8)
TIBC: 262 ug/dL (ref 250–450)
UIBC: 241 ug/dL

## 2017-03-18 LAB — FERRITIN: FERRITIN: 31 ng/mL (ref 11–307)

## 2017-03-18 LAB — AFP TUMOR MARKER: AFP-Tumor Marker: 2.3 ng/mL (ref 0.0–8.3)

## 2017-03-18 MED ORDER — LACTULOSE 10 GM/15ML PO SOLN: 20.0000 g | Freq: Every day | ORAL | 0 refills | Status: DC | PRN

## 2017-03-18 MED ORDER — IPRATROPIUM-ALBUTEROL 0.5-2.5 (3) MG/3ML IN SOLN: 3.0000 mL | RESPIRATORY_TRACT | Status: DC | PRN

## 2017-03-18 MED ORDER — LEVOFLOXACIN 750 MG PO TABS
750.0000 mg | ORAL_TABLET | Freq: Every day | ORAL | Status: DC
  Administered 2017-03-18: 750 mg via ORAL
  Filled 2017-03-18: qty 1

## 2017-03-18 MED ORDER — LEVOFLOXACIN 750 MG PO TABS: 750.0000 mg | ORAL_TABLET | Freq: Every day | ORAL | 0 refills | Status: AC

## 2017-03-18 NOTE — Progress Notes (Signed)
PT Cancellation Note  Patient Details Name: [name] MRN: [id number] DOB: January 06, 1941   Cancelled Treatment:     Pt discharging home.    Sena Hitch 03/18/2017, 3:00 PM

## 2017-03-18 NOTE — Discharge Summary (Signed)
Physician Discharge Summary  [name]:[id number] DOB: [birth year] DOA: 03/14/2017  PCP: [name], MD  Admit date: 03/14/2017 Discharge date: 03/18/2017  Admitted From:home Disposition:home with home care services  Recommendations for Outpatient Follow-up:  1. Follow up with PCP in 1-2 weeks 2. Please obtain BMP/CBC in one week  Home Health:yes Equipment/Devices:none Discharge Condition:stable CODE STATUS:full code Diet recommendation:heart healthy  Brief/Interim Summary: 76 y.o.femalewith medical history significant of nonalcoholic cirrhosis, colon and bladder cancer with urostomy came into the hospital because of weakness, cough and confusion. Patient went to her PCP the day prior to admission when levofloxacin was prescribed, unable to take it (per husband pharmacy reported interaction was medication and prescription was not filled), she worsening with more fevers so he brought her to the hospital for further evaluation. In the ED she develop fever of 101.4, CXR  showed a scarring  #Possible sepsis due to acute bronchitis/early pneumonia and UTI: -Lactic acid improved.  Urine c/s growing enterococcus sensitive to Levaquin. Continue Levaquin For 3 more days. Patient clinically improved.  #Pancytopenia in the setting of chronic hepatic disease and acute infection in the hospital: Treated with antibiotics. Also received Neupogen. White blood cell count improved. Patient is afebrile.. Count is low but is stable. No sign of bleeding. I recommended patient to follow-up with oncologist outpatient and repeat lab next week. Discussed with the patient has patient regarding discharge planning.  #Acute metabolic encephalopathy versus acute hepatitic encephalopathy: Ammonia level improved. Mental status also improved.  -Mental status around baseline. The patient likely has underlying dementia. Discharging with oral lactulose as needed.  # Non-alcohol the liver cirrhosis with  mild elevation in ammonia level: Continue lactulose as needed. Recommended to follow up with PCP and with GI.  #History of colon cancer/pleura cancer: Recommended to follow-up with her oncologist outpatient.  #History of urostomy with recurrent UTI: Continue Levaquin for 3 more days.  #Hypokalemia:  Serum potassium level improved.  Patient is clinically improved. Labs improved. Mental status improved. Discharging with home PT and OT. Patient has good family support including her husband. Discharging home today with home care in stable condition.  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[place])   Acute encephalopathy   Community acquired pneumonia   Pancytopenia [facility])    Discharge Instructions  Discharge Instructions    Call MD for:  difficulty breathing, headache or visual disturbances    Complete by:  As directed    Call MD for:  extreme fatigue    Complete by:  As directed    Call MD for:  hives    Complete by:  As directed    Call MD for:  persistant dizziness or light-headedness    Complete by:  As directed    Call MD for:  persistant nausea and vomiting    Complete by:  As directed    Call MD for:  severe uncontrolled pain    Complete by:  As directed    Call MD for:  temperature >100.4    Complete by:  As directed    Diet - low sodium heart healthy    Complete by:  As directed    Discharge instructions    Complete by:  As directed    Please check lab (CBC, BMP) in one week with your PCP or oncologist.   Increase activity slowly  Complete by:  As directed      Allergies as of 03/18/2017      Reactions   Bee Venom Anaphylaxis   Codeine Anaphylaxis, Rash   Morphine And Related Anaphylaxis, Rash   Phytonadione Other (See Comments)   Pt experienced an episode of cyanosis following dose of  Vitamin K 10 mg IV.   Promethazine Hcl Other (See Comments)   Reaction:  Unknown    Penicillins Rash, Other (See Comments)   Has patient had a PCN reaction causing immediate rash, facial/tongue/throat swelling, SOB or lightheadedness with hypotension: No Has patient had a PCN reaction causing severe rash involving mucus membranes or skin necrosis: No Has patient had a PCN reaction that required hospitalization: No Has patient had a PCN reaction occurring within the last 10 years: No If all of the above answers are "NO", then may proceed with Cephalosporin use.      Medication List    TAKE these medications   acidophilus Caps capsule Take 1 capsule by mouth daily.   cholecalciferol 1000 units tablet Commonly known as:  VITAMIN D Take 1,000 Units by mouth daily.   Cinnamon 500 MG capsule Take 500 mg by mouth daily.   citalopram 40 MG tablet Commonly known as:  CELEXA Take 20 mg by mouth daily.   fexofenadine 180 MG tablet Commonly known as:  ALLEGRA Take 180 mg by mouth daily.   Ginkgo Biloba 60 MG Tabs Take 60 mg by mouth daily.   lactulose 10 GM/15ML solution Commonly known as:  CHRONULAC Take 30 mLs (20 g total) by mouth daily as needed for mild constipation (for constipation).   levofloxacin 750 MG tablet Commonly known as:  LEVAQUIN Take 1 tablet (750 mg total) by mouth daily. What changed:  medication strength  how much to take   losartan 25 MG tablet Commonly known as:  COZAAR Take 25 mg by mouth daily.   multivitamin with minerals Tabs tablet Take 1 tablet by mouth daily.   omega-3 acid ethyl esters 1 g capsule Commonly known as:  LOVAZA Take 1 g by mouth daily.   pantoprazole 40 MG tablet Commonly known as:  PROTONIX Take 1 tablet (40 mg total) by mouth daily.   PREVAGEN EXTRA STRENGTH 20 MG Caps Generic drug:  Apoaequorin Take 20 mg by mouth daily.   vitamin B-12 1000 MCG tablet Commonly known as:  CYANOCOBALAMIN Take 1,000 mcg by mouth  daily.   vitamin C 1000 MG tablet Take 1,000 mg by mouth daily.   vitamin E 400 UNIT capsule Take 400 Units by mouth daily.      Follow-up Information    [facility].   Why:  [place] [facility] Contact information: [address] [place] [address] [phone]        [name], MD. Schedule an appointment as soon as possible for a visit in 1 week(s).   Specialty:  Family Medicine Contact information: [address] [place] [address] [phone]        [name], MD. Schedule an appointment as soon as possible for a visit in 1 week(s).   Specialty:  Oncology Contact information: [place] [address] [phone]          Allergies  Allergen Reactions  . Bee Venom Anaphylaxis  . Codeine Anaphylaxis and Rash  . Morphine And Related Anaphylaxis and Rash  . Phytonadione Other (See Comments)  Pt experienced an episode of cyanosis following dose of Vitamin K 10 mg IV.  Marland Kitchen Promethazine Hcl Other (See Comments)    Reaction:  Unknown   . Penicillins Rash and Other (See Comments)    Has patient had a PCN reaction causing immediate rash, facial/tongue/throat swelling, SOB or lightheadedness with hypotension: No Has patient had a PCN reaction causing severe rash involving mucus membranes or skin necrosis: No Has patient had a PCN reaction that required hospitalization: No Has patient had a PCN reaction occurring within the last 10 years: No If all of the above answers are "NO", then may proceed with Cephalosporin use.    Consultations: Oncology  Procedures/Studies: None  Subjective: Seen and examined at bedside. Patient is alert awake and oriented. Denied headache, dizziness, nausea vomiting chest pain shortness of breath. No abdominal pain. Having good bowel movements.  Discharge Exam: Vitals:   03/17/17 2206 03/18/17 0556  BP: (!) 126/42 132/73  Pulse: 74  76  Resp: 18 18  Temp: 98.7 F (37.1 C) 98 F (36.7 C)   Vitals:   03/17/17 1929 03/17/17 2206 03/18/17 0556 03/18/17 0750  BP:  (!) 126/42 132/73   Pulse:  74 76   Resp:  18 18   Temp:  98.7 F (37.1 C) 98 F (36.7 C)   TempSrc:  Oral Oral   SpO2: 95% 98% 94% 95%  Weight:      Height:        General: Pt is alert, awake, not in acute distress Cardiovascular: RRR, S1/S2 +, no rubs, no gallops Respiratory: CTA bilaterally, no wheezing, no rhonchi Abdominal: Soft, NT, ND, bowel sounds + Extremities: no edema, no cyanosis    The results of significant diagnostics from this hospitalization (including imaging, microbiology, ancillary and laboratory) are listed below for reference.     Microbiology: Recent Results (from the past 240 hour(s))  CULTURE, URINE COMPREHENSIVE     Status: None   Collection Time: 03/13/17 11:00 AM  Result Value Ref Range Status   Colony Count Greater than 100,000 CFU/mL  Final   Organism ID, Bacteria Multiple Gram Negative Rods   Final    Comment: May represent colonizers from external and internal genitalia.No further testing(including susceptibility)will be performed.    Colony Count 50,000-100,000 CFU/mL  Final   Organism ID, Bacteria Gram Positive Cocci  Final  Blood Culture (routine x 2)     Status: None (Preliminary result)   Collection Time: 03/14/17  6:05 AM  Result Value Ref Range Status   Specimen Description BLOOD BLOOD LEFT FOREARM  Final   Special Requests   Final    BOTTLES DRAWN AEROBIC AND ANAEROBIC Blood Culture adequate volume   Culture   Final    NO GROWTH 3 DAYS Performed at [facility], [address] [place], [place] [address]    Report Status PENDING  Incomplete  Blood Culture (routine x 2)     Status: None (Preliminary result)   Collection Time: 03/14/17  6:06 AM  Result Value Ref Range Status   Specimen Description BLOOD LEFT HAND  Final   Special Requests   Final    BOTTLES DRAWN AEROBIC AND ANAEROBIC  Blood Culture adequate volume   Culture   Final    NO GROWTH 3 DAYS Performed at [facility], [place] [address] [place], [place] [address]    Report Status PENDING  Incomplete  Culture, Urine     Status: Abnormal   Collection Time: 03/14/17  9:28 AM  Result Value Ref Range Status   Specimen Description URINE, RANDOM  Final   Special Requests NONE  Final   Culture >=100,000 COLONIES/mL ENTEROCOCCUS FAECALIS (A)  Final   Report Status 03/16/2017 FINAL  Final   Organism ID, Bacteria ENTEROCOCCUS FAECALIS (A)  Final      Susceptibility   Enterococcus faecalis - MIC*    AMPICILLIN <=2 SENSITIVE Sensitive     LEVOFLOXACIN 2 SENSITIVE Sensitive     NITROFURANTOIN <=16 SENSITIVE Sensitive     VANCOMYCIN 1 SENSITIVE Sensitive     * >=100,000 COLONIES/mL ENTEROCOCCUS FAECALIS     Labs: BNP (last 3 results) No results for input(s): BNP in the last 8760 hours. Basic Metabolic Panel:  Recent Labs Lab 03/13/17 1149 03/14/17 0605 03/15/17 0543 03/16/17 0542  NA 142 135 137 137  K 3.4* 3.2* 3.3* 4.2  CL 107 104 106 107  CO2 25 26 25 25   GLUCOSE 129* 144* 134* 125*  BUN 12 13 13 12   CREATININE 0.75 0.74 0.62 0.56  CALCIUM 8.9 9.2 9.0 9.0   Liver Function Tests:  Recent Labs Lab 03/13/17 1149 03/14/17 0605  AST 23 37  ALT 13 19  ALKPHOS 75 70  BILITOT 2.1* 2.7*  PROT 5.2* 5.6*  ALBUMIN 3.1* 3.0*   No results for input(s): LIPASE, AMYLASE in the last 168 hours.  Recent Labs Lab 03/14/17 0606 03/16/17 0542  AMMONIA 60* <9*   CBC:  Recent Labs Lab 03/13/17 1149 03/14/17 8182 03/15/17 0543 03/16/17 0542 03/17/17 0644 03/18/17 0714  WBC 3.9 3.1* 1.8* 0.9* 1.4* 6.1  NEUTROABS 2,886 2.4  --  0.3* 0.5* 4.5  HGB 10.1* 9.6* 9.0* 8.9* 8.6* 9.0*  HCT 31.4* 28.6* 27.0* 26.5* 25.5* 26.9*  MCV 86.3 83.9 83.9 82.3 83.3 82.3  PLT 44* 28* 24* 28* 40* 39*   Cardiac Enzymes:  Recent Labs Lab 03/14/17 0605  TROPONINI 0.03*   BNP: Invalid input(s):  POCBNP CBG: No results for input(s): GLUCAP in the last 168 hours. D-Dimer No results for input(s): DDIMER in the last 72 hours. Hgb A1c No results for input(s): HGBA1C in the last 72 hours. Lipid Profile No results for input(s): CHOL, HDL, LDLCALC, TRIG, CHOLHDL, LDLDIRECT in the last 72 hours. Thyroid function studies No results for input(s): TSH, T4TOTAL, T3FREE, THYROIDAB in the last 72 hours.  Invalid input(s): FREET3 Anemia work up No results for input(s): VITAMINB12, FOLATE, FERRITIN, TIBC, IRON, RETICCTPCT in the last 72 hours. Urinalysis    Component Value Date/Time   COLORURINE AMBER (A) 03/14/2017 0754   APPEARANCEUR TURBID (A) 03/14/2017 0754   LABSPEC 1.013 03/14/2017 0754   PHURINE 6.0 03/14/2017 0754   GLUCOSEU NEGATIVE 03/14/2017 0754   HGBUR MODERATE (A) 03/14/2017 0754   BILIRUBINUR NEGATIVE 03/14/2017 0754   KETONESUR NEGATIVE 03/14/2017 0754   PROTEINUR 30 (A) 03/14/2017 0754   UROBILINOGEN 1.0 04/06/2014 1856   NITRITE POSITIVE (A) 03/14/2017 0754   LEUKOCYTESUR LARGE (A) 03/14/2017 0754   Sepsis Labs Invalid input(s): PROCALCITONIN,  WBC,  LACTICIDVEN Microbiology Recent Results (from the past 240 hour(s))  CULTURE, URINE COMPREHENSIVE     Status: None   Collection Time: 03/13/17 11:00 AM  Result Value Ref Range Status   Colony Count Greater than 100,000 CFU/mL  Final   Organism ID, Bacteria Multiple Gram Negative Rods   Final    Comment: May represent colonizers from external and internal genitalia.No further testing(including susceptibility)will be performed.    Colony Count 50,000-100,000 CFU/mL  Final  Organism ID, Bacteria Gram Positive Cocci  Final  Blood Culture (routine x 2)     Status: None (Preliminary result)   Collection Time: 03/14/17  6:05 AM  Result Value Ref Range Status   Specimen Description BLOOD BLOOD LEFT FOREARM  Final   Special Requests   Final    BOTTLES DRAWN AEROBIC AND ANAEROBIC Blood Culture adequate volume    Culture   Final    NO GROWTH 3 DAYS Performed at [facility], [address], [place], [place] [address]    Report Status PENDING  Incomplete  Blood Culture (routine x 2)     Status: None (Preliminary result)   Collection Time: 03/14/17  6:06 AM  Result Value Ref Range Status   Specimen Description BLOOD LEFT HAND  Final   Special Requests   Final    BOTTLES DRAWN AEROBIC AND ANAEROBIC Blood Culture adequate volume   Culture   Final    NO GROWTH 3 DAYS Performed at [facility], [place] [address] [place], [place] [address]    Report Status PENDING  Incomplete  Culture, Urine     Status: Abnormal   Collection Time: 03/14/17  9:28 AM  Result Value Ref Range Status   Specimen Description URINE, RANDOM  Final   Special Requests NONE  Final   Culture >=100,000 COLONIES/mL ENTEROCOCCUS FAECALIS (A)  Final   Report Status 03/16/2017 FINAL  Final   Organism ID, Bacteria ENTEROCOCCUS FAECALIS (A)  Final      Susceptibility   Enterococcus faecalis - MIC*    AMPICILLIN <=2 SENSITIVE Sensitive     LEVOFLOXACIN 2 SENSITIVE Sensitive     NITROFURANTOIN <=16 SENSITIVE Sensitive     VANCOMYCIN 1 SENSITIVE Sensitive     * >=100,000 COLONIES/mL ENTEROCOCCUS FAECALIS     Time coordinating discharge: 32 minutes  SIGNED:   Rosita Fire, MD  [facility] 03/18/2017, 10:25 AM  If 7PM-7AM, please contact night-coverage [email or web address] Password [email or web address]

## 2017-03-18 NOTE — Care Management Important Message (Signed)
Important Message  Patient Details  Name: LARCENIA HOLADAY MRN: [id number] Date of Birth: September 07, 1941   Medicare Important Message Given:  Yes    Kerin Salen 03/18/2017, 12:01 [place] Message  Patient Details  Name: SERINITY WARE MRN: [id number] Date of Birth: 1940/12/26   Medicare Important Message Given:  Yes    Kerin Salen 03/18/2017, 12:01 PM

## 2017-03-18 NOTE — Care Management Note (Signed)
Case Management Note  Patient Details  Name: [name] MRN: [id number] Date of Birth: [birth year]  Subjective/Objective:    [facility] rep Kim aware of HHPT,& d/c.                 Action/Plan:d/c home w/HHC.   Expected Discharge Date:  03/18/17               Expected Discharge Plan:  [place]  In-House Referral:     Discharge planning Services  CM Consult  Post Acute Care Choice:  Durable Medical Equipment (has cane,rw,w/c) Choice offered to:  Patient  DME Arranged:    DME Agency:     HH Arranged:  PT [place]:  [place]  Status of Service:  Completed, signed off  If discussed at [place] of Stay Meetings, dates discussed:    Additional Comments:  Dessa Phi, RN 03/18/2017, 11:09 AM

## 2017-03-18 NOTE — Progress Notes (Signed)
PHARMACIST - PHYSICIAN COMMUNICATION DR:   [facility] CONCERNING: Antibiotic IV to Oral Route Change Policy  RECOMMENDATION: This patient is receiving levofloxacin by the intravenous route.  Based on criteria approved by the Pharmacy and Therapeutics Committee, the antibiotic(s) is/are being converted to the equivalent oral dose form(s).   DESCRIPTION: These criteria include:  Patient being treated for a respiratory tract infection, urinary tract infection, cellulitis or clostridium difficile associated diarrhea if on metronidazole  The patient is not neutropenic and does not exhibit a GI malabsorption state  The patient is eating (either orally or via tube) and/or has been taking other orally administered medications for a least 24 hours  The patient is improving clinically and has a Tmax < 100.5  If you have questions about this conversion, please contact the Pharmacy Department  []   [phone] )  [facility] []   [phone] )  [facility] []   [phone] )  [facility] []   [phone] )  [facility] [x]   [phone] )  [place], PharmD, BCPS.   Pager: [phone] 03/18/2017 11:22 AM

## 2017-03-18 NOTE — Progress Notes (Signed)
[name] is doing better. The antibiotics seem to be helping the enterococcus.  She is on some Neupogen. There are no labs yet back today. However, her white cell count yesterday was 1.4. Her platelet count was 40,000.  There is no fever. She is eating better.  It is not surprising to me that this is happening. Given her cirrhosis and splenomegaly, anytime that she has an infection, her blood counts will trend downward as her bone marrow probably gets suppressed. I am sure that she has some sequestration.  We will have to see what her blood counts are today.  On her physical exam, her temperature is 90.8. Pulse is 76. Blood pressure 132/73. Her lungs are clear. Cardiac exam regular rate and rhythm. Abdomen is soft. She is slightly obese. There is no hepatomegaly. Her spleen tip is palpable just below the left costal margin.  Again we will see what her blood counts are today. She is still on Neupogen. If her white cell count is above 2000, then the Neupogen can be stopped.  I noticed that she is quite anemic. I don't think she is bleeding. This will have to be watched. Her MCV is on the low side. I'm sure she probably is iron deficient. She has been iron deficient before.  Lattie Haw, MD  Darlyn Chamber 33:6

## 2017-03-19 ENCOUNTER — Telehealth: Payer: Self-pay | Admitting: Family Medicine

## 2017-03-19 LAB — CULTURE, BLOOD (ROUTINE X 2)
CULTURE: NO GROWTH
Culture: NO GROWTH
SPECIAL REQUESTS: ADEQUATE
Special Requests: ADEQUATE

## 2017-03-19 NOTE — Telephone Encounter (Signed)
Called and spoke to pt's husband and pt is doing well, better then yesterday with no questions about medications, appts or recent hospital stay. Scheduled f/u hosp appt for 03/31/17 as that is the soonest he could get her in to be seen. She does have an appt with Dr. [name] on 03/26/17.

## 2017-03-19 NOTE — Progress Notes (Signed)
03/19/2017 Discussed with the pharmacist to dc levaquin and changed to augmentin 875 mg bid for 3 days.  Pharmacy: [phone]

## 2017-03-23 ENCOUNTER — Other Ambulatory Visit: Payer: Self-pay | Admitting: *Deleted

## 2017-03-23 DIAGNOSIS — D509 Iron deficiency anemia, unspecified: Secondary | ICD-10-CM

## 2017-03-23 DIAGNOSIS — C189 Malignant neoplasm of colon, unspecified: Secondary | ICD-10-CM

## 2017-03-24 ENCOUNTER — Other Ambulatory Visit: Payer: Self-pay | Admitting: Hematology & Oncology

## 2017-03-24 ENCOUNTER — Ambulatory Visit (HOSPITAL_BASED_OUTPATIENT_CLINIC_OR_DEPARTMENT_OTHER): Payer: Medicare Other | Admitting: Hematology & Oncology

## 2017-03-24 ENCOUNTER — Other Ambulatory Visit (HOSPITAL_BASED_OUTPATIENT_CLINIC_OR_DEPARTMENT_OTHER): Payer: Medicare Other

## 2017-03-24 VITALS — BP 150/65 | HR 71 | Temp 98.3°F | Resp 17 | Wt 244.0 lb

## 2017-03-24 DIAGNOSIS — D696 Thrombocytopenia, unspecified: Secondary | ICD-10-CM

## 2017-03-24 DIAGNOSIS — D72819 Decreased white blood cell count, unspecified: Secondary | ICD-10-CM | POA: Diagnosis not present

## 2017-03-24 DIAGNOSIS — D509 Iron deficiency anemia, unspecified: Secondary | ICD-10-CM

## 2017-03-24 DIAGNOSIS — D500 Iron deficiency anemia secondary to blood loss (chronic): Secondary | ICD-10-CM

## 2017-03-24 DIAGNOSIS — R609 Edema, unspecified: Secondary | ICD-10-CM

## 2017-03-24 DIAGNOSIS — K7460 Unspecified cirrhosis of liver: Secondary | ICD-10-CM

## 2017-03-24 DIAGNOSIS — C189 Malignant neoplasm of colon, unspecified: Secondary | ICD-10-CM

## 2017-03-24 LAB — CBC WITH DIFFERENTIAL (CANCER CENTER ONLY)
BASO#: 0 10*3/uL (ref 0.0–0.2)
BASO%: 1 % (ref 0.0–2.0)
EOS%: 0.6 % (ref 0.0–7.0)
Eosinophils Absolute: 0 10*3/uL (ref 0.0–0.5)
HCT: 31 % — ABNORMAL LOW (ref 34.8–46.6)
HEMOGLOBIN: 10.2 g/dL — AB (ref 11.6–15.9)
LYMPH#: 0.4 10*3/uL — ABNORMAL LOW (ref 0.9–3.3)
LYMPH%: 12.9 % — ABNORMAL LOW (ref 14.0–48.0)
MCH: 28.2 pg (ref 26.0–34.0)
MCHC: 32.9 g/dL (ref 32.0–36.0)
MCV: 86 fL (ref 81–101)
MONO#: 0.7 10*3/uL (ref 0.1–0.9)
MONO%: 20.9 % — AB (ref 0.0–13.0)
NEUT%: 64.6 % (ref 39.6–80.0)
NEUTROS ABS: 2 10*3/uL (ref 1.5–6.5)
Platelets: 71 10*3/uL — ABNORMAL LOW (ref 145–400)
RBC: 3.62 10*6/uL — AB (ref 3.70–5.32)
RDW: 17.6 % — ABNORMAL HIGH (ref 11.1–15.7)
WBC: 3.1 10*3/uL — AB (ref 3.9–10.0)

## 2017-03-24 LAB — FERRITIN: FERRITIN: 30 ng/mL (ref 9–269)

## 2017-03-24 LAB — IRON AND TIBC
%SAT: 34 % (ref 21–57)
IRON: 102 ug/dL (ref 41–142)
TIBC: 304 ug/dL (ref 236–444)
UIBC: 202 ug/dL (ref 120–384)

## 2017-03-24 LAB — TECHNOLOGIST REVIEW CHCC SATELLITE

## 2017-03-24 MED ORDER — SPIRONOLACTONE 50 MG PO TABS: 50.0000 mg | ORAL_TABLET | Freq: Every day | ORAL | 1 refills | Status: DC

## 2017-03-24 NOTE — Progress Notes (Signed)
Hematology and Oncology Follow Up Visit  DEISI SALONGA [id number] Oct 10, 1940 76 y.o. 03/24/2017   Principle Diagnosis:  1. Recurrent iron-deficiency anemia. 2. 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 3. Remote history of stage II (T3, N0, M0) adenocarcinoma of colon. 4. 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     Interim History:  Ms.  Buttler is back for followup. She was recently hospitalized for pneumonia. This really is no surprise. When she has pneumonia or any infection, her ammonia level will go up. Apparently she had some confusion. This is because of an elevated ammonia level. She is on lactulose.  She was quite thrombocytopenic. She also was a little bit leukopenic. This all improved. I did give her some Neupogen in the hospital because of her pneumonia.  She got iron in the hospital. Her ferritin was 31 with iron saturation of 8%.  She sees her family doctor next week.  Her dementia clearly is her biggest problem. This is been the case for several years.  She, thankfully, has not had any bleeding problems. There's been no rashes.   Overall, her performance status is ECOG 2-3   Medications:  Current Outpatient Prescriptions:  .  acidophilus (RISAQUAD) CAPS capsule, Take 1 capsule by mouth daily., Disp: , Rfl:  .  amoxicillin-clavulanate (AUGMENTIN) 875-125 MG tablet, TK 1 T PO BID FOR 3 DAYS, Disp: , Rfl: 0 .  Apoaequorin (PREVAGEN EXTRA STRENGTH) 20 MG CAPS, Take 20 mg by mouth daily., Disp: , Rfl:  .  Ascorbic Acid (VITAMIN C) 1000 MG tablet, Take 1,000 mg by mouth daily., Disp: , Rfl:  .  cholecalciferol (VITAMIN D) 1000 units tablet, Take 1,000 Units by mouth daily., Disp: , Rfl:  .  Cinnamon 500 MG capsule, Take 500 mg by mouth daily. , Disp: , Rfl:  .  citalopram (CELEXA) 40 MG tablet, Take 20 mg by mouth daily., Disp: , Rfl:  .  fexofenadine  (ALLEGRA) 180 MG tablet, Take 180 mg by mouth daily., Disp: , Rfl:  .  GENERLAC 10 GM/15ML SOLN, TK 30 MLS PO QD PRN FOR MILD CONSTIPATION, Disp: , Rfl: 0 .  Ginkgo Biloba 60 MG TABS, Take 60 mg by mouth daily. , Disp: , Rfl:  .  lactulose (CHRONULAC) 10 GM/15ML solution, Take 30 mLs (20 g total) by mouth daily as needed for mild constipation (for constipation)., Disp: 240 mL, Rfl: 0 .  losartan (COZAAR) 25 MG tablet, Take 25 mg by mouth daily., Disp: , Rfl:  .  Multiple Vitamin (MULTIVITAMIN WITH MINERALS) TABS tablet, Take 1 tablet by mouth daily., Disp: , Rfl:  .  omega-3 acid ethyl esters (LOVAZA) 1 g capsule, Take 1 g by mouth daily., Disp: , Rfl:  .  pantoprazole (PROTONIX) 40 MG tablet, Take 1 tablet (40 mg total) by mouth daily., Disp: 30 tablet, Rfl: 11 .  spironolactone (ALDACTONE) 50 MG tablet, Take 1 tablet (50 mg total) by mouth daily., Disp: 30 tablet, Rfl: 1 .  vitamin B-12 (CYANOCOBALAMIN) 1000 MCG tablet, Take 1,000 mcg by mouth daily., Disp: , Rfl:  .  vitamin E 400 UNIT capsule, Take 400 Units by mouth daily., Disp: , Rfl:   Allergies:  Allergies  Allergen Reactions  . Bee Venom Anaphylaxis  . Codeine Anaphylaxis and Rash  . Morphine And Related Anaphylaxis and Rash  . Phytonadione Other (See Comments)    Pt experienced  an episode of cyanosis following dose of Vitamin K 10 mg IV.  Marland Kitchen Promethazine Hcl Other (See Comments)    Reaction:  Unknown   . Penicillins Rash and Other (See Comments)    Has patient had a PCN reaction causing immediate rash, facial/tongue/throat swelling, SOB or lightheadedness with hypotension: No Has patient had a PCN reaction causing severe rash involving mucus membranes or skin necrosis: No Has patient had a PCN reaction that required hospitalization: No Has patient had a PCN reaction occurring within the last 10 years: No If all of the above answers are "NO", then may proceed with Cephalosporin use.    Past Medical History, Surgical history,  Social history, and Family History were reviewed and updated.  Review of Systems: As above  Physical Exam:  weight is 244 lb (110.7 kg). Her oral temperature is 98.3 F (36.8 C). Her blood pressure is 150/65 (abnormal) and her pulse is 71. Her respiration is 17 and oxygen saturation is 98%.   Lungs are clear. Cardiac exam regular rate and rhythm. She has no murmurs rubs or bruits. Abdomen is soft. She has good bowel sounds. There is no fluid wave. There is a palpable liver edge. Her spleen tip is palpable with inspiration under the left costal margin. Extremities shows no clubbing, cyanosis or edema. Back exam shows no kyphosis. There is no tenderness over the spine, ribs or hips. Skin exam shows no rashes, ecchymoses or petechia. She has some seborrheic keratoses. Neurological exam shows no focal neurological deficits. She does have the dementia which is chronic.  Lab Results  Component Value Date   WBC 3.1 (L) 03/24/2017   HGB 10.2 (L) 03/24/2017   HCT 31.0 (L) 03/24/2017   MCV 86 03/24/2017   PLT 71 (L) 03/24/2017     Chemistry      Component Value Date/Time   NA 137 03/16/2017 0542   NA 143 07/01/2010 0949   K 4.2 03/16/2017 0542   K 4.2 07/01/2010 0949   CL 107 03/16/2017 0542   CL 99 07/01/2010 0949   CO2 25 03/16/2017 0542   CO2 33 07/01/2010 0949   BUN 12 03/16/2017 0542   BUN 13 07/01/2010 0949   CREATININE 0.56 03/16/2017 0542   CREATININE 0.75 03/13/2017 1149      Component Value Date/Time   CALCIUM 9.0 03/16/2017 0542   CALCIUM 9.4 07/01/2010 0949   ALKPHOS 70 03/14/2017 0605   ALKPHOS 87 (H) 07/01/2010 0949   AST 37 03/14/2017 0605   AST 24 07/01/2010 0949   ALT 19 03/14/2017 0605   ALT 19 07/01/2010 0949   BILITOT 2.7 (H) 03/14/2017 0605   BILITOT 1.60 07/01/2010 0949         Impression and Plan: Ms. Lyman is 76 year old female. She has iron deficiency anemia. I did look at her blo-yod smear. She does have some increase in microcytic red cells. I  do not see any nuclear red cells. As such, I highly suspect that she is iron deficient again. I think last time she had iron was probably 6 months ago.  I will try her on some Aldactone. This may help with some of the edema that she has.  I'm glad that her platelet count is doing better. I think that with any stress that occurs with her, her blackout will go down.  She has appointment to see [place] in 6 weeks. We will keep that appointment.   [name], MD 7/3/201812:47 PM

## 2017-03-26 DIAGNOSIS — N200 Calculus of kidney: Secondary | ICD-10-CM | POA: Diagnosis not present

## 2017-03-26 DIAGNOSIS — D3002 Benign neoplasm of left kidney: Secondary | ICD-10-CM | POA: Diagnosis not present

## 2017-03-26 DIAGNOSIS — Z8551 Personal history of malignant neoplasm of bladder: Secondary | ICD-10-CM | POA: Diagnosis not present

## 2017-03-28 ENCOUNTER — Ambulatory Visit (HOSPITAL_COMMUNITY)
Admission: EM | Admit: 2017-03-28 | Discharge: 2017-03-28 | Disposition: A | Discharge status: Home / Self Care | Payer: Medicare Other | Attending: Family Medicine | Admitting: Family Medicine

## 2017-03-28 ENCOUNTER — Encounter (HOSPITAL_COMMUNITY): Payer: Self-pay | Admitting: Emergency Medicine

## 2017-03-28 DIAGNOSIS — R05 Cough: Secondary | ICD-10-CM | POA: Diagnosis not present

## 2017-03-28 DIAGNOSIS — R059 Cough, unspecified: Secondary | ICD-10-CM

## 2017-03-28 MED ORDER — BENZONATATE 100 MG PO CAPS: 100.0000 mg | ORAL_CAPSULE | Freq: Three times a day (TID) | ORAL | 0 refills | Status: DC

## 2017-03-28 NOTE — Discharge Instructions (Signed)
Today you were diagnosed with the following: 1. Cough   Coughing may persist for quite awhile while recovering from pneumonia.  You have been prescribed prescription medications this visit.   Meds ordered this encounter  Medications   benzonatate (TESSALON) 100 MG capsule    Sig: Take 1 capsule (100 mg total) by mouth every 8 (eight) hours.    Dispense:  21 capsule    Refill:  0   Please take all medications as directed.  Keep your scheduled appointment with you primary care doctor.  If you are not improving over the next few days or feel you are worsening please follow up here or the Emergency Department if you are unable to see your regular doctor.

## 2017-03-28 NOTE — ED Provider Notes (Signed)
  [facility]   [id number] 03/28/17 Arrival Time: 38  ASSESSMENT & PLAN:  Today you were diagnosed with the following: 1. Cough   Coughing may persist for quite awhile while recovering from pneumonia.  You have been prescribed prescription medications this visit.   Meds ordered this encounter  Medications  . benzonatate (TESSALON) 100 MG capsule    Sig: Take 1 capsule (100 mg total) by mouth every 8 (eight) hours.    Dispense:  21 capsule    Refill:  0   Please take all medications as directed.  Keep your scheduled appointment with you primary care doctor.  If you are not improving over the next few days or feel you are worsening please follow up here or the Emergency Department if you are unable to see your regular doctor.  Will keep upcoming hospital f/u appt with PCP. Reviewed expectations re: course of current medical issues. Questions answered. Outlined signs and symptoms indicating need for more acute intervention. Patient verbalized understanding. After Visit Summary given.   SUBJECTIVE:  [name] is a 76 y.o. female who was recently discharged from hospital with pneumonia approx 2 weeks ago. Discharge summary reviewed. She reports persistent coughing since discharge. Affecting sleep. Afebrile. No CP. No wheezing reported. No specific aggravating or alleviating factors reported. Tolerating PO intake well. Ambulatory. Taking medications as directed. OTC treatment without relief.  ROS: As per HPI. All other systems negative.   OBJECTIVE:  Vitals:   03/28/17 1801  BP: (!) 134/47  Pulse: 69  Resp: (!) 24  Temp: 98.8 F (37.1 C)  TempSrc: Oral  SpO2: 97%   Recheck RR: 18   General appearance: alert; no distress HEENT: oropharynx normal Heart: regular Lungs: moving air fairly well but decreased breath sounds on R (recent CXR shows parenchymal scarring on R mid lung); no respiratory distress Extremities: extremities normal, atraumatic, no  cyanosis MSK: symmetrical with no gross deformities Skin: warm and dry Psychological:  alert and cooperative; normal mood and affect   Allergies  Allergen Reactions  . Bee Venom Anaphylaxis  . Codeine Anaphylaxis and Rash  . Morphine And Related Anaphylaxis and Rash  . Phytonadione Other (See Comments)    Pt experienced an episode of cyanosis following dose of Vitamin K 10 mg IV.  Marland Kitchen Promethazine Hcl Other (See Comments)    Reaction:  Unknown   . Penicillins Rash and Other (See Comments)    Has patient had a PCN reaction causing immediate rash, facial/tongue/throat swelling, SOB or lightheadedness with hypotension: No Has patient had a PCN reaction causing severe rash involving mucus membranes or skin necrosis: No Has patient had a PCN reaction that required hospitalization: No Has patient had a PCN reaction occurring within the last 10 years: No If all of the above answers are "NO", then may proceed with Cephalosporin use.    PMHx, SurgHx, SocialHx, Medications, and Allergies were reviewed in the Visit Navigator and updated as appropriate.      Vanessa Kick, MD 03/29/17 [phone]

## 2017-03-28 NOTE — ED Triage Notes (Signed)
Coughing for 2 weeks, but particularly bad today.. Patient had pneumonia 2 weeks ago, patient did follow up with dr Jonette Eva this past tuesday

## 2017-03-31 ENCOUNTER — Encounter: Payer: Self-pay | Admitting: Family Medicine

## 2017-03-31 ENCOUNTER — Ambulatory Visit (INDEPENDENT_AMBULATORY_CARE_PROVIDER_SITE_OTHER): Payer: Medicare Other | Admitting: Family Medicine

## 2017-03-31 VITALS — BP 120/60 | HR 84 | Temp 98.3°F | Resp 20 | Ht 65.0 in | Wt 232.0 lb

## 2017-03-31 DIAGNOSIS — Z09 Encounter for follow-up examination after completed treatment for conditions other than malignant neoplasm: Secondary | ICD-10-CM | POA: Diagnosis not present

## 2017-03-31 DIAGNOSIS — J1289 Other viral pneumonia: Secondary | ICD-10-CM | POA: Diagnosis not present

## 2017-03-31 MED ORDER — AZITHROMYCIN 250 MG PO TABS: ORAL_TABLET | ORAL | 0 refills | Status: DC

## 2017-03-31 MED ORDER — CEFUROXIME AXETIL 500 MG PO TABS: 500.0000 mg | ORAL_TABLET | Freq: Two times a day (BID) | ORAL | 0 refills | Status: DC

## 2017-03-31 NOTE — Progress Notes (Signed)
Subjective:    Patient ID: [name], female    DOB: September 20, 1941, 76 y.o.   MRN: [id number]  HPI  03/13/17 The patient presents with her husband with a 48 hour history of fever to 101-102, occasional cough, fatigue, lethargy. Patient has a history of bladder cancer and had her bladder removed. She has an external urostomy. The urine in her collection bag has been reddish brown dark and foul-smelling as well.   Urinalysis will likely show only contamination given the fact it is a collection bag that is chronic. Exam is significant for faint posterior basilar crackles in the right lower lung.  At that time, my plan was: Given the fact the patient has a urostomy bag, I doubt that a urinalysis will be of much benefit. Grossly the urine appears abnormal as it is dark and slightly tea-colored.  I will send a sample for a urine culture. I will also check the patient for pneumonia with a chest x-ray although my leading suspicion based on her exam is that she has developed a community-acquired pneumonia. Begin the patient on Levaquin 500 milligrams by mouth daily for 7 days. This should also cover for urinary tract infections  03/31/17 Apparently, the pharmacy would not give her the Levaquin due to concern about interaction with Levaquin and citalopram. They called my office and I approved her to take Levaquin but for whatever reason the patient never got the antibiotic. She says really presented to the hospital the next day and was diagnosed with community-acquired pneumonia as well as enterococcus urinary tract infection and was started back on Levaquin. I have copied relevant portions of the discharge summary and included them below for my reference:  Admit date: 03/14/2017 Discharge date: 03/18/2017  Admitted From:home Disposition:home with home care services  Recommendations for Outpatient Follow-up:  1. Follow up with PCP in 1-2 weeks 2. Please obtain BMP/CBC in one week  Home  Health:yes Equipment/Devices:none Discharge Condition:stable CODE STATUS:full code Diet recommendation:heart healthy  Brief/Interim Summary: 77 y.o.femalewith medical history significant of nonalcoholic cirrhosis, colon and bladder cancer with urostomy came into the hospital because of weakness, cough and confusion. Patient went to her PCP the day prior to admission when levofloxacin was prescribed, unable to take it (per husband pharmacy reported interaction was medication and prescription was not filled), she worsening with more fevers so he brought her to the hospital for further evaluation. In the ED she develop fever of 101.4, CXR showed a scarring  #Possible sepsis due to acute bronchitis/early pneumonia and UTI: -Lactic acid improved. Urine c/s growing enterococcus sensitive to Levaquin. Continue Levaquin For 3 more days. Patient clinically improved.  #Pancytopenia in the setting of chronic hepatic disease and acute infection in the hospital: Treated with antibiotics. Also received Neupogen. White blood cell count improved. Patient is afebrile.. Count is low but is stable. No sign of bleeding. I recommended patient to follow-up with oncologist outpatient and repeat lab next week. Discussed with the patient has patient regarding discharge planning.  #Acute metabolic encephalopathy versus acute hepatitic encephalopathy: Ammonia level improved. Mental status also improved. -Mental status around baseline. The patient likely has underlying dementia. Discharging with oral lactulose as needed.  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 Recommended to follow up with PCP and with GI.  #History of colon cancer/pleura cancer: Recommended to follow-up with her oncologist outpatient.  #History of urostomy with recurrent UTI: Continue Levaquin for 3 more days.  #Hypokalemia:  Serum  potassium level improved  03/31/17 The  patient is here today for follow-up. However she is coughing constantly throughout our exam. Husband states that this cough began after she left the hospital. It has been steadily worsening. She is having a difficult time breaking up the mucus and coughing up the congestion.  Denies pleurisy. Denies hemoptysis. Denies purulent sputum. However pulmonary exam today is worse from when I saw her last. She has worsening bibasilar crackles. She has rales throughout. There is no expiratory wheezing.  Husband denies fevers. Since completing Levaquin, the patient has defervesced. Since discharge from the hospital, she was started on spironolactone by Dr. [name] due to cirrhosis.     Past Medical History:  Diagnosis Date  . Anemia, iron deficiency   . Arthritis   . Bladder cancer [facility]) 2003   Bladder removed - [name]  . Cataract immature   . Cirrhosis ([facility]) 08/26/2011  . Colon cancer [facility]) 2001   Hemicolectomy - [name]  . Depression   . GERD (gastroesophageal reflux disease)   . Headache(784.0)   . Hemorrhoids   . History of colon polyps   . History of DVT (deep vein thrombosis) 50+yrs ago   With pregnancy  . History of GI bleed   . History of kidney stones   . History of urostomy    Urostomy d/t hx bladder cancer  . Hx of transfusion of whole blood   . Hypertension   . Joint pain   . Memory loss    Chemotherapy; short and long term  . Osteoporosis   . Thrombocytopenia ([place])    Past Surgical History:  Procedure Laterality Date  . ABDOMINAL HYSTERECTOMY    . APPENDECTOMY    . BLADDER SURGERY     bladder removed d/t cancer  . CHOLECYSTECTOMY    . COLON RESECTION     with colostomy then colostomy reversal  . COLON SURGERY  2004  . COLON SURGERY  2001  . COLONOSCOPY    . ESOPHAGOGASTRODUODENOSCOPY  02/27/2012   Procedure: ESOPHAGOGASTRODUODENOSCOPY (EGD);  Surgeon: [name], MD;  Location: [name];  Service: Endoscopy;  Laterality: N/A;  bedside case  . NEPHROLITHOTOMY Left  05/04/2014   Procedure: LEFT PERCUTANEOUS NEPHROLITHOTOMY ;  Surgeon: [name], MD;  Location: [facility];  Service: Urology;  Laterality: Left;  . TOTAL KNEE ARTHROPLASTY  2010   Left knee   Current Outpatient Prescriptions on File Prior to Visit  Medication Sig Dispense Refill  . acidophilus (RISAQUAD) CAPS capsule Take 1 capsule by mouth daily.    Marland Kitchen Apoaequorin (PREVAGEN EXTRA STRENGTH) 20 MG CAPS Take 20 mg by mouth daily.    . Ascorbic Acid (VITAMIN C) 1000 MG tablet Take 1,000 mg by mouth daily.    . benzonatate (TESSALON) 100 MG capsule Take 1 capsule (100 mg total) by mouth every 8 (eight) hours. 21 capsule 0  . cholecalciferol (VITAMIN D) 1000 units tablet Take 1,000 Units by mouth daily.    . Cinnamon 500 MG capsule Take 500 mg by mouth daily.     . citalopram (CELEXA) 40 MG tablet Take 20 mg by mouth daily.    . fexofenadine (ALLEGRA) 180 MG tablet Take 180 mg by mouth daily.    Marland Kitchen GENERLAC 10 GM/15ML SOLN TK 30 MLS PO QD PRN FOR MILD CONSTIPATION  0  . Ginkgo Biloba 60 MG TABS Take 60 mg by mouth daily.     Marland Kitchen lactulose (CHRONULAC) 10 GM/15ML solution Take 30 mLs (20  g total) by mouth daily as needed for mild constipation (for constipation). 240 mL 0  . losartan (COZAAR) 25 MG tablet Take 25 mg by mouth daily.    . Multiple Vitamin (MULTIVITAMIN WITH MINERALS) TABS tablet Take 1 tablet by mouth daily.    Marland Kitchen omega-3 acid ethyl esters (LOVAZA) 1 g capsule Take 1 g by mouth daily.    . pantoprazole (PROTONIX) 40 MG tablet Take 1 tablet (40 mg total) by mouth daily. 30 tablet 11  . spironolactone (ALDACTONE) 50 MG tablet TAKE 1 TABLET(50 MG) BY MOUTH DAILY 90 tablet 1  . vitamin B-12 (CYANOCOBALAMIN) 1000 MCG tablet Take 1,000 mcg by mouth daily.    . vitamin E 400 UNIT capsule Take 400 Units by mouth daily.     No current facility-administered medications on file prior to visit.    Allergies  Allergen Reactions  . Bee Venom Anaphylaxis  . Codeine Anaphylaxis and Rash  . Morphine  And Related Anaphylaxis and Rash  . Phytonadione Other (See Comments)    Pt experienced an episode of cyanosis following dose of Vitamin K 10 mg IV.  Marland Kitchen Promethazine Hcl Other (See Comments)    Reaction:  Unknown   . Penicillins Rash and Other (See Comments)    Has patient had a PCN reaction causing immediate rash, facial/tongue/throat swelling, SOB or lightheadedness with hypotension: No Has patient had a PCN reaction causing severe rash involving mucus membranes or skin necrosis: No Has patient had a PCN reaction that required hospitalization: No Has patient had a PCN reaction occurring within the last 10 years: No If all of the above answers are "NO", then may proceed with Cephalosporin use.   Social History   Social History  . Marital status: Married    Spouse name: N/A  . Number of children: N/A  . Years of education: N/A   Occupational History  . Not on file.   Social History Main Topics  . Smoking status: Former Smoker    Packs/day: 0.50    Years: 35.00    Types: Cigarettes    Start date: 10/25/1955    Quit date: 09/22/1991  . Smokeless tobacco: Never Used     Comment: quit 23years ago  . Alcohol use No  . Drug use: No  . Sexual activity: Yes    Birth control/ protection: Surgical   Other Topics Concern  . Not on file   Social History Narrative   Lives at home with her husband, ambulatory      Review of Systems  All other systems reviewed and are negative.      Objective:   Physical Exam  Constitutional: She appears well-developed and well-nourished.  HENT:  Right Ear: External ear normal.  Left Ear: External ear normal.  Nose: Nose normal.  Mouth/Throat: Oropharynx is clear and moist. No oropharyngeal exudate.  Eyes: Conjunctivae are normal.  Neck: Neck supple.  Cardiovascular: Normal rate, regular rhythm and normal heart sounds.   Pulmonary/Chest: Effort normal. No respiratory distress. She has rhonchi in the right lower field and the left lower  field. She has rales.  Abdominal: Soft. Bowel sounds are normal. She exhibits no distension. There is no tenderness. There is no rebound and no guarding.  Lymphadenopathy:    She has no cervical adenopathy.  Vitals reviewed.         Assessment & Plan:  Was recently admitted with fever and thought to have community-acquired pneumonia complicated by urinary tract infection. Was treated with Levaquin. Since  discharge, the fever has improved but the cough has worsened. I suspect poor pulmonary toilet coupled with possible HCAP.   I recommend incentive spirometry 4+ times a day, deep breathing exercises to address the atelectasis, mucinex as a mucolytic.  Also would add a zpack to cover atypicals and ceftin 500 bid for 10 days to cover strepococcal pneumonia.  Recheck in 1 week.

## 2017-04-07 ENCOUNTER — Encounter: Payer: Self-pay | Admitting: Family Medicine

## 2017-04-07 ENCOUNTER — Ambulatory Visit (INDEPENDENT_AMBULATORY_CARE_PROVIDER_SITE_OTHER): Payer: Medicare Other | Admitting: Family Medicine

## 2017-04-07 VITALS — BP 118/66 | HR 84 | Temp 98.3°F | Resp 20 | Ht 65.0 in | Wt 235.0 lb

## 2017-04-07 DIAGNOSIS — R059 Cough, unspecified: Secondary | ICD-10-CM

## 2017-04-07 DIAGNOSIS — R05 Cough: Secondary | ICD-10-CM | POA: Diagnosis not present

## 2017-04-07 NOTE — Progress Notes (Signed)
Subjective:    Patient ID: [name], female    DOB: 16-Jun-1941, 76 y.o.   MRN: [id number]  HPI  03/13/17 The patient presents with her husband with a 48 hour history of fever to 101-102, occasional cough, fatigue, lethargy. Patient has a history of bladder cancer and had her bladder removed. She has an external urostomy. The urine in her collection bag has been reddish brown dark and foul-smelling as well.   Urinalysis will likely show only contamination given the fact it is a collection bag that is chronic. Exam is significant for faint posterior basilar crackles in the right lower lung.  At that time, my plan was: Given the fact the patient has a urostomy bag, I doubt that a urinalysis will be of much benefit. Grossly the urine appears abnormal as it is dark and slightly tea-colored.  I will send a sample for a urine culture. I will also check the patient for pneumonia with a chest x-ray although my leading suspicion based on her exam is that she has developed a community-acquired pneumonia. Begin the patient on Levaquin 500 milligrams by mouth daily for 7 days. This should also cover for urinary tract infections  03/31/17 Apparently, the pharmacy would not give her the Levaquin due to concern about interaction with Levaquin and citalopram. They called my office and I approved her to take Levaquin but for whatever reason the patient never got the antibiotic. She says really presented to the hospital the next day and was diagnosed with community-acquired pneumonia as well as enterococcus urinary tract infection and was started back on Levaquin. I have copied relevant portions of the discharge summary and included them below for my reference:  Admit date: 03/14/2017 Discharge date: 03/18/2017  Admitted From:home Disposition:home with home care services  Recommendations for Outpatient Follow-up:  1. Follow up with PCP in 1-2 weeks 2. Please obtain BMP/CBC in one week  Home  Health:yes Equipment/Devices:none Discharge Condition:stable CODE STATUS:full code Diet recommendation:heart healthy  Brief/Interim Summary: 76 y.o.femalewith medical history significant of nonalcoholic cirrhosis, colon and bladder cancer with urostomy came into the hospital because of weakness, cough and confusion. Patient went to her PCP the day prior to admission when levofloxacin was prescribed, unable to take it (per husband pharmacy reported interaction was medication and prescription was not filled), she worsening with more fevers so he brought her to the hospital for further evaluation. In the ED she develop fever of 101.4, CXR showed a scarring  #Possible sepsis due to acute bronchitis/early pneumonia and UTI: -Lactic acid improved. Urine c/s growing enterococcus sensitive to Levaquin. Continue Levaquin For 3 more days. Patient clinically improved.  #Pancytopenia in the setting of chronic hepatic disease and acute infection in the hospital: Treated with antibiotics. Also received Neupogen. White blood cell count improved. Patient is afebrile.. Count is low but is stable. No sign of bleeding. I recommended patient to follow-up with oncologist outpatient and repeat lab next week. Discussed with the patient has patient regarding discharge planning.  #Acute metabolic encephalopathy versus acute hepatitic encephalopathy: Ammonia level improved. Mental status also improved. -Mental status around baseline. The patient likely has underlying dementia. Discharging with oral lactulose as needed.  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 Recommended to follow up with PCP and with GI.  #History of colon cancer/pleura cancer: Recommended to follow-up with her oncologist outpatient.  #History of urostomy with recurrent UTI: Continue Levaquin for 3 more days.  #Hypokalemia:  Serum  potassium level improved  03/31/17 The  patient is here today for follow-up. However she is coughing constantly throughout our exam. Husband states that this cough began after she left the hospital. It has been steadily worsening. She is having a difficult time breaking up the mucus and coughing up the congestion.  Denies pleurisy. Denies hemoptysis. Denies purulent sputum. However pulmonary exam today is worse from when I saw her last. She has worsening bibasilar crackles. She has rales throughout. There is no expiratory wheezing.  Husband denies fevers. Since completing Levaquin, the patient has defervesced. Since discharge from the hospital, she was started on spironolactone by Dr. Marin Robertson due to cirrhosis.  At that time, my plan was: Was recently admitted with fever and thought to have community-acquired pneumonia complicated by urinary tract infection. Was treated with Levaquin. Since discharge, the fever has improved but the cough has worsened. I suspect poor pulmonary toilet coupled with possible HCAP.   I recommend incentive spirometry 4+ times a day, deep breathing exercises to address the atelectasis, mucinex as a mucolytic.  Also would add a zpack to cover atypicals and ceftin 500 bid for 10 days to cover strepococcal pneumonia.  Recheck in 1 week.    04/07/17 The patient's cough has improved dramatically according to her husband. He denies any productive cough. He denies any shortness of breath. He denies any sputum. He denies any fevers or chills.  She has completed the Zithromax. She has 3 days left on the Ceftin. On pulmonary exam today, her lungs are much improved from last week. There are no basilar crackles. There are no basilar Rales. Her lung sounds are no longer diminished. Husband states that she still has dark-colored urine. He does not believe that she is drinking enough water. He is trying to encourage at least a bottle a day of water but he has a difficult time getting her to drink this. Past Medical History:  Diagnosis Date   . Anemia, iron deficiency   . Arthritis   . Bladder cancer [facility]) 2003   Bladder removed - Dr [name]  . Cataract immature   . Cirrhosis ([place]) 08/26/2011  . Colon cancer [facility]) 2001   Hemicolectomy - [name]  . Depression   . GERD (gastroesophageal reflux disease)   . Headache(784.0)   . Hemorrhoids   . History of colon polyps   . History of DVT (deep vein thrombosis) 50+yrs ago   With pregnancy  . History of GI bleed   . History of kidney stones   . History of urostomy    Urostomy d/t hx bladder cancer  . Hx of transfusion of whole blood   . Hypertension   . Joint pain   . Memory loss    Chemotherapy; short and long term  . Osteoporosis   . Thrombocytopenia ([place])    Past Surgical History:  Procedure Laterality Date  . ABDOMINAL HYSTERECTOMY    . APPENDECTOMY    . BLADDER SURGERY     bladder removed d/t cancer  . CHOLECYSTECTOMY    . COLON RESECTION     with colostomy then colostomy reversal  . COLON SURGERY  2004  . COLON SURGERY  2001  . COLONOSCOPY    . ESOPHAGOGASTRODUODENOSCOPY  02/27/2012   Procedure: ESOPHAGOGASTRODUODENOSCOPY (EGD);  Surgeon: [name], MD;  Location: [name];  Service: Endoscopy;  Laterality: N/A;  bedside case  . NEPHROLITHOTOMY Left 05/04/2014   Procedure: LEFT PERCUTANEOUS NEPHROLITHOTOMY ;  Surgeon: [name], MD;  Location:  [facility];  Service: Urology;  Laterality: Left;  . TOTAL KNEE ARTHROPLASTY  2010   Left knee   Current Outpatient Prescriptions on File Prior to Visit  Medication Sig Dispense Refill  . acidophilus (RISAQUAD) CAPS capsule Take 1 capsule by mouth daily.    Marland Kitchen Apoaequorin (PREVAGEN EXTRA STRENGTH) 20 MG CAPS Take 20 mg by mouth daily.    . Ascorbic Acid (VITAMIN C) 1000 MG tablet Take 1,000 mg by mouth daily.    Marland Kitchen azithromycin (ZITHROMAX) 250 MG tablet 2 tabs poqday1, 1 tab poqday 2-5 6 tablet 0  . benzonatate (TESSALON) 100 MG capsule Take 1 capsule (100 mg total) by mouth every 8 (eight) hours. 21 capsule 0   . cefUROXime (CEFTIN) 500 MG tablet Take 1 tablet (500 mg total) by mouth 2 (two) times daily with a meal. 20 tablet 0  . cholecalciferol (VITAMIN D) 1000 units tablet Take 1,000 Units by mouth daily.    . Cinnamon 500 MG capsule Take 500 mg by mouth daily.     . citalopram (CELEXA) 40 MG tablet Take 20 mg by mouth daily.    . fexofenadine (ALLEGRA) 180 MG tablet Take 180 mg by mouth daily.    Marland Kitchen GENERLAC 10 GM/15ML SOLN TK 30 MLS PO QD PRN FOR MILD CONSTIPATION  0  . Ginkgo Biloba 60 MG TABS Take 60 mg by mouth daily.     Marland Kitchen lactulose (CHRONULAC) 10 GM/15ML solution Take 30 mLs (20 g total) by mouth daily as needed for mild constipation (for constipation). 240 mL 0  . losartan (COZAAR) 25 MG tablet Take 25 mg by mouth daily.    . Multiple Vitamin (MULTIVITAMIN WITH MINERALS) TABS tablet Take 1 tablet by mouth daily.    Marland Kitchen omega-3 acid ethyl esters (LOVAZA) 1 g capsule Take 1 g by mouth daily.    . pantoprazole (PROTONIX) 40 MG tablet Take 1 tablet (40 mg total) by mouth daily. 30 tablet 11  . spironolactone (ALDACTONE) 50 MG tablet TAKE 1 TABLET(50 MG) BY MOUTH DAILY 90 tablet 1  . vitamin B-12 (CYANOCOBALAMIN) 1000 MCG tablet Take 1,000 mcg by mouth daily.    . vitamin E 400 UNIT capsule Take 400 Units by mouth daily.     No current facility-administered medications on file prior to visit.    Allergies  Allergen Reactions  . Bee Venom Anaphylaxis  . Codeine Anaphylaxis and Rash  . Morphine And Related Anaphylaxis and Rash  . Phytonadione Other (See Comments)    Pt experienced an episode of cyanosis following dose of Vitamin K 10 mg IV.  Marland Kitchen Promethazine Hcl Other (See Comments)    Reaction:  Unknown   . Penicillins Rash and Other (See Comments)    Has patient had a PCN reaction causing immediate rash, facial/tongue/throat swelling, SOB or lightheadedness with hypotension: No Has patient had a PCN reaction causing severe rash involving mucus membranes or skin necrosis: No Has patient had  a PCN reaction that required hospitalization: No Has patient had a PCN reaction occurring within the last 10 years: No If all of the above answers are "NO", then may proceed with Cephalosporin use.   Social History   Social History  . Marital status: Married    Spouse name: N/A  . Number of children: N/A  . Years of education: N/A   Occupational History  . Not on file.   Social History Main Topics  . Smoking status: Former Smoker    Packs/day: 0.50  Years: 35.00    Types: Cigarettes    Start date: 10/25/1955    Quit date: 09/22/1991  . Smokeless tobacco: Never Used     Comment: quit 23years ago  . Alcohol use No  . Drug use: No  . Sexual activity: Yes    Birth control/ protection: Surgical   Other Topics Concern  . Not on file   Social History Narrative   Lives at home with her husband, ambulatory      Review of Systems  All other systems reviewed and are negative.      Objective:   Physical Exam  Constitutional: She appears well-developed and well-nourished.  HENT:  Right Ear: External ear normal.  Left Ear: External ear normal.  Nose: Nose normal.  Mouth/Throat: Oropharynx is clear and moist. No oropharyngeal exudate.  Eyes: Conjunctivae are normal.  Neck: Neck supple.  Cardiovascular: Normal rate, regular rhythm and normal heart sounds.   Pulmonary/Chest: Effort normal. No respiratory distress. She has no decreased breath sounds. She has no wheezes. She has no rhonchi. She has no rales.  Abdominal: Soft. Bowel sounds are normal. She exhibits no distension. There is no tenderness. There is no rebound and no guarding.  Lymphadenopathy:    She has no cervical adenopathy.  Vitals reviewed.         Assessment & Plan:  Cough  Cough has improved. I see no further treatment necessary at this point. She is completing her antibiotics. I recommended finishing the next 3 days of Ceftin. I believe her husband can discontinue Mucinex at this point. I see no  evidence of residual pneumonia clinically on her exam. Her lungs sound much better than they did a week ago. I did encourage her to try to drink more water. I would like to see her get at least 3 bottles a day if possible to prevent dehydration.

## 2017-05-14 ENCOUNTER — Ambulatory Visit (HOSPITAL_BASED_OUTPATIENT_CLINIC_OR_DEPARTMENT_OTHER): Payer: Medicare Other | Admitting: Family

## 2017-05-14 ENCOUNTER — Other Ambulatory Visit (HOSPITAL_BASED_OUTPATIENT_CLINIC_OR_DEPARTMENT_OTHER): Payer: Medicare Other

## 2017-05-14 VITALS — BP 132/58 | HR 60 | Temp 98.0°F | Resp 20 | Wt 233.0 lb

## 2017-05-14 DIAGNOSIS — D500 Iron deficiency anemia secondary to blood loss (chronic): Secondary | ICD-10-CM

## 2017-05-14 DIAGNOSIS — C679 Malignant neoplasm of bladder, unspecified: Secondary | ICD-10-CM

## 2017-05-14 DIAGNOSIS — D509 Iron deficiency anemia, unspecified: Secondary | ICD-10-CM | POA: Diagnosis not present

## 2017-05-14 DIAGNOSIS — K7460 Unspecified cirrhosis of liver: Secondary | ICD-10-CM | POA: Diagnosis not present

## 2017-05-14 DIAGNOSIS — R441 Visual hallucinations: Secondary | ICD-10-CM | POA: Diagnosis not present

## 2017-05-14 DIAGNOSIS — D696 Thrombocytopenia, unspecified: Secondary | ICD-10-CM

## 2017-05-14 DIAGNOSIS — C189 Malignant neoplasm of colon, unspecified: Secondary | ICD-10-CM

## 2017-05-14 LAB — CMP (CANCER CENTER ONLY)
ALBUMIN: 3.1 g/dL — AB (ref 3.3–5.5)
ALT(SGPT): 18 U/L (ref 10–47)
AST: 33 U/L (ref 11–38)
Alkaline Phosphatase: 69 U/L (ref 26–84)
BILIRUBIN TOTAL: 1.3 mg/dL (ref 0.20–1.60)
BUN, Bld: 16 mg/dL (ref 7–22)
CALCIUM: 10 mg/dL (ref 8.0–10.3)
CO2: 28 meq/L (ref 18–33)
Chloride: 110 mEq/L — ABNORMAL HIGH (ref 98–108)
Creat: 0.7 mg/dl (ref 0.6–1.2)
Glucose, Bld: 132 mg/dL — ABNORMAL HIGH (ref 73–118)
POTASSIUM: 4.1 meq/L (ref 3.3–4.7)
Sodium: 140 mEq/L (ref 128–145)
Total Protein: 6.1 g/dL — ABNORMAL LOW (ref 6.4–8.1)

## 2017-05-14 LAB — CBC WITH DIFFERENTIAL (CANCER CENTER ONLY)
BASO#: 0 10*3/uL (ref 0.0–0.2)
BASO%: 0.3 % (ref 0.0–2.0)
EOS ABS: 0.1 10*3/uL (ref 0.0–0.5)
EOS%: 2.4 % (ref 0.0–7.0)
HEMATOCRIT: 32.7 % — AB (ref 34.8–46.6)
HEMOGLOBIN: 10.8 g/dL — AB (ref 11.6–15.9)
LYMPH#: 0.7 10*3/uL — AB (ref 0.9–3.3)
LYMPH%: 25.7 % (ref 14.0–48.0)
MCH: 29 pg (ref 26.0–34.0)
MCHC: 33 g/dL (ref 32.0–36.0)
MCV: 88 fL (ref 81–101)
MONO#: 0.4 10*3/uL (ref 0.1–0.9)
MONO%: 12.5 % (ref 0.0–13.0)
NEUT%: 59.1 % (ref 39.6–80.0)
NEUTROS ABS: 1.7 10*3/uL (ref 1.5–6.5)
Platelets: 72 10*3/uL — ABNORMAL LOW (ref 145–400)
RBC: 3.72 10*6/uL (ref 3.70–5.32)
RDW: 17.4 % — ABNORMAL HIGH (ref 11.1–15.7)
WBC: 2.9 10*3/uL — ABNORMAL LOW (ref 3.9–10.0)

## 2017-05-14 NOTE — Progress Notes (Signed)
Hematology and Oncology Follow Up Visit  [name] [id number] [birth year] 76 y.o. 05/14/2017   Principle Diagnosis:  1. Recurrent iron-deficiency anemia. 2. 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 3. Remote history of stage II (T3, N0, M0) adenocarcinoma of colon. 4. 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 Her husband states that she has been having hallucinations, seeing birds and helicopters that are not there. He has cut her aldactone to 25 mg PO daily.  Her ammonia level is pending. She has occasional itching on her arms and will try taking Claritin instead of Allegra. No fever, chills, n/v, cough, dizziness, SOB, chest pain, palpitations, abdominal pain or changes in bowel or bladder habits.  No swelling, tenderness, numbness or tingling in her extremities. No c/o pain at this time.  She has maintained a good appetite and is staying hydrated. Her weight is stable.   ECOG Performance Status: 1 - Symptomatic but completely ambulatory  Medications:  Allergies as of 05/14/2017      Reactions   Bee Venom Anaphylaxis   Codeine Anaphylaxis, Rash   Morphine And Related Anaphylaxis, Rash   Phytonadione Other (See Comments)   Pt experienced an episode of cyanosis following dose of Vitamin K 10 mg IV.   Promethazine Hcl Other (See Comments)   Reaction:  Unknown    Penicillins Rash, Other (See Comments)   Has patient had a PCN reaction causing immediate rash, facial/tongue/throat swelling, SOB or lightheadedness with hypotension: No Has patient had a PCN reaction causing severe rash involving mucus membranes or skin necrosis: No Has patient had a PCN reaction that required hospitalization: No Has patient had a PCN reaction occurring within the last 10 years: No If all of the above answers are  "NO", then may proceed with Cephalosporin use.      Medication List       Accurate as of 05/14/17  4:13 PM. Always use your most recent med list.          acidophilus Caps capsule Take 1 capsule by mouth daily.   benzonatate 100 MG capsule Commonly known as:  TESSALON Take 1 capsule (100 mg total) by mouth every 8 (eight) hours.   cefUROXime 500 MG tablet Commonly known as:  CEFTIN Take 1 tablet (500 mg total) by mouth 2 (two) times daily with a meal.   cholecalciferol 1000 units tablet Commonly known as:  VITAMIN D Take 1,000 Units by mouth daily.   Cinnamon 500 MG capsule Take 500 mg by mouth daily.   citalopram 40 MG tablet Commonly known as:  CELEXA Take 20 mg by mouth daily.   fexofenadine 180 MG tablet Commonly known as:  ALLEGRA Take 180 mg by mouth daily.   GENERLAC 10 GM/15ML Soln Generic drug:  lactulose (encephalopathy) TK 30 MLS PO QD PRN FOR MILD CONSTIPATION   Ginkgo Biloba 60 MG Tabs Take 60 mg by mouth daily.   lactulose 10 GM/15ML solution Commonly known as:  CHRONULAC Take 30 mLs (20 g total) by mouth daily as needed for mild constipation (for constipation).   losartan 25 MG tablet Commonly known as:  COZAAR Take 25 mg by mouth daily.   multivitamin with minerals Tabs tablet Take 1 tablet by mouth daily.   omega-3 acid ethyl esters 1 g capsule Commonly known as:  LOVAZA  Take 1 g by mouth daily.   pantoprazole 40 MG tablet Commonly known as:  PROTONIX Take 1 tablet (40 mg total) by mouth daily.   PREVAGEN EXTRA STRENGTH 20 MG Caps Generic drug:  Apoaequorin Take 20 mg by mouth daily.   spironolactone 50 MG tablet Commonly known as:  ALDACTONE TAKE 1 TABLET(50 MG) BY MOUTH DAILY   vitamin B-12 1000 MCG tablet Commonly known as:  CYANOCOBALAMIN Take 1,000 mcg by mouth daily.   vitamin C 1000 MG tablet Take 1,000 mg by mouth daily.   vitamin E 400 UNIT capsule Take 400 Units by mouth daily.       Allergies:  Allergies    Allergen Reactions  . Bee Venom Anaphylaxis  . Codeine Anaphylaxis and Rash  . Morphine And Related Anaphylaxis and Rash  . Phytonadione Other (See Comments)    Pt experienced an episode of cyanosis following dose of Vitamin K 10 mg IV.  Marland Kitchen Promethazine Hcl Other (See Comments)    Reaction:  Unknown   . Penicillins Rash and Other (See Comments)    Has patient had a PCN reaction causing immediate rash, facial/tongue/throat swelling, SOB or lightheadedness with hypotension: No Has patient had a PCN reaction causing severe rash involving mucus membranes or skin necrosis: No Has patient had a PCN reaction that required hospitalization: No Has patient had a PCN reaction occurring within the last 10 years: No If all of the above answers are "NO", then may proceed with Cephalosporin use.    Past Medical History, Surgical history, Social history, and Family History were reviewed and updated.  Review of Systems: All other 10 point review of systems is negative.   Physical Exam:  weight is 233 lb (105.7 kg). Her oral temperature is 98 F (36.7 C). Her blood pressure is 132/58 (abnormal) and her pulse is 60. Her respiration is 20 and oxygen saturation is 98%.   Wt Readings from Last 3 Encounters:  05/14/17 233 lb (105.7 kg)  04/07/17 235 lb (106.6 kg)  03/31/17 232 lb (105.2 kg)    Ocular: Sclerae unicteric, pupils equal, round and reactive to light Ear-nose-throat: Oropharynx clear, dentition fair Lymphatic: No cervical, supraclavicular or axillary adenopathy Lungs no rales or rhonchi, good excursion bilaterally Heart regular rate and rhythm, no murmur appreciated Abd soft, nontender, positive bowel sounds, no liver or spleen tip palpated on exam, no fluid wave  MSK no focal spinal tenderness, no joint edema Neuro: non-focal, pleasantly confused to time, place and person (me), knows husband, appropriate affect Breasts: Deferred   Lab Results  Component Value Date   WBC 2.9 (L)  05/14/2017   HGB 10.8 (L) 05/14/2017   HCT 32.7 (L) 05/14/2017   MCV 88 05/14/2017   PLT 72 few large plts. (L) 05/14/2017   Lab Results  Component Value Date   FERRITIN 30 03/24/2017   IRON 102 03/24/2017   TIBC 304 03/24/2017   UIBC 202 03/24/2017   IRONPCTSAT 34 03/24/2017   Lab Results  Component Value Date   RETICCTPCT 2.0 11/23/2014   RBC 3.72 05/14/2017   RETICCTABS 74.4 11/23/2014   No results found for: KPAFRELGTCHN, LAMBDASER, KAPLAMBRATIO No results found for: IGGSERUM, IGA, IGMSERUM No results found for: TOTALPROTELP, ALBUMINELP, A1GS, A2GS, BETS, BETA2SER, GAMS, MSPIKE, SPEI   Chemistry      Component Value Date/Time   NA 140 05/14/2017 1454   K 4.1 05/14/2017 1454   CL 110 (H) 05/14/2017 1454   CO2 28 05/14/2017 1454   BUN  16 05/14/2017 1454   CREATININE 0.7 05/14/2017 1454      Component Value Date/Time   CALCIUM 10.0 05/14/2017 1454   ALKPHOS 69 05/14/2017 1454   AST 33 05/14/2017 1454   ALT 18 05/14/2017 1454   BILITOT 1.30 05/14/2017 1454      Impression and Plan: Ms. Laird is a pleasantly confused 76 yo caucasian female with iron deficiency anemia. She has no complaints at this time.  We will see what her iron studies show and bring her back in next week for infusion if needed.  I encourage her husband to notify her PCP of the new hallucinations. I will route my note from today to him so that he is aware. She is currently on Prevagen ES.  We will plan to see her back again in another 3 months for repeat lab work and follow-up.  Her husband will contact our office with any questions or concerns. We can certainly see her sooner if need be.   Eliezer Bottom, NP 8/23/20184:13 PM\

## 2017-05-15 ENCOUNTER — Other Ambulatory Visit: Payer: Self-pay | Admitting: Family

## 2017-05-15 DIAGNOSIS — R7989 Other specified abnormal findings of blood chemistry: Secondary | ICD-10-CM

## 2017-05-15 DIAGNOSIS — G9340 Encephalopathy, unspecified: Secondary | ICD-10-CM

## 2017-05-15 LAB — AMMONIA: AMMONIA, PLASMA: 215 ug/dL — AB (ref 19–87)

## 2017-05-15 LAB — IRON AND TIBC
%SAT: 17 % — AB (ref 21–57)
IRON: 58 ug/dL (ref 41–142)
TIBC: 331 ug/dL (ref 236–444)
UIBC: 274 ug/dL (ref 120–384)

## 2017-05-15 LAB — FERRITIN: FERRITIN: 12 ng/mL (ref 9–269)

## 2017-05-15 MED ORDER — LACTULOSE 10 GM/15ML PO SOLN: 20.0000 g | Freq: Three times a day (TID) | ORAL | 0 refills | Status: DC

## 2017-05-15 NOTE — Progress Notes (Signed)
Left message to start taking Lactulose 30 ml TID for ammonia level of 215. I will also forward her lab work to her PCP now for further evaluation and follow-up.

## 2017-05-18 ENCOUNTER — Telehealth: Payer: Self-pay | Admitting: Family

## 2017-05-18 ENCOUNTER — Other Ambulatory Visit: Payer: Self-pay | Admitting: Family

## 2017-05-18 DIAGNOSIS — G9340 Encephalopathy, unspecified: Secondary | ICD-10-CM

## 2017-05-18 NOTE — Progress Notes (Signed)
I'd like to see her in ov.

## 2017-05-18 NOTE — Telephone Encounter (Signed)
I left a message again with call back number to let us know if [name] has restarted her lactulose. She will also need repeat labs this week. LOS sent to [facility].

## 2017-05-19 ENCOUNTER — Other Ambulatory Visit: Payer: Self-pay | Admitting: Gastroenterology

## 2017-05-19 DIAGNOSIS — K7460 Unspecified cirrhosis of liver: Secondary | ICD-10-CM | POA: Diagnosis not present

## 2017-05-19 DIAGNOSIS — K7469 Other cirrhosis of liver: Secondary | ICD-10-CM

## 2017-05-19 DIAGNOSIS — F09 Unspecified mental disorder due to known physiological condition: Secondary | ICD-10-CM | POA: Diagnosis not present

## 2017-05-21 ENCOUNTER — Other Ambulatory Visit: Payer: Medicare Other

## 2017-05-21 ENCOUNTER — Other Ambulatory Visit (HOSPITAL_BASED_OUTPATIENT_CLINIC_OR_DEPARTMENT_OTHER): Payer: Medicare Other

## 2017-05-21 ENCOUNTER — Ambulatory Visit (HOSPITAL_BASED_OUTPATIENT_CLINIC_OR_DEPARTMENT_OTHER): Payer: Medicare Other

## 2017-05-21 VITALS — BP 103/46 | HR 52 | Temp 98.2°F | Resp 20

## 2017-05-21 DIAGNOSIS — G9340 Encephalopathy, unspecified: Secondary | ICD-10-CM | POA: Diagnosis not present

## 2017-05-21 DIAGNOSIS — D500 Iron deficiency anemia secondary to blood loss (chronic): Secondary | ICD-10-CM

## 2017-05-21 DIAGNOSIS — D509 Iron deficiency anemia, unspecified: Secondary | ICD-10-CM

## 2017-05-21 LAB — CBC WITH DIFFERENTIAL (CANCER CENTER ONLY)
BASO#: 0 10*3/uL (ref 0.0–0.2)
BASO%: 0.4 % (ref 0.0–2.0)
EOS%: 3.1 % (ref 0.0–7.0)
Eosinophils Absolute: 0.1 10*3/uL (ref 0.0–0.5)
HEMATOCRIT: 32.5 % — AB (ref 34.8–46.6)
HGB: 10.7 g/dL — ABNORMAL LOW (ref 11.6–15.9)
LYMPH#: 0.6 10*3/uL — AB (ref 0.9–3.3)
LYMPH%: 23.2 % (ref 14.0–48.0)
MCH: 29.1 pg (ref 26.0–34.0)
MCHC: 32.9 g/dL (ref 32.0–36.0)
MCV: 88 fL (ref 81–101)
MONO#: 0.3 10*3/uL (ref 0.1–0.9)
MONO%: 13.1 % — ABNORMAL HIGH (ref 0.0–13.0)
NEUT#: 1.6 10*3/uL (ref 1.5–6.5)
NEUT%: 60.2 % (ref 39.6–80.0)
Platelets: 66 10*3/uL — ABNORMAL LOW (ref 145–400)
RBC: 3.68 10*6/uL — AB (ref 3.70–5.32)
RDW: 17.1 % — ABNORMAL HIGH (ref 11.1–15.7)
WBC: 2.6 10*3/uL — AB (ref 3.9–10.0)

## 2017-05-21 LAB — CMP (CANCER CENTER ONLY)
ALBUMIN: 3 g/dL — AB (ref 3.3–5.5)
ALK PHOS: 71 U/L (ref 26–84)
ALT: 15 U/L (ref 10–47)
AST: 30 U/L (ref 11–38)
BUN: 14 mg/dL (ref 7–22)
CALCIUM: 9.3 mg/dL (ref 8.0–10.3)
CO2: 28 mEq/L (ref 18–33)
Chloride: 105 mEq/L (ref 98–108)
Creat: 0.8 mg/dl (ref 0.6–1.2)
Glucose, Bld: 143 mg/dL — ABNORMAL HIGH (ref 73–118)
POTASSIUM: 4 meq/L (ref 3.3–4.7)
Sodium: 138 mEq/L (ref 128–145)
TOTAL PROTEIN: 5.9 g/dL — AB (ref 6.4–8.1)
Total Bilirubin: 1.6 mg/dl (ref 0.20–1.60)

## 2017-05-21 MED ORDER — SODIUM CHLORIDE 0.9 % IV SOLN
510.0000 mg | Freq: Once | INTRAVENOUS | Status: AC
  Administered 2017-05-21: 510 mg via INTRAVENOUS
  Filled 2017-05-21: qty 17

## 2017-05-21 MED ORDER — SODIUM CHLORIDE 0.9 % IV SOLN
INTRAVENOUS | Status: DC
  Administered 2017-05-21: 14:00:00 via INTRAVENOUS

## 2017-05-21 NOTE — Patient Instructions (Signed)

## 2017-05-22 ENCOUNTER — Ambulatory Visit
Admission: RE | Admit: 2017-05-22 | Discharge: 2017-05-22 | Disposition: A | Discharge status: Home / Self Care | Payer: Medicare Other | Source: Ambulatory Visit | Attending: Gastroenterology | Admitting: Gastroenterology

## 2017-05-22 DIAGNOSIS — K7460 Unspecified cirrhosis of liver: Secondary | ICD-10-CM | POA: Diagnosis not present

## 2017-05-22 DIAGNOSIS — K7469 Other cirrhosis of liver: Secondary | ICD-10-CM

## 2017-05-22 LAB — AMMONIA: Ammonia, Plasma: 102 ug/dL — ABNORMAL HIGH (ref 19–87)

## 2017-05-26 ENCOUNTER — Encounter: Payer: Self-pay | Admitting: Family Medicine

## 2017-05-26 ENCOUNTER — Ambulatory Visit (INDEPENDENT_AMBULATORY_CARE_PROVIDER_SITE_OTHER): Payer: Medicare Other | Admitting: Family Medicine

## 2017-05-26 ENCOUNTER — Other Ambulatory Visit: Payer: Self-pay | Admitting: Family Medicine

## 2017-05-26 ENCOUNTER — Other Ambulatory Visit: Payer: Self-pay | Admitting: Family

## 2017-05-26 VITALS — BP 122/68 | HR 80 | Temp 98.0°F | Resp 14 | Ht 65.0 in | Wt 225.0 lb

## 2017-05-26 DIAGNOSIS — F0391 Unspecified dementia with behavioral disturbance: Secondary | ICD-10-CM | POA: Diagnosis not present

## 2017-05-26 DIAGNOSIS — R443 Hallucinations, unspecified: Secondary | ICD-10-CM

## 2017-05-26 DIAGNOSIS — R7989 Other specified abnormal findings of blood chemistry: Secondary | ICD-10-CM

## 2017-05-26 NOTE — Progress Notes (Signed)
Subjective:    Patient ID: [name], female    DOB: April 11, 1941, 76 y.o.   MRN: [id number]  HPI Patient was recently seen by her oncologist. 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 Husband mention at that appointment that the patient was experiencing hallucinations. Ammonia level was taken and was found to be over 200. The patient was started on lactulose for presumed hepatic encephalopathy. Past medical history is complicated by the fact the patient has underlying dementia. Repeat ammonia level was taken and found to be just over 100. Husband has not seen any significant change in her hallucinations. She is still pleasantly confused at her baseline. She is not delirious. She is not combative. She is seeing planes landing in the field behind their house. This is the only hallucinations she reports or that her husband reports. Mini-Mental status exam was performed today. Patient is unable to tell me the year or even the month. She is unable to tell me the date or the day. She is able to tell me that this is a doctor's office and that we are in [facility]. However she does not know the town. She is able to remember 0 out of 3 words on recall. She is unable to spell world in reverse or perform serial sevens. Therefore I'll Mini-Mental status exam, the patient scores 16 out of 30 indicating severe dementia. I believe this is likely the cause of her hallucinations and confusion coupled with her underlying hepatic cirrhosis. However on exam today there is no asterixis. She does not seem encephalopathic. She is alert and active and responds to questions quickly but she is pleasantly confused. She is having diarrhea secondary to the lactulose and is having episodes of fecal incontinence Past Medical History:  Diagnosis Date  . Anemia, iron deficiency   . Arthritis   . Bladder cancer [facility]) 2003   Bladder removed - [name]  .  Cataract immature   . Cirrhosis ([place]) 08/26/2011  . Colon cancer [facility]) 2001   Hemicolectomy - [name]  . Depression   . GERD (gastroesophageal reflux disease)   . Headache(784.0)   . Hemorrhoids   . History of colon polyps   . History of DVT (deep vein thrombosis) 50+yrs ago   With pregnancy  . History of GI bleed   . History of kidney stones   . History of urostomy    Urostomy d/t hx bladder cancer  . Hx of transfusion of whole blood   . Hypertension   . Joint pain   . Memory loss    Chemotherapy; short and long term  . Osteoporosis   . Thrombocytopenia ([place])    Past Surgical History:  Procedure Laterality Date  . ABDOMINAL HYSTERECTOMY    . APPENDECTOMY    . BLADDER SURGERY     bladder removed d/t cancer  . CHOLECYSTECTOMY    . COLON RESECTION     with colostomy then colostomy reversal  . COLON SURGERY  2004  . COLON SURGERY  2001  . COLONOSCOPY    . ESOPHAGOGASTRODUODENOSCOPY  02/27/2012   Procedure: ESOPHAGOGASTRODUODENOSCOPY (EGD);  Surgeon: [name], MD;  Location: [name];  Service: Endoscopy;  Laterality: N/A;  bedside case  . NEPHROLITHOTOMY Left 05/04/2014   Procedure: LEFT PERCUTANEOUS NEPHROLITHOTOMY ;  Surgeon: [name], MD;  Location: [facility];  Service: Urology;  Laterality: Left;  . TOTAL KNEE ARTHROPLASTY  2010   Left knee  Current Outpatient Prescriptions on File Prior to Visit  Medication Sig Dispense Refill  . acidophilus (RISAQUAD) CAPS capsule Take 1 capsule by mouth daily.    Marland Kitchen Apoaequorin (PREVAGEN EXTRA STRENGTH) 20 MG CAPS Take 20 mg by mouth daily.    . Ascorbic Acid (VITAMIN C) 1000 MG tablet Take 1,000 mg by mouth daily.    . cholecalciferol (VITAMIN D) 1000 units tablet Take 1,000 Units by mouth daily.    . Cinnamon 500 MG capsule Take 500 mg by mouth daily.     . citalopram (CELEXA) 40 MG tablet Take 20 mg by mouth daily.    . fexofenadine (ALLEGRA) 180 MG tablet Take 180 mg by mouth daily.    Marland Kitchen GENERLAC 10 GM/15ML SOLN TK  30 MLS PO QD PRN FOR MILD CONSTIPATION  0  . Ginkgo Biloba 60 MG TABS Take 60 mg by mouth daily.     Marland Kitchen losartan (COZAAR) 25 MG tablet Take 25 mg by mouth daily.    . Multiple Vitamin (MULTIVITAMIN WITH MINERALS) TABS tablet Take 1 tablet by mouth daily.    Marland Kitchen omega-3 acid ethyl esters (LOVAZA) 1 g capsule Take 1 g by mouth daily.    . pantoprazole (PROTONIX) 40 MG tablet Take 1 tablet (40 mg total) by mouth daily. 30 tablet 11  . spironolactone (ALDACTONE) 50 MG tablet TAKE 1 TABLET(50 MG) BY MOUTH DAILY 90 tablet 1  . vitamin B-12 (CYANOCOBALAMIN) 1000 MCG tablet Take 1,000 mcg by mouth daily.    . vitamin E 400 UNIT capsule Take 400 Units by mouth daily.     No current facility-administered medications on file prior to visit.    Allergies  Allergen Reactions  . Bee Venom Anaphylaxis  . Codeine Anaphylaxis and Rash  . Morphine And Related Anaphylaxis and Rash  . Phytonadione Other (See Comments)    Pt experienced an episode of cyanosis following dose of Vitamin K 10 mg IV.  Marland Kitchen Promethazine Hcl Other (See Comments)    Reaction:  Unknown   . Penicillins Rash and Other (See Comments)    Has patient had a PCN reaction causing immediate rash, facial/tongue/throat swelling, SOB or lightheadedness with hypotension: No Has patient had a PCN reaction causing severe rash involving mucus membranes or skin necrosis: No Has patient had a PCN reaction that required hospitalization: No Has patient had a PCN reaction occurring within the last 10 years: No If all of the above answers are "NO", then may proceed with Cephalosporin use.   Social History   Social History  . Marital status: Married    Spouse name: N/A  . Number of children: N/A  . Years of education: N/A   Occupational History  . Not on file.   Social History Main Topics  . Smoking status: Former Smoker    Packs/day: 0.50    Years: 35.00    Types: Cigarettes    Start date: 10/25/1955    Quit date: 09/22/1991  . Smokeless  tobacco: Never Used     Comment: quit 23years ago  . Alcohol use No  . Drug use: No  . Sexual activity: Yes    Birth control/ protection: Surgical   Other Topics Concern  . Not on file   Social History Narrative   Lives at home with her husband, ambulatory      Review of Systems  All other systems reviewed and are negative.      Objective:   Physical Exam  Constitutional: She is oriented to  person, place, and time. She appears well-developed and well-nourished. No distress.  HENT:  Head: Normocephalic and atraumatic.  Eyes: Pupils are equal, round, and reactive to light. Conjunctivae and EOM are normal.  Neck: Neck supple.  Cardiovascular: Normal rate, regular rhythm and normal heart sounds.   Pulmonary/Chest: Effort normal and breath sounds normal. No respiratory distress. She has no wheezes. She has no rales.  Lymphadenopathy:    She has no cervical adenopathy.  Neurological: She is alert and oriented to person, place, and time. She has normal reflexes. She displays normal reflexes. No cranial nerve deficit. She exhibits normal muscle tone. Coordination normal.  Skin: She is not diaphoretic.  Psychiatric: She has a normal mood and affect. Her speech is normal and behavior is normal. Thought content normal. Cognition and memory are impaired. She exhibits abnormal recent memory and abnormal remote memory.   No asterixis       Assessment & Plan:  Hallucinations - Plan: CBC with Differential/Platelet, COMPLETE METABOLIC PANEL WITH GFR, Ammonia  Dementia with behavioral disturbance, unspecified dementia type  Although the patient does have hepatic cirrhosis secondary to fatty liver disease, I do not believe that she is experiencing hepatic encephalopathy despite the fact her ammonia level is elevated. There is no asterixis on exam today. She is alert. She is oriented 3. There is no evidence of fetor hepaticus. Many mental status exam is 16 out of 30. Past medical history  significant for severe dementia. I believe the hallucinations are likely secondary to the dementia rather than hepatic cirrhosis. Given the fact she's experiencing diarrhea, I'm concerned that the lactulose could eventually lead to dehydration or other complications without improving the hallucinations. Therefore I recommended that they discontinue the lactulose at the present time unless hallucinations are accompanied by increasing lethargy, an altered level consciousness, and increased levels of sedation

## 2017-05-27 ENCOUNTER — Telehealth: Payer: Self-pay | Admitting: *Deleted

## 2017-05-27 LAB — CBC WITH DIFFERENTIAL/PLATELET
Basophils Absolute: 0 cells/uL (ref 0–200)
Basophils Relative: 0 %
Eosinophils Absolute: 58 cells/uL (ref 15–500)
Eosinophils Relative: 2 %
HEMATOCRIT: 34 % — AB (ref 35.0–45.0)
Hemoglobin: 10.7 g/dL — ABNORMAL LOW (ref 12.0–15.0)
LYMPHS PCT: 24 %
Lymphs Abs: 696 cells/uL — ABNORMAL LOW (ref 850–3900)
MCH: 28.3 pg (ref 27.0–33.0)
MCHC: 31.5 g/dL — AB (ref 32.0–36.0)
MCV: 89.9 fL (ref 80.0–100.0)
MONO ABS: 261 {cells}/uL (ref 200–950)
MONOS PCT: 9 %
NEUTROS PCT: 65 %
Neutro Abs: 1885 cells/uL (ref 1500–7800)
PLATELETS: 65 10*3/uL — AB (ref 140–400)
RBC: 3.78 MIL/uL — ABNORMAL LOW (ref 3.80–5.10)
RDW: 18.8 % — AB (ref 11.0–15.0)
WBC: 2.9 10*3/uL — ABNORMAL LOW (ref 3.8–10.8)

## 2017-05-27 LAB — COMPLETE METABOLIC PANEL WITH GFR
ALT: 13 U/L (ref 6–29)
AST: 20 U/L (ref 10–35)
Albumin: 3.1 g/dL — ABNORMAL LOW (ref 3.6–5.1)
Alkaline Phosphatase: 70 U/L (ref 33–130)
BUN: 14 mg/dL (ref 7–25)
CALCIUM: 9.5 mg/dL (ref 8.6–10.4)
CHLORIDE: 106 mmol/L (ref 98–110)
CO2: 26 mmol/L (ref 20–32)
Creat: 0.72 mg/dL (ref 0.60–0.93)
GFR, EST NON AFRICAN AMERICAN: 80 mL/min (ref >=60)
Glucose, Bld: 167 mg/dL — ABNORMAL HIGH (ref 70–99)
POTASSIUM: 4.4 mmol/L (ref 3.5–5.3)
Sodium: 137 mmol/L (ref 135–146)
Total Bilirubin: 1.2 mg/dL (ref 0.2–1.2)
Total Protein: 5.3 g/dL — ABNORMAL LOW (ref 6.1–8.1)

## 2017-05-27 NOTE — Telephone Encounter (Addendum)
-----   Message from Eliezer Bottom, NP sent at 05/26/2017 10:06 AM EDT ----- Regarding: Ammonia level We are getting there! Continue on lactulose TID! Recheck in 1 week. Schedule message sent to [facility] for lab appointment. Thank you!  Spoke to Merkel (husband) about this.  Husband took wife to see Dr. Lelon Frohlich who Is their primary care physician.  He stopped the lactulose but drew blood work yesterday.  Will discuss will Dr. Bebe Shaggy  ----- Message ----- From: Interface, Lab In Three Zero One Sent: 05/21/2017   1:28 PM To: Eliezer Bottom, NP

## 2017-05-27 NOTE — Telephone Encounter (Signed)
Called patient husband to let him know that I spoke with Dr. [name] about the Ammonia level and he states that Dr. Dennard Schaumann would be the best person to manage the ammonia level.  [name] is understanding of this information.

## 2017-05-29 LAB — HEMOGLOBIN A1C
HEMOGLOBIN A1C: 4.9 % (ref <5.7)
MEAN PLASMA GLUCOSE: 94 mg/dL

## 2017-06-09 ENCOUNTER — Other Ambulatory Visit (HOSPITAL_BASED_OUTPATIENT_CLINIC_OR_DEPARTMENT_OTHER): Payer: Medicare Other

## 2017-06-09 DIAGNOSIS — R7989 Other specified abnormal findings of blood chemistry: Secondary | ICD-10-CM | POA: Diagnosis not present

## 2017-06-09 DIAGNOSIS — G9340 Encephalopathy, unspecified: Secondary | ICD-10-CM | POA: Diagnosis not present

## 2017-06-10 ENCOUNTER — Encounter: Payer: Self-pay | Admitting: *Deleted

## 2017-06-10 LAB — AMMONIA: AMMONIA, PLASMA: 229 ug/dL — AB (ref 19–87)

## 2017-06-10 NOTE — Progress Notes (Unsigned)
Critical results ammonia 229 called from [facility] at 2:30 pm 06-10-2017.  Result given to Cristi Loron NP   SDD

## 2017-06-15 ENCOUNTER — Other Ambulatory Visit: Payer: Self-pay | Admitting: Family Medicine

## 2017-06-17 ENCOUNTER — Other Ambulatory Visit: Payer: Self-pay | Admitting: Family

## 2017-06-17 DIAGNOSIS — R7989 Other specified abnormal findings of blood chemistry: Secondary | ICD-10-CM

## 2017-06-17 DIAGNOSIS — G9340 Encephalopathy, unspecified: Secondary | ICD-10-CM

## 2017-06-17 LAB — AMMONIA

## 2017-07-01 ENCOUNTER — Other Ambulatory Visit: Payer: Self-pay | Admitting: Family

## 2017-07-01 DIAGNOSIS — G9340 Encephalopathy, unspecified: Secondary | ICD-10-CM

## 2017-07-01 DIAGNOSIS — R7989 Other specified abnormal findings of blood chemistry: Secondary | ICD-10-CM

## 2017-07-17 ENCOUNTER — Other Ambulatory Visit: Payer: Self-pay | Admitting: Family Medicine

## 2017-07-17 ENCOUNTER — Other Ambulatory Visit: Payer: Self-pay | Admitting: Family

## 2017-07-17 DIAGNOSIS — R7989 Other specified abnormal findings of blood chemistry: Secondary | ICD-10-CM

## 2017-07-17 DIAGNOSIS — G9340 Encephalopathy, unspecified: Secondary | ICD-10-CM

## 2017-08-04 ENCOUNTER — Other Ambulatory Visit: Payer: Self-pay | Admitting: Family

## 2017-08-04 DIAGNOSIS — R7989 Other specified abnormal findings of blood chemistry: Secondary | ICD-10-CM

## 2017-08-04 DIAGNOSIS — G9340 Encephalopathy, unspecified: Secondary | ICD-10-CM

## 2017-08-09 ENCOUNTER — Other Ambulatory Visit: Payer: Self-pay | Admitting: Family Medicine

## 2017-08-09 DIAGNOSIS — G9340 Encephalopathy, unspecified: Secondary | ICD-10-CM

## 2017-08-09 DIAGNOSIS — R7989 Other specified abnormal findings of blood chemistry: Secondary | ICD-10-CM

## 2017-08-11 DIAGNOSIS — H26492 Other secondary cataract, left eye: Secondary | ICD-10-CM | POA: Diagnosis not present

## 2017-08-11 DIAGNOSIS — H5462 Unqualified visual loss, left eye, normal vision right eye: Secondary | ICD-10-CM | POA: Diagnosis not present

## 2017-08-15 ENCOUNTER — Other Ambulatory Visit: Payer: Self-pay | Admitting: Family Medicine

## 2017-08-17 NOTE — Telephone Encounter (Signed)
Medication refilled per protocol. 

## 2017-08-20 ENCOUNTER — Encounter: Payer: Self-pay | Admitting: Hematology & Oncology

## 2017-08-20 ENCOUNTER — Ambulatory Visit (HOSPITAL_BASED_OUTPATIENT_CLINIC_OR_DEPARTMENT_OTHER): Payer: Medicare Other | Admitting: Hematology & Oncology

## 2017-08-20 ENCOUNTER — Other Ambulatory Visit: Payer: Self-pay

## 2017-08-20 ENCOUNTER — Other Ambulatory Visit (HOSPITAL_BASED_OUTPATIENT_CLINIC_OR_DEPARTMENT_OTHER): Payer: Medicare Other

## 2017-08-20 VITALS — BP 121/88 | HR 54 | Temp 97.9°F | Resp 18 | Wt 232.0 lb

## 2017-08-20 DIAGNOSIS — D696 Thrombocytopenia, unspecified: Secondary | ICD-10-CM

## 2017-08-20 DIAGNOSIS — K7581 Nonalcoholic steatohepatitis (NASH): Secondary | ICD-10-CM | POA: Diagnosis not present

## 2017-08-20 DIAGNOSIS — Z23 Encounter for immunization: Secondary | ICD-10-CM | POA: Diagnosis not present

## 2017-08-20 DIAGNOSIS — C679 Malignant neoplasm of bladder, unspecified: Secondary | ICD-10-CM | POA: Diagnosis not present

## 2017-08-20 DIAGNOSIS — D500 Iron deficiency anemia secondary to blood loss (chronic): Secondary | ICD-10-CM | POA: Diagnosis not present

## 2017-08-20 DIAGNOSIS — K7460 Unspecified cirrhosis of liver: Secondary | ICD-10-CM | POA: Diagnosis not present

## 2017-08-20 DIAGNOSIS — Z862 Personal history of diseases of the blood and blood-forming organs and certain disorders involving the immune mechanism: Secondary | ICD-10-CM

## 2017-08-20 DIAGNOSIS — C189 Malignant neoplasm of colon, unspecified: Secondary | ICD-10-CM

## 2017-08-20 DIAGNOSIS — R161 Splenomegaly, not elsewhere classified: Secondary | ICD-10-CM

## 2017-08-20 LAB — CMP (CANCER CENTER ONLY)
ALT(SGPT): 18 U/L (ref 10–47)
AST: 28 U/L (ref 11–38)
Albumin: 3 g/dL — ABNORMAL LOW (ref 3.3–5.5)
Alkaline Phosphatase: 73 U/L (ref 26–84)
BUN, Bld: 14 mg/dL (ref 7–22)
CO2: 27 meq/L (ref 18–33)
Calcium: 10 mg/dL (ref 8.0–10.3)
Chloride: 108 meq/L (ref 98–108)
Creat: 0.9 mg/dL (ref 0.6–1.2)
Glucose, Bld: 172 mg/dL — ABNORMAL HIGH (ref 73–118)
Potassium: 4.2 meq/L (ref 3.3–4.7)
Sodium: 141 meq/L (ref 128–145)
Total Bilirubin: 1.9 mg/dL — ABNORMAL HIGH (ref 0.20–1.60)
Total Protein: 5.7 g/dL — ABNORMAL LOW (ref 6.4–8.1)

## 2017-08-20 LAB — CBC WITH DIFFERENTIAL (CANCER CENTER ONLY)
BASO#: 0 10*3/uL (ref 0.0–0.2)
BASO%: 0.3 % (ref 0.0–2.0)
EOS ABS: 0.1 10*3/uL (ref 0.0–0.5)
EOS%: 2.1 % (ref 0.0–7.0)
HCT: 36.4 % (ref 34.8–46.6)
HGB: 12.4 g/dL (ref 11.6–15.9)
LYMPH#: 0.7 10*3/uL — ABNORMAL LOW (ref 0.9–3.3)
LYMPH%: 25.1 % (ref 14.0–48.0)
MCH: 32.2 pg (ref 26.0–34.0)
MCHC: 34.1 g/dL (ref 32.0–36.0)
MCV: 95 fL (ref 81–101)
MONO#: 0.3 10*3/uL (ref 0.1–0.9)
MONO%: 10.1 % (ref 0.0–13.0)
NEUT#: 1.8 10*3/uL (ref 1.5–6.5)
NEUT%: 62.4 % (ref 39.6–80.0)
Platelets: 48 10*3/uL — ABNORMAL LOW (ref 145–400)
RBC: 3.85 10*6/uL (ref 3.70–5.32)
RDW: 15.6 % (ref 11.1–15.7)
WBC: 2.9 10*3/uL — AB (ref 3.9–10.0)

## 2017-08-20 MED ORDER — INFLUENZA VAC SPLIT QUAD 0.5 ML IM SUSY
PREFILLED_SYRINGE | INTRAMUSCULAR | Status: AC
  Filled 2017-08-20: qty 0.5

## 2017-08-20 MED ORDER — INFLUENZA VAC SPLIT QUAD 0.5 ML IM SUSY
0.5000 mL | PREFILLED_SYRINGE | Freq: Once | INTRAMUSCULAR | Status: AC
Start: 2017-08-20 — End: 2017-08-20
  Administered 2017-08-20: 0.5 mL via INTRAMUSCULAR

## 2017-08-20 NOTE — Progress Notes (Signed)
Hematology and Oncology Follow Up Visit  [name] [id number] 10-Mar-1941 76 y.o. 08/20/2017   Principle Diagnosis:  1. Recurrent iron-deficiency anemia. 2. 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 3. Remote history of stage II (T3, N0, M0) adenocarcinoma of colon. 4. 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     Interim History:  Ms.  Robertson is back for followup.  She actually looks quite good.  I think she has had a relatively decent summer.  I do not think she has been hospitalized.  The last time she was hospitalized was probably in the spring.  She has had no bleeding.  She does have issues with her ammonia.  She has marked hyperammonianemia.  She is on lactulose to help with this.  Her last iron studies that we have show that she was iron deficient.  Her ferritin was 12 with an iron saturation of 17%.  She has had no fever.  She had no cough.  Her main problem is the dementia.  This seems to be holding pretty steady.  Overall, her performance status is ECOG 2-3   Medications:  Current Outpatient Medications:  .  acidophilus (RISAQUAD) CAPS capsule, Take 1 capsule by mouth daily., Disp: , Rfl:  .  Apoaequorin (PREVAGEN EXTRA STRENGTH) 20 MG CAPS, Take 20 mg by mouth daily., Disp: , Rfl:  .  Ascorbic Acid (VITAMIN C) 1000 MG tablet, Take 1,000 mg by mouth daily., Disp: , Rfl:  .  cholecalciferol (VITAMIN D) 1000 units tablet, Take 1,000 Units by mouth daily., Disp: , Rfl:  .  Cinnamon 500 MG capsule, Take 500 mg by mouth daily. , Disp: , Rfl:  .  citalopram (CELEXA) 40 MG tablet, Take 20 mg by mouth daily., Disp: , Rfl:  .  fexofenadine (ALLEGRA) 180 MG tablet, Take 180 mg by mouth daily., Disp: , Rfl:  .  GENERLAC 10 GM/15ML SOLN, TAKE 30 MLS BY MOUTH THREE TIMES DAILY, Disp: 240 mL, Rfl: 0 .  Ginkgo Biloba 60 MG TABS, Take 60 mg by mouth daily. , Disp: ,  Rfl:  .  losartan (COZAAR) 25 MG tablet, Take 25 mg by mouth daily., Disp: , Rfl:  .  Multiple Vitamin (MULTIVITAMIN WITH MINERALS) TABS tablet, Take 1 tablet by mouth daily., Disp: , Rfl:  .  omega-3 acid ethyl esters (LOVAZA) 1 g capsule, Take 1 g by mouth daily., Disp: , Rfl:  .  pantoprazole (PROTONIX) 40 MG tablet, TAKE 1 TABLET(40 MG) BY MOUTH DAILY, Disp: 30 tablet, Rfl: 1 .  spironolactone (ALDACTONE) 50 MG tablet, TAKE 1 TABLET(50 MG) BY MOUTH DAILY, Disp: 90 tablet, Rfl: 1 .  vitamin B-12 (CYANOCOBALAMIN) 1000 MCG tablet, Take 1,000 mcg by mouth daily., Disp: , Rfl:  .  vitamin E 400 UNIT capsule, Take 400 Units by mouth daily., Disp: , Rfl:   Current Facility-Administered Medications:  .  Influenza vac split quadrivalent PF (FLUARIX) injection 0.5 mL, 0.5 mL, Intramuscular, Once, Ennever, Rudell Cobb, MD  Allergies:  Allergies  Allergen Reactions  . Bee Venom Anaphylaxis  . Codeine Anaphylaxis and Rash  . Morphine And Related Anaphylaxis and Rash  . Phytonadione Other (See Comments)    Pt experienced an episode of cyanosis following dose of Vitamin K 10 mg IV.  Marland Kitchen Promethazine Hcl Other (See Comments)    Reaction:  Unknown   . Penicillins Rash and Other (See Comments)  Has patient had a PCN reaction causing immediate rash, facial/tongue/throat swelling, SOB or lightheadedness with hypotension: No Has patient had a PCN reaction causing severe rash involving mucus membranes or skin necrosis: No Has patient had a PCN reaction that required hospitalization: No Has patient had a PCN reaction occurring within the last 10 years: No If all of the above answers are "NO", then may proceed with Cephalosporin use.    Past Medical History, Surgical history, Social history, and Family History were reviewed and updated.  Review of Systems: As stated in the interim history  Physical Exam:  weight is 232 lb (105.2 kg). Her oral temperature is 97.9 F (36.6 C). Her blood pressure is 121/88  and her pulse is 54 (abnormal). Her respiration is 18 and oxygen saturation is 99%.   I examined [name].  The findings on my examination are noted below:   Lungs are clear. Cardiac exam regular rate and rhythm. She has no murmurs rubs or bruits. Abdomen is soft. She has good bowel sounds. There is no fluid wave. There is a palpable liver edge. Her spleen tip is palpable with inspiration under the left costal margin. Extremities shows no clubbing, cyanosis or edema. Back exam shows no kyphosis. There is no tenderness over the spine, ribs or hips. Skin exam shows no rashes, ecchymoses or petechia. She has some seborrheic keratoses. Neurological exam shows no focal neurological deficits. She does have the dementia which is chronic.  Lab Results  Component Value Date   WBC 2.9 (L) 08/20/2017   HGB 12.4 08/20/2017   HCT 36.4 08/20/2017   MCV 95 08/20/2017   PLT 48 (L) 08/20/2017     Chemistry      Component Value Date/Time   NA 141 08/20/2017 1315   K 4.2 08/20/2017 1315   CL 108 08/20/2017 1315   CO2 27 08/20/2017 1315   BUN 14 08/20/2017 1315   CREATININE 0.9 08/20/2017 1315      Component Value Date/Time   CALCIUM 10.0 08/20/2017 1315   ALKPHOS 73 08/20/2017 1315   AST 28 08/20/2017 1315   ALT 18 08/20/2017 1315   BILITOT 1.90 (H) 08/20/2017 1315         Impression and Plan: [name] is 76 year old female.  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  She has splenomegaly.  Her platelet count is a little bit lower.  She is totally asymptomatic right now.  As such, I do not think we have to intervene with her platelets.  Her hemoglobin is better.  She did get iron over the summer.  This has always helped.  She will get the flu shot today.  At this point, we will get her through the holidays in the wintertime.  I do not want to have her come back when the weather might be unstable.  Her dementia seems to be holding pretty steady right now.  [name],  MD 11/29/20182:43 PM

## 2017-08-21 ENCOUNTER — Telehealth: Payer: Self-pay | Admitting: *Deleted

## 2017-08-21 DIAGNOSIS — R7989 Other specified abnormal findings of blood chemistry: Secondary | ICD-10-CM

## 2017-08-21 DIAGNOSIS — G9340 Encephalopathy, unspecified: Secondary | ICD-10-CM

## 2017-08-21 LAB — IRON AND TIBC
%SAT: 40 % (ref 21–57)
Iron: 113 ug/dL (ref 41–142)
TIBC: 280 ug/dL (ref 236–444)
UIBC: 167 ug/dL (ref 120–384)

## 2017-08-21 LAB — AMMONIA: Ammonia, Plasma: 219 ug/dL — ABNORMAL HIGH (ref 19–87)

## 2017-08-21 LAB — FERRITIN: Ferritin: 20 ng/ml (ref 9–269)

## 2017-08-21 NOTE — Telephone Encounter (Addendum)
Patient's husband is aware of results. He needs a refill and clarification on dosing. Spoke with Dr [name] and new order for lactulose will be sent.   ----- Message from [name], MD sent at 08/21/2017  3:08 PM EST ----- Call her husband - the ammonia is still very high.  Keep giving her the Lactulose!!!  [name]

## 2017-08-24 ENCOUNTER — Other Ambulatory Visit: Payer: Self-pay | Admitting: Hematology & Oncology

## 2017-08-24 DIAGNOSIS — R7989 Other specified abnormal findings of blood chemistry: Secondary | ICD-10-CM

## 2017-08-24 DIAGNOSIS — G9340 Encephalopathy, unspecified: Secondary | ICD-10-CM

## 2017-08-24 MED ORDER — LACTULOSE 10 GM/15ML PO SOLN: 20.0000 g | Freq: Three times a day (TID) | ORAL | 0 refills | Status: DC

## 2017-08-25 DIAGNOSIS — Z1231 Encounter for screening mammogram for malignant neoplasm of breast: Secondary | ICD-10-CM | POA: Diagnosis not present

## 2017-08-25 DIAGNOSIS — Z803 Family history of malignant neoplasm of breast: Secondary | ICD-10-CM | POA: Diagnosis not present

## 2017-08-25 LAB — HM MAMMOGRAPHY

## 2017-08-26 ENCOUNTER — Encounter: Payer: Self-pay | Admitting: Family Medicine

## 2017-08-27 ENCOUNTER — Other Ambulatory Visit: Payer: Self-pay

## 2017-09-01 ENCOUNTER — Encounter: Payer: Medicare Other | Admitting: Family Medicine

## 2017-09-07 ENCOUNTER — Other Ambulatory Visit: Payer: Self-pay | Admitting: Family Medicine

## 2017-09-07 DIAGNOSIS — Z79899 Other long term (current) drug therapy: Secondary | ICD-10-CM

## 2017-09-07 DIAGNOSIS — K7460 Unspecified cirrhosis of liver: Secondary | ICD-10-CM

## 2017-09-07 DIAGNOSIS — F0390 Unspecified dementia without behavioral disturbance: Secondary | ICD-10-CM

## 2017-09-07 DIAGNOSIS — I10 Essential (primary) hypertension: Secondary | ICD-10-CM

## 2017-09-08 ENCOUNTER — Other Ambulatory Visit: Payer: Medicare Other

## 2017-09-08 DIAGNOSIS — Z79899 Other long term (current) drug therapy: Secondary | ICD-10-CM

## 2017-09-08 DIAGNOSIS — K7460 Unspecified cirrhosis of liver: Secondary | ICD-10-CM

## 2017-09-08 DIAGNOSIS — I10 Essential (primary) hypertension: Secondary | ICD-10-CM

## 2017-09-08 DIAGNOSIS — F0390 Unspecified dementia without behavioral disturbance: Secondary | ICD-10-CM | POA: Diagnosis not present

## 2017-09-08 LAB — CBC WITH DIFFERENTIAL/PLATELET
BASOS PCT: 0.8 %
Basophils Absolute: 21 cells/uL (ref 0–200)
EOS ABS: 62 {cells}/uL (ref 15–500)
Eosinophils Relative: 2.4 %
HEMATOCRIT: 35 % (ref 35.0–45.0)
HEMOGLOBIN: 11.8 g/dL (ref 11.7–15.5)
Lymphs Abs: 785 cells/uL — ABNORMAL LOW (ref 850–3900)
MCH: 31.6 pg (ref 27.0–33.0)
MCHC: 33.7 g/dL (ref 32.0–36.0)
MCV: 93.6 fL (ref 80.0–100.0)
MPV: 12.8 fL — AB (ref 7.5–12.5)
Monocytes Relative: 10.2 %
NEUTROS ABS: 1466 {cells}/uL — AB (ref 1500–7800)
Neutrophils Relative %: 56.4 %
Platelets: 55 10*3/uL — ABNORMAL LOW (ref 140–400)
RBC: 3.74 10*6/uL — ABNORMAL LOW (ref 3.80–5.10)
RDW: 14.2 % (ref 11.0–15.0)
Total Lymphocyte: 30.2 %
WBC: 2.6 10*3/uL — ABNORMAL LOW (ref 3.8–10.8)
WBCMIX: 265 {cells}/uL (ref 200–950)

## 2017-09-08 LAB — COMPLETE METABOLIC PANEL WITH GFR
AG Ratio: 1.4 (calc) (ref 1.0–2.5)
ALT: 13 U/L (ref 6–29)
AST: 21 U/L (ref 10–35)
Albumin: 3.2 g/dL — ABNORMAL LOW (ref 3.6–5.1)
Alkaline phosphatase (APISO): 65 U/L (ref 33–130)
BUN: 18 mg/dL (ref 7–25)
CO2: 28 mmol/L (ref 20–32)
CREATININE: 0.84 mg/dL (ref 0.60–0.93)
Calcium: 9.9 mg/dL (ref 8.6–10.4)
Chloride: 110 mmol/L (ref 98–110)
GFR, Est African American: 77 mL/min/{1.73_m2} (ref >=60)
GFR, Est Non African American: 67 mL/min/{1.73_m2} (ref >=60)
GLUCOSE: 99 mg/dL (ref 65–99)
Globulin: 2.3 g/dL (calc) (ref 1.9–3.7)
Potassium: 4.6 mmol/L (ref 3.5–5.3)
Sodium: 144 mmol/L (ref 135–146)
Total Bilirubin: 1.6 mg/dL — ABNORMAL HIGH (ref 0.2–1.2)
Total Protein: 5.5 g/dL — ABNORMAL LOW (ref 6.1–8.1)

## 2017-09-08 LAB — LIPID PANEL
CHOLESTEROL: 130 mg/dL (ref <200)
HDL: 49 mg/dL — AB (ref >50)
LDL Cholesterol (Calc): 67 mg/dL (calc)
Non-HDL Cholesterol (Calc): 81 mg/dL (calc) (ref <130)
Total CHOL/HDL Ratio: 2.7 (calc) (ref <5.0)
Triglycerides: 62 mg/dL (ref <150)

## 2017-09-10 ENCOUNTER — Ambulatory Visit (INDEPENDENT_AMBULATORY_CARE_PROVIDER_SITE_OTHER): Payer: Medicare Other | Admitting: Family Medicine

## 2017-09-10 ENCOUNTER — Encounter: Payer: Self-pay | Admitting: Family Medicine

## 2017-09-10 VITALS — BP 130/58 | HR 58 | Temp 98.1°F | Resp 18 | Ht 65.0 in | Wt 231.0 lb

## 2017-09-10 DIAGNOSIS — F0391 Unspecified dementia with behavioral disturbance: Secondary | ICD-10-CM | POA: Diagnosis not present

## 2017-09-10 DIAGNOSIS — D61818 Other pancytopenia: Secondary | ICD-10-CM

## 2017-09-10 DIAGNOSIS — K7460 Unspecified cirrhosis of liver: Secondary | ICD-10-CM | POA: Diagnosis not present

## 2017-09-10 DIAGNOSIS — Z0000 Encounter for general adult medical examination without abnormal findings: Secondary | ICD-10-CM

## 2017-09-10 DIAGNOSIS — Z780 Asymptomatic menopausal state: Secondary | ICD-10-CM

## 2017-09-11 NOTE — Progress Notes (Signed)
Subjective:    Patient ID: [name], female    DOB: 1941-04-15, 76 y.o.   MRN: [id number]  HPI   Patient is here today for  complete physical exam. She has a very complicated past medical history. #1 she has a history of colon cancer. In 2001 she underwent a hemicolectomy for colon cancer. Her last colonoscopy was less than 2 years ago. She has an appointment with Dr. [name] in the near future for follow-up. She also has a history of bladder cancer. Her bladder was removed in 2008. She wears a bag chronically. She follows up every 6 months with Dr. Jeffie Pollock.  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 This has caused leukopenia with a white count between 2 and 3, thrombocytopenia. She also has occasional encephalopathy secondary to cirrhosis with an elevated ammonia level. She supposed to be using lactulose 2-3 times a day to ensure 2-3 bowel movements a day to help prevent encephalopathy. Husband has not been giving her lactulose due to fecal incontinence. I had a long discussion with him regarding the role of lactulose. I explained to him for the medicine to work she needs to titrate the lactulose to achieve 2-3 bowel movements a day.  He will gradually increase the dose of lactulose as tolerated to try to achieve 2-3 bowel movements a day now that he understands how the medication works. She also has a history of iron deficiency anemia. She follows up regularly with Dr. [name] for iron transfusions.  Colonoscopy is up-to-date. She does not require a Pap smear due to age and the fact she's had a hysterectomy. Mammogram is up-to-date  Immunization History  Administered Date(s) Administered  . Influenza, Quadrivalent, Recombinant, Inj, Pf 09/01/2013  . Influenza,inj,Quad PF,6+ Mos 05/31/2015, 09/09/2016, 08/20/2017  . Influenza,trivalent, recombinat, inj, PF 07/05/2014  . Influenza-Unspecified 06/10/2012, 09/01/2013, 07/05/2014  . Pneumococcal  Conjugate-13 06/18/2015  . Pneumococcal Polysaccharide-23 12/07/2009   Husband states that she has had a shingles vaccine in the past.  She does appear to be due for a bone density test Past Medical History:  Diagnosis Date  . Anemia, iron deficiency   . Arthritis   . Bladder cancer [facility]) 2003   Bladder removed - [name]  . Cataract immature   . Cirrhosis ([place]) 08/26/2011  . Colon cancer [facility]) 2001   Hemicolectomy - [name]  . Depression   . GERD (gastroesophageal reflux disease)   . Headache(784.0)   . Hemorrhoids   . History of colon polyps   . History of DVT (deep vein thrombosis) 50+yrs ago   With pregnancy  . History of GI bleed   . History of kidney stones   . History of urostomy    Urostomy d/t hx bladder cancer  . Hx of transfusion of whole blood   . Hypertension   . Joint pain   . Memory loss    Chemotherapy; short and long term  . Osteoporosis   . Thrombocytopenia ([place])    Past Surgical History:  Procedure Laterality Date  . ABDOMINAL HYSTERECTOMY    . APPENDECTOMY    . BLADDER SURGERY     bladder removed d/t cancer  . CHOLECYSTECTOMY    . COLON RESECTION     with colostomy then colostomy reversal  . COLON SURGERY  2004  . COLON SURGERY  2001  . COLONOSCOPY    . ESOPHAGOGASTRODUODENOSCOPY  02/27/2012   Procedure: ESOPHAGOGASTRODUODENOSCOPY (EGD);  Surgeon: [name],  MD;  Location: [facility] ENDOSCOPY;  Service: Endoscopy;  Laterality: N/A;  bedside case  . NEPHROLITHOTOMY Left 05/04/2014   Procedure: LEFT PERCUTANEOUS NEPHROLITHOTOMY ;  Surgeon: [name], MD;  Location: [facility] ORS;  Service: Urology;  Laterality: Left;  . TOTAL KNEE ARTHROPLASTY  2010   Left knee   Current Outpatient Medications on File Prior to Visit  Medication Sig Dispense Refill  . acidophilus (RISAQUAD) CAPS capsule Take 1 capsule by mouth daily.    Marland Kitchen Apoaequorin (PREVAGEN EXTRA STRENGTH) 20 MG CAPS Take 20 mg by mouth daily.    . Ascorbic Acid (VITAMIN C) 1000 MG tablet Take 1,000  mg by mouth daily.    . cholecalciferol (VITAMIN D) 1000 units tablet Take 1,000 Units by mouth daily.    . Cinnamon 500 MG capsule Take 500 mg by mouth daily.     . citalopram (CELEXA) 40 MG tablet Take 20 mg by mouth daily.    . fexofenadine (ALLEGRA) 180 MG tablet Take 180 mg by mouth daily.    Marland Kitchen GENERLAC 10 GM/15ML SOLN TAKE 30 MLS BY MOUTH THREE TIMES DAILY 240 mL 0  . Ginkgo Biloba 60 MG TABS Take 60 mg by mouth daily.     Marland Kitchen lactulose (CHRONULAC) 10 GM/15ML solution TAKE 30 MLS BY MOUTH THREE TIMES DAILY 01751 mL 0  . losartan (COZAAR) 25 MG tablet Take 25 mg by mouth daily.    . Multiple Vitamin (MULTIVITAMIN WITH MINERALS) TABS tablet Take 1 tablet by mouth daily.    Marland Kitchen omega-3 acid ethyl esters (LOVAZA) 1 g capsule Take 1 g by mouth daily.    . pantoprazole (PROTONIX) 40 MG tablet TAKE 1 TABLET(40 MG) BY MOUTH DAILY 30 tablet 1  . spironolactone (ALDACTONE) 50 MG tablet TAKE 1 TABLET(50 MG) BY MOUTH DAILY 90 tablet 1  . vitamin B-12 (CYANOCOBALAMIN) 1000 MCG tablet Take 1,000 mcg by mouth daily.    . vitamin E 400 UNIT capsule Take 400 Units by mouth daily.     No current facility-administered medications on file prior to visit.    Allergies  Allergen Reactions  . Bee Venom Anaphylaxis  . Codeine Anaphylaxis and Rash  . Morphine And Related Anaphylaxis and Rash  . Phytonadione Other (See Comments)    Pt experienced an episode of cyanosis following dose of Vitamin K 10 mg IV.  Marland Kitchen Promethazine Hcl Other (See Comments)    Reaction:  Unknown   . Penicillins Rash and Other (See Comments)    Has patient had a PCN reaction causing immediate rash, facial/tongue/throat swelling, SOB or lightheadedness with hypotension: No Has patient had a PCN reaction causing severe rash involving mucus membranes or skin necrosis: No Has patient had a PCN reaction that required hospitalization: No Has patient had a PCN reaction occurring within the last 10 years: No If all of the above answers are  "NO", then may proceed with Cephalosporin use.   Social History   Socioeconomic History  . Marital status: Married    Spouse name: Not on file  . Number of children: Not on file  . Years of education: Not on file  . Highest education level: Not on file  Social Needs  . Financial resource strain: Not on file  . Food insecurity - worry: Not on file  . Food insecurity - inability: Not on file  . Transportation needs - medical: Not on file  . Transportation needs - non-medical: Not on file  Occupational History  . Not on file  Tobacco Use  . Smoking status: Former Smoker    Packs/day: 0.50    Years: 35.00    Pack years: 17.50    Types: Cigarettes    Start date: 10/25/1955    Last attempt to quit: 09/22/1991    Years since quitting: 25.9  . Smokeless tobacco: Never Used  . Tobacco comment: quit 23years ago  Substance and Sexual Activity  . Alcohol use: No    Alcohol/week: 0.0 oz  . Drug use: No  . Sexual activity: Yes    Birth control/protection: Surgical  Other Topics Concern  . Not on file  Social History Narrative   Lives at home with her husband, ambulatory   Family History  Problem Relation Age of Onset  . Diabetes Brother   . Cancer Other   . Hypertension Other       Review of Systems  All other systems reviewed and are negative.      Objective:   Physical Exam  Constitutional: She appears well-developed and well-nourished. No distress.  HENT:  Nose: Nose normal.  Mouth/Throat: Oropharynx is clear and moist.  Eyes: Conjunctivae are normal. Right eye exhibits no discharge. Left eye exhibits no discharge. No scleral icterus.  Neck: Neck supple. No thyromegaly present.  Cardiovascular: Normal rate and regular rhythm.  Murmur heard. Pulmonary/Chest: Effort normal and breath sounds normal. No respiratory distress. She has no wheezes. She has no rales.  Abdominal: Soft. Bowel sounds are normal. She exhibits no distension and no mass. There is no tenderness.  There is no rebound and no guarding.  Musculoskeletal: Normal range of motion. She exhibits no edema or tenderness.  Lymphadenopathy:    She has no cervical adenopathy.  Neurological: She is alert. She has normal reflexes. No cranial nerve deficit. She exhibits normal muscle tone. Coordination normal.  Skin: She is not diaphoretic.  Psychiatric: Cognition and memory are impaired.  Vitals reviewed.         Assessment & Plan:  General medical exam  Dementia with behavioral disturbance, unspecified dementia type  Pancytopenia (HCC)  Cirrhosis of liver without ascites, unspecified hepatic cirrhosis type ([place])  Postmenopausal estrogen deficiency - Plan: DG Bone Density  Her immunizations are up-to-date. Cancer screening is up-to-date. She is following up with her gastroenterologist to monitor the mass on her liver. I did recommend a bone density that she is at high fall risk due to her dementia and confusion. Examination today does reveal cerumen impactions bilaterally there is likely causing her ears to pop. Cerumen impaction was removed with irrigation on the left side. Patient tolerated procedure well without complication. Spent more than 15 minutes discussing hepatic encephalopathy and the role of lactulose and treatment for this. Husband will try to uptitrate the dose of lactulose as needed to achieve 2-3 bowel movements a day. At the present time she is having less than 1 bowel movement a day and I believe this can probably explain the persistently elevated ammonia levels.

## 2017-09-22 ENCOUNTER — Other Ambulatory Visit: Payer: Self-pay | Admitting: Family Medicine

## 2017-09-29 DIAGNOSIS — Z961 Presence of intraocular lens: Secondary | ICD-10-CM | POA: Diagnosis not present

## 2017-09-29 DIAGNOSIS — H26493 Other secondary cataract, bilateral: Secondary | ICD-10-CM | POA: Diagnosis not present

## 2017-09-29 DIAGNOSIS — H18413 Arcus senilis, bilateral: Secondary | ICD-10-CM | POA: Diagnosis not present

## 2017-09-29 DIAGNOSIS — H26492 Other secondary cataract, left eye: Secondary | ICD-10-CM | POA: Diagnosis not present

## 2017-09-29 DIAGNOSIS — H02839 Dermatochalasis of unspecified eye, unspecified eyelid: Secondary | ICD-10-CM | POA: Diagnosis not present

## 2017-10-13 ENCOUNTER — Other Ambulatory Visit: Payer: Self-pay | Admitting: Family Medicine

## 2017-10-20 ENCOUNTER — Other Ambulatory Visit: Payer: Self-pay | Admitting: Gastroenterology

## 2017-10-20 DIAGNOSIS — K7469 Other cirrhosis of liver: Secondary | ICD-10-CM

## 2017-10-20 DIAGNOSIS — K7460 Unspecified cirrhosis of liver: Secondary | ICD-10-CM | POA: Diagnosis not present

## 2017-10-20 DIAGNOSIS — F09 Unspecified mental disorder due to known physiological condition: Secondary | ICD-10-CM | POA: Diagnosis not present

## 2017-10-22 ENCOUNTER — Encounter: Payer: Self-pay | Admitting: Family Medicine

## 2017-10-22 DIAGNOSIS — M8589 Other specified disorders of bone density and structure, multiple sites: Secondary | ICD-10-CM | POA: Diagnosis not present

## 2017-10-22 DIAGNOSIS — Z780 Asymptomatic menopausal state: Secondary | ICD-10-CM | POA: Diagnosis not present

## 2017-11-12 ENCOUNTER — Other Ambulatory Visit: Payer: Self-pay | Admitting: Family Medicine

## 2017-11-20 ENCOUNTER — Ambulatory Visit
Admission: RE | Admit: 2017-11-20 | Discharge: 2017-11-20 | Disposition: A | Discharge status: Home / Self Care | Payer: Medicare Other | Source: Ambulatory Visit | Attending: Gastroenterology | Admitting: Gastroenterology

## 2017-11-20 DIAGNOSIS — K7469 Other cirrhosis of liver: Secondary | ICD-10-CM

## 2017-11-20 DIAGNOSIS — K7460 Unspecified cirrhosis of liver: Secondary | ICD-10-CM | POA: Diagnosis not present

## 2017-12-09 ENCOUNTER — Telehealth: Payer: Self-pay | Admitting: Family Medicine

## 2017-12-09 ENCOUNTER — Encounter: Payer: Self-pay | Admitting: Family Medicine

## 2017-12-09 NOTE — Telephone Encounter (Signed)
Per Dr. Dennard Schaumann bone density shows osteopenia and pt should be taking calcium 1216m a day along with vit d 1000 units a day. Letter sent to pt informing her of results and recommendations.

## 2017-12-12 ENCOUNTER — Other Ambulatory Visit: Payer: Self-pay | Admitting: Family Medicine

## 2017-12-14 ENCOUNTER — Encounter: Payer: Self-pay | Admitting: Family Medicine

## 2017-12-17 ENCOUNTER — Inpatient Hospital Stay: Payer: Medicare Other | Attending: Hematology & Oncology | Admitting: Hematology & Oncology

## 2017-12-17 ENCOUNTER — Inpatient Hospital Stay: Payer: Medicare Other

## 2017-12-17 VITALS — BP 138/64 | HR 54 | Temp 98.1°F | Resp 20 | Wt 230.0 lb

## 2017-12-17 DIAGNOSIS — Z79899 Other long term (current) drug therapy: Secondary | ICD-10-CM | POA: Diagnosis not present

## 2017-12-17 DIAGNOSIS — K7460 Unspecified cirrhosis of liver: Secondary | ICD-10-CM | POA: Diagnosis not present

## 2017-12-17 DIAGNOSIS — Z85038 Personal history of other malignant neoplasm of large intestine: Secondary | ICD-10-CM | POA: Diagnosis not present

## 2017-12-17 DIAGNOSIS — D509 Iron deficiency anemia, unspecified: Secondary | ICD-10-CM | POA: Insufficient documentation

## 2017-12-17 DIAGNOSIS — C679 Malignant neoplasm of bladder, unspecified: Secondary | ICD-10-CM | POA: Insufficient documentation

## 2017-12-17 DIAGNOSIS — F0390 Unspecified dementia without behavioral disturbance: Secondary | ICD-10-CM | POA: Insufficient documentation

## 2017-12-17 DIAGNOSIS — D72819 Decreased white blood cell count, unspecified: Secondary | ICD-10-CM | POA: Diagnosis not present

## 2017-12-17 DIAGNOSIS — K7581 Nonalcoholic steatohepatitis (NASH): Secondary | ICD-10-CM

## 2017-12-17 DIAGNOSIS — D500 Iron deficiency anemia secondary to blood loss (chronic): Secondary | ICD-10-CM

## 2017-12-17 DIAGNOSIS — D6959 Other secondary thrombocytopenia: Secondary | ICD-10-CM

## 2017-12-17 LAB — IRON AND TIBC
Iron: 67 ug/dL (ref 41–142)
SATURATION RATIOS: 24 % (ref 21–57)
TIBC: 281 ug/dL (ref 236–444)
UIBC: 215 ug/dL

## 2017-12-17 LAB — CMP (CANCER CENTER ONLY)
ALBUMIN: 3 g/dL — AB (ref 3.5–5.0)
ALK PHOS: 67 U/L (ref 26–84)
ALT: 19 U/L (ref 10–47)
AST: 30 U/L (ref 11–38)
Anion gap: 4 — ABNORMAL LOW (ref 5–15)
BUN: 16 mg/dL (ref 7–22)
CO2: 30 mmol/L (ref 18–33)
CREATININE: 0.5 mg/dL — AB (ref 0.60–1.20)
Calcium: 9.7 mg/dL (ref 8.0–10.3)
Chloride: 105 mmol/L (ref 98–108)
GLUCOSE: 178 mg/dL — AB (ref 73–118)
POTASSIUM: 3.6 mmol/L (ref 3.3–4.7)
SODIUM: 139 mmol/L (ref 128–145)
Total Bilirubin: 1.7 mg/dL — ABNORMAL HIGH (ref 0.2–1.6)
Total Protein: 5.7 g/dL — ABNORMAL LOW (ref 6.4–8.1)

## 2017-12-17 LAB — CBC WITH DIFFERENTIAL (CANCER CENTER ONLY)
Basophils Absolute: 0 10*3/uL (ref 0.0–0.1)
Basophils Relative: 0 %
Eosinophils Absolute: 0.1 10*3/uL (ref 0.0–0.5)
Eosinophils Relative: 3 %
HEMATOCRIT: 33.4 % — AB (ref 34.8–46.6)
HEMOGLOBIN: 11.2 g/dL — AB (ref 11.6–15.9)
LYMPHS ABS: 0.6 10*3/uL — AB (ref 0.9–3.3)
LYMPHS PCT: 23 %
MCH: 32 pg (ref 26.0–34.0)
MCHC: 33.5 g/dL (ref 32.0–36.0)
MCV: 95.4 fL (ref 81.0–101.0)
Monocytes Absolute: 0.3 10*3/uL (ref 0.1–0.9)
Monocytes Relative: 11 %
NEUTROS PCT: 63 %
Neutro Abs: 1.6 10*3/uL (ref 1.5–6.5)
Platelet Count: 43 10*3/uL — ABNORMAL LOW (ref 145–400)
RBC: 3.5 MIL/uL — AB (ref 3.70–5.32)
RDW: 16 % — ABNORMAL HIGH (ref 11.1–15.7)
WBC: 2.5 10*3/uL — AB (ref 3.9–10.0)

## 2017-12-17 LAB — AMMONIA: AMMONIA: 71 umol/L — AB (ref 9–35)

## 2017-12-17 LAB — FERRITIN: Ferritin: 18 ng/mL (ref 9–269)

## 2017-12-17 NOTE — Progress Notes (Signed)
Hematology and Oncology Follow Up Visit  [name] [id number] November 03, 1940 77 y.o. 12/17/2017   Principle Diagnosis:  1. Recurrent iron-deficiency anemia. 2. 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 3. Remote history of stage II (T3, N0, M0) adenocarcinoma of colon. 4. 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     Interim History:  Ms.  Robertson is back for followup.  She actually looks quite good.  I think she has had a relatively decent Christmas and winter.  He has been I do not think she has been hospitalized.  The last time she was hospitalized was probably in the summer 2018  She has had no bleeding.  She does have issues with her ammonia.  She has marked hyperammonianemia.  She is on lactulose to help with this.  Her last iron studies that we have back in November showed a ferritin of 20 with an iron saturation of 40%.    She has had no fever.  She had no cough.  Her main problem is the dementia.  This seems to be holding pretty steady.  Overall, her performance status is ECOG 2-3   Medications:  Current Outpatient Medications:  .  acidophilus (RISAQUAD) CAPS capsule, Take 1 capsule by mouth daily., Disp: , Rfl:  .  Apoaequorin (PREVAGEN EXTRA STRENGTH) 20 MG CAPS, Take 20 mg by mouth daily., Disp: , Rfl:  .  Ascorbic Acid (VITAMIN C) 1000 MG tablet, Take 1,000 mg by mouth daily., Disp: , Rfl:  .  cholecalciferol (VITAMIN D) 1000 units tablet, Take 1,000 Units by mouth daily., Disp: , Rfl:  .  Cinnamon 500 MG capsule, Take 500 mg by mouth daily. , Disp: , Rfl:  .  citalopram (CELEXA) 40 MG tablet, TAKE 1 TABLET BY MOUTH EVERY DAY, Disp: 90 tablet, Rfl: 0 .  fexofenadine (ALLEGRA) 180 MG tablet, Take 180 mg by mouth daily., Disp: , Rfl:  .  GENERLAC 10 GM/15ML SOLN, TAKE 30 MLS BY MOUTH THREE TIMES DAILY, Disp: 240 mL, Rfl: 0 .  Ginkgo Biloba 60 MG TABS,  Take 60 mg by mouth daily. , Disp: , Rfl:  .  lactulose (CHRONULAC) 10 GM/15ML solution, TAKE 30 MLS BY MOUTH THREE TIMES DAILY, Disp: 10800 mL, Rfl: 0 .  losartan (COZAAR) 25 MG tablet, Take 25 mg by mouth daily., Disp: , Rfl:  .  Multiple Vitamin (MULTIVITAMIN WITH MINERALS) TABS tablet, Take 1 tablet by mouth daily., Disp: , Rfl:  .  omega-3 acid ethyl esters (LOVAZA) 1 g capsule, Take 1 g by mouth daily., Disp: , Rfl:  .  pantoprazole (PROTONIX) 40 MG tablet, TAKE 1 TABLET(40 MG) BY MOUTH DAILY, Disp: 90 tablet, Rfl: 3 .  spironolactone (ALDACTONE) 50 MG tablet, TAKE 1 TABLET(50 MG) BY MOUTH DAILY, Disp: 90 tablet, Rfl: 1 .  vitamin B-12 (CYANOCOBALAMIN) 1000 MCG tablet, Take 1,000 mcg by mouth daily., Disp: , Rfl:  .  vitamin E 400 UNIT capsule, Take 400 Units by mouth daily., Disp: , Rfl:   Allergies:  Allergies  Allergen Reactions  . Bee Venom Anaphylaxis  . Codeine Anaphylaxis and Rash  . Morphine And Related Anaphylaxis and Rash  . Phytonadione Other (See Comments)    Pt experienced an episode of cyanosis following dose of Vitamin K 10 mg IV.  Marland Kitchen Promethazine Hcl Other (See Comments)    Reaction:  Unknown   . Penicillins Rash and  Other (See Comments)    Has patient had a PCN reaction causing immediate rash, facial/tongue/throat swelling, SOB or lightheadedness with hypotension: No Has patient had a PCN reaction causing severe rash involving mucus membranes or skin necrosis: No Has patient had a PCN reaction that required hospitalization: No Has patient had a PCN reaction occurring within the last 10 years: No If all of the above answers are "NO", then may proceed with Cephalosporin use.    Past Medical History, Surgical history, Social history, and Family History were reviewed and updated.  Review of Systems: As stated in the interim history  Physical Exam:  weight is 230 lb (104.3 kg). Her oral temperature is 98.1 F (36.7 C). Her blood pressure is 138/64 and her pulse is  54 (abnormal). Her respiration is 20 and oxygen saturation is 98%.   I examined [name].  The findings on my examination are noted below:   Lungs are clear. Cardiac exam regular rate and rhythm. She has no murmurs rubs or bruits. Abdomen is soft. She has good bowel sounds. There is no fluid wave. There is a palpable liver edge. Her spleen tip is palpable with inspiration under the left costal margin. Extremities shows no clubbing, cyanosis or edema. Back exam shows no kyphosis. There is no tenderness over the spine, ribs or hips. Skin exam shows no rashes, ecchymoses or petechia. She has some seborrheic keratoses. Neurological exam shows no focal neurological deficits. She does have the dementia which is chronic.  Lab Results  Component Value Date   WBC 2.5 (L) 12/17/2017   HGB 11.8 09/08/2017   HCT 33.4 (L) 12/17/2017   MCV 95.4 12/17/2017   PLT 43 (L) 12/17/2017     Chemistry      Component Value Date/Time   NA 139 12/17/2017 1130   NA 141 08/20/2017 1315   K 3.6 12/17/2017 1130   K 4.2 08/20/2017 1315   CL 105 12/17/2017 1130   CL 108 08/20/2017 1315   CO2 30 12/17/2017 1130   CO2 27 08/20/2017 1315   BUN 16 12/17/2017 1130   BUN 14 08/20/2017 1315   CREATININE 0.50 (L) 12/17/2017 1130   CREATININE 0.84 09/08/2017 1038      Component Value Date/Time   CALCIUM 9.7 12/17/2017 1130   CALCIUM 10.0 08/20/2017 1315   ALKPHOS 67 12/17/2017 1130   ALKPHOS 73 08/20/2017 1315   AST 30 12/17/2017 1130   ALT 19 12/17/2017 1130   ALT 18 08/20/2017 1315   BILITOT 1.7 (H) 12/17/2017 1130         Impression and Plan: Ms. Cullifer is 77 year old female.  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  She has splenomegaly.  Her platelet count is a little bit lower.  She is totally asymptomatic right now.  As such, I do not think we have to intervene with her platelets.  Her hemoglobin is better.  She did get iron over the summer.  This has always helped.  Her dementia seems to be holding  pretty steady right now.  We will plan to get her back to see [place] in another 4 months.  [name], MD 3/28/201912:23 PM

## 2018-02-24 ENCOUNTER — Other Ambulatory Visit: Payer: Self-pay | Admitting: Hematology & Oncology

## 2018-02-24 ENCOUNTER — Other Ambulatory Visit: Payer: Self-pay | Admitting: Family Medicine

## 2018-02-24 DIAGNOSIS — D696 Thrombocytopenia, unspecified: Secondary | ICD-10-CM

## 2018-02-24 DIAGNOSIS — K7460 Unspecified cirrhosis of liver: Secondary | ICD-10-CM

## 2018-02-24 DIAGNOSIS — D500 Iron deficiency anemia secondary to blood loss (chronic): Secondary | ICD-10-CM

## 2018-03-02 IMAGING — CR DG CHEST 2V
2 series · 2 of 2 positions shown · non-contrast
Comparison: 07/11/2016

CLINICAL DATA: Sudden onset severe confusion today. History of
bladder cancer, colon cancer, dementia, and hypertension. Back pain
and abdominal pain for 2 weeks. Weakness.

EXAM:
CHEST  2 VIEW

[w chest lat]
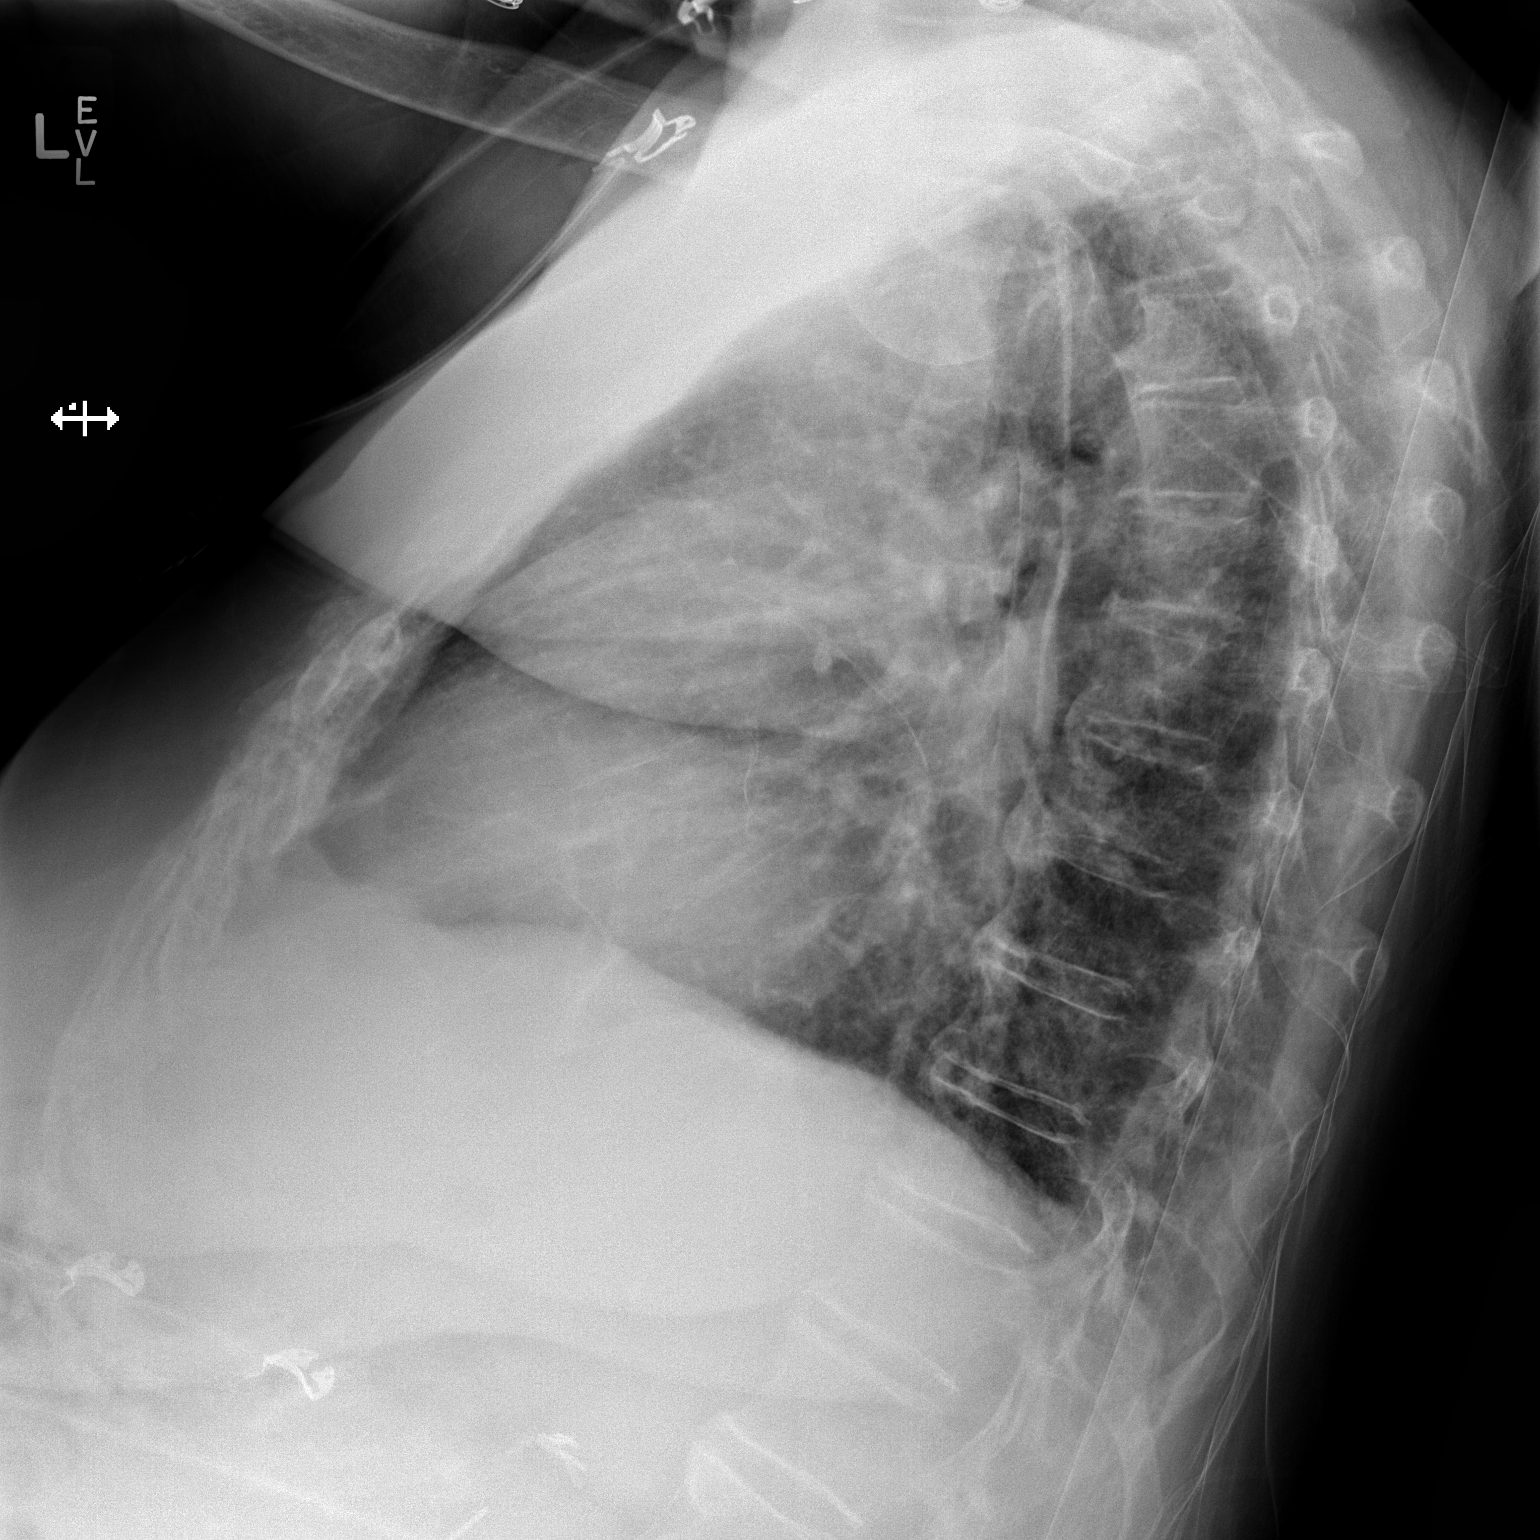

[x chest ap]
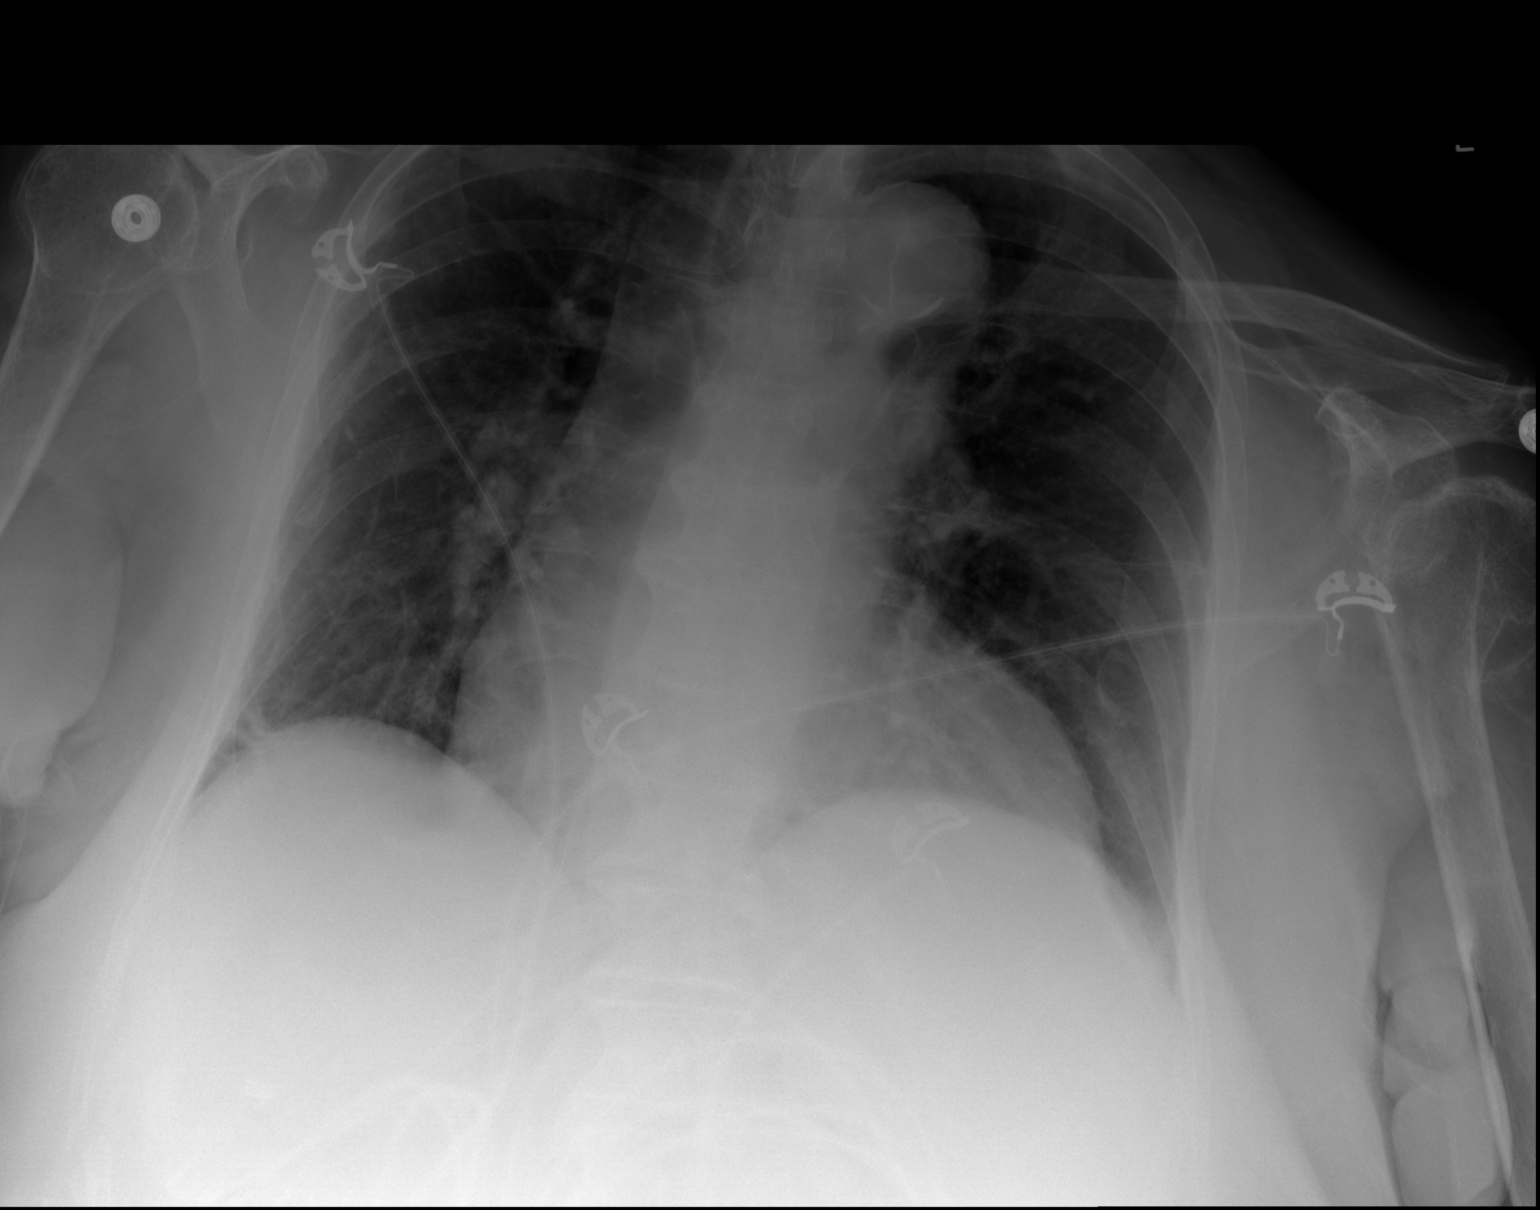

[2 of 2 positions shown; findings below may reference images not displayed]

FINDINGS: Shallow inspiration with linear atelectasis or fibrosis in the lung
bases similar to prior study. No focal airspace disease or
consolidation. Cardiac enlargement without vascular congestion or
edema. Calcified aorta. No pneumothorax. No pleural effusions.
Degenerative changes in the spine. Surgical clips in the right upper
quadrant.
IMPRESSION: Shallow inspiration with atelectasis or infiltration in the lung
bases similar to prior study. No focal consolidation. Cardiac
enlargement.

## 2018-03-09 ENCOUNTER — Other Ambulatory Visit: Payer: Self-pay | Admitting: Family Medicine

## 2018-03-09 DIAGNOSIS — I10 Essential (primary) hypertension: Secondary | ICD-10-CM

## 2018-04-08 DIAGNOSIS — D3002 Benign neoplasm of left kidney: Secondary | ICD-10-CM | POA: Diagnosis not present

## 2018-04-08 DIAGNOSIS — N200 Calculus of kidney: Secondary | ICD-10-CM | POA: Diagnosis not present

## 2018-04-15 ENCOUNTER — Inpatient Hospital Stay: Payer: Medicare Other

## 2018-04-15 ENCOUNTER — Encounter: Payer: Self-pay | Admitting: Hematology & Oncology

## 2018-04-15 ENCOUNTER — Inpatient Hospital Stay: Payer: Medicare Other | Attending: Hematology & Oncology | Admitting: Hematology & Oncology

## 2018-04-15 ENCOUNTER — Other Ambulatory Visit: Payer: Self-pay

## 2018-04-15 VITALS — BP 106/64 | HR 51 | Temp 98.3°F | Resp 16 | Wt 225.0 lb

## 2018-04-15 DIAGNOSIS — F0390 Unspecified dementia without behavioral disturbance: Secondary | ICD-10-CM | POA: Insufficient documentation

## 2018-04-15 DIAGNOSIS — D6959 Other secondary thrombocytopenia: Secondary | ICD-10-CM | POA: Diagnosis not present

## 2018-04-15 DIAGNOSIS — D72819 Decreased white blood cell count, unspecified: Secondary | ICD-10-CM | POA: Diagnosis not present

## 2018-04-15 DIAGNOSIS — R161 Splenomegaly, not elsewhere classified: Secondary | ICD-10-CM | POA: Insufficient documentation

## 2018-04-15 DIAGNOSIS — D509 Iron deficiency anemia, unspecified: Secondary | ICD-10-CM | POA: Diagnosis present

## 2018-04-15 DIAGNOSIS — K7581 Nonalcoholic steatohepatitis (NASH): Secondary | ICD-10-CM | POA: Diagnosis not present

## 2018-04-15 DIAGNOSIS — D500 Iron deficiency anemia secondary to blood loss (chronic): Secondary | ICD-10-CM

## 2018-04-15 DIAGNOSIS — C182 Malignant neoplasm of ascending colon: Secondary | ICD-10-CM

## 2018-04-15 DIAGNOSIS — Z85038 Personal history of other malignant neoplasm of large intestine: Secondary | ICD-10-CM | POA: Diagnosis not present

## 2018-04-15 DIAGNOSIS — Z8551 Personal history of malignant neoplasm of bladder: Secondary | ICD-10-CM

## 2018-04-15 DIAGNOSIS — F0391 Unspecified dementia with behavioral disturbance: Secondary | ICD-10-CM

## 2018-04-15 LAB — CMP (CANCER CENTER ONLY)
ALBUMIN: 3.2 g/dL — AB (ref 3.5–5.0)
ALT: 13 U/L (ref 0–44)
ANION GAP: 8 (ref 5–15)
AST: 23 U/L (ref 15–41)
Alkaline Phosphatase: 72 U/L (ref 38–126)
BILIRUBIN TOTAL: 1.4 mg/dL — AB (ref 0.3–1.2)
BUN: 15 mg/dL (ref 8–23)
CO2: 25 mmol/L (ref 22–32)
Calcium: 10 mg/dL (ref 8.9–10.3)
Chloride: 107 mmol/L (ref 98–111)
Creatinine: 0.84 mg/dL (ref 0.44–1.00)
GFR, Est AFR Am: >60 mL/min (ref >60)
GFR, Estimated: >60 mL/min (ref >60)
Glucose, Bld: 140 mg/dL — ABNORMAL HIGH (ref 70–99)
POTASSIUM: 4.2 mmol/L (ref 3.5–5.1)
SODIUM: 140 mmol/L (ref 135–145)
TOTAL PROTEIN: 5.9 g/dL — AB (ref 6.5–8.1)

## 2018-04-15 LAB — CBC WITH DIFFERENTIAL (CANCER CENTER ONLY)
BASOS PCT: 0 %
Basophils Absolute: 0 10*3/uL (ref 0.0–0.1)
EOS ABS: 0.1 10*3/uL (ref 0.0–0.5)
Eosinophils Relative: 3 %
HCT: 34.3 % — ABNORMAL LOW (ref 34.8–46.6)
Hemoglobin: 11.4 g/dL — ABNORMAL LOW (ref 11.6–15.9)
Lymphocytes Relative: 22 %
Lymphs Abs: 0.6 10*3/uL — ABNORMAL LOW (ref 0.9–3.3)
MCH: 30.6 pg (ref 26.0–34.0)
MCHC: 33.2 g/dL (ref 32.0–36.0)
MCV: 92.2 fL (ref 81.0–101.0)
MONO ABS: 0.3 10*3/uL (ref 0.1–0.9)
MONOS PCT: 13 %
Neutro Abs: 1.7 10*3/uL (ref 1.5–6.5)
Neutrophils Relative %: 62 %
Platelet Count: 60 10*3/uL — ABNORMAL LOW (ref 145–400)
RBC: 3.72 MIL/uL (ref 3.70–5.32)
RDW: 15.5 % (ref 11.1–15.7)
WBC Count: 2.7 10*3/uL — ABNORMAL LOW (ref 3.9–10.0)

## 2018-04-15 LAB — AMMONIA: Ammonia: 112 umol/L — ABNORMAL HIGH (ref 9–35)

## 2018-04-15 NOTE — Progress Notes (Signed)
Hematology and Oncology Follow Up Visit  [name] [id number] Apr 19, 1941 77 y.o. 04/15/2018   Principle Diagnosis:  1. Recurrent iron-deficiency anemia. 2. 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 3. Remote history of stage II (T3, N0, M0) adenocarcinoma of colon. 4. 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     Interim History:  Ms.  Robertson is back for followup.  She actually looks quite good.  I think she has had a relatively decent spring and early summer so far..  He has been I do not think she has been hospitalized.  The last time she was hospitalized was probably in the summer 2018  She has had no bleeding.  She does have issues with her ammonia.  She has marked hyperammonianemia.  She is on lactulose to help with this.  Her last iron studies that we have back in November showed a ferritin of 20 with an iron saturation of 40%.    She has had no fever.  She had no cough.  Her main problem is the dementia.  This seems to be holding pretty steady.  Overall, her performance status is ECOG 2-3   Medications:  Current Outpatient Medications:  .  acidophilus (RISAQUAD) CAPS capsule, Take 1 capsule by mouth daily., Disp: , Rfl:  .  Apoaequorin (PREVAGEN EXTRA STRENGTH) 20 MG CAPS, Take 20 mg by mouth daily., Disp: , Rfl:  .  Ascorbic Acid (VITAMIN C) 1000 MG tablet, Take 1,000 mg by mouth daily., Disp: , Rfl:  .  cholecalciferol (VITAMIN D) 1000 units tablet, Take 1,000 Units by mouth daily., Disp: , Rfl:  .  Cinnamon 500 MG capsule, Take 500 mg by mouth daily. , Disp: , Rfl:  .  citalopram (CELEXA) 40 MG tablet, TAKE 1 TABLET BY MOUTH EVERY DAY, Disp: 90 tablet, Rfl: 0 .  fexofenadine (ALLEGRA) 180 MG tablet, Take 180 mg by mouth daily., Disp: , Rfl:  .  GENERLAC 10 GM/15ML SOLN, TAKE 30 MLS BY MOUTH THREE TIMES DAILY, Disp: 240 mL, Rfl: 0 .  Ginkgo Biloba 60  MG TABS, Take 60 mg by mouth daily. , Disp: , Rfl:  .  lactulose (CHRONULAC) 10 GM/15ML solution, TAKE 30 MLS BY MOUTH THREE TIMES DAILY, Disp: 10800 mL, Rfl: 0 .  losartan (COZAAR) 25 MG tablet, Take 25 mg by mouth daily., Disp: , Rfl:  .  losartan (COZAAR) 25 MG tablet, Take 1 tablet (25 mg total) by mouth daily., Disp: 90 tablet, Rfl: 2 .  Multiple Vitamin (MULTIVITAMIN WITH MINERALS) TABS tablet, Take 1 tablet by mouth daily., Disp: , Rfl:  .  omega-3 acid ethyl esters (LOVAZA) 1 g capsule, Take 1 g by mouth daily., Disp: , Rfl:  .  pantoprazole (PROTONIX) 40 MG tablet, TAKE 1 TABLET(40 MG) BY MOUTH DAILY, Disp: 90 tablet, Rfl: 3 .  spironolactone (ALDACTONE) 50 MG tablet, TAKE 1 TABLET(50 MG) BY MOUTH DAILY, Disp: 90 tablet, Rfl: 0 .  vitamin B-12 (CYANOCOBALAMIN) 1000 MCG tablet, Take 1,000 mcg by mouth daily., Disp: , Rfl:  .  vitamin E 400 UNIT capsule, Take 400 Units by mouth daily., Disp: , Rfl:   Allergies:  Allergies  Allergen Reactions  . Bee Venom Anaphylaxis  . Codeine Anaphylaxis and Rash  . Morphine And Related Anaphylaxis and Rash  . Phytonadione Other (See Comments)    Pt experienced an episode of cyanosis following dose of  Vitamin K 10 mg IV.  Marland Kitchen Promethazine Hcl Other (See Comments)    Reaction:  Unknown   . Penicillins Rash and Other (See Comments)    Has patient had a PCN reaction causing immediate rash, facial/tongue/throat swelling, SOB or lightheadedness with hypotension: No Has patient had a PCN reaction causing severe rash involving mucus membranes or skin necrosis: No Has patient had a PCN reaction that required hospitalization: No Has patient had a PCN reaction occurring within the last 10 years: No If all of the above answers are "NO", then may proceed with Cephalosporin use.    Past Medical History, Surgical history, Social history, and Family History were reviewed and updated.  Review of Systems: As stated in the interim history  Physical Exam:   weight is 225 lb (102.1 kg). Her oral temperature is 98.3 F (36.8 C). Her blood pressure is 106/64 and her pulse is 51 (abnormal). Her respiration is 16 and oxygen saturation is 100%.   I examined [name].  The findings on my examination are noted below:   Lungs are clear. Cardiac exam regular rate and rhythm. She has no murmurs rubs or bruits. Abdomen is soft. She has good bowel sounds. There is no fluid wave. There is a palpable liver edge. Her spleen tip is palpable with inspiration under the left costal margin. Extremities shows no clubbing, cyanosis or edema. Back exam shows no kyphosis. There is no tenderness over the spine, ribs or hips. Skin exam shows no rashes, ecchymoses or petechia. She has some seborrheic keratoses. Neurological exam shows no focal neurological deficits. She does have the dementia which is chronic.  Lab Results  Component Value Date   WBC 2.7 (L) 04/15/2018   HGB 11.4 (L) 04/15/2018   HCT 34.3 (L) 04/15/2018   MCV 92.2 04/15/2018   PLT 60 (L) 04/15/2018     Chemistry      Component Value Date/Time   NA 139 12/17/2017 1130   NA 141 08/20/2017 1315   K 3.6 12/17/2017 1130   K 4.2 08/20/2017 1315   CL 105 12/17/2017 1130   CL 108 08/20/2017 1315   CO2 30 12/17/2017 1130   CO2 27 08/20/2017 1315   BUN 16 12/17/2017 1130   BUN 14 08/20/2017 1315   CREATININE 0.50 (L) 12/17/2017 1130   CREATININE 0.84 09/08/2017 1038      Component Value Date/Time   CALCIUM 9.7 12/17/2017 1130   CALCIUM 10.0 08/20/2017 1315   ALKPHOS 67 12/17/2017 1130   ALKPHOS 73 08/20/2017 1315   AST 30 12/17/2017 1130   ALT 19 12/17/2017 1130   ALT 18 08/20/2017 1315   BILITOT 1.7 (H) 12/17/2017 1130         Impression and Plan: [name] is 77 year old female.  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  She has splenomegaly.  Her platelet count is a little bit better.  She is totally asymptomatic right now.  As such, I do not think we have to intervene with her  platelets.  Her hemoglobin is better.  She did get iron over the summer.  This has always helped.  Her dementia seems to be holding pretty steady right now.  We will plan to get her back to see [place] in another 4 months.  [name], MD 7/25/20191:32 PM

## 2018-04-16 LAB — IRON AND TIBC
Iron: 48 ug/dL (ref 41–142)
Saturation Ratios: 15 % — ABNORMAL LOW (ref 21–57)
TIBC: 320 ug/dL (ref 236–444)
UIBC: 272 ug/dL

## 2018-04-16 LAB — FERRITIN: FERRITIN: 13 ng/mL (ref 11–307)

## 2018-04-19 ENCOUNTER — Telehealth: Payer: Self-pay | Admitting: *Deleted

## 2018-04-19 NOTE — Telephone Encounter (Addendum)
Patient's husband aware of results. He knows to keep her on the lactulose. Appointment made for iron.   ----- Message from [name], MD sent at 04/15/2018  5:01 PM EDT ----- Call her husband   Ammonia is up a little more!!!  Keep her on the laxatives! Dierdre Searles, MD  P Onc Nurse [facility]        Call - iron is low. Needs 1 dose of feraheme. pete

## 2018-04-20 DIAGNOSIS — K7460 Unspecified cirrhosis of liver: Secondary | ICD-10-CM | POA: Diagnosis not present

## 2018-04-20 DIAGNOSIS — F09 Unspecified mental disorder due to known physiological condition: Secondary | ICD-10-CM | POA: Diagnosis not present

## 2018-04-22 ENCOUNTER — Inpatient Hospital Stay: Payer: Medicare Other | Attending: Hematology & Oncology

## 2018-04-22 ENCOUNTER — Other Ambulatory Visit: Payer: Self-pay | Admitting: Family

## 2018-04-22 VITALS — BP 111/43 | HR 51 | Temp 97.9°F | Resp 20

## 2018-04-22 DIAGNOSIS — Z79899 Other long term (current) drug therapy: Secondary | ICD-10-CM | POA: Insufficient documentation

## 2018-04-22 DIAGNOSIS — D500 Iron deficiency anemia secondary to blood loss (chronic): Secondary | ICD-10-CM

## 2018-04-22 DIAGNOSIS — D509 Iron deficiency anemia, unspecified: Secondary | ICD-10-CM | POA: Insufficient documentation

## 2018-04-22 MED ORDER — SODIUM CHLORIDE 0.9 % IV SOLN
510.0000 mg | Freq: Once | INTRAVENOUS | Status: AC
  Administered 2018-04-22: 510 mg via INTRAVENOUS
  Filled 2018-04-22: qty 17

## 2018-04-22 MED ORDER — SODIUM CHLORIDE 0.9 % IV SOLN
Freq: Once | INTRAVENOUS | Status: AC
  Administered 2018-04-22: 14:00:00 via INTRAVENOUS
  Filled 2018-04-22: qty 250

## 2018-04-22 NOTE — Patient Instructions (Signed)

## 2018-05-22 ENCOUNTER — Other Ambulatory Visit: Payer: Self-pay | Admitting: Family Medicine

## 2018-05-22 ENCOUNTER — Other Ambulatory Visit: Payer: Self-pay | Admitting: Hematology & Oncology

## 2018-05-22 DIAGNOSIS — K7460 Unspecified cirrhosis of liver: Secondary | ICD-10-CM

## 2018-05-22 DIAGNOSIS — D500 Iron deficiency anemia secondary to blood loss (chronic): Secondary | ICD-10-CM

## 2018-05-22 DIAGNOSIS — D696 Thrombocytopenia, unspecified: Secondary | ICD-10-CM

## 2018-08-12 ENCOUNTER — Encounter: Payer: Self-pay | Admitting: Hematology & Oncology

## 2018-08-12 ENCOUNTER — Other Ambulatory Visit: Payer: Self-pay

## 2018-08-12 ENCOUNTER — Inpatient Hospital Stay (HOSPITAL_BASED_OUTPATIENT_CLINIC_OR_DEPARTMENT_OTHER): Payer: Medicare Other | Admitting: Hematology & Oncology

## 2018-08-12 ENCOUNTER — Telehealth: Payer: Self-pay | Admitting: *Deleted

## 2018-08-12 ENCOUNTER — Inpatient Hospital Stay: Payer: Medicare Other | Attending: Hematology & Oncology

## 2018-08-12 VITALS — BP 115/87 | HR 54 | Temp 98.8°F | Resp 18 | Wt 220.0 lb

## 2018-08-12 DIAGNOSIS — Z79899 Other long term (current) drug therapy: Secondary | ICD-10-CM | POA: Diagnosis not present

## 2018-08-12 DIAGNOSIS — C679 Malignant neoplasm of bladder, unspecified: Secondary | ICD-10-CM

## 2018-08-12 DIAGNOSIS — C182 Malignant neoplasm of ascending colon: Secondary | ICD-10-CM

## 2018-08-12 DIAGNOSIS — F0391 Unspecified dementia with behavioral disturbance: Secondary | ICD-10-CM

## 2018-08-12 DIAGNOSIS — D72819 Decreased white blood cell count, unspecified: Secondary | ICD-10-CM | POA: Insufficient documentation

## 2018-08-12 DIAGNOSIS — D500 Iron deficiency anemia secondary to blood loss (chronic): Secondary | ICD-10-CM

## 2018-08-12 DIAGNOSIS — K7460 Unspecified cirrhosis of liver: Secondary | ICD-10-CM | POA: Insufficient documentation

## 2018-08-12 DIAGNOSIS — D6959 Other secondary thrombocytopenia: Secondary | ICD-10-CM | POA: Insufficient documentation

## 2018-08-12 DIAGNOSIS — D509 Iron deficiency anemia, unspecified: Secondary | ICD-10-CM | POA: Insufficient documentation

## 2018-08-12 DIAGNOSIS — Z23 Encounter for immunization: Secondary | ICD-10-CM | POA: Insufficient documentation

## 2018-08-12 DIAGNOSIS — K7581 Nonalcoholic steatohepatitis (NASH): Secondary | ICD-10-CM

## 2018-08-12 DIAGNOSIS — D696 Thrombocytopenia, unspecified: Secondary | ICD-10-CM

## 2018-08-12 LAB — CMP (CANCER CENTER ONLY)
ALT: 21 U/L (ref 10–47)
AST: 30 U/L (ref 11–38)
Albumin: 3 g/dL — ABNORMAL LOW (ref 3.5–5.0)
Alkaline Phosphatase: 66 U/L (ref 26–84)
Anion gap: 0 — ABNORMAL LOW (ref 5–15)
BILIRUBIN TOTAL: 2.4 mg/dL — AB (ref 0.2–1.6)
BUN: 20 mg/dL (ref 7–22)
CHLORIDE: 112 mmol/L — AB (ref 98–108)
CO2: 29 mmol/L (ref 18–33)
CREATININE: 1 mg/dL (ref 0.60–1.20)
Calcium: 9.9 mg/dL (ref 8.0–10.3)
Glucose, Bld: 142 mg/dL — ABNORMAL HIGH (ref 73–118)
Potassium: 3.9 mmol/L (ref 3.3–4.7)
SODIUM: 140 mmol/L (ref 128–145)
Total Protein: 5.6 g/dL — ABNORMAL LOW (ref 6.4–8.1)

## 2018-08-12 LAB — CBC WITH DIFFERENTIAL (CANCER CENTER ONLY)
Abs Immature Granulocytes: 0.02 K/uL (ref 0.00–0.07)
Basophils Absolute: 0 K/uL (ref 0.0–0.1)
Basophils Relative: 0 %
Eosinophils Absolute: 0.1 K/uL (ref 0.0–0.5)
Eosinophils Relative: 2 %
HCT: 37 % (ref 36.0–46.0)
Hemoglobin: 11.9 g/dL — ABNORMAL LOW (ref 12.0–15.0)
Immature Granulocytes: 1 %
Lymphocytes Relative: 17 %
Lymphs Abs: 0.6 K/uL — ABNORMAL LOW (ref 0.7–4.0)
MCH: 32.7 pg (ref 26.0–34.0)
MCHC: 32.2 g/dL (ref 30.0–36.0)
MCV: 101.6 fL — ABNORMAL HIGH (ref 80.0–100.0)
Monocytes Absolute: 0.3 K/uL (ref 0.1–1.0)
Monocytes Relative: 10 %
Neutro Abs: 2.3 K/uL (ref 1.7–7.7)
Neutrophils Relative %: 70 %
Platelet Count: 45 K/uL — ABNORMAL LOW (ref 150–400)
RBC: 3.64 MIL/uL — ABNORMAL LOW (ref 3.87–5.11)
RDW: 15 % (ref 11.5–15.5)
WBC Count: 3.3 K/uL — ABNORMAL LOW (ref 4.0–10.5)
nRBC: 0 % (ref 0.0–0.2)

## 2018-08-12 LAB — AMMONIA: Ammonia: 95 umol/L — ABNORMAL HIGH (ref 9–35)

## 2018-08-12 MED ORDER — INFLUENZA VAC SPLIT QUAD 0.5 ML IM SUSY
0.5000 mL | PREFILLED_SYRINGE | Freq: Once | INTRAMUSCULAR | Status: AC
  Administered 2018-08-12: 0.5 mL via INTRAMUSCULAR

## 2018-08-12 MED ORDER — INFLUENZA VAC SPLIT QUAD 0.5 ML IM SUSY
PREFILLED_SYRINGE | INTRAMUSCULAR | Status: AC
  Filled 2018-08-12: qty 0.5

## 2018-08-12 NOTE — Telephone Encounter (Signed)
-----   Message from [name], MD sent at 08/12/2018  4:32 PM EST ----- Call her husband -- the ammonia is a little better at 95.  Keep taking the Lactulose.  pete

## 2018-08-12 NOTE — Progress Notes (Signed)
Hematology and Oncology Follow Up Visit  [name] [id number] [birth year] 78 y.o. 08/12/2018   Principle Diagnosis:  1. Recurrent iron-deficiency anemia. 2. 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 3. Remote history of stage II (T3, N0, M0) adenocarcinoma of colon. 4. 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     Interim History:  Ms.  Robertson is back for followup.  She actually looks quite good.  I think she has had a relatively decent spring and early summer so far..  He has been I do not think she has been hospitalized.  The last time she was hospitalized was probably in the summer 2018  She has had no bleeding.  She does have issues with her ammonia.  She has marked hyperammonianemia.  She is on lactulose to help with this.  Her last iron studies that we have back in November showed a ferritin of 20 with an iron saturation of 40%.    She has had no fever.  She had no cough.  Her main problem is the dementia.  This seems to be holding pretty steady.  Overall, her performance status is ECOG 2-3   Medications:  Current Outpatient Medications:  .  acidophilus (RISAQUAD) CAPS capsule, Take 1 capsule by mouth daily., Disp: , Rfl:  .  Apoaequorin (PREVAGEN EXTRA STRENGTH) 20 MG CAPS, Take 20 mg by mouth daily., Disp: , Rfl:  .  Ascorbic Acid (VITAMIN C) 1000 MG tablet, Take 1,000 mg by mouth daily., Disp: , Rfl:  .  cholecalciferol (VITAMIN D) 1000 units tablet, Take 1,000 Units by mouth daily., Disp: , Rfl:  .  Cinnamon 500 MG capsule, Take 500 mg by mouth daily. , Disp: , Rfl:  .  citalopram (CELEXA) 40 MG tablet, TAKE 1 TABLET BY MOUTH EVERY DAY, Disp: 90 tablet, Rfl: 3 .  fexofenadine (ALLEGRA) 180 MG tablet, Take 180 mg by mouth daily., Disp: , Rfl:  .  GENERLAC 10 GM/15ML SOLN, TAKE 30 MLS BY MOUTH THREE TIMES DAILY, Disp: 240 mL, Rfl: 0 .  Ginkgo Biloba 60  MG TABS, Take 60 mg by mouth daily. , Disp: , Rfl:  .  lactulose (CHRONULAC) 10 GM/15ML solution, TAKE 30 MLS BY MOUTH THREE TIMES DAILY, Disp: 10800 mL, Rfl: 0 .  losartan (COZAAR) 25 MG tablet, Take 25 mg by mouth daily., Disp: , Rfl:  .  losartan (COZAAR) 25 MG tablet, Take 1 tablet (25 mg total) by mouth daily., Disp: 90 tablet, Rfl: 2 .  Multiple Vitamin (MULTIVITAMIN WITH MINERALS) TABS tablet, Take 1 tablet by mouth daily., Disp: , Rfl:  .  omega-3 acid ethyl esters (LOVAZA) 1 g capsule, Take 1 g by mouth daily., Disp: , Rfl:  .  pantoprazole (PROTONIX) 40 MG tablet, TAKE 1 TABLET(40 MG) BY MOUTH DAILY, Disp: 90 tablet, Rfl: 3 .  spironolactone (ALDACTONE) 50 MG tablet, TAKE 1 TABLET(50 MG) BY MOUTH DAILY, Disp: 90 tablet, Rfl: 0 .  vitamin B-12 (CYANOCOBALAMIN) 1000 MCG tablet, Take 1,000 mcg by mouth daily., Disp: , Rfl:  .  vitamin E 400 UNIT capsule, Take 400 Units by mouth daily., Disp: , Rfl:   Allergies:  Allergies  Allergen Reactions  . Bee Venom Anaphylaxis  . Codeine Anaphylaxis and Rash  . Morphine And Related Anaphylaxis and Rash  . Phytonadione Other (See Comments)    Pt experienced an episode of cyanosis following dose of  Vitamin K 10 mg IV.  Marland Kitchen Promethazine Hcl Other (See Comments)    Reaction:  Unknown   . Penicillins Rash and Other (See Comments)    Has patient had a PCN reaction causing immediate rash, facial/tongue/throat swelling, SOB or lightheadedness with hypotension: No Has patient had a PCN reaction causing severe rash involving mucus membranes or skin necrosis: No Has patient had a PCN reaction that required hospitalization: No Has patient had a PCN reaction occurring within the last 10 years: No If all of the above answers are "NO", then may proceed with Cephalosporin use.    Past Medical History, Surgical history, Social history, and Family History were reviewed and updated.  Review of Systems: Review of Systems  Constitutional: Negative.   HENT:  Negative.   Eyes: Negative.   Respiratory: Negative.   Cardiovascular: Negative.   Gastrointestinal: Negative.   Genitourinary: Negative.   Musculoskeletal: Negative.   Skin: Negative.   Neurological: Negative.   Endo/Heme/Allergies: Negative.   Psychiatric/Behavioral: Positive for memory loss.     Physical Exam:  weight is 220 lb (99.8 kg). Her oral temperature is 98.8 F (37.1 C). Her blood pressure is 115/87 and her pulse is 54 (abnormal). Her respiration is 18 and oxygen saturation is 97%.   Physical Exam  Constitutional: She is oriented to person, place, and time.  HENT:  Head: Normocephalic and atraumatic.  Mouth/Throat: Oropharynx is clear and moist.  Eyes: Pupils are equal, round, and reactive to light. EOM are normal.  Neck: Normal range of motion.  Cardiovascular: Normal rate, regular rhythm and normal heart sounds.  Pulmonary/Chest: Effort normal and breath sounds normal.  Abdominal: Soft. Bowel sounds are normal.  Musculoskeletal: Normal range of motion. She exhibits no edema, tenderness or deformity.  Lymphadenopathy:    She has no cervical adenopathy.  Neurological: She is alert and oriented to person, place, and time.  Skin: Skin is warm and dry. No rash noted. No erythema.  Psychiatric: She has a normal mood and affect. Her behavior is normal. Judgment and thought content normal.  Vitals reviewed.    Lab Results  Component Value Date   WBC 3.3 (L) 08/12/2018   HGB 11.9 (L) 08/12/2018   HCT 37.0 08/12/2018   MCV 101.6 (H) 08/12/2018   PLT 45 (L) 08/12/2018     Chemistry      Component Value Date/Time   NA 140 04/15/2018 1150   NA 141 08/20/2017 1315   K 4.2 04/15/2018 1150   K 4.2 08/20/2017 1315   CL 107 04/15/2018 1150   CL 108 08/20/2017 1315   CO2 25 04/15/2018 1150   CO2 27 08/20/2017 1315   BUN 15 04/15/2018 1150   BUN 14 08/20/2017 1315   CREATININE 0.84 04/15/2018 1150   CREATININE 0.84 09/08/2017 1038      Component Value Date/Time    CALCIUM 10.0 04/15/2018 1150   CALCIUM 10.0 08/20/2017 1315   ALKPHOS 72 04/15/2018 1150   ALKPHOS 73 08/20/2017 1315   AST 23 04/15/2018 1150   ALT 13 04/15/2018 1150   ALT 18 08/20/2017 1315   BILITOT 1.4 (H) 04/15/2018 1150         Impression and Plan: Ms. Distel is 77 year old female.  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  She has splenomegaly.  Her platelet count is a little bit better.  She is totally asymptomatic right now.  As such, I do not think we have to intervene with her platelets.  Her hemoglobin is better.  She did get iron over the summer.  This has always helped.  Her dementia seems to be holding pretty steady right now.  We will plan to get her back to see [place] in another 4 months.  [name], MD 11/21/20192:14 PM

## 2018-08-12 NOTE — Telephone Encounter (Signed)
As noted below by Dr. [name], I informed the husband that the ammonia level is better but she needs to keep taking the Lactulose. He verbalized understanding.

## 2018-08-13 ENCOUNTER — Encounter: Payer: Self-pay | Admitting: *Deleted

## 2018-08-13 LAB — IRON AND TIBC
IRON: 155 ug/dL — AB (ref 41–142)
Saturation Ratios: 60 % — ABNORMAL HIGH (ref 21–57)
TIBC: 258 ug/dL (ref 236–444)
UIBC: 103 ug/dL — ABNORMAL LOW (ref 120–384)

## 2018-08-13 LAB — FERRITIN: Ferritin: 36 ng/mL (ref 11–307)

## 2018-08-20 ENCOUNTER — Other Ambulatory Visit: Payer: Self-pay | Admitting: Hematology & Oncology

## 2018-08-20 DIAGNOSIS — D500 Iron deficiency anemia secondary to blood loss (chronic): Secondary | ICD-10-CM

## 2018-08-20 DIAGNOSIS — D696 Thrombocytopenia, unspecified: Secondary | ICD-10-CM

## 2018-08-20 DIAGNOSIS — K7460 Unspecified cirrhosis of liver: Secondary | ICD-10-CM

## 2018-08-31 ENCOUNTER — Other Ambulatory Visit: Payer: Self-pay | Admitting: Gastroenterology

## 2018-08-31 DIAGNOSIS — K7469 Other cirrhosis of liver: Secondary | ICD-10-CM

## 2018-09-28 ENCOUNTER — Ambulatory Visit
Admission: RE | Admit: 2018-09-28 | Discharge: 2018-09-28 | Disposition: A | Discharge status: Home / Self Care | Payer: Medicare Other | Source: Ambulatory Visit | Attending: Gastroenterology | Admitting: Gastroenterology

## 2018-09-28 DIAGNOSIS — K7469 Other cirrhosis of liver: Secondary | ICD-10-CM

## 2018-09-28 DIAGNOSIS — R161 Splenomegaly, not elsewhere classified: Secondary | ICD-10-CM | POA: Diagnosis not present

## 2018-10-12 DIAGNOSIS — F09 Unspecified mental disorder due to known physiological condition: Secondary | ICD-10-CM | POA: Diagnosis not present

## 2018-10-12 DIAGNOSIS — K7460 Unspecified cirrhosis of liver: Secondary | ICD-10-CM | POA: Diagnosis not present

## 2018-11-10 ENCOUNTER — Other Ambulatory Visit: Payer: Self-pay | Admitting: Hematology & Oncology

## 2018-11-10 DIAGNOSIS — R7989 Other specified abnormal findings of blood chemistry: Secondary | ICD-10-CM

## 2018-11-10 DIAGNOSIS — G9340 Encephalopathy, unspecified: Secondary | ICD-10-CM

## 2018-11-18 ENCOUNTER — Other Ambulatory Visit: Payer: Self-pay | Admitting: Hematology & Oncology

## 2018-11-18 ENCOUNTER — Other Ambulatory Visit: Payer: Self-pay | Admitting: Family Medicine

## 2018-11-18 DIAGNOSIS — D500 Iron deficiency anemia secondary to blood loss (chronic): Secondary | ICD-10-CM

## 2018-11-18 DIAGNOSIS — D696 Thrombocytopenia, unspecified: Secondary | ICD-10-CM

## 2018-11-18 DIAGNOSIS — I10 Essential (primary) hypertension: Secondary | ICD-10-CM

## 2018-11-18 DIAGNOSIS — K7460 Unspecified cirrhosis of liver: Secondary | ICD-10-CM

## 2018-12-12 ENCOUNTER — Other Ambulatory Visit: Payer: Self-pay | Admitting: Family Medicine

## 2018-12-16 ENCOUNTER — Inpatient Hospital Stay: Payer: Medicare Other | Attending: Hematology & Oncology | Admitting: Hematology & Oncology

## 2018-12-16 ENCOUNTER — Ambulatory Visit (HOSPITAL_BASED_OUTPATIENT_CLINIC_OR_DEPARTMENT_OTHER)
Admission: RE | Admit: 2018-12-16 | Discharge: 2018-12-16 | Disposition: A | Discharge status: Home / Self Care | Payer: Medicare Other | Source: Ambulatory Visit | Attending: Hematology & Oncology | Admitting: Hematology & Oncology

## 2018-12-16 ENCOUNTER — Encounter: Payer: Self-pay | Admitting: Hematology & Oncology

## 2018-12-16 ENCOUNTER — Other Ambulatory Visit: Payer: Self-pay

## 2018-12-16 ENCOUNTER — Inpatient Hospital Stay: Payer: Medicare Other

## 2018-12-16 DIAGNOSIS — R079 Chest pain, unspecified: Secondary | ICD-10-CM | POA: Diagnosis not present

## 2018-12-16 DIAGNOSIS — C182 Malignant neoplasm of ascending colon: Secondary | ICD-10-CM | POA: Insufficient documentation

## 2018-12-16 DIAGNOSIS — D6959 Other secondary thrombocytopenia: Secondary | ICD-10-CM | POA: Diagnosis not present

## 2018-12-16 DIAGNOSIS — F0390 Unspecified dementia without behavioral disturbance: Secondary | ICD-10-CM | POA: Diagnosis not present

## 2018-12-16 DIAGNOSIS — D509 Iron deficiency anemia, unspecified: Secondary | ICD-10-CM | POA: Diagnosis not present

## 2018-12-16 DIAGNOSIS — K7581 Nonalcoholic steatohepatitis (NASH): Secondary | ICD-10-CM | POA: Diagnosis not present

## 2018-12-16 DIAGNOSIS — K7460 Unspecified cirrhosis of liver: Secondary | ICD-10-CM | POA: Insufficient documentation

## 2018-12-16 DIAGNOSIS — Z79899 Other long term (current) drug therapy: Secondary | ICD-10-CM | POA: Diagnosis not present

## 2018-12-16 DIAGNOSIS — D72819 Decreased white blood cell count, unspecified: Secondary | ICD-10-CM | POA: Diagnosis not present

## 2018-12-16 DIAGNOSIS — Z85038 Personal history of other malignant neoplasm of large intestine: Secondary | ICD-10-CM | POA: Diagnosis not present

## 2018-12-16 DIAGNOSIS — Z85528 Personal history of other malignant neoplasm of kidney: Secondary | ICD-10-CM

## 2018-12-16 DIAGNOSIS — D500 Iron deficiency anemia secondary to blood loss (chronic): Secondary | ICD-10-CM

## 2018-12-16 LAB — CBC WITH DIFFERENTIAL (CANCER CENTER ONLY)
Abs Immature Granulocytes: 0.01 10*3/uL (ref 0.00–0.07)
Basophils Absolute: 0 10*3/uL (ref 0.0–0.1)
Basophils Relative: 1 %
Eosinophils Absolute: 0.1 10*3/uL (ref 0.0–0.5)
Eosinophils Relative: 2 %
HCT: 35 % — ABNORMAL LOW (ref 36.0–46.0)
Hemoglobin: 11.2 g/dL — ABNORMAL LOW (ref 12.0–15.0)
Immature Granulocytes: 0 %
Lymphocytes Relative: 23 %
Lymphs Abs: 0.7 10*3/uL (ref 0.7–4.0)
MCH: 31.4 pg (ref 26.0–34.0)
MCHC: 32 g/dL (ref 30.0–36.0)
MCV: 98 fL (ref 80.0–100.0)
Monocytes Absolute: 0.3 10*3/uL (ref 0.1–1.0)
Monocytes Relative: 9 %
NEUTROS PCT: 65 %
NRBC: 0 % (ref 0.0–0.2)
Neutro Abs: 1.9 10*3/uL (ref 1.7–7.7)
Platelet Count: 72 10*3/uL — ABNORMAL LOW (ref 150–400)
RBC: 3.57 MIL/uL — ABNORMAL LOW (ref 3.87–5.11)
RDW: 15.4 % (ref 11.5–15.5)
WBC Count: 3 10*3/uL — ABNORMAL LOW (ref 4.0–10.5)

## 2018-12-16 LAB — CMP (CANCER CENTER ONLY)
ALT: 13 U/L (ref 0–44)
AST: 22 U/L (ref 15–41)
Albumin: 3.2 g/dL — ABNORMAL LOW (ref 3.5–5.0)
Alkaline Phosphatase: 73 U/L (ref 38–126)
Anion gap: 6 (ref 5–15)
BUN: 17 mg/dL (ref 8–23)
CALCIUM: 9.7 mg/dL (ref 8.9–10.3)
CO2: 29 mmol/L (ref 22–32)
Chloride: 107 mmol/L (ref 98–111)
Creatinine: 0.78 mg/dL (ref 0.44–1.00)
GFR, Est AFR Am: >60 mL/min (ref >60)
GFR, Estimated: >60 mL/min (ref >60)
Glucose, Bld: 148 mg/dL — ABNORMAL HIGH (ref 70–99)
Potassium: 4.3 mmol/L (ref 3.5–5.1)
Sodium: 142 mmol/L (ref 135–145)
Total Bilirubin: 1.5 mg/dL — ABNORMAL HIGH (ref 0.3–1.2)
Total Protein: 5.4 g/dL — ABNORMAL LOW (ref 6.5–8.1)

## 2018-12-16 LAB — AMMONIA: AMMONIA: 102 umol/L — AB (ref 9–35)

## 2018-12-16 NOTE — Addendum Note (Signed)
Addended by: Burney Gauze R on: 12/16/2018 02:08 PM   Modules accepted: Orders

## 2018-12-16 NOTE — Progress Notes (Signed)
Hematology and Oncology Follow Up Visit  [name] [id number] [birth year] 78 y.o. 12/16/2018   Principle Diagnosis:  1. Recurrent iron-deficiency anemia. 2. 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 3. Remote history of stage II (T3, N0, M0) adenocarcinoma of colon. 4. 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     Interim History:  Ms.  [name] is back for followup.  Given all of her issues with the dementia, she actually is doing pretty well.  She has bad cirrhosis.  When we last saw her in November, her ammonia was 95.  It is always been this high.  She really is about the same.  Her husband, who has to come with her, says she really has had no problems.  She did seem to enjoy all the holidays.  She is not really aware of the coronavirus.  I guess this is a blessing that she does not really know what is going on.  She is had no bleeding.  There is been no obvious change in bowel or bladder habits.  Her last iron studies that we did back in November showed a ferritin of 36 with an iron saturation of 60%.  Overall, her performance status is ECOG 2-3   Medications:  Current Outpatient Medications:  .  acidophilus (RISAQUAD) CAPS capsule, Take 1 capsule by mouth daily., Disp: , Rfl:  .  Apoaequorin (PREVAGEN EXTRA STRENGTH) 20 MG CAPS, Take 20 mg by mouth daily., Disp: , Rfl:  .  Ascorbic Acid (VITAMIN C) 1000 MG tablet, Take 1,000 mg by mouth daily., Disp: , Rfl:  .  cholecalciferol (VITAMIN D) 1000 units tablet, Take 1,000 Units by mouth daily., Disp: , Rfl:  .  Cinnamon 500 MG capsule, Take 500 mg by mouth daily. , Disp: , Rfl:  .  citalopram (CELEXA) 40 MG tablet, TAKE 1 TABLET BY MOUTH EVERY DAY, Disp: 90 tablet, Rfl: 3 .  fexofenadine (ALLEGRA) 180 MG tablet, Take 180 mg by mouth daily., Disp: , Rfl:  .  GENERLAC 10 GM/15ML SOLN, TAKE 30 MLS BY MOUTH THREE  TIMES DAILY, Disp: 240 mL, Rfl: 0 .  Ginkgo Biloba 60 MG TABS, Take 60 mg by mouth daily. , Disp: , Rfl:  .  lactulose (CHRONULAC) 10 GM/15ML solution, TAKE 30 ML BY MOUTH THREE TIMES DAILY, Disp: 240 mL, Rfl: 0 .  losartan (COZAAR) 25 MG tablet, Take 25 mg by mouth daily., Disp: , Rfl:  .  losartan (COZAAR) 25 MG tablet, TAKE 1 TABLET(25 MG) BY MOUTH DAILY, Disp: 90 tablet, Rfl: 2 .  Multiple Vitamin (MULTIVITAMIN WITH MINERALS) TABS tablet, Take 1 tablet by mouth daily., Disp: , Rfl:  .  omega-3 acid ethyl esters (LOVAZA) 1 g capsule, Take 1 g by mouth daily., Disp: , Rfl:  .  pantoprazole (PROTONIX) 40 MG tablet, TAKE 1 TABLET(40 MG) BY MOUTH DAILY, Disp: 90 tablet, Rfl: 3 .  spironolactone (ALDACTONE) 50 MG tablet, TAKE 1 TABLET(50 MG) BY MOUTH DAILY, Disp: 90 tablet, Rfl: 0 .  vitamin B-12 (CYANOCOBALAMIN) 1000 MCG tablet, Take 1,000 mcg by mouth daily., Disp: , Rfl:  .  vitamin E 400 UNIT capsule, Take 400 Units by mouth daily., Disp: , Rfl:   Allergies:  Allergies  Allergen Reactions  . Bee Venom Anaphylaxis  . Codeine Anaphylaxis and Rash  . Morphine And Related Anaphylaxis and Rash  . Phytonadione Other (See Comments)  Pt experienced an episode of cyanosis following dose of Vitamin K 10 mg IV.  Marland Kitchen Promethazine Hcl Other (See Comments)    Reaction:  Unknown   . Penicillins Rash and Other (See Comments)    Has patient had a PCN reaction causing immediate rash, facial/tongue/throat swelling, SOB or lightheadedness with hypotension: No Has patient had a PCN reaction causing severe rash involving mucus membranes or skin necrosis: No Has patient had a PCN reaction that required hospitalization: No Has patient had a PCN reaction occurring within the last 10 years: No If all of the above answers are "NO", then may proceed with Cephalosporin use.    Past Medical History, Surgical history, Social history, and Family History were reviewed and updated.  Review of Systems: Review of  Systems  Constitutional: Negative.   HENT: Negative.   Eyes: Negative.   Respiratory: Negative.   Cardiovascular: Negative.   Gastrointestinal: Negative.   Genitourinary: Negative.   Musculoskeletal: Negative.   Skin: Negative.   Neurological: Negative.   Endo/Heme/Allergies: Negative.   Psychiatric/Behavioral: Positive for memory loss.     Physical Exam:  vitals were not taken for this visit.   Physical Exam Vitals signs reviewed.  HENT:     Head: Normocephalic and atraumatic.  Eyes:     Pupils: Pupils are equal, round, and reactive to light.  Neck:     Musculoskeletal: Normal range of motion.  Cardiovascular:     Rate and Rhythm: Normal rate and regular rhythm.     Heart sounds: Normal heart sounds.  Pulmonary:     Effort: Pulmonary effort is normal.     Breath sounds: Normal breath sounds.  Abdominal:     General: Bowel sounds are normal.     Palpations: Abdomen is soft.  Musculoskeletal: Normal range of motion.        General: No tenderness or deformity.  Lymphadenopathy:     Cervical: No cervical adenopathy.  Skin:    General: Skin is warm and dry.     Findings: No erythema or rash.  Neurological:     Mental Status: She is alert and oriented to person, place, and time.  Psychiatric:        Behavior: Behavior normal.        Thought Content: Thought content normal.        Judgment: Judgment normal.      Lab Results  Component Value Date   WBC 3.0 (L) 12/16/2018   HGB 11.2 (L) 12/16/2018   HCT 35.0 (L) 12/16/2018   MCV 98.0 12/16/2018   PLT 72 (L) 12/16/2018     Chemistry      Component Value Date/Time   NA 142 12/16/2018 1303   NA 141 08/20/2017 1315   K 4.3 12/16/2018 1303   K 4.2 08/20/2017 1315   CL 107 12/16/2018 1303   CL 108 08/20/2017 1315   CO2 29 12/16/2018 1303   CO2 27 08/20/2017 1315   BUN 17 12/16/2018 1303   BUN 14 08/20/2017 1315   CREATININE 0.78 12/16/2018 1303   CREATININE 0.84 09/08/2017 1038      Component Value  Date/Time   CALCIUM 9.7 12/16/2018 1303   CALCIUM 10.0 08/20/2017 1315   ALKPHOS 73 12/16/2018 1303   ALKPHOS 73 08/20/2017 1315   AST 22 12/16/2018 1303   ALT 13 12/16/2018 1303   ALT 18 08/20/2017 1315   BILITOT 1.5 (H) 12/16/2018 1303         Impression and Plan: Ms. Gatt is  78 year old female.  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  She has splenomegaly.  Her platelet count is actually much better.  She is totally asymptomatic right now.  As such, I do not think we have to intervene with her platelets.  Her dementia seems to be holding pretty steady right now.  We will plan to get her back to see [place] in another 4 months.  [name], MD 3/26/20201:56 PM

## 2018-12-17 ENCOUNTER — Telehealth: Payer: Self-pay | Admitting: *Deleted

## 2018-12-17 LAB — IRON AND TIBC
Iron: 65 ug/dL (ref 41–142)
Saturation Ratios: 23 % (ref 21–57)
TIBC: 286 ug/dL (ref 236–444)
UIBC: 221 ug/dL (ref 120–384)

## 2018-12-17 LAB — FERRITIN: Ferritin: 12 ng/mL (ref 11–307)

## 2018-12-17 NOTE — Telephone Encounter (Addendum)
Patient's husband is aware of both results.   ----- Message from [name], MD sent at 12/17/2018  6:30 AM EDT ----- acall - the ammonia level is 100.  This is stable!!  [name]  the CXR shows a little fluid around the LEFT lung. There is not enough to cause any problems.

## 2018-12-20 ENCOUNTER — Other Ambulatory Visit: Payer: Self-pay | Admitting: Hematology & Oncology

## 2018-12-20 DIAGNOSIS — R7989 Other specified abnormal findings of blood chemistry: Secondary | ICD-10-CM

## 2018-12-20 DIAGNOSIS — G9340 Encephalopathy, unspecified: Secondary | ICD-10-CM

## 2018-12-22 ENCOUNTER — Other Ambulatory Visit: Payer: Self-pay | Admitting: Hematology & Oncology

## 2018-12-22 DIAGNOSIS — G9340 Encephalopathy, unspecified: Secondary | ICD-10-CM

## 2018-12-22 DIAGNOSIS — R7989 Other specified abnormal findings of blood chemistry: Secondary | ICD-10-CM

## 2019-02-16 ENCOUNTER — Other Ambulatory Visit: Payer: Self-pay | Admitting: Hematology & Oncology

## 2019-02-16 DIAGNOSIS — D696 Thrombocytopenia, unspecified: Secondary | ICD-10-CM

## 2019-02-16 DIAGNOSIS — D500 Iron deficiency anemia secondary to blood loss (chronic): Secondary | ICD-10-CM

## 2019-02-16 DIAGNOSIS — K7460 Unspecified cirrhosis of liver: Secondary | ICD-10-CM

## 2019-03-11 ENCOUNTER — Other Ambulatory Visit: Payer: Self-pay | Admitting: Family Medicine

## 2019-04-14 ENCOUNTER — Inpatient Hospital Stay: Payer: Medicare Other

## 2019-04-14 ENCOUNTER — Inpatient Hospital Stay: Payer: Medicare Other | Attending: Hematology & Oncology | Admitting: Family

## 2019-04-14 ENCOUNTER — Encounter: Payer: Self-pay | Admitting: Family

## 2019-04-14 ENCOUNTER — Other Ambulatory Visit: Payer: Self-pay

## 2019-04-14 VITALS — BP 108/46 | HR 65 | Temp 98.7°F | Resp 20 | Ht 65.0 in | Wt 215.0 lb

## 2019-04-14 DIAGNOSIS — Z85038 Personal history of other malignant neoplasm of large intestine: Secondary | ICD-10-CM | POA: Insufficient documentation

## 2019-04-14 DIAGNOSIS — Z85528 Personal history of other malignant neoplasm of kidney: Secondary | ICD-10-CM

## 2019-04-14 DIAGNOSIS — D509 Iron deficiency anemia, unspecified: Secondary | ICD-10-CM | POA: Insufficient documentation

## 2019-04-14 DIAGNOSIS — D72819 Decreased white blood cell count, unspecified: Secondary | ICD-10-CM | POA: Insufficient documentation

## 2019-04-14 DIAGNOSIS — Z79899 Other long term (current) drug therapy: Secondary | ICD-10-CM | POA: Diagnosis not present

## 2019-04-14 DIAGNOSIS — D696 Thrombocytopenia, unspecified: Secondary | ICD-10-CM

## 2019-04-14 DIAGNOSIS — D500 Iron deficiency anemia secondary to blood loss (chronic): Secondary | ICD-10-CM

## 2019-04-14 DIAGNOSIS — D6959 Other secondary thrombocytopenia: Secondary | ICD-10-CM

## 2019-04-14 DIAGNOSIS — C182 Malignant neoplasm of ascending colon: Secondary | ICD-10-CM

## 2019-04-14 DIAGNOSIS — K7581 Nonalcoholic steatohepatitis (NASH): Secondary | ICD-10-CM | POA: Insufficient documentation

## 2019-04-14 DIAGNOSIS — C679 Malignant neoplasm of bladder, unspecified: Secondary | ICD-10-CM

## 2019-04-14 LAB — CBC WITH DIFFERENTIAL (CANCER CENTER ONLY)
Abs Immature Granulocytes: 0.01 10*3/uL (ref 0.00–0.07)
Basophils Absolute: 0 10*3/uL (ref 0.0–0.1)
Basophils Relative: 1 %
Eosinophils Absolute: 0.1 10*3/uL (ref 0.0–0.5)
Eosinophils Relative: 2 %
HCT: 35.2 % — ABNORMAL LOW (ref 36.0–46.0)
Hemoglobin: 11.4 g/dL — ABNORMAL LOW (ref 12.0–15.0)
Immature Granulocytes: 0 %
Lymphocytes Relative: 21 %
Lymphs Abs: 0.7 10*3/uL (ref 0.7–4.0)
MCH: 30.8 pg (ref 26.0–34.0)
MCHC: 32.4 g/dL (ref 30.0–36.0)
MCV: 95.1 fL (ref 80.0–100.0)
Monocytes Absolute: 0.4 10*3/uL (ref 0.1–1.0)
Monocytes Relative: 12 %
Neutro Abs: 2 10*3/uL (ref 1.7–7.7)
Neutrophils Relative %: 64 %
Platelet Count: 64 10*3/uL — ABNORMAL LOW (ref 150–400)
RBC: 3.7 MIL/uL — ABNORMAL LOW (ref 3.87–5.11)
RDW: 16 % — ABNORMAL HIGH (ref 11.5–15.5)
WBC Count: 3.1 10*3/uL — ABNORMAL LOW (ref 4.0–10.5)
nRBC: 0 % (ref 0.0–0.2)

## 2019-04-14 LAB — CMP (CANCER CENTER ONLY)
ALT: 13 U/L (ref 0–44)
AST: 22 U/L (ref 15–41)
Albumin: 3.1 g/dL — ABNORMAL LOW (ref 3.5–5.0)
Alkaline Phosphatase: 69 U/L (ref 38–126)
Anion gap: 8 (ref 5–15)
BUN: 18 mg/dL (ref 8–23)
CO2: 24 mmol/L (ref 22–32)
Calcium: 9.2 mg/dL (ref 8.9–10.3)
Chloride: 107 mmol/L (ref 98–111)
Creatinine: 0.72 mg/dL (ref 0.44–1.00)
GFR, Est AFR Am: >60 mL/min (ref >60)
GFR, Estimated: >60 mL/min (ref >60)
Glucose, Bld: 143 mg/dL — ABNORMAL HIGH (ref 70–99)
Potassium: 3.9 mmol/L (ref 3.5–5.1)
Sodium: 139 mmol/L (ref 135–145)
Total Bilirubin: 2 mg/dL — ABNORMAL HIGH (ref 0.3–1.2)
Total Protein: 5.2 g/dL — ABNORMAL LOW (ref 6.5–8.1)

## 2019-04-14 LAB — AMMONIA: Ammonia: 72 umol/L — ABNORMAL HIGH (ref 9–35)

## 2019-04-14 NOTE — Progress Notes (Signed)
Hematology and Oncology Follow Up Visit  [name] [id number] July 12, 1941 78 y.o. 04/14/2019   Principle Diagnosis:  1. Recurrent iron deficiency anemia 2. 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 Remote history of stage II (T3, N0, M0) adenocarcinoma of colon 4. 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 She is doing fairly well and is pleasantly confused.   She has a virtual appointment with Dr. [name] next week as well as a follow-up US of the abdomen.  She has had no fever, chills, n/v, cough, rash, dizziness, chest pain, palpitations, abdominal pain or changes in bowel or bladder habits.  She has mild SOB noted at times with exertion.  Ammonia level today is 72.  Her urostomy is functioning appropriately.  No episodes of bleeding, no bruising or petechiae.  Her appetite comes and goes. She does love sweets. She is hydrating well. Her weight is stable.   ECOG Performance Status: 1 - Symptomatic but completely ambulatory  Medications:  Allergies as of 04/14/2019      Reactions   Bee Venom Anaphylaxis   Codeine Anaphylaxis, Rash   Morphine And Related Anaphylaxis, Rash   Phytonadione Other (See Comments)   Pt experienced an episode of cyanosis following dose of Vitamin K 10 mg IV.   Promethazine Hcl Other (See Comments)   Reaction:  Unknown    Penicillins Rash, Other (See Comments)   Has patient had a PCN reaction causing immediate rash, facial/tongue/throat swelling, SOB or lightheadedness with hypotension: No Has patient had a PCN reaction causing severe rash involving mucus membranes or skin necrosis: No Has patient had a PCN reaction that required hospitalization: No Has patient had a PCN reaction occurring within the last 10 years: No If all of the above answers are "NO", then  may proceed with Cephalosporin use.      Medication List       Accurate as of April 14, 2019  1:53 PM. If you have any questions, ask your nurse or doctor.        acidophilus Caps capsule Take 1 capsule by mouth daily.   cholecalciferol 1000 units tablet Commonly known as: VITAMIN D Take 1,000 Units by mouth daily.   Cinnamon 500 MG capsule Take 500 mg by mouth daily.   citalopram 40 MG tablet Commonly known as: CELEXA TAKE 1 TABLET BY MOUTH EVERY DAY   fexofenadine 180 MG tablet Commonly known as: ALLEGRA Take 180 mg by mouth daily.   Generlac 10 GM/15ML Soln Generic drug: lactulose (encephalopathy) TAKE 30 MLS BY MOUTH THREE TIMES DAILY   Ginkgo Biloba 60 MG Tabs Take 60 mg by mouth daily.   lactulose 10 GM/15ML solution Commonly known as: CHRONULAC TAKE 30 MLS BY MOUTH THREE TIMES DAILY   losartan 25 MG tablet Commonly known as: COZAAR Take 25 mg by mouth daily.   losartan 25 MG tablet Commonly known as: COZAAR TAKE 1 TABLET(25 MG) BY MOUTH DAILY   multivitamin with minerals Tabs tablet Take 1 tablet by mouth daily.   omega-3 acid ethyl esters 1 g capsule Commonly known as: LOVAZA Take 1 g by mouth daily.   pantoprazole 40 MG tablet Commonly known as: PROTONIX TAKE 1 TABLET(40 MG) BY MOUTH DAILY   Prevagen Extra Strength 20 MG Caps Generic drug: Apoaequorin Take 20 mg by mouth  daily.   spironolactone 50 MG tablet Commonly known as: ALDACTONE TAKE 1 TABLET(50 MG) BY MOUTH DAILY   vitamin B-12 1000 MCG tablet Commonly known as: CYANOCOBALAMIN Take 1,000 mcg by mouth daily.   vitamin C 1000 MG tablet Take 1,000 mg by mouth daily.   vitamin E 400 UNIT capsule Take 400 Units by mouth daily.       Allergies:  Allergies  Allergen Reactions  . Bee Venom Anaphylaxis  . Codeine Anaphylaxis and Rash  . Morphine And Related Anaphylaxis and Rash  . Phytonadione Other (See Comments)    Pt experienced an episode of cyanosis following dose of  Vitamin K 10 mg IV.  Marland Kitchen Promethazine Hcl Other (See Comments)    Reaction:  Unknown   . Penicillins Rash and Other (See Comments)    Has patient had a PCN reaction causing immediate rash, facial/tongue/throat swelling, SOB or lightheadedness with hypotension: No Has patient had a PCN reaction causing severe rash involving mucus membranes or skin necrosis: No Has patient had a PCN reaction that required hospitalization: No Has patient had a PCN reaction occurring within the last 10 years: No If all of the above answers are "NO", then may proceed with Cephalosporin use.    Past Medical History, Surgical history, Social history, and Family History were reviewed and updated.  Review of Systems: All other 10 point review of systems is negative.   Physical Exam:  vitals were not taken for this visit.   Wt Readings from Last 3 Encounters:  08/12/18 220 lb (99.8 kg)  04/15/18 225 lb (102.1 kg)  12/17/17 230 lb (104.3 kg)    Ocular: Sclerae unicteric, pupils equal, round and reactive to light Ear-nose-throat: Oropharynx clear, dentition fair Lymphatic: No cervical or supraclavicular adenopathy Lungs no rales or rhonchi, good excursion bilaterally Heart regular rate and rhythm, no murmur appreciated Abd soft, nontender, positive bowel sounds, no liver or spleen tip palpated on exam, no fluid wave  MSK no focal spinal tenderness, no joint edema Neuro: non-focal, well-oriented, appropriate affect Breasts: Deferred   Lab Results  Component Value Date   WBC 3.0 (L) 12/16/2018   HGB 11.2 (L) 12/16/2018   HCT 35.0 (L) 12/16/2018   MCV 98.0 12/16/2018   PLT 72 (L) 12/16/2018   Lab Results  Component Value Date   FERRITIN 12 12/16/2018   IRON 65 12/16/2018   TIBC 286 12/16/2018   UIBC 221 12/16/2018   IRONPCTSAT 23 12/16/2018   Lab Results  Component Value Date   RETICCTPCT 2.0 11/23/2014   RBC 3.57 (L) 12/16/2018   RETICCTABS 74.4 11/23/2014   No results found for:  KPAFRELGTCHN, LAMBDASER, KAPLAMBRATIO No results found for: IGGSERUM, IGA, IGMSERUM No results found for: Odetta Pink, SPEI   Chemistry      Component Value Date/Time   NA 142 12/16/2018 1303   NA 141 08/20/2017 1315   K 4.3 12/16/2018 1303   K 4.2 08/20/2017 1315   CL 107 12/16/2018 1303   CL 108 08/20/2017 1315   CO2 29 12/16/2018 1303   CO2 27 08/20/2017 1315   BUN 17 12/16/2018 1303   BUN 14 08/20/2017 1315   CREATININE 0.78 12/16/2018 1303   CREATININE 0.84 09/08/2017 1038      Component Value Date/Time   CALCIUM 9.7 12/16/2018 1303   CALCIUM 10.0 08/20/2017 1315   ALKPHOS 73 12/16/2018 1303   ALKPHOS 73 08/20/2017 1315   AST 22 12/16/2018 1303   ALT  13 12/16/2018 1303   ALT 18 08/20/2017 1315   BILITOT 1.5 (H) 12/16/2018 1303       Impression and Plan: [name] is a very pleasant 78 yo caucasian female with multiple health problems including intermittent iron deficiency anemia, history of stage II colon cancer and stage IV (T3, N1, M0) transitional cell carcinoma of the bladder. She also has NASH and splenomegaly followed by Dr. [name].   We will see what her iron studies show and bring her back in for infusion if needed.  We will plan to see her back in another 4 months. They will contact our office with any questions or concerns. We can certainly see her sooner if needed.    Laverna Peace, NP 7/23/20201:53 PM

## 2019-04-15 LAB — IRON AND TIBC
Iron: 95 ug/dL (ref 41–142)
Saturation Ratios: 30 % (ref 21–57)
TIBC: 319 ug/dL (ref 236–444)
UIBC: 223 ug/dL (ref 120–384)

## 2019-04-15 LAB — FERRITIN: Ferritin: 18 ng/mL (ref 11–307)

## 2019-04-18 ENCOUNTER — Telehealth: Payer: Self-pay | Admitting: Family

## 2019-04-18 NOTE — Telephone Encounter (Signed)
Called and spoke with patient's husband regarding follow up appointment per 7/23 los

## 2019-04-21 DIAGNOSIS — N200 Calculus of kidney: Secondary | ICD-10-CM | POA: Diagnosis not present

## 2019-04-26 ENCOUNTER — Other Ambulatory Visit: Payer: Self-pay | Admitting: Gastroenterology

## 2019-04-26 DIAGNOSIS — K7460 Unspecified cirrhosis of liver: Secondary | ICD-10-CM

## 2019-04-28 DIAGNOSIS — F09 Unspecified mental disorder due to known physiological condition: Secondary | ICD-10-CM | POA: Diagnosis not present

## 2019-04-28 DIAGNOSIS — K7460 Unspecified cirrhosis of liver: Secondary | ICD-10-CM | POA: Diagnosis not present

## 2019-05-09 ENCOUNTER — Ambulatory Visit
Admission: RE | Admit: 2019-05-09 | Discharge: 2019-05-09 | Disposition: A | Discharge status: Home / Self Care | Payer: Medicare Other | Source: Ambulatory Visit | Attending: Gastroenterology | Admitting: Gastroenterology

## 2019-05-09 DIAGNOSIS — K7460 Unspecified cirrhosis of liver: Secondary | ICD-10-CM

## 2019-05-09 DIAGNOSIS — R161 Splenomegaly, not elsewhere classified: Secondary | ICD-10-CM | POA: Diagnosis not present

## 2019-05-17 ENCOUNTER — Other Ambulatory Visit: Payer: Self-pay | Admitting: Hematology & Oncology

## 2019-05-17 ENCOUNTER — Other Ambulatory Visit: Payer: Medicare Other

## 2019-05-17 ENCOUNTER — Other Ambulatory Visit: Payer: Self-pay | Admitting: Family Medicine

## 2019-05-17 ENCOUNTER — Other Ambulatory Visit: Payer: Self-pay

## 2019-05-17 DIAGNOSIS — Z0000 Encounter for general adult medical examination without abnormal findings: Secondary | ICD-10-CM

## 2019-05-17 DIAGNOSIS — D500 Iron deficiency anemia secondary to blood loss (chronic): Secondary | ICD-10-CM

## 2019-05-17 DIAGNOSIS — D696 Thrombocytopenia, unspecified: Secondary | ICD-10-CM

## 2019-05-17 DIAGNOSIS — K7460 Unspecified cirrhosis of liver: Secondary | ICD-10-CM

## 2019-05-18 LAB — COMPREHENSIVE METABOLIC PANEL
AG Ratio: 1.1 (calc) (ref 1.0–2.5)
ALT: 12 U/L (ref 6–29)
AST: 20 U/L (ref 10–35)
Albumin: 3 g/dL — ABNORMAL LOW (ref 3.6–5.1)
Alkaline phosphatase (APISO): 66 U/L (ref 37–153)
BUN: 19 mg/dL (ref 7–25)
CO2: 27 mmol/L (ref 20–32)
Calcium: 10.4 mg/dL (ref 8.6–10.4)
Chloride: 108 mmol/L (ref 98–110)
Creat: 0.85 mg/dL (ref 0.60–0.93)
Globulin: 2.7 g/dL (calc) (ref 1.9–3.7)
Glucose, Bld: 102 mg/dL — ABNORMAL HIGH (ref 65–99)
Potassium: 4.2 mmol/L (ref 3.5–5.3)
Sodium: 142 mmol/L (ref 135–146)
Total Bilirubin: 1.8 mg/dL — ABNORMAL HIGH (ref 0.2–1.2)
Total Protein: 5.7 g/dL — ABNORMAL LOW (ref 6.1–8.1)

## 2019-05-18 LAB — LIPID PANEL
Cholesterol: 118 mg/dL (ref <200)
HDL: 36 mg/dL — ABNORMAL LOW (ref >=50)
LDL Cholesterol (Calc): 67 mg/dL (calc)
Non-HDL Cholesterol (Calc): 82 mg/dL (calc) (ref <130)
Total CHOL/HDL Ratio: 3.3 (calc) (ref <5.0)
Triglycerides: 66 mg/dL (ref <150)

## 2019-05-20 ENCOUNTER — Other Ambulatory Visit: Payer: Self-pay

## 2019-05-20 ENCOUNTER — Ambulatory Visit (INDEPENDENT_AMBULATORY_CARE_PROVIDER_SITE_OTHER): Payer: Medicare Other | Admitting: Family Medicine

## 2019-05-20 ENCOUNTER — Encounter: Payer: Self-pay | Admitting: Family Medicine

## 2019-05-20 VITALS — BP 100/56 | HR 56 | Temp 98.0°F | Resp 20 | Ht 65.0 in | Wt 213.0 lb

## 2019-05-20 DIAGNOSIS — Z0000 Encounter for general adult medical examination without abnormal findings: Secondary | ICD-10-CM

## 2019-05-20 DIAGNOSIS — I10 Essential (primary) hypertension: Secondary | ICD-10-CM | POA: Diagnosis not present

## 2019-05-20 DIAGNOSIS — K7581 Nonalcoholic steatohepatitis (NASH): Secondary | ICD-10-CM

## 2019-05-20 DIAGNOSIS — D61818 Other pancytopenia: Secondary | ICD-10-CM | POA: Diagnosis not present

## 2019-05-20 DIAGNOSIS — Z23 Encounter for immunization: Secondary | ICD-10-CM

## 2019-05-20 DIAGNOSIS — K7460 Unspecified cirrhosis of liver: Secondary | ICD-10-CM

## 2019-05-20 DIAGNOSIS — F0391 Unspecified dementia with behavioral disturbance: Secondary | ICD-10-CM

## 2019-05-20 DIAGNOSIS — Z0001 Encounter for general adult medical examination with abnormal findings: Secondary | ICD-10-CM

## 2019-05-20 NOTE — Progress Notes (Signed)
Subjective:    Patient ID: [name], female    DOB: Jan 03, 1941, 78 y.o.   MRN: [id number]  HPI   Patient is here today for  complete physical exam.  She is here today with her husband.  Her dementia seems to be worsening.  Patient is awake and alert but unable to answer any of my questions.  She can follow basic commands however when I asked her questions, she is unable to answer them intelligibly.  She often mumbles or answers questions inappropriately.  Her husband is providing all of her care.  She is due for a flu shot.  Due to her age and medical comorbidities, I do not recommend a mammogram or Pap smear.  Husband denies any falls.  She is already on medication for depression.  He states that her appetite has declined.  He is giving her 1 boost a day.    She has a very complicated past medical history. She has a history of colon cancer. In 2001 she underwent a hemicolectomy for colon cancer.She also has a history of bladder cancer. Her bladder was removed in 2008. She wears a bag chronically. She follows up every 6 months with Dr. Jeffie Pollock.  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 This has caused leukopenia with a white count between 2 and 3, thrombocytopenia. She also has occasional encephalopathy secondary to cirrhosis with an elevated ammonia level. She also has a history of iron deficiency anemia. She follows up regularly with Dr. [name] for iron transfusions.  Colonoscopy is up-to-date. She does not require a Pap smear due to age and the fact she's had a hysterectomy.   Immunization History  Administered Date(s) Administered  . Fluad Quad(high Dose 65+) 05/20/2019  . Influenza, High Dose Seasonal PF 07/05/2014  . Influenza, Quadrivalent, Recombinant, Inj, Pf 09/01/2013  . Influenza,inj,Quad PF,6+ Mos 09/01/2013, 05/31/2015, 09/09/2016, 08/20/2017, 08/12/2018  . Influenza,trivalent, recombinat, inj, PF 07/05/2014  .  Influenza-Unspecified 06/10/2012, 09/01/2013, 07/05/2014  . Pneumococcal Conjugate-13 06/18/2015  . Pneumococcal Polysaccharide-23 12/07/2009    Past Medical History:  Diagnosis Date  . Anemia, iron deficiency   . Arthritis   . Bladder cancer [facility]) 2003   Bladder removed - [name]  . Cataract immature   . Cirrhosis ([place]) 08/26/2011  . Colon cancer [facility]) 2001   Hemicolectomy - [name]  . Depression   . GERD (gastroesophageal reflux disease)   . Headache(784.0)   . Hemorrhoids   . History of colon polyps   . History of DVT (deep vein thrombosis) 50+yrs ago   With pregnancy  . History of GI bleed   . History of kidney stones   . History of urostomy    Urostomy d/t hx bladder cancer  . Hx of transfusion of whole blood   . Hypertension   . Joint pain   . Memory loss    Chemotherapy; short and long term  . Osteoporosis   . Thrombocytopenia ([place])    Past Surgical History:  Procedure Laterality Date  . ABDOMINAL HYSTERECTOMY    . APPENDECTOMY    . BLADDER SURGERY     bladder removed d/t cancer  . CHOLECYSTECTOMY    . COLON RESECTION     with colostomy then colostomy reversal  . COLON SURGERY  2004  . COLON SURGERY  2001  . COLONOSCOPY    . ESOPHAGOGASTRODUODENOSCOPY  02/27/2012   Procedure: ESOPHAGOGASTRODUODENOSCOPY (EGD);  Surgeon: [name], MD;  Location:  [facility] END[facility]COPY;  Service: Endoscopy;  Laterality: N/A;  bedside case  . NEPHROLITHOTOMY Left 05/04/2014   Procedure: LEFT PERCUTANEOUS NEPHROLITHOTOMY ;  Surgeon: [name], MD;  Location: [facility] ORS;  Service: Urology;  Laterality: Left;  . TOTAL KNEE ARTHROPLASTY  2010   Left knee   Current Outpatient Medications on File Prior to Visit  Medication Sig Dispense Refill  . acidophilus (RISAQUAD) CAPS capsule Take 1 capsule by mouth daily.    Marland Kitchen Apoaequorin (PREVAGEN EXTRA STRENGTH) 20 MG CAPS Take 20 mg by mouth daily.    . Ascorbic Acid (VITAMIN C) 1000 MG tablet Take 1,000 mg by mouth daily.    . cholecalciferol  (VITAMIN D) 1000 units tablet Take 1,000 Units by mouth daily.    . Cinnamon 500 MG capsule Take 500 mg by mouth daily.     . citalopram (CELEXA) 40 MG tablet TAKE 1 TABLET BY MOUTH EVERY DAY 90 tablet 3  . fexofenadine (ALLEGRA) 180 MG tablet Take 180 mg by mouth daily.    Marland Kitchen GENERLAC 10 GM/15ML SOLN TAKE 30 MLS BY MOUTH THREE TIMES DAILY 240 mL 0  . Ginkgo Biloba 60 MG TABS Take 60 mg by mouth daily.     Marland Kitchen lactulose (CHRONULAC) 10 GM/15ML solution TAKE 30 MLS BY MOUTH THREE TIMES DAILY 1000 mL 12  . losartan (COZAAR) 25 MG tablet Take 25 mg by mouth daily.    Marland Kitchen losartan (COZAAR) 25 MG tablet TAKE 1 TABLET(25 MG) BY MOUTH DAILY 90 tablet 2  . Multiple Vitamin (MULTIVITAMIN WITH MINERALS) TABS tablet Take 1 tablet by mouth daily.    Marland Kitchen omega-3 acid ethyl esters (LOVAZA) 1 g capsule Take 1 g by mouth daily.    . pantoprazole (PROTONIX) 40 MG tablet TAKE 1 TABLET(40 MG) BY MOUTH DAILY 90 tablet 3  . spironolactone (ALDACTONE) 50 MG tablet TAKE 1 TABLET(50 MG) BY MOUTH DAILY 90 tablet 0  . vitamin B-12 (CYANOCOBALAMIN) 1000 MCG tablet Take 1,000 mcg by mouth daily.    . vitamin E 400 UNIT capsule Take 400 Units by mouth daily.     No current facility-administered medications on file prior to visit.    Allergies  Allergen Reactions  . Bee Venom Anaphylaxis  . Codeine Anaphylaxis and Rash  . Morphine And Related Anaphylaxis and Rash  . Phytonadione Other (See Comments)    Pt experienced an episode of cyanosis following dose of Vitamin K 10 mg IV.  Marland Kitchen Penicillins Rash and Other (See Comments)    Has patient had a PCN reaction causing immediate rash, facial/tongue/throat swelling, SOB or lightheadedness with hypotension: No Has patient had a PCN reaction causing severe rash involving mucus membranes or skin necrosis: No Has patient had a PCN reaction that required hospitalization: No Has patient had a PCN reaction occurring within the last 10 years: No If all of the above answers are "NO", then  may proceed with Cephalosporin use.  . Promethazine Hcl Other (See Comments)    Reaction:  Unknown    Social History   Socioeconomic History  . Marital status: Married    Spouse name: Not on file  . Number of children: Not on file  . Years of education: Not on file  . Highest education level: Not on file  Occupational History  . Not on file  Social Needs  . Financial resource strain: Not on file  . Food insecurity    Worry: Not on file    Inability: Not on file  .  Transportation needs    Medical: Not on file    Non-medical: Not on file  Tobacco Use  . Smoking status: Former Smoker    Packs/day: 0.50    Years: 35.00    Pack years: 17.50    Types: Cigarettes    Start date: 10/25/1955    Quit date: 09/22/1991    Years since quitting: 27.6  . Smokeless tobacco: Never Used  . Tobacco comment: quit 23years ago  Substance and Sexual Activity  . Alcohol use: No    Alcohol/week: 0.0 standard drinks  . Drug use: No  . Sexual activity: Yes    Birth control/protection: Surgical  Lifestyle  . Physical activity    Days per week: Not on file    Minutes per session: Not on file  . Stress: Not on file  Relationships  . Social Herbalist on phone: Not on file    Gets together: Not on file    Attends religious service: Not on file    Active member of club or organization: Not on file    Attends meetings of clubs or organizations: Not on file    Relationship status: Not on file  . Intimate partner violence    Fear of current or ex partner: Not on file    Emotionally abused: Not on file    Physically abused: Not on file    Forced sexual activity: Not on file  Other Topics Concern  . Not on file  Social History Narrative   Lives at home with her husband, ambulatory   Family History  Problem Relation Age of Onset  . Diabetes Brother   . Cancer Other   . Hypertension Other       Review of Systems  All other systems reviewed and are negative.      Objective:    Physical Exam  Constitutional: She appears well-developed and well-nourished. No distress.  HENT:  Nose: Nose normal.  Mouth/Throat: Oropharynx is clear and moist.  Eyes: Conjunctivae are normal. Right eye exhibits no discharge. Left eye exhibits no discharge. No scleral icterus.  Neck: Neck supple. No thyromegaly present.  Cardiovascular: Normal rate and regular rhythm.  Murmur heard. Pulmonary/Chest: Effort normal and breath sounds normal. No respiratory distress. She has no wheezes. She has no rales.  Abdominal: Soft. Bowel sounds are normal. She exhibits no distension and no mass. There is no abdominal tenderness. There is no rebound and no guarding.  Musculoskeletal: Normal range of motion.        General: No tenderness or edema.  Lymphadenopathy:    She has no cervical adenopathy.  Neurological: She is alert. She has normal reflexes. No cranial nerve deficit. She exhibits normal muscle tone. Coordination normal.  Skin: She is not diaphoretic.  Psychiatric: Cognition and memory are impaired.  Vitals reviewed.         Assessment & Plan:  Needs flu shot - Plan: Flu Vaccine QUAD High Dose(Fluad)  General medical exam  Dementia with behavioral disturbance, unspecified dementia type ([place])  Pancytopenia ([place])  Essential hypertension  Liver cirrhosis secondary to nonalcoholic steatohepatitis (NASH) ([place])  Patient received her flu shot today.  Immunization History  Administered Date(s) Administered  . Fluad Quad(high Dose 65+) 05/20/2019  . Influenza, High Dose Seasonal PF 07/05/2014  . Influenza, Quadrivalent, Recombinant, Inj, Pf 09/01/2013  . Influenza,inj,Quad PF,6+ Mos 09/01/2013, 05/31/2015, 09/09/2016, 08/20/2017, 08/12/2018  . Influenza,trivalent, recombinat, inj, PF 07/05/2014  . Influenza-Unspecified 06/10/2012, 09/01/2013, 07/05/2014  . Pneumococcal Conjugate-13  06/18/2015  . Pneumococcal Polysaccharide-23 12/07/2009   Remainder of her immunizations are up  to date.  Discouraged mammogram or pap smear.  DEXA is up to date.  Most recent labs are listed below: Lab on 05/17/2019  Component Date Value Ref Range Status  . Glucose, Bld 05/17/2019 102* 65 - 99 mg/dL Final   Comment: .            Fasting reference interval . For someone without known diabetes, a glucose value between 100 and 125 mg/dL is consistent with prediabetes and should be confirmed with a follow-up test. .   . BUN 05/17/2019 19  7 - 25 mg/dL Final  . Creat 05/17/2019 0.85  0.60 - 0.93 mg/dL Final   Comment: For patients >66 years of age, the reference limit for Creatinine is approximately 13% higher for people identified as African-American. .   Havery Moros Ratio 96/29/5284 NOT APPLICABLE  6 - 22 (calc) Final  . Sodium 05/17/2019 142  135 - 146 mmol/L Final  . Potassium 05/17/2019 4.2  3.5 - 5.3 mmol/L Final  . Chloride 05/17/2019 108  98 - 110 mmol/L Final  . CO2 05/17/2019 27  20 - 32 mmol/L Final  . Calcium 05/17/2019 10.4  8.6 - 10.4 mg/dL Final  . Total Protein 05/17/2019 5.7* 6.1 - 8.1 g/dL Final  . Albumin 05/17/2019 3.0* 3.6 - 5.1 g/dL Final  . Globulin 05/17/2019 2.7  1.9 - 3.7 g/dL (calc) Final  . AG Ratio 05/17/2019 1.1  1.0 - 2.5 (calc) Final  . Total Bilirubin 05/17/2019 1.8* 0.2 - 1.2 mg/dL Final  . Alkaline phosphatase (APISO) 05/17/2019 66  37 - 153 U/L Final  . AST 05/17/2019 20  10 - 35 U/L Final  . ALT 05/17/2019 12  6 - 29 U/L Final  . Cholesterol 05/17/2019 118  <200 mg/dL Final  . HDL 05/17/2019 36* > OR = 50 mg/dL Final  . Triglycerides 05/17/2019 66  <150 mg/dL Final  . LDL Cholesterol (Calc) 05/17/2019 67  mg/dL (calc) Final   Comment: Reference range: <100 . Desirable range <100 mg/dL for primary prevention;   <70 mg/dL for patients with CHD or diabetic patients  with > or = 2 CHD risk factors. Marland Kitchen LDL-C is now calculated using the Martin-Hopkins  calculation, which is a validated novel method providing  better accuracy than the  Friedewald equation in the  estimation of LDL-C.  Cresenciano Genre et al. Annamaria Helling. 1324;401(02): 2061-2068  (http://education.[facility].com/faq/FAQ164)   . Total CHOL/HDL Ratio 05/17/2019 3.3  <5.0 (calc) Final  . Non-HDL Cholesterol (Calc) 05/17/2019 82  <130 mg/dL (calc) Final   Comment: For patients with diabetes plus 1 major ASCVD risk  factor, treating to a non-HDL-C goal of <100 mg/dL  (LDL-C of <70 mg/dL) is considered a therapeutic  option.    Labs are excellent.  I encouraged increasing boost to 1 can twice a day due to protein calorie malnutrition. Otherwise, regular anticipatory guidance provided.

## 2019-08-15 ENCOUNTER — Inpatient Hospital Stay (HOSPITAL_BASED_OUTPATIENT_CLINIC_OR_DEPARTMENT_OTHER): Payer: Medicare Other | Admitting: Family

## 2019-08-15 ENCOUNTER — Other Ambulatory Visit: Payer: Self-pay | Admitting: Family

## 2019-08-15 ENCOUNTER — Other Ambulatory Visit: Payer: Self-pay

## 2019-08-15 ENCOUNTER — Encounter: Payer: Self-pay | Admitting: Family

## 2019-08-15 ENCOUNTER — Inpatient Hospital Stay: Payer: Medicare Other | Attending: Family

## 2019-08-15 VITALS — BP 103/42 | HR 53 | Temp 97.9°F | Resp 17 | Wt 208.0 lb

## 2019-08-15 DIAGNOSIS — K7581 Nonalcoholic steatohepatitis (NASH): Secondary | ICD-10-CM | POA: Diagnosis not present

## 2019-08-15 DIAGNOSIS — D6959 Other secondary thrombocytopenia: Secondary | ICD-10-CM | POA: Diagnosis not present

## 2019-08-15 DIAGNOSIS — C182 Malignant neoplasm of ascending colon: Secondary | ICD-10-CM

## 2019-08-15 DIAGNOSIS — K1379 Other lesions of oral mucosa: Secondary | ICD-10-CM | POA: Diagnosis not present

## 2019-08-15 DIAGNOSIS — D72819 Decreased white blood cell count, unspecified: Secondary | ICD-10-CM | POA: Insufficient documentation

## 2019-08-15 DIAGNOSIS — R5383 Other fatigue: Secondary | ICD-10-CM | POA: Diagnosis not present

## 2019-08-15 DIAGNOSIS — D509 Iron deficiency anemia, unspecified: Secondary | ICD-10-CM | POA: Insufficient documentation

## 2019-08-15 DIAGNOSIS — C679 Malignant neoplasm of bladder, unspecified: Secondary | ICD-10-CM | POA: Diagnosis not present

## 2019-08-15 DIAGNOSIS — D696 Thrombocytopenia, unspecified: Secondary | ICD-10-CM

## 2019-08-15 DIAGNOSIS — D500 Iron deficiency anemia secondary to blood loss (chronic): Secondary | ICD-10-CM | POA: Diagnosis not present

## 2019-08-15 LAB — CBC WITH DIFFERENTIAL (CANCER CENTER ONLY)
Abs Immature Granulocytes: 0 10*3/uL (ref 0.00–0.07)
Basophils Absolute: 0 10*3/uL (ref 0.0–0.1)
Basophils Relative: 1 %
Eosinophils Absolute: 0.1 10*3/uL (ref 0.0–0.5)
Eosinophils Relative: 3 %
HCT: 33.2 % — ABNORMAL LOW (ref 36.0–46.0)
Hemoglobin: 10.7 g/dL — ABNORMAL LOW (ref 12.0–15.0)
Immature Granulocytes: 0 %
Lymphocytes Relative: 23 %
Lymphs Abs: 0.6 10*3/uL — ABNORMAL LOW (ref 0.7–4.0)
MCH: 30.6 pg (ref 26.0–34.0)
MCHC: 32.2 g/dL (ref 30.0–36.0)
MCV: 94.9 fL (ref 80.0–100.0)
Monocytes Absolute: 0.3 10*3/uL (ref 0.1–1.0)
Monocytes Relative: 12 %
Neutro Abs: 1.6 10*3/uL — ABNORMAL LOW (ref 1.7–7.7)
Neutrophils Relative %: 61 %
Platelet Count: 46 10*3/uL — ABNORMAL LOW (ref 150–400)
RBC: 3.5 MIL/uL — ABNORMAL LOW (ref 3.87–5.11)
RDW: 16.9 % — ABNORMAL HIGH (ref 11.5–15.5)
WBC Count: 2.6 10*3/uL — ABNORMAL LOW (ref 4.0–10.5)
nRBC: 0 % (ref 0.0–0.2)

## 2019-08-15 LAB — CMP (CANCER CENTER ONLY)
ALT: 12 U/L (ref 0–44)
AST: 20 U/L (ref 15–41)
Albumin: 3.3 g/dL — ABNORMAL LOW (ref 3.5–5.0)
Alkaline Phosphatase: 66 U/L (ref 38–126)
Anion gap: 6 (ref 5–15)
BUN: 22 mg/dL (ref 8–23)
CO2: 27 mmol/L (ref 22–32)
Calcium: 10.2 mg/dL (ref 8.9–10.3)
Chloride: 108 mmol/L (ref 98–111)
Creatinine: 0.8 mg/dL (ref 0.44–1.00)
GFR, Est AFR Am: >60 mL/min (ref >60)
GFR, Estimated: >60 mL/min (ref >60)
Glucose, Bld: 146 mg/dL — ABNORMAL HIGH (ref 70–99)
Potassium: 4 mmol/L (ref 3.5–5.1)
Sodium: 141 mmol/L (ref 135–145)
Total Bilirubin: 2 mg/dL — ABNORMAL HIGH (ref 0.3–1.2)
Total Protein: 5.8 g/dL — ABNORMAL LOW (ref 6.5–8.1)

## 2019-08-15 LAB — AMMONIA: Ammonia: 69 umol/L — ABNORMAL HIGH (ref 9–35)

## 2019-08-15 MED ORDER — MAGIC MOUTHWASH W/LIDOCAINE: 5.0000 mL | Freq: Three times a day (TID) | ORAL | 1 refills | Status: AC | PRN

## 2019-08-15 NOTE — Progress Notes (Signed)
Hematology and Oncology Follow Up Visit  ERVIN Robertson [id number] 14-Jan-1941 78 y.o. 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 is here today with her husband for follow-up. She is doing fairly well.  She has some fatigue and will nap when needed.  She does not always eat well but does drink 1-2 Boost daily. Sometimes her mouth gets sore. Exam today was negative.  Hgb is stable at 10.7, platelets are 46 and WBC count 2.6.  She has not had any episodes of bleeding. No bruising or petechiae.  No fever, chills, n/v, cough, rash, dizziness, SOB, chest pain, palpitations, abdominal pain or changes in bowel or bladder habits.  She has arthritis and sometimes her hands are sore.  No swelling, numbness or tingling in her extremities.  No falls or syncopal episodes to report.   ECOG Performance Status: 1 - Symptomatic but completely ambulatory  Medications:  Allergies as of 08/15/2019      Reactions   Bee Venom Anaphylaxis   Codeine Anaphylaxis, Rash   Morphine And Related Anaphylaxis, Rash   Phytonadione Other (See Comments)   Pt experienced an episode of cyanosis following dose of Vitamin K 10 mg IV.   Penicillins Rash, Other (See Comments)   Has patient had a PCN reaction causing immediate rash, facial/tongue/throat swelling, SOB or lightheadedness with hypotension: No Has patient had a PCN reaction causing severe rash involving mucus membranes or skin necrosis: No Has patient had a PCN reaction that required hospitalization: No Has patient had a PCN reaction occurring within the last 10 years: No If all of the above answers are "NO", then may proceed with Cephalosporin use.   Promethazine Hcl Other (See Comments)   Reaction:  Unknown       Medication List       Accurate as of August 15, 2019  1:37 PM. If you have any questions, ask your nurse or doctor.        acidophilus Caps capsule Take 1 capsule by mouth daily.   cholecalciferol 1000 units tablet Commonly known as: VITAMIN D Take 1,000 Units by mouth daily.   Cinnamon 500 MG capsule Take 500 mg by mouth daily.   citalopram 40 MG tablet Commonly known as: CELEXA TAKE 1 TABLET BY MOUTH EVERY DAY   fexofenadine 180 MG tablet Commonly known as: ALLEGRA Take 180 mg by mouth daily.   Generlac 10 GM/15ML Soln Generic drug: lactulose (encephalopathy) TAKE 30 MLS BY MOUTH THREE TIMES DAILY   Ginkgo Biloba 60 MG Tabs Take 60 mg by mouth daily.   lactulose 10 GM/15ML solution Commonly known as: CHRONULAC TAKE 30 MLS BY MOUTH THREE TIMES DAILY   losartan 25 MG tablet Commonly known as: COZAAR Take 25 mg by mouth daily.   losartan 25 MG tablet Commonly known as: COZAAR TAKE 1 TABLET(25 MG) BY MOUTH DAILY   multivitamin with minerals Tabs tablet Take 1 tablet by mouth daily.   omega-3 acid ethyl esters 1 g capsule Commonly known as: LOVAZA Take 1 g by mouth daily.   pantoprazole 40 MG tablet Commonly known as: PROTONIX TAKE 1 TABLET(40 MG) BY MOUTH DAILY   Prevagen Extra Strength 20 MG Caps Generic drug: Apoaequorin Take 20 mg by mouth daily.  spironolactone 50 MG tablet Commonly known as: ALDACTONE TAKE 1 TABLET(50 MG) BY MOUTH DAILY   vitamin B-12 1000 MCG tablet Commonly known as: CYANOCOBALAMIN Take 1,000 mcg by mouth daily.   vitamin C 1000 MG tablet Take 1,000 mg by mouth daily.   vitamin E 400 UNIT capsule Take 400 Units by mouth daily.       Allergies:  Allergies  Allergen Reactions  . Bee Venom Anaphylaxis  . Codeine Anaphylaxis and Rash  . Morphine And Related Anaphylaxis and Rash  . Phytonadione Other (See Comments)    Pt experienced an episode of  cyanosis following dose of Vitamin K 10 mg IV.  Marland Kitchen Penicillins Rash and Other (See Comments)    Has patient had a PCN reaction causing immediate rash, facial/tongue/throat swelling, SOB or lightheadedness with hypotension: No Has patient had a PCN reaction causing severe rash involving mucus membranes or skin necrosis: No Has patient had a PCN reaction that required hospitalization: No Has patient had a PCN reaction occurring within the last 10 years: No If all of the above answers are "NO", then may proceed with Cephalosporin use.  . Promethazine Hcl Other (See Comments)    Reaction:  Unknown     Past Medical History, Surgical history, Social history, and Family History were reviewed and updated.  Review of Systems: All other 10 point review of systems is negative.   Physical Exam:  vitals were not taken for this visit.   Wt Readings from Last 3 Encounters:  05/20/19 213 lb (96.6 kg)  04/14/19 215 lb (97.5 kg)  08/12/18 220 lb (99.8 kg)    Ocular: Sclerae unicteric, pupils equal, round and reactive to light Ear-nose-throat: Oropharynx clear, dentition fair Lymphatic: No cervical or supraclavicular adenopathy Lungs no rales or rhonchi, good excursion bilaterally Heart regular rate and rhythm, no murmur appreciated Abd soft, nontender, positive bowel sounds, no liver or spleen tip palpated on exam, no fluid wave  MSK no focal spinal tenderness, no joint edema Neuro: non-focal, well-oriented, appropriate affect Breasts: Deferred   Lab Results  Component Value Date   WBC 2.6 (L) 08/15/2019   HGB 10.7 (L) 08/15/2019   HCT 33.2 (L) 08/15/2019   MCV 94.9 08/15/2019   PLT 46 (L) 08/15/2019   Lab Results  Component Value Date   FERRITIN 18 04/14/2019   IRON 95 04/14/2019   TIBC 319 04/14/2019   UIBC 223 04/14/2019   IRONPCTSAT 30 04/14/2019   Lab Results  Component Value Date   RETICCTPCT 2.0 11/23/2014   RBC 3.50 (L) 08/15/2019   RETICCTABS 74.4 11/23/2014   No  results found for: KPAFRELGTCHN, LAMBDASER, KAPLAMBRATIO No results found for: IGGSERUM, IGA, IGMSERUM No results found for: Odetta Pink, SPEI   Chemistry      Component Value Date/Time   NA 142 05/17/2019 1116   NA 141 08/20/2017 1315   K 4.2 05/17/2019 1116   K 4.2 08/20/2017 1315   CL 108 05/17/2019 1116   CL 108 08/20/2017 1315   CO2 27 05/17/2019 1116   CO2 27 08/20/2017 1315   BUN 19 05/17/2019 1116   BUN 14 08/20/2017 1315   CREATININE 0.85 05/17/2019 1116      Component Value Date/Time   CALCIUM 10.4 05/17/2019 1116   CALCIUM 10.0 08/20/2017 1315   ALKPHOS 69 04/14/2019 1317   ALKPHOS 73 08/20/2017 1315   AST 20 05/17/2019 1116   AST 22 04/14/2019 1317   ALT 12 05/17/2019 1116  ALT 13 04/14/2019 1317   ALT 18 08/20/2017 1315   BILITOT 1.8 (H) 05/17/2019 1116   BILITOT 2.0 (H) 04/14/2019 1317       Impression and Plan: Ms. Parisi is a very pleasant 78 yo caucasian female with multiple health problems including intermittent iron deficiency anemia, history of stage II colon cancer and stage IV (T3, N1, M0) transitional cell carcinoma of the bladder.  She also has NASH and splenomegaly followed by Dr. [name].   Ammonia level is pending.  We will see her her iron studies show and bring her back in for infusion if needed.  She is asymptomatic with her platelet count at this time.  Prescription for magic mouth wash faxed to her pharmacy.  We will plan to see her back in another 6 months.  Her husband will contact our office with any questions or concerns. We can certainly see her sooner if needed.   Laverna Peace, NP 11/23/20201:37 PM

## 2019-08-16 LAB — IRON AND TIBC
Iron: 88 ug/dL (ref 41–142)
Saturation Ratios: 29 % (ref 21–57)
TIBC: 300 ug/dL (ref 236–444)
UIBC: 212 ug/dL (ref 120–384)

## 2019-08-16 LAB — FERRITIN: Ferritin: 16 ng/mL (ref 11–307)

## 2019-08-18 ENCOUNTER — Other Ambulatory Visit: Payer: Self-pay | Admitting: Hematology & Oncology

## 2019-08-18 DIAGNOSIS — D696 Thrombocytopenia, unspecified: Secondary | ICD-10-CM

## 2019-08-18 DIAGNOSIS — D500 Iron deficiency anemia secondary to blood loss (chronic): Secondary | ICD-10-CM

## 2019-08-18 DIAGNOSIS — K7460 Unspecified cirrhosis of liver: Secondary | ICD-10-CM

## 2019-09-05 ENCOUNTER — Other Ambulatory Visit: Payer: Self-pay | Admitting: Family Medicine

## 2019-09-05 DIAGNOSIS — I10 Essential (primary) hypertension: Secondary | ICD-10-CM

## 2019-09-05 MED ORDER — LOSARTAN POTASSIUM 25 MG PO TABS: ORAL_TABLET | ORAL | 2 refills | Status: DC

## 2019-10-12 ENCOUNTER — Encounter (HOSPITAL_COMMUNITY): Payer: Self-pay | Admitting: Emergency Medicine

## 2019-10-12 ENCOUNTER — Inpatient Hospital Stay (HOSPITAL_COMMUNITY)
Admission: EM | Admit: 2019-10-12 | Discharge: 2019-10-17 | DRG: 871 | Disposition: A | Discharge status: Home Health | Payer: Medicare Other | Attending: Internal Medicine | Admitting: Internal Medicine

## 2019-10-12 ENCOUNTER — Other Ambulatory Visit: Payer: Self-pay

## 2019-10-12 ENCOUNTER — Emergency Department (HOSPITAL_COMMUNITY): Payer: Medicare Other

## 2019-10-12 DIAGNOSIS — K766 Portal hypertension: Secondary | ICD-10-CM | POA: Diagnosis present

## 2019-10-12 DIAGNOSIS — D696 Thrombocytopenia, unspecified: Secondary | ICD-10-CM | POA: Diagnosis present

## 2019-10-12 DIAGNOSIS — M810 Age-related osteoporosis without current pathological fracture: Secondary | ICD-10-CM | POA: Diagnosis present

## 2019-10-12 DIAGNOSIS — D6959 Other secondary thrombocytopenia: Secondary | ICD-10-CM | POA: Diagnosis present

## 2019-10-12 DIAGNOSIS — Z85038 Personal history of other malignant neoplasm of large intestine: Secondary | ICD-10-CM | POA: Diagnosis not present

## 2019-10-12 DIAGNOSIS — K7030 Alcoholic cirrhosis of liver without ascites: Secondary | ICD-10-CM | POA: Diagnosis not present

## 2019-10-12 DIAGNOSIS — R410 Disorientation, unspecified: Secondary | ICD-10-CM | POA: Diagnosis not present

## 2019-10-12 DIAGNOSIS — I8510 Secondary esophageal varices without bleeding: Secondary | ICD-10-CM | POA: Diagnosis present

## 2019-10-12 DIAGNOSIS — D62 Acute posthemorrhagic anemia: Secondary | ICD-10-CM | POA: Diagnosis present

## 2019-10-12 DIAGNOSIS — K625 Hemorrhage of anus and rectum: Secondary | ICD-10-CM | POA: Diagnosis present

## 2019-10-12 DIAGNOSIS — F0390 Unspecified dementia without behavioral disturbance: Secondary | ICD-10-CM | POA: Diagnosis present

## 2019-10-12 DIAGNOSIS — Z90710 Acquired absence of both cervix and uterus: Secondary | ICD-10-CM

## 2019-10-12 DIAGNOSIS — D684 Acquired coagulation factor deficiency: Secondary | ICD-10-CM | POA: Diagnosis present

## 2019-10-12 DIAGNOSIS — Z833 Family history of diabetes mellitus: Secondary | ICD-10-CM

## 2019-10-12 DIAGNOSIS — D689 Coagulation defect, unspecified: Secondary | ICD-10-CM | POA: Diagnosis not present

## 2019-10-12 DIAGNOSIS — K6389 Other specified diseases of intestine: Secondary | ICD-10-CM | POA: Diagnosis present

## 2019-10-12 DIAGNOSIS — R0902 Hypoxemia: Secondary | ICD-10-CM | POA: Diagnosis not present

## 2019-10-12 DIAGNOSIS — D509 Iron deficiency anemia, unspecified: Secondary | ICD-10-CM | POA: Diagnosis present

## 2019-10-12 DIAGNOSIS — Z8249 Family history of ischemic heart disease and other diseases of the circulatory system: Secondary | ICD-10-CM

## 2019-10-12 DIAGNOSIS — A419 Sepsis, unspecified organism: Principal | ICD-10-CM | POA: Diagnosis present

## 2019-10-12 DIAGNOSIS — Z20822 Contact with and (suspected) exposure to covid-19: Secondary | ICD-10-CM | POA: Diagnosis present

## 2019-10-12 DIAGNOSIS — R319 Hematuria, unspecified: Secondary | ICD-10-CM | POA: Diagnosis not present

## 2019-10-12 DIAGNOSIS — J9601 Acute respiratory failure with hypoxia: Secondary | ICD-10-CM | POA: Diagnosis present

## 2019-10-12 DIAGNOSIS — C189 Malignant neoplasm of colon, unspecified: Secondary | ICD-10-CM | POA: Diagnosis not present

## 2019-10-12 DIAGNOSIS — I10 Essential (primary) hypertension: Secondary | ICD-10-CM | POA: Diagnosis present

## 2019-10-12 DIAGNOSIS — Z888 Allergy status to other drugs, medicaments and biological substances status: Secondary | ICD-10-CM

## 2019-10-12 DIAGNOSIS — Z79899 Other long term (current) drug therapy: Secondary | ICD-10-CM

## 2019-10-12 DIAGNOSIS — J189 Pneumonia, unspecified organism: Secondary | ICD-10-CM | POA: Diagnosis present

## 2019-10-12 DIAGNOSIS — Z9221 Personal history of antineoplastic chemotherapy: Secondary | ICD-10-CM

## 2019-10-12 DIAGNOSIS — K922 Gastrointestinal hemorrhage, unspecified: Secondary | ICD-10-CM

## 2019-10-12 DIAGNOSIS — K7469 Other cirrhosis of liver: Secondary | ICD-10-CM | POA: Diagnosis present

## 2019-10-12 DIAGNOSIS — C679 Malignant neoplasm of bladder, unspecified: Secondary | ICD-10-CM | POA: Diagnosis present

## 2019-10-12 DIAGNOSIS — R1111 Vomiting without nausea: Secondary | ICD-10-CM | POA: Diagnosis not present

## 2019-10-12 DIAGNOSIS — N390 Urinary tract infection, site not specified: Secondary | ICD-10-CM | POA: Diagnosis not present

## 2019-10-12 DIAGNOSIS — Z880 Allergy status to penicillin: Secondary | ICD-10-CM

## 2019-10-12 DIAGNOSIS — Z9049 Acquired absence of other specified parts of digestive tract: Secondary | ICD-10-CM

## 2019-10-12 DIAGNOSIS — K921 Melena: Secondary | ICD-10-CM | POA: Diagnosis not present

## 2019-10-12 DIAGNOSIS — D500 Iron deficiency anemia secondary to blood loss (chronic): Secondary | ICD-10-CM | POA: Diagnosis not present

## 2019-10-12 DIAGNOSIS — Z885 Allergy status to narcotic agent status: Secondary | ICD-10-CM

## 2019-10-12 DIAGNOSIS — Z7189 Other specified counseling: Secondary | ICD-10-CM | POA: Diagnosis not present

## 2019-10-12 DIAGNOSIS — Z8719 Personal history of other diseases of the digestive system: Secondary | ICD-10-CM

## 2019-10-12 DIAGNOSIS — D61818 Other pancytopenia: Secondary | ICD-10-CM

## 2019-10-12 DIAGNOSIS — Z66 Do not resuscitate: Secondary | ICD-10-CM | POA: Diagnosis present

## 2019-10-12 DIAGNOSIS — K219 Gastro-esophageal reflux disease without esophagitis: Secondary | ICD-10-CM | POA: Diagnosis present

## 2019-10-12 DIAGNOSIS — Z86718 Personal history of other venous thrombosis and embolism: Secondary | ICD-10-CM

## 2019-10-12 DIAGNOSIS — Z906 Acquired absence of other parts of urinary tract: Secondary | ICD-10-CM | POA: Diagnosis not present

## 2019-10-12 DIAGNOSIS — Z936 Other artificial openings of urinary tract status: Secondary | ICD-10-CM

## 2019-10-12 DIAGNOSIS — R001 Bradycardia, unspecified: Secondary | ICD-10-CM | POA: Diagnosis not present

## 2019-10-12 DIAGNOSIS — K7581 Nonalcoholic steatohepatitis (NASH): Secondary | ICD-10-CM | POA: Diagnosis present

## 2019-10-12 DIAGNOSIS — K7460 Unspecified cirrhosis of liver: Secondary | ICD-10-CM | POA: Diagnosis not present

## 2019-10-12 DIAGNOSIS — Z96652 Presence of left artificial knee joint: Secondary | ICD-10-CM | POA: Diagnosis present

## 2019-10-12 DIAGNOSIS — Z8619 Personal history of other infectious and parasitic diseases: Secondary | ICD-10-CM

## 2019-10-12 DIAGNOSIS — R161 Splenomegaly, not elsewhere classified: Secondary | ICD-10-CM | POA: Diagnosis present

## 2019-10-12 DIAGNOSIS — R531 Weakness: Secondary | ICD-10-CM | POA: Diagnosis not present

## 2019-10-12 DIAGNOSIS — Z515 Encounter for palliative care: Secondary | ICD-10-CM | POA: Diagnosis not present

## 2019-10-12 DIAGNOSIS — K644 Residual hemorrhoidal skin tags: Secondary | ICD-10-CM | POA: Diagnosis present

## 2019-10-12 DIAGNOSIS — Z87891 Personal history of nicotine dependence: Secondary | ICD-10-CM

## 2019-10-12 DIAGNOSIS — R509 Fever, unspecified: Secondary | ICD-10-CM | POA: Diagnosis not present

## 2019-10-12 DIAGNOSIS — F329 Major depressive disorder, single episode, unspecified: Secondary | ICD-10-CM | POA: Diagnosis present

## 2019-10-12 DIAGNOSIS — Z87442 Personal history of urinary calculi: Secondary | ICD-10-CM

## 2019-10-12 DIAGNOSIS — Z8551 Personal history of malignant neoplasm of bladder: Secondary | ICD-10-CM

## 2019-10-12 LAB — COMPREHENSIVE METABOLIC PANEL
ALT: 18 U/L (ref 0–44)
AST: 29 U/L (ref 15–41)
Albumin: 2.9 g/dL — ABNORMAL LOW (ref 3.5–5.0)
Alkaline Phosphatase: 64 U/L (ref 38–126)
Anion gap: 8 (ref 5–15)
BUN: 29 mg/dL — ABNORMAL HIGH (ref 8–23)
CO2: 24 mmol/L (ref 22–32)
Calcium: 10.3 mg/dL (ref 8.9–10.3)
Chloride: 108 mmol/L (ref 98–111)
Creatinine, Ser: 0.9 mg/dL (ref 0.44–1.00)
GFR calc Af Amer: >60 mL/min (ref >60)
GFR calc non Af Amer: >60 mL/min (ref >60)
Glucose, Bld: 117 mg/dL — ABNORMAL HIGH (ref 70–99)
Potassium: 4 mmol/L (ref 3.5–5.1)
Sodium: 140 mmol/L (ref 135–145)
Total Bilirubin: 2.9 mg/dL — ABNORMAL HIGH (ref 0.3–1.2)
Total Protein: 5.5 g/dL — ABNORMAL LOW (ref 6.5–8.1)

## 2019-10-12 LAB — CBC WITH DIFFERENTIAL/PLATELET
Abs Immature Granulocytes: 0.02 10*3/uL (ref 0.00–0.07)
Basophils Absolute: 0 10*3/uL (ref 0.0–0.1)
Basophils Relative: 0 %
Eosinophils Absolute: 0 10*3/uL (ref 0.0–0.5)
Eosinophils Relative: 0 %
HCT: 31.5 % — ABNORMAL LOW (ref 36.0–46.0)
Hemoglobin: 10.2 g/dL — ABNORMAL LOW (ref 12.0–15.0)
Immature Granulocytes: 1 %
Lymphocytes Relative: 5 %
Lymphs Abs: 0.1 10*3/uL — ABNORMAL LOW (ref 0.7–4.0)
MCH: 31.4 pg (ref 26.0–34.0)
MCHC: 32.4 g/dL (ref 30.0–36.0)
MCV: 96.9 fL (ref 80.0–100.0)
Monocytes Absolute: 0.1 10*3/uL (ref 0.1–1.0)
Monocytes Relative: 6 %
Neutro Abs: 2.2 10*3/uL (ref 1.7–7.7)
Neutrophils Relative %: 88 %
Platelets: 26 10*3/uL — CL (ref 150–400)
RBC: 3.25 MIL/uL — ABNORMAL LOW (ref 3.87–5.11)
RDW: 17.3 % — ABNORMAL HIGH (ref 11.5–15.5)
WBC: 2.5 10*3/uL — ABNORMAL LOW (ref 4.0–10.5)
nRBC: 0 % (ref 0.0–0.2)

## 2019-10-12 LAB — URINALYSIS, ROUTINE W REFLEX MICROSCOPIC
Bilirubin Urine: NEGATIVE
Glucose, UA: NEGATIVE mg/dL
Hgb urine dipstick: NEGATIVE
Ketones, ur: NEGATIVE mg/dL
Nitrite: NEGATIVE
Protein, ur: 100 mg/dL — AB
Specific Gravity, Urine: 1.015 (ref 1.005–1.030)
pH: 7 (ref 5.0–8.0)

## 2019-10-12 LAB — LACTIC ACID, PLASMA
Lactic Acid, Venous: 4 mmol/L (ref 0.5–1.9)
Lactic Acid, Venous: 2.3 mmol/L (ref 0.5–1.9)
Lactic Acid, Venous: 1.8 mmol/L (ref 0.5–1.9)
Lactic Acid, Venous: 2.4 mmol/L (ref 0.5–1.9)

## 2019-10-12 LAB — PROTIME-INR
INR: 1.8 — ABNORMAL HIGH (ref 0.8–1.2)
Prothrombin Time: 21.2 seconds — ABNORMAL HIGH (ref 11.4–15.2)

## 2019-10-12 LAB — PROCALCITONIN: Procalcitonin: 17.47 ng/mL

## 2019-10-12 LAB — RESPIRATORY PANEL BY RT PCR (FLU A&B, COVID)
Influenza A by PCR: NEGATIVE
Influenza B by PCR: NEGATIVE
SARS Coronavirus 2 by RT PCR: NEGATIVE

## 2019-10-12 LAB — TSH: TSH: 0.482 u[IU]/mL (ref 0.350–4.500)

## 2019-10-12 MED ORDER — SODIUM CHLORIDE 0.9 % IV SOLN: INTRAVENOUS | Status: AC

## 2019-10-12 MED ORDER — CITALOPRAM HYDROBROMIDE 20 MG PO TABS
40.0000 mg | ORAL_TABLET | Freq: Every day | ORAL | Status: DC
  Administered 2019-10-12: 40 mg via ORAL
  Filled 2019-10-12: qty 2

## 2019-10-12 MED ORDER — SODIUM CHLORIDE 0.9 % IV BOLUS
250.0000 mL | Freq: Once | INTRAVENOUS | Status: AC
  Administered 2019-10-13: 250 mL via INTRAVENOUS

## 2019-10-12 MED ORDER — ACETAMINOPHEN 650 MG RE SUPP: 650.0000 mg | Freq: Four times a day (QID) | RECTAL | Status: DC | PRN

## 2019-10-12 MED ORDER — SODIUM CHLORIDE 0.9 % IV SOLN
500.0000 mg | INTRAVENOUS | Status: AC
  Administered 2019-10-13 – 2019-10-16 (×4): 500 mg via INTRAVENOUS
  Filled 2019-10-12 (×4): qty 500

## 2019-10-12 MED ORDER — SODIUM CHLORIDE 0.9 % IV SOLN
1.0000 g | Freq: Once | INTRAVENOUS | Status: AC
  Administered 2019-10-12: 1 g via INTRAVENOUS
  Filled 2019-10-12: qty 10

## 2019-10-12 MED ORDER — ONDANSETRON HCL 4 MG PO TABS: 4.0000 mg | ORAL_TABLET | Freq: Four times a day (QID) | ORAL | Status: DC | PRN

## 2019-10-12 MED ORDER — ONDANSETRON HCL 4 MG/2ML IJ SOLN: 4.0000 mg | Freq: Four times a day (QID) | INTRAMUSCULAR | Status: DC | PRN

## 2019-10-12 MED ORDER — SODIUM CHLORIDE 0.9 % IV BOLUS
2000.0000 mL | Freq: Once | INTRAVENOUS | Status: AC
  Administered 2019-10-12: 2000 mL via INTRAVENOUS

## 2019-10-12 MED ORDER — PANTOPRAZOLE SODIUM 40 MG PO TBEC
40.0000 mg | DELAYED_RELEASE_TABLET | Freq: Every day | ORAL | Status: DC
  Administered 2019-10-12: 40 mg via ORAL
  Filled 2019-10-12: qty 1

## 2019-10-12 MED ORDER — SODIUM CHLORIDE 0.9 % IV SOLN
500.0000 mg | Freq: Once | INTRAVENOUS | Status: AC
  Administered 2019-10-12: 500 mg via INTRAVENOUS
  Filled 2019-10-12: qty 500

## 2019-10-12 MED ORDER — ACETAMINOPHEN 325 MG PO TABS
650.0000 mg | ORAL_TABLET | Freq: Four times a day (QID) | ORAL | Status: DC | PRN
  Administered 2019-10-14 – 2019-10-15 (×3): 650 mg via ORAL
  Filled 2019-10-12 (×3): qty 2

## 2019-10-12 MED ORDER — SODIUM CHLORIDE 0.9 % IV SOLN
2.0000 g | INTRAVENOUS | Status: AC
  Administered 2019-10-13 – 2019-10-16 (×4): 2 g via INTRAVENOUS
  Filled 2019-10-12 (×3): qty 20
  Filled 2019-10-12: qty 2

## 2019-10-12 MED ORDER — SENNOSIDES-DOCUSATE SODIUM 8.6-50 MG PO TABS: 1.0000 | ORAL_TABLET | Freq: Every evening | ORAL | Status: DC | PRN

## 2019-10-12 NOTE — ED Notes (Signed)
ED TO INPATIENT HANDOFF REPORT  ED Nurse Name and Phone #:   S Name/Age/Gender [name] 79 y.o. female Room/Bed: WA25/WA25  Code Status   Code Status: DNR  Home/SNF/Other Home    Triage Complete: Triage complete  Chief Complaint Acute respiratory failure with hypoxia ([place]) [J96.01]  Triage Note Per EMS pt husband verbalizes pt not acting like self "usually expresses her needs; but altered and not walking at all; normally uses a wheely walker." Pt presents with fever and altered, "swarming."   EMS VS 102/61 BP 66 93 oxygen RA 185 CBG 106.2 temporal given 800 motrin with EMS  500 ml bolus of NaCl with EMS  Hx of bladder/colon cancer; concern for UTI.   Attempt IV to right hand unsuccessful x2.  This writer was able to obtain labs ordered and one set of blood cultures from straight stick to right forearm.    Allergies Allergies  Allergen Reactions  . Bee Venom Anaphylaxis  . Codeine Anaphylaxis and Rash  . Morphine And Related Anaphylaxis and Rash  . Phytonadione Other (See Comments)    Pt experienced an episode of cyanosis following dose of Vitamin K 10 mg IV.  Marland Kitchen Penicillins Rash and Other (See Comments)    Has patient had a PCN reaction causing immediate rash, facial/tongue/throat swelling, SOB or lightheadedness with hypotension: No Has patient had a PCN reaction causing severe rash involving mucus membranes or skin necrosis: No Has patient had a PCN reaction that required hospitalization: No Has patient had a PCN reaction occurring within the last 10 years: No If all of the above answers are "NO", then may proceed with Cephalosporin use.  . Promethazine Hcl Other (See Comments)    Reaction:  Unknown     Level of Care/Admitting Diagnosis ED Disposition    ED Disposition Condition Comment   Admit  Hospital Area: [facility] [[id number]]  Level of Care: Med-Surg [16]  Covid Evaluation: Confirmed COVID Negative  Date Laboratory  Confirmed COVID Negative: 10/12/2019  Diagnosis: Acute respiratory failure with hypoxia [facility]) [[id number]]  Admitting Physician: [place] ERIC J [[id number]]  Attending Physician: [place] ERIC J [[id number]]  Estimated length of stay: past midnight tomorrow  Certification:: I certify this patient will need inpatient services for at least 2 midnights       B Medical/Surgery History Past Medical History:  Diagnosis Date  . Anemia, iron deficiency   . Arthritis   . Bladder cancer [facility]) 2003   Bladder removed - [name]  . Cataract immature   . Cirrhosis ([place]) 08/26/2011  . Colon cancer [facility]) 2001   Hemicolectomy - [name]  . Depression   . GERD (gastroesophageal reflux disease)   . Headache(784.0)   . Hemorrhoids   . History of colon polyps   . History of DVT (deep vein thrombosis) 50+yrs ago   With pregnancy  . History of GI bleed   . History of kidney stones   . History of urostomy    Urostomy d/t hx bladder cancer  . Hx of transfusion of whole blood   . Hypertension   . Joint pain   . Memory loss    Chemotherapy; short and long term  . Osteoporosis   . Thrombocytopenia ([place])    Past Surgical History:  Procedure Laterality Date  . ABDOMINAL HYSTERECTOMY    . APPENDECTOMY    . BLADDER SURGERY     bladder removed d/t cancer  . CHOLECYSTECTOMY    . COLON RESECTION  with colostomy then colostomy reversal  . COLON SURGERY  2004  . COLON SURGERY  2001  . COLONOSCOPY    . ESOPHAGOGASTRODUODENOSCOPY  02/27/2012   Procedure: ESOPHAGOGASTRODUODENOSCOPY (EGD);  Surgeon: [name], MD;  Location: [name];  Service: Endoscopy;  Laterality: N/A;  bedside case  . NEPHROLITHOTOMY Left 05/04/2014   Procedure: LEFT PERCUTANEOUS NEPHROLITHOTOMY ;  Surgeon: [name], MD;  Location: [facility];  Service: Urology;  Laterality: Left;  . TOTAL KNEE ARTHROPLASTY  2010   Left knee     A IV Location/Drains/Wounds Patient Lines/Drains/Airways Status   Active Line/Drains/Airways     Name:   Placement date:   Placement time:   Site:   Days:   Peripheral IV 10/12/19 Left Hand   10/12/19    1014    Hand   less than 1   Nephrostomy Left 24 Fr.   05/04/14    1235    Left   1987   Urostomy RUQ   -    -    RUQ      Urostomy RUQ   -    -    RUQ      Incision (Closed) 05/04/14 Back Left   05/04/14    1242     1987          Intake/Output Last 24 hours No intake or output data in the 24 hours ending 10/12/19 1942  Labs/Imaging Results for orders placed or performed during the hospital encounter of 10/12/19 (from the past 48 hour(s))  Comprehensive metabolic panel     Status: Abnormal   Collection Time: 10/12/19 10:12 AM  Result Value Ref Range   Sodium 140 135 - 145 mmol/L   Potassium 4.0 3.5 - 5.1 mmol/L   Chloride 108 98 - 111 mmol/L   CO2 24 22 - 32 mmol/L   Glucose, Bld 117 (H) 70 - 99 mg/dL   BUN 29 (H) 8 - 23 mg/dL   Creatinine, Ser 0.90 0.44 - 1.00 mg/dL   Calcium 10.3 8.9 - 10.3 mg/dL   Total Protein 5.5 (L) 6.5 - 8.1 g/dL   Albumin 2.9 (L) 3.5 - 5.0 g/dL   AST 29 15 - 41 U/L   ALT 18 0 - 44 U/L   Alkaline Phosphatase 64 38 - 126 U/L   Total Bilirubin 2.9 (H) 0.3 - 1.2 mg/dL   GFR calc non Af Amer >60 >60 mL/min   GFR calc Af Amer >60 >60 mL/min   Anion gap 8 5 - 15    Comment: Performed at [facility], [place] [address] [place], [place] [address]  Lactic acid, plasma     Status: Abnormal   Collection Time: 10/12/19 10:12 AM  Result Value Ref Range   Lactic Acid, Venous 4.0 (HH) 0.5 - 1.9 mmol/L    Comment: CRITICAL RESULT CALLED TO, READ BACK BY AND VERIFIED WITH: Haden Suder,R. RN @1119  10/12/19 BILLINGSLEY,L Performed at [facility], [place] [address] [place], [place] [address]   CBC with Differential     Status: Abnormal   Collection Time: 10/12/19 10:12 AM  Result Value Ref Range   WBC 2.5 (L) 4.0 - 10.5 K/uL   RBC 3.25 (L) 3.87 - 5.11 MIL/uL   Hemoglobin 10.2 (L) 12.0 - 15.0 g/dL   HCT 31.5 (L) 36.0 - 46.0 %    MCV 96.9 80.0 - 100.0 fL   MCH 31.4 26.0 - 34.0 pg   MCHC 32.4 30.0 -  36.0 g/dL   RDW 17.3 (H) 11.5 - 15.5 %   Platelets 26 (LL) 150 - 400 K/uL    Comment: REPEATED TO VERIFY PLATELET COUNT CONFIRMED BY SMEAR SPECIMEN CHECKED FOR CLOTS Immature Platelet Fraction may be clinically indicated, consider ordering this additional test [id number]    nRBC 0.0 0.0 - 0.2 %   Neutrophils Relative % 88 %   Neutro Abs 2.2 1.7 - 7.7 K/uL   Lymphocytes Relative 5 %   Lymphs Abs 0.1 (L) 0.7 - 4.0 K/uL   Monocytes Relative 6 %   Monocytes Absolute 0.1 0.1 - 1.0 K/uL   Eosinophils Relative 0 %   Eosinophils Absolute 0.0 0.0 - 0.5 K/uL   Basophils Relative 0 %   Basophils Absolute 0.0 0.0 - 0.1 K/uL   Immature Granulocytes 1 %   Abs Immature Granulocytes 0.02 0.00 - 0.07 K/uL    Comment: Performed at [facility], [place] [address] [place], [place] [address]  Protime-INR     Status: Abnormal   Collection Time: 10/12/19 10:12 AM  Result Value Ref Range   Prothrombin Time 21.2 (H) 11.4 - 15.2 seconds   INR 1.8 (H) 0.8 - 1.2    Comment: (NOTE) INR goal varies based on device and disease states. Performed at [facility], [place] [address], [place], [place] [address]   Respiratory Panel by RT PCR (Flu A&B, Covid) - Nasopharyngeal Swab     Status: None   Collection Time: 10/12/19 10:12 AM   Specimen: Nasopharyngeal Swab  Result Value Ref Range   SARS Coronavirus 2 by RT PCR NEGATIVE NEGATIVE    Comment: (NOTE) SARS-CoV-2 target nucleic acids are NOT DETECTED. The SARS-CoV-2 RNA is generally detectable in upper respiratoy specimens during the acute phase of infection. The lowest concentration of SARS-CoV-2 viral copies this assay can detect is 131 copies/mL. A negative result does not preclude SARS-Cov-2 infection and should not be used as the sole basis for treatment or other patient management decisions. A negative result may occur with  improper specimen  collection/handling, submission of specimen other than nasopharyngeal swab, presence of viral mutation(s) within the areas targeted by this assay, and inadequate number of viral copies (<131 copies/mL). A negative result must be combined with clinical observations, patient history, and epidemiological information. The expected result is Negative. Fact Sheet for Patients:  PinkCheek.be Fact Sheet for Healthcare Providers:  GravelBags.it This test is not yet ap proved or cleared by the [place] FDA and  has been authorized for detection and/or diagnosis of SARS-CoV-2 by FDA under an Emergency Use Authorization (EUA). This EUA will remain  in effect (meaning this test can be used) for the duration of the COVID-19 declaration under Section 564(b)(1) of the Act, 21 U.S.C. section 360bbb-3(b)(1), unless the authorization is terminated or revoked sooner.    Influenza A by PCR NEGATIVE NEGATIVE   Influenza B by PCR NEGATIVE NEGATIVE    Comment: (NOTE) The Xpert Xpress SARS-CoV-2/FLU/RSV assay is intended as an aid in  the diagnosis of influenza from Nasopharyngeal swab specimens and  should not be used as a sole basis for treatment. Nasal washings and  aspirates are unacceptable for Xpert Xpress SARS-CoV-2/FLU/RSV  testing. Fact Sheet for Patients: PinkCheek.be Fact Sheet for Healthcare Providers: GravelBags.it This test is not yet approved or cleared by the [place] FDA and  has been authorized for detection and/or diagnosis of SARS-CoV-2 by  FDA under an Emergency Use Authorization (EUA). This EUA will remain  in effect (meaning this test can be used) for the duration of the  Covid-19 declaration under Section 564(b)(1) of the Act, 21  U.S.C. section 360bbb-3(b)(1), unless the authorization is  terminated or revoked. Performed at [facility],  [place] [address], [place], [place] [address]   Lactic acid, plasma     Status: Abnormal   Collection Time: 10/12/19 12:12 PM  Result Value Ref Range   Lactic Acid, Venous 2.3 (HH) 0.5 - 1.9 mmol/L    Comment: CRITICAL VALUE NOTED.  VALUE IS CONSISTENT WITH PREVIOUSLY REPORTED AND CALLED VALUE. Performed at [facility], [place] [address] [place], [place] [address]   Urinalysis, Routine w reflex microscopic     Status: Abnormal   Collection Time: 10/12/19 12:18 PM  Result Value Ref Range   Color, Urine AMBER (A) YELLOW    Comment: BIOCHEMICALS MAY BE AFFECTED BY COLOR   APPearance CLOUDY (A) CLEAR   Specific Gravity, Urine 1.015 1.005 - 1.030   pH 7.0 5.0 - 8.0   Glucose, UA NEGATIVE NEGATIVE mg/dL   Hgb urine dipstick NEGATIVE NEGATIVE   Bilirubin Urine NEGATIVE NEGATIVE   Ketones, ur NEGATIVE NEGATIVE mg/dL   Protein, ur 100 (A) NEGATIVE mg/dL   Nitrite NEGATIVE NEGATIVE   Leukocytes,Ua TRACE (A) NEGATIVE   RBC / HPF 11-20 0 - 5 RBC/hpf   WBC, UA 11-20 0 - 5 WBC/hpf   Bacteria, UA RARE (A) NONE SEEN   Squamous Epithelial / LPF 0-5 0 - 5   Mucus PRESENT    Hyaline Casts, UA PRESENT    Triple Phosphate Crystal PRESENT     Comment: Performed at [facility], [place] [address], [place], [place] [address]   DG Chest Port 1 View  Result Date: 10/12/2019 CLINICAL DATA:  Sepsis EXAM: PORTABLE CHEST 1 VIEW COMPARISON:  12/16/2018 FINDINGS: Cardiac enlargement without heart failure.  Left lung clear. Wispy density in the right lung base appears to have progressed in the interval. No effusion. IMPRESSION: Wispy density in the right lung base may represent atelectasis, scarring or possibly pneumonia. Follow-up recommended. Electronically Signed   By: Franchot Gallo M.D.   On: 10/12/2019 10:47    Pending Labs Unresulted Labs (From admission, onward)    Start     Ordered   10/13/19 0092  Basic metabolic panel  Tomorrow morning,   R     10/12/19 1811    10/13/19 0500  CBC  Tomorrow morning,   R     10/12/19 1811   10/13/19 0500  Procalcitonin  Daily,   R     10/12/19 1811   10/12/19 1914  Urine culture  Add-on,   AD     10/12/19 1913   10/12/19 1811  HIV Antibody (routine testing w rflx)  (HIV Antibody (Routine testing w reflex) panel)  Once,   STAT     10/12/19 1811   10/12/19 1811  TSH  Once,   STAT     10/12/19 1811   10/12/19 1811  Lactic acid, plasma  STAT Now then every 3 hours,   R (with STAT occurrences)     10/12/19 1811   10/12/19 1811  Procalcitonin - Baseline  ONCE - STAT,   STAT     10/12/19 1811   10/12/19 0949  Culture, blood (Routine x 2)  BLOOD CULTURE X 2,   STAT     10/12/19 0948          Vitals/Pain Today's Vitals  10/12/19 1612 10/12/19 1630 10/12/19 1730 10/12/19 1830  BP: (!) 125/110 (!) 142/123 (!) 121/106 (!) 170/128  Pulse: (!) 57 (!) 56 (!) 58 (!) 53  Resp: 20 18 18 18   Temp:      TempSrc:      SpO2: 94% 97% 96% 95%  Weight:      Height:        Isolation Precautions No active isolations  Medications Medications  acetaminophen (TYLENOL) tablet 650 mg (has no administration in time range)    Or  acetaminophen (TYLENOL) suppository 650 mg (has no administration in time range)  senna-docusate (Senokot-S) tablet 1 tablet (has no administration in time range)  ondansetron (ZOFRAN) tablet 4 mg (has no administration in time range)    Or  ondansetron (ZOFRAN) injection 4 mg (has no administration in time range)  cefTRIAXone (ROCEPHIN) 2 g in sodium chloride 0.9 % 100 mL IVPB (has no administration in time range)  azithromycin (ZITHROMAX) 500 mg in sodium chloride 0.9 % 250 mL IVPB (has no administration in time range)  0.9 %  sodium chloride infusion (has no administration in time range)  citalopram (CELEXA) tablet 40 mg (has no administration in time range)  pantoprazole (PROTONIX) EC tablet 40 mg (has no administration in time range)  sodium chloride 0.9 % bolus 2,000 mL (0 mLs Intravenous  Stopped 10/12/19 1407)  cefTRIAXone (ROCEPHIN) 1 g in sodium chloride 0.9 % 100 mL IVPB (0 g Intravenous Stopped 10/12/19 1121)  azithromycin (ZITHROMAX) 500 mg in sodium chloride 0.9 % 250 mL IVPB (0 mg Intravenous Stopped 10/12/19 1407)    Mobility non-ambulatory High fall risk   Focused Assessments    R Recommendations: See Admitting Provider Note  Report given to:   Additional Notes:

## 2019-10-12 NOTE — Consult Note (Addendum)
[place] Nurse ostomy consult note Consult requested for ostomy supply assistance.  Pt is unable to communicate but husband was at the bedside and is very well-informed regarding her urostomy.  He performs all pouching activities prior to admission and is independent with pouch application and emptying.  Urostomy has been in place for 10 years.  He states that he will bring in her precut pouching supplies and paste from the car for use while in the hospital, "because those ones that you have to cut out don't stay on."  He requests that I do not remove the pouch today since it is intact and not leaking. Mod amt dark yellow urine.   Stoma type/location: Urostomy pouch is intact with good seal to right abd.  Husband states he changes the pouch every Mon and Fri. It appears to be a one piece convex pouch.  Stoma is red and viable when visualized through the pouch. Connected to bedside drainage bag.  Treatment options for stomal/peristomal skin: Orders provided for bedside nurses as follows: Empty pouch when 1/3  to  full of urine Connect urinary pouch to bedside drain bag Change pouch twice weekly: Every Mon and Fri per family request. Write date of pouch application on pouch. Have spare pouch at bedside at all times; family will bring in their own supplies and may change it if desired.  Husband denies further questions or need for assistance. Please re-consult if further assistance is needed.  Thank-you,  Julien Girt MSN, [place], [place], [place], [place]

## 2019-10-12 NOTE — H&P (Signed)
History and Physical    [name] OVF:[id number] DOB: 07/22/1941 DOA: 10/12/2019  PCP: [name], MD  Patient coming from: Home  I have personally briefly reviewed patient's old medical records in [place]  Chief Complaint: Confusion  HPI: [name] is a 79 y.o. female with medical history significant of advanced dementia, depression, essential hypertension, GERD, and bladder cancer status post ostomy who presented to the ED via EMS for worsening confusion.  Patient with history advanced dementia and husband at bedside gives majority of the HPI.  Apparently, yesterday the patient had been more confused, with decreased oral intake and progressive weakness.  Her husband also reports that she even stopped speaking over the past 24 hours and had an episode of vomiting last night.  Husband reports that her dementia has slowly advanced over the last 2 years, but at baseline she is able to ambulate with the use of a walker.  Currently lives at home with husband with occasional outside support.  After initial treatment in ED, patient now more verbal and close to her normal baseline per husband's report.  ED Course: Temperature 100.8, HR 63, RR 20, BP 99/88, SPO2 95% on 2 L nasal cannula.  WBC count 2.5, hemoglobin 10.2, platelet count 26.  Sodium 140, potassium 4.0, chloride 108, CO2 24, BUN 29, creatinine 0.90, glucose 117.  Lactic acid 4.0.  Covid-19/SARS-CoV-2 PCR negative.  Rapid influenza negative.  INR 1.8.  Chest x-ray with right lower base density.  Patient was given a 2 L NS bolus with azithromycin and ceftriaxone.  ED physician referred for admission for acute hypoxic respiratory failure/sepsis likely secondary to community-acquired pneumonia.  Review of Systems: As per HPI otherwise 10 point review of systems negative.    Past Medical History:  Diagnosis Date  . Anemia, iron deficiency   . Arthritis   . Bladder cancer [facility]) 2003   Bladder removed - [name]   . Cataract immature   . Cirrhosis ([place]) 08/26/2011  . Colon cancer [facility]) 2001   Hemicolectomy - [name]  . Depression   . GERD (gastroesophageal reflux disease)   . Headache(784.0)   . Hemorrhoids   . History of colon polyps   . History of DVT (deep vein thrombosis) 50+yrs ago   With pregnancy  . History of GI bleed   . History of kidney stones   . History of urostomy    Urostomy d/t hx bladder cancer  . Hx of transfusion of whole blood   . Hypertension   . Joint pain   . Memory loss    Chemotherapy; short and long term  . Osteoporosis   . Thrombocytopenia ([place])     Past Surgical History:  Procedure Laterality Date  . ABDOMINAL HYSTERECTOMY    . APPENDECTOMY    . BLADDER SURGERY     bladder removed d/t cancer  . CHOLECYSTECTOMY    . COLON RESECTION     with colostomy then colostomy reversal  . COLON SURGERY  2004  . COLON SURGERY  2001  . COLONOSCOPY    . ESOPHAGOGASTRODUODENOSCOPY  02/27/2012   Procedure: ESOPHAGOGASTRODUODENOSCOPY (EGD);  Surgeon: [name], MD;  Location: [name];  Service: Endoscopy;  Laterality: N/A;  bedside case  . NEPHROLITHOTOMY Left 05/04/2014   Procedure: LEFT PERCUTANEOUS NEPHROLITHOTOMY ;  Surgeon: [name], MD;  Location: [facility];  Service: Urology;  Laterality: Left;  . TOTAL KNEE ARTHROPLASTY  2010   Left knee  reports that she quit smoking about 28 years ago. Her smoking use included cigarettes. She started smoking about 64 years ago. She has a 17.50 pack-year smoking history. She has never used smokeless tobacco. She reports that she does not drink alcohol or use drugs.  Allergies  Allergen Reactions  . Bee Venom Anaphylaxis  . Codeine Anaphylaxis and Rash  . Morphine And Related Anaphylaxis and Rash  . Phytonadione Other (See Comments)    Pt experienced an episode of cyanosis following dose of Vitamin K 10 mg IV.  Marland Kitchen Penicillins Rash and Other (See Comments)    Has patient had a PCN reaction causing immediate  rash, facial/tongue/throat swelling, SOB or lightheadedness with hypotension: No Has patient had a PCN reaction causing severe rash involving mucus membranes or skin necrosis: No Has patient had a PCN reaction that required hospitalization: No Has patient had a PCN reaction occurring within the last 10 years: No If all of the above answers are "NO", then may proceed with Cephalosporin use.  . Promethazine Hcl Other (See Comments)    Reaction:  Unknown     Family History  Problem Relation Age of Onset  . Diabetes Brother   . Cancer Other   . Hypertension Other     Family history reviewed and not pertinent   Prior to Admission medications   Medication Sig Start Date End Date Taking? Authorizing Provider  acidophilus (RISAQUAD) CAPS capsule Take 1 capsule by mouth daily.    [provider]  Apoaequorin (PREVAGEN EXTRA STRENGTH) 20 MG CAPS Take 20 mg by mouth daily.    [provider]  Ascorbic Acid (VITAMIN C) 1000 MG tablet Take 1,000 mg by mouth daily.    [provider]  cholecalciferol (VITAMIN D) 1000 units tablet Take 1,000 Units by mouth daily.    [provider]  Cinnamon 500 MG capsule Take 500 mg by mouth daily.     [provider]  citalopram (CELEXA) 40 MG tablet TAKE 1 TABLET BY MOUTH EVERY DAY 05/17/19   [name], MD  fexofenadine (ALLEGRA) 180 MG tablet Take 180 mg by mouth daily.    [provider]  GENERLAC 10 GM/15ML SOLN TAKE 30 MLS BY MOUTH THREE TIMES DAILY 08/10/17   [name], MD  Ginkgo Biloba 60 MG TABS Take 60 mg by mouth daily.     [provider]  lactulose (CHRONULAC) 10 GM/15ML solution TAKE 30 MLS BY MOUTH THREE TIMES DAILY 12/22/18   [name], MD  losartan (COZAAR) 25 MG tablet Take 25 mg by mouth daily.    [provider]  losartan (COZAAR) 25 MG tablet TAKE 1 TABLET(25 MG) BY MOUTH DAILY 09/05/19   [name], MD  magic mouthwash w/lidocaine SOLN Take 5  mLs by mouth 3 (three) times daily as needed for mouth pain. 08/15/19   [place], Holli Humbles, NP  Multiple Vitamin (MULTIVITAMIN WITH MINERALS) TABS tablet Take 1 tablet by mouth daily.    [provider]  omega-3 acid ethyl esters (LOVAZA) 1 g capsule Take 1 g by mouth daily.    [provider]  pantoprazole (PROTONIX) 40 MG tablet TAKE 1 TABLET(40 MG) BY MOUTH DAILY 03/11/19   [name], MD  spironolactone (ALDACTONE) 50 MG tablet TAKE 1 TABLET(50 MG) BY MOUTH DAILY 08/19/19   [name], MD  vitamin B-12 (CYANOCOBALAMIN) 1000 MCG tablet Take 1,000 mcg by mouth daily.    [provider]  vitamin E  400 UNIT capsule Take 400 Units by mouth daily.    [provider]    Physical Exam: Vitals:   10/12/19 1030 10/12/19 1100 10/12/19 1130 10/12/19 1215  BP: (!) 148/122 (!) 112/41 99/88 106/70  Pulse: 69 60 63 66  Resp: (!) 24 20 20 18   Temp:      TempSrc:      SpO2: 93% 93% 95% 96%  Weight:      Height:        Constitutional: NAD, calm, comfortable, pleasantly confused Eyes: PERRL, lids and conjunctivae normal ENMT: Mucous membranes are dry. Posterior pharynx clear of any exudate or lesions.Normal dentition.  Neck: normal, supple, no masses, no thyromegaly Respiratory: Breath sounds slightly decreased bilateral bases, no wheezing/crackles, normal respiratory effort, on 3 L nasal cannula oxygen 95%. Cardiovascular: Regular rate and rhythm, no murmurs / rubs / gallops. No extremity edema. 2+ pedal pulses. No carotid bruits.  Abdomen: no tenderness, no masses palpated. No hepatosplenomegaly. Bowel sounds positive.  Urostomy noted with dark-tinged urine. Musculoskeletal: no clubbing / cyanosis. No joint deformity upper and lower extremities. Good ROM, no contractures. Normal muscle tone.  Skin: no rashes, lesions, ulcers. No induration Neurologic: CN 2-12 grossly intact. Sensation intact, DTR normal. Strength 5/5 in all 4.  Psychiatric: Poor  judgment and insight. Alert, not oriented to person/place/time/situation. Normal mood.    Labs on Admission: I have personally reviewed following labs and imaging studies  CBC: Recent Labs  Lab 10/12/19 1012  WBC 2.5*  NEUTROABS 2.2  HGB 10.2*  HCT 31.5*  MCV 96.9  PLT 26*   Basic Metabolic Panel: Recent Labs  Lab 10/12/19 1012  NA 140  K 4.0  CL 108  CO2 24  GLUCOSE 117*  BUN 29*  CREATININE 0.90  CALCIUM 10.3   GFR: Estimated Creatinine Clearance: 47.6 mL/min (by C-G formula based on SCr of 0.9 mg/dL). Liver Function Tests: Recent Labs  Lab 10/12/19 1012  AST 29  ALT 18  ALKPHOS 64  BILITOT 2.9*  PROT 5.5*  ALBUMIN 2.9*   No results for input(s): LIPASE, AMYLASE in the last 168 hours. No results for input(s): AMMONIA in the last 168 hours. Coagulation Profile: Recent Labs  Lab 10/12/19 1012  INR 1.8*   Cardiac Enzymes: No results for input(s): CKTOTAL, CKMB, CKMBINDEX, TROPONINI in the last 168 hours. BNP (last 3 results) No results for input(s): PROBNP in the last 8760 hours. HbA1C: No results for input(s): HGBA1C in the last 72 hours. CBG: No results for input(s): GLUCAP in the last 168 hours. Lipid Profile: No results for input(s): CHOL, HDL, LDLCALC, TRIG, CHOLHDL, LDLDIRECT in the last 72 hours. Thyroid Function Tests: No results for input(s): TSH, T4TOTAL, FREET4, T3FREE, THYROIDAB in the last 72 hours. Anemia Panel: No results for input(s): VITAMINB12, FOLATE, FERRITIN, TIBC, IRON, RETICCTPCT in the last 72 hours. Urine analysis:    Component Value Date/Time   COLORURINE AMBER (A) 10/12/2019 1218   APPEARANCEUR CLOUDY (A) 10/12/2019 1218   LABSPEC 1.015 10/12/2019 1218   PHURINE 7.0 10/12/2019 [place] 10/12/2019 [place] 10/12/2019 [place] 10/12/2019 [place] 10/12/2019 1218   PROTEINUR 100 (A) 10/12/2019 1218   UROBILINOGEN 1.0 04/06/2014 1856   NITRITE NEGATIVE  10/12/2019 1218   LEUKOCYTESUR TRACE (A) 10/12/2019 1218    Radiological Exams on Admission: DG Chest Port 1 View  Result Date: 10/12/2019 CLINICAL DATA:  Sepsis EXAM: PORTABLE CHEST 1 VIEW COMPARISON:  12/16/2018 FINDINGS: Cardiac enlargement without heart failure.  Left lung clear. Wispy density in the right lung base appears to have progressed in the interval. No effusion. IMPRESSION: Wispy density in the right lung base may represent atelectasis, scarring or possibly pneumonia. Follow-up recommended. Electronically Signed   By: Franchot Gallo M.D.   On: 10/12/2019 10:47    EKG: Independently reviewed.   Assessment/Plan Principal Problem:   Acute respiratory failure with hypoxia (HCC) Active Problems:   Bladder cancer (HCC)   Thrombocytopenia (HCC)   Dementia without behavioral disturbance (HCC)   Sepsis ([place])   Essential hypertension   Community acquired pneumonia    Acute hypoxic respiratory failure Sepsis, present on admission Lactic acidosis Community-acquired pneumonia Patient presenting from home with increased confusion, decreased oral intake.  Was noted to be hypoxic with elevated lactic acid of 4.0.  Chest x-ray with right lung base density concerning for community-acquired pneumonia.  Covid-19/SARS-CoV-2 PCR negative.  Rapid influenza negative.  Urinalysis with trace leukoesterase, negative nitrite, rare bacteria and 11-WBCs.  Differential diagnosis at this time likely community-acquired versus aspiration pneumonia versus UTI.  Received azithromycin and ceftriaxone in ED.  Also status post 2 L NS bolus. --Admit inpatient, medical/surgical bed --Blood cultures x2: Pending --Urine culture: Pending --Check procalcitonin --Trend lactic acid --Speech therapy evaluation to assess for possible aspiration given advanced dementia --Continue supplemental oxygen, maintain SPO2 greater than 92% --Continue empiric antibiotics with azithromycin and ceftriaxone --Supportive care,  antipyretics  Hx essential hypertension Husband reports poor oral intake over the preceding days, especially yesterday.  Blood pressure low at 99/88 on admission.  Received 2 L NS bolus in ED.  On losartan and spironolactone outpatient. --Hold home antihypertensives. --Continue monitor blood pressure closely --Continue IVF with NS at 100 mL's per hour  Thrombocytopenia Platelet count 26 on admission.  Baseline over the past year between 46-72.  No signs of active bleeding. --hold any type of chemical DVT prophylaxis given thrombocytopenia --Monitor platelet count daily  Depression: Continue Celexa 40 mg p.o. daily  GERD: Continue PPI  Hx bladder cancer status post urostomy --Wound ostomy consult for continue management of urostomy while inpatient   DVT prophylaxis: SCDs, chemical DVT prophylaxis contraindicated in the setting of thrombocytopenia Code Status: DNR/DNI - confirmed with patient's husband Mariea Clonts at bedside Family Communication: Discussed plan of care with patient's husband Mariea Clonts at bedside Disposition Plan: Likely home, pending PT/OT evaluation Consults called: None Admission status: Inpatient   Severity of Illness: The appropriate patient status for this patient is INPATIENT. Inpatient status is judged to be reasonable and necessary in order to provide the required intensity of service to ensure the patient's safety. The patient's presenting symptoms, physical exam findings, and initial radiographic and laboratory data in the context of their chronic comorbidities is felt to place them at high risk for further clinical deterioration. Furthermore, it is not anticipated that the patient will be medically stable for discharge from the hospital within 2 midnights of admission. The following factors support the patient status of inpatient.   " The patient's presenting symptoms include worsening confusion from her normal baseline " The worrisome physical exam findings include  temperature 100.8, low BP, hypoxia " The initial radiographic and laboratory data are worrisome because of right lung base density concerning for pneumonia " The chronic co-morbidities include advanced dementia, hypertension, bladder cancer, depression.   * I certify that at the point of admission it is my clinical judgment that the patient will require inpatient hospital care spanning beyond 2  midnights from the point of admission due to high intensity of service, high risk for further deterioration and high frequency of surveillance required.*    Deetta Siegmann J [place] DO [facility] Available via Epic secure chat 7am-7pm After these hours, please refer to coverage provider listed on [email or web address] 10/12/2019, 12:51 PM

## 2019-10-12 NOTE — ED Provider Notes (Signed)
[facility] DEPT Provider Note   CSN: [id number] Arrival date & time: 10/12/19  0935     History Chief Complaint  Patient presents with  . Altered Mental Status    [name] is a 79 y.o. 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  Her husband reports the patient demonstrated an acute change in her behavior yesterday evening.  She has dementia at baseline and "can't hold a conversation or anything, she doesn't know where she is usually," but yesterday night and today became less verbal and much more restless.  Husband denies that she has been coughing or wheezing at home.  He believes her urostomy bag output looks cloudier and bloody, "like maybe an infection," and tells me she hasn't had a UTI in about 2 years.  EMS team reports patient had a forehead temp of 106F on their arrival.  They administered 500 cc IVF and 800 mg ibuprofen prior to transporting her to the ED.  The patient cannot provide any history due to dementia, level 5 caveat.  HPI     Past Medical History:  Diagnosis Date  . Anemia, iron deficiency   . Arthritis   . Bladder cancer [facility]) 2003   Bladder removed - [name]  . Cataract immature   . Cirrhosis ([place]) 08/26/2011  . Colon cancer [facility]) 2001   Hemicolectomy - [name]  . Depression   . GERD (gastroesophageal reflux disease)   . Headache(784.0)   . Hemorrhoids   . History of colon polyps   . History of DVT (deep vein thrombosis) 50+yrs ago   With pregnancy  . History of GI bleed   . History of kidney stones   . History of urostomy    Urostomy d/t hx bladder cancer  . Hx of transfusion of whole blood   . Hypertension   . Joint  pain   . Memory loss    Chemotherapy; short and long term  . Osteoporosis   . Thrombocytopenia [facility])     Patient Active Problem List   Diagnosis Date Noted  . Acute respiratory failure with hypoxia ([place]) 10/12/2019  . Acute lower UTI   . Pancytopenia ([place])   . Community acquired pneumonia   . Hypokalemia 11/02/2016  . Acute encephalopathy 11/02/2016  . Vasovagal syncope 11/02/2016  . Near syncope 11/02/2016  . Essential hypertension 09/11/2016  . Renal stones 05/04/2014  . Pyelonephritis, acute 04/06/2014  . Pyelonephritis 04/06/2014  . Nephrolithiasis 04/06/2014  . Presence of urostomy ([place]) 04/06/2014  . Sepsis ([place]) 02/28/2014  . Severe sepsis ([place]) 02/28/2014  . Hydronephrosis, left 02/28/2014  . E. coli pyelonephritis 02/28/2014  . Leukopenia 03/05/2012  . Varices, esophageal ([place]) 02/29/2012  . Acute upper GI bleeding 02/29/2012  . Gastric ulcer, acute with hemorrhage 02/29/2012  . Dementia without behavioral disturbance ([place]) 02/26/2012  . Cirrhosis, non-alcoholic ([place]) 28/41/3244  . Melena 02/26/2012  . Acute blood loss anemia 02/26/2012  . Diarrhea 02/26/2012  . Fracture of humerus 02/26/2012  . Ventral hernia without obstruction or gangrene 01/13/2012  . Colon cancer ([place]) 08/26/2011  . Bladder cancer ([place]) 08/26/2011  . Thrombocytopenia ([place]) 08/26/2011  . Anemia, iron deficiency 08/26/2011    Past Surgical History:  Procedure Laterality Date  . ABDOMINAL HYSTERECTOMY    .  APPENDECTOMY    . BLADDER SURGERY     bladder removed d/t cancer  . CHOLECYSTECTOMY    . COLON RESECTION     with colostomy then colostomy reversal  . COLON SURGERY  2004  . COLON SURGERY  2001  . COLONOSCOPY    . ESOPHAGOGASTRODUODENOSCOPY  02/27/2012   Procedure: ESOPHAGOGASTRODUODENOSCOPY (EGD);  Surgeon: [name], MD;  Location: [name];  Service: Endoscopy;  Laterality: N/A;  bedside case  . NEPHROLITHOTOMY Left 05/04/2014   Procedure: LEFT PERCUTANEOUS  NEPHROLITHOTOMY ;  Surgeon: [name], MD;  Location: [facility];  Service: Urology;  Laterality: Left;  . TOTAL KNEE ARTHROPLASTY  2010   Left knee     OB History   No obstetric history on file.     Family History  Problem Relation Age of Onset  . Diabetes Brother   . Cancer Other   . Hypertension Other     Social History   Tobacco Use  . Smoking status: Former Smoker    Packs/day: 0.50    Years: 35.00    Pack years: 17.50    Types: Cigarettes    Start date: 10/25/1955    Quit date: 09/22/1991    Years since quitting: 28.0  . Smokeless tobacco: Never Used  . Tobacco comment: quit 23years ago  Substance Use Topics  . Alcohol use: No    Alcohol/week: 0.0 standard drinks  . Drug use: No    Home Medications Prior to Admission medications   Medication Sig Start Date End Date Taking? Authorizing Provider  acidophilus (RISAQUAD) CAPS capsule Take 1 capsule by mouth daily.    [provider]  Apoaequorin (PREVAGEN EXTRA STRENGTH) 20 MG CAPS Take 20 mg by mouth daily.    [provider]  Ascorbic Acid (VITAMIN C) 1000 MG tablet Take 1,000 mg by mouth daily.    [provider]  cholecalciferol (VITAMIN D) 1000 units tablet Take 1,000 Units by mouth daily.    [provider]  Cinnamon 500 MG capsule Take 500 mg by mouth daily.     [provider]  citalopram (CELEXA) 40 MG tablet TAKE 1 TABLET BY MOUTH EVERY DAY 05/17/19   [name], MD  fexofenadine (ALLEGRA) 180 MG tablet Take 180 mg by mouth daily.    [provider]  GENERLAC 10 GM/15ML SOLN TAKE 30 MLS BY MOUTH THREE TIMES DAILY 08/10/17   [name], MD  Ginkgo Biloba 60 MG TABS Take 60 mg by mouth daily.     [provider]  lactulose (CHRONULAC) 10 GM/15ML solution TAKE 30 MLS BY MOUTH THREE TIMES DAILY 12/22/18   [name], MD  losartan (COZAAR) 25 MG tablet Take 25 mg by mouth daily.    [provider]  losartan (COZAAR) 25 MG  tablet TAKE 1 TABLET(25 MG) BY MOUTH DAILY 09/05/19   [name], MD  magic mouthwash w/lidocaine SOLN Take 5 mLs by mouth 3 (three) times daily as needed for mouth pain. 08/15/19   [place], Holli Humbles, NP  Multiple Vitamin (MULTIVITAMIN WITH MINERALS) TABS tablet Take 1 tablet by mouth daily.    [provider]  omega-3 acid ethyl esters (LOVAZA) 1 g capsule Take 1 g by mouth daily.    [provider]  pantoprazole (PROTONIX) 40 MG tablet TAKE 1 TABLET(40 MG) BY MOUTH DAILY 03/11/19   [name], MD  spironolactone (ALDACTONE) 50 MG tablet TAKE 1 TABLET(50 MG) BY MOUTH DAILY 08/19/19  [name], MD  vitamin B-12 (CYANOCOBALAMIN) 1000 MCG tablet Take 1,000 mcg by mouth daily.    [provider]  vitamin E 400 UNIT capsule Take 400 Units by mouth daily.    [provider]    Allergies    Bee venom, Codeine, Morphine and related, Phytonadione, Penicillins, and Promethazine hcl  Review of Systems   Review of Systems  Unable to perform ROS: Dementia (level 5 caveat)    Physical Exam Updated Vital Signs BP 106/70   Pulse 66   Temp (!) 100.8 F (38.2 C) (Axillary)   Resp 18   Ht 5' 3"  (1.6 m)   Wt 72.6 kg   SpO2 96%   BMI 28.34 kg/m   Physical Exam Vitals and nursing note reviewed.  Constitutional:      Appearance: She is well-developed.     Comments: Squirming on bed, mumbling  HENT:     Head: Normocephalic and atraumatic.  Eyes:     Conjunctiva/sclera: Conjunctivae normal.     Pupils: Pupils are equal, round, and reactive to light.  Cardiovascular:     Rate and Rhythm: Normal rate and regular rhythm.     Pulses: Normal pulses.  Pulmonary:     Effort: Pulmonary effort is normal. No respiratory distress.  Abdominal:     Palpations: Abdomen is soft.     Tenderness: There is no abdominal tenderness.     Comments: Urostomy bag with slightly cloudy, pink tinged urine output  Musculoskeletal:     Cervical back: Neck  supple.  Skin:    General: Skin is warm and dry.  Neurological:     Mental Status: She is alert. Mental status is at baseline.     Comments: Baseline per husband Moving all extremities Answers very simple questions     ED Results / Procedures / Treatments   Labs (all labs ordered are listed, but only abnormal results are displayed) Labs Reviewed  COMPREHENSIVE METABOLIC PANEL - Abnormal; Notable for the following components:      Result Value   Glucose, Bld 117 (*)    BUN 29 (*)    Total Protein 5.5 (*)    Albumin 2.9 (*)    Total Bilirubin 2.9 (*)    All other components within normal limits  LACTIC ACID, PLASMA - Abnormal; Notable for the following components:   Lactic Acid, Venous 4.0 (*)    All other components within normal limits  LACTIC ACID, PLASMA - Abnormal; Notable for the following components:   Lactic Acid, Venous 2.3 (*)    All other components within normal limits  CBC WITH DIFFERENTIAL/PLATELET - Abnormal; Notable for the following components:   WBC 2.5 (*)    RBC 3.25 (*)    Hemoglobin 10.2 (*)    HCT 31.5 (*)    RDW 17.3 (*)    Platelets 26 (*)    Lymphs Abs 0.1 (*)    All other components within normal limits  PROTIME-INR - Abnormal; Notable for the following components:   Prothrombin Time 21.2 (*)    INR 1.8 (*)    All other components within normal limits  URINALYSIS, ROUTINE W REFLEX MICROSCOPIC - Abnormal; Notable for the following components:   Color, Urine AMBER (*)    APPearance CLOUDY (*)    Protein, ur 100 (*)    Leukocytes,Ua TRACE (*)    Bacteria, UA RARE (*)    All other components within normal limits  RESPIRATORY PANEL BY RT PCR (FLU  A&B, COVID)  CULTURE, BLOOD (ROUTINE X 2)  CULTURE, BLOOD (ROUTINE X 2)    EKG None  Radiology DG Chest Port 1 View  Result Date: 10/12/2019 CLINICAL DATA:  Sepsis EXAM: PORTABLE CHEST 1 VIEW COMPARISON:  12/16/2018 FINDINGS: Cardiac enlargement without heart failure.  Left lung clear. Wispy  density in the right lung base appears to have progressed in the interval. No effusion. IMPRESSION: Wispy density in the right lung base may represent atelectasis, scarring or possibly pneumonia. Follow-up recommended. Electronically Signed   By: Franchot Gallo M.D.   On: 10/12/2019 10:47    Procedures .Critical Care Performed by: Wyvonnia Dusky, MD Authorized by: Wyvonnia Dusky, MD   Critical care provider statement:    Critical care time (minutes):  30   Critical care was necessary to treat or prevent imminent or life-threatening deterioration of the following conditions:  Sepsis   Critical care was time spent personally by me on the following activities:  Discussions with consultants, evaluation of patient's response to treatment, examination of patient, ordering and performing treatments and interventions, ordering and review of laboratory studies, ordering and review of radiographic studies, pulse oximetry, re-evaluation of patient's condition, obtaining history from patient or surrogate and review of old charts Comments:     IV antibiotics and IV fluids    (including critical care time)  Medications Ordered in ED Medications  sodium chloride 0.9 % bolus 2,000 mL (2,000 mLs Intravenous New Bag/Given 10/12/19 1015)  cefTRIAXone (ROCEPHIN) 1 g in sodium chloride 0.9 % 100 mL IVPB (0 g Intravenous Stopped 10/12/19 1121)  azithromycin (ZITHROMAX) 500 mg in sodium chloride 0.9 % 250 mL IVPB (500 mg Intravenous New Bag/Given 10/12/19 1204)    ED Course  I have reviewed the triage vital signs and the nursing notes.  Pertinent labs & imaging results that were available during my care of the patient were reviewed by me and considered in my medical decision making (see chart for details).  79 yo female presenting with fever and behavioral changes/AMS per husband's report concerning for UTI vs urosepsis vs. COVID vs other  Lower suspicion for meningitis, no photophobia, no nuccal  rigidity  Plan for sepsis labs, IV ceftriaxone, IVF   Clinical Course as of Oct 11 1305  Wed Oct 12, 2019  1203 Chronic thrombocytopenia   [MT]  1203 Will admit for sepsis   [MT]  1211 Patient's husband confirms that she is DNR/DNI   [MT]    Clinical Course User Index [MT] Anyla Israelson, Carola Rhine, MD   Final Clinical Impression(s) / ED Diagnoses Final diagnoses:  Sepsis [facility])    Rx / DC Orders ED Discharge Orders    None       Wyvonnia Dusky, MD 10/12/19 [phone]

## 2019-10-12 NOTE — ED Triage Notes (Signed)
Per EMS pt husband verbalizes pt not acting like self "usually expresses her needs; but altered and not walking at all; normally uses a wheely walker." Pt presents with fever and altered, "swarming."   EMS VS 102/61 BP 66 93 oxygen RA 185 CBG 106.2 temporal given 800 motrin with EMS  500 ml bolus of NaCl with EMS  Hx of bladder/colon cancer; concern for UTI.

## 2019-10-12 NOTE — ED Triage Notes (Addendum)
This writer was able to obtain labs ordered and one set of blood cultures from straight stick to right forearm.

## 2019-10-12 NOTE — Progress Notes (Signed)
CRITICAL VALUE ALERT  Critical Value: lactic acid 2.4 Date & Time Notied: 1/20 2245  Provider Notified: yes Orders Received/Actions taken: pending

## 2019-10-12 NOTE — ED Triage Notes (Signed)
Attempt IV to right hand unsuccessful x2.

## 2019-10-13 DIAGNOSIS — I10 Essential (primary) hypertension: Secondary | ICD-10-CM

## 2019-10-13 DIAGNOSIS — C679 Malignant neoplasm of bladder, unspecified: Secondary | ICD-10-CM

## 2019-10-13 DIAGNOSIS — D696 Thrombocytopenia, unspecified: Secondary | ICD-10-CM

## 2019-10-13 DIAGNOSIS — J9601 Acute respiratory failure with hypoxia: Secondary | ICD-10-CM

## 2019-10-13 LAB — CBC
HCT: 25.2 % — ABNORMAL LOW (ref 36.0–46.0)
HCT: 24.8 % — ABNORMAL LOW (ref 36.0–46.0)
HCT: 27.4 % — ABNORMAL LOW (ref 36.0–46.0)
Hemoglobin: 8.1 g/dL — ABNORMAL LOW (ref 12.0–15.0)
Hemoglobin: 7.9 g/dL — ABNORMAL LOW (ref 12.0–15.0)
Hemoglobin: 8.9 g/dL — ABNORMAL LOW (ref 12.0–15.0)
MCH: 31.3 pg (ref 26.0–34.0)
MCH: 31.2 pg (ref 26.0–34.0)
MCH: 31.7 pg (ref 26.0–34.0)
MCHC: 32.1 g/dL (ref 30.0–36.0)
MCHC: 31.9 g/dL (ref 30.0–36.0)
MCHC: 32.5 g/dL (ref 30.0–36.0)
MCV: 97.3 fL (ref 80.0–100.0)
MCV: 98 fL (ref 80.0–100.0)
MCV: 97.5 fL (ref 80.0–100.0)
Platelets: 22 10*3/uL — CL (ref 150–400)
Platelets: 23 10*3/uL — CL (ref 150–400)
Platelets: 32 10*3/uL — ABNORMAL LOW (ref 150–400)
RBC: 2.59 MIL/uL — ABNORMAL LOW (ref 3.87–5.11)
RBC: 2.53 MIL/uL — ABNORMAL LOW (ref 3.87–5.11)
RBC: 2.81 MIL/uL — ABNORMAL LOW (ref 3.87–5.11)
RDW: 17.6 % — ABNORMAL HIGH (ref 11.5–15.5)
RDW: 17.6 % — ABNORMAL HIGH (ref 11.5–15.5)
RDW: 17.4 % — ABNORMAL HIGH (ref 11.5–15.5)
WBC: 1.5 10*3/uL — ABNORMAL LOW (ref 4.0–10.5)
WBC: 1.6 10*3/uL — ABNORMAL LOW (ref 4.0–10.5)
WBC: 1.6 10*3/uL — ABNORMAL LOW (ref 4.0–10.5)
nRBC: 0 % (ref 0.0–0.2)
nRBC: 0 % (ref 0.0–0.2)
nRBC: 1.9 % — ABNORMAL HIGH (ref 0.0–0.2)

## 2019-10-13 LAB — PREPARE RBC (CROSSMATCH)

## 2019-10-13 LAB — BASIC METABOLIC PANEL
Anion gap: 10 (ref 5–15)
BUN: 36 mg/dL — ABNORMAL HIGH (ref 8–23)
CO2: 19 mmol/L — ABNORMAL LOW (ref 22–32)
Calcium: 9.1 mg/dL (ref 8.9–10.3)
Chloride: 110 mmol/L (ref 98–111)
Creatinine, Ser: 1.06 mg/dL — ABNORMAL HIGH (ref 0.44–1.00)
GFR calc Af Amer: 57 mL/min — ABNORMAL LOW (ref >60)
GFR calc non Af Amer: 50 mL/min — ABNORMAL LOW (ref >60)
Glucose, Bld: 126 mg/dL — ABNORMAL HIGH (ref 70–99)
Potassium: 4.9 mmol/L (ref 3.5–5.1)
Sodium: 139 mmol/L (ref 135–145)

## 2019-10-13 LAB — HIV ANTIBODY (ROUTINE TESTING W REFLEX): HIV Screen 4th Generation wRfx: NONREACTIVE

## 2019-10-13 LAB — PROCALCITONIN: Procalcitonin: 19.14 ng/mL

## 2019-10-13 MED ORDER — LIP MEDEX EX OINT
TOPICAL_OINTMENT | CUTANEOUS | Status: DC | PRN
  Filled 2019-10-13: qty 7

## 2019-10-13 MED ORDER — OCTREOTIDE LOAD VIA INFUSION
25.0000 ug | Freq: Once | INTRAVENOUS | Status: AC
  Administered 2019-10-13: 25 ug via INTRAVENOUS
  Filled 2019-10-13: qty 13

## 2019-10-13 MED ORDER — SODIUM CHLORIDE 0.9% IV SOLUTION: Freq: Once | INTRAVENOUS | Status: AC

## 2019-10-13 MED ORDER — CITALOPRAM HYDROBROMIDE 20 MG PO TABS
20.0000 mg | ORAL_TABLET | Freq: Every day | ORAL | Status: DC
  Administered 2019-10-13 – 2019-10-17 (×4): 20 mg via ORAL
  Filled 2019-10-13 (×5): qty 1

## 2019-10-13 MED ORDER — SODIUM CHLORIDE 0.9 % IV SOLN
50.0000 ug/h | INTRAVENOUS | Status: DC
  Administered 2019-10-13 – 2019-10-16 (×8): 50 ug/h via INTRAVENOUS
  Filled 2019-10-13 (×13): qty 1

## 2019-10-13 MED ORDER — CHLORHEXIDINE GLUCONATE 0.12 % MT SOLN
15.0000 mL | Freq: Two times a day (BID) | OROMUCOSAL | Status: DC
  Administered 2019-10-13 – 2019-10-17 (×9): 15 mL via OROMUCOSAL
  Filled 2019-10-13 (×5): qty 15

## 2019-10-13 MED ORDER — SODIUM CHLORIDE 0.9 % IV BOLUS
250.0000 mL | Freq: Once | INTRAVENOUS | Status: AC
  Administered 2019-10-13: 250 mL via INTRAVENOUS

## 2019-10-13 MED ORDER — LACTULOSE 10 GM/15ML PO SOLN
10.0000 g | Freq: Two times a day (BID) | ORAL | Status: DC
  Administered 2019-10-14 – 2019-10-17 (×2): 10 g via ORAL
  Filled 2019-10-13 (×4): qty 15

## 2019-10-13 MED ORDER — CHLORHEXIDINE GLUCONATE CLOTH 2 % EX PADS
6.0000 | MEDICATED_PAD | Freq: Every day | CUTANEOUS | Status: DC
  Administered 2019-10-13 – 2019-10-17 (×5): 6 via TOPICAL

## 2019-10-13 MED ORDER — ORAL CARE MOUTH RINSE
15.0000 mL | Freq: Two times a day (BID) | OROMUCOSAL | Status: DC
  Administered 2019-10-13 – 2019-10-16 (×6): 15 mL via OROMUCOSAL

## 2019-10-13 NOTE — Progress Notes (Signed)
Patient transferred to room 1234 w/ husband and all belongings, Report given to [employer].

## 2019-10-13 NOTE — Progress Notes (Signed)
Attended to patient r/t hygiene, and skin assessment. Upon rolling patient to right side, patient had sudden expulsion of large amount of frank blood, at least 200 mL worth. Placed stat page to attending MD. While cleaning patient she continued to seep another 100-200 mL frank dark blood. Orders received, vital signs checked, family notified. Received telephone consent w/ two nurses from patient's husband Mariea Clonts and daughter [name]. Will cont to treat and monitor.

## 2019-10-13 NOTE — Evaluation (Signed)
SLP Cancellation Note  Patient Details Name: [name] MRN: [id number] DOB: 09/25/1940   Cancelled treatment:       Reason Eval/Treat Not Completed: Medical issues which prohibited therapy(pt noted to have rectal bleeding, spoke to RN to ascertain plan, asked her to call if pt able to have po/eval)   Macario Golds 10/13/2019, 8:41 AM

## 2019-10-13 NOTE — Progress Notes (Signed)
Pilar Plate rectal bleed, approx 50-100cc abdomen soft/tender touch b/p 84/53 pulse 58 resp 18 O2 sat 100% on 2l  No apparent distress  On call notified

## 2019-10-13 NOTE — Consult Note (Addendum)
Referring Provider:  [facility] Primary Care Physician:  [name], MD Primary Gastroenterologist:  Dr. [name]   Reason for Consultation:  GI bleed   HPI: [name] is a 79 y.o. female with past medical history of cirrhosis most likely from [place], history of stage II colon cancer, history of stage IV transitional cell carcinoma of the bladder, recurrent anemia and chronic thrombocytopenia was admitted to the hospital with confusion.  She was diagnosed with community-acquired pneumonia.  Covid negative.  Currently on antibiotics.  Patient seen and examined at bedside.  Patient is confused.  Discussed with nursing staff..  She was found to have maroon-colored rectal bleeding.  platelet count of 22.  GI is consulted for further evaluation.  Past Medical History:  Diagnosis Date  . Anemia, iron deficiency   . Arthritis   . Bladder cancer [facility]) 2003   Bladder removed - [name]  . Cataract immature   . Cirrhosis ([place]) 08/26/2011  . Colon cancer [facility]) 2001   Hemicolectomy - [name]  . Depression   . GERD (gastroesophageal reflux disease)   . Headache(784.0)   . Hemorrhoids   . History of colon polyps   . History of DVT (deep vein thrombosis) 50+yrs ago   With pregnancy  . History of GI bleed   . History of kidney stones   . History of urostomy    Urostomy d/t hx bladder cancer  . Hx of transfusion of whole blood   . Hypertension   . Joint pain   . Memory loss    Chemotherapy; short and long term  . Osteoporosis   . Thrombocytopenia ([place])     Past Surgical History:  Procedure Laterality Date  . ABDOMINAL HYSTERECTOMY    . APPENDECTOMY    . BLADDER SURGERY     bladder removed d/t cancer  . CHOLECYSTECTOMY    . COLON RESECTION     with colostomy then colostomy reversal  . COLON SURGERY  2004  . COLON SURGERY  2001  . COLONOSCOPY    . ESOPHAGOGASTRODUODENOSCOPY  02/27/2012   Procedure: ESOPHAGOGASTRODUODENOSCOPY (EGD);  Surgeon: [name], MD;  Location: [name];  Service: Endoscopy;  Laterality: N/A;  bedside case  . NEPHROLITHOTOMY Left 05/04/2014   Procedure: LEFT PERCUTANEOUS NEPHROLITHOTOMY ;  Surgeon: [name], MD;  Location: [facility] ORS;  Service: Urology;  Laterality: Left;  . TOTAL KNEE ARTHROPLASTY  2010   Left knee    Prior to Admission medications   Medication Sig Start Date End Date Taking? Authorizing Provider  acidophilus (RISAQUAD) CAPS capsule Take 1 capsule by mouth daily.   Yes [provider]  Apoaequorin (PREVAGEN EXTRA STRENGTH) 20 MG CAPS Take 20 mg by mouth daily.   Yes [provider]  Ascorbic Acid (VITAMIN C) 1000 MG tablet Take 1,000 mg by mouth daily.   Yes [provider]  cholecalciferol (VITAMIN D) 1000 units tablet Take 1,000 Units by mouth daily.   Yes [provider]  Cinnamon 500 MG capsule Take 500 mg by mouth daily.    Yes [provider]  citalopram (CELEXA) 40 MG tablet TAKE 1 TABLET BY MOUTH EVERY DAY Patient taking differently: Take 40 mg by mouth.  05/17/19  Yes [place], Cammie Mcgee, MD  fexofenadine (ALLEGRA) 180 MG tablet Take 180 mg by mouth daily.   Yes [provider]  Ginkgo Biloba 60 MG TABS Take 60 mg by mouth daily.    Yes [provider]  lactulose (  CHRONULAC) 10 GM/15ML solution TAKE 30 MLS BY MOUTH THREE TIMES DAILY Patient taking differently: Take 20 g by mouth daily.  12/22/18  Yes [name], MD  losartan (COZAAR) 25 MG tablet Take 25 mg by mouth daily.   Yes [provider]  losartan (COZAAR) 25 MG tablet TAKE 1 TABLET(25 MG) BY MOUTH DAILY Patient taking differently: Take 25 mg by mouth daily.  09/05/19  Yes [name], MD  magic mouthwash w/lidocaine SOLN Take 5 mLs by mouth 3 (three) times daily as needed for mouth pain. 08/15/19  Yes [place], Holli Humbles, NP  Multiple Vitamin (MULTIVITAMIN WITH MINERALS) TABS tablet Take 1 tablet by mouth daily.   Yes [provider]  omega-3 acid ethyl esters  (LOVAZA) 1 g capsule Take 1 g by mouth daily.   Yes [provider]  pantoprazole (PROTONIX) 40 MG tablet TAKE 1 TABLET(40 MG) BY MOUTH DAILY Patient taking differently: Take 40 mg by mouth daily.  03/11/19  Yes [name], MD  spironolactone (ALDACTONE) 50 MG tablet TAKE 1 TABLET(50 MG) BY MOUTH DAILY Patient taking differently: Take 50 mg by mouth daily.  08/19/19  Yes [place], Rudell Cobb, MD  vitamin B-12 (CYANOCOBALAMIN) 1000 MCG tablet Take 1,000 mcg by mouth daily.   Yes [provider]  vitamin E 400 UNIT capsule Take 400 Units by mouth daily.   Yes [provider]  GENERLAC 10 GM/15ML SOLN TAKE 30 MLS BY MOUTH THREE TIMES DAILY Patient not taking: Reported on 10/12/2019 08/10/17   [name], MD    Scheduled Meds: . sodium chloride   Intravenous Once  . sodium chloride   Intravenous Once  . citalopram  40 mg Oral Daily  . pantoprazole  40 mg Oral Daily   Continuous Infusions: . sodium chloride 100 mL/hr at 10/13/19 0436  . azithromycin    . cefTRIAXone (ROCEPHIN)  IV    . octreotide  (SANDOSTATIN)    IV infusion 50 mcg/hr (10/13/19 0445)   PRN Meds:.acetaminophen **OR** acetaminophen, ondansetron **OR** ondansetron (ZOFRAN) IV, senna-docusate  Allergies as of 10/12/2019 - Review Complete 10/12/2019  Allergen Reaction Noted  . Bee venom Anaphylaxis 03/14/2017  . Codeine Anaphylaxis and Rash 09/22/2011  . Morphine and related Anaphylaxis and Rash 09/22/2011  . Phytonadione Other (See Comments) 04/07/2014  . Penicillins Rash and Other (See Comments) 04/25/2013  . Promethazine hcl Other (See Comments) 09/25/2011    Family History  Problem Relation Age of Onset  . Diabetes Brother   . Cancer Other   . Hypertension Other     Social History   Socioeconomic History  . Marital status: Married    Spouse name: Not on file  . Number of children: Not on file  . Years of education: Not on file  . Highest education level: Not on file   Occupational History  . Not on file  Tobacco Use  . Smoking status: Former Smoker    Packs/day: 0.50    Years: 35.00    Pack years: 17.50    Types: Cigarettes    Start date: 10/25/1955    Quit date: 09/22/1991    Years since quitting: 28.0  . Smokeless tobacco: Never Used  . Tobacco comment: quit 23years ago  Substance and Sexual Activity  . Alcohol use: No    Alcohol/week: 0.0 standard drinks  . Drug use: No  . Sexual activity: Yes    Birth control/protection: Surgical  Other Topics Concern  . Not on file  Social History Narrative   Lives at home with her husband, ambulatory   Social Determinants of Health   Financial Resource Strain:   . Difficulty of Paying Living Expenses: Not on file  Food Insecurity:   . Worried About Charity fundraiser in the Last Year: Not on file  . Ran Out of Food in the Last Year: Not on file  Transportation Needs:   . Lack of Transportation (Medical): Not on file  . Lack of Transportation (Non-Medical): Not on file  Physical Activity:   . Days of Exercise per Week: Not on file  . Minutes of Exercise per Session: Not on file  Stress:   . Feeling of Stress : Not on file  Social Connections:   . Frequency of Communication with Friends and Family: Not on file  . Frequency of Social Gatherings with Friends and Family: Not on file  . Attends Religious Services: Not on file  . Active Member of Clubs or Organizations: Not on file  . Attends Archivist Meetings: Not on file  . Marital Status: Not on file  Intimate Partner Violence:   . Fear of Current or Ex-Partner: Not on file  . Emotionally Abused: Not on file  . Physically Abused: Not on file  . Sexually Abused: Not on file    Review of Systems: Unable to obtain because of confusion  Physical Exam: Vital signs: Vitals:   10/13/19 0904 10/13/19 0927  BP: (!) 96/54 (!) 106/48  Pulse: (!) 53 (!) 55  Resp: 18   Temp: 99.2 F (37.3 C)   SpO2: 99% 100%   Last BM Date:  10/11/19(Frank blood but no stool noted) Physical Exam  Constitutional: She appears well-developed and well-nourished.  HENT:  Head: Normocephalic and atraumatic.  Mouth/Throat: Oropharynx is clear and moist. No oropharyngeal exudate.  Eyes: EOM are normal. No scleral icterus.  Cardiovascular: Normal rate and normal heart sounds.  Pulmonary/Chest: Effort normal. No respiratory distress.  Anterior exam only  Abdominal: Soft. Bowel sounds are normal. She exhibits no distension. There is no abdominal tenderness. There is no rebound and no guarding.  Scar marks and urostomy noted.  Musculoskeletal:        General: No edema.     Cervical back: Normal range of motion and neck supple.  Neurological:  Confused  Skin: Skin is dry. No erythema.  Psychiatric:  Confused  Vitals reviewed.   GI:  Lab Results: Recent Labs    10/12/19 1012 10/13/19 0600  WBC 2.5* 1.5*  HGB 10.2* 8.1*  HCT 31.5* 25.2*  PLT 26* 22*   BMET Recent Labs    10/12/19 1012 10/13/19 0600  NA 140 139  K 4.0 4.9  CL 108 110  CO2 24 19*  GLUCOSE 117* 126*  BUN 29* 36*  CREATININE 0.90 1.06*  CALCIUM 10.3 9.1   LFT Recent Labs    10/12/19 1012  PROT 5.5*  ALBUMIN 2.9*  AST 29  ALT 18  ALKPHOS 64  BILITOT 2.9*   PT/INR Recent Labs    10/12/19 1012  LABPROT 21.2*  INR 1.8*     Studies/Results: DG Chest Port 1 View  Result Date: 10/12/2019 CLINICAL DATA:  Sepsis EXAM: PORTABLE CHEST 1 VIEW COMPARISON:  12/16/2018 FINDINGS: Cardiac enlargement without heart failure.  Left lung clear. Wispy density in the right lung base appears to have progressed in the interval. No effusion. IMPRESSION: Wispy density in the right lung base may represent atelectasis, scarring or possibly pneumonia. Follow-up  recommended. Electronically Signed   By: Franchot Gallo M.D.   On: 10/12/2019 10:47    Impression/Plan: -Rectal bleeding with maroon-colored stool in setting of severe thrombocytopenia with platelet  counts of 22. -Pancytopenia. - cirrhosis of the liver.  Most likely [place] related. -Confusion.  Could be from underlying infection such as pneumonia or could be from liver disease. -Remote history of colon cancer -History of bladder cancer status post urostomy  Recommendations ------------------------ -Transfused 2 units of platelets.  Not a candidate for endoscopy because of severe thrombocytopenia -Repeat CBC after PRBC and platelet transfusion.  Recheck INR tomorrow morning -Continue octreotide, Protonix and antibiotics -Start lactulose.  Hold for diarrhea -Continue supportive care -GI will follow    LOS: 1 day   Otis Brace  MD, FACP 10/13/2019, 9:48 AM  Contact #  [phone]

## 2019-10-13 NOTE — Progress Notes (Signed)
OT Cancellation Note  Patient Details Name: [name] MRN: [id number] DOB: 1941/05/06   Cancelled Treatment:    Reason Eval/Treat Not Completed: Medical issues which prohibited therapy.  Pt with rectal bleeding. Per chart, she has advanced dementia and was walking and expressing needs.  Will check back to see if she will benefit from OT  [facility] 10/13/2019, 12:33 PM  Emmali Karow S, OTR/L Acute Rehabilitation Services 10/13/2019

## 2019-10-13 NOTE — Progress Notes (Signed)
Moderate frank rectal bleeding persists, b/p 96/48 p 55 resp 18  Afebrile Abdomen remains tender  On call Aware

## 2019-10-13 NOTE — Progress Notes (Signed)
PROGRESS NOTE  BRADI ARBUTHNOT MVE:[id number] DOB: [birth year] DOA: 10/12/2019 PCP: [name], MD  HPI/Recap of past 24 hours: HPI from Dr [place] [name] is a 79 y.o. female with medical history significant of advanced dementia, depression, essential hypertension, GERD, and bladder cancer status post ostomy who presented to the ED via EMS for worsening confusion, decreased oral intake and progressive weakness. Patient with history advanced dementia and husband at bedside gives majority of the HPI. Husband reports that her dementia has slowly advanced over the last 2 years, but at baseline she is able to ambulate with the use of a walker.  Currently lives at home with husband with occasional outside support. In the ED, VS showed: Temp of  100.8, HR 63, RR 20, BP 99/88, SPO2 95% on 2 L nasal cannula.  WBC count 2.5, hemoglobin 10.2, platelet count 26, BUN 29, creatinine 0.90, Lactic acid 4.0. Covid-19/SARS-CoV-2 PCR negative.  Rapid influenza negative.  INR 1.8.  Chest x-ray with right lower base density.  Patient was given a 2 L NS bolus with azithromycin and ceftriaxone.  ED physician referred for admission for acute hypoxic respiratory failure/sepsis likely secondary to community-acquired pneumonia.    Overnight, patient noted to have episodes of frank rectal bleed approximately 100 cc, with hypotension.  Patient continues to have frank blood per rectum, last this a.m. of about 200 mils.  Patient unable to indicate if she is in pain or any other new complaints.  Resting in bed comfortably    Assessment/Plan: Principal Problem:   Acute respiratory failure with hypoxia (HCC) Active Problems:   Bladder cancer (HCC)   Thrombocytopenia (HCC)   Dementia without behavioral disturbance ([place])   Sepsis ([place])   Essential hypertension   Community acquired pneumonia   Acute hypoxic respiratory failure likely 2/2 CAP Sepsis, present on admission, ??UTI Afebrile, leukopenia  (chronic) Lactic acid of 4.0-->2.4, will trend BC X 2 pending Procalcitonin elevated 17--> 19, will trend Covid-19/SARS-CoV-2 PCR negative.  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 for now Monitor closely  Essential hypertension Currently hypotensive Hold home losartan and spironolactone Continue IV fluids, blood transfusion  Depression Continue Celexa 40 mg p.o. daily  GERD Continue PPI  Hx bladder cancer status post urostomy Wound ostomy consult for continue management of urostomy while inpatient  Remote history of stage II adenocarcinoma of the colon         Malnutrition Type:      Malnutrition Characteristics:      Nutrition Interventions:       Estimated body mass index is 28.34 kg/m as calculated from the following:   Height as of this encounter: 5' 3"  (1.6 m).   Weight as of this encounter: 72.6 kg.     Code Status: DNR  Family Communication: Attempted to reach spouse on 10/13/2019,  unavailable  Disposition Plan: To be determined   Consultants:  GI  Procedures:  None  Antimicrobials:  Azithromycin  Ceftriaxone  DVT prophylaxis: SCDs   Objective: Vitals:   10/13/19 0642  10/13/19 0904 10/13/19 0927 10/13/19 1115  BP: (!) 96/48 (!) 96/54 (!) 106/48 (!) (P) 115/59  Pulse: (!) 55 (!) 53 (!) 55 (!) (P) 53  Resp: 18 18  (!) (P) 26  Temp: 98.3 F (36.8 C) 99.2 F (37.3 C)  (P) 98.1 F (36.7 C)  TempSrc: Oral Axillary  (P) Oral  SpO2: 95% 99% 100% (P) 100%  Weight:      Height:        Intake/Output Summary (Last 24 hours) at 10/13/2019 1127 Last data filed at 10/13/2019 0600 Gross per 24 hour  Intake 1117.55 ml  Output --  Net 1117.55 ml   Filed Weights   10/12/19 0946  Weight: 72.6 kg    Exam:  General: NAD, chronically ill-appearing, elderly  Cardiovascular: S1, S2 present  Respiratory:  Diminished breath sounds bilaterally  Abdomen: Soft, nontender, nondistended, bowel sounds present, urostomy noted  Musculoskeletal: No bilateral pedal edema noted  Skin: Normal  Psychiatry: Poor judgment and insight   Data Reviewed: CBC: Recent Labs  Lab 10/12/19 1012 10/13/19 0600 10/13/19 1004  WBC 2.5* 1.5* 1.6*  NEUTROABS 2.2  --   --   HGB 10.2* 8.1* 7.9*  HCT 31.5* 25.2* 24.8*  MCV 96.9 97.3 98.0  PLT 26* 22* 23*   Basic Metabolic Panel: Recent Labs  Lab 10/12/19 1012 10/13/19 0600  NA 140 139  K 4.0 4.9  CL 108 110  CO2 24 19*  GLUCOSE 117* 126*  BUN 29* 36*  CREATININE 0.90 1.06*  CALCIUM 10.3 9.1   GFR: Estimated Creatinine Clearance: 40.4 mL/min (A) (by C-G formula based on SCr of 1.06 mg/dL (H)). Liver Function Tests: Recent Labs  Lab 10/12/19 1012  AST 29  ALT 18  ALKPHOS 64  BILITOT 2.9*  PROT 5.5*  ALBUMIN 2.9*   No results for input(s): LIPASE, AMYLASE in the last 168 hours. No results for input(s): AMMONIA in the last 168 hours. Coagulation Profile: Recent Labs  Lab 10/12/19 1012  INR 1.8*   Cardiac Enzymes: No results for input(s): CKTOTAL, CKMB, CKMBINDEX, TROPONINI in the last 168 hours. BNP (last 3 results) No results for input(s): PROBNP in the last 8760 hours. HbA1C: No results for  input(s): HGBA1C in the last 72 hours. CBG: No results for input(s): GLUCAP in the last 168 hours. Lipid Profile: No results for input(s): CHOL, HDL, LDLCALC, TRIG, CHOLHDL, LDLDIRECT in the last 72 hours. Thyroid Function Tests: Recent Labs    10/12/19 1905  TSH 0.482   Anemia Panel: No results for input(s): VITAMINB12, FOLATE, FERRITIN, TIBC, IRON, RETICCTPCT in the last 72 hours. Urine analysis:    Component Value Date/Time   COLORURINE AMBER (A) 10/12/2019 1218   APPEARANCEUR CLOUDY (A) 10/12/2019 1218   LABSPEC 1.015 10/12/2019 1218   PHURINE 7.0 10/12/2019 [place] 10/12/2019 1218   HGBUR NEGATIVE 10/12/2019 [place] 10/12/2019 [name] 10/12/2019 1218   PROTEINUR 100 (A) 10/12/2019 1218   UROBILINOGEN 1.0 04/06/2014 1856   NITRITE NEGATIVE 10/12/2019 1218   LEUKOCYTESUR TRACE (A) 10/12/2019 1218   Sepsis Labs: @LABRCNTIP (procalcitonin:4,lacticidven:4)  ) Recent Results (from the past 240 hour(s))  Culture, blood (Routine x 2)     Status: None (Preliminary result)   Collection Time: 10/12/19 10:12  AM   Specimen: BLOOD  Result Value Ref Range Status   Specimen Description   Final    BLOOD BLOOD LEFT FOREARM Performed at [facility] [address], [place], [place] [address]    Special Requests   Final    BOTTLES DRAWN AEROBIC AND ANAEROBIC Blood Culture adequate volume Performed at [facility] [address], [place], [place] [address]    Culture   Final    NO GROWTH < 24 HOURS Performed at [facility] [address], [place], [place] [address]    Report Status PENDING  Incomplete  Respiratory Panel by RT PCR (Flu A&B, Covid) - Nasopharyngeal Swab     Status: None   Collection Time: 10/12/19 10:12 AM   Specimen: Nasopharyngeal Swab  Result Value Ref Range Status   SARS Coronavirus 2 by RT PCR NEGATIVE NEGATIVE Final    Comment: (NOTE) SARS-CoV-2 target  nucleic acids are NOT DETECTED. The SARS-CoV-2 RNA is generally detectable in upper respiratoy specimens during the acute phase of infection. The lowest concentration of SARS-CoV-2 viral copies this assay can detect is 131 copies/mL. A negative result does not preclude SARS-Cov-2 infection and should not be used as the sole basis for treatment or other patient management decisions. A negative result may occur with  improper specimen collection/handling, submission of specimen other than nasopharyngeal swab, presence of viral mutation(s) within the areas targeted by this assay, and inadequate number of viral copies (<131 copies/mL). A negative result must be combined with clinical observations, patient history, and epidemiological information. The expected result is Negative. Fact Sheet for Patients:  PinkCheek.be Fact Sheet for Healthcare Providers:  GravelBags.it This test is not yet ap proved or cleared by the [place] FDA and  has been authorized for detection and/or diagnosis of SARS-CoV-2 by FDA under an Emergency Use Authorization (EUA). This EUA will remain  in effect (meaning this test can be used) for the duration of the COVID-19 declaration under Section 564(b)(1) of the Act, 21 U.S.C. section 360bbb-3(b)(1), unless the authorization is terminated or revoked sooner.    Influenza A by PCR NEGATIVE NEGATIVE Final   Influenza B by PCR NEGATIVE NEGATIVE Final    Comment: (NOTE) The Xpert Xpress SARS-CoV-2/FLU/RSV assay is intended as an aid in  the diagnosis of influenza from Nasopharyngeal swab specimens and  should not be used as a sole basis for treatment. Nasal washings and  aspirates are unacceptable for Xpert Xpress SARS-CoV-2/FLU/RSV  testing. Fact Sheet for Patients: PinkCheek.be Fact Sheet for Healthcare Providers: GravelBags.it This test is not  yet approved or cleared by the [place] FDA and  has been authorized for detection and/or diagnosis of SARS-CoV-2 by  FDA under an Emergency Use Authorization (EUA). This EUA will remain  in effect (meaning this test can be used) for the duration of the  Covid-19 declaration under Section 564(b)(1) of the Act, 21  U.S.C. section 360bbb-3(b)(1), unless the authorization is  terminated or revoked. Performed at [facility], [place] [address] [place], [place] [address]       Studies: No results found.  Scheduled Meds: . sodium chloride   Intravenous Once  . sodium chloride   Intravenous Once  . sodium chloride   Intravenous Once  . citalopram  40 mg Oral Daily  . lactulose  10 g Oral BID  . pantoprazole  40 mg Oral Daily    Continuous Infusions: . sodium chloride 100 mL/hr at 10/13/19 0436  .  azithromycin    . cefTRIAXone (ROCEPHIN)  IV    . octreotide  (SANDOSTATIN)    IV infusion 50 mcg/hr (10/13/19 0445)     LOS: 1 day     Alma Friendly, MD [facility]  If 7PM-7AM, please contact night-coverage [email or web address] 10/13/2019, 11:27 AM

## 2019-10-13 NOTE — Progress Notes (Signed)
PT Cancellation Note  Patient Details Name: [name] MRN: [id number] DOB: August 10, 1941   Cancelled Treatment:    Reason Eval/Treat Not Completed: Medical issues which prohibited therapy  Pt getting telemetry and likely transfer to ICU today.  Will hold therapy today.   [name],[name] 10/13/2019, 9:30 AM  Arlyce Dice, DPT Acute Rehabilitation Services Office: [phone]

## 2019-10-13 NOTE — Progress Notes (Signed)
Initial Nutrition Assessment  INTERVENTION:   Once diet advanced,  -Boost Breeze po TID, each supplement provides 250 kcal and 9 grams of protein -Multivitamin with minerals daily  NUTRITION DIAGNOSIS:   Inadequate oral intake related to lethargy/confusion as evidenced by per patient/family report.  GOAL:   Patient will meet greater than or equal to 90% of their needs  MONITOR:   Diet advancement, Labs, Weight trends, I & O's  REASON FOR ASSESSMENT:   Consult Assessment of nutrition requirement/status  ASSESSMENT:   79 y.o. female with medical history significant of advanced dementia, depression, essential hypertension, GERD, and bladder cancer status post ostomy who presented to the ED via EMS for worsening confusion. Pt admitted for acute hypoxic respiratory failure/sepsis likely secondary to community-acquired pneumonia  Patient was on a regular diet this morning until pt began to rectal bleeding. Diet now NPO.  Pt with dementia, alert/oriented x 1. Per chart review, pt was not eating PTA given increased confusion. Pt with history of bladder and colon cancer. SLP has been consulted to evaluate.  Recommend nutritional supplements once diet is advanced.  Per weight records, pt has lost 54 lbs since 04/14/19 (25% wt loss x 6 months, significant for time frame).   Labs reviewed. Medications reviewed.  NUTRITION - FOCUSED PHYSICAL EXAM:  Deferred.  Diet Order:   Diet Order            Diet NPO time specified Except for: Sips with Meds  Diet effective now              EDUCATION NEEDS:   No education needs have been identified at this time  Skin:  Skin Assessment: Reviewed RN Assessment  Last BM:  1/21 -rectal bleeding  Height:   Ht Readings from Last 1 Encounters:  10/12/19 5' 3"  (1.6 m)    Weight:   Wt Readings from Last 1 Encounters:  10/12/19 72.6 kg    Ideal Body Weight:  52.3 kg  BMI:  Body mass index is 28.34 kg/m.  Estimated Nutritional  Needs:   Kcal:  1700-1900  Protein:  70-85g  Fluid:  1.8L/day  Clayton Bibles, MS, RD, LDN Inpatient Clinical Dietitian Pager: [phone] After Hours Pager: [phone]

## 2019-10-14 ENCOUNTER — Encounter (HOSPITAL_COMMUNITY): Payer: Self-pay | Admitting: Internal Medicine

## 2019-10-14 DIAGNOSIS — Z85038 Personal history of other malignant neoplasm of large intestine: Secondary | ICD-10-CM

## 2019-10-14 DIAGNOSIS — D689 Coagulation defect, unspecified: Secondary | ICD-10-CM

## 2019-10-14 DIAGNOSIS — K7581 Nonalcoholic steatohepatitis (NASH): Secondary | ICD-10-CM

## 2019-10-14 DIAGNOSIS — R161 Splenomegaly, not elsewhere classified: Secondary | ICD-10-CM

## 2019-10-14 DIAGNOSIS — C189 Malignant neoplasm of colon, unspecified: Secondary | ICD-10-CM

## 2019-10-14 DIAGNOSIS — D61818 Other pancytopenia: Secondary | ICD-10-CM

## 2019-10-14 DIAGNOSIS — F0390 Unspecified dementia without behavioral disturbance: Secondary | ICD-10-CM

## 2019-10-14 DIAGNOSIS — K7460 Unspecified cirrhosis of liver: Secondary | ICD-10-CM

## 2019-10-14 DIAGNOSIS — N390 Urinary tract infection, site not specified: Secondary | ICD-10-CM

## 2019-10-14 DIAGNOSIS — K922 Gastrointestinal hemorrhage, unspecified: Secondary | ICD-10-CM

## 2019-10-14 DIAGNOSIS — J189 Pneumonia, unspecified organism: Secondary | ICD-10-CM

## 2019-10-14 DIAGNOSIS — A419 Sepsis, unspecified organism: Principal | ICD-10-CM

## 2019-10-14 LAB — PREPARE FRESH FROZEN PLASMA: Unit division: 0

## 2019-10-14 LAB — TYPE AND SCREEN
ABO/RH(D): A POS
Antibody Screen: NEGATIVE
Unit division: 0
Unit division: 0

## 2019-10-14 LAB — CBC WITH DIFFERENTIAL/PLATELET
Abs Immature Granulocytes: 0.01 10*3/uL (ref 0.00–0.07)
Basophils Absolute: 0 10*3/uL (ref 0.0–0.1)
Basophils Relative: 0 %
Eosinophils Absolute: 0 10*3/uL (ref 0.0–0.5)
Eosinophils Relative: 2 %
HCT: 25.9 % — ABNORMAL LOW (ref 36.0–46.0)
Hemoglobin: 8.5 g/dL — ABNORMAL LOW (ref 12.0–15.0)
Immature Granulocytes: 1 %
Lymphocytes Relative: 23 %
Lymphs Abs: 0.3 10*3/uL — ABNORMAL LOW (ref 0.7–4.0)
MCH: 31.7 pg (ref 26.0–34.0)
MCHC: 32.8 g/dL (ref 30.0–36.0)
MCV: 96.6 fL (ref 80.0–100.0)
Monocytes Absolute: 0.3 10*3/uL (ref 0.1–1.0)
Monocytes Relative: 22 %
Neutro Abs: 0.7 10*3/uL — ABNORMAL LOW (ref 1.7–7.7)
Neutrophils Relative %: 52 %
Platelets: 30 10*3/uL — ABNORMAL LOW (ref 150–400)
RBC: 2.68 MIL/uL — ABNORMAL LOW (ref 3.87–5.11)
RDW: 17.4 % — ABNORMAL HIGH (ref 11.5–15.5)
WBC: 1.3 10*3/uL — CL (ref 4.0–10.5)
nRBC: 0 % (ref 0.0–0.2)

## 2019-10-14 LAB — COMPREHENSIVE METABOLIC PANEL
ALT: 23 U/L (ref 0–44)
AST: 41 U/L (ref 15–41)
Albumin: 2.4 g/dL — ABNORMAL LOW (ref 3.5–5.0)
Alkaline Phosphatase: 43 U/L (ref 38–126)
Anion gap: 6 (ref 5–15)
BUN: 42 mg/dL — ABNORMAL HIGH (ref 8–23)
CO2: 22 mmol/L (ref 22–32)
Calcium: 9 mg/dL (ref 8.9–10.3)
Chloride: 115 mmol/L — ABNORMAL HIGH (ref 98–111)
Creatinine, Ser: 0.9 mg/dL (ref 0.44–1.00)
GFR calc Af Amer: >60 mL/min (ref >60)
GFR calc non Af Amer: >60 mL/min (ref >60)
Glucose, Bld: 125 mg/dL — ABNORMAL HIGH (ref 70–99)
Potassium: 4.5 mmol/L (ref 3.5–5.1)
Sodium: 143 mmol/L (ref 135–145)
Total Bilirubin: 1.8 mg/dL — ABNORMAL HIGH (ref 0.3–1.2)
Total Protein: 4.8 g/dL — ABNORMAL LOW (ref 6.5–8.1)

## 2019-10-14 LAB — BPAM PLATELET PHERESIS
Blood Product Expiration Date: 202101212359
Blood Product Expiration Date: 202101222359
ISSUE DATE / TIME: 202101211718
ISSUE DATE / TIME: 202101211948
Unit Type and Rh: 5100
Unit Type and Rh: 7300

## 2019-10-14 LAB — PROTIME-INR
INR: 1.8 — ABNORMAL HIGH (ref 0.8–1.2)
Prothrombin Time: 20.4 seconds — ABNORMAL HIGH (ref 11.4–15.2)

## 2019-10-14 LAB — BPAM RBC
Blood Product Expiration Date: 202102032359
Blood Product Expiration Date: 202102032359
ISSUE DATE / TIME: 202101211122
ISSUE DATE / TIME: 202101211456
Unit Type and Rh: 6200
Unit Type and Rh: 6200

## 2019-10-14 LAB — CBC
HCT: 28.1 % — ABNORMAL LOW (ref 36.0–46.0)
Hemoglobin: 8.6 g/dL — ABNORMAL LOW (ref 12.0–15.0)
MCH: 30.5 pg (ref 26.0–34.0)
MCHC: 30.6 g/dL (ref 30.0–36.0)
MCV: 99.6 fL (ref 80.0–100.0)
Platelets: 40 10*3/uL — ABNORMAL LOW (ref 150–400)
RBC: 2.82 MIL/uL — ABNORMAL LOW (ref 3.87–5.11)
RDW: 17.5 % — ABNORMAL HIGH (ref 11.5–15.5)
WBC: 1.7 10*3/uL — ABNORMAL LOW (ref 4.0–10.5)
nRBC: 0 % (ref 0.0–0.2)

## 2019-10-14 LAB — IRON AND TIBC
Iron: 63 ug/dL (ref 28–170)
Saturation Ratios: 24 % (ref 10.4–31.8)
TIBC: 263 ug/dL (ref 250–450)
UIBC: 200 ug/dL

## 2019-10-14 LAB — BPAM FFP
Blood Product Expiration Date: 202101262359
ISSUE DATE / TIME: 202101212230
Unit Type and Rh: 6200

## 2019-10-14 LAB — PREPARE PLATELET PHERESIS
Unit division: 0
Unit division: 0

## 2019-10-14 LAB — URINE CULTURE

## 2019-10-14 LAB — LACTIC ACID, PLASMA: Lactic Acid, Venous: 1.5 mmol/L (ref 0.5–1.9)

## 2019-10-14 LAB — PROCALCITONIN: Procalcitonin: 13.54 ng/mL

## 2019-10-14 LAB — FERRITIN: Ferritin: 48 ng/mL (ref 11–307)

## 2019-10-14 MED ORDER — TBO-FILGRASTIM 480 MCG/0.8ML ~~LOC~~ SOSY
480.0000 ug | PREFILLED_SYRINGE | Freq: Every day | SUBCUTANEOUS | Status: DC
  Administered 2019-10-14 – 2019-10-16 (×3): 480 ug via SUBCUTANEOUS
  Filled 2019-10-14 (×4): qty 0.8

## 2019-10-14 MED ORDER — SODIUM CHLORIDE 0.9 % IV SOLN
510.0000 mg | Freq: Once | INTRAVENOUS | Status: AC
  Administered 2019-10-14: 510 mg via INTRAVENOUS
  Filled 2019-10-14: qty 17

## 2019-10-14 MED ORDER — SODIUM CHLORIDE 0.9% IV SOLUTION: Freq: Once | INTRAVENOUS | Status: AC

## 2019-10-14 MED ORDER — PANTOPRAZOLE SODIUM 40 MG IV SOLR
40.0000 mg | Freq: Two times a day (BID) | INTRAVENOUS | Status: DC
  Administered 2019-10-14 – 2019-10-17 (×7): 40 mg via INTRAVENOUS
  Filled 2019-10-14 (×7): qty 40

## 2019-10-14 MED ORDER — LIDOCAINE 5 % EX OINT
TOPICAL_OINTMENT | Freq: Three times a day (TID) | CUTANEOUS | Status: DC | PRN
  Filled 2019-10-14: qty 35.44

## 2019-10-14 NOTE — Plan of Care (Signed)
  Problem: Education: Goal: Ability to identify signs and symptoms of gastrointestinal bleeding will improve Outcome: Not Progressing   Problem: Bowel/Gastric: Goal: Will show no signs and symptoms of gastrointestinal bleeding Outcome: Not Progressing   Problem: Fluid Volume: Goal: Will show no signs and symptoms of excessive bleeding Outcome: Not Progressing

## 2019-10-14 NOTE — Consult Note (Addendum)
[place]  [phone] [phone]    HEMATOLOGY CONSULTATION  Referring MD:  Dr. 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  She is treated with IV iron as needed.  She was last seen in our office on 08/15/2019.  She presented to the ER with worsening confusion, decreased oral intake, and progressive weakness.  In the ER, she was febrile with a temp of 100.8 and CBC showed a white blood cell count of 2.5, hemoglobin 10.2, and platelet count 26,000.  Lactic acid was elevated at 4.0.  COVID-19 testing negative.  Chest x-ray showed a right lower base density.  She is currently receiving IV antibiotics consisting of ceftriaxone and azithromycin.  She has been having some episodes of frank rectal bleeding during her hospitalization.  Labs from this morning show a white blood cell count of 1.3, hemoglobin 8.5, lately count of 30,000, ANC of 0.7.  This admission, she has received 2 units PRBCs on 1/21/thousand 21, 1 unit FFP on 10/13/2019, and 2 units of platelets on 10/13/2019.  No fevers documented over the past 24 hours.  GI is following due to her rectal bleeding and cirrhosis.  They have recommended transfusing 2 units of platelets today.  She is not a candidate for endoscopy at this time due to severe thrombocytopenia.  When seen today, the patient reports that she is feeling fine.  She is really unable to provide any additional review of systems.  When asked about  rectal bleeding, she cannot recall any recent bleeding however she has had multiple episodes of rectal bleeding since yesterday documented by nursing.  She denies having any pain.  No family at the bedside.  Hematology was asked see the patient today recommendations regarding her pancytopenia.   Past Medical History:  Diagnosis Date  . Anemia, iron deficiency   . Arthritis   . Bladder cancer [facility]) 2003   Bladder removed - [name]  . Cataract immature   . Cirrhosis ([place]) 08/26/2011  . Colon cancer [facility]) 2001   Hemicolectomy - [name]  . Depression   . GERD (gastroesophageal reflux disease)   . Headache(784.0)   . Hemorrhoids   . History of colon polyps   . History of DVT (deep vein thrombosis) 50+yrs ago   With pregnancy  . History of GI bleed   . History of kidney stones   . History of urostomy    Urostomy d/t hx bladder cancer  . Hx of transfusion of whole blood   . Hypertension   . Joint pain   . Memory loss    Chemotherapy; short and long term  . Osteoporosis   . Thrombocytopenia ([place])   :    Past Surgical History:  Procedure Laterality Date  . ABDOMINAL HYSTERECTOMY    . APPENDECTOMY    . BLADDER SURGERY     bladder removed d/t cancer  . CHOLECYSTECTOMY    . COLON RESECTION     with colostomy then colostomy reversal  . COLON SURGERY  2004  . COLON SURGERY  2001  .  COLONOSCOPY    . ESOPHAGOGASTRODUODENOSCOPY  02/27/2012   Procedure: ESOPHAGOGASTRODUODENOSCOPY (EGD);  Surgeon: [name], MD;  Location: [name];  Service: Endoscopy;  Laterality: N/A;  bedside case  . NEPHROLITHOTOMY Left 05/04/2014   Procedure: LEFT PERCUTANEOUS NEPHROLITHOTOMY ;  Surgeon: [name], MD;  Location: [facility];  Service: Urology;  Laterality: Left;  . TOTAL KNEE ARTHROPLASTY  2010   Left knee  :   CURRENT MEDS: Current Facility-Administered Medications  Medication Dose Route Frequency Provider Last Rate Last Admin  . acetaminophen (TYLENOL) tablet 650 mg  650 mg Oral  Q6H PRN [place] Donnamarie Poag, DO       Or  . acetaminophen (TYLENOL) suppository 650 mg  650 mg Rectal Q6H PRN [place] Eric J, DO      . azithromycin (ZITHROMAX) 500 mg in sodium chloride 0.9 % 250 mL IVPB  500 mg Intravenous Q24H [place] Eric J, DO 250 mL/hr at 10/13/19 1410 500 mg at 10/13/19 1410  . cefTRIAXone (ROCEPHIN) 2 g in sodium chloride 0.9 % 100 mL IVPB  2 g Intravenous Q24H [place] Donnamarie Poag, DO 200 mL/hr at 10/13/19 1332 2 g at 10/13/19 1332  . chlorhexidine (PERIDEX) 0.12 % solution 15 mL  15 mL Mouth Rinse BID Alma Friendly, MD   15 mL at 10/13/19 2250  . Chlorhexidine Gluconate Cloth 2 % PADS 6 each  6 each Topical Daily Alma Friendly, MD   6 each at 10/13/19 1442  . citalopram (CELEXA) tablet 20 mg  20 mg Oral Daily Alma Friendly, MD   20 mg at 10/13/19 1512  . lactulose (CHRONULAC) 10 GM/15ML solution 10 g  10 g Oral BID Otis Brace, MD   Stopped at 10/13/19 2250  . lip balm (CARMEX) ointment   Topical PRN Alma Friendly, MD      . MEDLINE mouth rinse  15 mL Mouth Rinse q12n4p Alma Friendly, MD   15 mL at 10/13/19 1659  . octreotide (SANDOSTATIN) 500 mcg in sodium chloride 0.9 % 250 mL (2 mcg/mL) infusion  50 mcg/hr Intravenous Continuous Lang Snow, FNP 25 mL/hr at 10/14/19 0018 50 mcg/hr at 10/14/19 0018  . ondansetron (ZOFRAN) tablet 4 mg  4 mg Oral Q6H PRN [place] Donnamarie Poag, DO       Or  . ondansetron [facility]) injection 4 mg  4 mg Intravenous Q6H PRN [place] Eric J, DO      . pantoprazole (PROTONIX) EC tablet 40 mg  40 mg Oral Daily [place] Donnamarie Poag, DO   40 mg at 10/12/19 2215  . senna-docusate (Senokot-S) tablet 1 tablet  1 tablet Oral QHS PRN [place] Eric J, DO          Allergies  Allergen Reactions  . Bee Venom Anaphylaxis  . Codeine Anaphylaxis and Rash  . Morphine And Related Anaphylaxis and Rash  . Phytonadione Other (See Comments)    Pt experienced an episode of cyanosis following dose of Vitamin K 10 mg IV.  Marland Kitchen  Penicillins Rash and Other (See Comments)    Has patient had a PCN reaction causing immediate rash, facial/tongue/throat swelling, SOB or lightheadedness with hypotension: No Has patient had a PCN reaction causing severe rash involving mucus membranes or skin necrosis: No Has patient had a PCN reaction that required hospitalization: No Has patient had a PCN reaction occurring within the last 10 years: No If all of the above answers are "NO", then may proceed with Cephalosporin use.  . Promethazine  Hcl Other (See Comments)    Reaction:  Unknown   :  Family History  Problem Relation Age of Onset  . Diabetes Brother   . Cancer Other   . Hypertension Other   :  Social History   Socioeconomic History  . Marital status: Married    Spouse name: Not on file  . Number of children: Not on file  . Years of education: Not on file  . Highest education level: Not on file  Occupational History  . Not on file  Tobacco Use  . Smoking status: Former Smoker    Packs/day: 0.50    Years: 35.00    Pack years: 17.50    Types: Cigarettes    Start date: 10/25/1955    Quit date: 09/22/1991    Years since quitting: 28.0  . Smokeless tobacco: Never Used  . Tobacco comment: quit 23years ago  Substance and Sexual Activity  . Alcohol use: No    Alcohol/week: 0.0 standard drinks  . Drug use: No  . Sexual activity: Yes    Birth control/protection: Surgical  Other Topics Concern  . Not on file  Social History Narrative   Lives at home with her husband, ambulatory   Social Determinants of Health   Financial Resource Strain:   . Difficulty of Paying Living Expenses: Not on file  Food Insecurity:   . Worried About Charity fundraiser in the Last Year: Not on file  . Ran Out of Food in the Last Year: Not on file  Transportation Needs:   . Lack of Transportation (Medical): Not on file  . Lack of Transportation (Non-Medical): Not on file  Physical Activity:   . Days of Exercise per Week: Not on  file  . Minutes of Exercise per Session: Not on file  Stress:   . Feeling of Stress : Not on file  Social Connections:   . Frequency of Communication with Friends and Family: Not on file  . Frequency of Social Gatherings with Friends and Family: Not on file  . Attends Religious Services: Not on file  . Active Member of Clubs or Organizations: Not on file  . Attends Archivist Meetings: Not on file  . Marital Status: Not on file  Intimate Partner Violence:   . Fear of Current or Ex-Partner: Not on file  . Emotionally Abused: Not on file  . Physically Abused: Not on file  . Sexually Abused: Not on file  :  REVIEW OF SYSTEMS: Unable to obtain review of systems secondary to patient's dementia.  Exam: Patient Vitals for the past 24 hrs:  BP Temp Temp src Pulse Resp SpO2  10/14/19 0800 -- (!) 97.4 F (36.3 C) -- -- -- --  10/14/19 0044 (!) 134/49 98.1 F (36.7 C) Oral 60 19 96 %  10/13/19 2303 128/62 98.2 F (36.8 C) Oral (!) 52 16 --  10/13/19 2300 103/74 -- -- (!) 55 19 99 %  10/13/19 2235 (!) 141/75 98 F (36.7 C) Oral 61 13 92 %  10/13/19 2145 (!) 129/59 98.1 F (36.7 C) Oral (!) 56 16 98 %  10/13/19 2137 110/67 -- -- (!) 56 20 97 %  10/13/19 2027 (!) 132/55 98 F (36.7 C) Oral (!) 57 18 99 %  10/13/19 2015 -- -- -- (!) 55 -- --  10/13/19 2012 (!) 114/55 98.3 F (36.8 C) Oral (!) 52 20 --  10/13/19 2000 -- -- -- (!) 58 -- --  10/13/19 1959 (!) 114/55  98.3 F (36.8 C) Oral (!) 54 14 --  10/13/19 1945 -- -- -- (!) 57 (!) 21 99 %  10/13/19 1827 (!) 126/42 -- -- (!) 53 14 100 %  10/13/19 1744 (!) 106/51 98.4 F (36.9 C) Oral (!) 52 17 100 %  10/13/19 1724 (!) 112/35 98.4 F (36.9 C) Oral (!) 52 19 99 %  10/13/19 1708 (!) 105/46 98.1 F (36.7 C) Oral (!) 51 19 99 %  10/13/19 1700 (!) 108/44 -- -- (!) 51 13 100 %  10/13/19 1517 (!) 98/40 99 F (37.2 C) Oral (!) 50 18 --  10/13/19 1502 (!) 113/40 -- -- (!) 51 20 100 %  10/13/19 1501 (!) 113/40 98.5 F (36.9 C)  Oral -- -- --  10/13/19 1445 (!) 127/59 (!) 97.5 F (36.4 C) Oral (!) 53 14 100 %  10/13/19 1149 (!) 109/48 98.9 F (37.2 C) Oral (!) 53 19 99 %  10/13/19 1115 (!) 115/59 98.1 F (36.7 C) Oral (!) 53 (!) 26 100 %  10/13/19 0927 (!) 106/48 -- -- (!) 55 -- 100 %  10/13/19 0904 (!) 96/54 99.2 F (37.3 C) Axillary (!) 53 18 99 %    General: Elderly female, resting comfortably in bed, no distress Eyes:  no scleral icterus.   ENT:  There were no oropharyngeal lesions.     Lymphatics:  Negative cervical, supraclavicular or axillary adenopathy.   Respiratory: Diminished breath sounds bilaterally.   Cardiovascular:  Regular rate and rhythm, S1/S2, without murmur, rub or gallop.  There was no pedal edema.   GI: Soft, nontender, positive bowel sounds, urostomy in place.   Musculoskeletal: Moves all extremities x4.   Skin exam was without ecchymosis, petechiae.   Neuro: The patient is alert.  Oriented to person only.  No focal neuro deficits noted.  LABS:  Lab Results  Component Value Date   WBC 1.3 (LL) 10/14/2019   HGB 8.5 (L) 10/14/2019   HCT 25.9 (L) 10/14/2019   PLT 30 (L) 10/14/2019   GLUCOSE 125 (H) 10/14/2019   CHOL 118 05/17/2019   TRIG 66 05/17/2019   HDL 36 (L) 05/17/2019   LDLCALC 67 05/17/2019   ALT 23 10/14/2019   AST 41 10/14/2019   NA 143 10/14/2019   K 4.5 10/14/2019   CL 115 (H) 10/14/2019   CREATININE 0.90 10/14/2019   BUN 42 (H) 10/14/2019   CO2 22 10/14/2019   INR 1.8 (H) 10/14/2019   HGBA1C 4.9 05/26/2017    DG Chest Port 1 View  Result Date: 10/12/2019 CLINICAL DATA:  Sepsis EXAM: PORTABLE CHEST 1 VIEW COMPARISON:  12/16/2018 FINDINGS: Cardiac enlargement without heart failure.  Left lung clear. Wispy density in the right lung base appears to have progressed in the interval. No effusion. IMPRESSION: Wispy density in the right lung base may represent atelectasis, scarring or possibly pneumonia. Follow-up recommended. Electronically Signed   By: Franchot Gallo M.D.   On: 10/12/2019 10:47    ASSESSMENT AND PLAN:  1.  Pancytopenia 2.  Sepsis secondary to ? UTI/community-acquired pneumonia 3.  GI bleed 4.  NASH/cirrhosis 5.  Splenomegaly 6.  Stage IV bladder cancer 7.  Remote history of colon adenocarcinoma 8.  Dementia  -The patient has a longstanding history of pancytopenia.  Leukopenia and thrombocytopenia likely due to her underlying liver disease and splenomegaly.  Anemia is due to iron deficiency.  Pancytopenia has worsened since hospitalization.  This is likely due to a combination of her liver disease, splenomegaly,  sepsis/acute infection, and active GI bleeding.  Will add on ferritin and iron studies and administer IV iron if she is found to be deficient.  Agree with platelet transfusion today given that she is actively bleeding.  Would recommend PRBC transfusion if her hemoglobin is less than 8 in the presence of GI bleed. -Continue IV antibiotics for her infections per hospitalist. -Appreciate GI assistance with management of her GI bleed and liver disease.  Thank you for this referral.  Mikey Bussing, DNP, AGPCNP-BC, AOCNP  ADDENDUM: I saw Ms. Poma this morning.  I just really feel bad for her.  I have known her for almost 20 years.  She has beaten 2 cancers.  Unfortunately, the dementia and the NASH cirrhosis are going to be what will eventually take your to the [facility].  Her cytopenias are clearly from the cirrhosis.  I do not see that she needs a bone marrow test.  Her coagulation issues are also from her cirrhosis.  I probably would put her on some Neupogen to try to get her white cell count up if possible.  I think that if she does need to be endoscope, this could be done.  I will do your platelet count is that much of a deterrent.  Even though her platelets are low, her platelets are still relatively functional.  I know that she is getting fantastic care by all the staff in the ICU.  I will have to call her husband  and see how he is doing.  We will follow along and try to help out in any way that we can.  I suspect that she probably will need some IV iron.  I think transfusion support is probably going to be the best way to help with her platelets if she does have bleeding.  The FFP seems to correct her coagulopathy.  Her INR has pretty much normalized.  It was 1.8 this morning which is not that bad.  I totally agree with the DO NOT RESUSCITATE status.  We will continue to pray hard for her.  She has always had a strong faith.  Erica Haw, MD  [place] [facility] 6:2

## 2019-10-14 NOTE — Progress Notes (Addendum)
SLP Cancellation Note  Patient Details Name: [name] MRN: [id number] DOB: 28-Jun-1941   Cancelled treatment:       Reason Eval/Treat Not Completed: Other (comment)(pt remains npo, GI bleed.  SLP will sign off at this time. Please reorder if desire.     Macario Golds 10/14/2019, 7:30 AM  Kathleen Lime, MS [place] Office [phone]

## 2019-10-14 NOTE — Evaluation (Signed)
Physical Therapy Evaluation Patient Details Name: [name] MRN: [id number] DOB: 04-24-1941 Today's Date: 10/14/2019   History of Present Illness  79 y o female was admitted with rectal bleeding and acute respiratory failure with hypoxia.  PMH:  Advanced dementia.  colon and bladder CA, ileostomy, and non alcoholic cirrohosis  Clinical Impression  Pt admitted as above and presenting with functional mobility limitations 2* generalized weakness, balance deficits, poor endurance and cognitive deficits related to dementia.  Pt and spouse hope to progress to return to previous living arrangement.    Follow Up Recommendations Home health PT    Equipment Recommendations  None recommended by PT    Recommendations for Other Services       Precautions / Restrictions Precautions Precautions: Fall Precaution Comments: dementia, rectal bleed, watch vitals Restrictions Weight Bearing Restrictions: No      Mobility  Bed Mobility Overal bed mobility: Needs Assistance Bed Mobility: Rolling;Supine to Sit;Sit to Supine Rolling: Mod assist;+2 for physical assistance   Supine to sit: Mod assist;+2 for physical assistance Sit to supine: Mod assist;+2 for physical assistance   General bed mobility comments: pt fearful of falling when rolling  Transfers Overall transfer level: Needs assistance Equipment used: Rolling walker (2 wheeled) Transfers: Sit to/from Stand Sit to Stand: Mod assist;+2 physical assistance         General transfer comment: for sit to stand.  Min +2 for safety to sidestep up [facility]  Ambulation/Gait Ambulation/Gait assistance: Min assist;Mod assist;+2 physical assistance;+2 safety/equipment Gait Distance (Feet): 4 Feet Assistive device: Rolling walker (2 wheeled) Gait Pattern/deviations: Step-to pattern;Decreased step length - right;Decreased step length - left;Shuffle     General Gait Details: pt side stepped up side of bed only with RW and assist  Stairs             Wheelchair Mobility    Modified Rankin (Stroke Patients Only)       Balance Overall balance assessment: Needs assistance Sitting-balance support: Bilateral upper extremity supported;Feet supported Sitting balance-Leahy Scale: Fair     Standing balance support: Bilateral upper extremity supported Standing balance-Leahy Scale: Poor                               Pertinent Vitals/Pain Pain Assessment: Faces Faces Pain Scale: No hurt    Home Living Family/patient expects to be discharged to:: Private residence Living Arrangements: Spouse/significant other Available Help at Discharge: Family;Personal care attendant Type of Home: House Home Access: Ramped entrance     Home Layout: Able to live on main level with bedroom/bathroom Home Equipment: Walker - 4 wheels;Bedside commode Additional Comments: spouse provided hx    Prior Function Level of Independence: Needs assistance   Gait / Transfers Assistance Needed: ambulated with RW  ADL's / Homemaking Assistance Needed: assist for bathing and dressing. Pt walks into bathroom for toileting  3:1 over toilet        Hand Dominance   Dominant Hand: Left    Extremity/Trunk Assessment   Upper Extremity Assessment Upper Extremity Assessment: Generalized weakness            Communication   Communication: No difficulties  Cognition Arousal/Alertness: Awake/alert Behavior During Therapy: WFL for tasks assessed/performed Overall Cognitive Status: History of cognitive impairments - at baseline  General Comments: follows commands, sometimes needs multimodal cues      General Comments General comments (skin integrity, edema, etc.): VSS. BP sitting was 120/72; back to bed 109/55.  Did not get standing bp--no signs of dizziness.  Sats in high 80s and mid 90s. HR 49-65    Exercises     Assessment/Plan    PT Assessment Patient needs continued PT  services  PT Problem List Decreased strength;Decreased activity tolerance;Decreased balance;Decreased mobility;Decreased cognition;Decreased knowledge of use of DME       PT Treatment Interventions DME instruction;Gait training;Functional mobility training;Therapeutic activities;Therapeutic exercise;Patient/family education;Balance training    PT Goals (Current goals can be found in the Care Plan section)  Acute Rehab PT Goals Patient Stated Goal: Husband's:  find out what is wrong PT Goal Formulation: With patient Time For Goal Achievement: 10/28/19 Potential to Achieve Goals: Fair    Frequency Min 3X/week   Barriers to discharge        Co-evaluation PT/OT/SLP Co-Evaluation/Treatment: Yes Reason for Co-Treatment: For patient/therapist safety PT goals addressed during session: Mobility/safety with mobility OT goals addressed during session: ADL's and self-care       AM-PAC PT "6 Clicks" Mobility  Outcome Measure Help needed turning from your back to your side while in a flat bed without using bedrails?: A Lot Help needed moving from lying on your back to sitting on the side of a flat bed without using bedrails?: A Lot Help needed moving to and from a bed to a chair (including a wheelchair)?: A Lot Help needed standing up from a chair using your arms (e.g., wheelchair or bedside chair)?: A Lot Help needed to walk in hospital room?: A Lot Help needed climbing 3-5 steps with a railing? : A Lot 6 Click Score: 12    End of Session Equipment Utilized During Treatment: Gait belt Activity Tolerance: Patient tolerated treatment well;Patient limited by fatigue Patient left: in bed;with call bell/phone within reach;with bed alarm set;with family/visitor present Nurse Communication: Mobility status PT Visit Diagnosis: Muscle weakness (generalized) (M62.81);Difficulty in walking, not elsewhere classified (R26.2)    Time: 6734-1937 PT Time Calculation (min) (ACUTE ONLY): 33  min   Charges:   PT Evaluation $PT Eval Moderate Complexity: 1 Mod          [place] Pager [phone] Office [phone]   Shanti Eichel 10/14/2019, 1:03 PM

## 2019-10-14 NOTE — Progress Notes (Signed)
[facility] Gastroenterology Progress Note  [name] 79 y.o. January 02, 1941  CC: GI bleed   Subjective: Patient seen and examined at bedside.  Resting comfortably.  Discussed with RN.  Multiple episodes of rectal bleeding since yesterday.  ROS : Not obtained   Objective: Vital signs in last 24 hours: Vitals:   10/14/19 0044 10/14/19 0800  BP: (!) 134/49   Pulse: 60   Resp: 19   Temp: 98.1 F (36.7 C) (!) 97.4 F (36.3 C)  SpO2: 96%     Physical Exam:  General.  Elderly patient, resting comfortably Abdomen.  Soft, nontender, nondistended, bowel sounds present.  No peritoneal signs Rectal exam.  Large external hemorrhoids.  Dark maroon-colored blood around perianal area as well as on the finger.  No rectal mass.  Rectal exam was performed with RN being chaperone. Psych.  Resting.  Not agitated.  Lab Results: Recent Labs    10/13/19 0600 10/14/19 0134  NA 139 143  K 4.9 4.5  CL 110 115*  CO2 19* 22  GLUCOSE 126* 125*  BUN 36* 42*  CREATININE 1.06* 0.90  CALCIUM 9.1 9.0   Recent Labs    10/12/19 1012 10/14/19 0134  AST 29 41  ALT 18 23  ALKPHOS 64 43  BILITOT 2.9* 1.8*  PROT 5.5* 4.8*  ALBUMIN 2.9* 2.4*   Recent Labs    10/12/19 1012 10/13/19 0600 10/13/19 2218 10/14/19 0134  WBC 2.5*   < > 1.6* 1.3*  NEUTROABS 2.2  --   --  0.7*  HGB 10.2*   < > 8.9* 8.5*  HCT 31.5*   < > 27.4* 25.9*  MCV 96.9   < > 97.5 96.6  PLT 26*   < > 32* 30*   < > = values in this interval not displayed.   Recent Labs    10/12/19 1012 10/14/19 0134  LABPROT 21.2* 20.4*  INR 1.8* 1.8*      Assessment/Plan: -Rectal bleeding with maroon-colored stool in setting of severe thrombocytopenia with platelet counts of 22 on admission. -Pancytopenia. - cirrhosis of the liver.  Most likely [place] related. -Confusion.  Could be from underlying infection such as pneumonia or could be from liver disease. -Remote history of colon cancer -History of bladder cancer status post  urostomy  Recommendations ------------------------ -Transfused 2 more units of platelets.  Not a candidate for endoscopy because of severe thrombocytopenia -Repeat CBC tomorrow -Continue octreotide, Protonix and antibiotics -Continue lactulose.  Hold for diarrhea -Continue supportive care -GI will follow    Otis Brace MD, [place] 10/14/2019, 9:12 AM  Contact #  [phone]

## 2019-10-14 NOTE — Progress Notes (Addendum)
PROGRESS NOTE  [name] MGN:[id number] DOB: 27-Dec-1940 DOA: 10/12/2019 PCP: [name], MD  HPI/Recap of past 24 hours: HPI from Dr [place] [name] is a 79 y.o. female with medical history significant of advanced dementia, depression, essential hypertension, GERD, and bladder cancer status post ostomy who presented to the ED via EMS for worsening confusion, decreased oral intake and progressive weakness. Patient with history advanced dementia and husband at bedside gives majority of the HPI. Husband reports that her dementia has slowly advanced over the last 2 years, but at baseline she is able to ambulate with the use of a walker.  Currently lives at home with husband with occasional outside support. In the ED, VS showed: Temp of  100.8, HR 63, RR 20, BP 99/88, SPO2 95% on 2 L nasal cannula.  WBC count 2.5, hemoglobin 10.2, platelet count 26, BUN 29, creatinine 0.90, Lactic acid 4.0. Covid-19/SARS-CoV-2 PCR negative.  Rapid influenza negative.  INR 1.8.  Chest x-ray with right lower base density.  Patient was given a 2 L NS bolus with azithromycin and ceftriaxone.  ED physician referred for admission for acute hypoxic respiratory failure/sepsis likely secondary to community-acquired pneumonia.     Patient continues to have rectal bleeding, multiple episodes.  Denies any significant pain, appears somewhat comfortable.  ROS limited by dementia   Assessment/Plan: Principal Problem:   Acute respiratory failure with hypoxia ([place]) Active Problems:   Bladder cancer (HCC)   Thrombocytopenia (HCC)   Dementia without behavioral disturbance ([place])   Sepsis ([place])   Essential hypertension   Community acquired pneumonia   Acute hypoxic respiratory failure likely 2/2 CAP Sepsis, present on admission, ??UTI Afebrile, leukopenia (chronic) Lactic acid of 4.0-->2.4-->1.5 BC X 2 NGTD Procalcitonin elevated 17--> 19-->13 Covid-19/SARS-CoV-2 PCR negative.  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 daily  GERD Continue PPI  Hx bladder cancer status post urostomy Wound ostomy consult for continue management of urostomy while inpatient  Remote history of stage II adenocarcinoma of the colon  GOC Very poor prognosis Palliative care consulted for possible ?hospice         Malnutrition Type:  Nutrition Problem: Inadequate oral intake Etiology: lethargy/confusion   Malnutrition Characteristics:  Signs/Symptoms: per patient/family report   Nutrition Interventions:  Interventions: Refer to RD note for recommendations    Estimated body mass index is 28.34 kg/m as calculated from the following:   Height as of this encounter: 5' 3"  (1.6 m).   Weight as of this encounter: 72.6 kg.     Code Status: DNR  Family Communication: Attempted to reach spouse on 10/13/2019, unavailable  Disposition Plan: Likely home, versus  SNF  Consultants:  GI  Heme-onc  Procedures:  None  Antimicrobials:  Azithromycin  Ceftriaxone  DVT prophylaxis: SCDs   Objective: Vitals:   10/14/19 1200 10/14/19 1315 10/14/19 1330 10/14/19 1345  BP:  96/74  115/61  Pulse:  (!) 49 (!) 50 (!) 53  Resp:  19 (!) 25 17  Temp: 97.8 F (36.6 C) 97.8 F (36.6 C)  98.1 F (36.7 C)  TempSrc:  Oral  Oral  SpO2:  100% 99% 100%  Weight:      Height:        Intake/Output Summary (Last 24 hours) at 10/14/2019 1511 Last data filed at 10/14/2019 1345 Gross per 24 hour  Intake 2238.2 ml  Output 775 ml  Net 1463.2 ml   Filed Weights   10/12/19 0946  Weight: 72.6 kg    Exam:  General: NAD, chronically ill-appearing  Cardiovascular: S1, S2 present  Respiratory:  Diminished breath sounds bilaterally  Abdomen: Soft, nontender, nondistended, bowel sounds present, urostomy noted  Musculoskeletal: No bilateral pedal edema noted  Skin: Normal  Psychiatry: Poor judgment and insight   Data Reviewed: CBC: Recent Labs  Lab 10/12/19 1012 10/13/19 0600 10/13/19 1004 10/13/19 2218 10/14/19 0134  WBC 2.5* 1.5* 1.6* 1.6* 1.3*  NEUTROABS 2.2  --   --   --  0.7*  HGB 10.2* 8.1* 7.9* 8.9* 8.5*  HCT 31.5* 25.2* 24.8* 27.4* 25.9*  MCV 96.9 97.3 98.0 97.5 96.6  PLT 26* 22* 23* 32* 30*   Basic Metabolic Panel: Recent Labs  Lab 10/12/19 1012 10/13/19 0600 10/14/19 0134  NA 140 139 143  K 4.0 4.9 4.5  CL 108 110 115*  CO2 24 19* 22  GLUCOSE 117* 126* 125*  BUN 29* 36* 42*  CREATININE 0.90 1.06* 0.90  CALCIUM 10.3 9.1 9.0   GFR: Estimated Creatinine Clearance: 47.6 mL/min (by C-G formula based on SCr of 0.9 mg/dL). Liver Function Tests: Recent Labs  Lab 10/12/19 1012 10/14/19 0134  AST 29 41  ALT 18 23  ALKPHOS 64 43  BILITOT 2.9* 1.8*  PROT 5.5* 4.8*  ALBUMIN 2.9* 2.4*   No results for input(s): LIPASE, AMYLASE in the last 168 hours. No results for input(s): AMMONIA in the last 168  hours. Coagulation Profile: Recent Labs  Lab 10/12/19 1012 10/14/19 0134  INR 1.8* 1.8*   Cardiac Enzymes: No results for input(s): CKTOTAL, CKMB, CKMBINDEX, TROPONINI in the last 168 hours. BNP (last 3 results) No results for input(s): PROBNP in the last 8760 hours. HbA1C: No results for input(s): HGBA1C in the last 72 hours. CBG: No results for input(s): GLUCAP in the last 168 hours. Lipid Profile: No results for input(s): CHOL, HDL, LDLCALC, TRIG, CHOLHDL, LDLDIRECT in the last 72 hours. Thyroid Function Tests: Recent Labs    10/12/19 1905  TSH 0.482   Anemia Panel: Recent Labs    10/14/19 1131  FERRITIN 48  TIBC 263  IRON 63   Urine analysis:    Component Value Date/Time   COLORURINE AMBER (A) 10/12/2019 1218   APPEARANCEUR CLOUDY (A) 10/12/2019 1218   LABSPEC 1.015 10/12/2019 1218   PHURINE 7.0 10/12/2019 [place] 10/12/2019 [place] 10/12/2019 [place] 10/12/2019 [place] 10/12/2019 1218   PROTEINUR 100 (A) 10/12/2019 1218   UROBILINOGEN 1.0 04/06/2014 1856   NITRITE NEGATIVE 10/12/2019 1218   LEUKOCYTESUR TRACE (A) 10/12/2019 1218   Sepsis Labs: @LABRCNTIP (procalcitonin:4,lacticidven:4)  ) Recent Results (from the past 240  hour(s))  Culture, blood (Routine x 2)     Status: None (Preliminary result)   Collection Time: 10/12/19 10:12 AM   Specimen: BLOOD  Result Value Ref Range Status   Specimen Description   Final    BLOOD BLOOD LEFT FOREARM Performed at [facility] [address], [place], [place] [address]    Special Requests   Final    BOTTLES DRAWN AEROBIC AND ANAEROBIC Blood Culture adequate volume Performed at [facility] [address], [place], [place] [address]    Culture   Final    NO GROWTH 2 DAYS Performed at [facility] [address] [place], [place] [address]    Report Status PENDING  Incomplete  Respiratory  Panel by RT PCR (Flu A&B, Covid) - Nasopharyngeal Swab     Status: None   Collection Time: 10/12/19 10:12 AM   Specimen: Nasopharyngeal Swab  Result Value Ref Range Status   SARS Coronavirus 2 by RT PCR NEGATIVE NEGATIVE Final    Comment: (NOTE) SARS-CoV-2 target nucleic acids are NOT DETECTED. The SARS-CoV-2 RNA is generally detectable in upper respiratoy specimens during the acute phase of infection. The lowest concentration of SARS-CoV-2 viral copies this assay can detect is 131 copies/mL. A negative result does not preclude SARS-Cov-2 infection and should not be used as the sole basis for treatment or other patient management decisions. A negative result may occur with  improper specimen collection/handling, submission of specimen other than nasopharyngeal swab, presence of viral mutation(s) within the areas targeted by this assay, and inadequate number of viral copies (<131 copies/mL). A negative result must be combined with clinical observations, patient history, and epidemiological information. The expected result is Negative. Fact Sheet for Patients:  PinkCheek.be Fact Sheet for Healthcare Providers:  GravelBags.it This test is not yet ap proved or cleared by the [place] FDA and  has been authorized for detection and/or diagnosis of SARS-CoV-2 by FDA under an Emergency Use Authorization (EUA). This EUA will remain  in effect (meaning this test can be used) for the duration of the COVID-19 declaration under Section 564(b)(1) of the Act, 21 U.S.C. section 360bbb-3(b)(1), unless the authorization is terminated or revoked sooner.    Influenza A by PCR NEGATIVE NEGATIVE Final   Influenza B by PCR NEGATIVE NEGATIVE Final    Comment: (NOTE) The Xpert Xpress SARS-CoV-2/FLU/RSV assay is intended as an aid in  the diagnosis of influenza from Nasopharyngeal swab specimens and  should not be used as a sole basis for  treatment. Nasal washings and  aspirates are unacceptable for Xpert Xpress SARS-CoV-2/FLU/RSV  testing. Fact Sheet for Patients: PinkCheek.be Fact Sheet for Healthcare Providers: GravelBags.it This test is not yet approved or cleared by the [place] FDA and  has been authorized for detection and/or diagnosis of SARS-CoV-2 by  FDA under an Emergency Use Authorization (EUA). This EUA will remain  in effect (meaning this test can be used) for the duration of the  Covid-19 declaration under Section 564(b)(1) of the Act, 21  U.S.C. section 360bbb-3(b)(1), unless the authorization is  terminated or revoked. Performed at [facility], [place] [address] [place], [place] [address]   Urine culture     Status: Abnormal   Collection Time: 10/12/19 12:18 PM   Specimen: Urine, Catheterized  Result Value Ref Range Status   Specimen Description   Final    URINE, CATHETERIZED Performed at [facility] [address], [place], [place] [address]  Special Requests   Final    NONE Performed at [facility], [place] [address] [place], [place] [address]    Culture MULTIPLE SPECIES PRESENT, SUGGEST RECOLLECTION (A)  Final   Report Status 10/14/2019 FINAL  Final  Culture, blood (Routine x 2)     Status: None (Preliminary result)   Collection Time: 10/12/19  7:05 PM   Specimen: BLOOD RIGHT HAND  Result Value Ref Range Status   Specimen Description   Final    BLOOD RIGHT HAND Performed at [facility] [address], [place], [place] [address]    Special Requests   Final    BOTTLES DRAWN AEROBIC ONLY Blood Culture adequate volume Performed at [facility] [address] [place], [place] [address]    Culture   Final    NO GROWTH 1 DAY Performed at [facility], [place] [address] [place], [place] [address]    Report Status PENDING   Incomplete      Studies: No results found.  Scheduled Meds: . chlorhexidine  15 mL Mouth Rinse BID  . Chlorhexidine Gluconate Cloth  6 each Topical Daily  . citalopram  20 mg Oral Daily  . lactulose  10 g Oral BID  . mouth rinse  15 mL Mouth Rinse q12n4p  . pantoprazole (PROTONIX) IV  40 mg Intravenous Q12H  . Tbo-Filgrastim  480 mcg Subcutaneous q1800    Continuous Infusions: . azithromycin 500 mg (10/14/19 1303)  . cefTRIAXone (ROCEPHIN)  IV Stopped (10/14/19 1301)  . ferumoxytol    . octreotide  (SANDOSTATIN)    IV infusion 50 mcg/hr (10/14/19 1140)     LOS: 2 days     Alma Friendly, MD [facility]  If 7PM-7AM, please contact night-coverage [email or web address] 10/14/2019, 3:11 PM

## 2019-10-14 NOTE — Evaluation (Signed)
Occupational Therapy Evaluation Patient Details Name: [name] MRN: [id number] DOB: 11-22-1940 Today's Date: 10/14/2019    History of Present Illness 79 y o female was admitted with rectal bleeding and acute respiratory failure with hypoxia.  PMH:  Advanced dementia.  colon and bladder CA, ileostomy, and non alcoholic cirrohosis   Clinical Impression   Pt was admitted for the above.  She lives with her husband and he helps her with adls and toileting.  They also have a caregiver who assists.  Pt normally walks to bathroom with RW and assistance.  She was able to sit EOB and stand and sidestep during evaluation. She was limited diarrhea, having rectal bleeding and low platelets today (22). Will follow in acute setting with min A level goals for toileting/precursor for toileting.  She needs mod +2 at this time    Follow Up Recommendations  SNF;Supervision/Assistance - 24 hour(depending on progress)    Equipment Recommendations  None recommended by OT    Recommendations for Other Services       Precautions / Restrictions Precautions Precautions: Fall Precaution Comments: dementia, rectal bleed, watch vitals Restrictions Weight Bearing Restrictions: No      Mobility Bed Mobility Overal bed mobility: Needs Assistance Bed Mobility: Rolling;Supine to Sit;Sit to Supine Rolling: Mod assist;+2 for physical assistance   Supine to sit: Mod assist;+2 for physical assistance Sit to supine: Mod assist;+2 for physical assistance   General bed mobility comments: pt fearful of falling when rolling  Transfers Overall transfer level: Needs assistance Equipment used: Rolling walker (2 wheeled) Transfers: Sit to/from Stand Sit to Stand: Mod assist;+2 physical assistance         General transfer comment: for sit to stand.  Min +2 for safety to sidestep up HOB    Balance Overall balance assessment: Needs assistance   Sitting balance-Leahy Scale: Good       Standing  balance-Leahy Scale: Poor                             ADL either performed or assessed with clinical judgement   ADL Overall ADL's : Needs assistance/impaired   Eating/Feeding Details (indicate cue type and reason): RN clarifying order Grooming: Minimal assistance                                 General ADL Comments: Pt has assistance for adls at baseline.  She lifted arms for changing gown and legs for donning socks:  max A overall.  Rolled several time to change pads and perform hygiene 2* diarrhea     Vision         Perception     Praxis      Pertinent Vitals/Pain Pain Assessment: Faces Faces Pain Scale: No hurt     Hand Dominance     Extremity/Trunk Assessment Upper Extremity Assessment Upper Extremity Assessment: Generalized weakness           Communication Communication Communication: No difficulties   Cognition Arousal/Alertness: Awake/alert Behavior During Therapy: WFL for tasks assessed/performed Overall Cognitive Status: History of cognitive impairments - at baseline                                 General Comments: follows commands, sometimes needs multimodal cues   General Comments  VSS. BP sitting was 120/72; back to  bed 109/55.  Did not get standing bp--no signs of dizziness.  Sats in high 80s and mid 90s. HR 49-65    Exercises     Shoulder Instructions      Home Living Family/patient expects to be discharged to:: Private residence Living Arrangements: Spouse/significant other Available Help at Discharge: Family;Personal care attendant Type of Home: [place] entrance               [place]: Ray: Environmental consultant - 4 wheels;Bedside commode          Prior Functioning/Environment Level of Independence: Needs assistance    ADL's / Homemaking Assistance Needed: assist for bathing and dressing. Pt walks into bathroom for toileting  3:1 over toilet             OT Problem List: Decreased strength;Decreased activity tolerance;Impaired balance (sitting and/or standing);Decreased cognition;Cardiopulmonary status limiting activity      OT Treatment/Interventions: Self-care/ADL training;DME and/or AE instruction;Patient/family education;Balance training;Therapeutic activities;Cognitive remediation/compensation    OT Goals(Current goals can be found in the care plan section) Acute Rehab OT Goals Patient Stated Goal: Husband's:  find out what is wrong OT Goal Formulation: With family Time For Goal Achievement: 10/28/19 Potential to Achieve Goals: Good ADL Goals Pt Will Transfer to Toilet: with min assist;bedside commode;ambulating Additional ADL Goal #1: pt will go from sit to stand with min A for adls and maintain x 2 minutes with min guard Additional ADL Goal #2: pt will perform bed mobility with min A in preparation for toileting  OT Frequency: Min 2X/week   Barriers to D/C:            Co-evaluation PT/OT/SLP Co-Evaluation/Treatment: Yes Reason for Co-Treatment: For patient/therapist safety PT goals addressed during session: Mobility/safety with mobility OT goals addressed during session: ADL's and self-care      AM-PAC OT "6 Clicks" Daily Activity     Outcome Measure Help from another person eating meals?: A Little Help from another person taking care of personal grooming?: A Little Help from another person toileting, which includes using toliet, bedpan, or urinal?: Total Help from another person bathing (including washing, rinsing, drying)?: A Lot Help from another person to put on and taking off regular upper body clothing?: A Lot Help from another person to put on and taking off regular lower body clothing?: Total 6 Click Score: 12   End of Session    Activity Tolerance: Patient tolerated treatment well Patient left: in bed;with call bell/phone within reach;with bed alarm set;with family/visitor present  OT Visit  Diagnosis: Muscle weakness (generalized) (M62.81);Unsteadiness on feet (R26.81)                Time: 6387-5643 OT Time Calculation (min): 43 min Charges:  OT General Charges $OT Visit: 1 Visit OT Evaluation $OT Eval Moderate Complexity: [place], OTR/L Acute Rehabilitation Services 10/14/2019  Caden Fatica 10/14/2019, 12:50 PM

## 2019-10-14 NOTE — Progress Notes (Signed)
CRITICAL VALUE ALERT  Critical Value:  WBC - 1.3  Date & Time Notied:  10/14/2019 @ 0322  Provider Notified: [place]  Orders Received/Actions taken: Awaiting instructions

## 2019-10-15 LAB — CBC WITH DIFFERENTIAL/PLATELET
Abs Immature Granulocytes: 0.03 10*3/uL (ref 0.00–0.07)
Basophils Absolute: 0 10*3/uL (ref 0.0–0.1)
Basophils Relative: 1 %
Eosinophils Absolute: 0.1 10*3/uL (ref 0.0–0.5)
Eosinophils Relative: 2 %
HCT: 28.1 % — ABNORMAL LOW (ref 36.0–46.0)
Hemoglobin: 9.1 g/dL — ABNORMAL LOW (ref 12.0–15.0)
Immature Granulocytes: 1 %
Lymphocytes Relative: 14 %
Lymphs Abs: 0.5 10*3/uL — ABNORMAL LOW (ref 0.7–4.0)
MCH: 31.4 pg (ref 26.0–34.0)
MCHC: 32.4 g/dL (ref 30.0–36.0)
MCV: 96.9 fL (ref 80.0–100.0)
Monocytes Absolute: 0.4 10*3/uL (ref 0.1–1.0)
Monocytes Relative: 13 %
Neutro Abs: 2.3 10*3/uL (ref 1.7–7.7)
Neutrophils Relative %: 69 %
Platelets: 46 10*3/uL — ABNORMAL LOW (ref 150–400)
RBC: 2.9 MIL/uL — ABNORMAL LOW (ref 3.87–5.11)
RDW: 17.2 % — ABNORMAL HIGH (ref 11.5–15.5)
WBC: 3.3 10*3/uL — ABNORMAL LOW (ref 4.0–10.5)
nRBC: 0 % (ref 0.0–0.2)

## 2019-10-15 LAB — BPAM PLATELET PHERESIS
Blood Product Expiration Date: 202101232359
Blood Product Expiration Date: 202101252359
ISSUE DATE / TIME: 202101221332
ISSUE DATE / TIME: 202101221728
Unit Type and Rh: 6200
Unit Type and Rh: 6200

## 2019-10-15 LAB — PREPARE PLATELET PHERESIS
Unit division: 0
Unit division: 0

## 2019-10-15 LAB — COMPREHENSIVE METABOLIC PANEL
ALT: 22 U/L (ref 0–44)
AST: 35 U/L (ref 15–41)
Albumin: 2.6 g/dL — ABNORMAL LOW (ref 3.5–5.0)
Alkaline Phosphatase: 49 U/L (ref 38–126)
Anion gap: 7 (ref 5–15)
BUN: 37 mg/dL — ABNORMAL HIGH (ref 8–23)
CO2: 22 mmol/L (ref 22–32)
Calcium: 9 mg/dL (ref 8.9–10.3)
Chloride: 111 mmol/L (ref 98–111)
Creatinine, Ser: 0.89 mg/dL (ref 0.44–1.00)
GFR calc Af Amer: >60 mL/min (ref >60)
GFR calc non Af Amer: >60 mL/min (ref >60)
Glucose, Bld: 78 mg/dL (ref 70–99)
Potassium: 3.8 mmol/L (ref 3.5–5.1)
Sodium: 140 mmol/L (ref 135–145)
Total Bilirubin: 1.5 mg/dL — ABNORMAL HIGH (ref 0.3–1.2)
Total Protein: 4.9 g/dL — ABNORMAL LOW (ref 6.5–8.1)

## 2019-10-15 LAB — MRSA PCR SCREENING: MRSA by PCR: NEGATIVE

## 2019-10-15 LAB — PROTIME-INR
INR: 1.7 — ABNORMAL HIGH (ref 0.8–1.2)
Prothrombin Time: 19.5 seconds — ABNORMAL HIGH (ref 11.4–15.2)

## 2019-10-15 MED ORDER — DICLOFENAC SODIUM 1 % EX GEL
2.0000 g | Freq: Four times a day (QID) | CUTANEOUS | Status: DC | PRN
  Administered 2019-10-15: 2 g via TOPICAL
  Filled 2019-10-15: qty 100

## 2019-10-15 MED ORDER — LORAZEPAM 2 MG/ML IJ SOLN
1.0000 mg | Freq: Once | INTRAMUSCULAR | Status: AC
  Administered 2019-10-15: 1 mg via INTRAVENOUS
  Filled 2019-10-15: qty 1

## 2019-10-15 MED ORDER — IOHEXOL 9 MG/ML PO SOLN
ORAL | Status: AC
  Administered 2019-10-16: 500 mL
  Filled 2019-10-15: qty 500

## 2019-10-15 MED ORDER — ACETAMINOPHEN 325 MG PO TABS
325.0000 mg | ORAL_TABLET | Freq: Once | ORAL | Status: AC
  Administered 2019-10-15: 325 mg via ORAL
  Filled 2019-10-15: qty 1

## 2019-10-15 MED ORDER — IOHEXOL 9 MG/ML PO SOLN: 500.0000 mL | ORAL | Status: AC

## 2019-10-15 NOTE — Progress Notes (Signed)
[name] 11:04 AM  Subjective: Patient seen and examined and her case discussed with her husband the nurse and my partner Dr. Alessandra Bevels in her hospital computer chart was reviewed and she had probable lower GI bleeding in the hospital only with mostly maroon stool and clots and none at home and only a smear last night and she does occasionally have various abdominal pains and she is not on any aspirin or blood thinners at home or nonsteroidals and only rarely takes a Tylenol and the patient has no current complaints  Objective: Vital signs stable afebrile no acute distress not really answering questions abdomen is soft multiple scars minimal discomfort in the upper abdomen on palpation no guarding or rebound hemoglobin and BUN stable platelets a little better  Assessment: Seemingly resolved GI bleeding questionable etiology  Plan: I will review her office chart to see exactly what was done when but could consider a CT scan just to rule out anything significant and might consider an endoscopy first if signs of bleeding continue and all of the above was discussed with the husband and will check on tomorrow  [facility]  office [phone] After 5PM or if no answer call [phone]

## 2019-10-15 NOTE — Progress Notes (Signed)
RN attempted to call CT to see when abdominal CT could be performed. No response from radiology at this time. RN will continue to try to make contact.

## 2019-10-15 NOTE — Plan of Care (Signed)
  Problem: Clinical Measurements: Goal: Respiratory complications will improve Outcome: Progressing   Problem: Clinical Measurements: Goal: Ability to maintain clinical measurements within normal limits will improve Outcome: Not Progressing   Problem: Education: Goal: Ability to identify signs and symptoms of gastrointestinal bleeding will improve Outcome: Not Progressing   Problem: Fluid Volume: Goal: Will show no signs and symptoms of excessive bleeding Outcome: Not Progressing

## 2019-10-15 NOTE — Progress Notes (Signed)
PROGRESS NOTE  [name]:[id number] DOB: 1940-12-03 DOA: 10/12/2019 PCP: [name], MD  HPI/Recap of past 24 hours: HPI from Dr [place] Murlean J Oftedahl is a 79 y.o. female with medical history significant of advanced dementia, depression, essential hypertension, GERD, and bladder cancer status post ostomy who presented to the ED via EMS for worsening confusion, decreased oral intake and progressive weakness. Patient with history advanced dementia and husband at bedside gives majority of the HPI. Husband reports that her dementia has slowly advanced over the last 2 years, but at baseline she is able to ambulate with the use of a walker.  Currently lives at home with husband with occasional outside support. In the ED, VS showed: Temp of  100.8, HR 63, RR 20, BP 99/88, SPO2 95% on 2 L nasal cannula.  WBC count 2.5, hemoglobin 10.2, platelet count 26, BUN 29, creatinine 0.90, Lactic acid 4.0. Covid-19/SARS-CoV-2 PCR negative.  Rapid influenza negative.  INR 1.8.  Chest x-ray with right lower base density.  Patient was given a 2 L NS bolus with azithromycin and ceftriaxone.  ED physician referred for admission for acute hypoxic respiratory failure/sepsis likely secondary to community-acquired pneumonia.     Today, patient reports feeling okay, GI bleed seems to be resolving, currently denies any abdominal pain, nausea/vomiting, fever/chills, chest pain, shortness of breath.     Assessment/Plan: Principal Problem:   Acute respiratory failure with hypoxia (HCC) Active Problems:   Bladder cancer (HCC)   Thrombocytopenia (HCC)   Dementia without behavioral disturbance ([place])   Sepsis ([place])   Essential hypertension   Community acquired pneumonia   Acute hypoxic respiratory failure likely 2/2 CAP Sepsis, present on admission, ??UTI Afebrile, leukopenia (chronic) Lactic acid of 4.0-->2.4-->1.5 BC X 2 NGTD Procalcitonin elevated 17--> 19-->13 Covid-19/SARS-CoV-2 PCR  negative.  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 Noted to have multiple episodes of bleeding per rectum Hemoglobin trending down, platelets very low, INR elevated, trend Transfused 2 units of PRBC, as well as 2 units of platelets on 10/13/19, 2 units of platelets/FFP on 10/14/2019 IV octreotide, Protonix GI consulted, noted large hemorrhoids, not a candidate for endoscopy due to thrombocytopenia, continue lactulose, lidocaine ointment for external hemorrhoids, consider CT abdomen/pelvis, currently pending Heme-onc on board, started Granix on 10/14/2019 X 5 doses, and received Feraheme on 10/14/2019 Daily CBC  Bradycardia Not on any rate controlling medication Monitor closely  Essential hypertension BP improving Hold home losartan and spironolactone Monitor closely  Depression Continue Celexa 40 mg p.o. daily  GERD Continue PPI  Hx bladder cancer status post urostomy Wound ostomy consult for continue management of urostomy while inpatient  Remote history of stage II adenocarcinoma of the colon  GOC Very poor prognosis Palliative care consulted for possible ?hospice         Malnutrition Type:  Nutrition Problem: Inadequate oral intake Etiology: lethargy/confusion   Malnutrition Characteristics:  Signs/Symptoms: per patient/family report   Nutrition Interventions:  Interventions: Refer to RD note for recommendations    Estimated body mass index is 28.34 kg/m as calculated from the following:   Height as of this encounter: 5' 3"  (1.6 m).   Weight as of this  encounter: 72.6 kg.  Code Status: DNR  Family Communication: Attempted to reach spouse on 10/13/2019, unavailable  Disposition Plan: Likely home, versus SNF   Consultants:  GI  Heme-onc  Procedures:  None  Antimicrobials:  Azithromycin  Ceftriaxone  DVT prophylaxis: SCDs   Objective: Vitals:   10/15/19 1107 10/15/19 1200 10/15/19 1300 10/15/19 1400  BP: (!) 151/52 139/89 107/82 (!) 138/56  Pulse: (!) 46 (!) 47 (!) 46 (!) 46  Resp: 19 15 14 18   Temp:  98 F (36.7 C)    TempSrc:      SpO2: 100% 100% 100% 95%  Weight:      Height:        Intake/Output Summary (Last 24 hours) at 10/15/2019 1540 Last data filed at 10/15/2019 1412 Gross per 24 hour  Intake 2341.58 ml  Output 1125 ml  Net 1216.58 ml   Filed Weights   10/12/19 0946  Weight: 72.6 kg    Exam:  General: NAD, chronically ill-appearing  Cardiovascular: S1, S2 present  Respiratory:  Diminished breath sounds bilaterally  Abdomen: Soft, nontender, nondistended, bowel sounds present, urostomy noted  Musculoskeletal: No bilateral pedal edema noted  Skin: Normal  Psychiatry:  Poor judgment and insight   Data Reviewed: CBC: Recent Labs  Lab 10/12/19 1012 10/13/19 0600 10/13/19 1004 10/13/19 2218 10/14/19 0134 10/14/19 1648 10/15/19 0222  WBC 2.5*   < > 1.6* 1.6* 1.3* 1.7* 3.3*  NEUTROABS 2.2  --   --   --  0.7*  --  2.3  HGB 10.2*   < > 7.9* 8.9* 8.5* 8.6* 9.1*  HCT 31.5*   < > 24.8* 27.4* 25.9* 28.1* 28.1*  MCV 96.9   < > 98.0 97.5 96.6 99.6 96.9  PLT 26*   < > 23* 32* 30* 40* 46*   < > = values in this interval not displayed.   Basic Metabolic Panel: Recent Labs  Lab 10/12/19 1012 10/13/19 0600 10/14/19 0134 10/15/19 0222  NA 140 139 143 140  K 4.0 4.9 4.5 3.8  CL 108 110 115* 111  CO2 24 19* 22 22  GLUCOSE 117* 126* 125* 78  BUN 29* 36* 42* 37*  CREATININE 0.90 1.06* 0.90 0.89  CALCIUM 10.3 9.1 9.0 9.0   GFR: Estimated Creatinine Clearance: 48.2 mL/min (by  C-G formula based on SCr of 0.89 mg/dL). Liver Function Tests: Recent Labs  Lab 10/12/19 1012 10/14/19 0134 10/15/19 0222  AST 29 41 35  ALT 18 23 22   ALKPHOS 64 43 49  BILITOT 2.9* 1.8* 1.5*  PROT 5.5* 4.8* 4.9*  ALBUMIN 2.9* 2.4* 2.6*   No results for input(s): LIPASE, AMYLASE in the last 168 hours. No results for input(s): AMMONIA in the last 168 hours. Coagulation Profile: Recent Labs  Lab 10/12/19 1012 10/14/19 0134 10/15/19 0222  INR 1.8* 1.8* 1.7*   Cardiac Enzymes: No results for input(s): CKTOTAL, CKMB, CKMBINDEX, TROPONINI in the last 168 hours. BNP (last 3 results) No results for input(s): PROBNP in the last 8760 hours. HbA1C: No results for input(s): HGBA1C in the last 72 hours. CBG: No results for input(s): GLUCAP in the last 168 hours. Lipid Profile: No results for input(s): CHOL, HDL, LDLCALC, TRIG, CHOLHDL, LDLDIRECT in the last 72 hours. Thyroid Function Tests: Recent Labs    10/12/19 1905  TSH 0.482   Anemia Panel: Recent Labs    10/14/19 1131  FERRITIN 48  TIBC 263  IRON 63   Urine analysis:    Component Value Date/Time   COLORURINE AMBER (A)  10/12/2019 1218   APPEARANCEUR CLOUDY (A) 10/12/2019 1218   LABSPEC 1.015 10/12/2019 1218   PHURINE 7.0 10/12/2019 [place] 10/12/2019 [place] 10/12/2019 [place] 10/12/2019 [place] 10/12/2019 1218   PROTEINUR 100 (A) 10/12/2019 1218   UROBILINOGEN 1.0 04/06/2014 1856   NITRITE NEGATIVE 10/12/2019 1218   LEUKOCYTESUR TRACE (A) 10/12/2019 1218   Sepsis Labs: @LABRCNTIP (procalcitonin:4,lacticidven:4)  ) Recent Results (from the past 240 hour(s))  Culture, blood (Routine x 2)     Status: None (Preliminary result)   Collection Time: 10/12/19 10:12 AM   Specimen: BLOOD  Result Value Ref Range Status   Specimen Description   Final    BLOOD BLOOD LEFT FOREARM Performed at [facility]: [place] [address], [place] [address],  [place], [place] [address]    Special Requests   Final    BOTTLES DRAWN AEROBIC AND ANAEROBIC Blood Culture adequate volume Performed at [place] [address], [place], [place] [address]    Culture   Final    NO GROWTH 3 DAYS Performed at [facility], [place] [address], [place], [place] [address]    Report Status PENDING  Incomplete  Respiratory Panel by RT PCR (Flu A&B, Covid) - Nasopharyngeal Swab     Status: None   Collection Time: 10/12/19 10:12 AM   Specimen: Nasopharyngeal Swab  Result Value Ref Range Status   SARS Coronavirus 2 by RT PCR NEGATIVE NEGATIVE Final    Comment: (NOTE) SARS-CoV-2 target nucleic acids are NOT DETECTED. The SARS-CoV-2 RNA is generally detectable in upper respiratoy specimens during the acute phase of infection. The lowest concentration of SARS-CoV-2 viral copies this assay can detect is 131 copies/mL. A negative result does not preclude SARS-Cov-2 infection and should not be used as the sole basis for treatment or other patient management decisions. A negative result may occur with  improper specimen collection/handling, submission of specimen other than nasopharyngeal swab, presence of viral mutation(s) within the areas targeted by this assay, and inadequate number of viral copies (<131 copies/mL). A negative result must be combined with clinical observations, patient history, and epidemiological information. The expected result is Negative. Fact Sheet for Patients:  PinkCheek.be Fact Sheet for Healthcare Providers:  GravelBags.it This test is not yet ap proved or cleared by the [place] FDA and  has been authorized for detection and/or diagnosis of SARS-CoV-2 by FDA under an Emergency Use Authorization (EUA). This EUA will remain  in effect (meaning this test can be used) for the duration of the COVID-19 declaration under Section 564(b)(1) of the Act, 21  U.S.C. section 360bbb-3(b)(1), unless the authorization is terminated or revoked sooner.    Influenza A by PCR NEGATIVE NEGATIVE Final   Influenza B by PCR NEGATIVE NEGATIVE Final    Comment: (NOTE) The Xpert Xpress SARS-CoV-2/FLU/RSV assay is intended as an aid in  the diagnosis of influenza from Nasopharyngeal swab specimens and  should not be used as a sole basis for treatment. Nasal washings and  aspirates are unacceptable for Xpert Xpress SARS-CoV-2/FLU/RSV  testing. Fact Sheet for Patients: PinkCheek.be Fact Sheet for Healthcare Providers: GravelBags.it This test is not yet approved or cleared by the [place] FDA and  has been authorized for detection and/or diagnosis of SARS-CoV-2 by  FDA under an Emergency Use Authorization (EUA). This EUA will remain  in effect (meaning this test can be used) for the duration of the  Covid-19 declaration under  Section 564(b)(1) of the Act, 21  U.S.C. section 360bbb-3(b)(1), unless the authorization is  terminated or revoked. Performed at [facility], [place] [address] [place], [place] [address]   Urine culture     Status: Abnormal   Collection Time: 10/12/19 12:18 PM   Specimen: Urine, Catheterized  Result Value Ref Range Status   Specimen Description   Final    URINE, CATHETERIZED Performed at [place] [address], [place], [place] [address]    Special Requests   Final    NONE Performed at [facility], [place] [address] [place], [place] [address]    Culture MULTIPLE SPECIES PRESENT, SUGGEST RECOLLECTION (A)  Final   Report Status 10/14/2019 FINAL  Final  Culture, blood (Routine x 2)     Status: None (Preliminary result)   Collection Time: 10/12/19  7:05 PM   Specimen: BLOOD RIGHT HAND  Result Value Ref Range Status   Specimen Description   Final    BLOOD RIGHT HAND Performed at [place] [address], [place], [place] [address]    Special Requests   Final    BOTTLES DRAWN AEROBIC ONLY Blood Culture adequate volume Performed at [place] [address] [place], [place] [address]    Culture   Final    NO GROWTH 2 DAYS Performed at [place] [address] [place], [place] [address]    Report Status PENDING  Incomplete  MRSA PCR Screening     Status: None   Collection Time: 10/14/19  8:16 PM   Specimen: Nasal Mucosa; Nasopharyngeal  Result Value Ref Range Status   MRSA by PCR NEGATIVE NEGATIVE Final    Comment:        The GeneXpert MRSA Assay (FDA approved for NASAL specimens only), is one component of a comprehensive MRSA colonization surveillance program. It is not intended to diagnose MRSA infection nor to guide or monitor treatment for MRSA infections. Performed at [facility], [place] [address] [place], [place] [address]       Studies: No results found.  Scheduled Meds: . chlorhexidine  15 mL Mouth Rinse BID  . Chlorhexidine Gluconate Cloth  6 each Topical Daily  . citalopram  20 mg Oral Daily  . lactulose  10 g Oral BID  . mouth rinse  15 mL Mouth Rinse q12n4p  . pantoprazole (PROTONIX) IV  40 mg Intravenous Q12H  . Tbo-Filgrastim  480 mcg Subcutaneous q1800    Continuous Infusions: . azithromycin Stopped (10/15/19 1239)  . cefTRIAXone (ROCEPHIN)  IV Stopped (10/15/19 1051)  . octreotide  (SANDOSTATIN)    IV infusion 50 mcg/hr (10/15/19 1200)     LOS: 3 days     Alma Friendly, MD [facility]  If 7PM-7AM, please contact night-coverage [email or web address] 10/15/2019, 3:40 PM

## 2019-10-16 ENCOUNTER — Encounter (HOSPITAL_COMMUNITY): Payer: Self-pay | Admitting: Internal Medicine

## 2019-10-16 ENCOUNTER — Inpatient Hospital Stay (HOSPITAL_COMMUNITY): Payer: Medicare Other

## 2019-10-16 DIAGNOSIS — Z515 Encounter for palliative care: Secondary | ICD-10-CM

## 2019-10-16 DIAGNOSIS — Z7189 Other specified counseling: Secondary | ICD-10-CM

## 2019-10-16 DIAGNOSIS — R531 Weakness: Secondary | ICD-10-CM

## 2019-10-16 LAB — PROTIME-INR
INR: 2 — ABNORMAL HIGH (ref 0.8–1.2)
Prothrombin Time: 23 seconds — ABNORMAL HIGH (ref 11.4–15.2)

## 2019-10-16 LAB — COMPREHENSIVE METABOLIC PANEL
ALT: 21 U/L (ref 0–44)
AST: 30 U/L (ref 15–41)
Albumin: 2.5 g/dL — ABNORMAL LOW (ref 3.5–5.0)
Alkaline Phosphatase: 56 U/L (ref 38–126)
Anion gap: 6 (ref 5–15)
BUN: 28 mg/dL — ABNORMAL HIGH (ref 8–23)
CO2: 24 mmol/L (ref 22–32)
Calcium: 8.9 mg/dL (ref 8.9–10.3)
Chloride: 109 mmol/L (ref 98–111)
Creatinine, Ser: 0.85 mg/dL (ref 0.44–1.00)
GFR calc Af Amer: >60 mL/min (ref >60)
GFR calc non Af Amer: >60 mL/min (ref >60)
Glucose, Bld: 101 mg/dL — ABNORMAL HIGH (ref 70–99)
Potassium: 4.1 mmol/L (ref 3.5–5.1)
Sodium: 139 mmol/L (ref 135–145)
Total Bilirubin: 2.6 mg/dL — ABNORMAL HIGH (ref 0.3–1.2)
Total Protein: 4.8 g/dL — ABNORMAL LOW (ref 6.5–8.1)

## 2019-10-16 LAB — CBC WITH DIFFERENTIAL/PLATELET
Abs Immature Granulocytes: 1.2 10*3/uL — ABNORMAL HIGH (ref 0.00–0.07)
Basophils Absolute: 0.1 10*3/uL (ref 0.0–0.1)
Basophils Relative: 1 %
Eosinophils Absolute: 0.1 10*3/uL (ref 0.0–0.5)
Eosinophils Relative: 2 %
HCT: 28.7 % — ABNORMAL LOW (ref 36.0–46.0)
Hemoglobin: 9.4 g/dL — ABNORMAL LOW (ref 12.0–15.0)
Immature Granulocytes: 16 %
Lymphocytes Relative: 8 %
Lymphs Abs: 0.6 10*3/uL — ABNORMAL LOW (ref 0.7–4.0)
MCH: 31.9 pg (ref 26.0–34.0)
MCHC: 32.8 g/dL (ref 30.0–36.0)
MCV: 97.3 fL (ref 80.0–100.0)
Monocytes Absolute: 0.9 10*3/uL (ref 0.1–1.0)
Monocytes Relative: 12 %
Neutro Abs: 4.8 10*3/uL (ref 1.7–7.7)
Neutrophils Relative %: 61 %
Platelets: 36 10*3/uL — ABNORMAL LOW (ref 150–400)
RBC: 2.95 MIL/uL — ABNORMAL LOW (ref 3.87–5.11)
RDW: 17.2 % — ABNORMAL HIGH (ref 11.5–15.5)
WBC: 7.7 10*3/uL (ref 4.0–10.5)
nRBC: 0.8 % — ABNORMAL HIGH (ref 0.0–0.2)

## 2019-10-16 MED ORDER — SODIUM CHLORIDE (PF) 0.9 % IJ SOLN
INTRAMUSCULAR | Status: AC
  Filled 2019-10-16: qty 50

## 2019-10-16 MED ORDER — IOHEXOL 300 MG/ML  SOLN
100.0000 mL | Freq: Once | INTRAMUSCULAR | Status: AC | PRN
  Administered 2019-10-16: 100 mL via INTRAVENOUS

## 2019-10-16 NOTE — Progress Notes (Signed)
[name] 9:58 AM  Subjective: Patient seen and examined and case discussed with her nurse as well as the patient's husband and she has no new complaints and is a little more sedated after getting some medicine for agitation but specifically no signs of bleeding and she is moving her bowels according to the nurse and her office computer chart was reviewed and her last colonoscopy and endoscopy was May 2015  Objective: Vital signs stable afebrile no acute distress abdomen is nontender BUN decreased hemoglobin stable CT scan okay without significant new worrisome finding discussed with husband  Assessment: Multiple medical problems including cirrhosis and seemingly self-limited bright red blood per rectum  Plan: Please let us know if we can be of any further assistance with this hospital stay otherwise she can follow-up with her primary gastroenterologist Dr. [name] as an outpatient to decide if outpatient work-up is required or needed  [facility]  office [phone] After 5PM or if no answer call [phone]

## 2019-10-16 NOTE — Progress Notes (Addendum)
PROGRESS NOTE  [name]:[id number] DOB: 1941-06-24 DOA: 10/12/2019 PCP: Erica Frizzle, MD  HPI/Recap of past 24 hours: HPI from Dr [place] [name] is a 79 y.o. female with medical history significant of advanced dementia, depression, essential hypertension, GERD, and bladder cancer status post ostomy who presented to the ED via EMS for worsening confusion, decreased oral intake and progressive weakness. Patient with history advanced dementia and husband at bedside gives majority of the HPI. Husband reports that her dementia has slowly advanced over the last 2 years, but at baseline she is able to ambulate with the use of a walker.  Currently lives at home with husband with occasional outside support. In the ED, VS showed: Temp of  100.8, HR 63, RR 20, BP 99/88, SPO2 95% on 2 L nasal cannula.  WBC count 2.5, hemoglobin 10.2, platelet count 26, BUN 29, creatinine 0.90, Lactic acid 4.0. Covid-19/SARS-CoV-2 PCR negative.  Rapid influenza negative.  INR 1.8.  Chest x-ray with right lower base density.  Patient was given a 2 L NS bolus with azithromycin and ceftriaxone.  ED physician referred for admission for acute hypoxic respiratory failure/sepsis likely secondary to community-acquired pneumonia.     Today, met patient sleeping, easily arousable, confused at baseline, lethargic. No significant bleeding noted     Assessment/Plan: Principal Problem:   Acute respiratory failure with hypoxia (HCC) Active Problems:   Bladder cancer (HCC)   Thrombocytopenia (HCC)   Dementia without behavioral disturbance ([place])   Sepsis ([place])   Essential hypertension   Community acquired pneumonia   Acute hypoxic respiratory failure likely 2/2 CAP Sepsis, present on admission, ??UTI Afebrile, leukopenia (chronic) Lactic acid of 4.0-->2.4-->1.5 BC X 2 NGTD Procalcitonin elevated 17--> 19-->13 Covid-19/SARS-CoV-2 PCR negative.  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 Noted to have multiple episodes of bleeding per rectum Hemoglobin trending down, platelets very low, INR elevated, trend Transfused 2 units of PRBC, as well as 2 units of platelets on 10/13/19, 2 units of platelets/FFP on 10/14/2019 D/C IV octreotide on 10/16/19, continue Protonix GI consulted, noted large hemorrhoids, not a candidate for endoscopy due to thrombocytopenia, continue lactulose, lidocaine ointment for external hemorrhoids, consider CT abdomen/pelvis showed multifocal nonspecific bowel wall thickening, cirrhosis with portal hypertension, splenomegaly, paraesophageal varices, 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 daily  GERD Continue PPI  Hx bladder cancer status post urostomy Wound ostomy consult for continue management of urostomy while inpatient  Remote history of stage II adenocarcinoma of the colon  GOC Very poor prognosis Palliative care on board         Malnutrition Type:  Nutrition Problem: Inadequate oral intake Etiology: lethargy/confusion   Malnutrition Characteristics:  Signs/Symptoms: per patient/family report   Nutrition Interventions:  Interventions: Refer to RD note for recommendations     Estimated body mass index is 28.34 kg/m as calculated from the  following:   Height as of this encounter: 5' 3"  (1.6 m).   Weight as of this encounter: 72.6 kg.     Code Status: DNR  Family Communication: Discussed with spouse on 10/16/2019  Disposition Plan: Likely home, family refusing SNF   Consultants:  GI  Heme-onc  Palliative  Procedures:  None  Antimicrobials:  None, completed  DVT prophylaxis: SCDs   Objective: Vitals:   10/16/19 1100 10/16/19 1200 10/16/19 1300 10/16/19 1400  BP:  (!) 133/43  (!) 140/46  Pulse: (!) 50 (!) 50 (!) 53 (!) 51  Resp: 13 16 17 18   Temp:  98.8 F (37.1 C)    TempSrc:      SpO2: 93% 95% 96% 95%  Weight:      Height:        Intake/Output Summary (Last 24 hours) at 10/16/2019 1455 Last data filed at 10/16/2019 1200 Gross per 24 hour  Intake 493.09 ml  Output 1250 ml  Net -756.91 ml   Filed Weights   10/12/19 0946  Weight: 72.6 kg    Exam:  General: NAD, chronically ill-appearing, lethargic  Cardiovascular: S1, S2 present  Respiratory:  Diminished breath sounds bilaterally  Abdomen: Soft, nontender, nondistended, bowel sounds present, urostomy noted  Musculoskeletal: No bilateral pedal edema noted  Skin: Normal  Psychiatry:  Poor judgment and insight   Data Reviewed: CBC: Recent Labs  Lab 10/12/19 1012 10/13/19 0600 10/13/19 2218 10/14/19 0134 10/14/19 1648 10/15/19 0222 10/16/19 0555  WBC 2.5*   < > 1.6* 1.3* 1.7* 3.3* 7.7  NEUTROABS 2.2  --   --  0.7*  --  2.3 4.8  HGB 10.2*   < > 8.9* 8.5* 8.6* 9.1* 9.4*  HCT 31.5*   < > 27.4* 25.9* 28.1* 28.1* 28.7*  MCV 96.9   < > 97.5 96.6 99.6 96.9 97.3  PLT 26*   < > 32* 30* 40* 46* 36*   < > = values in this interval not displayed.   Basic Metabolic Panel: Recent Labs  Lab 10/12/19 1012 10/13/19 0600 10/14/19 0134 10/15/19 0222 10/16/19 0555  NA 140 139 143 140 139  K 4.0 4.9 4.5 3.8 4.1  CL 108 110 115* 111 109  CO2 24 19* 22 22 24    GLUCOSE 117* 126* 125* 78 101*  BUN 29* 36* 42* 37* 28*  CREATININE 0.90 1.06* 0.90 0.89 0.85  CALCIUM 10.3 9.1 9.0 9.0 8.9   GFR: Estimated Creatinine Clearance: 50.4 mL/min (by C-G formula based on SCr of 0.85 mg/dL). Liver Function Tests: Recent Labs  Lab 10/12/19 1012 10/14/19 0134 10/15/19 0222 10/16/19 0555  AST 29 41 35 30  ALT 18 23 22 21   ALKPHOS 64 43 49 56  BILITOT 2.9* 1.8* 1.5* 2.6*  PROT 5.5* 4.8* 4.9* 4.8*  ALBUMIN 2.9* 2.4* 2.6* 2.5*   No results for input(s): LIPASE, AMYLASE in the last 168 hours. No results for input(s): AMMONIA in the last 168 hours. Coagulation Profile: Recent Labs  Lab 10/12/19 1012 10/14/19 0134 10/15/19 0222 10/16/19 0555  INR 1.8* 1.8* 1.7* 2.0*   Cardiac Enzymes: No results for input(s): CKTOTAL, CKMB, CKMBINDEX, TROPONINI in the last 168 hours. BNP (last 3 results) No results for input(s): PROBNP in the last 8760 hours. HbA1C: No results for input(s): HGBA1C in the last 72 hours. CBG: No results for input(s): GLUCAP in the last 168 hours. Lipid Profile: No results for input(s): CHOL, HDL, LDLCALC, TRIG, CHOLHDL, LDLDIRECT in the last 72 hours. Thyroid Function Tests:  No results for input(s): TSH, T4TOTAL, FREET4, T3FREE, THYROIDAB in the last 72 hours. Anemia Panel: Recent Labs    10/14/19 1131  FERRITIN 48  TIBC 263  IRON 63   Urine analysis:    Component Value Date/Time   COLORURINE AMBER (A) 10/12/2019 1218   APPEARANCEUR CLOUDY (A) 10/12/2019 1218   LABSPEC 1.015 10/12/2019 1218   PHURINE 7.0 10/12/2019 [place] 10/12/2019 [place] 10/12/2019 [place] 10/12/2019 [place] 10/12/2019 1218   PROTEINUR 100 (A) 10/12/2019 1218   UROBILINOGEN 1.0 04/06/2014 1856   NITRITE NEGATIVE 10/12/2019 1218   LEUKOCYTESUR TRACE (A) 10/12/2019 1218   Sepsis Labs: @LABRCNTIP (procalcitonin:4,lacticidven:4)  ) Recent Results (from the past 240 hour(s))   Culture, blood (Routine x 2)     Status: None (Preliminary result)   Collection Time: 10/12/19 10:12 AM   Specimen: BLOOD  Result Value Ref Range Status   Specimen Description   Final    BLOOD BLOOD LEFT FOREARM Performed at [facility], [place] [address], [place], [place] [address]    Special Requests   Final    BOTTLES DRAWN AEROBIC AND ANAEROBIC Blood Culture adequate volume Performed at [place] [address], [place], [place] [address]    Culture   Final    NO GROWTH 4 DAYS Performed at [facility], [place] [address], [place], [place] [address]    Report Status PENDING  Incomplete  Respiratory Panel by RT PCR (Flu A&B, Covid) - Nasopharyngeal Swab     Status: None   Collection Time: 10/12/19 10:12 AM   Specimen: Nasopharyngeal Swab  Result Value Ref Range Status   SARS Coronavirus 2 by RT PCR NEGATIVE NEGATIVE Final    Comment: (NOTE) SARS-CoV-2 target nucleic acids are NOT DETECTED. The SARS-CoV-2 RNA is generally detectable in upper respiratoy specimens during the acute phase of infection. The lowest concentration of SARS-CoV-2 viral copies this assay can detect is 131 copies/mL. A negative result does not preclude SARS-Cov-2 infection and should not be used as the sole basis for treatment or other patient management decisions. A negative result may occur with  improper specimen collection/handling, submission of specimen other than nasopharyngeal swab, presence of viral mutation(s) within the areas targeted by this assay, and inadequate number of viral copies (<131 copies/mL). A negative result must be combined with clinical observations, patient history, and epidemiological information. The expected result is Negative. Fact Sheet for Patients:  PinkCheek.be Fact Sheet for Healthcare Providers:  GravelBags.it This test is not yet ap proved or cleared by the [place] FDA and  has been authorized for detection and/or diagnosis of SARS-CoV-2 by FDA under an Emergency Use Authorization (EUA). This EUA will remain  in effect (meaning this test can be used) for the duration of the COVID-19 declaration under Section 564(b)(1) of the Act, 21 U.S.C. section 360bbb-3(b)(1), unless the authorization is terminated or revoked sooner.    Influenza A by PCR NEGATIVE NEGATIVE Final   Influenza B by PCR NEGATIVE NEGATIVE Final    Comment: (NOTE) The Xpert Xpress SARS-CoV-2/FLU/RSV assay is intended as an aid in  the diagnosis of influenza from Nasopharyngeal swab specimens and  should not be used as a sole basis for treatment. Nasal washings and  aspirates are unacceptable for Xpert Xpress SARS-CoV-2/FLU/RSV  testing. Fact Sheet for Patients: PinkCheek.be Fact Sheet for Healthcare Providers: GravelBags.it This test is not yet approved or cleared  by the [place] and  has been authorized for detection and/or diagnosis of SARS-CoV-2 by  FDA under an Emergency Use Authorization (EUA). This EUA will remain  in effect (meaning this test can be used) for the duration of the  Covid-19 declaration under Section 564(b)(1) of the Act, 21  U.S.C. section 360bbb-3(b)(1), unless the authorization is  terminated or revoked. Performed at [facility], [place] [address], [place], [place] [address]   Urine culture     Status: Abnormal   Collection Time: 10/12/19 12:18 PM   Specimen: Urine, Catheterized  Result Value Ref Range Status   Specimen Description   Final    URINE, CATHETERIZED Performed at [place] [address], [place], [place] [address]    Special Requests   Final    NONE Performed at [facility], [place] [address], [place], [place] [address]    Culture MULTIPLE SPECIES PRESENT, SUGGEST RECOLLECTION (A)  Final   Report Status  10/14/2019 FINAL  Final  Culture, blood (Routine x 2)     Status: None (Preliminary result)   Collection Time: 10/12/19  7:05 PM   Specimen: BLOOD RIGHT HAND  Result Value Ref Range Status   Specimen Description   Final    BLOOD RIGHT HAND Performed at [place] [address], [place], [place] [address]    Special Requests   Final    BOTTLES DRAWN AEROBIC ONLY Blood Culture adequate volume Performed at [place] [address], [place], [place] [address]    Culture   Final    NO GROWTH 3 DAYS Performed at [facility], [place] [address], [place], [place] [address]    Report Status PENDING  Incomplete  MRSA PCR Screening     Status: None   Collection Time: 10/14/19  8:16 PM   Specimen: Nasal Mucosa; Nasopharyngeal  Result Value Ref Range Status   MRSA by PCR NEGATIVE NEGATIVE Final    Comment:        The GeneXpert MRSA Assay (FDA approved for NASAL specimens only), is one component of a comprehensive MRSA colonization surveillance program. It is not intended to diagnose MRSA infection nor to guide or monitor treatment for MRSA infections. Performed at [facility], [place] [address], [place], [place] [address]       Studies: CT ABDOMEN PELVIS W CONTRAST  Result Date: 10/16/2019 CLINICAL DATA:  GI bleed. Pancytopenia/liver cirrhosis with coli Juliette Alcide. Multiple episodes of bleeding per rectum. EXAM: CT ABDOMEN AND PELVIS WITH CONTRAST TECHNIQUE: Multidetector CT imaging of the abdomen and pelvis was performed using the standard protocol following bolus administration of intravenous contrast. CONTRAST:  164m OMNIPAQUE IOHEXOL 300 MG/ML  SOLN COMPARISON:  CT 04/06/2014, MRI 04/02/2016. Most recent ultrasound 05/09/2019 FINDINGS: Lower chest: Small to moderate bilateral pleural effusions and adjacent compressive atelectasis. Mild cardiomegaly. Wall thickening of the distal esophagus. There are paraesophageal  varices. Hepatobiliary: Nodular hepatic contours consistent with cirrhosis. 7 mm hypodense lesion in the inferior right lobe of the liver series 2, image 21, unchanged from prior. Left lobe liver lesion on prior MRI is not definitively seen. Postcholecystectomy. Partially occlusive thrombus in the portal splenic confluence is again seen. There is likely thrombus in the main portal vein, series 5, image 82. No flow was demonstrated on most recent ultrasound. Pancreas: Parenchymal atrophy. No ductal dilatation or inflammation. Spleen: Splenomegaly with spleen measuring 18.7 x 8.0 x 16.3 cm (volume =  1300 cm^3). No focal splenic abnormality. Adrenals/Urinary Tract: Normal adrenal glands. Mild right hydronephrosis. No evidence of obstructing stone. Right ureter is decompressed. Mild left cortical scarring in the upper pole. No left hydronephrosis. Previous left renal stones are no longer seen. Low-density lesion in the upper left kidney likely cyst. Small exophytic cyst in the lower left kidney. The bladder is not visualized, patient with history of cystostomy per history. Right lower quadrant urostomy. There is absent excretion on delayed phase imaging suggesting underlying renal dysfunction. Stomach/Bowel: Distal esophageal wall thickening with paraesophageal varices. There perigastric varices. Stomach is decompressed. Mild esophageal wall thickening, may be accentuated by ascites. Wall thickening of the proximal small bowel, for example series 2, image 39. More distal small bowel appears normal. Administered enteric contrast reaches the colon. No obstruction. There is colonic wall thickening from the mid transverse through the mid descending. Possible wall thickening of the distal sigmoid colon and rectum. No evidence of free air or perforation. No bowel pneumatosis. Colonic diverticulosis without diverticulitis. Post appendectomy. Vascular/Lymphatic: Chronic partially occlusive thrombus in the portal splenic  confluence. Suspected portal vein occlusion. Left upper quadrant collaterals with splenorenal shunting. Aortic atherosclerosis without aneurysm. No definite enlarged lymph nodes in the abdomen or pelvis. Reproductive: Status post hysterectomy. No adnexal masses. Other: Small moderate volume upper abdominal and mesenteric ascites. Mild generalized mesenteric fat stranding and edema. There is generalized subcutaneous soft tissue edema. Postsurgical change of the anterior abdominal wall. Right lower quadrant urostomy. Musculoskeletal: Bones are diffusely under mineralized. There is degenerative change in the spine. There are no acute or suspicious osseous abnormalities. IMPRESSION: 1. Multifocal nonspecific bowel wall thickening, any of which may be site of GI bleeding. There is wall thickening of the duodenum, proximal small bowel, mid transverse through mid descending colon. There is also wall thickening of the distal sigmoid colon and rectum. 2. Cirrhosis with portal hypertension, splenomegaly, and paraesophageal varices. 3. Chronic partially occlusive thrombus in the portal splenic confluence. Probable thrombus in the main portal vein, no portal venous flow was demonstrated on most recent ultrasound. 4. Small to moderate bilateral pleural effusions and adjacent compressive atelectasis. Small to moderate volume ascites in the abdomen generalized mesenteric edema. 5. Mild right hydronephrosis without obstructing stone or cause for obstruction. Absent excretion on delayed phase imaging suggesting underlying renal dysfunction. 6.  Aortic Atherosclerosis (ICD10-I70.0). Electronically Signed   By: Keith Rake M.D.   On: 10/16/2019 02:56    Scheduled Meds: . chlorhexidine  15 mL Mouth Rinse BID  . Chlorhexidine Gluconate Cloth  6 each Topical Daily  . citalopram  20 mg Oral Daily  . lactulose  10 g Oral BID  . mouth rinse  15 mL Mouth Rinse q12n4p  . pantoprazole (PROTONIX) IV  40 mg Intravenous Q12H  .  Tbo-Filgrastim  480 mcg Subcutaneous q1800    Continuous Infusions: . octreotide  (SANDOSTATIN)    IV infusion 50 mcg/hr (10/16/19 0800)     LOS: 4 days     Alma Friendly, MD [facility]  If 7PM-7AM, please contact night-coverage [email or web address] 10/16/2019, 2:55 PM

## 2019-10-16 NOTE — Consult Note (Signed)
Consultation Note Date: 10/16/2019   Patient Name: [name]  DOB: 03/15/1941  MRN: [id number]  Age / Sex: 79 y.o., female  PCP: [name], MD Referring Physician: Alma Friendly, MD  Reason for Consultation: Establishing goals of care  HPI/Patient Profile: 79 y.o. female admitted on 10/12/2019  Clinical Assessment and Goals of Care: Ms. [name] has advanced dementia, depression, hypertension, gastroesophageal reflux disease, bladder cancer status post ostomy.  She lives at home with her husband.  Patient arrived to the emergency department with worsening confusion diminished oral intake and progressive weakness.  Patient currently is admitted to hospital medicine service with acute hypoxic respiratory failure deemed secondary to community-acquired pneumonia questionable urine infection.  Covid negative.  Rapid influenza negative.  Hospital course also complicated by multiple episodes of bleeding per rectum, GI bleed.  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  She has been seen by GI, she had a CT abdomen done in this hospitalization.  She also sees Dr. [name].  Palliative consult for ongoing goals of care discussions has been requested.  Ms. [name] is resting in bed.  She does want some, does not verbalize.  Husband present at the bedside I met with him and talked about palliative care as well as goals of care discussions:  Palliative medicine is specialized medical care for people living with serious illness. It focuses on providing relief from the symptoms and stress of a serious illness. The goal is to improve quality of life for both the patient and the family.  Goals of care: Broad aims of medical therapy in relation to the patient's values and preferences. Our aim is to provide medical care aimed at enabling patients to achieve the goals that  matter most to them, given the circumstances of their particular medical situation and their constraints.   Goals wishes and values important to the patient and the family as a unit attempted to be elicited.  Discussed about patient's home situation, baseline and background.  Patient's husband states that her dementia has been progressive and there has been ongoing functional as well as cognitive decline for the last few months.  They have a private aide that comes by for a few hours several times a week.  Other than that, they do not have any home health care set up in the current moment. We talked about scope of current hospitalization.  We talked about acute issues that we are trying to resolve such as infection and GI bleed.  However, we also talked about patient's underlying serious illnesses and ongoing progressive incurable conditions of dementia and cirrhosis.  Discussed about disposition options.  Patient's husband wishes for the patient to remain at home and is accepting of home based nursing support at home.  Patient's daughter lives in [place], [name] and plans on moving in with the patient and her husband in [place], [place] to assist.  Please note additional discussion/recommendations as listed below.  NEXT OF KIN Patient lives at home in  [place], [place] with her husband [name].  [phone].  SUMMARY OF RECOMMENDATIONS   Agree with DO NOT RESUSCITATE Continue current mode of care, monitor hospital course and overall disease trajectory. Disposition and goals of care: Discussed with patient's husband at bedside.  They would like to take the patient home towards the end of this hospitalization.  Does not want to place her in a skilled nursing facility.  Certainly this is very reasonable.  Would recommend home health care, home physical therapy and outpatient palliative upon discharge.  Code Status/Advance Care Planning:  DNR    Symptom  Management:   Continue current mode of care  Palliative Prophylaxis:   Delirium Protocol   Psycho-social/Spiritual:   Desire for further Chaplaincy support:yes  Additional Recommendations: Caregiving  Support/Resources  Prognosis:   Unable to determine  Discharge Planning: To Be Determined      Primary Diagnoses: Present on Admission: . Acute respiratory failure with hypoxia ([place]) . Sepsis ([place]) . Thrombocytopenia ([place]) . Bladder cancer ([place]) . Community acquired pneumonia . Dementia without behavioral disturbance ([place]) . Essential hypertension   I have reviewed the medical record, interviewed the patient and family, and examined the patient. The following aspects are pertinent.  Past Medical History:  Diagnosis Date  . Anemia, iron deficiency   . Arthritis   . Bladder cancer [facility]) 2003   Bladder removed - [name]  . Cataract immature   . Cirrhosis ([place]) 08/26/2011  . Colon cancer [facility]) 2001   Hemicolectomy - [name]  . Depression   . GERD (gastroesophageal reflux disease)   . Headache(784.0)   . Hemorrhoids   . History of colon polyps   . History of DVT (deep vein thrombosis) 50+yrs ago   With pregnancy  . History of GI bleed   . History of kidney stones   . History of urostomy    Urostomy d/t hx bladder cancer  . Hx of transfusion of whole blood   . Hypertension   . Joint pain   . Memory loss    Chemotherapy; short and long term  . Osteoporosis   . Thrombocytopenia ([place])    Social History   Socioeconomic History  . Marital status: Married    Spouse name: Not on file  . Number of children: Not on file  . Years of education: Not on file  . Highest education level: Not on file  Occupational History  . Not on file  Tobacco Use  . Smoking status: Former Smoker    Packs/day: 0.50    Years: 35.00    Pack years: 17.50    Types: Cigarettes    Start date: 10/25/1955    Quit date: 09/22/1991    Years since quitting: 28.0  . Smokeless tobacco:  Never Used  . Tobacco comment: quit 23years ago  Substance and Sexual Activity  . Alcohol use: No    Alcohol/week: 0.0 standard drinks  . Drug use: No  . Sexual activity: Yes    Birth control/protection: Surgical  Other Topics Concern  . Not on file  Social History Narrative   Lives at home with her husband, ambulatory   Social Determinants of Health   Financial Resource Strain:   . Difficulty of Paying Living Expenses: Not on file  Food Insecurity:   . Worried About Charity fundraiser in the Last Year: Not on file  . Ran Out of Food in the Last Year: Not on file  Transportation Needs:   . Lack of  Transportation (Medical): Not on file  . Lack of Transportation (Non-Medical): Not on file  Physical Activity:   . Days of Exercise per Week: Not on file  . Minutes of Exercise per Session: Not on file  Stress:   . Feeling of Stress : Not on file  Social Connections:   . Frequency of Communication with Friends and Family: Not on file  . Frequency of Social Gatherings with Friends and Family: Not on file  . Attends Religious Services: Not on file  . Active Member of Clubs or Organizations: Not on file  . Attends Archivist Meetings: Not on file  . Marital Status: Not on file   Family History  Problem Relation Age of Onset  . Diabetes Brother   . Cancer Other   . Hypertension Other    Scheduled Meds: . chlorhexidine  15 mL Mouth Rinse BID  . Chlorhexidine Gluconate Cloth  6 each Topical Daily  . citalopram  20 mg Oral Daily  . lactulose  10 g Oral BID  . mouth rinse  15 mL Mouth Rinse q12n4p  . pantoprazole (PROTONIX) IV  40 mg Intravenous Q12H  . sodium chloride (PF)      . Tbo-Filgrastim  480 mcg Subcutaneous q1800   Continuous Infusions: . azithromycin Stopped (10/15/19 1239)  . octreotide  (SANDOSTATIN)    IV infusion 50 mcg/hr (10/16/19 0800)   PRN Meds:.acetaminophen **OR** acetaminophen, diclofenac Sodium, lidocaine, lip balm, ondansetron **OR**  ondansetron (ZOFRAN) IV, senna-docusate Medications Prior to Admission:  Prior to Admission medications   Medication Sig Start Date End Date Taking? Authorizing Provider  acidophilus (RISAQUAD) CAPS capsule Take 1 capsule by mouth daily.   Yes [provider]  Apoaequorin (PREVAGEN EXTRA STRENGTH) 20 MG CAPS Take 20 mg by mouth daily.   Yes [provider]  Ascorbic Acid (VITAMIN C) 1000 MG tablet Take 1,000 mg by mouth daily.   Yes [provider]  cholecalciferol (VITAMIN D) 1000 units tablet Take 1,000 Units by mouth daily.   Yes [provider]  Cinnamon 500 MG capsule Take 500 mg by mouth daily.    Yes [provider]  citalopram (CELEXA) 40 MG tablet TAKE 1 TABLET BY MOUTH EVERY DAY Patient taking differently: Take 40 mg by mouth.  05/17/19  Yes Pickard, Cammie Mcgee, MD  fexofenadine (ALLEGRA) 180 MG tablet Take 180 mg by mouth daily.   Yes [provider]  Ginkgo Biloba 60 MG TABS Take 60 mg by mouth daily.    Yes [provider]  lactulose (CHRONULAC) 10 GM/15ML solution TAKE 30 MLS BY MOUTH THREE TIMES DAILY Patient taking differently: Take 20 g by mouth daily.  12/22/18  Yes [name], MD  losartan (COZAAR) 25 MG tablet Take 25 mg by mouth daily.   Yes [provider]  losartan (COZAAR) 25 MG tablet TAKE 1 TABLET(25 MG) BY MOUTH DAILY Patient taking differently: Take 25 mg by mouth daily.  09/05/19  Yes [name], MD  magic mouthwash w/lidocaine SOLN Take 5 mLs by mouth 3 (three) times daily as needed for mouth pain. 08/15/19  Yes [place], Holli Humbles, NP  Multiple Vitamin (MULTIVITAMIN WITH MINERALS) TABS tablet Take 1 tablet by mouth daily.   Yes [provider]  omega-3 acid ethyl esters (LOVAZA) 1 g capsule Take 1 g by mouth daily.   Yes [provider]  pantoprazole (PROTONIX) 40 MG tablet TAKE 1 TABLET(40 MG) BY MOUTH DAILY Patient taking differently: Take 40  mg by mouth daily.   03/11/19  Yes [name], MD  spironolactone (ALDACTONE) 50 MG tablet TAKE 1 TABLET(50 MG) BY MOUTH DAILY Patient taking differently: Take 50 mg by mouth daily.  08/19/19  Yes Ennever, Rudell Cobb, MD  vitamin B-12 (CYANOCOBALAMIN) 1000 MCG tablet Take 1,000 mcg by mouth daily.   Yes [provider]  vitamin E 400 UNIT capsule Take 400 Units by mouth daily.   Yes [provider]  GENERLAC 10 GM/15ML SOLN TAKE 30 MLS BY MOUTH THREE TIMES DAILY Patient not taking: Reported on 10/12/2019 08/10/17   [name], MD   Allergies  Allergen Reactions  . Bee Venom Anaphylaxis  . Codeine Anaphylaxis and Rash  . Morphine And Related Anaphylaxis and Rash  . Phytonadione Other (See Comments)    Pt experienced an episode of cyanosis following dose of Vitamin K 10 mg IV.  Marland Kitchen Penicillins Rash and Other (See Comments)    Has patient had a PCN reaction causing immediate rash, facial/tongue/throat swelling, SOB or lightheadedness with hypotension: No Has patient had a PCN reaction causing severe rash involving mucus membranes or skin necrosis: No Has patient had a PCN reaction that required hospitalization: No Has patient had a PCN reaction occurring within the last 10 years: No If all of the above answers are "NO", then may proceed with Cephalosporin use.  . Promethazine Hcl Other (See Comments)    Reaction:  Unknown    Review of Systems Does not verbalize, appears in no distress Physical Exam Well-developed, well-nourished however appears with generalized weakness Appears chronically ill Diminished breath sounds S1-S2 Abdomen has a ostomy, nondistended Does not have edema Awakens some but does not verbalize or respond  Vital Signs: BP (!) 144/45 (BP Location: Right Arm)   Pulse (!) 52 Comment: chronic for pt  Temp 98.9 F (37.2 C)   Resp 18   Ht 5' 3"  (1.6 m)   Wt 72.6 kg   SpO2 93%   BMI 28.34 kg/m  Pain Scale: PAINAD   Pain Score: Asleep   SpO2: SpO2: 93  % O2 Device:SpO2: 93 % O2 Flow Rate: .O2 Flow Rate (L/min): 1 L/min  IO: Intake/output summary:   Intake/Output Summary (Last 24 hours) at 10/16/2019 1005 Last data filed at 10/16/2019 0800 Gross per 24 hour  Intake 812.78 ml  Output 750 ml  Net 62.78 ml    LBM: Last BM Date: 10/16/19 Baseline Weight: Weight: 72.6 kg Most recent weight: Weight: 72.6 kg     Palliative Assessment/Data:   Palliative performance scale 30%  Time In:  9 Time Out:10   Time Total: 60  Greater than 50%  of this time was spent counseling and coordinating care related to the above assessment and plan.  Signed by: Loistine Chance, MD   Please contact Palliative Medicine Team phone at [phone] for questions and concerns.  For individual provider: See Shea Evans

## 2019-10-16 NOTE — Plan of Care (Signed)
  Problem: Clinical Measurements: Goal: Respiratory complications will improve Outcome: Progressing   Problem: Bowel/Gastric: Goal: Will show no signs and symptoms of gastrointestinal bleeding Outcome: Progressing   Problem: Education: Goal: Ability to identify signs and symptoms of gastrointestinal bleeding will improve Outcome: Not Progressing

## 2019-10-17 DIAGNOSIS — K7030 Alcoholic cirrhosis of liver without ascites: Secondary | ICD-10-CM

## 2019-10-17 LAB — CBC WITH DIFFERENTIAL/PLATELET
Abs Immature Granulocytes: 0.62 10*3/uL — ABNORMAL HIGH (ref 0.00–0.07)
Basophils Absolute: 0 10*3/uL (ref 0.0–0.1)
Basophils Relative: 0 %
Eosinophils Absolute: 0.1 10*3/uL (ref 0.0–0.5)
Eosinophils Relative: 1 %
HCT: 28.3 % — ABNORMAL LOW (ref 36.0–46.0)
Hemoglobin: 9 g/dL — ABNORMAL LOW (ref 12.0–15.0)
Immature Granulocytes: 6 %
Lymphocytes Relative: 7 %
Lymphs Abs: 0.8 10*3/uL (ref 0.7–4.0)
MCH: 31.4 pg (ref 26.0–34.0)
MCHC: 31.8 g/dL (ref 30.0–36.0)
MCV: 98.6 fL (ref 80.0–100.0)
Monocytes Absolute: 0.9 10*3/uL (ref 0.1–1.0)
Monocytes Relative: 9 %
Neutro Abs: 8.3 10*3/uL — ABNORMAL HIGH (ref 1.7–7.7)
Neutrophils Relative %: 77 %
Platelets: 35 10*3/uL — ABNORMAL LOW (ref 150–400)
RBC: 2.87 MIL/uL — ABNORMAL LOW (ref 3.87–5.11)
RDW: 17.2 % — ABNORMAL HIGH (ref 11.5–15.5)
WBC: 10.8 10*3/uL — ABNORMAL HIGH (ref 4.0–10.5)
nRBC: 1.1 % — ABNORMAL HIGH (ref 0.0–0.2)

## 2019-10-17 LAB — COMPREHENSIVE METABOLIC PANEL
ALT: 20 U/L (ref 0–44)
AST: 30 U/L (ref 15–41)
Albumin: 2.3 g/dL — ABNORMAL LOW (ref 3.5–5.0)
Alkaline Phosphatase: 74 U/L (ref 38–126)
Anion gap: 7 (ref 5–15)
BUN: 19 mg/dL (ref 8–23)
CO2: 21 mmol/L — ABNORMAL LOW (ref 22–32)
Calcium: 8.9 mg/dL (ref 8.9–10.3)
Chloride: 111 mmol/L (ref 98–111)
Creatinine, Ser: 0.79 mg/dL (ref 0.44–1.00)
GFR calc Af Amer: >60 mL/min (ref >60)
GFR calc non Af Amer: >60 mL/min (ref >60)
Glucose, Bld: 86 mg/dL (ref 70–99)
Potassium: 3.8 mmol/L (ref 3.5–5.1)
Sodium: 139 mmol/L (ref 135–145)
Total Bilirubin: 2.3 mg/dL — ABNORMAL HIGH (ref 0.3–1.2)
Total Protein: 4.4 g/dL — ABNORMAL LOW (ref 6.5–8.1)

## 2019-10-17 LAB — CULTURE, BLOOD (ROUTINE X 2)
Culture: NO GROWTH
Special Requests: ADEQUATE

## 2019-10-17 LAB — PROTIME-INR
INR: 2 — ABNORMAL HIGH (ref 0.8–1.2)
Prothrombin Time: 22.2 seconds — ABNORMAL HIGH (ref 11.4–15.2)

## 2019-10-17 MED ORDER — LIDOCAINE 5 % EX OINT: TOPICAL_OINTMENT | Freq: Three times a day (TID) | CUTANEOUS | 0 refills | Status: DC | PRN

## 2019-10-17 NOTE — Care Management Important Message (Signed)
Important Message  Patient Details IM Letter given to Marney Doctor RN Case Manager to present to the Patient Name: [name] MRN: [id number] Date of Birth: October 16, 1940   Medicare Important Message Given:  Yes     Kerin Salen 10/17/2019, 9:35 AM

## 2019-10-17 NOTE — Progress Notes (Signed)
Physical Therapy Treatment Patient Details Name: [name] MRN: [id number] DOB: 08/27/1941 Today's Date: 10/17/2019    History of Present Illness 79 y o female was admitted with rectal bleeding and acute respiratory failure with hypoxia.  PMH:  Advanced dementia.  colon and bladder CA, ileostomy, and non alcoholic cirrohosis    PT Comments    Pt continues to require +2 assist for mobility. Husband was present during session. Plan is for d/c home.    Follow Up Recommendations  Home health PT;Supervision/Assistance - 24 hour     Equipment Recommendations  None recommended by PT    Recommendations for Other Services       Precautions / Restrictions Precautions Precautions: Fall Restrictions Weight Bearing Restrictions: No    Mobility  Bed Mobility Overal bed mobility: Needs Assistance Bed Mobility: Supine to Sit     Supine to sit: Mod assist;+2 for physical assistance;+2 for safety/equipment;HOB elevated     General bed mobility comments: Increased time. Assist for trunk, LEs, and scooting to EOB. Utilized bedpad to assist with scooting. Multimodal, repeated cueing required  Transfers Overall transfer level: Needs assistance Equipment used: Rolling walker (2 wheeled) Transfers: Sit to/from Stand Sit to Stand: Mod assist;+2 physical assistance;+2 safety/equipment         General transfer comment: Assist to power up, encourage extension at sternum and hips, controlled descent (pt attempts to sit before safely positioned). Increased time. Multimodal, repeated cueing required.  Ambulation/Gait Ambulation/Gait assistance: Min assist;+2 physical assistance;+2 safety/equipment Gait Distance (Feet): 50 Feet Assistive device: Rolling walker (2 wheeled) Gait Pattern/deviations: Step-to pattern;Step-through pattern;Trunk flexed     General Gait Details: Assist to stabilize pt and manage RW. Pt started off well but appeared to fatigue as time progressed. Fall  risk.   Stairs             Wheelchair Mobility    Modified Rankin (Stroke Patients Only)       Balance Overall balance assessment: Needs assistance         Standing balance support: Bilateral upper extremity supported Standing balance-Leahy Scale: Poor                              Cognition Arousal/Alertness: Awake/alert Behavior During Therapy: WFL for tasks assessed/performed Overall Cognitive Status: History of cognitive impairments - at baseline                                 General Comments: follows commands with increased time/repeated cueing      Exercises      General Comments        Pertinent Vitals/Pain Pain Assessment: Faces Faces Pain Scale: Hurts a little bit Pain Location: buttocks Pain Descriptors / Indicators: Discomfort;Sore Pain Intervention(s): Limited activity within patient's tolerance;Monitored during session;Repositioned    Home Living                      Prior Function            PT Goals (current goals can now be found in the care plan section) Progress towards PT goals: Progressing toward goals    Frequency    Min 3X/week      PT Plan Current plan remains appropriate    Co-evaluation              AM-PAC PT "6 Clicks" Mobility   Outcome  Measure  Help needed turning from your back to your side while in a flat bed without using bedrails?: A Lot Help needed moving from lying on your back to sitting on the side of a flat bed without using bedrails?: A Lot Help needed moving to and from a bed to a chair (including a wheelchair)?: A Lot Help needed standing up from a chair using your arms (e.g., wheelchair or bedside chair)?: A Lot Help needed to walk in hospital room?: A Lot Help needed climbing 3-5 steps with a railing? : Total 6 Click Score: 11    End of Session Equipment Utilized During Treatment: Gait belt Activity Tolerance: Patient limited by fatigue;Patient  tolerated treatment well Patient left: in chair;with call bell/phone within reach;with family/visitor present;with chair alarm set   PT Visit Diagnosis: Muscle weakness (generalized) (M62.81);Difficulty in walking, not elsewhere classified (R26.2)     Time: 4718-5501 PT Time Calculation (min) (ACUTE ONLY): 24 min  Charges:  $Gait Training: 8-22 mins $Therapeutic Activity: 8-22 mins                       Doreatha Massed, PT Acute Rehabilitation

## 2019-10-17 NOTE — Discharge Summary (Signed)
Discharge Summary  [name] MBT:[id number] DOB: 29-Dec-1940  PCP: [name], MD  Admit date: 10/12/2019 Discharge date: 10/17/2019  Time spent: 40 mins   Recommendations for Outpatient Follow-up:  1. PCP in 1 week with repeat labs 2. GI as scheduled  Discharge Diagnoses:  Active Hospital Problems   Diagnosis Date Noted  . Acute respiratory failure with hypoxia ([place]) 10/12/2019  . Community acquired pneumonia   . Essential hypertension 09/11/2016  . Sepsis ([place]) 02/28/2014  . Dementia without behavioral disturbance ([place]) 02/26/2012  . Thrombocytopenia ([place]) 08/26/2011  . Bladder cancer [facility]) 08/26/2011    Resolved Hospital Problems  No resolved problems to display.    Discharge Condition: Stable  Diet recommendation: As tolerated  Vitals:   10/16/19 2257 10/17/19 0507  BP: (!) 130/59 (!) 119/47  Pulse: (!) 56 (!) 57  Resp: 20 16  Temp: 98.3 F (36.8 C) 98.2 F (36.8 C)  SpO2: 92% 94%    History of present illness:  Latanya Hemmer Dobbinsis a 79 y.o.femalewith medical history significant ofadvanced dementia, depression, essential hypertension, GERD,and bladder cancer status post ostomy who presented to the ED via EMS for worsening confusion, decreased oral intake and progressive weakness. Patient with history advanced dementia and husband at bedside gives majority of the HPI. Husband reports that her dementia has slowly advanced over the last 2 years, but at baseline she is able to ambulate with the use of a walker. Currently lives at home with husband with occasional outside support. In the ED, VS showed: Temp of  100.8, HR 63, RR 20, BP 99/88, SPO2 95% on 2 L nasal cannula. WBC count 2.5, hemoglobin 10.2, platelet count 26, BUN 29, creatinine 0.90, Lactic acid 4.0. Covid-19/SARS-CoV-2 PCR negative. Rapid influenza negative. INR 1.8. Chest x-ray with right lower base density. Patient was given a 2 L NS bolus with azithromycin and ceftriaxone. ED  physician referred for admission for acute hypoxic respiratory failure/sepsis likely secondary to community-acquired pneumonia.     Today, met patient sleeping, easily arousable, pleasantly confused.  Looked comfortable, denies any new complaints.  Noted to have had a bowel movement.  Patient stable for discharge with close follow-up with PCP, GI.  Husband refusing SNF at the moment, wants patient home.  Home health PT/OT/RN/aide/social worker ordered   Hospital Course:  Principal Problem:   Acute respiratory failure with hypoxia (HCC) Active Problems:   Bladder cancer (HCC)   Thrombocytopenia ([place])   Dementia without behavioral disturbance ([place])   Sepsis ([place])   Essential hypertension   Community acquired pneumonia  Acute hypoxic respiratory failure likely 2/2 CAP Sepsis, present on admission Remains afebrile Lactic acid of 4.0-->2.4-->1.5 BC X 2 NGTD Procalcitonin trended downwards 17--> 19-->13 Covid-19/SARS-CoV-2 PCR negative. 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 Acute blood loss anemia Bleeding per rectum has now resolved Transfused 2 units of PRBC, as well as 2 units of platelets on 10/13/19, 2 units of platelets/FFP on 10/14/2019 D/C IV octreotide on 10/16/19, continue Protonix GI consulted, noted large hemorrhoids, not a candidate for endoscopy due to thrombocytopenia, continue lactulose, lidocaine ointment for external hemorrhoids CT abdomen/pelvis showed multifocal nonspecific bowel wall thickening, cirrhosis with portal hypertension, splenomegaly, paraesophageal varices, 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 daily  GERD Continue PPI  Hxbladder cancer status post urostomy  Remote history of stage II adenocarcinoma of the colon  GOC Very poor prognosis Palliative care on board, plan for outpatient palliative           Malnutrition Type:  Nutrition Problem: Inadequate oral intake Etiology: lethargy/confusion   Malnutrition Characteristics:  Signs/Symptoms: per patient/family report   Nutrition Interventions:  Interventions: Refer to RD note for recommendations   Estimated body mass index is 39.76 kg/m as calculated from the following:   Height as of this encounter: 5' 3"  (1.6 m).   Weight as of this encounter: 101.8 kg.    Procedures:  None  Consultations:  GI  Heme-onc  Palliative  Discharge Exam: BP (!) 119/47 (BP Location: Right Arm)   Pulse (!) 57   Temp 98.2 F (36.8 C) (Oral)   Resp 16   Ht 5' 3"  (1.6 m)   Wt 101.8 kg   SpO2 94%   BMI 39.76 kg/m   General: NAD, pleasantly confused Cardiovascular: S1, S2 present Respiratory: Diminished breath sounds bilaterally  Discharge Instructions You were cared for by a hospitalist during your hospital stay. If you have any questions about your discharge medications or the care you received while you were in the hospital after you are discharged, you can call the unit and asked to speak with the hospitalist on call if the hospitalist that took care of you is not available. Once you are discharged, your primary care physician will handle any further medical issues. Please note that NO REFILLS for any discharge medications will  be authorized once you are discharged, as it is imperative that you return to your primary care physician (or establish a relationship with a primary care physician if you do not have one) for your aftercare needs so that they can reassess your need for medications and monitor your lab values.  Discharge Instructions    Diet - low sodium heart healthy   Complete by: As directed    Increase activity slowly   Complete by: As directed      Allergies as of 10/17/2019      Reactions   Bee Venom Anaphylaxis   Codeine Anaphylaxis, Rash   Morphine And Related Anaphylaxis, Rash   Phytonadione Other (See Comments)   Pt experienced an episode of cyanosis following dose of Vitamin K 10 mg IV.   Penicillins Rash, Other (See Comments)   Has patient had a PCN reaction causing immediate rash, facial/tongue/throat swelling, SOB or lightheadedness with hypotension: No Has patient had a PCN reaction causing severe rash involving mucus membranes or skin necrosis: No Has patient had a PCN reaction that required hospitalization: No Has patient had a PCN reaction occurring within the last 10 years: No If all of the above answers are "NO", then may proceed with Cephalosporin use.   Promethazine Hcl Other (See Comments)   Reaction:  Unknown       Medication List    STOP taking these medications   Generlac 10 GM/15ML Soln Generic drug: lactulose (encephalopathy)     TAKE these medications   acidophilus Caps capsule Take 1 capsule by mouth daily.  cholecalciferol 1000 units tablet Commonly known as: VITAMIN D Take 1,000 Units by mouth daily.   Cinnamon 500 MG capsule Take 500 mg by mouth daily.   citalopram 40 MG tablet Commonly known as: CELEXA TAKE 1 TABLET BY MOUTH EVERY DAY What changed: when to take this   fexofenadine 180 MG tablet Commonly known as: ALLEGRA Take 180 mg by mouth daily.   Ginkgo Biloba 60 MG Tabs Take 60 mg by mouth daily.   lactulose 10 GM/15ML solution Commonly  known as: CHRONULAC TAKE 30 MLS BY MOUTH THREE TIMES DAILY What changed: See the new instructions.   lidocaine 5 % ointment Commonly known as: XYLOCAINE Apply topically 3 (three) times daily as needed (For hemorrhoids).   losartan 25 MG tablet Commonly known as: COZAAR TAKE 1 TABLET(25 MG) BY MOUTH DAILY What changed:   how much to take  how to take this  when to take this  additional instructions  Another medication with the same name was removed. Continue taking this medication, and follow the directions you see here.   magic mouthwash w/lidocaine Soln Take 5 mLs by mouth 3 (three) times daily as needed for mouth pain.   multivitamin with minerals Tabs tablet Take 1 tablet by mouth daily.   omega-3 acid ethyl esters 1 g capsule Commonly known as: LOVAZA Take 1 g by mouth daily.   pantoprazole 40 MG tablet Commonly known as: PROTONIX TAKE 1 TABLET(40 MG) BY MOUTH DAILY What changed: See the new instructions.   Prevagen Extra Strength 20 MG Caps Generic drug: Apoaequorin Take 20 mg by mouth daily.   spironolactone 50 MG tablet Commonly known as: ALDACTONE TAKE 1 TABLET(50 MG) BY MOUTH DAILY What changed: See the new instructions.   vitamin B-12 1000 MCG tablet Commonly known as: CYANOCOBALAMIN Take 1,000 mcg by mouth daily.   vitamin C 1000 MG tablet Take 1,000 mg by mouth daily.   vitamin E 180 MG (400 UNITS) capsule Take 400 Units by mouth daily.      Allergies  Allergen Reactions  . Bee Venom Anaphylaxis  . Codeine Anaphylaxis and Rash  . Morphine And Related Anaphylaxis and Rash  . Phytonadione Other (See Comments)    Pt experienced an episode of cyanosis following dose of Vitamin K 10 mg IV.  Marland Kitchen Penicillins Rash and Other (See Comments)    Has patient had a PCN reaction causing immediate rash, facial/tongue/throat swelling, SOB or lightheadedness with hypotension: No Has patient had a PCN reaction causing severe rash involving mucus membranes or  skin necrosis: No Has patient had a PCN reaction that required hospitalization: No Has patient had a PCN reaction occurring within the last 10 years: No If all of the above answers are "NO", then may proceed with Cephalosporin use.  . Promethazine Hcl Other (See Comments)    Reaction:  Unknown    Follow-up Information    [facility] Follow up.   Specialty: [place] Why: For home health services Contact information: [place] [address] [phone]        [name], MD. Schedule an appointment as soon as possible for a visit in 1 week(s).   Specialty: Family Medicine Contact information: [address] [place] [address] [phone]        Ronald Lobo, MD. Schedule an appointment as soon as possible for a visit in 1 week(s).   Specialty: Gastroenterology Contact information: [address]  [place] [address] [phone]            The results of significant diagnostics from this hospitalization (including imaging, microbiology, ancillary and laboratory) are listed below for reference.    Significant Diagnostic Studies: CT ABDOMEN PELVIS W CONTRAST  Result Date: 10/16/2019 CLINICAL DATA:  GI bleed. Pancytopenia/liver cirrhosis with coli Juliette Alcide. Multiple episodes of bleeding per rectum. EXAM: CT ABDOMEN AND PELVIS WITH CONTRAST TECHNIQUE: Multidetector CT imaging of the abdomen and pelvis was performed using the standard protocol following bolus administration of intravenous contrast. CONTRAST:  156m OMNIPAQUE IOHEXOL 300 MG/ML  SOLN COMPARISON:  CT 04/06/2014, MRI 04/02/2016. Most recent ultrasound 05/09/2019 FINDINGS: Lower chest: Small to moderate bilateral pleural effusions and adjacent compressive atelectasis. Mild cardiomegaly. Wall thickening of the distal esophagus. There are paraesophageal varices. Hepatobiliary: Nodular hepatic contours consistent with cirrhosis.  7 mm hypodense lesion in the inferior right lobe of the liver series 2, image 21, unchanged from prior. Left lobe liver lesion on prior MRI is not definitively seen. Postcholecystectomy. Partially occlusive thrombus in the portal splenic confluence is again seen. There is likely thrombus in the main portal vein, series 5, image 82. No flow was demonstrated on most recent ultrasound. Pancreas: Parenchymal atrophy. No ductal dilatation or inflammation. Spleen: Splenomegaly with spleen measuring 18.7 x 8.0 x 16.3 cm (volume = 1300 cm^3). No focal splenic abnormality. Adrenals/Urinary Tract: Normal adrenal glands. Mild right hydronephrosis. No evidence of obstructing stone. Right ureter is decompressed. Mild left cortical scarring in the upper pole. No left hydronephrosis. Previous left renal stones are no longer seen. Low-density lesion in the upper left kidney likely cyst. Small exophytic cyst in the lower left kidney. The bladder is not visualized, patient with history of cystostomy per history. Right lower quadrant urostomy. There is absent excretion on delayed phase imaging suggesting underlying renal dysfunction. Stomach/Bowel: Distal esophageal wall thickening with paraesophageal varices. There perigastric varices. Stomach is decompressed. Mild esophageal wall thickening, may be accentuated by ascites. Wall thickening of the proximal small bowel, for example series 2, image 39. More distal small bowel appears normal. Administered enteric contrast reaches the colon. No obstruction. There is colonic wall thickening from the mid transverse through the mid descending. Possible wall thickening of the distal sigmoid colon and rectum. No evidence of free air or perforation. No bowel pneumatosis. Colonic diverticulosis without diverticulitis. Post appendectomy. Vascular/Lymphatic: Chronic partially occlusive thrombus in the portal splenic confluence. Suspected portal vein occlusion. Left upper quadrant collaterals with  splenorenal shunting. Aortic atherosclerosis without aneurysm. No definite enlarged lymph nodes in the abdomen or pelvis. Reproductive: Status post hysterectomy. No adnexal masses. Other: Small moderate volume upper abdominal and mesenteric ascites. Mild generalized mesenteric fat stranding and edema. There is generalized subcutaneous soft tissue edema. Postsurgical change of the anterior abdominal wall. Right lower quadrant urostomy. Musculoskeletal: Bones are diffusely under mineralized. There is degenerative change in the spine. There are no acute or suspicious osseous abnormalities. IMPRESSION: 1. Multifocal nonspecific bowel wall thickening, any of which may be site of GI bleeding. There is wall thickening of the duodenum, proximal small bowel, mid transverse through mid descending colon. There is also wall thickening of the distal sigmoid colon and rectum. 2. Cirrhosis with portal hypertension, splenomegaly, and paraesophageal varices. 3. Chronic partially occlusive thrombus in the portal splenic confluence. Probable thrombus in the main portal vein, no portal venous flow was demonstrated on most recent ultrasound. 4. Small to moderate bilateral pleural effusions and adjacent compressive atelectasis. Small to moderate volume ascites  in the abdomen generalized mesenteric edema. 5. Mild right hydronephrosis without obstructing stone or cause for obstruction. Absent excretion on delayed phase imaging suggesting underlying renal dysfunction. 6.  Aortic Atherosclerosis (ICD10-I70.0). Electronically Signed   By: Keith Rake M.D.   On: 10/16/2019 02:56   DG Chest Port 1 View  Result Date: 10/12/2019 CLINICAL DATA:  Sepsis EXAM: PORTABLE CHEST 1 VIEW COMPARISON:  12/16/2018 FINDINGS: Cardiac enlargement without heart failure.  Left lung clear. Wispy density in the right lung base appears to have progressed in the interval. No effusion. IMPRESSION: Wispy density in the right lung base may represent  atelectasis, scarring or possibly pneumonia. Follow-up recommended. Electronically Signed   By: Franchot Gallo M.D.   On: 10/12/2019 10:47    Microbiology: Recent Results (from the past 240 hour(s))  Culture, blood (Routine x 2)     Status: None   Collection Time: 10/12/19 10:12 AM   Specimen: BLOOD  Result Value Ref Range Status   Specimen Description   Final    BLOOD BLOOD LEFT FOREARM Performed at [facility] [address], [place], [place] [address]    Special Requests   Final    BOTTLES DRAWN AEROBIC AND ANAEROBIC Blood Culture adequate volume Performed at [facility] [address] [place], [place] [address]    Culture   Final    NO GROWTH 5 DAYS Performed at [facility], [place] [address] [place], [place] [address]    Report Status 10/17/2019 FINAL  Final  Respiratory Panel by RT PCR (Flu A&B, Covid) - Nasopharyngeal Swab     Status: None   Collection Time: 10/12/19 10:12 AM   Specimen: Nasopharyngeal Swab  Result Value Ref Range Status   SARS Coronavirus 2 by RT PCR NEGATIVE NEGATIVE Final    Comment: (NOTE) SARS-CoV-2 target nucleic acids are NOT DETECTED. The SARS-CoV-2 RNA is generally detectable in upper respiratoy specimens during the acute phase of infection. The lowest concentration of SARS-CoV-2 viral copies this assay can detect is 131 copies/mL. A negative result does not preclude SARS-Cov-2 infection and should not be used as the sole basis for treatment or other patient management decisions. A negative result may occur with  improper specimen collection/handling, submission of specimen other than nasopharyngeal swab, presence of viral mutation(s) within the areas targeted by this assay, and inadequate number of viral copies (<131 copies/mL). A negative result must be combined with clinical observations, patient history, and epidemiological information. The expected result is Negative. Fact Sheet for  Patients:  PinkCheek.be Fact Sheet for Healthcare Providers:  GravelBags.it This test is not yet ap proved or cleared by the [place] FDA and  has been authorized for detection and/or diagnosis of SARS-CoV-2 by FDA under an Emergency Use Authorization (EUA). This EUA will remain  in effect (meaning this test can be used) for the duration of the COVID-19 declaration under Section 564(b)(1) of the Act, 21 U.S.C. section 360bbb-3(b)(1), unless the authorization is terminated or revoked sooner.    Influenza A by PCR NEGATIVE NEGATIVE Final   Influenza B by PCR NEGATIVE NEGATIVE Final    Comment: (NOTE) The Xpert Xpress SARS-CoV-2/FLU/RSV assay is intended as an aid in  the diagnosis of influenza from Nasopharyngeal swab specimens and  should not be used as a sole basis for treatment. Nasal washings and  aspirates are unacceptable for Xpert Xpress SARS-CoV-2/FLU/RSV  testing. Fact Sheet for Patients: PinkCheek.be Fact Sheet for Healthcare Providers: GravelBags.it This test is not  yet approved or cleared by the [place] and  has been authorized for detection and/or diagnosis of SARS-CoV-2 by  FDA under an Emergency Use Authorization (EUA). This EUA will remain  in effect (meaning this test can be used) for the duration of the  Covid-19 declaration under Section 564(b)(1) of the Act, 21  U.S.C. section 360bbb-3(b)(1), unless the authorization is  terminated or revoked. Performed at [facility], [place] [address] [place], [place] [address]   Urine culture     Status: Abnormal   Collection Time: 10/12/19 12:18 PM   Specimen: Urine, Catheterized  Result Value Ref Range Status   Specimen Description   Final    URINE, CATHETERIZED Performed at [facility] [address], [place], [place] [address]    Special Requests    Final    NONE Performed at [facility], [place] [address], [place], [place] [address]    Culture MULTIPLE SPECIES PRESENT, SUGGEST RECOLLECTION (A)  Final   Report Status 10/14/2019 FINAL  Final  Culture, blood (Routine x 2)     Status: None (Preliminary result)   Collection Time: 10/12/19  7:05 PM   Specimen: BLOOD RIGHT HAND  Result Value Ref Range Status   Specimen Description   Final    BLOOD RIGHT HAND Performed at [facility] [address] [place], [place] [address]    Special Requests   Final    BOTTLES DRAWN AEROBIC ONLY Blood Culture adequate volume Performed at [facility] [address], [place], [place] [address]    Culture   Final    NO GROWTH 4 DAYS Performed at [facility], [place] [address] [place],  [address]    Report Status PENDING  Incomplete  MRSA PCR Screening     Status: None   Collection Time: 10/14/19  8:16 PM   Specimen: Nasal Mucosa; Nasopharyngeal  Result Value Ref Range Status   MRSA by PCR NEGATIVE NEGATIVE Final    Comment:        The GeneXpert MRSA Assay (FDA approved for NASAL specimens only), is one component of a comprehensive MRSA colonization surveillance program. It is not intended to diagnose MRSA infection nor to guide or monitor treatment for MRSA infections. Performed at [facility], [place] [address] [place],  [address]      Labs: Basic Metabolic Panel: Recent Labs  Lab 10/13/19 0600 10/14/19 0134 10/15/19 0222 10/16/19 0555 10/17/19 0516  NA 139 143 140 139 139  K 4.9 4.5 3.8 4.1 3.8  CL 110 115* 111 109 111  CO2 19* 22 22 24  21*  GLUCOSE 126* 125* 78 101* 86  BUN 36* 42* 37* 28* 19  CREATININE 1.06* 0.90 0.89 0.85 0.79  CALCIUM 9.1 9.0 9.0 8.9 8.9   Liver Function Tests: Recent Labs  Lab 10/12/19 1012 10/14/19 0134 10/15/19 0222 10/16/19 0555 10/17/19 0516  AST 29 41 35 30 30  ALT 18 23 22 21 20   ALKPHOS 64  43 49 56 74  BILITOT 2.9* 1.8* 1.5* 2.6* 2.3*  PROT 5.5* 4.8* 4.9* 4.8* 4.4*  ALBUMIN 2.9* 2.4* 2.6* 2.5* 2.3*   No results for input(s): LIPASE, AMYLASE in the last 168 hours. No results for input(s): AMMONIA in the last 168 hours. CBC: Recent Labs  Lab 10/12/19 1012 10/13/19 0600 10/14/19 0134 10/14/19 1648 10/15/19 0222 10/16/19 0555 10/17/19 0516  WBC 2.5*   < > 1.3* 1.7* 3.3*  7.7 10.8*  NEUTROABS 2.2  --  0.7*  --  2.3 4.8 8.3*  HGB 10.2*   < > 8.5* 8.6* 9.1* 9.4* 9.0*  HCT 31.5*   < > 25.9* 28.1* 28.1* 28.7* 28.3*  MCV 96.9   < > 96.6 99.6 96.9 97.3 98.6  PLT 26*   < > 30* 40* 46* 36* 35*   < > = values in this interval not displayed.   Cardiac Enzymes: No results for input(s): CKTOTAL, CKMB, CKMBINDEX, TROPONINI in the last 168 hours. BNP: BNP (last 3 results) No results for input(s): BNP in the last 8760 hours.  ProBNP (last 3 results) No results for input(s): PROBNP in the last 8760 hours.  CBG: No results for input(s): GLUCAP in the last 168 hours.     Signed:  Alma Friendly, MD [facility] 10/17/2019, 10:54 AM

## 2019-10-17 NOTE — TOC Initial Note (Signed)
Transition of Care [facility]) - Initial/Assessment Note    Patient Details  Name: [name] MRN: [id number] Date of Birth: 10/13/1940  [facility] CM/SW Contact:    Lynnell Catalan, RN Phone Number: 10/17/2019, 11:11 AM  Clinical Narrative:                 This CM spoke with pt and husband at bedside. Husband states that he wants to take pt back home and he has a caregiver 3x's a week for 4 hours. Husband states he would like home health services in addition to caregivers. Husband offered choice for home health services and [facility] was chosen.  Expected Discharge Plan: [place] Barriers to Discharge: No Barriers Identified   Patient Goals and CMS Choice   CMS Medicare.gov Compare Post Acute Care list provided to:: Patient Represenative (must comment)(Husband) Choice offered to / list presented to : Spouse  Expected Discharge Plan and Services Expected Discharge Plan: [place]   Discharge Planning Services: CM Consult Post Acute Care Choice: [place] arrangements for the past 2 months: [place] Expected Discharge Date: 10/17/19                         HH Arranged: RN, PT, OT, Nurse's Aide, Social Work [employer] Agency: [place] Date [facility] Agency Contacted: 10/17/19 Time HH Agency Contacted: 1100 Representative spoke with at [place] Arrangements/Services Living arrangements for the past 2 months: [place] with:: Spouse          Need for Family Participation in Patient Care: Yes (Comment) Care giver support system in place?: Yes (comment) Current home services: Homehealth aide Criminal Activity/Legal Involvement Pertinent to Current Situation/Hospitalization: No - Comment as needed  Activities of Daily Living Home Assistive Devices/Equipment: Bedside commode/3-in-1, Cane (specify quad or straight), Environmental consultant (specify type), Wheelchair, Other (Comment), Grab  bars in shower, Shower chair without back, Dentures (specify type), Ostomy supplies(upper denture, single point cane, 4 wheeled walker, ramp entrance, urostomy) ADL Screening (condition at time of admission) Patient's cognitive ability adequate to safely complete daily activities?: No Is the patient deaf or have difficulty hearing?: No Does the patient have difficulty seeing, even when wearing glasses/contacts?: No Does the patient have difficulty concentrating, remembering, or making decisions?: Yes Patient able to express need for assistance with ADLs?: No Does the patient have difficulty dressing or bathing?: Yes Independently performs ADLs?: No Communication: Independent Dressing (OT): Needs assistance Is this a change from baseline?: Pre-admission baseline Grooming: Needs assistance Is this a change from baseline?: Pre-admission baseline Feeding: Needs assistance Is this a change from baseline?: Pre-admission baseline Bathing: Needs assistance Is this a change from baseline?: Pre-admission baseline Toileting: Dependent Is this a change from baseline?: Change from baseline, expected to last >3days In/Out Bed: Dependent Is this a change from baseline?: Change from baseline, expected to last >3 days Walks in Home: Dependent Is this a change from baseline?: Change from baseline, expected to last >3 days Does the patient have difficulty walking or climbing stairs?: Yes(secondary to weakness) Weakness of Legs: Both Weakness of Arms/Hands: None  Permission Sought/Granted Permission sought to share information with : Facility Art therapist granted to share information with : Yes, Verbal Permission Granted     Permission granted to share info w AGENCY: [place]        Emotional Assessment Appearance:: Appears stated age  Admission diagnosis:  Acute respiratory failure with hypoxia ([place]) [J96.01] Sepsis ([place]) [A41.9] Patient Active Problem List    Diagnosis Date Noted  . Acute respiratory failure with hypoxia ([place]) 10/12/2019  . Acute lower UTI   . Pancytopenia ([place])   . Community acquired pneumonia   . Hypokalemia 11/02/2016  . Acute encephalopathy 11/02/2016  . Vasovagal syncope 11/02/2016  . Near syncope 11/02/2016  . Essential hypertension 09/11/2016  . Renal stones 05/04/2014  . Pyelonephritis, acute 04/06/2014  . Pyelonephritis 04/06/2014  . Nephrolithiasis 04/06/2014  . 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 Sepsis ([place]) 02/28/2014  . Severe sepsis ([place]) 02/28/2014  . Hydronephrosis, left 02/28/2014  . E. coli pyelonephritis 02/28/2014  . Leukopenia 03/05/2012  . Varices, esophageal ([place]) 02/29/2012  . Acute upper GI bleeding 02/29/2012  . Gastric ulcer, acute with hemorrhage 02/29/2012  . Dementia without behavioral disturbance ([place]) 02/26/2012  . Cirrhosis, non-alcoholic ([place]) 47/34/0370  . Melena 02/26/2012  . Acute blood loss anemia 02/26/2012  . Diarrhea 02/26/2012  . Fracture of humerus 02/26/2012  . Ventral hernia without obstruction or gangrene 01/13/2012  . Colon cancer ([place]) 08/26/2011  . Bladder cancer ([place]) 08/26/2011  . Thrombocytopenia ([place]) 08/26/2011  . Anemia, iron deficiency 08/26/2011   PCP:  [name], MD Pharmacy:   [facility] [place], [place] - [address] [place] [address] [place] [address] [place] [address] Phone: [phone] Fax: [phone]     Social Determinants of Health (SDOH) Interventions    Readmission Risk Interventions No flowsheet data found.

## 2019-10-18 LAB — CULTURE, BLOOD (ROUTINE X 2)
Culture: NO GROWTH
Special Requests: ADEQUATE

## 2019-10-19 DIAGNOSIS — M19072 Primary osteoarthritis, left ankle and foot: Secondary | ICD-10-CM | POA: Diagnosis not present

## 2019-10-19 DIAGNOSIS — J9811 Atelectasis: Secondary | ICD-10-CM | POA: Diagnosis not present

## 2019-10-19 DIAGNOSIS — G9340 Encephalopathy, unspecified: Secondary | ICD-10-CM | POA: Diagnosis not present

## 2019-10-19 DIAGNOSIS — I119 Hypertensive heart disease without heart failure: Secondary | ICD-10-CM | POA: Diagnosis not present

## 2019-10-19 DIAGNOSIS — K7581 Nonalcoholic steatohepatitis (NASH): Secondary | ICD-10-CM | POA: Diagnosis not present

## 2019-10-19 DIAGNOSIS — M810 Age-related osteoporosis without current pathological fracture: Secondary | ICD-10-CM | POA: Diagnosis not present

## 2019-10-19 DIAGNOSIS — F0390 Unspecified dementia without behavioral disturbance: Secondary | ICD-10-CM | POA: Diagnosis not present

## 2019-10-19 DIAGNOSIS — M1711 Unilateral primary osteoarthritis, right knee: Secondary | ICD-10-CM | POA: Diagnosis not present

## 2019-10-19 DIAGNOSIS — M47819 Spondylosis without myelopathy or radiculopathy, site unspecified: Secondary | ICD-10-CM | POA: Diagnosis not present

## 2019-10-19 DIAGNOSIS — K439 Ventral hernia without obstruction or gangrene: Secondary | ICD-10-CM | POA: Diagnosis not present

## 2019-10-19 DIAGNOSIS — F329 Major depressive disorder, single episode, unspecified: Secondary | ICD-10-CM | POA: Diagnosis not present

## 2019-10-19 DIAGNOSIS — A419 Sepsis, unspecified organism: Secondary | ICD-10-CM | POA: Diagnosis not present

## 2019-10-19 DIAGNOSIS — K7460 Unspecified cirrhosis of liver: Secondary | ICD-10-CM | POA: Diagnosis not present

## 2019-10-19 DIAGNOSIS — K644 Residual hemorrhoidal skin tags: Secondary | ICD-10-CM | POA: Diagnosis not present

## 2019-10-19 DIAGNOSIS — M19071 Primary osteoarthritis, right ankle and foot: Secondary | ICD-10-CM | POA: Diagnosis not present

## 2019-10-19 DIAGNOSIS — J90 Pleural effusion, not elsewhere classified: Secondary | ICD-10-CM | POA: Diagnosis not present

## 2019-10-19 DIAGNOSIS — I700 Atherosclerosis of aorta: Secondary | ICD-10-CM | POA: Diagnosis not present

## 2019-10-19 DIAGNOSIS — I8500 Esophageal varices without bleeding: Secondary | ICD-10-CM | POA: Diagnosis not present

## 2019-10-19 DIAGNOSIS — J9601 Acute respiratory failure with hypoxia: Secondary | ICD-10-CM | POA: Diagnosis not present

## 2019-10-19 DIAGNOSIS — I81 Portal vein thrombosis: Secondary | ICD-10-CM | POA: Diagnosis not present

## 2019-10-19 DIAGNOSIS — K766 Portal hypertension: Secondary | ICD-10-CM | POA: Diagnosis not present

## 2019-10-19 DIAGNOSIS — J189 Pneumonia, unspecified organism: Secondary | ICD-10-CM | POA: Diagnosis not present

## 2019-10-19 DIAGNOSIS — M19041 Primary osteoarthritis, right hand: Secondary | ICD-10-CM | POA: Diagnosis not present

## 2019-10-19 DIAGNOSIS — K219 Gastro-esophageal reflux disease without esophagitis: Secondary | ICD-10-CM | POA: Diagnosis not present

## 2019-10-19 DIAGNOSIS — M19042 Primary osteoarthritis, left hand: Secondary | ICD-10-CM | POA: Diagnosis not present

## 2019-10-20 DIAGNOSIS — J9601 Acute respiratory failure with hypoxia: Secondary | ICD-10-CM | POA: Diagnosis not present

## 2019-10-20 DIAGNOSIS — I119 Hypertensive heart disease without heart failure: Secondary | ICD-10-CM | POA: Diagnosis not present

## 2019-10-20 DIAGNOSIS — J189 Pneumonia, unspecified organism: Secondary | ICD-10-CM | POA: Diagnosis not present

## 2019-10-20 DIAGNOSIS — J90 Pleural effusion, not elsewhere classified: Secondary | ICD-10-CM | POA: Diagnosis not present

## 2019-10-20 DIAGNOSIS — A419 Sepsis, unspecified organism: Secondary | ICD-10-CM | POA: Diagnosis not present

## 2019-10-20 DIAGNOSIS — F0390 Unspecified dementia without behavioral disturbance: Secondary | ICD-10-CM | POA: Diagnosis not present

## 2019-10-21 DIAGNOSIS — J9601 Acute respiratory failure with hypoxia: Secondary | ICD-10-CM | POA: Diagnosis not present

## 2019-10-21 DIAGNOSIS — F0390 Unspecified dementia without behavioral disturbance: Secondary | ICD-10-CM | POA: Diagnosis not present

## 2019-10-21 DIAGNOSIS — J90 Pleural effusion, not elsewhere classified: Secondary | ICD-10-CM | POA: Diagnosis not present

## 2019-10-21 DIAGNOSIS — A419 Sepsis, unspecified organism: Secondary | ICD-10-CM | POA: Diagnosis not present

## 2019-10-21 DIAGNOSIS — J189 Pneumonia, unspecified organism: Secondary | ICD-10-CM | POA: Diagnosis not present

## 2019-10-21 DIAGNOSIS — I119 Hypertensive heart disease without heart failure: Secondary | ICD-10-CM | POA: Diagnosis not present

## 2019-10-24 DIAGNOSIS — J9601 Acute respiratory failure with hypoxia: Secondary | ICD-10-CM | POA: Diagnosis not present

## 2019-10-24 DIAGNOSIS — J189 Pneumonia, unspecified organism: Secondary | ICD-10-CM | POA: Diagnosis not present

## 2019-10-24 DIAGNOSIS — J90 Pleural effusion, not elsewhere classified: Secondary | ICD-10-CM | POA: Diagnosis not present

## 2019-10-24 DIAGNOSIS — A419 Sepsis, unspecified organism: Secondary | ICD-10-CM | POA: Diagnosis not present

## 2019-10-24 DIAGNOSIS — F0390 Unspecified dementia without behavioral disturbance: Secondary | ICD-10-CM | POA: Diagnosis not present

## 2019-10-24 DIAGNOSIS — I119 Hypertensive heart disease without heart failure: Secondary | ICD-10-CM | POA: Diagnosis not present

## 2019-10-25 DIAGNOSIS — J90 Pleural effusion, not elsewhere classified: Secondary | ICD-10-CM | POA: Diagnosis not present

## 2019-10-25 DIAGNOSIS — A419 Sepsis, unspecified organism: Secondary | ICD-10-CM | POA: Diagnosis not present

## 2019-10-25 DIAGNOSIS — J189 Pneumonia, unspecified organism: Secondary | ICD-10-CM | POA: Diagnosis not present

## 2019-10-25 DIAGNOSIS — J9601 Acute respiratory failure with hypoxia: Secondary | ICD-10-CM | POA: Diagnosis not present

## 2019-10-25 DIAGNOSIS — I119 Hypertensive heart disease without heart failure: Secondary | ICD-10-CM | POA: Diagnosis not present

## 2019-10-25 DIAGNOSIS — F0390 Unspecified dementia without behavioral disturbance: Secondary | ICD-10-CM | POA: Diagnosis not present

## 2019-10-28 DIAGNOSIS — J9601 Acute respiratory failure with hypoxia: Secondary | ICD-10-CM | POA: Diagnosis not present

## 2019-10-28 DIAGNOSIS — I119 Hypertensive heart disease without heart failure: Secondary | ICD-10-CM | POA: Diagnosis not present

## 2019-10-28 DIAGNOSIS — F0390 Unspecified dementia without behavioral disturbance: Secondary | ICD-10-CM | POA: Diagnosis not present

## 2019-10-28 DIAGNOSIS — A419 Sepsis, unspecified organism: Secondary | ICD-10-CM | POA: Diagnosis not present

## 2019-10-28 DIAGNOSIS — J189 Pneumonia, unspecified organism: Secondary | ICD-10-CM | POA: Diagnosis not present

## 2019-10-28 DIAGNOSIS — J90 Pleural effusion, not elsewhere classified: Secondary | ICD-10-CM | POA: Diagnosis not present

## 2019-11-01 DIAGNOSIS — I119 Hypertensive heart disease without heart failure: Secondary | ICD-10-CM | POA: Diagnosis not present

## 2019-11-01 DIAGNOSIS — A419 Sepsis, unspecified organism: Secondary | ICD-10-CM | POA: Diagnosis not present

## 2019-11-01 DIAGNOSIS — F0390 Unspecified dementia without behavioral disturbance: Secondary | ICD-10-CM | POA: Diagnosis not present

## 2019-11-01 DIAGNOSIS — J9601 Acute respiratory failure with hypoxia: Secondary | ICD-10-CM | POA: Diagnosis not present

## 2019-11-01 DIAGNOSIS — J189 Pneumonia, unspecified organism: Secondary | ICD-10-CM | POA: Diagnosis not present

## 2019-11-01 DIAGNOSIS — J90 Pleural effusion, not elsewhere classified: Secondary | ICD-10-CM | POA: Diagnosis not present

## 2019-11-03 ENCOUNTER — Encounter: Payer: Self-pay | Admitting: Family Medicine

## 2019-11-03 ENCOUNTER — Other Ambulatory Visit: Payer: Self-pay

## 2019-11-03 ENCOUNTER — Ambulatory Visit (INDEPENDENT_AMBULATORY_CARE_PROVIDER_SITE_OTHER): Payer: Medicare Other | Admitting: Family Medicine

## 2019-11-03 VITALS — BP 100/60 | HR 60 | Temp 96.8°F | Resp 16 | Ht 63.0 in | Wt 213.0 lb

## 2019-11-03 DIAGNOSIS — Z23 Encounter for immunization: Secondary | ICD-10-CM | POA: Diagnosis not present

## 2019-11-03 DIAGNOSIS — J189 Pneumonia, unspecified organism: Secondary | ICD-10-CM

## 2019-11-03 DIAGNOSIS — D61818 Other pancytopenia: Secondary | ICD-10-CM

## 2019-11-03 DIAGNOSIS — F0391 Unspecified dementia with behavioral disturbance: Secondary | ICD-10-CM

## 2019-11-03 DIAGNOSIS — K7460 Unspecified cirrhosis of liver: Secondary | ICD-10-CM

## 2019-11-03 LAB — CBC WITH DIFFERENTIAL/PLATELET
Absolute Monocytes: 367 cells/uL (ref 200–950)
Basophils Absolute: 20 cells/uL (ref 0–200)
Basophils Relative: 0.7 %
Eosinophils Absolute: 81 cells/uL (ref 15–500)
Eosinophils Relative: 2.9 %
HCT: 31.4 % — ABNORMAL LOW (ref 35.0–45.0)
Hemoglobin: 10.4 g/dL — ABNORMAL LOW (ref 11.7–15.5)
Lymphs Abs: 652 cells/uL — ABNORMAL LOW (ref 850–3900)
MCH: 32.6 pg (ref 27.0–33.0)
MCHC: 33.1 g/dL (ref 32.0–36.0)
MCV: 98.4 fL (ref 80.0–100.0)
MPV: 12.4 fL (ref 7.5–12.5)
Monocytes Relative: 13.1 %
Neutro Abs: 1680 cells/uL (ref 1500–7800)
Neutrophils Relative %: 60 %
Platelets: 64 10*3/uL — ABNORMAL LOW (ref 140–400)
RBC: 3.19 10*6/uL — ABNORMAL LOW (ref 3.80–5.10)
RDW: 18.3 % — ABNORMAL HIGH (ref 11.0–15.0)
Total Lymphocyte: 23.3 %
WBC: 2.8 10*3/uL — ABNORMAL LOW (ref 3.8–10.8)

## 2019-11-03 LAB — COMPLETE METABOLIC PANEL WITH GFR
AG Ratio: 1.2 (calc) (ref 1.0–2.5)
ALT: 13 U/L (ref 6–29)
AST: 20 U/L (ref 10–35)
Albumin: 2.8 g/dL — ABNORMAL LOW (ref 3.6–5.1)
Alkaline phosphatase (APISO): 78 U/L (ref 37–153)
BUN: 12 mg/dL (ref 7–25)
CO2: 27 mmol/L (ref 20–32)
Calcium: 9.7 mg/dL (ref 8.6–10.4)
Chloride: 105 mmol/L (ref 98–110)
Creat: 0.69 mg/dL (ref 0.60–0.88)
GFR, Est African American: 95 mL/min/{1.73_m2} (ref >=60)
GFR, Est Non African American: 82 mL/min/{1.73_m2} (ref >=60)
Globulin: 2.4 g/dL (calc) (ref 1.9–3.7)
Glucose, Bld: 116 mg/dL — ABNORMAL HIGH (ref 65–99)
Potassium: 4.1 mmol/L (ref 3.5–5.3)
Sodium: 139 mmol/L (ref 135–146)
Total Bilirubin: 1.8 mg/dL — ABNORMAL HIGH (ref 0.2–1.2)
Total Protein: 5.2 g/dL — ABNORMAL LOW (ref 6.1–8.1)

## 2019-11-03 MED ORDER — CLOTRIMAZOLE-BETAMETHASONE 1-0.05 % EX CREA: 1.0000 "application " | TOPICAL_CREAM | Freq: Two times a day (BID) | CUTANEOUS | 2 refills | Status: DC

## 2019-11-03 NOTE — Progress Notes (Signed)
Subjective:    Patient ID: [name], female    DOB: 1941/03/23, 79 y.o.   MRN: [id number]  HPI   Recently admitted to the hospital.  Initial cbc showed a WBC less than 2, Hgb~8, and platelet count~30.  I have copied relevant portions of the DC summary below for reference:  Admit date: 10/12/2019 Discharge date: 10/17/2019  Time spent: 40 mins   Recommendations for Outpatient Follow-up:  1. PCP in 1 week with repeat labs 2. GI as scheduled  Discharge Diagnoses:      Active Hospital Problems   Diagnosis Date Noted  . Acute respiratory failure with hypoxia ([place]) 10/12/2019  . Community acquired pneumonia   . Essential hypertension 09/11/2016  . Sepsis ([place]) 02/28/2014  . Dementia without behavioral disturbance ([place]) 02/26/2012  . Thrombocytopenia ([place]) 08/26/2011  . Bladder cancer [facility]) 08/26/2011    Resolved Hospital Problems  No resolved problems to display.    Discharge Condition: Stable  Diet recommendation: As tolerated      Vitals:   10/16/19 2257 10/17/19 0507  BP: (!) 130/59 (!) 119/47  Pulse: (!) 56 (!) 57  Resp: 20 16  Temp: 98.3 F (36.8 C) 98.2 F (36.8 C)  SpO2: 92% 94%    History of present illness:  Erica Strubel Dobbinsis a 79 y.o.femalewith medical history significant ofadvanced dementia, depression, essential hypertension, GERD,and bladder cancer status post ostomy who presented to the ED via EMS for worsening confusion, decreased oral intake and progressive weakness. Patient with history advanced dementia and husband at bedside gives majority of the HPI. Husband reports that her dementia has slowly advanced over the last 2 years, but at baseline she is able to ambulate with the use of a walker. Currently lives at home with husband with occasional outside support. In the ED, VS showed: Temp of 100.8, HR 63, RR 20, BP 99/88, SPO2 95% on 2 L nasal cannula. WBC count 2.5, hemoglobin 10.2, platelet count 26, BUN 29, creatinine  0.90, Lactic acid 4.0. Covid-19/SARS-CoV-2 PCR negative. Rapid influenza negative. INR 1.8. Chest x-ray with right lower base density. Patient was given a 2 L NS bolus with azithromycin and ceftriaxone. ED physician referred for admission for acute hypoxic respiratory failure/sepsis likely secondary to community-acquired pneumonia.  Today, met patient sleeping, easily arousable, pleasantly confused.  Looked comfortable, denies any new complaints.  Noted to have had a bowel movement.  Patient stable for discharge with close follow-up with PCP, GI.  Husband refusing SNF at the moment, wants patient home.  Home health PT/OT/RN/aide/social worker ordered   Hospital Course:  Principal Problem:   Acute respiratory failure with hypoxia (HCC) Active Problems:   Bladder cancer (HCC)   Thrombocytopenia ([place])   Dementia without behavioral disturbance ([place])   Sepsis ([place])   Essential hypertension   Community acquired pneumonia  Acute hypoxic respiratory failure likely 2/2 CAP Sepsis, present on admission Remains afebrile Lactic acid of 4.0-->2.4-->1.5 BC X 2 NGTD Procalcitonin trended downwards 17--> 19-->13 Covid-19/SARS-CoV-2 PCR negative. 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 Acute blood loss anemia Bleeding per rectum has now resolved Transfused 2 units of PRBC, as well as 2 units of platelets on 10/13/19, 2 units of platelets/FFP on 10/14/2019  D/CIV octreotideon 10/16/19, continueProtonix GI consulted, noted large hemorrhoids, not a candidate for endoscopy due to thrombocytopenia, continue  lactulose, lidocaine ointment for external hemorrhoids CT abdomen/pelvisshowed multifocal nonspecific bowel wall thickening, cirrhosis with portal hypertension, splenomegaly, paraesophageal varices, 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 daily  GERD Continue PPI  Hxbladder cancer status post urostomy  Remote history of stage II adenocarcinoma of the colon  GOC Very poor prognosis Palliative careon board, plan for outpatient palliative   11/03/19  Patient looks remarkably well today compared to her recent stay in the hospital.  She is awake.  She is alert and back to her baseline.  She is pleasantly confused but is answering questions and talkative.  She is accompanied today by her husband and another family member.  They deny any further melena.  They deny any hematochezia.  They do have a rash in the creases between her vagina and her upper thighs bilaterally.  This looks like Candida intertrigo likely due to sweat and incontinence.  Otherwise she is doing well.  She denies any chest pain or shortness of breath or dyspnea on exertion.  She has a difficult time contributing to the history due to her end-stage dementia but she is smiling and talkative throughout the encounter.  Husband and family member state that she is not eating well.  They have to encourage her and at best she will drink 1 or 2 boost a day.  She may possibly be aspirating as well when she eats has occasionally she will get choked.  Today we spent more than 40 minutes discussing goals of care as well as hospice and outpatient palliative care.   Past Medical History:    Diagnosis Date  . Anemia, iron deficiency   . Arthritis   . Bladder cancer [facility]) 2003   Bladder removed - [name]  . Cataract immature   . Cirrhosis ([place]) 08/26/2011  . Colon cancer [facility]) 2001   Hemicolectomy - [name]  . Depression   . GERD (gastroesophageal reflux disease)   . Headache(784.0)   . Hemorrhoids   . History of colon polyps   . History of DVT (deep vein thrombosis) 50+yrs ago   With pregnancy  . History of GI bleed   . History of kidney stones   . History of urostomy    Urostomy d/t hx bladder cancer  . Hx of transfusion of whole blood   . Hypertension   . Joint pain   . Memory loss    Chemotherapy; short and long term  . Osteoporosis   . Thrombocytopenia ([place])    Past Surgical History:  Procedure Laterality Date  . ABDOMINAL HYSTERECTOMY    . APPENDECTOMY    . BLADDER SURGERY     bladder removed d/t cancer  . CHOLECYSTECTOMY    . COLON RESECTION     with colostomy then colostomy reversal  . COLON SURGERY  2004  . COLON SURGERY  2001  . COLONOSCOPY    . ESOPHAGOGASTRODUODENOSCOPY  02/27/2012   Procedure: ESOPHAGOGASTRODUODENOSCOPY (EGD);  Surgeon: [name], MD;  Location: [name];  Service: Endoscopy;  Laterality: N/A;  bedside case  . NEPHROLITHOTOMY Left 05/04/2014   Procedure: LEFT PERCUTANEOUS  NEPHROLITHOTOMY ;  Surgeon: [name], MD;  Location: [facility];  Service: Urology;  Laterality: Left;  . TOTAL KNEE ARTHROPLASTY  2010   Left knee   Current Outpatient Medications on File Prior to Visit  Medication Sig Dispense Refill  . acidophilus (RISAQUAD) CAPS capsule Take 1 capsule by mouth daily.    Marland Kitchen Apoaequorin (PREVAGEN EXTRA STRENGTH) 20 MG CAPS Take 20 mg by mouth daily.    . Ascorbic Acid (VITAMIN C) 1000 MG tablet Take 1,000 mg by mouth daily.    . cholecalciferol (VITAMIN D) 1000 units tablet Take 1,000 Units by mouth daily.    . Cinnamon 500 MG capsule Take 500 mg by mouth daily.     . citalopram (CELEXA) 40 MG tablet TAKE 1  TABLET BY MOUTH EVERY DAY (Patient taking differently: Take 40 mg by mouth. ) 90 tablet 3  . fexofenadine (ALLEGRA) 180 MG tablet Take 180 mg by mouth daily.    . Ginkgo Biloba 60 MG TABS Take 60 mg by mouth daily.     Marland Kitchen lactulose (CHRONULAC) 10 GM/15ML solution TAKE 30 MLS BY MOUTH THREE TIMES DAILY (Patient taking differently: Take 20 g by mouth daily. ) 1000 mL 12  . lidocaine (XYLOCAINE) 5 % ointment Apply topically 3 (three) times daily as needed (For hemorrhoids). 35.44 g 0  . losartan (COZAAR) 25 MG tablet TAKE 1 TABLET(25 MG) BY MOUTH DAILY (Patient taking differently: Take 25 mg by mouth daily. ) 90 tablet 2  . magic mouthwash w/lidocaine SOLN Take 5 mLs by mouth 3 (three) times daily as needed for mouth pain. 450 mL 1  . Multiple Vitamin (MULTIVITAMIN WITH MINERALS) TABS tablet Take 1 tablet by mouth daily.    Marland Kitchen omega-3 acid ethyl esters (LOVAZA) 1 g capsule Take 1 g by mouth daily.    . pantoprazole (PROTONIX) 40 MG tablet TAKE 1 TABLET(40 MG) BY MOUTH DAILY (Patient taking differently: Take 40 mg by mouth daily. ) 90 tablet 3  . spironolactone (ALDACTONE) 50 MG tablet TAKE 1 TABLET(50 MG) BY MOUTH DAILY (Patient taking differently: Take 50 mg by mouth daily. ) 90 tablet 0  . vitamin B-12 (CYANOCOBALAMIN) 1000 MCG tablet Take 1,000 mcg by mouth daily.    . vitamin E 400 UNIT capsule Take 400 Units by mouth daily.     No current facility-administered medications on file prior to visit.   Allergies  Allergen Reactions  . Bee Venom Anaphylaxis  . Codeine Anaphylaxis and Rash  . Morphine And Related Anaphylaxis and Rash  . Phytonadione Other (See Comments)    Pt experienced an episode of cyanosis following dose of Vitamin K 10 mg IV.  Marland Kitchen Penicillins Rash and Other (See Comments)    Has patient had a PCN reaction causing immediate rash, facial/tongue/throat swelling, SOB or lightheadedness with hypotension: No Has patient had a PCN reaction causing severe rash involving mucus membranes  or skin necrosis: No Has patient had a PCN reaction that required hospitalization: No Has patient had a PCN reaction occurring within the last 10 years: No If all of the above answers are "NO", then may proceed with Cephalosporin use.  . Promethazine Hcl Other (See Comments)    Reaction:  Unknown    Social History   Socioeconomic History  . Marital status: Married    Spouse name: Not on file  . Number of children: Not on file  . Years of education: Not on file  . Highest education level: Not on file  Occupational History  . Not on file  Tobacco Use  . Smoking status: Former Smoker    Packs/day: 0.50    Years: 35.00    Pack years: 17.50    Types: Cigarettes    Start date: 10/25/1955    Quit date: 09/22/1991    Years since quitting: 28.1  . Smokeless tobacco: Never Used  . Tobacco comment: quit 23years ago  Substance and Sexual Activity  . Alcohol use: No    Alcohol/week: 0.0 standard drinks  . Drug use: No  . Sexual activity: Yes    Birth control/protection: Surgical  Other Topics Concern  . Not on file  Social History Narrative   Lives at home with her husband, ambulatory   Social Determinants of Health   Financial Resource Strain:   . Difficulty of Paying Living Expenses: Not on file  Food Insecurity:   . Worried About Charity fundraiser in the Last Year: Not on file  . Ran Out of Food in the Last Year: Not on file  Transportation Needs:   . Lack of Transportation (Medical): Not on file  . Lack of Transportation (Non-Medical): Not on file  Physical Activity:   . Days of Exercise per Week: Not on file  . Minutes of Exercise per Session: Not on file  Stress:   . Feeling of Stress : Not on file  Social Connections:   . Frequency of Communication with Friends and Family: Not on file  . Frequency of Social Gatherings with Friends and Family: Not on file  . Attends Religious Services: Not on file  . Active Member of Clubs or Organizations: Not on file  . Attends  Archivist Meetings: Not on file  . Marital Status: Not on file  Intimate Partner Violence:   . Fear of Current or Ex-Partner: Not on file  . Emotionally Abused: Not on file  . Physically Abused: Not on file  . Sexually Abused: Not on file   Family History  Problem Relation Age of Onset  . Diabetes Brother   . Cancer Other   . Hypertension Other       Review of Systems  All other systems reviewed and are negative.      Objective:   Physical Exam  Constitutional: She appears well-developed and well-nourished. No distress.  HENT:  Nose: Nose normal.  Mouth/Throat: Oropharynx is clear and moist.  Eyes: Conjunctivae are normal. Right eye exhibits no discharge. Left eye exhibits no discharge. No scleral icterus.  Neck: No thyromegaly present.  Cardiovascular: Normal rate and regular rhythm.  Murmur heard. Pulmonary/Chest: Effort normal and breath sounds normal. No respiratory distress. She has no wheezes. She has no rales.  Abdominal: Soft. Bowel sounds are normal. She exhibits no distension and no mass. There is no abdominal tenderness. There is no rebound and no guarding.  Musculoskeletal:        General: No tenderness or edema. Normal range of motion.     Cervical back: Neck supple.  Lymphadenopathy:    She has no cervical adenopathy.  Neurological: She is alert. She has normal reflexes. No cranial nerve deficit. She exhibits normal muscle tone. Coordination normal.  Skin: Rash noted. She is not diaphoretic. There is erythema.     Psychiatric: Cognition and memory are impaired.  Vitals reviewed.         Assessment & Plan:  Pancytopenia ([place]) - Plan: CBC with Differential/Platelet, COMPLETE METABOLIC PANEL WITH GFR  Dementia with behavioral disturbance, unspecified dementia type (  [place])  Community acquired pneumonia of right lower lobe of lung  Clinically.  I will treat her intertrigo with Lotrisone cream twice daily for 14 days.  We discussed cleaning  to help prevent this in the future.  We discussed her pancytopenia.  I will check a CBC however I believe her cirrhosis is reaching end-stage.  I believe it is only a matter of time before her platelet counts drop again and she experiences GI bleeding.  I also believe she is likely aspirating.  Therefore I had a long discussion with the patient and the family about goals of care.  I recommended hospice.  I do believe the patient's life expectancy is less than 6 months due to her cirrhosis, pancytopenia, and dementia.  I believe the patient's husband would benefit from this additional assistance at home.  I also recommended against performing a swallowing study as I do not believe that this would add to the patient's quality of life.  If we did find that the patient was aspirating she would not be a candidate for a feeding tube and I do not believe that she would eat well on a thickened or pured diet as she is already demonstrating poor appetite.  Therefore I strongly recommended that we focus on quality of life and comfort of care and therefore I believe hospice is appropriate.  We also discussed DNR/DNI orders.  The husband agrees that the patient should be DNR/DNI and I completed the paperwork for him.  I will consult hospice.

## 2019-11-04 ENCOUNTER — Other Ambulatory Visit: Payer: Self-pay | Admitting: Hematology & Oncology

## 2019-11-04 DIAGNOSIS — J189 Pneumonia, unspecified organism: Secondary | ICD-10-CM | POA: Diagnosis not present

## 2019-11-04 DIAGNOSIS — J90 Pleural effusion, not elsewhere classified: Secondary | ICD-10-CM | POA: Diagnosis not present

## 2019-11-04 DIAGNOSIS — D500 Iron deficiency anemia secondary to blood loss (chronic): Secondary | ICD-10-CM

## 2019-11-04 DIAGNOSIS — F0390 Unspecified dementia without behavioral disturbance: Secondary | ICD-10-CM | POA: Diagnosis not present

## 2019-11-04 DIAGNOSIS — D696 Thrombocytopenia, unspecified: Secondary | ICD-10-CM

## 2019-11-04 DIAGNOSIS — I119 Hypertensive heart disease without heart failure: Secondary | ICD-10-CM | POA: Diagnosis not present

## 2019-11-04 DIAGNOSIS — J9601 Acute respiratory failure with hypoxia: Secondary | ICD-10-CM | POA: Diagnosis not present

## 2019-11-04 DIAGNOSIS — K7460 Unspecified cirrhosis of liver: Secondary | ICD-10-CM

## 2019-11-04 DIAGNOSIS — A419 Sepsis, unspecified organism: Secondary | ICD-10-CM | POA: Diagnosis not present

## 2019-11-08 DIAGNOSIS — A419 Sepsis, unspecified organism: Secondary | ICD-10-CM | POA: Diagnosis not present

## 2019-11-08 DIAGNOSIS — J189 Pneumonia, unspecified organism: Secondary | ICD-10-CM | POA: Diagnosis not present

## 2019-11-08 DIAGNOSIS — J90 Pleural effusion, not elsewhere classified: Secondary | ICD-10-CM | POA: Diagnosis not present

## 2019-11-08 DIAGNOSIS — J9601 Acute respiratory failure with hypoxia: Secondary | ICD-10-CM | POA: Diagnosis not present

## 2019-11-08 DIAGNOSIS — I119 Hypertensive heart disease without heart failure: Secondary | ICD-10-CM | POA: Diagnosis not present

## 2019-11-08 DIAGNOSIS — F0390 Unspecified dementia without behavioral disturbance: Secondary | ICD-10-CM | POA: Diagnosis not present

## 2019-11-11 DIAGNOSIS — J9601 Acute respiratory failure with hypoxia: Secondary | ICD-10-CM | POA: Diagnosis not present

## 2019-11-11 DIAGNOSIS — I119 Hypertensive heart disease without heart failure: Secondary | ICD-10-CM | POA: Diagnosis not present

## 2019-11-11 DIAGNOSIS — F0390 Unspecified dementia without behavioral disturbance: Secondary | ICD-10-CM | POA: Diagnosis not present

## 2019-11-11 DIAGNOSIS — A419 Sepsis, unspecified organism: Secondary | ICD-10-CM | POA: Diagnosis not present

## 2019-11-11 DIAGNOSIS — J90 Pleural effusion, not elsewhere classified: Secondary | ICD-10-CM | POA: Diagnosis not present

## 2019-11-11 DIAGNOSIS — J189 Pneumonia, unspecified organism: Secondary | ICD-10-CM | POA: Diagnosis not present

## 2019-11-15 DIAGNOSIS — J90 Pleural effusion, not elsewhere classified: Secondary | ICD-10-CM | POA: Diagnosis not present

## 2019-11-15 DIAGNOSIS — J9601 Acute respiratory failure with hypoxia: Secondary | ICD-10-CM | POA: Diagnosis not present

## 2019-11-15 DIAGNOSIS — F0390 Unspecified dementia without behavioral disturbance: Secondary | ICD-10-CM | POA: Diagnosis not present

## 2019-11-15 DIAGNOSIS — I119 Hypertensive heart disease without heart failure: Secondary | ICD-10-CM | POA: Diagnosis not present

## 2019-11-15 DIAGNOSIS — A419 Sepsis, unspecified organism: Secondary | ICD-10-CM | POA: Diagnosis not present

## 2019-11-15 DIAGNOSIS — J189 Pneumonia, unspecified organism: Secondary | ICD-10-CM | POA: Diagnosis not present

## 2019-11-18 DIAGNOSIS — A419 Sepsis, unspecified organism: Secondary | ICD-10-CM | POA: Diagnosis not present

## 2019-11-18 DIAGNOSIS — M47819 Spondylosis without myelopathy or radiculopathy, site unspecified: Secondary | ICD-10-CM | POA: Diagnosis not present

## 2019-11-18 DIAGNOSIS — M19041 Primary osteoarthritis, right hand: Secondary | ICD-10-CM | POA: Diagnosis not present

## 2019-11-18 DIAGNOSIS — K7460 Unspecified cirrhosis of liver: Secondary | ICD-10-CM | POA: Diagnosis not present

## 2019-11-18 DIAGNOSIS — M1711 Unilateral primary osteoarthritis, right knee: Secondary | ICD-10-CM | POA: Diagnosis not present

## 2019-11-18 DIAGNOSIS — I81 Portal vein thrombosis: Secondary | ICD-10-CM | POA: Diagnosis not present

## 2019-11-18 DIAGNOSIS — M810 Age-related osteoporosis without current pathological fracture: Secondary | ICD-10-CM | POA: Diagnosis not present

## 2019-11-18 DIAGNOSIS — J189 Pneumonia, unspecified organism: Secondary | ICD-10-CM | POA: Diagnosis not present

## 2019-11-18 DIAGNOSIS — J90 Pleural effusion, not elsewhere classified: Secondary | ICD-10-CM | POA: Diagnosis not present

## 2019-11-18 DIAGNOSIS — M19042 Primary osteoarthritis, left hand: Secondary | ICD-10-CM | POA: Diagnosis not present

## 2019-11-18 DIAGNOSIS — K766 Portal hypertension: Secondary | ICD-10-CM | POA: Diagnosis not present

## 2019-11-18 DIAGNOSIS — K439 Ventral hernia without obstruction or gangrene: Secondary | ICD-10-CM | POA: Diagnosis not present

## 2019-11-18 DIAGNOSIS — M19071 Primary osteoarthritis, right ankle and foot: Secondary | ICD-10-CM | POA: Diagnosis not present

## 2019-11-18 DIAGNOSIS — F0390 Unspecified dementia without behavioral disturbance: Secondary | ICD-10-CM | POA: Diagnosis not present

## 2019-11-18 DIAGNOSIS — K644 Residual hemorrhoidal skin tags: Secondary | ICD-10-CM | POA: Diagnosis not present

## 2019-11-18 DIAGNOSIS — M19072 Primary osteoarthritis, left ankle and foot: Secondary | ICD-10-CM | POA: Diagnosis not present

## 2019-11-18 DIAGNOSIS — K7581 Nonalcoholic steatohepatitis (NASH): Secondary | ICD-10-CM | POA: Diagnosis not present

## 2019-11-18 DIAGNOSIS — I8500 Esophageal varices without bleeding: Secondary | ICD-10-CM | POA: Diagnosis not present

## 2019-11-18 DIAGNOSIS — I700 Atherosclerosis of aorta: Secondary | ICD-10-CM | POA: Diagnosis not present

## 2019-11-18 DIAGNOSIS — K219 Gastro-esophageal reflux disease without esophagitis: Secondary | ICD-10-CM | POA: Diagnosis not present

## 2019-11-18 DIAGNOSIS — I119 Hypertensive heart disease without heart failure: Secondary | ICD-10-CM | POA: Diagnosis not present

## 2019-11-18 DIAGNOSIS — G9340 Encephalopathy, unspecified: Secondary | ICD-10-CM | POA: Diagnosis not present

## 2019-11-18 DIAGNOSIS — J9601 Acute respiratory failure with hypoxia: Secondary | ICD-10-CM | POA: Diagnosis not present

## 2019-11-18 DIAGNOSIS — J9811 Atelectasis: Secondary | ICD-10-CM | POA: Diagnosis not present

## 2019-11-18 DIAGNOSIS — F329 Major depressive disorder, single episode, unspecified: Secondary | ICD-10-CM | POA: Diagnosis not present

## 2019-11-23 DIAGNOSIS — J9601 Acute respiratory failure with hypoxia: Secondary | ICD-10-CM | POA: Diagnosis not present

## 2019-11-23 DIAGNOSIS — A419 Sepsis, unspecified organism: Secondary | ICD-10-CM | POA: Diagnosis not present

## 2019-11-23 DIAGNOSIS — J189 Pneumonia, unspecified organism: Secondary | ICD-10-CM | POA: Diagnosis not present

## 2019-11-23 DIAGNOSIS — I119 Hypertensive heart disease without heart failure: Secondary | ICD-10-CM | POA: Diagnosis not present

## 2019-11-23 DIAGNOSIS — F0390 Unspecified dementia without behavioral disturbance: Secondary | ICD-10-CM | POA: Diagnosis not present

## 2019-11-23 DIAGNOSIS — J90 Pleural effusion, not elsewhere classified: Secondary | ICD-10-CM | POA: Diagnosis not present

## 2019-11-29 DIAGNOSIS — F0390 Unspecified dementia without behavioral disturbance: Secondary | ICD-10-CM | POA: Diagnosis not present

## 2019-11-29 DIAGNOSIS — J90 Pleural effusion, not elsewhere classified: Secondary | ICD-10-CM | POA: Diagnosis not present

## 2019-11-29 DIAGNOSIS — I119 Hypertensive heart disease without heart failure: Secondary | ICD-10-CM | POA: Diagnosis not present

## 2019-11-29 DIAGNOSIS — J9601 Acute respiratory failure with hypoxia: Secondary | ICD-10-CM | POA: Diagnosis not present

## 2019-11-29 DIAGNOSIS — A419 Sepsis, unspecified organism: Secondary | ICD-10-CM | POA: Diagnosis not present

## 2019-11-29 DIAGNOSIS — J189 Pneumonia, unspecified organism: Secondary | ICD-10-CM | POA: Diagnosis not present

## 2019-12-02 ENCOUNTER — Other Ambulatory Visit: Payer: Self-pay | Admitting: Gastroenterology

## 2019-12-02 DIAGNOSIS — K7460 Unspecified cirrhosis of liver: Secondary | ICD-10-CM

## 2019-12-02 DIAGNOSIS — Z23 Encounter for immunization: Secondary | ICD-10-CM | POA: Diagnosis not present

## 2019-12-08 ENCOUNTER — Ambulatory Visit
Admission: RE | Admit: 2019-12-08 | Discharge: 2019-12-08 | Disposition: A | Discharge status: Home / Self Care | Payer: Medicare Other | Source: Ambulatory Visit | Attending: Gastroenterology | Admitting: Gastroenterology

## 2019-12-08 DIAGNOSIS — K7460 Unspecified cirrhosis of liver: Secondary | ICD-10-CM | POA: Diagnosis not present

## 2020-02-11 ENCOUNTER — Other Ambulatory Visit: Payer: Self-pay | Admitting: Hematology & Oncology

## 2020-02-11 DIAGNOSIS — K7460 Unspecified cirrhosis of liver: Secondary | ICD-10-CM

## 2020-02-11 DIAGNOSIS — D500 Iron deficiency anemia secondary to blood loss (chronic): Secondary | ICD-10-CM

## 2020-02-11 DIAGNOSIS — D696 Thrombocytopenia, unspecified: Secondary | ICD-10-CM

## 2020-02-14 ENCOUNTER — Inpatient Hospital Stay: Payer: Medicare Other

## 2020-02-14 ENCOUNTER — Other Ambulatory Visit: Payer: Self-pay

## 2020-02-14 ENCOUNTER — Encounter: Payer: Self-pay | Admitting: Family

## 2020-02-14 ENCOUNTER — Inpatient Hospital Stay: Payer: Medicare Other | Attending: Family | Admitting: Family

## 2020-02-14 ENCOUNTER — Telehealth: Payer: Self-pay | Admitting: Hematology & Oncology

## 2020-02-14 VITALS — BP 108/42 | HR 54 | Temp 97.3°F | Resp 19 | Ht 63.0 in

## 2020-02-14 DIAGNOSIS — C182 Malignant neoplasm of ascending colon: Secondary | ICD-10-CM | POA: Diagnosis not present

## 2020-02-14 DIAGNOSIS — C679 Malignant neoplasm of bladder, unspecified: Secondary | ICD-10-CM

## 2020-02-14 DIAGNOSIS — K7460 Unspecified cirrhosis of liver: Secondary | ICD-10-CM | POA: Diagnosis not present

## 2020-02-14 DIAGNOSIS — Z8551 Personal history of malignant neoplasm of bladder: Secondary | ICD-10-CM | POA: Insufficient documentation

## 2020-02-14 DIAGNOSIS — D500 Iron deficiency anemia secondary to blood loss (chronic): Secondary | ICD-10-CM

## 2020-02-14 DIAGNOSIS — Z85038 Personal history of other malignant neoplasm of large intestine: Secondary | ICD-10-CM | POA: Insufficient documentation

## 2020-02-14 DIAGNOSIS — D509 Iron deficiency anemia, unspecified: Secondary | ICD-10-CM | POA: Diagnosis not present

## 2020-02-14 LAB — CMP (CANCER CENTER ONLY)
ALT: 14 U/L (ref 0–44)
AST: 23 U/L (ref 15–41)
Albumin: 3.1 g/dL — ABNORMAL LOW (ref 3.5–5.0)
Alkaline Phosphatase: 67 U/L (ref 38–126)
Anion gap: 5 (ref 5–15)
BUN: 24 mg/dL — ABNORMAL HIGH (ref 8–23)
CO2: 27 mmol/L (ref 22–32)
Calcium: 10.9 mg/dL — ABNORMAL HIGH (ref 8.9–10.3)
Chloride: 108 mmol/L (ref 98–111)
Creatinine: 0.84 mg/dL (ref 0.44–1.00)
GFR, Est AFR Am: >60 mL/min (ref >60)
GFR, Estimated: >60 mL/min (ref >60)
Glucose, Bld: 135 mg/dL — ABNORMAL HIGH (ref 70–99)
Potassium: 4.4 mmol/L (ref 3.5–5.1)
Sodium: 140 mmol/L (ref 135–145)
Total Bilirubin: 2.1 mg/dL — ABNORMAL HIGH (ref 0.3–1.2)
Total Protein: 5.6 g/dL — ABNORMAL LOW (ref 6.5–8.1)

## 2020-02-14 LAB — CBC WITH DIFFERENTIAL (CANCER CENTER ONLY)
Abs Immature Granulocytes: 0.01 10*3/uL (ref 0.00–0.07)
Basophils Absolute: 0 10*3/uL (ref 0.0–0.1)
Basophils Relative: 1 %
Eosinophils Absolute: 0.1 10*3/uL (ref 0.0–0.5)
Eosinophils Relative: 3 %
HCT: 34.5 % — ABNORMAL LOW (ref 36.0–46.0)
Hemoglobin: 11.6 g/dL — ABNORMAL LOW (ref 12.0–15.0)
Immature Granulocytes: 0 %
Lymphocytes Relative: 23 %
Lymphs Abs: 0.7 10*3/uL (ref 0.7–4.0)
MCH: 34 pg (ref 26.0–34.0)
MCHC: 33.6 g/dL (ref 30.0–36.0)
MCV: 101.2 fL — ABNORMAL HIGH (ref 80.0–100.0)
Monocytes Absolute: 0.3 10*3/uL (ref 0.1–1.0)
Monocytes Relative: 10 %
Neutro Abs: 2.1 10*3/uL (ref 1.7–7.7)
Neutrophils Relative %: 63 %
Platelet Count: 49 10*3/uL — ABNORMAL LOW (ref 150–400)
RBC: 3.41 MIL/uL — ABNORMAL LOW (ref 3.87–5.11)
RDW: 15.5 % (ref 11.5–15.5)
WBC Count: 3.3 10*3/uL — ABNORMAL LOW (ref 4.0–10.5)
nRBC: 0 % (ref 0.0–0.2)

## 2020-02-14 LAB — AMMONIA: Ammonia: 61 umol/L — ABNORMAL HIGH (ref 9–35)

## 2020-02-14 NOTE — Telephone Encounter (Signed)
Appointments scheduled calendar printed & mailed per 5/25 los

## 2020-02-14 NOTE — Progress Notes (Signed)
Hematology and Oncology Follow Up Visit  [name] [id number] 14-Oct-1940 79 y.o. 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 She was hospitalized in January with pneumonia, sepsis and pancytopenia requiring a blood transfusion.  Due to dementia her husband healing with her health history today.  She has not been eating or drinking well and prefers to sleep most of the day.  She is in a wheelchair today and was not weighed.  No fever, chills, n/v, cough, rash, dizziness, chest pain, palpitations or changes in bowel or bladder habits.  She has chronic abdominal discomfort that waxes and wanes.  Her husband does states that she appears to get SOB at times of over exertion and will rest when needed.  No falls or syncopal episodes to report.  He has not noted any blood loss. No abnormal bruising, no petechiae.   ECOG Performance Status: 1 - Symptomatic but completely ambulatory  Medications:  Allergies as of 02/14/2020      Reactions   Bee Venom Anaphylaxis   Codeine Anaphylaxis, Rash   Morphine And Related Anaphylaxis, Rash   Phytonadione Other (See Comments)   Pt experienced an episode of cyanosis following dose of Vitamin K 10 mg IV.   Penicillins Rash, Other (See Comments)   Has patient had a PCN reaction causing immediate rash, facial/tongue/throat swelling, SOB or lightheadedness with hypotension: No Has patient had a PCN reaction causing severe rash involving mucus membranes or skin necrosis: No Has patient had a PCN reaction that required hospitalization: No Has patient had a PCN reaction occurring within the last 10 years:  No If all of the above answers are "NO", then may proceed with Cephalosporin use.   Promethazine Hcl Other (See Comments)   Reaction:  Unknown       Medication List       Accurate as of Feb 14, 2020  1:10 PM. If you have any questions, ask your nurse or doctor.        acidophilus Caps capsule Take 1 capsule by mouth daily.   cholecalciferol 1000 units tablet Commonly known as: VITAMIN D Take 1,000 Units by mouth daily.   Cinnamon 500 MG capsule Take 500 mg by mouth daily.   citalopram 40 MG tablet Commonly known as: CELEXA TAKE 1 TABLET BY MOUTH EVERY DAY   clotrimazole-betamethasone cream Commonly known as: Lotrisone Apply 1 application topically 2 (two) times daily.   fexofenadine 180 MG tablet Commonly known as: ALLEGRA Take 180 mg by mouth daily.   Ginkgo Biloba 60 MG Tabs Take 60 mg by mouth daily.   lactulose 10 GM/15ML solution Commonly known as: CHRONULAC TAKE 30 MLS BY MOUTH THREE TIMES DAILY   lidocaine 5 % ointment Commonly known as: XYLOCAINE Apply topically 3 (three) times daily as needed (For hemorrhoids).   losartan 25 MG tablet Commonly known as: COZAAR TAKE 1 TABLET(25 MG) BY MOUTH DAILY   magic mouthwash w/lidocaine Soln Take 5 mLs by mouth 3 (three) times daily as needed for mouth pain.   multivitamin with minerals Tabs tablet Take 1 tablet by mouth daily.   omega-3 acid ethyl esters 1 g capsule Commonly known as: LOVAZA Take 1 g by mouth  daily.   pantoprazole 40 MG tablet Commonly known as: PROTONIX TAKE 1 TABLET(40 MG) BY MOUTH DAILY   Prevagen Extra Strength 20 MG Caps Generic drug: Apoaequorin Take 20 mg by mouth daily.   spironolactone 50 MG tablet Commonly known as: ALDACTONE TAKE 1 TABLET(50 MG) BY MOUTH DAILY   vitamin B-12 1000 MCG tablet Commonly known as: CYANOCOBALAMIN Take 1,000 mcg by mouth daily.   vitamin C 1000 MG tablet Take 1,000 mg by mouth daily.   vitamin E 180 MG (400 UNITS) capsule Take 400  Units by mouth daily.       Allergies:  Allergies  Allergen Reactions  . Bee Venom Anaphylaxis  . Codeine Anaphylaxis and Rash  . Morphine And Related Anaphylaxis and Rash  . Phytonadione Other (See Comments)    Pt experienced an episode of cyanosis following dose of Vitamin K 10 mg IV.  Marland Kitchen Penicillins Rash and Other (See Comments)    Has patient had a PCN reaction causing immediate rash, facial/tongue/throat swelling, SOB or lightheadedness with hypotension: No Has patient had a PCN reaction causing severe rash involving mucus membranes or skin necrosis: No Has patient had a PCN reaction that required hospitalization: No Has patient had a PCN reaction occurring within the last 10 years: No If all of the above answers are "NO", then may proceed with Cephalosporin use.  . Promethazine Hcl Other (See Comments)    Reaction:  Unknown     Past Medical History, Surgical history, Social history, and Family History were reviewed and updated.  Review of Systems: All other 10 point review of systems is negative.   Physical Exam:  vitals were not taken for this visit.   Wt Readings from Last 3 Encounters:  11/03/19 213 lb (96.6 kg)  10/16/19 224 lb 6.9 oz (101.8 kg)  08/15/19 208 lb (94.3 kg)    Ocular: Sclerae unicteric, pupils equal, round and reactive to light Ear-nose-throat: Oropharynx clear, dentition fair Lymphatic: No cervical or supraclavicular adenopathy Lungs no rales or rhonchi, good excursion bilaterally Heart regular rate and rhythm, no murmur appreciated Abd soft, nontender, positive bowel sounds, no liver or spleen tip palpated on exam, no fluid wave  MSK no focal spinal tenderness, no joint edema Neuro: non-focal, well-oriented, appropriate affect Breasts: Deferred   Lab Results  Component Value Date   WBC 2.8 (L) 11/03/2019   HGB 10.4 (L) 11/03/2019   HCT 31.4 (L) 11/03/2019   MCV 98.4 11/03/2019   PLT 64 (L) 11/03/2019   Lab Results  Component Value Date    FERRITIN 48 10/14/2019   IRON 63 10/14/2019   TIBC 263 10/14/2019   UIBC 200 10/14/2019   IRONPCTSAT 24 10/14/2019   Lab Results  Component Value Date   RETICCTPCT 2.0 11/23/2014   RBC 3.19 (L) 11/03/2019   RETICCTABS 74.4 11/23/2014   No results found for: KPAFRELGTCHN, LAMBDASER, KAPLAMBRATIO No results found for: IGGSERUM, IGA, IGMSERUM No results found for: Ronnald Ramp, A1GS, A2GS, Violet Baldy, MSPIKE, SPEI   Chemistry      Component Value Date/Time   NA 139 11/03/2019 1441   NA 141 08/20/2017 1315   K 4.1 11/03/2019 1441   K 4.2 08/20/2017 1315   CL 105 11/03/2019 1441   CL 108 08/20/2017 1315   CO2 27 11/03/2019 1441   CO2 27 08/20/2017 1315   BUN 12 11/03/2019 1441   BUN 14 08/20/2017 1315   CREATININE 0.69 11/03/2019 1441      Component Value Date/Time  CALCIUM 9.7 11/03/2019 1441   CALCIUM 10.0 08/20/2017 1315   ALKPHOS 74 10/17/2019 0516   ALKPHOS 73 08/20/2017 1315   AST 20 11/03/2019 1441   AST 20 08/15/2019 1307   ALT 13 11/03/2019 1441   ALT 12 08/15/2019 1307   ALT 18 08/20/2017 1315   BILITOT 1.8 (H) 11/03/2019 1441   BILITOT 2.0 (H) 08/15/2019 1307       Impression and Plan: [name] is a very pleasant 79 yo caucasian female with multiple health problems including intermittent iron deficiency anemia, history of stage II colon cancer andstage IV (T3, N1, M0) transitional cell carcinoma of the bladder.  She also has NASH and splenomegaly followed by Dr. [name].  Ammonia level is 61 We will see her her iron studies show and bring her back in for infusion if needed.  She is asymptomatic with her platelet count at this time.  We will plan to see her back in another 6 months.  Her husband will contact our office with any questions or concerns. We can certainly see her sooner if needed.   Laverna Peace, NP 5/25/20211:10 PM

## 2020-02-15 LAB — IRON AND TIBC
Iron: 124 ug/dL (ref 41–142)
Saturation Ratios: 46 % (ref 21–57)
TIBC: 269 ug/dL (ref 236–444)
UIBC: 144 ug/dL (ref 120–384)

## 2020-02-15 LAB — FERRITIN: Ferritin: 27 ng/mL (ref 11–307)

## 2020-02-26 ENCOUNTER — Other Ambulatory Visit: Payer: Self-pay | Admitting: Family Medicine

## 2020-03-13 ENCOUNTER — Other Ambulatory Visit: Payer: Self-pay | Admitting: Gastroenterology

## 2020-03-13 DIAGNOSIS — R932 Abnormal findings on diagnostic imaging of liver and biliary tract: Secondary | ICD-10-CM

## 2020-03-16 ENCOUNTER — Other Ambulatory Visit: Payer: Self-pay | Admitting: Hematology & Oncology

## 2020-03-16 DIAGNOSIS — G9340 Encephalopathy, unspecified: Secondary | ICD-10-CM

## 2020-03-16 DIAGNOSIS — R7989 Other specified abnormal findings of blood chemistry: Secondary | ICD-10-CM

## 2020-03-29 ENCOUNTER — Other Ambulatory Visit: Payer: Self-pay | Admitting: Gastroenterology

## 2020-03-29 ENCOUNTER — Ambulatory Visit
Admission: RE | Admit: 2020-03-29 | Discharge: 2020-03-29 | Disposition: A | Discharge status: Home / Self Care | Payer: Medicare Other | Source: Ambulatory Visit | Attending: Gastroenterology | Admitting: Gastroenterology

## 2020-03-29 ENCOUNTER — Other Ambulatory Visit: Payer: Self-pay

## 2020-03-29 DIAGNOSIS — R932 Abnormal findings on diagnostic imaging of liver and biliary tract: Secondary | ICD-10-CM

## 2020-04-17 ENCOUNTER — Other Ambulatory Visit: Payer: Self-pay | Admitting: Family Medicine

## 2020-04-17 DIAGNOSIS — I10 Essential (primary) hypertension: Secondary | ICD-10-CM

## 2020-05-10 DIAGNOSIS — Z87440 Personal history of urinary (tract) infections: Secondary | ICD-10-CM | POA: Diagnosis not present

## 2020-05-10 DIAGNOSIS — Z87442 Personal history of urinary calculi: Secondary | ICD-10-CM | POA: Diagnosis not present

## 2020-05-10 DIAGNOSIS — Z8551 Personal history of malignant neoplasm of bladder: Secondary | ICD-10-CM | POA: Diagnosis not present

## 2020-05-10 DIAGNOSIS — D3002 Benign neoplasm of left kidney: Secondary | ICD-10-CM | POA: Diagnosis not present

## 2020-05-10 DIAGNOSIS — N281 Cyst of kidney, acquired: Secondary | ICD-10-CM | POA: Diagnosis not present

## 2020-06-28 ENCOUNTER — Telehealth: Payer: Self-pay | Admitting: Family

## 2020-06-28 NOTE — Telephone Encounter (Signed)
Letter & Calendar mailed to patient due to provider schedule change for 11/29/ Appointments have been rescheduled due to Provider PAL

## 2020-07-30 ENCOUNTER — Other Ambulatory Visit: Payer: Self-pay

## 2020-07-30 ENCOUNTER — Other Ambulatory Visit: Payer: Medicare Other

## 2020-07-30 ENCOUNTER — Ambulatory Visit (INDEPENDENT_AMBULATORY_CARE_PROVIDER_SITE_OTHER): Payer: Medicare Other

## 2020-07-30 DIAGNOSIS — F0391 Unspecified dementia with behavioral disturbance: Secondary | ICD-10-CM

## 2020-07-30 DIAGNOSIS — Z23 Encounter for immunization: Secondary | ICD-10-CM | POA: Diagnosis not present

## 2020-07-30 DIAGNOSIS — K7460 Unspecified cirrhosis of liver: Secondary | ICD-10-CM

## 2020-07-30 DIAGNOSIS — Z0000 Encounter for general adult medical examination without abnormal findings: Secondary | ICD-10-CM

## 2020-07-30 DIAGNOSIS — I10 Essential (primary) hypertension: Secondary | ICD-10-CM

## 2020-07-31 ENCOUNTER — Ambulatory Visit (INDEPENDENT_AMBULATORY_CARE_PROVIDER_SITE_OTHER): Payer: Medicare Other | Admitting: Family Medicine

## 2020-07-31 ENCOUNTER — Telehealth: Payer: Self-pay

## 2020-07-31 ENCOUNTER — Other Ambulatory Visit: Payer: Self-pay

## 2020-07-31 VITALS — BP 110/74 | HR 56 | Temp 98.1°F | Ht 63.0 in

## 2020-07-31 DIAGNOSIS — Z8551 Personal history of malignant neoplasm of bladder: Secondary | ICD-10-CM | POA: Diagnosis not present

## 2020-07-31 DIAGNOSIS — Z85038 Personal history of other malignant neoplasm of large intestine: Secondary | ICD-10-CM | POA: Diagnosis not present

## 2020-07-31 DIAGNOSIS — D61818 Other pancytopenia: Secondary | ICD-10-CM

## 2020-07-31 DIAGNOSIS — Z0000 Encounter for general adult medical examination without abnormal findings: Secondary | ICD-10-CM

## 2020-07-31 DIAGNOSIS — K7581 Nonalcoholic steatohepatitis (NASH): Secondary | ICD-10-CM | POA: Diagnosis not present

## 2020-07-31 DIAGNOSIS — F0391 Unspecified dementia with behavioral disturbance: Secondary | ICD-10-CM

## 2020-07-31 DIAGNOSIS — K7460 Unspecified cirrhosis of liver: Secondary | ICD-10-CM

## 2020-07-31 DIAGNOSIS — Z0001 Encounter for general adult medical examination with abnormal findings: Secondary | ICD-10-CM | POA: Diagnosis not present

## 2020-07-31 DIAGNOSIS — I10 Essential (primary) hypertension: Secondary | ICD-10-CM | POA: Diagnosis not present

## 2020-07-31 LAB — COMPLETE METABOLIC PANEL WITH GFR
AG Ratio: 1.2 (calc) (ref 1.0–2.5)
ALT: 14 U/L (ref 6–29)
AST: 22 U/L (ref 10–35)
Albumin: 3 g/dL — ABNORMAL LOW (ref 3.6–5.1)
Alkaline phosphatase (APISO): 78 U/L (ref 37–153)
BUN/Creatinine Ratio: 26 (calc) — ABNORMAL HIGH (ref 6–22)
BUN: 24 mg/dL (ref 7–25)
CO2: 26 mmol/L (ref 20–32)
Calcium: 10.7 mg/dL — ABNORMAL HIGH (ref 8.6–10.4)
Chloride: 107 mmol/L (ref 98–110)
Creat: 0.92 mg/dL — ABNORMAL HIGH (ref 0.60–0.88)
GFR, Est African American: 68 mL/min/{1.73_m2} (ref >=60)
GFR, Est Non African American: 58 mL/min/{1.73_m2} — ABNORMAL LOW (ref >=60)
Globulin: 2.6 g/dL (calc) (ref 1.9–3.7)
Glucose, Bld: 108 mg/dL — ABNORMAL HIGH (ref 65–99)
Potassium: 4.7 mmol/L (ref 3.5–5.3)
Sodium: 140 mmol/L (ref 135–146)
Total Bilirubin: 1.8 mg/dL — ABNORMAL HIGH (ref 0.2–1.2)
Total Protein: 5.6 g/dL — ABNORMAL LOW (ref 6.1–8.1)

## 2020-07-31 LAB — CBC WITH DIFFERENTIAL/PLATELET
Absolute Monocytes: 464 cells/uL (ref 200–950)
Basophils Absolute: 19 cells/uL (ref 0–200)
Basophils Relative: 0.6 %
Eosinophils Absolute: 61 cells/uL (ref 15–500)
Eosinophils Relative: 1.9 %
HCT: 34.1 % — ABNORMAL LOW (ref 35.0–45.0)
Hemoglobin: 11.4 g/dL — ABNORMAL LOW (ref 11.7–15.5)
Lymphs Abs: 685 cells/uL — ABNORMAL LOW (ref 850–3900)
MCH: 33 pg (ref 27.0–33.0)
MCHC: 33.4 g/dL (ref 32.0–36.0)
MCV: 98.8 fL (ref 80.0–100.0)
Monocytes Relative: 14.5 %
Neutro Abs: 1971 cells/uL (ref 1500–7800)
Neutrophils Relative %: 61.6 %
Platelets: 49 10*3/uL — ABNORMAL LOW (ref 140–400)
RBC: 3.45 10*6/uL — ABNORMAL LOW (ref 3.80–5.10)
RDW: 14 % (ref 11.0–15.0)
Total Lymphocyte: 21.4 %
WBC: 3.2 10*3/uL — ABNORMAL LOW (ref 3.8–10.8)

## 2020-07-31 NOTE — Telephone Encounter (Signed)
Called pt and s/w [name] to r/s appt from 08/27/20 to 08/31/20... AOM

## 2020-07-31 NOTE — Progress Notes (Signed)
Subjective:    Patient ID: [name], female    DOB: Jan 26, 1941, 79 y.o.   MRN: [id number]  HPI   Patient is here today for  complete physical exam. Due to her age and medical comorbidities, I do not recommend a mammogram or Pap smear.  She has advanced dementia.  Her husband has to provide all of her care.  Unfortunately, her dementia has progressed substantially.  She does not answer any questions for me today.  Her daughter is with him.  Her daughter is helping provide care.  I have recommended that we try to reduce her medication list.  She is on numerous vitamins which although reasonable are probably not absolutely necessary.  Therefore I recommended stopping all of the vitamins and supplements except for vitamin D for bone health, and vitamin B12.  I would like to try to wean her off Celexa as she is on high-dose Celexa and I question if it is beneficial for her at this point.  Therefore we discussed whether she benefits from the Celexa that she takes 40 mg a day.  She is taking Protonix regularly which I think is an excellent idea given her history of GI bleed and her thrombocytopenia.  She is also taking lactulose once a day and having 1 bowel movement a day.  I told her husband that I would like her to ideally have 1-2 bowel movements a day to help reduce her risk of encephalopathy.  Her husband has hired individual, help give a bath 3 times a week.  They are not yet ready for hospice. She has a very complicated past medical history. She has a history of colon cancer. In 2001 she underwent a hemicolectomy for stage II (T3, N0, M0) adenocarcinoma of colon.  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 Her bladder was removed in 2008. She wears a bag chronically. She follows up every 6 months with Dr. Jeffie Pollock.  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 This has caused leukopenia with a white count  between 2 and 3, thrombocytopenia. She also has occasional encephalopathy secondary to cirrhosis with an elevated ammonia level. She also has a history of iron deficiency anemia.     Immunization History  Administered Date(s) Administered  . Fluad Quad(high Dose 65+) 05/20/2019, 07/30/2020  . Influenza, High Dose Seasonal PF 07/05/2014  . Influenza, Quadrivalent, Recombinant, Inj, Pf 09/01/2013  . Influenza,inj,Quad PF,6+ Mos 09/01/2013, 05/31/2015, 09/09/2016, 08/20/2017, 08/12/2018  . Influenza,trivalent, recombinat, inj, PF 07/05/2014  . Influenza-Unspecified 06/10/2012, 09/01/2013, 07/05/2014  . Pneumococcal Conjugate-13 06/18/2015  . Pneumococcal Polysaccharide-23 12/07/2009    Past Medical History:  Diagnosis Date  . Anemia, iron deficiency   . Arthritis   . Bladder cancer [facility]) 2003   Bladder removed - [name]  . Cataract immature   . Cirrhosis ([place]) 08/26/2011  . Colon cancer [facility]) 2001   Hemicolectomy - [name]  . Depression   . GERD (gastroesophageal reflux disease)   . Headache(784.0)   . Hemorrhoids   . History of colon polyps   . History of DVT (deep vein thrombosis) 50+yrs ago   With pregnancy  . History of GI bleed   . History of kidney stones   . History of urostomy    Urostomy d/t hx bladder cancer  . Hx of transfusion of whole blood   . Hypertension   . Joint pain   .  Memory loss    Chemotherapy; short and long term  . Osteoporosis   . Thrombocytopenia ([place])    Past Surgical History:  Procedure Laterality Date  . ABDOMINAL HYSTERECTOMY    . APPENDECTOMY    . BLADDER SURGERY     bladder removed d/t cancer  . CHOLECYSTECTOMY    . COLON RESECTION     with colostomy then colostomy reversal  . COLON SURGERY  2004  . COLON SURGERY  2001  . COLONOSCOPY    . ESOPHAGOGASTRODUODENOSCOPY  02/27/2012   Procedure: ESOPHAGOGASTRODUODENOSCOPY (EGD);  Surgeon: [name], MD;  Location: [name];  Service: Endoscopy;  Laterality: N/A;  bedside case   . NEPHROLITHOTOMY Left 05/04/2014   Procedure: LEFT PERCUTANEOUS NEPHROLITHOTOMY ;  Surgeon: [name], MD;  Location: [facility];  Service: Urology;  Laterality: Left;  . TOTAL KNEE ARTHROPLASTY  2010   Left knee    Allergies  Allergen Reactions  . Bee Venom Anaphylaxis  . Codeine Anaphylaxis and Rash  . Morphine And Related Anaphylaxis and Rash  . Phytonadione Other (See Comments)    Pt experienced an episode of cyanosis following dose of Vitamin K 10 mg IV.  Marland Kitchen Penicillins Rash and Other (See Comments)    Has patient had a PCN reaction causing immediate rash, facial/tongue/throat swelling, SOB or lightheadedness with hypotension: No Has patient had a PCN reaction causing severe rash involving mucus membranes or skin necrosis: No Has patient had a PCN reaction that required hospitalization: No Has patient had a PCN reaction occurring within the last 10 years: No If all of the above answers are "NO", then may proceed with Cephalosporin use.  . Promethazine Hcl Other (See Comments)    Reaction:  Unknown    Social History   Socioeconomic History  . Marital status: Married    Spouse name: Not on file  . Number of children: Not on file  . Years of education: Not on file  . Highest education level: Not on file  Occupational History  . Not on file  Tobacco Use  . Smoking status: Former Smoker    Packs/day: 0.50    Years: 35.00    Pack years: 17.50    Types: Cigarettes    Start date: 10/25/1955    Quit date: 09/22/1991    Years since quitting: 28.8  . Smokeless tobacco: Never Used  . Tobacco comment: quit 23years ago  Vaping Use  . Vaping Use: Never used  Substance and Sexual Activity  . Alcohol use: No    Alcohol/week: 0.0 standard drinks  . Drug use: No  . Sexual activity: Yes    Birth control/protection: Surgical  Other Topics Concern  . Not on file  Social History Narrative   Lives at home with her husband, ambulatory   Social Determinants of Health   Financial  Resource Strain:   . Difficulty of Paying Living Expenses: Not on file  Food Insecurity:   . Worried About Charity fundraiser in the Last Year: Not on file  . Ran Out of Food in the Last Year: Not on file  Transportation Needs:   . Lack of Transportation (Medical): Not on file  . Lack of Transportation (Non-Medical): Not on file  Physical Activity:   . Days of Exercise per Week: Not on file  . Minutes of Exercise per Session: Not on file  Stress:   . Feeling of Stress : Not on file  Social Connections:   . Frequency of Communication  with Friends and Family: Not on file  . Frequency of Social Gatherings with Friends and Family: Not on file  . Attends Religious Services: Not on file  . Active Member of Clubs or Organizations: Not on file  . Attends Archivist Meetings: Not on file  . Marital Status: Not on file  Intimate Partner Violence:   . Fear of Current or Ex-Partner: Not on file  . Emotionally Abused: Not on file  . Physically Abused: Not on file  . Sexually Abused: Not on file   Family History  Problem Relation Age of Onset  . Diabetes Brother   . Cancer Other   . Hypertension Other       Review of Systems  All other systems reviewed and are negative.      Objective:   Physical Exam Vitals reviewed.  Constitutional:      General: She is not in acute distress.    Appearance: She is well-developed. She is not diaphoretic.  HENT:     Nose: Nose normal.  Eyes:     General: No scleral icterus.       Right eye: No discharge.        Left eye: No discharge.     Conjunctiva/sclera: Conjunctivae normal.  Neck:     Thyroid: No thyromegaly.  Cardiovascular:     Rate and Rhythm: Normal rate and regular rhythm.     Heart sounds: Murmur heard.   Pulmonary:     Effort: Pulmonary effort is normal. No respiratory distress.     Breath sounds: Normal breath sounds. No wheezing or rales.  Abdominal:     General: Bowel sounds are normal. There is no  distension.     Palpations: Abdomen is soft. There is no mass.     Tenderness: There is no abdominal tenderness. There is no guarding or rebound.  Musculoskeletal:        General: No tenderness. Normal range of motion.     Cervical back: Neck supple.  Lymphadenopathy:     Cervical: No cervical adenopathy.  Neurological:     Mental Status: She is alert.     Cranial Nerves: No cranial nerve deficit.     Motor: No abnormal muscle tone.     Coordination: Coordination normal.     Deep Tendon Reflexes: Reflexes are normal and symmetric.  Psychiatric:        Cognition and Memory: Memory is impaired.      Lab on 07/30/2020  Component Date Value Ref Range Status  . Glucose, Bld 07/30/2020 108* 65 - 99 mg/dL Final   Comment: .            Fasting reference interval . For someone without known diabetes, a glucose value between 100 and 125 mg/dL is consistent with prediabetes and should be confirmed with a follow-up test. .   . BUN 07/30/2020 24  7 - 25 mg/dL Final  . Creat 07/30/2020 0.92* 0.60 - 0.88 mg/dL Final   Comment: For patients >13 years of age, the reference limit for Creatinine is approximately 13% higher for people identified as African-American. .   . GFR, Est Non African American 07/30/2020 58* > OR = 60 mL/min/1.61m Final  . GFR, Est African American 07/30/2020 68  > OR = 60 mL/min/1.753mFinal  . BUN/Creatinine Ratio 07/30/2020 26* 6 - 22 (calc) Final  . Sodium 07/30/2020 140  135 - 146 mmol/L Final  . Potassium 07/30/2020 4.7  3.5 - 5.3 mmol/L Final  .  Chloride 07/30/2020 107  98 - 110 mmol/L Final  . CO2 07/30/2020 26  20 - 32 mmol/L Final  . Calcium 07/30/2020 10.7* 8.6 - 10.4 mg/dL Final  . Total Protein 07/30/2020 5.6* 6.1 - 8.1 g/dL Final  . Albumin 07/30/2020 3.0* 3.6 - 5.1 g/dL Final  . Globulin 07/30/2020 2.6  1.9 - 3.7 g/dL (calc) Final  . AG Ratio 07/30/2020 1.2  1.0 - 2.5 (calc) Final  . Total Bilirubin 07/30/2020 1.8* 0.2 - 1.2 mg/dL Final  .  Alkaline phosphatase (APISO) 07/30/2020 78  37 - 153 U/L Final  . AST 07/30/2020 22  10 - 35 U/L Final  . ALT 07/30/2020 14  6 - 29 U/L Final  . WBC 07/30/2020 3.2* 3.8 - 10.8 Thousand/uL Final  . RBC 07/30/2020 3.45* 3.80 - 5.10 Million/uL Final  . Hemoglobin 07/30/2020 11.4* 11.7 - 15.5 g/dL Final  . HCT 07/30/2020 34.1* 35 - 45 % Final  . MCV 07/30/2020 98.8  80.0 - 100.0 fL Final  . MCH 07/30/2020 33.0  27.0 - 33.0 pg Final  . MCHC 07/30/2020 33.4  32.0 - 36.0 g/dL Final  . RDW 07/30/2020 14.0  11.0 - 15.0 % Final  . Platelets 07/30/2020 49* 140 - 400 Thousand/uL Final  . MPV 07/30/2020   7.5 - 12.5 fL Final   Comment: Due to platelet or RBC variability in size or shape the result cannot be reported accurately.   . Neutro Abs 07/30/2020 1,971  1,500 - 7,800 cells/uL Final  . Lymphs Abs 07/30/2020 685* 850 - 3,900 cells/uL Final  . Absolute Monocytes 07/30/2020 464  200 - 950 cells/uL Final  . Eosinophils Absolute 07/30/2020 61  15.0 - 500.0 cells/uL Final  . Basophils Absolute 07/30/2020 19  0.0 - 200.0 cells/uL Final  . Neutrophils Relative % 07/30/2020 61.6  % Final  . Total Lymphocyte 07/30/2020 21.4  % Final  . Monocytes Relative 07/30/2020 14.5  % Final  . Eosinophils Relative 07/30/2020 1.9  % Final  . Basophils Relative 07/30/2020 0.6  % Final        Assessment & Plan:  Dementia with behavioral disturbance, unspecified dementia type ([place])  Pancytopenia ([place])  General medical exam  Essential hypertension  Liver cirrhosis secondary to nonalcoholic steatohepatitis (NASH) ([place])  History of colon cancer  History of bladder cancer  Unfortunately, the patient is suffering from severe dementia.  Her husband is doing an amazing job taking care of her.  I recommended that we reduce the Celexa to 20 mg a day and if after a month see no negative impact try reducing it to 10 mg a day ultimately to try to wean her off the medication altogether.  I recommended stopping  vitamins and supplements except for vitamin B12 and vitamin D.  Continue Protonix to reduce risk of GI bleeding.  Blood pressures well controlled.  Taylor lactulose to achieve 1-2 bowel movements a day.  Patient is unable to complete depression and dementia screens due to the severity of her dementia.  She is extremely high fall risk.

## 2020-08-07 DIAGNOSIS — Z23 Encounter for immunization: Secondary | ICD-10-CM | POA: Diagnosis not present

## 2020-08-20 ENCOUNTER — Ambulatory Visit: Payer: Medicare Other | Admitting: Family

## 2020-08-20 ENCOUNTER — Other Ambulatory Visit: Payer: Medicare Other

## 2020-08-27 ENCOUNTER — Ambulatory Visit: Payer: Medicare Other | Admitting: Family

## 2020-08-27 ENCOUNTER — Other Ambulatory Visit: Payer: Medicare Other

## 2020-08-29 ENCOUNTER — Encounter (HOSPITAL_COMMUNITY): Payer: Self-pay

## 2020-08-29 ENCOUNTER — Emergency Department (HOSPITAL_COMMUNITY): Payer: Medicare Other

## 2020-08-29 ENCOUNTER — Telehealth: Payer: Self-pay

## 2020-08-29 ENCOUNTER — Emergency Department (HOSPITAL_COMMUNITY)
Admission: EM | Admit: 2020-08-29 | Discharge: 2020-08-29 | Disposition: A | Discharge status: Home / Self Care | Payer: Medicare Other | Attending: Emergency Medicine | Admitting: Emergency Medicine

## 2020-08-29 ENCOUNTER — Other Ambulatory Visit: Payer: Self-pay

## 2020-08-29 DIAGNOSIS — R001 Bradycardia, unspecified: Secondary | ICD-10-CM | POA: Diagnosis not present

## 2020-08-29 DIAGNOSIS — F0390 Unspecified dementia without behavioral disturbance: Secondary | ICD-10-CM | POA: Insufficient documentation

## 2020-08-29 DIAGNOSIS — I517 Cardiomegaly: Secondary | ICD-10-CM | POA: Diagnosis not present

## 2020-08-29 DIAGNOSIS — A419 Sepsis, unspecified organism: Secondary | ICD-10-CM | POA: Diagnosis not present

## 2020-08-29 DIAGNOSIS — I10 Essential (primary) hypertension: Secondary | ICD-10-CM | POA: Diagnosis not present

## 2020-08-29 DIAGNOSIS — J189 Pneumonia, unspecified organism: Secondary | ICD-10-CM | POA: Diagnosis not present

## 2020-08-29 DIAGNOSIS — Z79899 Other long term (current) drug therapy: Secondary | ICD-10-CM | POA: Diagnosis not present

## 2020-08-29 DIAGNOSIS — R0602 Shortness of breath: Secondary | ICD-10-CM | POA: Diagnosis not present

## 2020-08-29 DIAGNOSIS — Z87891 Personal history of nicotine dependence: Secondary | ICD-10-CM | POA: Diagnosis not present

## 2020-08-29 DIAGNOSIS — Z20822 Contact with and (suspected) exposure to covid-19: Secondary | ICD-10-CM | POA: Insufficient documentation

## 2020-08-29 DIAGNOSIS — Z85038 Personal history of other malignant neoplasm of large intestine: Secondary | ICD-10-CM | POA: Diagnosis not present

## 2020-08-29 DIAGNOSIS — Z96652 Presence of left artificial knee joint: Secondary | ICD-10-CM | POA: Diagnosis not present

## 2020-08-29 DIAGNOSIS — R0902 Hypoxemia: Secondary | ICD-10-CM | POA: Diagnosis not present

## 2020-08-29 DIAGNOSIS — R4182 Altered mental status, unspecified: Secondary | ICD-10-CM | POA: Diagnosis not present

## 2020-08-29 DIAGNOSIS — Z8551 Personal history of malignant neoplasm of bladder: Secondary | ICD-10-CM | POA: Diagnosis not present

## 2020-08-29 DIAGNOSIS — R404 Transient alteration of awareness: Secondary | ICD-10-CM | POA: Diagnosis not present

## 2020-08-29 LAB — RESP PANEL BY RT-PCR (FLU A&B, COVID) ARPGX2
Influenza A by PCR: NEGATIVE
Influenza B by PCR: NEGATIVE
SARS Coronavirus 2 by RT PCR: NEGATIVE

## 2020-08-29 LAB — CBC WITH DIFFERENTIAL/PLATELET
Abs Immature Granulocytes: 0.01 10*3/uL (ref 0.00–0.07)
Basophils Absolute: 0 10*3/uL (ref 0.0–0.1)
Basophils Relative: 1 %
Eosinophils Absolute: 0.1 10*3/uL (ref 0.0–0.5)
Eosinophils Relative: 2 %
HCT: 30.4 % — ABNORMAL LOW (ref 36.0–46.0)
Hemoglobin: 10.2 g/dL — ABNORMAL LOW (ref 12.0–15.0)
Immature Granulocytes: 0 %
Lymphocytes Relative: 32 %
Lymphs Abs: 0.9 10*3/uL (ref 0.7–4.0)
MCH: 33.3 pg (ref 26.0–34.0)
MCHC: 33.6 g/dL (ref 30.0–36.0)
MCV: 99.3 fL (ref 80.0–100.0)
Monocytes Absolute: 0.3 10*3/uL (ref 0.1–1.0)
Monocytes Relative: 11 %
Neutro Abs: 1.4 10*3/uL — ABNORMAL LOW (ref 1.7–7.7)
Neutrophils Relative %: 54 %
Platelets: 43 10*3/uL — ABNORMAL LOW (ref 150–400)
RBC: 3.06 MIL/uL — ABNORMAL LOW (ref 3.87–5.11)
RDW: 15.5 % (ref 11.5–15.5)
WBC: 2.6 10*3/uL — ABNORMAL LOW (ref 4.0–10.5)
nRBC: 0 % (ref 0.0–0.2)

## 2020-08-29 LAB — COMPREHENSIVE METABOLIC PANEL
ALT: 16 U/L (ref 0–44)
AST: 25 U/L (ref 15–41)
Albumin: 2.8 g/dL — ABNORMAL LOW (ref 3.5–5.0)
Alkaline Phosphatase: 70 U/L (ref 38–126)
Anion gap: 6 (ref 5–15)
BUN: 25 mg/dL — ABNORMAL HIGH (ref 8–23)
CO2: 23 mmol/L (ref 22–32)
Calcium: 10.5 mg/dL — ABNORMAL HIGH (ref 8.9–10.3)
Chloride: 108 mmol/L (ref 98–111)
Creatinine, Ser: 0.94 mg/dL (ref 0.44–1.00)
GFR, Estimated: >60 mL/min (ref >60)
Glucose, Bld: 95 mg/dL (ref 70–99)
Potassium: 3.7 mmol/L (ref 3.5–5.1)
Sodium: 137 mmol/L (ref 135–145)
Total Bilirubin: 2.7 mg/dL — ABNORMAL HIGH (ref 0.3–1.2)
Total Protein: 5.4 g/dL — ABNORMAL LOW (ref 6.5–8.1)

## 2020-08-29 LAB — URINALYSIS, ROUTINE W REFLEX MICROSCOPIC
Bilirubin Urine: NEGATIVE
Glucose, UA: NEGATIVE mg/dL
Hgb urine dipstick: NEGATIVE
Ketones, ur: NEGATIVE mg/dL
Nitrite: NEGATIVE
Protein, ur: 30 mg/dL — AB
Specific Gravity, Urine: 1.011 (ref 1.005–1.030)
WBC, UA: >50 WBC/hpf — ABNORMAL HIGH (ref 0–5)
pH: 8 (ref 5.0–8.0)

## 2020-08-29 LAB — LACTIC ACID, PLASMA
Lactic Acid, Venous: 2.4 mmol/L (ref 0.5–1.9)
Lactic Acid, Venous: 1.9 mmol/L (ref 0.5–1.9)

## 2020-08-29 LAB — PROTIME-INR
INR: 1.6 — ABNORMAL HIGH (ref 0.8–1.2)
Prothrombin Time: 18.6 seconds — ABNORMAL HIGH (ref 11.4–15.2)

## 2020-08-29 LAB — AMMONIA: Ammonia: 42 umol/L — ABNORMAL HIGH (ref 9–35)

## 2020-08-29 LAB — APTT: aPTT: 38 seconds — ABNORMAL HIGH (ref 24–36)

## 2020-08-29 MED ORDER — LACTATED RINGERS IV BOLUS
1000.0000 mL | Freq: Once | INTRAVENOUS | Status: AC
  Administered 2020-08-29: 1000 mL via INTRAVENOUS

## 2020-08-29 MED ORDER — CEFDINIR 300 MG PO CAPS: 300.0000 mg | ORAL_CAPSULE | Freq: Two times a day (BID) | ORAL | 0 refills | Status: AC

## 2020-08-29 MED ORDER — DOXYCYCLINE HYCLATE 100 MG PO CAPS: 100.0000 mg | ORAL_CAPSULE | Freq: Two times a day (BID) | ORAL | 0 refills | Status: DC

## 2020-08-29 MED ORDER — SODIUM CHLORIDE 0.9 % IV SOLN
1.0000 g | Freq: Once | INTRAVENOUS | Status: AC
  Administered 2020-08-29: 1 g via INTRAVENOUS
  Filled 2020-08-29: qty 10

## 2020-08-29 MED ORDER — LACTATED RINGERS IV SOLN: INTRAVENOUS | Status: DC

## 2020-08-29 NOTE — Sepsis Progress Note (Signed)
Secure chat with bedside RN Meagan about the timing of the blood cultures. She stated that the blood cultures were indeed drawn prior to the antibiotic dosing.

## 2020-08-29 NOTE — Telephone Encounter (Signed)
Pts son [name] called to let us know that she was in [place] with possible pneu and to cx her 08/30/20 appts, inbasket sent to vanessa... AOM

## 2020-08-29 NOTE — ED Notes (Signed)
CRITICAL VALUE ALERT  Critical Value:  Lactic Acid 2.4  Date & Time Notied:  08/29/20 1042  Provider Notified: Dr. Sabra Heck  Orders Received/Actions taken: EDP notified

## 2020-08-29 NOTE — Sepsis Progress Note (Signed)
Elink following this Code Sepsis.

## 2020-08-29 NOTE — ED Provider Notes (Signed)
[name] Provider Note   CSN: [id number] Arrival date & time: 08/29/20  7494     History Chief Complaint  Patient presents with  . Shortness of Breath    [name] is a 79 y.o. female.  HPI   This patient is a very pleasant 78 year old female, she has multiple medical problems including a history of dementia, she has a history of cirrhosis secondary to nonalcoholic steatohepatitis, history of both colon and bladder cancer and has a urostomy bag attached to the right lower abdominal wall.  Unfortunately the patient does have a history of severe dementia and is unable to give me any history regarding why she is here or how she feels.  According to the paramedics who act as an additional historian the patient was brought to the hospital today because the husband felt like she was having some increased work of breathing or difficulty breathing overnight, the patient denies that she is short of breath.  The patient does not know where she is, she does not know her birthday, she only knows her name.  Level 5 caveat applies secondary to severe dementia  I have reviewed the EMR - the pt sees Dr. Dennard Schaumann (PCP)  Past Medical History:  Diagnosis Date  . Anemia, iron deficiency   . Arthritis   . Bladder cancer [facility]) 2003   Bladder removed - [name]  . Cataract immature   . Cirrhosis ([place]) 08/26/2011  . Colon cancer [facility]) 2001   Hemicolectomy - [name]  . Depression   . GERD (gastroesophageal reflux disease)   . Headache(784.0)   . Hemorrhoids   . History of colon polyps   . History of DVT (deep vein thrombosis) 50+yrs ago   With pregnancy  . History of GI bleed   . History of kidney stones   . History of urostomy    Urostomy d/t hx bladder cancer  . Hx of transfusion of whole blood   . Hypertension   . Joint pain   . Memory loss    Chemotherapy; short and long term  . Osteoporosis   . Thrombocytopenia [facility])     Patient Active Problem List    Diagnosis Date Noted  . Acute respiratory failure with hypoxia ([place]) 10/12/2019  . Acute lower UTI   . Pancytopenia ([place])   . Community acquired pneumonia   . Hypokalemia 11/02/2016  . Acute encephalopathy 11/02/2016  . Vasovagal syncope 11/02/2016  . Near syncope 11/02/2016  . Essential hypertension 09/11/2016  . Renal stones 05/04/2014  . Pyelonephritis, acute 04/06/2014  . Pyelonephritis 04/06/2014  . Nephrolithiasis 04/06/2014  . Presence of urostomy ([place]) 04/06/2014  . Sepsis ([place]) 02/28/2014  . Severe sepsis ([place]) 02/28/2014  . Hydronephrosis, left 02/28/2014  . E. coli pyelonephritis 02/28/2014  . Leukopenia 03/05/2012  . Varices, esophageal ([place]) 02/29/2012  . Acute upper GI bleeding 02/29/2012  . Gastric ulcer, acute with hemorrhage 02/29/2012  . Dementia without behavioral disturbance ([place]) 02/26/2012  . Cirrhosis, non-alcoholic ([place]) 49/67/5916  . Melena 02/26/2012  . Acute blood loss anemia 02/26/2012  . Diarrhea 02/26/2012  . Fracture of humerus 02/26/2012  . Ventral hernia without obstruction or gangrene 01/13/2012  . Colon cancer ([place]) 08/26/2011  . Bladder cancer ([place]) 08/26/2011  . Thrombocytopenia ([place]) 08/26/2011  . Anemia, iron deficiency 08/26/2011    Past Surgical History:  Procedure Laterality Date  . ABDOMINAL HYSTERECTOMY    . APPENDECTOMY    . BLADDER SURGERY     bladder  removed d/t cancer  . CHOLECYSTECTOMY    . COLON RESECTION     with colostomy then colostomy reversal  . COLON SURGERY  2004  . COLON SURGERY  2001  . COLONOSCOPY    . ESOPHAGOGASTRODUODENOSCOPY  02/27/2012   Procedure: ESOPHAGOGASTRODUODENOSCOPY (EGD);  Surgeon: [name], MD;  Location: [name];  Service: Endoscopy;  Laterality: N/A;  bedside case  . NEPHROLITHOTOMY Left 05/04/2014   Procedure: LEFT PERCUTANEOUS NEPHROLITHOTOMY ;  Surgeon: [name], MD;  Location: [facility];  Service: Urology;  Laterality: Left;  . TOTAL KNEE ARTHROPLASTY  2010   Left knee      OB History   No obstetric history on file.     Family History  Problem Relation Age of Onset  . Diabetes Brother   . Cancer Other   . Hypertension Other     Social History   Tobacco Use  . Smoking status: Former Smoker    Packs/day: 0.50    Years: 35.00    Pack years: 17.50    Types: Cigarettes    Start date: 10/25/1955    Quit date: 09/22/1991    Years since quitting: 28.9  . Smokeless tobacco: Never Used  . Tobacco comment: quit 23years ago  Vaping Use  . Vaping Use: Never used  Substance Use Topics  . Alcohol use: No    Alcohol/week: 0.0 standard drinks  . Drug use: No    Home Medications Prior to Admission medications   Medication Sig Start Date End Date Taking? Authorizing Provider  acidophilus (RISAQUAD) CAPS capsule Take 1 capsule by mouth daily.    [provider]  Apoaequorin (PREVAGEN EXTRA STRENGTH) 20 MG CAPS Take 20 mg by mouth daily.    [provider]  Ascorbic Acid (VITAMIN C) 1000 MG tablet Take 1,000 mg by mouth daily.    [provider]  cefdinir (OMNICEF) 300 MG capsule Take 1 capsule (300 mg total) by mouth 2 (two) times daily for 7 days. 08/29/20 09/05/20  Noemi Chapel, MD  cholecalciferol (VITAMIN D) 1000 units tablet Take 1,000 Units by mouth daily.    [provider]  Cinnamon 500 MG capsule Take 500 mg by mouth daily.     [provider]  citalopram (CELEXA) 40 MG tablet TAKE 1 TABLET BY MOUTH EVERY DAY Patient taking differently: Take 40 mg by mouth daily.  05/17/19   [name], MD  clotrimazole-betamethasone (LOTRISONE) cream Apply 1 application topically 2 (two) times daily. 11/03/19   [name], MD  doxycycline (VIBRAMYCIN) 100 MG capsule Take 1 capsule (100 mg total) by mouth 2 (two) times daily. 08/29/20   Noemi Chapel, MD  Ginkgo Biloba 60 MG TABS Take 60 mg by mouth daily.     [provider]  lactulose (CHRONULAC) 10 GM/15ML solution TAKE 30 ML BY MOUTH THREE  TIMES DAILY Patient taking differently: Take 20 g by mouth 3 (three) times daily.  03/16/20   [name], MD  lidocaine (XYLOCAINE) 5 % ointment Apply topically 3 (three) times daily as needed (For hemorrhoids). 10/17/19   Alma Friendly, MD  losartan (COZAAR) 25 MG tablet TAKE 1 TABLET(25 MG) BY MOUTH DAILY Patient taking differently: Take 25 mg by mouth daily.  04/17/20   [name], MD  magic mouthwash w/lidocaine SOLN Take 5 mLs by mouth 3 (three) times daily as needed for mouth pain. Patient not taking: Reported on 02/14/2020 08/15/19   Eliezer Bottom, NP  Multiple  Vitamin (MULTIVITAMIN WITH MINERALS) TABS tablet Take 1 tablet by mouth daily.    [provider]  omega-3 acid ethyl esters (LOVAZA) 1 g capsule Take 1 g by mouth daily.    [provider]  pantoprazole (PROTONIX) 40 MG tablet TAKE 1 TABLET(40 MG) BY MOUTH DAILY 02/27/20   [name], MD  spironolactone (ALDACTONE) 50 MG tablet TAKE 1 TABLET(50 MG) BY MOUTH DAILY 02/11/20   [name], MD  vitamin B-12 (CYANOCOBALAMIN) 1000 MCG tablet Take 1,000 mcg by mouth daily.    [provider]  vitamin E 400 UNIT capsule Take 400 Units by mouth daily.    [provider]    Allergies    Bee venom, Codeine, Morphine and related, Phytonadione, Penicillins, and Promethazine hcl  Review of Systems   Review of Systems  Unable to perform ROS: Dementia    Physical Exam Updated Vital Signs BP (!) 110/97   Pulse (!) 53   Temp (!) 97.4 F (36.3 C) (Oral)   Resp 14   Ht 1.626 m (5' 4" )   Wt 86.2 kg   SpO2 100%   BMI 32.61 kg/m   Physical Exam Vitals and nursing note reviewed.  Constitutional:      General: She is not in acute distress.    Appearance: She is well-developed.  HENT:     Head: Normocephalic and atraumatic.     Mouth/Throat:     Pharynx: No oropharyngeal exudate.  Eyes:     General: No scleral icterus.       Right eye: No discharge.        Left  eye: No discharge.     Conjunctiva/sclera: Conjunctivae normal.     Pupils: Pupils are equal, round, and reactive to light.  Neck:     Thyroid: No thyromegaly.     Vascular: No JVD.  Cardiovascular:     Rate and Rhythm: Normal rate and regular rhythm.     Heart sounds: Normal heart sounds. No murmur heard.  No friction rub. No gallop.   Pulmonary:     Effort: Pulmonary effort is normal. No respiratory distress.     Breath sounds: Normal breath sounds. No wheezing or rales.     Comments: Appears to be taking larger than normal breaths occasionally, but no abnormal lungs sounds. Abdominal:     General: Bowel sounds are normal. There is no distension.     Palpations: Abdomen is soft. There is no mass.     Tenderness: There is no abdominal tenderness.     Comments: Non tender abdomen - has diverting Urostomy in the R lower abdomen, bag contains some particulates and is slightly cloudy.  Musculoskeletal:        General: No tenderness. Normal range of motion.     Cervical back: Normal range of motion and neck supple.  Lymphadenopathy:     Cervical: No cervical adenopathy.  Skin:    General: Skin is warm and dry.     Findings: No erythema or rash.  Neurological:     Mental Status: She is alert.     Coordination: Coordination normal.     Comments: Follows commands - not oriented but to name.  Normal strength - seem symmetrical  Psychiatric:        Behavior: Behavior normal.     ED Results / Procedures / Treatments   Labs (all labs ordered are listed, but only abnormal results are displayed) Labs Reviewed  LACTIC ACID, PLASMA - Abnormal;  Notable for the following components:      Result Value   Lactic Acid, Venous 2.4 (*)    All other components within normal limits  COMPREHENSIVE METABOLIC PANEL - Abnormal; Notable for the following components:   BUN 25 (*)    Calcium 10.5 (*)    Total Protein 5.4 (*)    Albumin 2.8 (*)    Total Bilirubin 2.7 (*)    All other components  within normal limits  CBC WITH DIFFERENTIAL/PLATELET - Abnormal; Notable for the following components:   WBC 2.6 (*)    RBC 3.06 (*)    Hemoglobin 10.2 (*)    HCT 30.4 (*)    Platelets 43 (*)    Neutro Abs 1.4 (*)    All other components within normal limits  PROTIME-INR - Abnormal; Notable for the following components:   Prothrombin Time 18.6 (*)    INR 1.6 (*)    All other components within normal limits  APTT - Abnormal; Notable for the following components:   aPTT 38 (*)    All other components within normal limits  URINALYSIS, ROUTINE W REFLEX MICROSCOPIC - Abnormal; Notable for the following components:   Color, Urine AMBER (*)    APPearance TURBID (*)    Protein, ur 30 (*)    Leukocytes,Ua LARGE (*)    WBC, UA >50 (*)    Bacteria, UA RARE (*)    All other components within normal limits  AMMONIA - Abnormal; Notable for the following components:   Ammonia 42 (*)    All other components within normal limits  CULTURE, BLOOD (ROUTINE X 2)  CULTURE, BLOOD (ROUTINE X 2)  RESP PANEL BY RT-PCR (FLU A&B, COVID) ARPGX2  URINE CULTURE  LACTIC ACID, PLASMA    EKG EKG Interpretation  Date/Time:  Wednesday August 29 2020 08:45:09 EST Ventricular Rate:  57 PR Interval:    QRS Duration: 170 QT Interval:  524 QTC Calculation: 511 R Axis:   53 Text Interpretation: Sinus bradycardia Right bundle branch block since last tracing no significant change Confirmed by Noemi Chapel [phone]) on 08/29/2020 9:33:20 AM   Radiology DG Chest Port 1 View  Result Date: 08/29/2020 CLINICAL DATA:  Questionable sepsis - evaluate for abnormality EXAM: PORTABLE CHEST 1 VIEW COMPARISON:  10/12/2019 and prior. FINDINGS: Hazy bibasilar opacities. No pneumothorax or pleural effusion. Cardiomegaly, unchanged. Osteopenia and multilevel spondylosis. IMPRESSION: Hazy bibasilar opacities, atelectasis versus infection. Cardiomegaly. Electronically Signed   By: Primitivo Gauze M.D.   On: 08/29/2020 09:11     Procedures Procedures (including critical care time)  Medications Ordered in ED Medications  lactated ringers infusion ( Intravenous New Bag/Given 08/29/20 0934)  cefTRIAXone (ROCEPHIN) 1 g in sodium chloride 0.9 % 100 mL IVPB (0 g Intravenous Stopped 08/29/20 1008)  lactated ringers bolus 1,000 mL (0 mLs Intravenous Stopped 08/29/20 1100)    ED Course  I have reviewed the triage vital signs and the nursing notes.  Pertinent labs & imaging results that were available during my care of the patient were reviewed by me and considered in my medical decision making (see chart for details).  Clinical Course as of Aug 29 1433  Wed Aug 29, 2020  1332 Reviewed this patient's vital signs going back over the last 6 months to a year, she has a rather persistent borderline hypotension, this is consistent with today's visit where her blood pressures have ranged between 90 systolic and 202 systolic, and one-point she measured around 542 systolic, I think many  of these are related to her position in her arm.  She is not tachycardic, she has no leukocytosis in fact has a chronic leukopenia, her lactic acid was less than 4, we are rechecking a second lactic acid.  She has been given IV fluids and antibiotics for what appears to be a urinary tract infection or possibly a slight early pneumonia.  That would be more appropriate given the increasing coughing and that she had last night.  I have asked her husband if she thinks that she looks normal now and he says that she is much more comfortable without any breathing issues.  I think at this point the patient stable for discharge to finish her antibiotics assuming second lactic acid is improving stable.   [BM]  1431 Lactic improved, < 2, pt reeavluated - last pressure 195 systolic, stable for d/c.  Spouse in agreement, will return if sx worsen   [BM]    Clinical Course User Index [BM] Noemi Chapel, MD   MDM Rules/Calculators/A&P                           Review of the patient's medical record shows that she has suffered with sepsis and even severe sepsis in the past, she does not appear to be hypotensive nor tachycardic but was noted to be tactile febrile prehospital by paramedics.  We will check a rectal temperature, get a urinalysis with a culture, blood cultures, chest x-ray and some labs to see if this patient is suffering from any kind of decompensated infection or other cause of her shortness of breath.  Awaiting family member to arrive for further information   Still no family member at 42:45 AM, he is not available by phone either.  Blood pressure has been 91/70, she is slightly bradycardic at 53, she is not having any objective symptoms that she complains about.  IV fluids given, Rocephin given, x-ray with possible basilar opacities but no obvious other signs of infection, labs and urinalysis pending at this time.  Final Clinical Impression(s) / ED Diagnoses Final diagnoses:  Community acquired pneumonia, unspecified laterality    Rx / DC Orders ED Discharge Orders         Ordered    doxycycline (VIBRAMYCIN) 100 MG capsule  2 times daily        08/29/20 1432    cefdinir (OMNICEF) 300 MG capsule  2 times daily        08/29/20 1433           Noemi Chapel, MD 08/29/20 1434

## 2020-08-29 NOTE — Sepsis Progress Note (Signed)
Notified bedside nurse of need to draw repeat lactic acid. 

## 2020-08-29 NOTE — Discharge Instructions (Signed)
The testing today shows that there may be a very early pneumonia We have given you antibiotics and some fluid Please drink plenty of clear liquids and take the medications exactly as prescribed Cefdinir twice a day by mouth for 7 days Doxycycline 116m by mouth twice daily until the medicine is completely finished - this medicine is a strong antibiotic that treats certain infections.   Return to the emergency department for any severe worsening symptoms including increasing difficulty breathing fevers nausea vomiting or weakness Otherwise see your family doctor within 2 to 3 days for recheck

## 2020-08-29 NOTE — ED Triage Notes (Signed)
Pt presents to ED via [facility] for SOB started during the night.

## 2020-08-30 ENCOUNTER — Inpatient Hospital Stay: Payer: Medicare Other | Admitting: Family

## 2020-08-30 ENCOUNTER — Inpatient Hospital Stay: Payer: Medicare Other

## 2020-08-31 LAB — URINE CULTURE: Culture: >=100000 — AB

## 2020-09-03 ENCOUNTER — Other Ambulatory Visit: Payer: Self-pay

## 2020-09-03 ENCOUNTER — Ambulatory Visit (INDEPENDENT_AMBULATORY_CARE_PROVIDER_SITE_OTHER): Payer: Medicare Other | Admitting: Family Medicine

## 2020-09-03 VITALS — BP 120/70 | HR 95 | Temp 98.3°F | Ht 63.0 in

## 2020-09-03 DIAGNOSIS — J159 Unspecified bacterial pneumonia: Secondary | ICD-10-CM | POA: Diagnosis not present

## 2020-09-03 LAB — CULTURE, BLOOD (ROUTINE X 2)
Culture: NO GROWTH
Culture: NO GROWTH
Special Requests: ADEQUATE

## 2020-09-03 NOTE — Progress Notes (Signed)
Subjective:    Patient ID: [name], female    DOB: [birth year], 79 y.o.   MRN: [id number]  HPI   Patient is here today for hospital follow-up.  On December 8, her home learned that she was demonstrating increased work of breathing, she had been coughing and becoming increasingly more confused.  She was taken to the emergency room.  In the emergency room, her lactic acid level was initially elevated at 2.4.  Chest x-ray showed bibasilar opacities concerning for pneumonia.  She was given Rocephin.  Urinalysis appeared to show contaminant as the patient does have a urostomy bag.  Patient was given doxycycline 100 mg p.o. twice daily and Omnicef 300 mg p.o. twice daily for 7 days.  She is here for follow-up.  She has now 5 days out from her ER visit.  Husband denies any cough.  He denies any fever.  He denies any increased work of breathing.  Her mental status appears to be back to her baseline.  She denies any chest pain.  She denies any abdominal pain.  She denies any fevers or chills.  Has been states that she is back to her baseline  Immunization History  Administered Date(s) Administered  . Fluad Quad(high Dose 65+) 05/20/2019, 07/30/2020  . Influenza, High Dose Seasonal PF 07/05/2014  . Influenza, Quadrivalent, Recombinant, Inj, Pf 09/01/2013  . Influenza,inj,Quad PF,6+ Mos 09/01/2013, 05/31/2015, 09/09/2016, 08/20/2017, 08/12/2018  . Influenza,trivalent, recombinat, inj, PF 07/05/2014  . Influenza-Unspecified 06/10/2012, 09/01/2013, 07/05/2014  . Pneumococcal Conjugate-13 06/18/2015  . Pneumococcal Polysaccharide-23 12/07/2009    Past Medical History:  Diagnosis Date  . Anemia, iron deficiency   . Arthritis   . Bladder cancer [facility]) 2003   Bladder removed - [name]  . Cataract immature   . Cirrhosis ([place]) 08/26/2011  . Colon cancer [facility]) 2001   Hemicolectomy - [name]  . Depression   . GERD (gastroesophageal reflux disease)   . Headache(784.0)   . Hemorrhoids   .  History of colon polyps   . History of DVT (deep vein thrombosis) 50+yrs ago   With pregnancy  . History of GI bleed   . History of kidney stones   . History of urostomy    Urostomy d/t hx bladder cancer  . Hx of transfusion of whole blood   . Hypertension   . Joint pain   . Memory loss    Chemotherapy; short and long term  . Osteoporosis   . Thrombocytopenia ([place])    Past Surgical History:  Procedure Laterality Date  . ABDOMINAL HYSTERECTOMY    . APPENDECTOMY    . BLADDER SURGERY     bladder removed d/t cancer  . CHOLECYSTECTOMY    . COLON RESECTION     with colostomy then colostomy reversal  . COLON SURGERY  2004  . COLON SURGERY  2001  . COLONOSCOPY    . ESOPHAGOGASTRODUODENOSCOPY  02/27/2012   Procedure: ESOPHAGOGASTRODUODENOSCOPY (EGD);  Surgeon: [name], MD;  Location: [name];  Service: Endoscopy;  Laterality: N/A;  bedside case  . NEPHROLITHOTOMY Left 05/04/2014   Procedure: LEFT PERCUTANEOUS NEPHROLITHOTOMY ;  Surgeon: [name], MD;  Location: [facility];  Service: Urology;  Laterality: Left;  . TOTAL KNEE ARTHROPLASTY  2010   Left knee    Allergies  Allergen Reactions  . Bee Venom Anaphylaxis  . Codeine Anaphylaxis and Rash  . Morphine And Related Anaphylaxis and Rash  . Phytonadione Other (See Comments)    Pt experienced  an episode of cyanosis following dose of Vitamin K 10 mg IV.  Marland Kitchen Penicillins Rash and Other (See Comments)    Has patient had a PCN reaction causing immediate rash, facial/tongue/throat swelling, SOB or lightheadedness with hypotension: No Has patient had a PCN reaction causing severe rash involving mucus membranes or skin necrosis: No Has patient had a PCN reaction that required hospitalization: No Has patient had a PCN reaction occurring within the last 10 years: No If all of the above answers are "NO", then may proceed with Cephalosporin use.  . Promethazine Hcl Other (See Comments)    Reaction:  Unknown    Social History    Socioeconomic History  . Marital status: Married    Spouse name: Not on file  . Number of children: Not on file  . Years of education: Not on file  . Highest education level: Not on file  Occupational History  . Not on file  Tobacco Use  . Smoking status: Former Smoker    Packs/day: 0.50    Years: 35.00    Pack years: 17.50    Types: Cigarettes    Start date: 10/25/1955    Quit date: 09/22/1991    Years since quitting: 28.9  . Smokeless tobacco: Never Used  . Tobacco comment: quit 23years ago  Vaping Use  . Vaping Use: Never used  Substance and Sexual Activity  . Alcohol use: No    Alcohol/week: 0.0 standard drinks  . Drug use: No  . Sexual activity: Yes    Birth control/protection: Surgical  Other Topics Concern  . Not on file  Social History Narrative   Lives at home with her husband, ambulatory   Social Determinants of Health   Financial Resource Strain: Not on file  Food Insecurity: Not on file  Transportation Needs: Not on file  Physical Activity: Not on file  Stress: Not on file  Social Connections: Not on file  Intimate Partner Violence: Not on file   Family History  Problem Relation Age of Onset  . Diabetes Brother   . Cancer Other   . Hypertension Other       Review of Systems  All other systems reviewed and are negative.      Objective:   Physical Exam Vitals reviewed.  Constitutional:      General: She is not in acute distress.    Appearance: She is well-developed. She is not diaphoretic.  HENT:     Nose: Nose normal.  Eyes:     General: No scleral icterus.       Right eye: No discharge.        Left eye: No discharge.     Conjunctiva/sclera: Conjunctivae normal.  Neck:     Thyroid: No thyromegaly.  Cardiovascular:     Rate and Rhythm: Normal rate and regular rhythm.     Heart sounds: Murmur heard.    Pulmonary:     Effort: Pulmonary effort is normal. No respiratory distress.     Breath sounds: Normal breath sounds. No wheezing  or rales.  Abdominal:     General: Bowel sounds are normal. There is no distension.     Palpations: Abdomen is soft. There is no mass.     Tenderness: There is no abdominal tenderness. There is no guarding or rebound.  Musculoskeletal:        General: No tenderness. Normal range of motion.     Cervical back: Neck supple.  Lymphadenopathy:     Cervical: No cervical adenopathy.  Neurological:     Mental Status: She is alert.     Cranial Nerves: No cranial nerve deficit.     Motor: No abnormal muscle tone.     Coordination: Coordination normal.     Deep Tendon Reflexes: Reflexes are normal and symmetric.  Psychiatric:        Cognition and Memory: Memory is impaired.            Assessment & Plan:  Community acquired bacterial pneumonia  Patient appears to have community-acquired pneumonia which has been adequately treated with doxycycline and Omnicef.  Complete full antibiotic course.  Follow-up as planned in 6 months or as needed.  No further treatment is necessary at the present time

## 2020-09-18 ENCOUNTER — Encounter (HOSPITAL_COMMUNITY): Payer: Self-pay

## 2020-09-18 ENCOUNTER — Other Ambulatory Visit: Payer: Self-pay

## 2020-09-18 ENCOUNTER — Emergency Department (HOSPITAL_COMMUNITY): Payer: Medicare Other

## 2020-09-18 ENCOUNTER — Inpatient Hospital Stay (HOSPITAL_COMMUNITY)
Admission: EM | Admit: 2020-09-18 | Discharge: 2020-09-25 | DRG: 432 | Disposition: A | Discharge status: Home / Self Care | Payer: Medicare Other | Attending: Internal Medicine | Admitting: Internal Medicine

## 2020-09-18 DIAGNOSIS — K254 Chronic or unspecified gastric ulcer with hemorrhage: Secondary | ICD-10-CM | POA: Diagnosis not present

## 2020-09-18 DIAGNOSIS — R404 Transient alteration of awareness: Secondary | ICD-10-CM | POA: Diagnosis not present

## 2020-09-18 DIAGNOSIS — K766 Portal hypertension: Secondary | ICD-10-CM | POA: Diagnosis present

## 2020-09-18 DIAGNOSIS — K922 Gastrointestinal hemorrhage, unspecified: Secondary | ICD-10-CM | POA: Diagnosis present

## 2020-09-18 DIAGNOSIS — Z66 Do not resuscitate: Secondary | ICD-10-CM | POA: Diagnosis not present

## 2020-09-18 DIAGNOSIS — J939 Pneumothorax, unspecified: Secondary | ICD-10-CM | POA: Diagnosis not present

## 2020-09-18 DIAGNOSIS — I491 Atrial premature depolarization: Secondary | ICD-10-CM | POA: Diagnosis not present

## 2020-09-18 DIAGNOSIS — M810 Age-related osteoporosis without current pathological fracture: Secondary | ICD-10-CM | POA: Diagnosis present

## 2020-09-18 DIAGNOSIS — D61818 Other pancytopenia: Secondary | ICD-10-CM | POA: Diagnosis not present

## 2020-09-18 DIAGNOSIS — R0902 Hypoxemia: Secondary | ICD-10-CM | POA: Diagnosis not present

## 2020-09-18 DIAGNOSIS — Z881 Allergy status to other antibiotic agents status: Secondary | ICD-10-CM

## 2020-09-18 DIAGNOSIS — I864 Gastric varices: Secondary | ICD-10-CM | POA: Diagnosis present

## 2020-09-18 DIAGNOSIS — Z8249 Family history of ischemic heart disease and other diseases of the circulatory system: Secondary | ICD-10-CM

## 2020-09-18 DIAGNOSIS — M549 Dorsalgia, unspecified: Secondary | ICD-10-CM | POA: Diagnosis present

## 2020-09-18 DIAGNOSIS — Z20822 Contact with and (suspected) exposure to covid-19: Secondary | ICD-10-CM | POA: Diagnosis present

## 2020-09-18 DIAGNOSIS — Z96652 Presence of left artificial knee joint: Secondary | ICD-10-CM | POA: Diagnosis present

## 2020-09-18 DIAGNOSIS — K920 Hematemesis: Secondary | ICD-10-CM | POA: Diagnosis not present

## 2020-09-18 DIAGNOSIS — I4510 Unspecified right bundle-branch block: Secondary | ICD-10-CM | POA: Diagnosis present

## 2020-09-18 DIAGNOSIS — K7581 Nonalcoholic steatohepatitis (NASH): Secondary | ICD-10-CM | POA: Diagnosis present

## 2020-09-18 DIAGNOSIS — K3189 Other diseases of stomach and duodenum: Secondary | ICD-10-CM | POA: Diagnosis present

## 2020-09-18 DIAGNOSIS — Z8551 Personal history of malignant neoplasm of bladder: Secondary | ICD-10-CM

## 2020-09-18 DIAGNOSIS — F32A Depression, unspecified: Secondary | ICD-10-CM | POA: Diagnosis present

## 2020-09-18 DIAGNOSIS — I119 Hypertensive heart disease without heart failure: Secondary | ICD-10-CM | POA: Diagnosis present

## 2020-09-18 DIAGNOSIS — E669 Obesity, unspecified: Secondary | ICD-10-CM | POA: Diagnosis present

## 2020-09-18 DIAGNOSIS — K7469 Other cirrhosis of liver: Secondary | ICD-10-CM | POA: Diagnosis not present

## 2020-09-18 DIAGNOSIS — E875 Hyperkalemia: Secondary | ICD-10-CM | POA: Diagnosis present

## 2020-09-18 DIAGNOSIS — R079 Chest pain, unspecified: Secondary | ICD-10-CM

## 2020-09-18 DIAGNOSIS — K649 Unspecified hemorrhoids: Secondary | ICD-10-CM | POA: Diagnosis present

## 2020-09-18 DIAGNOSIS — K7460 Unspecified cirrhosis of liver: Secondary | ICD-10-CM | POA: Diagnosis present

## 2020-09-18 DIAGNOSIS — R0789 Other chest pain: Secondary | ICD-10-CM | POA: Diagnosis not present

## 2020-09-18 DIAGNOSIS — Z6833 Body mass index (BMI) 33.0-33.9, adult: Secondary | ICD-10-CM

## 2020-09-18 DIAGNOSIS — Z86718 Personal history of other venous thrombosis and embolism: Secondary | ICD-10-CM

## 2020-09-18 DIAGNOSIS — D509 Iron deficiency anemia, unspecified: Secondary | ICD-10-CM | POA: Diagnosis present

## 2020-09-18 DIAGNOSIS — Z833 Family history of diabetes mellitus: Secondary | ICD-10-CM

## 2020-09-18 DIAGNOSIS — K219 Gastro-esophageal reflux disease without esophagitis: Secondary | ICD-10-CM | POA: Diagnosis present

## 2020-09-18 DIAGNOSIS — D696 Thrombocytopenia, unspecified: Secondary | ICD-10-CM | POA: Diagnosis present

## 2020-09-18 DIAGNOSIS — Z87891 Personal history of nicotine dependence: Secondary | ICD-10-CM

## 2020-09-18 DIAGNOSIS — I517 Cardiomegaly: Secondary | ICD-10-CM | POA: Diagnosis not present

## 2020-09-18 DIAGNOSIS — Z87440 Personal history of urinary (tract) infections: Secondary | ICD-10-CM

## 2020-09-18 DIAGNOSIS — D62 Acute posthemorrhagic anemia: Secondary | ICD-10-CM | POA: Diagnosis present

## 2020-09-18 DIAGNOSIS — Z880 Allergy status to penicillin: Secondary | ICD-10-CM

## 2020-09-18 DIAGNOSIS — F0390 Unspecified dementia without behavioral disturbance: Secondary | ICD-10-CM | POA: Diagnosis present

## 2020-09-18 DIAGNOSIS — Z8719 Personal history of other diseases of the digestive system: Secondary | ICD-10-CM

## 2020-09-18 DIAGNOSIS — I10 Essential (primary) hypertension: Secondary | ICD-10-CM | POA: Diagnosis not present

## 2020-09-18 DIAGNOSIS — M1990 Unspecified osteoarthritis, unspecified site: Secondary | ICD-10-CM | POA: Diagnosis present

## 2020-09-18 DIAGNOSIS — Z888 Allergy status to other drugs, medicaments and biological substances status: Secondary | ICD-10-CM

## 2020-09-18 DIAGNOSIS — Z91030 Bee allergy status: Secondary | ICD-10-CM

## 2020-09-18 DIAGNOSIS — I8511 Secondary esophageal varices with bleeding: Secondary | ICD-10-CM | POA: Diagnosis not present

## 2020-09-18 DIAGNOSIS — Z85038 Personal history of other malignant neoplasm of large intestine: Secondary | ICD-10-CM

## 2020-09-18 DIAGNOSIS — D689 Coagulation defect, unspecified: Secondary | ICD-10-CM | POA: Diagnosis present

## 2020-09-18 DIAGNOSIS — Z885 Allergy status to narcotic agent status: Secondary | ICD-10-CM

## 2020-09-18 LAB — I-STAT CHEM 8, ED
BUN: 29 mg/dL — ABNORMAL HIGH (ref 8–23)
Calcium, Ion: 1.53 mmol/L (ref 1.15–1.40)
Chloride: 104 mmol/L (ref 98–111)
Creatinine, Ser: 0.9 mg/dL (ref 0.44–1.00)
Glucose, Bld: 129 mg/dL — ABNORMAL HIGH (ref 70–99)
HCT: 32 % — ABNORMAL LOW (ref 36.0–46.0)
Hemoglobin: 10.9 g/dL — ABNORMAL LOW (ref 12.0–15.0)
Potassium: 5.7 mmol/L — ABNORMAL HIGH (ref 3.5–5.1)
Sodium: 137 mmol/L (ref 135–145)
TCO2: 27 mmol/L (ref 22–32)

## 2020-09-18 LAB — COMPREHENSIVE METABOLIC PANEL
ALT: 18 U/L (ref 0–44)
AST: 61 U/L — ABNORMAL HIGH (ref 15–41)
Albumin: 2.6 g/dL — ABNORMAL LOW (ref 3.5–5.0)
Alkaline Phosphatase: 80 U/L (ref 38–126)
Anion gap: 7 (ref 5–15)
BUN: 19 mg/dL (ref 8–23)
CO2: 24 mmol/L (ref 22–32)
Calcium: 11.2 mg/dL — ABNORMAL HIGH (ref 8.9–10.3)
Chloride: 104 mmol/L (ref 98–111)
Creatinine, Ser: 0.96 mg/dL (ref 0.44–1.00)
GFR, Estimated: 59 mL/min — ABNORMAL LOW (ref >60)
Glucose, Bld: 138 mg/dL — ABNORMAL HIGH (ref 70–99)
Potassium: 5.7 mmol/L — ABNORMAL HIGH (ref 3.5–5.1)
Sodium: 135 mmol/L (ref 135–145)
Total Bilirubin: 2 mg/dL — ABNORMAL HIGH (ref 0.3–1.2)
Total Protein: 4.9 g/dL — ABNORMAL LOW (ref 6.5–8.1)

## 2020-09-18 LAB — CBC WITH DIFFERENTIAL/PLATELET
Abs Immature Granulocytes: 0.01 10*3/uL (ref 0.00–0.07)
Basophils Absolute: 0 10*3/uL (ref 0.0–0.1)
Basophils Relative: 0 %
Eosinophils Absolute: 0 10*3/uL (ref 0.0–0.5)
Eosinophils Relative: 1 %
HCT: 30.3 % — ABNORMAL LOW (ref 36.0–46.0)
Hemoglobin: 9.7 g/dL — ABNORMAL LOW (ref 12.0–15.0)
Immature Granulocytes: 0 %
Lymphocytes Relative: 12 %
Lymphs Abs: 0.4 10*3/uL — ABNORMAL LOW (ref 0.7–4.0)
MCH: 32 pg (ref 26.0–34.0)
MCHC: 32 g/dL (ref 30.0–36.0)
MCV: 100 fL (ref 80.0–100.0)
Monocytes Absolute: 0.3 10*3/uL (ref 0.1–1.0)
Monocytes Relative: 10 %
Neutro Abs: 2.3 10*3/uL (ref 1.7–7.7)
Neutrophils Relative %: 77 %
Platelets: 44 10*3/uL — ABNORMAL LOW (ref 150–400)
RBC: 3.03 MIL/uL — ABNORMAL LOW (ref 3.87–5.11)
RDW: 16.4 % — ABNORMAL HIGH (ref 11.5–15.5)
WBC: 3 10*3/uL — ABNORMAL LOW (ref 4.0–10.5)
nRBC: 0 % (ref 0.0–0.2)

## 2020-09-18 LAB — TROPONIN I (HIGH SENSITIVITY): Troponin I (High Sensitivity): 5 ng/L (ref <18)

## 2020-09-18 LAB — LIPASE, BLOOD: Lipase: 32 U/L (ref 11–51)

## 2020-09-18 MED ORDER — ALUM & MAG HYDROXIDE-SIMETH 200-200-20 MG/5ML PO SUSP
30.0000 mL | Freq: Once | ORAL | Status: AC
  Administered 2020-09-18: 30 mL via ORAL
  Filled 2020-09-18: qty 30

## 2020-09-18 MED ORDER — SODIUM CHLORIDE 0.9 % IV BOLUS
1000.0000 mL | Freq: Once | INTRAVENOUS | Status: AC
  Administered 2020-09-18: 1000 mL via INTRAVENOUS

## 2020-09-18 MED ORDER — IOHEXOL 350 MG/ML SOLN
100.0000 mL | Freq: Once | INTRAVENOUS | Status: AC | PRN
  Administered 2020-09-18: 100 mL via INTRAVENOUS

## 2020-09-18 NOTE — ED Notes (Signed)
This RN attempted to insert a PIV twice unsuccessfully. RN Abran Richard to attempt when available.

## 2020-09-18 NOTE — ED Triage Notes (Signed)
Pt BIB EMS from Home with CP.  Pt was at home eating cake when pt verbalized pain in chest and back.  Pt not verbalizing any pain currently. Pt does have Dementia Baseline and is altered per normal.   Pt given 4 mg Zofran for Nausea.  VSS with EMS. GCS 14.

## 2020-09-18 NOTE — ED Provider Notes (Signed)
[facility] EMERGENCY DEPARTMENT Provider Note   CSN: [id number] Arrival date & time: 09/18/20  2002     History Chief Complaint  Patient presents with  . Chest Pain    [name] is a 79 y.o. female.  79 yo F with a chief complaints of left-sided chest pain that radiated to her back.  This was sudden and severe.  Occurred while she was eating food.  She has a history of dementia and is unable to provide any further history.  Level 5 caveat.  Her son was with her when she was with EMS.  Described her being in severe discomfort had an episode of emesis and diaphoresis with this.  Now seems to be much better.  Prior to this with no significant complaints.  No trauma no cough congestion or fever.  The history is provided by the patient.  Chest Pain Pain location:  L chest Pain quality: sharp   Pain radiates to:  Upper back Pain severity:  Severe Onset quality:  Sudden Duration:  2 hours Timing:  Rare Progression:  Resolved Chronicity:  New Relieved by:  Nothing Worsened by:  Nothing Ineffective treatments:  None tried Associated symptoms: back pain   Associated symptoms: no dizziness, no fever, no headache, no nausea, no palpitations, no shortness of breath and no vomiting        Past Medical History:  Diagnosis Date  . Anemia, iron deficiency   . Arthritis   . Bladder cancer [facility]) 2003   Bladder removed - [name]  . Cataract immature   . Cirrhosis ([place]) 08/26/2011  . Colon cancer [facility]) 2001   Hemicolectomy - [name]  . Depression   . GERD (gastroesophageal reflux disease)   . Headache(784.0)   . Hemorrhoids   . History of colon polyps   . History of DVT (deep vein thrombosis) 50+yrs ago   With pregnancy  . History of GI bleed   . History of kidney stones   . History of urostomy    Urostomy d/t hx bladder cancer  . Hx of transfusion of whole blood   . Hypertension   . Joint pain   . Memory loss    Chemotherapy; short and long  term  . Osteoporosis   . Thrombocytopenia [facility])     Patient Active Problem List   Diagnosis Date Noted  . Acute respiratory failure with hypoxia ([place]) 10/12/2019  . Acute lower UTI   . Pancytopenia ([place])   . Community acquired pneumonia   . Hypokalemia 11/02/2016  . Acute encephalopathy 11/02/2016  . Vasovagal syncope 11/02/2016  . Near syncope 11/02/2016  . Essential hypertension 09/11/2016  . Renal stones 05/04/2014  . Pyelonephritis, acute 04/06/2014  . Pyelonephritis 04/06/2014  . Nephrolithiasis 04/06/2014  . Presence of urostomy ([place]) 04/06/2014  . Sepsis ([place]) 02/28/2014  . Severe sepsis ([place]) 02/28/2014  . Hydronephrosis, left 02/28/2014  . E. coli pyelonephritis 02/28/2014  . Leukopenia 03/05/2012  . Varices, esophageal ([place]) 02/29/2012  . Acute upper GI bleeding 02/29/2012  . Gastric ulcer, acute with hemorrhage 02/29/2012  . Dementia without behavioral disturbance ([place]) 02/26/2012  . 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 Melena 02/26/2012  . Acute blood loss anemia 02/26/2012  . Diarrhea 02/26/2012  . Fracture of humerus 02/26/2012  . Ventral hernia without obstruction or gangrene 01/13/2012  . Colon cancer ([place]) 08/26/2011  . Bladder cancer ([place]) 08/26/2011  . Thrombocytopenia ([place]) 08/26/2011  . Anemia, iron deficiency 08/26/2011    Past  Surgical History:  Procedure Laterality Date  . ABDOMINAL HYSTERECTOMY    . APPENDECTOMY    . BLADDER SURGERY     bladder removed d/t cancer  . CHOLECYSTECTOMY    . COLON RESECTION     with colostomy then colostomy reversal  . COLON SURGERY  2004  . COLON SURGERY  2001  . COLONOSCOPY    . ESOPHAGOGASTRODUODENOSCOPY  02/27/2012   Procedure: ESOPHAGOGASTRODUODENOSCOPY (EGD);  Surgeon: [name], MD;  Location: [name];  Service: Endoscopy;  Laterality: N/A;  bedside case  . NEPHROLITHOTOMY Left 05/04/2014   Procedure: LEFT PERCUTANEOUS NEPHROLITHOTOMY ;  Surgeon: [name], MD;  Location: [facility];   Service: Urology;  Laterality: Left;  . TOTAL KNEE ARTHROPLASTY  2010   Left knee     OB History   No obstetric history on file.     Family History  Problem Relation Age of Onset  . Diabetes Brother   . Cancer Other   . Hypertension Other     Social History   Tobacco Use  . Smoking status: Former Smoker    Packs/day: 0.50    Years: 35.00    Pack years: 17.50    Types: Cigarettes    Start date: 10/25/1955    Quit date: 09/22/1991    Years since quitting: 29.0  . Smokeless tobacco: Never Used  . Tobacco comment: quit 23years ago  Vaping Use  . Vaping Use: Never used  Substance Use Topics  . Alcohol use: No    Alcohol/week: 0.0 standard drinks  . Drug use: No    Home Medications Prior to Admission medications   Medication Sig Start Date End Date Taking? Authorizing Provider  acidophilus (RISAQUAD) CAPS capsule Take 1 capsule by mouth daily.    [provider]  Apoaequorin 20 MG CAPS Take 20 mg by mouth daily.    [provider]  Ascorbic Acid (VITAMIN C) 1000 MG tablet Take 1,000 mg by mouth daily.    [provider]  cholecalciferol (VITAMIN D) 1000 units tablet Take 1,000 Units by mouth daily.    [provider]  Cinnamon 500 MG capsule Take 500 mg by mouth daily.    [provider]  citalopram (CELEXA) 40 MG tablet TAKE 1 TABLET BY MOUTH EVERY DAY Patient taking differently: Take 40 mg by mouth daily. 05/17/19   [name], MD  clotrimazole-betamethasone (LOTRISONE) cream Apply 1 application topically 2 (two) times daily. 11/03/19   [name], MD  doxycycline (VIBRAMYCIN) 100 MG capsule Take 1 capsule (100 mg total) by mouth 2 (two) times daily. 08/29/20   Noemi Chapel, MD  Ginkgo Biloba 60 MG TABS Take 60 mg by mouth daily.     [provider]  lactulose (CHRONULAC) 10 GM/15ML solution TAKE 30 ML BY MOUTH THREE TIMES DAILY Patient taking differently: Take 20 g by mouth 3 (three) times daily. 03/16/20    [name], MD  lidocaine (XYLOCAINE) 5 % ointment Apply topically 3 (three) times daily as needed (For hemorrhoids). 10/17/19   Alma Friendly, MD  losartan (COZAAR) 25 MG tablet TAKE 1 TABLET(25 MG) BY MOUTH DAILY Patient taking differently: Take 25 mg by mouth daily. 04/17/20   [name], MD  magic mouthwash w/lidocaine SOLN Take 5 mLs by mouth 3 (three) times daily as needed for mouth pain. 08/15/19   [place], Holli Humbles, NP  Multiple Vitamin (MULTIVITAMIN WITH MINERALS) TABS tablet Take 1 tablet by mouth daily.  [provider]  omega-3 acid ethyl esters (LOVAZA) 1 g capsule Take 1 g by mouth daily.    [provider]  pantoprazole (PROTONIX) 40 MG tablet TAKE 1 TABLET(40 MG) BY MOUTH DAILY 02/27/20   [name], MD  spironolactone (ALDACTONE) 50 MG tablet TAKE 1 TABLET(50 MG) BY MOUTH DAILY 02/11/20   [name], MD  vitamin B-12 (CYANOCOBALAMIN) 1000 MCG tablet Take 1,000 mcg by mouth daily.    [provider]  vitamin E 400 UNIT capsule Take 400 Units by mouth daily.    [provider]    Allergies    Bee venom, Codeine, Morphine and related, Phytonadione, Penicillins, and Promethazine hcl  Review of Systems   Review of Systems  Constitutional: Negative for chills and fever.  HENT: Negative for congestion and rhinorrhea.   Eyes: Negative for redness and visual disturbance.  Respiratory: Negative for shortness of breath and wheezing.   Cardiovascular: Positive for chest pain. Negative for palpitations.  Gastrointestinal: Negative for nausea and vomiting.  Genitourinary: Negative for dysuria and urgency.  Musculoskeletal: Positive for back pain. Negative for arthralgias and myalgias.  Skin: Negative for pallor and wound.  Neurological: Negative for dizziness and headaches.    Physical Exam Updated Vital Signs BP 111/90   Pulse (!) 59   Temp 98.2 F (36.8 C) (Oral)   Resp 12   Ht 5' 3"  (1.6 m)   Wt 86.2 kg    SpO2 99%   BMI 33.66 kg/m   Physical Exam Vitals and nursing note reviewed.  Constitutional:      General: She is not in acute distress.    Appearance: She is well-developed and well-nourished. She is not diaphoretic.  HENT:     Head: Normocephalic and atraumatic.  Eyes:     Extraocular Movements: EOM normal.     Pupils: Pupils are equal, round, and reactive to light.  Cardiovascular:     Rate and Rhythm: Normal rate and regular rhythm.     Heart sounds: No murmur heard. No friction rub. No gallop.   Pulmonary:     Effort: Pulmonary effort is normal.     Breath sounds: No wheezing or rales.  Chest:     Chest wall: No tenderness.  Abdominal:     General: There is no distension.     Palpations: Abdomen is soft.     Tenderness: There is no abdominal tenderness.  Musculoskeletal:        General: No tenderness or edema.     Cervical back: Normal range of motion and neck supple.  Skin:    General: Skin is warm and dry.  Neurological:     Mental Status: She is alert.  Psychiatric:        Mood and Affect: Mood and affect normal.        Behavior: Behavior normal.     ED Results / Procedures / Treatments   Labs (all labs ordered are listed, but only abnormal results are displayed) Labs Reviewed  CBC WITH DIFFERENTIAL/PLATELET - Abnormal; Notable for the following components:      Result Value   WBC 3.0 (*)    RBC 3.03 (*)    Hemoglobin 9.7 (*)    HCT 30.3 (*)    RDW 16.4 (*)    Platelets 44 (*)    Lymphs Abs 0.4 (*)    All other components within normal limits  COMPREHENSIVE METABOLIC PANEL - Abnormal; Notable for the following components:   Potassium  5.7 (*)    Glucose, Bld 138 (*)    Calcium 11.2 (*)    Total Protein 4.9 (*)    Albumin 2.6 (*)    AST 61 (*)    Total Bilirubin 2.0 (*)    GFR, Estimated 59 (*)    All other components within normal limits  I-STAT CHEM 8, ED - Abnormal; Notable for the following components:   Potassium 5.7 (*)    BUN 29 (*)     Glucose, Bld 129 (*)    Calcium, Ion 1.53 (*)    Hemoglobin 10.9 (*)    HCT 32.0 (*)    All other components within normal limits  LIPASE, BLOOD  TROPONIN I (HIGH SENSITIVITY)  TROPONIN I (HIGH SENSITIVITY)    EKG None  Radiology DG Chest Port 1 View  Result Date: 09/18/2020 CLINICAL DATA:  Chest pain. EXAM: PORTABLE CHEST 1 VIEW COMPARISON:  08/29/2020 FINDINGS: Cardiomegaly is not significantly changed. Stable aortic atherosclerosis and tortuosity. Minimal subpleural scarring in both lung bases. No acute airspace disease. No pleural fluid or pneumothorax. Bones are diffusely under mineralized. Chronic deformity of left proximal humerus. IMPRESSION: Stable cardiomegaly.  Bibasilar scarring.  No acute abnormality. Aortic Atherosclerosis (ICD10-I70.0). Electronically Signed   By: Keith Rake M.D.   On: 09/18/2020 21:22    Procedures Procedures (including critical care time)  Medications Ordered in ED Medications  sodium chloride 0.9 % bolus 1,000 mL (has no administration in time range)  alum & mag hydroxide-simeth (MAALOX/MYLANTA) 200-200-20 MG/5ML suspension 30 mL (30 mLs Oral Given 09/18/20 2151)  iohexol (OMNIPAQUE) 350 MG/ML injection 100 mL (100 mLs Intravenous Contrast Given 09/18/20 2331)    ED Course  I have reviewed the triage vital signs and the nursing notes.  Pertinent labs & imaging results that were available during my care of the patient were reviewed by me and considered in my medical decision making (see chart for details).    MDM Rules/Calculators/A&P                          79 yo F with a chief complaint of left-sided chest pain.  This was sharp and severe and radiated to the back.  Patient currently without symptoms.  I am unsure of exact etiology of her severe chest pain.  Reportedly was quite unbearable per the son.  As the patient is unable to provide any history will obtain a dissection study delta troponin reassess.  Signed out to Dr. Leonette Monarch,  please see his note for further details of care in the ED.   The patients results and plan were reviewed and discussed.   Any x-rays performed were independently reviewed by myself.   Differential diagnosis were considered with the presenting HPI.  Medications  sodium chloride 0.9 % bolus 1,000 mL (has no administration in time range)  alum & mag hydroxide-simeth (MAALOX/MYLANTA) 200-200-20 MG/5ML suspension 30 mL (30 mLs Oral Given 09/18/20 2151)  iohexol (OMNIPAQUE) 350 MG/ML injection 100 mL (100 mLs Intravenous Contrast Given 09/18/20 2331)    Vitals:   09/18/20 2050 09/18/20 2115 09/18/20 2155 09/18/20 2230  BP: 99/61 105/88 103/62 111/90  Pulse: 62 61 (!) 59 (!) 59  Resp:  11 16 12   Temp:      TempSrc:      SpO2: 99% 100% 99% 99%  Weight:      Height:        Final diagnoses:  Chest pain in adult  Final Clinical Impression(s) / ED Diagnoses Final diagnoses:  Chest pain in adult    Rx / DC Orders ED Discharge Orders    None       Deno Etienne, DO 09/19/20 0006

## 2020-09-19 ENCOUNTER — Encounter (HOSPITAL_COMMUNITY)
Admission: EM | Disposition: A | Discharge status: Home / Self Care | Payer: Self-pay | Source: Home / Self Care | Attending: Internal Medicine

## 2020-09-19 ENCOUNTER — Observation Stay (HOSPITAL_COMMUNITY): Payer: Medicare Other

## 2020-09-19 ENCOUNTER — Other Ambulatory Visit: Payer: Self-pay

## 2020-09-19 ENCOUNTER — Encounter (HOSPITAL_COMMUNITY): Payer: Self-pay | Admitting: Family Medicine

## 2020-09-19 ENCOUNTER — Observation Stay (HOSPITAL_COMMUNITY): Payer: Medicare Other | Admitting: Anesthesiology

## 2020-09-19 ENCOUNTER — Emergency Department (HOSPITAL_COMMUNITY): Payer: Medicare Other

## 2020-09-19 DIAGNOSIS — Z6833 Body mass index (BMI) 33.0-33.9, adult: Secondary | ICD-10-CM | POA: Diagnosis not present

## 2020-09-19 DIAGNOSIS — I8511 Secondary esophageal varices with bleeding: Secondary | ICD-10-CM | POA: Diagnosis present

## 2020-09-19 DIAGNOSIS — K920 Hematemesis: Secondary | ICD-10-CM | POA: Diagnosis present

## 2020-09-19 DIAGNOSIS — R079 Chest pain, unspecified: Secondary | ICD-10-CM | POA: Diagnosis not present

## 2020-09-19 DIAGNOSIS — Z888 Allergy status to other drugs, medicaments and biological substances status: Secondary | ICD-10-CM | POA: Diagnosis not present

## 2020-09-19 DIAGNOSIS — Z91030 Bee allergy status: Secondary | ICD-10-CM | POA: Diagnosis not present

## 2020-09-19 DIAGNOSIS — D689 Coagulation defect, unspecified: Secondary | ICD-10-CM | POA: Diagnosis present

## 2020-09-19 DIAGNOSIS — K3189 Other diseases of stomach and duodenum: Secondary | ICD-10-CM | POA: Diagnosis present

## 2020-09-19 DIAGNOSIS — D62 Acute posthemorrhagic anemia: Secondary | ICD-10-CM | POA: Diagnosis present

## 2020-09-19 DIAGNOSIS — F0390 Unspecified dementia without behavioral disturbance: Secondary | ICD-10-CM | POA: Diagnosis present

## 2020-09-19 DIAGNOSIS — E875 Hyperkalemia: Secondary | ICD-10-CM | POA: Diagnosis present

## 2020-09-19 DIAGNOSIS — D696 Thrombocytopenia, unspecified: Secondary | ICD-10-CM | POA: Diagnosis not present

## 2020-09-19 DIAGNOSIS — Z515 Encounter for palliative care: Secondary | ICD-10-CM | POA: Diagnosis not present

## 2020-09-19 DIAGNOSIS — K921 Melena: Secondary | ICD-10-CM | POA: Diagnosis not present

## 2020-09-19 DIAGNOSIS — J939 Pneumothorax, unspecified: Secondary | ICD-10-CM | POA: Insufficient documentation

## 2020-09-19 DIAGNOSIS — K922 Gastrointestinal hemorrhage, unspecified: Secondary | ICD-10-CM | POA: Diagnosis present

## 2020-09-19 DIAGNOSIS — Z66 Do not resuscitate: Secondary | ICD-10-CM | POA: Diagnosis present

## 2020-09-19 DIAGNOSIS — I864 Gastric varices: Secondary | ICD-10-CM | POA: Diagnosis present

## 2020-09-19 DIAGNOSIS — K254 Chronic or unspecified gastric ulcer with hemorrhage: Secondary | ICD-10-CM | POA: Diagnosis present

## 2020-09-19 DIAGNOSIS — K7460 Unspecified cirrhosis of liver: Secondary | ICD-10-CM | POA: Diagnosis present

## 2020-09-19 DIAGNOSIS — Z885 Allergy status to narcotic agent status: Secondary | ICD-10-CM | POA: Diagnosis not present

## 2020-09-19 DIAGNOSIS — R627 Adult failure to thrive: Secondary | ICD-10-CM | POA: Diagnosis not present

## 2020-09-19 DIAGNOSIS — Z8551 Personal history of malignant neoplasm of bladder: Secondary | ICD-10-CM | POA: Diagnosis not present

## 2020-09-19 DIAGNOSIS — K766 Portal hypertension: Secondary | ICD-10-CM | POA: Diagnosis present

## 2020-09-19 DIAGNOSIS — K625 Hemorrhage of anus and rectum: Secondary | ICD-10-CM | POA: Diagnosis not present

## 2020-09-19 DIAGNOSIS — M1990 Unspecified osteoarthritis, unspecified site: Secondary | ICD-10-CM | POA: Diagnosis present

## 2020-09-19 DIAGNOSIS — I8501 Esophageal varices with bleeding: Secondary | ICD-10-CM | POA: Diagnosis not present

## 2020-09-19 DIAGNOSIS — Z880 Allergy status to penicillin: Secondary | ICD-10-CM | POA: Diagnosis not present

## 2020-09-19 DIAGNOSIS — D61818 Other pancytopenia: Secondary | ICD-10-CM | POA: Diagnosis present

## 2020-09-19 DIAGNOSIS — E669 Obesity, unspecified: Secondary | ICD-10-CM | POA: Diagnosis present

## 2020-09-19 DIAGNOSIS — Z20822 Contact with and (suspected) exposure to covid-19: Secondary | ICD-10-CM | POA: Diagnosis present

## 2020-09-19 DIAGNOSIS — Z85038 Personal history of other malignant neoplasm of large intestine: Secondary | ICD-10-CM | POA: Diagnosis not present

## 2020-09-19 DIAGNOSIS — Z7189 Other specified counseling: Secondary | ICD-10-CM | POA: Diagnosis not present

## 2020-09-19 HISTORY — PX: ESOPHAGOGASTRODUODENOSCOPY: SHX5428

## 2020-09-19 HISTORY — PX: ESOPHAGEAL BANDING: SHX5518

## 2020-09-19 LAB — BPAM PLATELET PHERESIS
Blood Product Expiration Date: 202112292359
Unit Type and Rh: 8400

## 2020-09-19 LAB — CBC
HCT: 27.2 % — ABNORMAL LOW (ref 36.0–46.0)
HCT: 30.1 % — ABNORMAL LOW (ref 36.0–46.0)
HCT: 27.7 % — ABNORMAL LOW (ref 36.0–46.0)
Hemoglobin: 8.8 g/dL — ABNORMAL LOW (ref 12.0–15.0)
Hemoglobin: 9.7 g/dL — ABNORMAL LOW (ref 12.0–15.0)
Hemoglobin: 8.9 g/dL — ABNORMAL LOW (ref 12.0–15.0)
MCH: 32.5 pg (ref 26.0–34.0)
MCH: 32 pg (ref 26.0–34.0)
MCH: 32.7 pg (ref 26.0–34.0)
MCHC: 32.4 g/dL (ref 30.0–36.0)
MCHC: 32.2 g/dL (ref 30.0–36.0)
MCHC: 32.1 g/dL (ref 30.0–36.0)
MCV: 100.4 fL — ABNORMAL HIGH (ref 80.0–100.0)
MCV: 99.3 fL (ref 80.0–100.0)
MCV: 101.8 fL — ABNORMAL HIGH (ref 80.0–100.0)
Platelets: 56 10*3/uL — ABNORMAL LOW (ref 150–400)
Platelets: DECREASED 10*3/uL (ref 150–400)
Platelets: 64 10*3/uL — ABNORMAL LOW (ref 150–400)
RBC: 2.71 MIL/uL — ABNORMAL LOW (ref 3.87–5.11)
RBC: 3.03 MIL/uL — ABNORMAL LOW (ref 3.87–5.11)
RBC: 2.72 MIL/uL — ABNORMAL LOW (ref 3.87–5.11)
RDW: 16.4 % — ABNORMAL HIGH (ref 11.5–15.5)
RDW: 16.5 % — ABNORMAL HIGH (ref 11.5–15.5)
RDW: 16.9 % — ABNORMAL HIGH (ref 11.5–15.5)
WBC: 6 10*3/uL (ref 4.0–10.5)
WBC: 9.8 10*3/uL (ref 4.0–10.5)
WBC: 13.2 10*3/uL — ABNORMAL HIGH (ref 4.0–10.5)
nRBC: 0 % (ref 0.0–0.2)
nRBC: 0 % (ref 0.0–0.2)
nRBC: 0 % (ref 0.0–0.2)

## 2020-09-19 LAB — COMPREHENSIVE METABOLIC PANEL
ALT: 15 U/L (ref 0–44)
AST: 24 U/L (ref 15–41)
Albumin: 2.2 g/dL — ABNORMAL LOW (ref 3.5–5.0)
Alkaline Phosphatase: 67 U/L (ref 38–126)
Anion gap: 7 (ref 5–15)
BUN: 18 mg/dL (ref 8–23)
CO2: 23 mmol/L (ref 22–32)
Calcium: 10.6 mg/dL — ABNORMAL HIGH (ref 8.9–10.3)
Chloride: 107 mmol/L (ref 98–111)
Creatinine, Ser: 1.04 mg/dL — ABNORMAL HIGH (ref 0.44–1.00)
GFR, Estimated: 54 mL/min — ABNORMAL LOW (ref >60)
Glucose, Bld: 142 mg/dL — ABNORMAL HIGH (ref 70–99)
Potassium: 4.8 mmol/L (ref 3.5–5.1)
Sodium: 137 mmol/L (ref 135–145)
Total Bilirubin: 1.4 mg/dL — ABNORMAL HIGH (ref 0.3–1.2)
Total Protein: 4.4 g/dL — ABNORMAL LOW (ref 6.5–8.1)

## 2020-09-19 LAB — PREPARE PLATELET PHERESIS: Unit division: 0

## 2020-09-19 LAB — PROTIME-INR
INR: 1.8 — ABNORMAL HIGH (ref 0.8–1.2)
Prothrombin Time: 19.8 seconds — ABNORMAL HIGH (ref 11.4–15.2)

## 2020-09-19 LAB — RESP PANEL BY RT-PCR (FLU A&B, COVID) ARPGX2
Influenza A by PCR: NEGATIVE
Influenza B by PCR: NEGATIVE
SARS Coronavirus 2 by RT PCR: NEGATIVE

## 2020-09-19 LAB — TROPONIN I (HIGH SENSITIVITY): Troponin I (High Sensitivity): 6 ng/L (ref <18)

## 2020-09-19 SURGERY — EGD (ESOPHAGOGASTRODUODENOSCOPY)
Anesthesia: General

## 2020-09-19 MED ORDER — LIDOCAINE VISCOUS HCL 2 % MT SOLN
15.0000 mL | Freq: Once | OROMUCOSAL | Status: AC
  Administered 2020-09-19: 15 mL via OROMUCOSAL

## 2020-09-19 MED ORDER — PANTOPRAZOLE SODIUM 40 MG IV SOLR: 40.0000 mg | Freq: Two times a day (BID) | INTRAVENOUS | Status: DC

## 2020-09-19 MED ORDER — FENTANYL CITRATE (PF) 100 MCG/2ML IJ SOLN
12.5000 ug | INTRAMUSCULAR | Status: DC | PRN
  Administered 2020-09-19 – 2020-09-20 (×3): 25 ug via INTRAVENOUS
  Filled 2020-09-19 (×3): qty 2

## 2020-09-19 MED ORDER — OCTREOTIDE LOAD VIA INFUSION
50.0000 ug | Freq: Once | INTRAVENOUS | Status: AC
  Administered 2020-09-19: 50 ug via INTRAVENOUS
  Filled 2020-09-19: qty 25

## 2020-09-19 MED ORDER — LIDOCAINE 2% (20 MG/ML) 5 ML SYRINGE
INTRAMUSCULAR | Status: DC | PRN
  Administered 2020-09-19: 100 mg via INTRAVENOUS

## 2020-09-19 MED ORDER — ONDANSETRON HCL 4 MG PO TABS: 4.0000 mg | ORAL_TABLET | Freq: Four times a day (QID) | ORAL | Status: DC | PRN

## 2020-09-19 MED ORDER — PROPOFOL 10 MG/ML IV BOLUS
INTRAVENOUS | Status: DC | PRN
  Administered 2020-09-19: 50 mg via INTRAVENOUS
  Administered 2020-09-19: 100 mg via INTRAVENOUS

## 2020-09-19 MED ORDER — ONDANSETRON HCL 4 MG/2ML IJ SOLN
4.0000 mg | Freq: Four times a day (QID) | INTRAMUSCULAR | Status: DC | PRN
  Administered 2020-09-19: 4 mg via INTRAVENOUS
  Filled 2020-09-19: qty 2

## 2020-09-19 MED ORDER — FENTANYL CITRATE (PF) 100 MCG/2ML IJ SOLN
25.0000 ug | Freq: Once | INTRAMUSCULAR | Status: AC
  Administered 2020-09-19: 25 ug via INTRAVENOUS
  Filled 2020-09-19: qty 2

## 2020-09-19 MED ORDER — ONDANSETRON HCL 4 MG/2ML IJ SOLN
INTRAMUSCULAR | Status: AC
  Filled 2020-09-19: qty 2

## 2020-09-19 MED ORDER — CIPROFLOXACIN IN D5W 400 MG/200ML IV SOLN
400.0000 mg | Freq: Two times a day (BID) | INTRAVENOUS | Status: DC
  Administered 2020-09-19 – 2020-09-22 (×6): 400 mg via INTRAVENOUS
  Filled 2020-09-19 (×8): qty 200

## 2020-09-19 MED ORDER — SODIUM CHLORIDE 0.9 % IV SOLN: 1000.0000 mL | INTRAVENOUS | Status: DC

## 2020-09-19 MED ORDER — SODIUM CHLORIDE 0.9 % IV SOLN
50.0000 ug/h | INTRAVENOUS | Status: DC
  Administered 2020-09-19: 40 ug/h via INTRAVENOUS
  Administered 2020-09-19 – 2020-09-21 (×6): 50 ug/h via INTRAVENOUS
  Filled 2020-09-19 (×8): qty 1

## 2020-09-19 MED ORDER — SODIUM CHLORIDE 0.9% IV SOLUTION: Freq: Once | INTRAVENOUS | Status: DC

## 2020-09-19 MED ORDER — SODIUM CHLORIDE 0.9 % IV SOLN: INTRAVENOUS | Status: AC

## 2020-09-19 MED ORDER — PANTOPRAZOLE SODIUM 40 MG IV SOLR
40.0000 mg | Freq: Once | INTRAVENOUS | Status: AC
  Administered 2020-09-19: 40 mg via INTRAVENOUS
  Filled 2020-09-19: qty 40

## 2020-09-19 MED ORDER — SODIUM CHLORIDE 0.9% FLUSH
3.0000 mL | Freq: Two times a day (BID) | INTRAVENOUS | Status: DC
  Administered 2020-09-19 – 2020-09-22 (×8): 3 mL via INTRAVENOUS

## 2020-09-19 MED ORDER — SODIUM CHLORIDE 0.9 % IV SOLN: INTRAVENOUS | Status: DC

## 2020-09-19 MED ORDER — SUCCINYLCHOLINE CHLORIDE 200 MG/10ML IV SOSY
PREFILLED_SYRINGE | INTRAVENOUS | Status: DC | PRN
  Administered 2020-09-19: 70 mg via INTRAVENOUS

## 2020-09-19 MED ORDER — SODIUM CHLORIDE 0.9 % IV SOLN
80.0000 mg | Freq: Once | INTRAVENOUS | Status: AC
  Administered 2020-09-19: 80 mg via INTRAVENOUS
  Filled 2020-09-19 (×2): qty 80

## 2020-09-19 MED ORDER — PHENYLEPHRINE HCL-NACL 10-0.9 MG/250ML-% IV SOLN
INTRAVENOUS | Status: DC | PRN
  Administered 2020-09-19: 50 ug/min via INTRAVENOUS

## 2020-09-19 MED ORDER — LIDOCAINE VISCOUS HCL 2 % MT SOLN
OROMUCOSAL | Status: AC
  Filled 2020-09-19: qty 15

## 2020-09-19 MED ORDER — SODIUM CHLORIDE 0.9 % IV SOLN
8.0000 mg/h | INTRAVENOUS | Status: DC
  Administered 2020-09-19 – 2020-09-21 (×6): 8 mg/h via INTRAVENOUS
  Filled 2020-09-19 (×10): qty 80

## 2020-09-19 MED ORDER — SODIUM CHLORIDE 0.9 % IV BOLUS (SEPSIS): 1000.0000 mL | Freq: Once | INTRAVENOUS | Status: DC

## 2020-09-19 MED ORDER — LACTATED RINGERS IV SOLN: INTRAVENOUS | Status: DC

## 2020-09-19 NOTE — ED Notes (Signed)
Dr. Myna Hidalgo made aware pt vomiting bright red blood, 500cc

## 2020-09-19 NOTE — Op Note (Signed)
[facility] Patient Name: [name] Procedure Date : 09/19/2020 MRN: [id number] Attending MD: [name] , MD Date of Birth: [birth year] CSN: [id number] Age: 79 Admit Type: Emergency Department Procedure:                Upper GI endoscopy Indications:              Hematemesis, Hematochezia, For therapy of                            esophageal varices Providers:                [name], MD, Clyde Lundborg, RN, Cherylynn Ridges,                            Technician, Laureen Abrahams, Theodoro Grist, CRNA Referring MD:             [facility] Medicines:                Monitored Anesthesia Care Complications:            No immediate complications. Estimated blood loss:                            Minimal. Estimated Blood Loss:     Estimated blood loss was minimal. Procedure:                Pre-Anesthesia Assessment:                           - Prior to the procedure, a History and Physical                            was performed, and patient medications and                            allergies were reviewed. The patient's tolerance of                            previous anesthesia was also reviewed. The risks                            and benefits of the procedure and the sedation                            options and risks were discussed with the patient.                            All questions were answered, and informed consent                            was obtained. Prior Anticoagulants: The patient has                            taken no previous anticoagulant or antiplatelet  agents. ASA Grade Assessment: IV - A patient with                            severe systemic disease that is a constant threat                            to life. After reviewing the risks and benefits,                            the patient was deemed in satisfactory condition to                            undergo the procedure.                           After  obtaining informed consent, the endoscope was                            passed under direct vision. Throughout the                            procedure, the patient's blood pressure, pulse, and                            oxygen saturations were monitored continuously. The                            [id number]-H190 ([id number]) Olympus gastroscope was                            introduced through the mouth, and advanced to the                            second part of duodenum. The upper GI endoscopy was                            accomplished without difficulty. The patient                            tolerated the procedure well. Scope In: Scope Out: Findings:      Grade III varices were found in the middle third of the esophagus and in       the lower third of the esophagus. They were large in size. Seven bands       were deployed out of which 6 bands successfully placed with complete       eradication, resulting in deflation of varices. There was no bleeding at       the end of the procedure.One of the bands fell off after deployment, it       was placed in a varix with fibrin plug, bleeding was noted thereafter       and was immediately controlled with placement of another band at the       same site.      Hematin (altered blood/coffee-ground-like material) was found in the  cardia, in the gastric fundus, in the gastric body and in the gastric       antrum. Despite large volume lavage and use of simethicone to clear       bubbles, visualization was limited.      Varices with no bleeding were found in the gastric fundus. There were no       stigmata of recent bleeding. They were large in largest diameter.      The examined duodenum was normal.      Severe portal hypertensive gastropathy was found in the gastric body. Impression:               - Grade III esophageal varices. Completely                            eradicated. Banded.                           - Hematin (altered  blood/coffee-ground-like                            material) in the gastric antrum, in the cardia, in                            the gastric fundus and in the gastric body.                           - Gastric varices, without bleeding.                           - Normal examined duodenum.                           - Portal hypertensive gastropathy.                           - No specimens collected. Moderate Sedation:      Patient did not receive moderate sedation for this procedure, but       instead received monitored anesthesia care. Recommendation:           - NPO today.                           - Continue present medications.                           - Continue octreotide drip for total of 72 hours,                            continue protonix drip for a total of 24 hours and                            then switch to PPI BID.                           - Ideally, patient will benefit from TIPS for  treatment of gastric varices but due her age 65                            not be a candidate. Procedure Code(s):        --- Professional ---                           [id number], Esophagogastroduodenoscopy, flexible,                            transoral; with band ligation of esophageal/gastric                            varices Diagnosis Code(s):        --- Professional ---                           I85.00, Esophageal varices without bleeding                           K92.2, Gastrointestinal hemorrhage, unspecified                           I86.4, Gastric varices                           K76.6, Portal hypertension                           K31.89, Other diseases of stomach and duodenum                           K92.0, Hematemesis                           K92.1, Melena (includes Hematochezia) CPT copyright 2019 American [employer] Association. All rights reserved. The codes documented in this report are preliminary and upon coder review may  be revised to meet  current compliance requirements. [name], MD 09/19/2020 12:42:37 PM This report has been signed electronically. Number of Addenda: 0

## 2020-09-19 NOTE — Discharge Instructions (Addendum)
Discussed with your family doctor that your calcium is a bit higher than normal and see if they want to change your vitamin D dosage at home.  See if they want to change any of your other home medications.  Drink well for the next couple days.

## 2020-09-19 NOTE — ED Provider Notes (Signed)
I assumed care of this patient.  Please see previous provider note for further details of Hx, PE.  Briefly patient is a 79 y.o. female who presented for left chest pain.  EKG reassuring.  Initial troponin negative.  Awaiting CTA to rule out dissection.  Labs also notable for chronic hypercalcemia close to the patient's baseline.  Likely related to vitamin D intake.  12:46 AM  CTA negative for dissection.  It did reveal incidental small left pneumothorax. Discussed case with Dr. Roxy Manns from cardiothoracic surgery who recommended repeat chest x-ray in 6 hours.  If stable, patient would be appropriate for discharge home with close PCP follow-up in 1 week.  Son updated on plan.  1:27 AM Called into the patient's room for bright red blood hematemesis. Patient had 2 bouts of hematemesis while I was evaluating her.  On record review, patient was previously admitted for upper GI bleed in 2013.  Endoscopy at that time showed moderate esophageal varices and 2 gastric ulcers.  CT scan performed earlier showed evidence of gastritis. Likely cause of the hematemesis is gastritis or possible bleeding ulcer.  Just prior to hematemesis patient is complaining of the same chest pain.  It appears that her pain is related to GI etiology.  Protonix and zofran given. Additional IVF ordered.  Will call medicine for admission. GI consult in the morning for EGD.   Patient complained of back pain. CXR ordered and negative for PMx.     Fatima Blank, MD 09/19/20 [phone]

## 2020-09-19 NOTE — Interval H&P Note (Signed)
History and Physical Interval Note: 79/female with hematemesis and hematochezia, history of cirrhosis and esophageal varices.  09/19/2020 11:56 AM  [name]  has presented today for EGD with possible banding, with the diagnosis of hematemesis, history of esophageal varices.  The various methods of treatment have been discussed with the patient and family. After consideration of risks, benefits and other options for treatment, the patient has consented to  Procedure(s): ESOPHAGOGASTRODUODENOSCOPY (EGD) (N/A) as a surgical intervention.  The patient's history has been reviewed, patient examined, no change in status, stable for surgery.  I have reviewed the patient's chart and labs.  Questions were answered to the patient's satisfaction.     [name]

## 2020-09-19 NOTE — ED Notes (Signed)
Per provider. Give  1L of fluids at a time. Not all at once. 1st L currently running.

## 2020-09-19 NOTE — Anesthesia Preprocedure Evaluation (Addendum)
Anesthesia Evaluation  Patient identified by MRN, date of birth, ID band Patient confused    Reviewed: Allergy & Precautions, NPO status , Patient's Chart, lab work & pertinent test results  Airway Mallampati: II  TM Distance: >3 FB Neck ROM: Full    Dental no notable dental hx. (+) Upper Dentures,    Pulmonary former smoker,    Pulmonary exam normal breath sounds clear to auscultation       Cardiovascular hypertension, Normal cardiovascular exam Rhythm:Regular Rate:Normal  EKG SR W RBB   Neuro/Psych PSYCHIATRIC DISORDERS Depression Dementia    GI/Hepatic PUD, GERD  ,(+) Cirrhosis   Esophageal Varices    , Lab Results      Component                Value               Date                      ALT                      15                  09/19/2020                AST                      24                  09/19/2020                ALKPHOS                  67                  09/19/2020                BILITOT                  1.4 (H)             09/19/2020            Hx of colon CA   Endo/Other    Renal/GU Renal diseaseLab Results      Component                Value               Date                      CREATININE               1.04 (H)            09/19/2020                BUN                      18                  09/19/2020                NA                       137                 09/19/2020  K                        4.8                 09/19/2020                CL                       107                 09/19/2020                CO2                      23                  09/19/2020                Musculoskeletal  (+) Arthritis ,   Abdominal   Peds  Hematology  (+) anemia , Lab Results      Component                Value               Date                      WBC                      9.8                 09/19/2020                HGB                      9.7 (L)              09/19/2020                HCT                      30.1 (L)            09/19/2020                MCV                      99.3                09/19/2020                PLT                                          09/19/2020            PLATELET CLUMPS NOTED ON SMEAR, COUNT APPEARS DECREASED    Anesthesia Other Findings   Reproductive/Obstetrics                            Anesthesia Physical Anesthesia Plan  ASA: IV and emergent  Anesthesia Plan: General   Post-op Pain Management:    Induction: Intravenous and Cricoid pressure planned  PONV Risk Score and Plan: 3 and Treatment may vary due to age or medical  condition and Ondansetron  Airway Management Planned: Oral ETT  Additional Equipment: None  Intra-op Plan:   Post-operative Plan: Extubation in OR  Informed Consent: I have reviewed the patients History and Physical, chart, labs and discussed the procedure including the risks, benefits and alternatives for the proposed anesthesia with the patient or authorized representative who has indicated his/her understanding and acceptance.   Patient has DNR.  Discussed DNR with power of attorney.   Dental advisory given and Consent reviewed with POA  Plan Discussed with: CRNA and Anesthesiologist  Anesthesia Plan Comments: (Discussed DNR with Husband POA. He will allow [place] to reverse the effects of anesthesia for the procedure.)      Anesthesia Quick Evaluation

## 2020-09-19 NOTE — Anesthesia Postprocedure Evaluation (Signed)
Anesthesia Post Note  Patient: [name]  Procedure(s) Performed: ESOPHAGOGASTRODUODENOSCOPY (EGD) (N/A ) ESOPHAGEAL BANDING     Patient location during evaluation: PACU Anesthesia Type: General Level of consciousness: awake and alert Pain management: pain level controlled Vital Signs Assessment: post-procedure vital signs reviewed and stable Respiratory status: spontaneous breathing, nonlabored ventilation, respiratory function stable and patient connected to nasal cannula oxygen Cardiovascular status: blood pressure returned to baseline and stable Postop Assessment: no apparent nausea or vomiting Anesthetic complications: no   No complications documented.  Last Vitals:  Vitals:   09/19/20 1305 09/19/20 1320  BP: (!) 198/154 (!) 151/74  Pulse: 88   Resp: (!) 21   Temp:    SpO2: 100%     Last Pain:  Vitals:   09/19/20 1251  TempSrc: Temporal  PainSc:                  [place]et [place]

## 2020-09-19 NOTE — Op Note (Signed)
EGD was performed for hematemesis and therapy of esophageal varices in a patient with history of cirrhosis  Findings: Large esophageal varices noted. Total of seven bands were deployed out of which 6 stayed in place. Evidence of fibrin plug noted in one of the varix, where the band fell off, but was immediately treated with placement of another band for control of bleeding.  A large amount of coffee-ground material, old blood noted in the stomach which made visualization difficult despite large-volume lavage and use of simethicone.  Gastric varices noted on retroflexion without evidence of active bleeding.  No evidence of active bleeding otherwise.   Recommendations: Continue octreotide drip for a total of 72 hours, plan to switch to nonselective beta-blocker thereafter if tolerated. Continue Protonix drip for a total of 24 hours, switch to PPI twice daily thereafter. Keep patient n.p.o. today, start clear liquids tomorrow.  If there is evidence of rebleeding patient will need IR evaluation for TIPS. Ideally patient would benefit from TIPS for treatment of gastric varices, however may not be a candidate for elective TIPS placement due to her age. We will continue to follow.  Monitor H&H and transfuse to keep hemoglobin above 7.

## 2020-09-19 NOTE — Brief Op Note (Signed)
09/18/2020 - 09/19/2020  12:42 PM  PATIENT:  [name]  79 y.o. female  PRE-OPERATIVE DIAGNOSIS:  hematemesis, history of esophageal varices  POST-OPERATIVE DIAGNOSIS:  esophageal varices, gastric varices,  PROCEDURE:  Procedure(s): ESOPHAGOGASTRODUODENOSCOPY (EGD) (N/A) ESOPHAGEAL BANDING  SURGEON:  Surgeon(s) and Role:    [name], MD - Primary  PHYSICIAN ASSISTANT:   ASSISTANTS: Delon [place]   ANESTHESIA:   MAC  EBL:  Minimal  BLOOD ADMINISTERED:none  DRAINS: none   LOCAL MEDICATIONS USED:  NONE  SPECIMEN:  No Specimen  DISPOSITION OF SPECIMEN:  N/A  COUNTS:  YES  TOURNIQUET:  * No tourniquets in log *  DICTATION: .Dragon Dictation  PLAN OF CARE: Admit to inpatient   PATIENT DISPOSITION:  PACU - hemodynamically stable.   Delay start of Pharmacological VTE agent (>24hrs) due to surgical blood loss or risk of bleeding: not applicable

## 2020-09-19 NOTE — Anesthesia Procedure Notes (Signed)
Procedure Name: Intubation Date/Time: 09/19/2020 12:14 PM Performed by: Lavell Luster, CRNA Pre-anesthesia Checklist: Patient identified, Emergency Drugs available, Suction available, Patient being monitored and Timeout performed Patient Re-evaluated:Patient Re-evaluated prior to induction Oxygen Delivery Method: Circle system utilized Preoxygenation: Pre-oxygenation with 100% oxygen Induction Type: IV induction, Rapid sequence and Cricoid Pressure applied Laryngoscope Size: Mac, 3 and Glidescope Grade View: Grade I Tube type: Oral Tube size: 7.5 mm Number of attempts: 1 Airway Equipment and Method: Stylet and Video-laryngoscopy Placement Confirmation: ETT inserted through vocal cords under direct vision,  breath sounds checked- equal and bilateral and positive ETCO2 Secured at: 22 cm Tube secured with: Tape Dental Injury: Teeth and Oropharynx as per pre-operative assessment

## 2020-09-19 NOTE — Transfer of Care (Signed)
Immediate Anesthesia Transfer of Care Note  Patient: [name]  Procedure(s) Performed: ESOPHAGOGASTRODUODENOSCOPY (EGD) (N/A ) ESOPHAGEAL BANDING  Patient Location: PACU  Anesthesia Type:General  Level of Consciousness: awake  Airway & Oxygen Therapy: Patient Spontanous Breathing and Patient connected to nasal cannula oxygen  Post-op Assessment: Report given to RN and Post -op Vital signs reviewed and stable  Post vital signs: Reviewed and stable  Last Vitals:  Vitals Value Taken Time  BP 111/85 09/19/20 1256  Temp 36.5 C 09/19/20 1251  Pulse 90 09/19/20 1259  Resp 21 09/19/20 1259  SpO2 91 % 09/19/20 1259  Vitals shown include unvalidated device data.  Last Pain:  Vitals:   09/19/20 1251  TempSrc: Temporal  PainSc:          Complications: No complications documented.

## 2020-09-19 NOTE — ED Notes (Signed)
Dr. Myna Hidalgo made aware pt having active rectal bleeding

## 2020-09-19 NOTE — Consult Note (Addendum)
Referring Provider: ED Primary Care Physician:  [name], MD Primary Gastroenterologist:  Dr. [name])  Reason for Consultation:  Hematemesis  HPI: [name] is a 79 y.o. female with history of NASH cirrhosis, esophageal varices, colon cancer, advanced adnoma (s/p 2 colonic resections) presenting for consultation of hematemesis.  Patient was brought into the ED by EMS yesterday due to complaints of chest pain.  While in the ED, patient had several episodes of hematemesis.  She has also had several episodes of hematochezia as well.  Denies any abdominal pain or nausea at this time.  Patient with dementia, unable to provide history.  She is also lethargic.  Husband provides most of history.  He states patient is very lethargic at home as well.  Husband states patient has been more lethargic over the past couple of weeks.  He states she was hospitalized earlier in December for UTI and CAP, and he notes that patient had 2 liquid black stools while on antibiotics.  However, this resolved and she has been having brown stools.  He states she has a poor appetite and typically only eats things that she likes to eat, such as sweets.  He believes she may have lost some weight but he is unsure.  No blood thinner or aspirin use.  No prior history of GI bleeding, though does have a history of esophageal varices seen on EGD.  Last EGD in 2015 revealed Grade 2 esophageal varices, as well as gastric varices and portal hypertensive gastropathy   Past Medical History:  Diagnosis Date  . Anemia, iron deficiency   . Arthritis   . Bladder cancer [facility]) 2003   Bladder removed - [name]  . Cataract immature   . Cirrhosis ([place]) 08/26/2011  . Colon cancer [facility]) 2001   Hemicolectomy - [name]  . Depression   . GERD (gastroesophageal reflux disease)   . Headache(784.0)   . Hemorrhoids   . History of colon polyps   . History of DVT (deep vein thrombosis) 50+yrs ago   With pregnancy   . History of GI bleed   . History of kidney stones   . History of urostomy    Urostomy d/t hx bladder cancer  . Hx of transfusion of whole blood   . Hypertension   . Joint pain   . Memory loss    Chemotherapy; short and long term  . Osteoporosis   . Thrombocytopenia ([place])     Past Surgical History:  Procedure Laterality Date  . ABDOMINAL HYSTERECTOMY    . APPENDECTOMY    . BLADDER SURGERY     bladder removed d/t cancer  . CHOLECYSTECTOMY    . COLON RESECTION     with colostomy then colostomy reversal  . COLON SURGERY  2004  . COLON SURGERY  2001  . COLONOSCOPY    . ESOPHAGOGASTRODUODENOSCOPY  02/27/2012   Procedure: ESOPHAGOGASTRODUODENOSCOPY (EGD);  Surgeon: [name], MD;  Location: [name];  Service: Endoscopy;  Laterality: N/A;  bedside case  . NEPHROLITHOTOMY Left 05/04/2014   Procedure: LEFT PERCUTANEOUS NEPHROLITHOTOMY ;  Surgeon: [name], MD;  Location: [facility];  Service: Urology;  Laterality: Left;  . TOTAL KNEE ARTHROPLASTY  2010   Left knee    Prior to Admission medications   Medication Sig Start Date End Date Taking? Authorizing Provider  acidophilus (RISAQUAD) CAPS capsule Take 1 capsule by mouth daily.   Yes [provider]  Apoaequorin 20 MG CAPS Take 20 mg  by mouth daily.   Yes [provider]  Ascorbic Acid (VITAMIN C) 1000 MG tablet Take 1,000 mg by mouth daily.   Yes [provider]  cholecalciferol (VITAMIN D) 1000 units tablet Take 1,000 Units by mouth daily.   Yes [provider]  citalopram (CELEXA) 40 MG tablet TAKE 1 TABLET BY MOUTH EVERY DAY Patient taking differently: Take 40 mg by mouth daily. 05/17/19  Yes [name], MD  Ginkgo Biloba 60 MG TABS Take 60 mg by mouth daily.    Yes [provider]  lactulose (CHRONULAC) 10 GM/15ML solution TAKE 30 ML BY MOUTH THREE TIMES DAILY Patient taking differently: Take 20 g by mouth 3 (three) times daily. 03/16/20  Yes Ennever, Rudell Cobb, MD   losartan (COZAAR) 25 MG tablet TAKE 1 TABLET(25 MG) BY MOUTH DAILY Patient taking differently: Take 25 mg by mouth daily. 04/17/20  Yes [name], MD  magic mouthwash w/lidocaine SOLN Take 5 mLs by mouth 3 (three) times daily as needed for mouth pain. 08/15/19  Yes [place], Holli Humbles, NP  Multiple Vitamin (MULTIVITAMIN WITH MINERALS) TABS tablet Take 1 tablet by mouth daily.   Yes [provider]  pantoprazole (PROTONIX) 40 MG tablet TAKE 1 TABLET(40 MG) BY MOUTH DAILY Patient taking differently: Take 40 mg by mouth daily. 02/27/20  Yes [name], MD  spironolactone (ALDACTONE) 50 MG tablet TAKE 1 TABLET(50 MG) BY MOUTH DAILY Patient taking differently: Take 50 mg by mouth daily. 02/11/20  Yes Ennever, Rudell Cobb, MD  vitamin B-12 (CYANOCOBALAMIN) 1000 MCG tablet Take 1,000 mcg by mouth daily.   Yes [provider]  doxycycline (VIBRAMYCIN) 100 MG capsule Take 1 capsule (100 mg total) by mouth 2 (two) times daily. Patient not taking: Reported on 09/19/2020 08/29/20   Noemi Chapel, MD    Scheduled Meds: . sodium chloride   Intravenous Once  . ondansetron      . [START ON 09/22/2020] pantoprazole  40 mg Intravenous Q12H  . sodium chloride flush  3 mL Intravenous Q12H   Continuous Infusions: . sodium chloride 125 mL/hr at 09/19/20 0715  . octreotide  (SANDOSTATIN)    IV infusion 50 mcg/hr (09/19/20 0442)  . pantoprozole (PROTONIX) infusion 8 mg/hr (09/19/20 0517)   PRN Meds:.fentaNYL (SUBLIMAZE) injection, ondansetron **OR** ondansetron (ZOFRAN) IV  Allergies as of 09/18/2020 - Review Complete 09/18/2020  Allergen Reaction Noted  . Bee venom Anaphylaxis 03/14/2017  . Codeine Anaphylaxis and Rash 09/22/2011  . Morphine and related Anaphylaxis and Rash 09/22/2011  . Phytonadione Other (See Comments) 04/07/2014  . Penicillins Rash and Other (See Comments) 04/25/2013  . Promethazine hcl Other (See Comments) 09/25/2011    Family History  Problem Relation Age  of Onset  . Diabetes Brother   . Cancer Other   . Hypertension Other     Social History   Socioeconomic History  . Marital status: Married    Spouse name: Not on file  . Number of children: Not on file  . Years of education: Not on file  . Highest education level: Not on file  Occupational History  . Not on file  Tobacco Use  . Smoking status: Former Smoker    Packs/day: 0.50    Years: 35.00    Pack years: 17.50    Types: Cigarettes    Start date: 10/25/1955    Quit date: 09/22/1991    Years since quitting: 29.0  . Smokeless tobacco: Never Used  . Tobacco comment: quit 23years ago  Vaping Use  . Vaping Use: Never used  Substance and Sexual Activity  . Alcohol use: No    Alcohol/week: 0.0 standard drinks  . Drug use: No  . Sexual activity: Yes    Birth control/protection: Surgical  Other Topics Concern  . Not on file  Social History Narrative   Lives at home with her husband, ambulatory   Social Determinants of Health   Financial Resource Strain: Not on file  Food Insecurity: Not on file  Transportation Needs: Not on file  Physical Activity: Not on file  Stress: Not on file  Social Connections: Not on file  Intimate Partner Violence: Not on file    Review of Systems: Review of Systems  Constitutional: Positive for malaise/fatigue. Negative for chills and fever.  HENT: Negative for sinus pain and sore throat.   Eyes: Negative for pain and redness.  Respiratory: Negative for cough and shortness of breath.   Cardiovascular: Negative for chest pain and palpitations.  Gastrointestinal: Positive for blood in stool, nausea and vomiting. Negative for abdominal pain, constipation, diarrhea, heartburn and melena.  Genitourinary: Negative for flank pain and hematuria.  Musculoskeletal: Negative for falls and joint pain.  Skin: Negative for itching and rash.  Neurological: Negative for seizures and loss of consciousness.  Endo/Heme/Allergies: Negative for polydipsia.  Does not bruise/bleed easily.  Psychiatric/Behavioral: Positive for memory loss. Negative for substance abuse.    Physical Exam: Vital signs: Vitals:   09/19/20 0830 09/19/20 0833  BP:    Pulse: 88   Resp:  (!) 22  Temp:    SpO2: 96%      Physical Exam Vitals reviewed.  Constitutional:      General: She is not in acute distress.    Appearance: She is ill-appearing.  HENT:     Head: Normocephalic and atraumatic.     Nose: Nose normal. No congestion.     Mouth/Throat:     Mouth: Mucous membranes are moist.     Pharynx: Oropharynx is clear.  Eyes:     General: No scleral icterus.    Extraocular Movements: Extraocular movements intact.  Cardiovascular:     Rate and Rhythm: Normal rate and regular rhythm.     Pulses: Normal pulses.  Pulmonary:     Effort: Pulmonary effort is normal. No respiratory distress.     Breath sounds: Normal breath sounds.  Abdominal:     General: Bowel sounds are normal. There is no distension.     Palpations: Abdomen is soft. There is no mass.     Tenderness: There is no abdominal tenderness. There is no guarding or rebound.     Hernia: No hernia is present.     Comments: Large red bowel movement noted on chuck pad  Musculoskeletal:        General: No swelling or tenderness.     Cervical back: Normal range of motion and neck supple.  Skin:    General: Skin is warm and dry.  Neurological:     General: No focal deficit present.     Mental Status: She is oriented to person, place, and time. She is lethargic.  Psychiatric:        Mood and Affect: Mood normal.        Behavior: Behavior normal.      GI:  Lab Results: Recent Labs    09/18/20 2102 09/18/20 2201 09/19/20 0301 09/19/20 0802  WBC 3.0*  --  6.0 9.8  HGB 9.7* 10.9* 8.8* 9.7*  HCT 30.3* 32.0*  27.2* 30.1*  PLT 44*  --  56* PENDING   BMET Recent Labs    09/18/20 2102 09/18/20 2201 09/19/20 0301  NA 135 137 137  K 5.7* 5.7* 4.8  CL 104 104 107  CO2 24  --  23   GLUCOSE 138* 129* 142*  BUN 19 29* 18  CREATININE 0.96 0.90 1.04*  CALCIUM 11.2*  --  10.6*   LFT Recent Labs    09/19/20 0301  PROT 4.4*  ALBUMIN 2.2*  AST 24  ALT 15  ALKPHOS 67  BILITOT 1.4*   PT/INR Recent Labs    09/19/20 0301  LABPROT 19.8*  INR 1.8*     Studies/Results: DG Chest 2 View  Result Date: 09/19/2020 CLINICAL DATA:  79 year old female with small left pneumothorax on chest CTA 09/18/2020. EXAM: CHEST - 2 VIEW COMPARISON:  Portable chest 09/19/2020 and earlier. FINDINGS: Semi upright AP and lateral views of the chest. Stable lung volumes. Stable mediastinal contours with tortuous thoracic aorta. Visualized tracheal air column is within normal limits. No pulmonary edema. No evidence of pleural effusion. No progression of the trace left lung base pneumothorax visible by CTA but not evident on subsequent radiographs. No acute osseous abnormality identified. Paucity of bowel gas in the upper abdomen. IMPRESSION: 1. No progression of the trace left lung base pneumothorax, visible on recent CTA but not evident on subsequent radiographs. 2. No new cardiopulmonary abnormality. Electronically Signed   By: Genevie Ann M.D.   On: 09/19/2020 06:36   DG Chest Port 1 View  Result Date: 09/19/2020 CLINICAL DATA:  Chest pain EXAM: PORTABLE CHEST 1 VIEW COMPARISON:  September 18, 2020 FINDINGS: The heart size and mediastinal contours are mildly enlarged. Aortic knob calcifications are seen. Both lungs are clear. The visualized skeletal structures are unremarkable. IMPRESSION: No active disease. Electronically Signed   By: Prudencio Pair M.D.   On: 09/19/2020 02:10   DG Chest Port 1 View  Result Date: 09/18/2020 CLINICAL DATA:  Chest pain. EXAM: PORTABLE CHEST 1 VIEW COMPARISON:  08/29/2020 FINDINGS: Cardiomegaly is not significantly changed. Stable aortic atherosclerosis and tortuosity. Minimal subpleural scarring in both lung bases. No acute airspace disease. No pleural fluid or  pneumothorax. Bones are diffusely under mineralized. Chronic deformity of left proximal humerus. IMPRESSION: Stable cardiomegaly.  Bibasilar scarring.  No acute abnormality. Aortic Atherosclerosis (ICD10-I70.0). Electronically Signed   By: Keith Rake M.D.   On: 09/18/2020 21:22   CT Angio Chest/Abd/Pel for Dissection W and/or Wo Contrast  Result Date: 09/19/2020 CLINICAL DATA:  Abdominal pain, question dissection EXAM: CT ANGIOGRAPHY CHEST, ABDOMEN AND PELVIS TECHNIQUE: Non-contrast CT of the chest was initially obtained. Multidetector CT imaging through the chest, abdomen and pelvis was performed using the standard protocol during bolus administration of intravenous contrast. Multiplanar reconstructed images and MIPs were obtained and reviewed to evaluate the vascular anatomy. CONTRAST:  144m OMNIPAQUE IOHEXOL 350 MG/ML SOLN COMPARISON:  None. FINDINGS: CTA CHEST FINDINGS Cardiovascular: --Heart: The heart size is normal.  There is nopericardial effusion. --Aorta: The course and caliber of the thoracic aorta are normal. There is scattered aortic atherosclerotic calcification. Precontrast images show no aortic intramural hematoma. There is no blood pool, dissection or penetrating ulcer demonstrated on arterial phase postcontrast imaging. There is a conventional 3 vessel aortic arch branching pattern. The proximal arch vessels are widely patent. --Pulmonary Arteries: Contrast timing is optimized for preferential opacification of the aorta. Within that limitation, normal central pulmonary arteries. Mediastinum/Nodes: No mediastinal, hilar or axillary lymphadenopathy. The  visualized thyroid and thoracic esophageal course are unremarkable. Lungs/Pleura: There is a small left basilar pneumothorax present. No mediastinal shift. No pleural effusion. Musculoskeletal: No chest wall abnormality. No acute osseous findings. Review of the MIP images confirms the above findings. CTA ABDOMEN AND PELVIS FINDINGS  VASCULAR Aorta: Normal caliber aorta without aneurysm, dissection, vasculitis or hemodynamically significant stenosis. There is scattered aortic atherosclerosis. Celiac: No aneurysm, dissection or hemodynamically significant stenosis. Normal branching pattern SMA: There is mild stenosis at the origin of the SMA due to atherosclerosis. Renals: Single renal arteries bilaterally. No aneurysm, dissection, stenosis or evidence of fibromuscular dysplasia. IMA: Patent without abnormality. Inflow: No aneurysm, stenosis or dissection. Veins: Normal course and caliber of the major veins. Assessment is otherwise limited by the arterial dominant contrast phase. Review of the MIP images confirms the above findings. NON-VASCULAR Hepatobiliary: There is a slightly shrunken nodular liver contour seen throughout. The patient is status post cholecystectomy. Again noted is a tiny 5 mm hypodense lesion in the posterior right liver lobe. 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 Pancreas: Normal contours without ductal dilatation. No peripancreatic fluid collection. Spleen: Normal arterial phase splenic enhancement pattern. There is mild splenomegaly measuring 14 cm in craniocaudad dimension. Adrenals/Urinary Tract: --Adrenal glands: Normal. Kidneys: A small amount of air seen within the bilateral renal pelvis which could be due to recent catheter. No hydronephrosis or collecting system calculi. 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 Stomach/Bowel: --Stomach/Duodenum: A small hiatal hernia is present. There appears to be question of fat stranding changes seen at the gastroduodenal junction. --Small bowel: No dilatation or inflammation. --Colon: Scattered colonic diverticula are noted. The patient is status post cholecystectomy. A large amount  of rectal colonic stool is present. Lymphatic: No abdominal or pelvic lymphadenopathy. A small amount abdominopelvic ascites is present. Reproductive: No free fluid in the pelvis. Musculoskeletal. No bony spinal canal stenosis or focal osseous abnormality. There is diffuse osteopenia. Other: None. Review of the MIP images confirms the above findings. IMPRESSION: 1. No acute aortic abnormality. 2. Small left basilar pneumothorax. 3. Small amount of abdominopelvic ascites. 4. Findings which may be suggestive of gastritis at the gastroduodenal junction. 5. Findings of cirrhosis and portal hypertension 6. Chronic portal vein/splenic confluence thrombosis. 7.  Aortic Atherosclerosis (ICD10-I70.0). 8. These results were called by telephone at the time of interpretation on 09/19/2020 at 12:06 am to provider DAN FLOYD , who verbally acknowledged these results. Electronically Signed   By: Prudencio Pair M.D.   On: 09/19/2020 00:05    Impression: Upper GI bleeding: Concern for esophageal variceal bleeding.  Bleeding appears to be brisk, as exhibited by bright red blood per rectum in addition to hematemesis.  Surprisingly, BUN is normal (18) today and was only mildly elevated to 29 yesterday.   -Hemoglobin 9.7 this morning, improved from 8.8 earlier this morning.  Baseline hemoglobin 11-12 -INR 1.8 -CT 12/28: Findings which may be suggestive of gastritis at the gastroduodenal junction. -History of esophageal varices, noted on 2015 EGD -Patient currently stable with SBP 105-110, DBP 70-80, HR 90s  NASH Cirrhosis, MELD score of 15 as of 09/19/20  Dementia  Plan: Recommend urgent EGD today.  I thoroughly discussed this with patient's husband at bedside, to include nature, alternatives, benefits, and risks (including but not limited to bleeding, infection, perforation, anesthesia/cardiac and pulmonary complications).  Patient's  husband verbalized understanding and gave verbal consent to proceed with EGD.  EGD  planned for 11:30, which is the earliest available time in the endoscopy unit.  Continue Protonix and octreotide drips.  Initiate Cipro 400 mg IV BID.  This was chosen as an alternative to Ceftriaxone, as patient has history of rash with Cefuroxime.  Keep patient NPO.  Serial CBCs with transfusion as needed to maintain Hgb >7.  [facility] will follow.   LOS: 0 days   Salley Slaughter  PA-C 09/19/2020, 8:42 AM  Contact #  [phone]

## 2020-09-19 NOTE — Progress Notes (Signed)
Patient placed in observation after midnight.  Please see H&P.  Here with GI bleed and needs urgent EGD.   GI consult appreciated. Eulogio Bear DO

## 2020-09-19 NOTE — ED Notes (Signed)
Pt to ENDO

## 2020-09-19 NOTE — H&P (View-Only) (Signed)
Referring Provider: ED Primary Care Physician:  [name], MD Primary Gastroenterologist:  Dr. [name] [facility])  Reason for Consultation:  Hematemesis  HPI: [name] is a 79 y.o. female with history of NASH cirrhosis, esophageal varices, colon cancer, advanced adnoma (s/p 2 colonic resections) presenting for consultation of hematemesis.  Patient was brought into the ED by EMS yesterday due to complaints of chest pain.  While in the ED, patient had several episodes of hematemesis.  She has also had several episodes of hematochezia as well.  Denies any abdominal pain or nausea at this time.  Patient with dementia, unable to provide history.  She is also lethargic.  Husband provides most of history.  He states patient is very lethargic at home as well.  Husband states patient has been more lethargic over the past couple of weeks.  He states she was hospitalized earlier in December for UTI and CAP, and he notes that patient had 2 liquid black stools while on antibiotics.  However, this resolved and she has been having brown stools.  He states she has a poor appetite and typically only eats things that she likes to eat, such as sweets.  He believes she may have lost some weight but he is unsure.  No blood thinner or aspirin use.  No prior history of GI bleeding, though does have a history of esophageal varices seen on EGD.  Last EGD in 2015 revealed Grade 2 esophageal varices, as well as gastric varices and portal hypertensive gastropathy   Past Medical History:  Diagnosis Date  . Anemia, iron deficiency   . Arthritis   . Bladder cancer [facility]) 2003   Bladder removed - [name]  . Cataract immature   . Cirrhosis ([place]) 08/26/2011  . Colon cancer [facility]) 2001   Hemicolectomy - [name]  . Depression   . GERD (gastroesophageal reflux disease)   . Headache(784.0)   . Hemorrhoids   . History of colon polyps   . History of DVT (deep vein thrombosis) 50+yrs ago   With pregnancy   . History of GI bleed   . History of kidney stones   . History of urostomy    Urostomy d/t hx bladder cancer  . Hx of transfusion of whole blood   . Hypertension   . Joint pain   . Memory loss    Chemotherapy; short and long term  . Osteoporosis   . Thrombocytopenia ([place])     Past Surgical History:  Procedure Laterality Date  . ABDOMINAL HYSTERECTOMY    . APPENDECTOMY    . BLADDER SURGERY     bladder removed d/t cancer  . CHOLECYSTECTOMY    . COLON RESECTION     with colostomy then colostomy reversal  . COLON SURGERY  2004  . COLON SURGERY  2001  . COLONOSCOPY    . ESOPHAGOGASTRODUODENOSCOPY  02/27/2012   Procedure: ESOPHAGOGASTRODUODENOSCOPY (EGD);  Surgeon: [name], MD;  Location: [name];  Service: Endoscopy;  Laterality: N/A;  bedside case  . NEPHROLITHOTOMY Left 05/04/2014   Procedure: LEFT PERCUTANEOUS NEPHROLITHOTOMY ;  Surgeon: [name], MD;  Location: [facility];  Service: Urology;  Laterality: Left;  . TOTAL KNEE ARTHROPLASTY  2010   Left knee    Prior to Admission medications   Medication Sig Start Date End Date Taking? Authorizing Provider  acidophilus (RISAQUAD) CAPS capsule Take 1 capsule by mouth daily.   Yes [provider]  Apoaequorin 20 MG CAPS Take 20 mg  by mouth daily.   Yes [provider]  Ascorbic Acid (VITAMIN C) 1000 MG tablet Take 1,000 mg by mouth daily.   Yes [provider]  cholecalciferol (VITAMIN D) 1000 units tablet Take 1,000 Units by mouth daily.   Yes [provider]  citalopram (CELEXA) 40 MG tablet TAKE 1 TABLET BY MOUTH EVERY DAY Patient taking differently: Take 40 mg by mouth daily. 05/17/19  Yes [name], MD  Ginkgo Biloba 60 MG TABS Take 60 mg by mouth daily.    Yes [provider]  lactulose (CHRONULAC) 10 GM/15ML solution TAKE 30 ML BY MOUTH THREE TIMES DAILY Patient taking differently: Take 20 g by mouth 3 (three) times daily. 03/16/20  Yes Ennever, Rudell Cobb, MD   losartan (COZAAR) 25 MG tablet TAKE 1 TABLET(25 MG) BY MOUTH DAILY Patient taking differently: Take 25 mg by mouth daily. 04/17/20  Yes [name], MD  magic mouthwash w/lidocaine SOLN Take 5 mLs by mouth 3 (three) times daily as needed for mouth pain. 08/15/19  Yes [place], Holli Humbles, NP  Multiple Vitamin (MULTIVITAMIN WITH MINERALS) TABS tablet Take 1 tablet by mouth daily.   Yes [provider]  pantoprazole (PROTONIX) 40 MG tablet TAKE 1 TABLET(40 MG) BY MOUTH DAILY Patient taking differently: Take 40 mg by mouth daily. 02/27/20  Yes [name], MD  spironolactone (ALDACTONE) 50 MG tablet TAKE 1 TABLET(50 MG) BY MOUTH DAILY Patient taking differently: Take 50 mg by mouth daily. 02/11/20  Yes Ennever, Rudell Cobb, MD  vitamin B-12 (CYANOCOBALAMIN) 1000 MCG tablet Take 1,000 mcg by mouth daily.   Yes [provider]  doxycycline (VIBRAMYCIN) 100 MG capsule Take 1 capsule (100 mg total) by mouth 2 (two) times daily. Patient not taking: Reported on 09/19/2020 08/29/20   Noemi Chapel, MD    Scheduled Meds: . sodium chloride   Intravenous Once  . ondansetron      . [START ON 09/22/2020] pantoprazole  40 mg Intravenous Q12H  . sodium chloride flush  3 mL Intravenous Q12H   Continuous Infusions: . sodium chloride 125 mL/hr at 09/19/20 0715  . octreotide  (SANDOSTATIN)    IV infusion 50 mcg/hr (09/19/20 0442)  . pantoprozole (PROTONIX) infusion 8 mg/hr (09/19/20 0517)   PRN Meds:.fentaNYL (SUBLIMAZE) injection, ondansetron **OR** ondansetron (ZOFRAN) IV  Allergies as of 09/18/2020 - Review Complete 09/18/2020  Allergen Reaction Noted  . Bee venom Anaphylaxis 03/14/2017  . Codeine Anaphylaxis and Rash 09/22/2011  . Morphine and related Anaphylaxis and Rash 09/22/2011  . Phytonadione Other (See Comments) 04/07/2014  . Penicillins Rash and Other (See Comments) 04/25/2013  . Promethazine hcl Other (See Comments) 09/25/2011    Family History  Problem Relation Age  of Onset  . Diabetes Brother   . Cancer Other   . Hypertension Other     Social History   Socioeconomic History  . Marital status: Married    Spouse name: Not on file  . Number of children: Not on file  . Years of education: Not on file  . Highest education level: Not on file  Occupational History  . Not on file  Tobacco Use  . Smoking status: Former Smoker    Packs/day: 0.50    Years: 35.00    Pack years: 17.50    Types: Cigarettes    Start date: 10/25/1955    Quit date: 09/22/1991    Years since quitting: 29.0  . Smokeless tobacco: Never Used  . Tobacco comment: quit 23years ago  Vaping Use  . Vaping Use: Never used  Substance and Sexual Activity  . Alcohol use: No    Alcohol/week: 0.0 standard drinks  . Drug use: No  . Sexual activity: Yes    Birth control/protection: Surgical  Other Topics Concern  . Not on file  Social History Narrative   Lives at home with her husband, ambulatory   Social Determinants of Health   Financial Resource Strain: Not on file  Food Insecurity: Not on file  Transportation Needs: Not on file  Physical Activity: Not on file  Stress: Not on file  Social Connections: Not on file  Intimate Partner Violence: Not on file    Review of Systems: Review of Systems  Constitutional: Positive for malaise/fatigue. Negative for chills and fever.  HENT: Negative for sinus pain and sore throat.   Eyes: Negative for pain and redness.  Respiratory: Negative for cough and shortness of breath.   Cardiovascular: Negative for chest pain and palpitations.  Gastrointestinal: Positive for blood in stool, nausea and vomiting. Negative for abdominal pain, constipation, diarrhea, heartburn and melena.  Genitourinary: Negative for flank pain and hematuria.  Musculoskeletal: Negative for falls and joint pain.  Skin: Negative for itching and rash.  Neurological: Negative for seizures and loss of consciousness.  Endo/Heme/Allergies: Negative for polydipsia.  Does not bruise/bleed easily.  Psychiatric/Behavioral: Positive for memory loss. Negative for substance abuse.    Physical Exam: Vital signs: Vitals:   09/19/20 0830 09/19/20 0833  BP:    Pulse: 88   Resp:  (!) 22  Temp:    SpO2: 96%      Physical Exam Vitals reviewed.  Constitutional:      General: She is not in acute distress.    Appearance: She is ill-appearing.  HENT:     Head: Normocephalic and atraumatic.     Nose: Nose normal. No congestion.     Mouth/Throat:     Mouth: Mucous membranes are moist.     Pharynx: Oropharynx is clear.  Eyes:     General: No scleral icterus.    Extraocular Movements: Extraocular movements intact.  Cardiovascular:     Rate and Rhythm: Normal rate and regular rhythm.     Pulses: Normal pulses.  Pulmonary:     Effort: Pulmonary effort is normal. No respiratory distress.     Breath sounds: Normal breath sounds.  Abdominal:     General: Bowel sounds are normal. There is no distension.     Palpations: Abdomen is soft. There is no mass.     Tenderness: There is no abdominal tenderness. There is no guarding or rebound.     Hernia: No hernia is present.     Comments: Large red bowel movement noted on chuck pad  Musculoskeletal:        General: No swelling or tenderness.     Cervical back: Normal range of motion and neck supple.  Skin:    General: Skin is warm and dry.  Neurological:     General: No focal deficit present.     Mental Status: She is oriented to person, place, and time. She is lethargic.  Psychiatric:        Mood and Affect: Mood normal.        Behavior: Behavior normal.      GI:  Lab Results: Recent Labs    09/18/20 2102 09/18/20 2201 09/19/20 0301 09/19/20 0802  WBC 3.0*  --  6.0 9.8  HGB 9.7* 10.9* 8.8* 9.7*  HCT 30.3* 32.0*  27.2* 30.1*  PLT 44*  --  56* PENDING   BMET Recent Labs    09/18/20 2102 09/18/20 2201 09/19/20 0301  NA 135 137 137  K 5.7* 5.7* 4.8  CL 104 104 107  CO2 24  --  23   GLUCOSE 138* 129* 142*  BUN 19 29* 18  CREATININE 0.96 0.90 1.04*  CALCIUM 11.2*  --  10.6*   LFT Recent Labs    09/19/20 0301  PROT 4.4*  ALBUMIN 2.2*  AST 24  ALT 15  ALKPHOS 67  BILITOT 1.4*   PT/INR Recent Labs    09/19/20 0301  LABPROT 19.8*  INR 1.8*     Studies/Results: DG Chest 2 View  Result Date: 09/19/2020 CLINICAL DATA:  79 year old female with small left pneumothorax on chest CTA 09/18/2020. EXAM: CHEST - 2 VIEW COMPARISON:  Portable chest 09/19/2020 and earlier. FINDINGS: Semi upright AP and lateral views of the chest. Stable lung volumes. Stable mediastinal contours with tortuous thoracic aorta. Visualized tracheal air column is within normal limits. No pulmonary edema. No evidence of pleural effusion. No progression of the trace left lung base pneumothorax visible by CTA but not evident on subsequent radiographs. No acute osseous abnormality identified. Paucity of bowel gas in the upper abdomen. IMPRESSION: 1. No progression of the trace left lung base pneumothorax, visible on recent CTA but not evident on subsequent radiographs. 2. No new cardiopulmonary abnormality. Electronically Signed   By: Genevie Ann M.D.   On: 09/19/2020 06:36   DG Chest Port 1 View  Result Date: 09/19/2020 CLINICAL DATA:  Chest pain EXAM: PORTABLE CHEST 1 VIEW COMPARISON:  September 18, 2020 FINDINGS: The heart size and mediastinal contours are mildly enlarged. Aortic knob calcifications are seen. Both lungs are clear. The visualized skeletal structures are unremarkable. IMPRESSION: No active disease. Electronically Signed   By: Prudencio Pair M.D.   On: 09/19/2020 02:10   DG Chest Port 1 View  Result Date: 09/18/2020 CLINICAL DATA:  Chest pain. EXAM: PORTABLE CHEST 1 VIEW COMPARISON:  08/29/2020 FINDINGS: Cardiomegaly is not significantly changed. Stable aortic atherosclerosis and tortuosity. Minimal subpleural scarring in both lung bases. No acute airspace disease. No pleural fluid or  pneumothorax. Bones are diffusely under mineralized. Chronic deformity of left proximal humerus. IMPRESSION: Stable cardiomegaly.  Bibasilar scarring.  No acute abnormality. Aortic Atherosclerosis (ICD10-I70.0). Electronically Signed   By: Keith Rake M.D.   On: 09/18/2020 21:22   CT Angio Chest/Abd/Pel for Dissection W and/or Wo Contrast  Result Date: 09/19/2020 CLINICAL DATA:  Abdominal pain, question dissection EXAM: CT ANGIOGRAPHY CHEST, ABDOMEN AND PELVIS TECHNIQUE: Non-contrast CT of the chest was initially obtained. Multidetector CT imaging through the chest, abdomen and pelvis was performed using the standard protocol during bolus administration of intravenous contrast. Multiplanar reconstructed images and MIPs were obtained and reviewed to evaluate the vascular anatomy. CONTRAST:  127m OMNIPAQUE IOHEXOL 350 MG/ML SOLN COMPARISON:  None. FINDINGS: CTA CHEST FINDINGS Cardiovascular: --Heart: The heart size is normal.  There is nopericardial effusion. --Aorta: The course and caliber of the thoracic aorta are normal. There is scattered aortic atherosclerotic calcification. Precontrast images show no aortic intramural hematoma. There is no blood pool, dissection or penetrating ulcer demonstrated on arterial phase postcontrast imaging. There is a conventional 3 vessel aortic arch branching pattern. The proximal arch vessels are widely patent. --Pulmonary Arteries: Contrast timing is optimized for preferential opacification of the aorta. Within that limitation, normal central pulmonary arteries. Mediastinum/Nodes: No mediastinal, hilar or axillary lymphadenopathy. The  visualized thyroid and thoracic esophageal course are unremarkable. Lungs/Pleura: There is a small left basilar pneumothorax present. No mediastinal shift. No pleural effusion. Musculoskeletal: No chest wall abnormality. No acute osseous findings. Review of the MIP images confirms the above findings. CTA ABDOMEN AND PELVIS FINDINGS  VASCULAR Aorta: Normal caliber aorta without aneurysm, dissection, vasculitis or hemodynamically significant stenosis. There is scattered aortic atherosclerosis. Celiac: No aneurysm, dissection or hemodynamically significant stenosis. Normal branching pattern SMA: There is mild stenosis at the origin of the SMA due to atherosclerosis. Renals: Single renal arteries bilaterally. No aneurysm, dissection, stenosis or evidence of fibromuscular dysplasia. IMA: Patent without abnormality. Inflow: No aneurysm, stenosis or dissection. Veins: Normal course and caliber of the major veins. Assessment is otherwise limited by the arterial dominant contrast phase. Review of the MIP images confirms the above findings. NON-VASCULAR Hepatobiliary: There is a slightly shrunken nodular liver contour seen throughout. The patient is status post cholecystectomy. Again noted is a tiny 5 mm hypodense lesion in the posterior right liver lobe. 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 Pancreas: Normal contours without ductal dilatation. No peripancreatic fluid collection. Spleen: Normal arterial phase splenic enhancement pattern. There is mild splenomegaly measuring 14 cm in craniocaudad dimension. Adrenals/Urinary Tract: --Adrenal glands: Normal. Kidneys: A small amount of air seen within the bilateral renal pelvis which could be due to recent catheter. No hydronephrosis or collecting system calculi. 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 Stomach/Bowel: --Stomach/Duodenum: A small hiatal hernia is present. There appears to be question of fat stranding changes seen at the gastroduodenal junction. --Small bowel: No dilatation or inflammation. --Colon: Scattered colonic diverticula are noted. The patient is status post cholecystectomy. A large amount  of rectal colonic stool is present. Lymphatic: No abdominal or pelvic lymphadenopathy. A small amount abdominopelvic ascites is present. Reproductive: No free fluid in the pelvis. Musculoskeletal. No bony spinal canal stenosis or focal osseous abnormality. There is diffuse osteopenia. Other: None. Review of the MIP images confirms the above findings. IMPRESSION: 1. No acute aortic abnormality. 2. Small left basilar pneumothorax. 3. Small amount of abdominopelvic ascites. 4. Findings which may be suggestive of gastritis at the gastroduodenal junction. 5. Findings of cirrhosis and portal hypertension 6. Chronic portal vein/splenic confluence thrombosis. 7.  Aortic Atherosclerosis (ICD10-I70.0). 8. These results were called by telephone at the time of interpretation on 09/19/2020 at 12:06 am to provider DAN FLOYD , who verbally acknowledged these results. Electronically Signed   By: Prudencio Pair M.D.   On: 09/19/2020 00:05    Impression: Upper GI bleeding: Concern for esophageal variceal bleeding.  Bleeding appears to be brisk, as exhibited by bright red blood per rectum in addition to hematemesis.  Surprisingly, BUN is normal (18) today and was only mildly elevated to 29 yesterday.   -Hemoglobin 9.7 this morning, improved from 8.8 earlier this morning.  Baseline hemoglobin 11-12 -INR 1.8 -CT 12/28: Findings which may be suggestive of gastritis at the gastroduodenal junction. -History of esophageal varices, noted on 2015 EGD -Patient currently stable with SBP 105-110, DBP 70-80, HR 90s  NASH Cirrhosis, MELD score of 15 as of 09/19/20  Dementia  Plan: Recommend urgent EGD today.  I thoroughly discussed this with patient's husband at bedside, to include nature, alternatives, benefits, and risks (including but not limited to bleeding, infection, perforation, anesthesia/cardiac and pulmonary complications).  Patient's  husband verbalized understanding and gave verbal consent to proceed with EGD.  EGD  planned for 11:30, which is the earliest available time in the endoscopy unit.  Continue Protonix and octreotide drips.  Initiate Cipro 400 mg IV BID.  This was chosen as an alternative to Ceftriaxone, as patient has history of rash with Cefuroxime.  Keep patient NPO.  Serial CBCs with transfusion as needed to maintain Hgb >7.  [facility] will follow.   LOS: 0 days   Salley Slaughter  PA-C 09/19/2020, 8:42 AM  Contact #  [phone]

## 2020-09-19 NOTE — ED Notes (Signed)
Paged Dr. Myna Hidalgo for RN

## 2020-09-19 NOTE — H&P (Addendum)
History and Physical    ELLEN GORIS GYK:[id number] DOB: [birth year] DOA: 09/18/2020  PCP: [name], MD   Patient coming from: Home   Chief Complaint: Chest pain, back pain, N/V   HPI: [name] is a 79 y.o. female with medical history significant for dementia, remote stage II colon cancer, bladder cancer, cirrhosis, chronic pancytopenia, now presenting to the emergency department for evaluation of chest pain, nausea, and vomiting.  She is accompanied by her son who assists with the history.  Patient reportedly been in her usual state of health when she was eating last night and developed acute onset of pain in her left chest and back, appeared very uncomfortable, and was noted to have some diaphoresis, nausea, and vomiting.  EMS was called, administered 324 mg of aspirin, and brought the patient into the emergency department.  ED Course: Upon arrival to the ED, patient is found to be afebrile, saturating mid 90s on room air, and with stable blood pressure.  EKG features sinus rhythm with RBBB.  Chest x-ray demonstrates stable cardiomegaly.  High-sensitivity troponin is normal x2.  CTA chest is negative for acute aortic abnormality but notable for small left basilar pneumothorax and question of fat stranding about the gastroduodenal junction.  Chemistry panel features a potassium of 5.7 and bilirubin 2.0.  CBC with chronic pancytopenia.  CT surgery was consulted by the ED physician and indicated that the patient could be discharged if repeat imaging in the morning was stable.  Patient developed hematemesis in the ED, was given additional IV fluids, and hospitalists consulted for admission.  Review of Systems:  Unable to complete ROS secondary to the patient's clinical condition.  Past Medical History:  Diagnosis Date  . Anemia, iron deficiency   . Arthritis   . Bladder cancer [facility]) 2003   Bladder removed - [name]  . Cataract immature   . Cirrhosis ([place]) 08/26/2011  .  Colon cancer [facility]) 2001   Hemicolectomy - [name]  . Depression   . GERD (gastroesophageal reflux disease)   . Headache(784.0)   . Hemorrhoids   . History of colon polyps   . History of DVT (deep vein thrombosis) 50+yrs ago   With pregnancy  . History of GI bleed   . History of kidney stones   . History of urostomy    Urostomy d/t hx bladder cancer  . Hx of transfusion of whole blood   . Hypertension   . Joint pain   . Memory loss    Chemotherapy; short and long term  . Osteoporosis   . Thrombocytopenia ([place])     Past Surgical History:  Procedure Laterality Date  . ABDOMINAL HYSTERECTOMY    . APPENDECTOMY    . BLADDER SURGERY     bladder removed d/t cancer  . CHOLECYSTECTOMY    . COLON RESECTION     with colostomy then colostomy reversal  . COLON SURGERY  2004  . COLON SURGERY  2001  . COLONOSCOPY    . ESOPHAGOGASTRODUODENOSCOPY  02/27/2012   Procedure: ESOPHAGOGASTRODUODENOSCOPY (EGD);  Surgeon: [name], MD;  Location: [name];  Service: Endoscopy;  Laterality: N/A;  bedside case  . NEPHROLITHOTOMY Left 05/04/2014   Procedure: LEFT PERCUTANEOUS NEPHROLITHOTOMY ;  Surgeon: [name], MD;  Location: [facility];  Service: Urology;  Laterality: Left;  . TOTAL KNEE ARTHROPLASTY  2010   Left knee    Social History:   reports that she quit smoking about 29 years ago. Her  smoking use included cigarettes. She started smoking about 64 years ago. She has a 17.50 pack-year smoking history. She has never used smokeless tobacco. She reports that she does not drink alcohol and does not use drugs.  Allergies  Allergen Reactions  . Bee Venom Anaphylaxis  . Codeine Anaphylaxis, Rash and Shortness Of Breath  . Morphine And Related Anaphylaxis and Rash  . Nutritional Supplements Anaphylaxis  . Phytonadione Other (See Comments)    Pt experienced an episode of cyanosis following dose of Vitamin K 10 mg IV.  Marland Kitchen Ceftin [Cefuroxime] Rash  . Penicillins Rash and Other (See  Comments)    Has patient had a PCN reaction causing immediate rash, facial/tongue/throat swelling, SOB or lightheadedness with hypotension: No Has patient had a PCN reaction causing severe rash involving mucus membranes or skin necrosis: No Has patient had a PCN reaction that required hospitalization: No Has patient had a PCN reaction occurring within the last 10 years: No If all of the above answers are "NO", then may proceed with Cephalosporin use.  . Promethazine Hcl Other (See Comments)    Reaction:  Unknown     Family History  Problem Relation Age of Onset  . Diabetes Brother   . Cancer Other   . Hypertension Other      Prior to Admission medications   Medication Sig Start Date End Date Taking? Authorizing Provider  Apoaequorin 20 MG CAPS Take 20 mg by mouth daily.   Yes [provider]  Ascorbic Acid (VITAMIN C) 1000 MG tablet Take 1,000 mg by mouth daily.   Yes [provider]  cholecalciferol (VITAMIN D) 1000 units tablet Take 1,000 Units by mouth daily.   Yes [provider]  Cinnamon 500 MG capsule Take 500 mg by mouth daily.   Yes [provider]  Ginkgo Biloba 60 MG TABS Take 60 mg by mouth daily.    Yes [provider]  Multiple Vitamin (MULTIVITAMIN WITH MINERALS) TABS tablet Take 1 tablet by mouth daily.   Yes [provider]  pantoprazole (PROTONIX) 40 MG tablet TAKE 1 TABLET(40 MG) BY MOUTH DAILY Patient taking differently: Take 40 mg by mouth daily. 02/27/20  Yes [name], MD  vitamin B-12 (CYANOCOBALAMIN) 1000 MCG tablet Take 1,000 mcg by mouth daily.   Yes [provider]  vitamin E 400 UNIT capsule Take 400 Units by mouth daily.   Yes [provider]  acidophilus (RISAQUAD) CAPS capsule Take 1 capsule by mouth daily.    [provider]  citalopram (CELEXA) 40 MG tablet TAKE 1 TABLET BY MOUTH EVERY DAY Patient taking differently: Take 40 mg by mouth daily. 05/17/19   [name], MD  clotrimazole-betamethasone (LOTRISONE) cream Apply 1 application topically 2 (two) times daily. 11/03/19   [name], MD  doxycycline (VIBRAMYCIN) 100 MG capsule Take 1 capsule (100 mg total) by mouth 2 (two) times daily. 08/29/20   Noemi Chapel, MD  lactulose (CHRONULAC) 10 GM/15ML solution TAKE 30 ML BY MOUTH THREE TIMES DAILY Patient taking differently: Take 20 g by mouth 3 (three) times daily. 03/16/20   [name], MD  lidocaine (XYLOCAINE) 5 % ointment Apply topically 3 (three) times daily as needed (For hemorrhoids). 10/17/19   Alma Friendly, MD  losartan (COZAAR) 25 MG tablet TAKE 1 TABLET(25 MG) BY MOUTH DAILY Patient taking differently: Take 25 mg by mouth daily. 04/17/20   [name], MD  magic mouthwash w/lidocaine SOLN Take 5 mLs by  mouth 3 (three) times daily as needed for mouth pain. 08/15/19   [place], Holli Humbles, NP  omega-3 acid ethyl esters (LOVAZA) 1 g capsule Take 1 g by mouth daily.    [provider]  spironolactone (ALDACTONE) 50 MG tablet TAKE 1 TABLET(50 MG) BY MOUTH DAILY Patient taking differently: Take 50 mg by mouth daily. 02/11/20   [name], MD    Physical Exam: Vitals:   09/18/20 2230 09/19/20 0030 09/19/20 0124 09/19/20 0125  BP: 111/90 (!) 108/48 93/71   Pulse: (!) 59 64 81 81  Resp: 12 (!) 21 (!) 25 19  Temp:      TempSrc:      SpO2: 99% 99% 96% 100%  Weight:      Height:        Constitutional: NAD, calm  Eyes: PERTLA, lids and conjunctivae normal ENMT: Mucous membranes are moist. Posterior pharynx clear of any exudate or lesions.   Neck: normal, supple, no masses, no thyromegaly Respiratory: no wheezing, no crackles. No accessory muscle use.  Cardiovascular: S1 & S2 heard, regular rate and rhythm. No extremity edema.   Abdomen: No distension, no tenderness, soft. Bowel sounds active.  Musculoskeletal: no clubbing / cyanosis. No joint deformity upper and lower extremities.   Skin: no  significant rashes, lesions, ulcers. Warm, dry, well-perfused. Neurologic: CN 2-12 grossly intact. Sensation intact. Moving all extremities.  Psychiatric: Alert and oriented to person only. Pleasant and cooperative.    Labs and Imaging on Admission: I have personally reviewed following labs and imaging studies  CBC: Recent Labs  Lab 09/18/20 2102 09/18/20 2201  WBC 3.0*  --   NEUTROABS 2.3  --   HGB 9.7* 10.9*  HCT 30.3* 32.0*  MCV 100.0  --   PLT 44*  --    Basic Metabolic Panel: Recent Labs  Lab 09/18/20 2102 09/18/20 2201  NA 135 137  K 5.7* 5.7*  CL 104 104  CO2 24  --   GLUCOSE 138* 129*  BUN 19 29*  CREATININE 0.96 0.90  CALCIUM 11.2*  --    GFR: Estimated Creatinine Clearance: 51 mL/min (by C-G formula based on SCr of 0.9 mg/dL). Liver Function Tests: Recent Labs  Lab 09/18/20 2102  AST 61*  ALT 18  ALKPHOS 80  BILITOT 2.0*  PROT 4.9*  ALBUMIN 2.6*   Recent Labs  Lab 09/18/20 2102  LIPASE 32   No results for input(s): AMMONIA in the last 168 hours. Coagulation Profile: No results for input(s): INR, PROTIME in the last 168 hours. Cardiac Enzymes: No results for input(s): CKTOTAL, CKMB, CKMBINDEX, TROPONINI in the last 168 hours. BNP (last 3 results) No results for input(s): PROBNP in the last 8760 hours. HbA1C: No results for input(s): HGBA1C in the last 72 hours. CBG: No results for input(s): GLUCAP in the last 168 hours. Lipid Profile: No results for input(s): CHOL, HDL, LDLCALC, TRIG, CHOLHDL, LDLDIRECT in the last 72 hours. Thyroid Function Tests: No results for input(s): TSH, T4TOTAL, FREET4, T3FREE, THYROIDAB in the last 72 hours. Anemia Panel: No results for input(s): VITAMINB12, FOLATE, FERRITIN, TIBC, IRON, RETICCTPCT in the last 72 hours. Urine analysis:    Component Value Date/Time   COLORURINE AMBER (A) 08/29/2020 1002   APPEARANCEUR TURBID (A) 08/29/2020 1002   LABSPEC 1.011 08/29/2020 1002   PHURINE 8.0 08/29/2020 1002    GLUCOSEU NEGATIVE 08/29/2020 1002   HGBUR NEGATIVE 08/29/2020 1002   [place] 08/29/2020 1002   [place] 08/29/2020 1002  PROTEINUR 30 (A) 08/29/2020 1002   UROBILINOGEN 1.0 04/06/2014 1856   NITRITE NEGATIVE 08/29/2020 1002   LEUKOCYTESUR LARGE (A) 08/29/2020 1002   Sepsis Labs: @LABRCNTIP (procalcitonin:4,lacticidven:4) )No results found for this or any previous visit (from the past 240 hour(s)).   Radiological Exams on Admission: DG Chest Port 1 View  Result Date: 09/18/2020 CLINICAL DATA:  Chest pain. EXAM: PORTABLE CHEST 1 VIEW COMPARISON:  08/29/2020 FINDINGS: Cardiomegaly is not significantly changed. Stable aortic atherosclerosis and tortuosity. Minimal subpleural scarring in both lung bases. No acute airspace disease. No pleural fluid or pneumothorax. Bones are diffusely under mineralized. Chronic deformity of left proximal humerus. IMPRESSION: Stable cardiomegaly.  Bibasilar scarring.  No acute abnormality. Aortic Atherosclerosis (ICD10-I70.0). Electronically Signed   By: Keith Rake M.D.   On: 09/18/2020 21:22   CT Angio Chest/Abd/Pel for Dissection W and/or Wo Contrast  Result Date: 09/19/2020 CLINICAL DATA:  Abdominal pain, question dissection EXAM: CT ANGIOGRAPHY CHEST, ABDOMEN AND PELVIS TECHNIQUE: Non-contrast CT of the chest was initially obtained. Multidetector CT imaging through the chest, abdomen and pelvis was performed using the standard protocol during bolus administration of intravenous contrast. Multiplanar reconstructed images and MIPs were obtained and reviewed to evaluate the vascular anatomy. CONTRAST:  159m OMNIPAQUE IOHEXOL 350 MG/ML SOLN COMPARISON:  None. FINDINGS: CTA CHEST FINDINGS Cardiovascular: --Heart: The heart size is normal.  There is nopericardial effusion. --Aorta: The course and caliber of the thoracic aorta are normal. There is scattered aortic atherosclerotic calcification. Precontrast images show no aortic intramural  hematoma. There is no blood pool, dissection or penetrating ulcer demonstrated on arterial phase postcontrast imaging. There is a conventional 3 vessel aortic arch branching pattern. The proximal arch vessels are widely patent. --Pulmonary Arteries: Contrast timing is optimized for preferential opacification of the aorta. Within that limitation, normal central pulmonary arteries. Mediastinum/Nodes: No mediastinal, hilar or axillary lymphadenopathy. The visualized thyroid and thoracic esophageal course are unremarkable. Lungs/Pleura: There is a small left basilar pneumothorax present. No mediastinal shift. No pleural effusion. Musculoskeletal: No chest wall abnormality. No acute osseous findings. Review of the MIP images confirms the above findings. CTA ABDOMEN AND PELVIS FINDINGS VASCULAR Aorta: Normal caliber aorta without aneurysm, dissection, vasculitis or hemodynamically significant stenosis. There is scattered aortic atherosclerosis. Celiac: No aneurysm, dissection or hemodynamically significant stenosis. Normal branching pattern SMA: There is mild stenosis at the origin of the SMA due to atherosclerosis. Renals: Single renal arteries bilaterally. No aneurysm, dissection, stenosis or evidence of fibromuscular dysplasia. IMA: Patent without abnormality. Inflow: No aneurysm, stenosis or dissection. Veins: Normal course and caliber of the major veins. Assessment is otherwise limited by the arterial dominant contrast phase. Review of the MIP images confirms the above findings. NON-VASCULAR Hepatobiliary: There is a slightly shrunken nodular liver contour seen throughout. The patient is status post cholecystectomy. Again noted is a tiny 5 mm hypodense lesion in the posterior right liver lobe. 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 Pancreas: Normal contours without ductal dilatation. No peripancreatic fluid collection. Spleen: Normal arterial  phase splenic enhancement pattern. There is mild splenomegaly measuring 14 cm in craniocaudad dimension. Adrenals/Urinary Tract: --Adrenal glands: Normal. Kidneys: A small amount of air seen within the bilateral renal pelvis which could be due to recent catheter. No hydronephrosis or collecting system calculi. 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 Stomach/Bowel: --Stomach/Duodenum: A small hiatal hernia  is present. There appears to be question of fat stranding changes seen at the gastroduodenal junction. --Small bowel: No dilatation or inflammation. --Colon: Scattered colonic diverticula are noted. The patient is status post cholecystectomy. A large amount of rectal colonic stool is present. Lymphatic: No abdominal or pelvic lymphadenopathy. A small amount abdominopelvic ascites is present. Reproductive: No free fluid in the pelvis. Musculoskeletal. No bony spinal canal stenosis or focal osseous abnormality. There is diffuse osteopenia. Other: None. Review of the MIP images confirms the above findings. IMPRESSION: 1. No acute aortic abnormality. 2. Small left basilar pneumothorax. 3. Small amount of abdominopelvic ascites. 4. Findings which may be suggestive of gastritis at the gastroduodenal junction. 5. Findings of cirrhosis and portal hypertension 6. Chronic portal vein/splenic confluence thrombosis. 7.  Aortic Atherosclerosis (ICD10-I70.0). 8. These results were called by telephone at the time of interpretation on 09/19/2020 at 12:06 am to provider DAN FLOYD , who verbally acknowledged these results. Electronically Signed   By: Prudencio Pair M.D.   On: 09/19/2020 00:05    EKG: Independently reviewed.  Sinus rhythm, RBBB.   Assessment/Plan   1. Hematemesis  - Patient with cirrhosis who had esophageal varices and gastric ulcer on EGD in 2013 presents with acute-chest pain and N/V,  found to have pneumothorax on left, was treated with ASA 324 mg en route, and developed hematemesis in ED  - She is hemodynamically stable  - Type and screen, start IV PPI and octreotide, transfuse platelets to keep >50k while bleeding, follow serial CBCs, consult GI    ADDENDUM: Platelets 56k on repeat CBC prior to platelet transfusion - will hold off on transfusion for now.    2. Pneumothorax  - Presents with acute-onset chest pain, gripping her left chest, and is found to have small left pneumothorax  - CT surgery was consulted by ED physician and indicated that if repeat imaging in am is stable she could be discharged  - Repeat imaging in am   3. Pancytopenia  - Attributed to her chronic liver disease, all CBC indices similar to priors  - Following serial CBCs   4. Cirrhosis  - Appears compensated  - Hold diuretics while NPO and vomiting    5. Dementia  - She is calm on admission, does not recognize her son who reports this to be her baseline  - Continue supportive care, delirium precautions    6. Hyperkalemia  - Serum potassium is 5.7 in ED  - Continue cardiac monitoring, continue IVF, repeat chem panel    DVT prophylaxis: SCDs Code Status: DNR, confirmed with family on admission  Family Communication: Son updated at bedside  Disposition Plan:  Patient is from: Home  Anticipated d/c is to: TBD Anticipated d/c date is: 12/30 or 09/21/20 Patient currently: Pending GI consultation, repeat chest imaging  Consults called: CT surgery consulted by ED physician. Message sent to [facility] for am consult request.  Admission status: Observation     Vianne Bulls, MD [facility]  09/19/2020, 2:09 AM

## 2020-09-20 ENCOUNTER — Encounter (HOSPITAL_COMMUNITY): Payer: Self-pay | Admitting: Gastroenterology

## 2020-09-20 DIAGNOSIS — K7460 Unspecified cirrhosis of liver: Secondary | ICD-10-CM | POA: Diagnosis not present

## 2020-09-20 DIAGNOSIS — Z7189 Other specified counseling: Secondary | ICD-10-CM | POA: Diagnosis not present

## 2020-09-20 DIAGNOSIS — F0390 Unspecified dementia without behavioral disturbance: Secondary | ICD-10-CM | POA: Diagnosis not present

## 2020-09-20 DIAGNOSIS — Z66 Do not resuscitate: Secondary | ICD-10-CM

## 2020-09-20 DIAGNOSIS — K920 Hematemesis: Secondary | ICD-10-CM | POA: Diagnosis not present

## 2020-09-20 DIAGNOSIS — Z515 Encounter for palliative care: Secondary | ICD-10-CM | POA: Diagnosis not present

## 2020-09-20 LAB — COMPREHENSIVE METABOLIC PANEL
ALT: 18 U/L (ref 0–44)
AST: 30 U/L (ref 15–41)
Albumin: 2.3 g/dL — ABNORMAL LOW (ref 3.5–5.0)
Alkaline Phosphatase: 66 U/L (ref 38–126)
Anion gap: 5 (ref 5–15)
BUN: 30 mg/dL — ABNORMAL HIGH (ref 8–23)
CO2: 23 mmol/L (ref 22–32)
Calcium: 10.1 mg/dL (ref 8.9–10.3)
Chloride: 110 mmol/L (ref 98–111)
Creatinine, Ser: 1.19 mg/dL — ABNORMAL HIGH (ref 0.44–1.00)
GFR, Estimated: 46 mL/min — ABNORMAL LOW (ref >60)
Glucose, Bld: 135 mg/dL — ABNORMAL HIGH (ref 70–99)
Potassium: 5.5 mmol/L — ABNORMAL HIGH (ref 3.5–5.1)
Sodium: 138 mmol/L (ref 135–145)
Total Bilirubin: 1.4 mg/dL — ABNORMAL HIGH (ref 0.3–1.2)
Total Protein: 4.5 g/dL — ABNORMAL LOW (ref 6.5–8.1)

## 2020-09-20 LAB — AMMONIA: Ammonia: 103 umol/L — ABNORMAL HIGH (ref 9–35)

## 2020-09-20 LAB — CBC
HCT: 22.3 % — ABNORMAL LOW (ref 36.0–46.0)
Hemoglobin: 7.6 g/dL — ABNORMAL LOW (ref 12.0–15.0)
MCH: 33.8 pg (ref 26.0–34.0)
MCHC: 34.1 g/dL (ref 30.0–36.0)
MCV: 99.1 fL (ref 80.0–100.0)
Platelets: 41 10*3/uL — ABNORMAL LOW (ref 150–400)
RBC: 2.25 MIL/uL — ABNORMAL LOW (ref 3.87–5.11)
RDW: 17.2 % — ABNORMAL HIGH (ref 11.5–15.5)
WBC: 7.3 10*3/uL (ref 4.0–10.5)
nRBC: 0 % (ref 0.0–0.2)

## 2020-09-20 MED ORDER — RIFAXIMIN 550 MG PO TABS
550.0000 mg | ORAL_TABLET | Freq: Two times a day (BID) | ORAL | Status: DC
  Administered 2020-09-22 – 2020-09-25 (×7): 550 mg via ORAL
  Filled 2020-09-20 (×12): qty 1

## 2020-09-20 MED ORDER — LACTULOSE 10 GM/15ML PO SOLN
10.0000 g | Freq: Three times a day (TID) | ORAL | Status: DC
  Administered 2020-09-20: 10 g via ORAL
  Filled 2020-09-20: qty 15

## 2020-09-20 MED ORDER — LACTULOSE 10 GM/15ML PO SOLN: 10.0000 g | Freq: Two times a day (BID) | ORAL | Status: DC

## 2020-09-20 NOTE — Plan of Care (Signed)
  Problem: Coping: Goal: Level of anxiety will decrease Outcome: Progressing   Problem: Pain Managment: Goal: General experience of comfort will improve Outcome: Progressing   

## 2020-09-20 NOTE — Plan of Care (Signed)
  Problem: Clinical Measurements: Goal: Ability to maintain clinical measurements within normal limits will improve Outcome: Progressing Goal: Will remain free from infection Outcome: Progressing Goal: Diagnostic test results will improve Outcome: Progressing Goal: Respiratory complications will improve Outcome: Progressing Goal: Cardiovascular complication will be avoided Outcome: Progressing   Problem: Activity: Goal: Risk for activity intolerance will decrease Outcome: Progressing   Problem: Coping: Goal: Level of anxiety will decrease Outcome: Progressing   Problem: Pain Managment: Goal: General experience of comfort will improve Outcome: Progressing

## 2020-09-20 NOTE — Progress Notes (Signed)
Hemoglobin has dropped from 9.7 to 7.6 over the past 24 hours.  Unclear if this reflects ongoing bleeding or equilibration.  However, the patient's daughter, Dwain Sarna, is at the bedside and indicates she was with her mother all night, and her mother did not have any bowel movements.  Renal function is slightly worse, with both BUN and creatinine is slightly elevated compared to admission.  I suspect this may reflect some mild acute kidney injury.  On exam, the patient has stable vital signs and is awake, but noncommunicative.  She is slightly fidgety but in no distress.  Chest is clear anteriorly, heart normal, abdomen adipose, unable to assess for ascites clinically.  I had a very lengthy discussion with the patient's daughter concerning the patient's prognosis.  Although it is hard to know for sure, I get the impression that taking into account the patient's overall decline in recent months, with dementia, decreased mobility, and now this acute bleeding episode, that she is probably nearing the end of her life.  I recommended a palliative care consult to help develop goals of care, and the patient's daughter was very receptive to this.  I have placed an order for that consult, and would be happy to be called if they would like to discuss the patient's case since I have followed her as an outpatient for a number of years.  In the meantime, we will continue current medical therapy with octreotide, IV Protonix, lab monitoring, etc.  Cleotis Nipper, M.D. Pager [phone] If no answer or after 5 PM call [phone]

## 2020-09-20 NOTE — Consult Note (Signed)
Consultation Note Date: 09/20/2020   Patient Name: [name]  DOB: March 10, 1941  MRN: [id number]  Age / Sex: 79 y.o., female  PCP: [name], MD Referring Physician: Geradine Girt, DO  Reason for Consultation: Establishing goals of care  HPI/Patient Profile: 79 y.o. female  with past medical history of dementia, colon cancer, bladder cancer, NASH and cirrhosis, chronic pancytopenia, HTN, GERD, and GI bleeds admitted on 09/18/2020 with chest pain, nausea, and vomiting. Found to have left pneumothorax that resolved on repeat x ray. Developed hematemesis in ED. Had EGD with 7 bands placed. PMT consulted by Dr. [name] for goals of care discussion.  Clinical Assessment and Goals of Care: I have reviewed medical records including EPIC notes, labs and imaging, assessed the patient and then met with her spouse Mariea Clonts to discuss diagnosis prognosis, [place], EOL wishes, disposition and options.  I introduced Palliative Medicine as specialized medical care for people living with serious illness. It focuses on providing relief from the symptoms and stress of a serious illness. The goal is to improve quality of life for both the patient and the family.  We discussed a brief life review of the patient. Mariea Clonts tells me he and patient have been married 84 years. They have 4 children - all live in [place]. Patient and spouse have lived most of their lives in the [place] area.   As far as functional and nutritional status, spouse tells me patient could ambulate some at home with walker. She was dependent on others for daily care needs. He tells me her appetite has declined over the past 2-3 months. This is very concerning for him. He tells me she has been nonverbal for 2 years. Mariea Clonts provides the majority of her care - he does have some assistance from private caregivers a few times a week.    We briefly discussed patient's current illness. Briefly  discussed progressive nature of dementia and how is affects her function, nutrition, and cognition.   What is most important to Mariea Clonts is that the patient not be admitted to a nursing home. He would like to work out a safe plan to get her home.   We did not further discuss goals of care and options as Mariea Clonts would like their daughter [name] to be present.   I spoke with [name] briefly via telephone - she is open to goals of care discussion. We scheduled this for tomorrow to include her and Mariea Clonts. [name] does mention that they have discussed hospice before but were not yet ready for it and she asks that we discuss all options available.   Questions and concerns were addressed. The family was encouraged to call with questions or concerns.   Primary Decision Maker NEXT OF KIN - spouse Kaveri Perras  SUMMARY OF RECOMMENDATIONS   - further goals of care discussion scheduled with daughter and spouse for 12/31  Code Status/Advance Care Planning:  DNR  Prognosis:   Unable to determine - would likely be hospice eligible  Discharge Planning: To Be Determined      Primary Diagnoses: Present on Admission: . Hematemesis . Thrombocytopenia ([place]) . Hyperkalemia . Pancytopenia ([place]) . Dementia without behavioral disturbance ([place]) . Cirrhosis, non-alcoholic ([place]) . GI bleed   I have reviewed the medical record, interviewed the patient and family, and examined the patient. The following aspects are pertinent.  Past Medical History:  Diagnosis Date  . Anemia, iron deficiency   . Arthritis   . Bladder cancer [facility]) 2003  Bladder removed - [name]  . Cataract immature   . Cirrhosis ([place]) 08/26/2011  . Colon cancer [facility]) 2001   Hemicolectomy - [name]  . Depression   . GERD (gastroesophageal reflux disease)   . Headache(784.0)   . Hemorrhoids   . History of colon polyps   . History of DVT (deep vein thrombosis) 50+yrs ago   With pregnancy  . History of GI bleed   . History  of kidney stones   . History of urostomy    Urostomy d/t hx bladder cancer  . Hx of transfusion of whole blood   . Hypertension   . Joint pain   . Memory loss    Chemotherapy; short and long term  . Osteoporosis   . Thrombocytopenia ([place])    Social History   Socioeconomic History  . Marital status: Married    Spouse name: Not on file  . Number of children: Not on file  . Years of education: Not on file  . Highest education level: Not on file  Occupational History  . Not on file  Tobacco Use  . Smoking status: Former Smoker    Packs/day: 0.50    Years: 35.00    Pack years: 17.50    Types: Cigarettes    Start date: 10/25/1955    Quit date: 09/22/1991    Years since quitting: 29.0  . Smokeless tobacco: Never Used  . Tobacco comment: quit 23years ago  Vaping Use  . Vaping Use: Never used  Substance and Sexual Activity  . Alcohol use: No    Alcohol/week: 0.0 standard drinks  . Drug use: No  . Sexual activity: Yes    Birth control/protection: Surgical  Other Topics Concern  . Not on file  Social History Narrative   Lives at home with her husband, ambulatory   Social Determinants of Health   Financial Resource Strain: Not on file  Food Insecurity: Not on file  Transportation Needs: Not on file  Physical Activity: Not on file  Stress: Not on file  Social Connections: Not on file   Family History  Problem Relation Age of Onset  . Diabetes Brother   . Cancer Other   . Hypertension Other    Scheduled Meds: . sodium chloride   Intravenous Once  . lactulose  10 g Oral TID  . [START ON 09/22/2020] pantoprazole  40 mg Intravenous Q12H  . rifaximin  550 mg Oral BID  . sodium chloride flush  3 mL Intravenous Q12H   Continuous Infusions: . ciprofloxacin 400 mg (09/20/20 0457)  . lactated ringers 50 mL/hr at 09/20/20 1453  . octreotide  (SANDOSTATIN)    IV infusion 50 mcg/hr (09/20/20 0448)  . pantoprozole (PROTONIX) infusion 8 mg/hr (09/20/20 1453)   PRN  Meds:.fentaNYL (SUBLIMAZE) injection, ondansetron **OR** ondansetron (ZOFRAN) IV Allergies  Allergen Reactions  . Bee Venom Anaphylaxis  . Codeine Anaphylaxis, Rash and Shortness Of Breath  . Morphine And Related Anaphylaxis and Rash  . Nutritional Supplements Anaphylaxis  . Phytonadione Other (See Comments)    Pt experienced an episode of cyanosis following dose of Vitamin K 10 mg IV.  Marland Kitchen Ceftin [Cefuroxime] Rash  . Penicillins Rash and Other (See Comments)    Has patient had a PCN reaction causing immediate rash, facial/tongue/throat swelling, SOB or lightheadedness with hypotension: No Has patient had a PCN reaction causing severe rash involving mucus membranes or skin necrosis: No Has patient had a PCN reaction that required hospitalization: No Has patient had a  PCN reaction occurring within the last 10 years: No If all of the above answers are "NO", then may proceed with Cephalosporin use.  . Promethazine Hcl Other (See Comments)    Reaction:  Unknown    Review of Systems  Unable to perform ROS: Dementia    Physical Exam Constitutional:      General: She is not in acute distress.    Comments: Lethargic, nonverbal  Pulmonary:     Effort: Pulmonary effort is normal.  Skin:    General: Skin is warm and dry.     Vital Signs: BP (!) 133/54 (BP Location: Right Arm)   Pulse 84   Temp 98.2 F (36.8 C) (Axillary)   Resp 18   Ht 5' 3"  (1.6 m)   Wt 85.7 kg   SpO2 94%   BMI 33.47 kg/m  Pain Scale: Faces   Pain Score: 0-No pain   SpO2: SpO2: 94 % O2 Device:SpO2: 94 % O2 Flow Rate: .O2 Flow Rate (L/min): 2 L/min  IO: Intake/output summary:   Intake/Output Summary (Last 24 hours) at 09/20/2020 1606 Last data filed at 09/20/2020 1300 Gross per 24 hour  Intake 1285.81 ml  Output 1050 ml  Net 235.81 ml    LBM: Last BM Date: 09/17/20 Baseline Weight: Weight: 86.2 kg Most recent weight: Weight: 85.7 kg     Palliative Assessment/Data: PPS 20%    Time Total: 45  minutes Greater than 50%  of this time was spent counseling and coordinating care related to the above assessment and plan.  Juel Burrow, DNP, AGNP-C Palliative Medicine Team [phone] Pager: [phone]

## 2020-09-20 NOTE — Plan of Care (Signed)
  Problem: Clinical Measurements: Goal: Respiratory complications will improve Outcome: Progressing Goal: Cardiovascular complication will be avoided Outcome: Progressing   Problem: Nutrition: Goal: Adequate nutrition will be maintained Outcome: Progressing

## 2020-09-20 NOTE — Progress Notes (Signed)
Progress Note    CATHERENE KALETA  JSE:[id number] DOB: December 19, 1940  DOA: 09/18/2020 PCP: [name], MD    Brief Narrative:     Medical records reviewed and are as summarized below:  OTIE HEADLEE is an 79 y.o. female with medical history significant for dementia, remote stage II colon cancer, bladder cancer, cirrhosis, chronic pancytopenia, now presenting to the emergency department for evaluation of chest pain, nausea, and vomiting.  She is accompanied by her son who assists with the history.  Patient reportedly been in her usual state of health when she was eating last night and developed acute onset of pain in her left chest and back, appeared very uncomfortable, and was noted to have some diaphoresis, nausea, and vomiting.  EMS was called, administered 324 mg of aspirin, and brought the patient into the emergency department.  Assessment/Plan:   Principal Problem:   Hematemesis Active Problems:   Thrombocytopenia ([place])   Dementia without behavioral disturbance (HCC)   Cirrhosis, non-alcoholic (HCC)   Pancytopenia (HCC)   Hyperkalemia   GI bleed     Hematemesis /large esophageal varices - Patient with cirrhosis who had esophageal varices and gastric ulcer on EGD in 2013 presents with acute-chest pain and N/V, found to have pneumothorax on left, was treated with ASA 324 mg en route, and developed hematemesis in ED  -s/p EGD: Total of seven bands were deployed out of which 6 stayed in place. Evidence of fibrin plug noted in one of the varix, where the band fell off, but was immediately treated with placement of another band for control of bleeding. Continue octreotide drip for a total of 72 hours, plan to switch to nonselective beta-blocker thereafter if tolerated. Continue Protonix drip for a total of 24 hours, switch to PPI twice daily thereafter. Keep patient n.p.o. today, start clear liquids tomorrow. If there is evidence of rebleeding patient will need IR  evaluation for TIPS. Ideally patient would benefit from TIPS for treatment of gastric varices, however may not be a candidate for elective TIPS placement due to her age. -GI consult appreciated -agree with palliative care consult  anemia- ABLA  -transfuse for <7  Pneumothorax  - Presents with acute-onset chest pain, gripping her left chest, and is found to have small left pneumothorax  - CT surgery was consulted by ED physician and indicated that if repeat imaging in am is stable she could be discharged  - resolved on repeat x ray   Pancytopenia  - Attributed to her chronic liver disease, all CBC indices similar to priors  - Following serial CBCs   Cirrhosis  - Appears compensated  - Hold diuretics while NPO  Dementia  - She is calm on admission, does not recognize her son who reports this to be her baseline  - Continue supportive care, delirium precautions   -palliative care consult  Hyperkalemia  - Serum potassium is 5.7 in ED  - Continue cardiac monitoring, continue IVF -repeat in AM  obesity Body mass index is 33.47 kg/m.   Family Communication/Anticipated D/C date and plan/Code Status   DVT prophylaxis: scd Code Status: dnr Family Communication: at bedside Disposition Plan: Status is: Inpatient  Remains inpatient appropriate because:Inpatient level of care appropriate due to severity of illness   Dispo: The patient is from: Home              Anticipated d/c is to: tbd              Anticipated  d/c date is: 2 days              Patient currently is not medically stable to d/c.         Medical Consultants:    GI  Palliative care  Subjective:   Sleepy, husband trying to feed her  Objective:    Vitals:   09/20/20 0459 09/20/20 0800 09/20/20 0802 09/20/20 1028  BP: (!) 133/58   (!) 103/49  Pulse: 88 87 84 84  Resp: 20 18  18   Temp: 98.6 F (37 C)     TempSrc: Oral     SpO2: 99% 97% 98%   Weight: 85.7 kg     Height:         Intake/Output Summary (Last 24 hours) at 09/20/2020 1429 Last data filed at 09/20/2020 1300 Gross per 24 hour  Intake 1285.81 ml  Output 1050 ml  Net 235.81 ml   Filed Weights   09/18/20 2016 09/19/20 1119 09/20/20 0459  Weight: 86.2 kg 97.5 kg 85.7 kg    Exam:  General: Appearance:    Obese female chronically ill appearing     Lungs:     respirations unlabored  Heart:    Normal heart rate. Normal rhythm. No murmurs, rubs, or gallops.   MS:   All extremities are intact.   Neurologic:   Sleepy, will awaken and answer simple questions- does not know name of husband at bedside    Data Reviewed:   I have personally reviewed following labs and imaging studies:  Labs: Labs show the following:   Basic Metabolic Panel: Recent Labs  Lab 09/18/20 2102 09/18/20 2201 09/19/20 0301 09/20/20 0135  NA 135 137 137 138  K 5.7* 5.7* 4.8 5.5*  CL 104 104 107 110  CO2 24  --  23 23  GLUCOSE 138* 129* 142* 135*  BUN 19 29* 18 30*  CREATININE 0.96 0.90 1.04* 1.19*  CALCIUM 11.2*  --  10.6* 10.1   GFR Estimated Creatinine Clearance: 38.5 mL/min (A) (by C-G formula based on SCr of 1.19 mg/dL (H)). Liver Function Tests: Recent Labs  Lab 09/18/20 2102 09/19/20 0301 09/20/20 0135  AST 61* 24 30  ALT 18 15 18   ALKPHOS 80 67 66  BILITOT 2.0* 1.4* 1.4*  PROT 4.9* 4.4* 4.5*  ALBUMIN 2.6* 2.2* 2.3*   Recent Labs  Lab 09/18/20 2102  LIPASE 32   No results for input(s): AMMONIA in the last 168 hours. Coagulation profile Recent Labs  Lab 09/19/20 0301  INR 1.8*    CBC: Recent Labs  Lab 09/18/20 2102 09/18/20 2201 09/19/20 0301 09/19/20 0802 09/19/20 1515 09/20/20 0135  WBC 3.0*  --  6.0 9.8 13.2* 7.3  NEUTROABS 2.3  --   --   --   --   --   HGB 9.7* 10.9* 8.8* 9.7* 8.9* 7.6*  HCT 30.3* 32.0* 27.2* 30.1* 27.7* 22.3*  MCV 100.0  --  100.4* 99.3 101.8* 99.1  PLT 44*  --  56* PLATELET CLUMPS NOTED ON SMEAR, COUNT APPEARS DECREASED 64* 41*   Cardiac Enzymes: No  results for input(s): CKTOTAL, CKMB, CKMBINDEX, TROPONINI in the last 168 hours. BNP (last 3 results) No results for input(s): PROBNP in the last 8760 hours. CBG: No results for input(s): GLUCAP in the last 168 hours. D-Dimer: No results for input(s): DDIMER in the last 72 hours. Hgb A1c: No results for input(s): HGBA1C in the last 72 hours. Lipid Profile: No results for input(s): CHOL, HDL, LDLCALC,  TRIG, CHOLHDL, LDLDIRECT in the last 72 hours. Thyroid function studies: No results for input(s): TSH, T4TOTAL, T3FREE, THYROIDAB in the last 72 hours.  Invalid input(s): FREET3 Anemia work up: No results for input(s): VITAMINB12, FOLATE, FERRITIN, TIBC, IRON, RETICCTPCT in the last 72 hours. Sepsis Labs: Recent Labs  Lab 09/19/20 0301 09/19/20 0802 09/19/20 1515 09/20/20 0135  WBC 6.0 9.8 13.2* 7.3    Microbiology Recent Results (from the past 240 hour(s))  Resp Panel by RT-PCR (Flu A&B, Covid) Nasopharyngeal Swab     Status: None   Collection Time: 09/19/20  2:24 AM   Specimen: Nasopharyngeal Swab; Nasopharyngeal(NP) swabs in vial transport medium  Result Value Ref Range Status   SARS Coronavirus 2 by RT PCR NEGATIVE NEGATIVE Final    Comment: (NOTE) SARS-CoV-2 target nucleic acids are NOT DETECTED.  The SARS-CoV-2 RNA is generally detectable in upper respiratory specimens during the acute phase of infection. The lowest concentration of SARS-CoV-2 viral copies this assay can detect is 138 copies/mL. A negative result does not preclude SARS-Cov-2 infection and should not be used as the sole basis for treatment or other patient management decisions. A negative result may occur with  improper specimen collection/handling, submission of specimen other than nasopharyngeal swab, presence of viral mutation(s) within the areas targeted by this assay, and inadequate number of viral copies(<138 copies/mL). A negative result must be combined with clinical observations, patient  history, and epidemiological information. The expected result is Negative.  Fact Sheet for Patients:  EntrepreneurPulse.com.au  Fact Sheet for Healthcare Providers:  IncredibleEmployment.be  This test is no t yet approved or cleared by the [place] FDA and  has been authorized for detection and/or diagnosis of SARS-CoV-2 by FDA under an Emergency Use Authorization (EUA). This EUA will remain  in effect (meaning this test can be used) for the duration of the COVID-19 declaration under Section 564(b)(1) of the Act, 21 U.S.C.section 360bbb-3(b)(1), unless the authorization is terminated  or revoked sooner.       Influenza A by PCR NEGATIVE NEGATIVE Final   Influenza B by PCR NEGATIVE NEGATIVE Final    Comment: (NOTE) The Xpert Xpress SARS-CoV-2/FLU/RSV plus assay is intended as an aid in the diagnosis of influenza from Nasopharyngeal swab specimens and should not be used as a sole basis for treatment. Nasal washings and aspirates are unacceptable for Xpert Xpress SARS-CoV-2/FLU/RSV testing.  Fact Sheet for Patients: EntrepreneurPulse.com.au  Fact Sheet for Healthcare Providers: IncredibleEmployment.be  This test is not yet approved or cleared by the [place] FDA and has been authorized for detection and/or diagnosis of SARS-CoV-2 by FDA under an Emergency Use Authorization (EUA). This EUA will remain in effect (meaning this test can be used) for the duration of the COVID-19 declaration under Section 564(b)(1) of the Act, 21 U.S.C. section 360bbb-3(b)(1), unless the authorization is terminated or revoked.  Performed at [facility], [place] [address], [place], [place] [address]     Procedures and diagnostic studies:  DG Chest 2 View  Result Date: 09/19/2020 CLINICAL DATA:  79 year old female with small left pneumothorax on chest CTA 09/18/2020. EXAM: CHEST - 2 VIEW COMPARISON:  Portable  chest 09/19/2020 and earlier. FINDINGS: Semi upright AP and lateral views of the chest. Stable lung volumes. Stable mediastinal contours with tortuous thoracic aorta. Visualized tracheal air column is within normal limits. No pulmonary edema. No evidence of pleural effusion. No progression of the trace left lung base pneumothorax visible by CTA but not evident on subsequent radiographs. No  acute osseous abnormality identified. Paucity of bowel gas in the upper abdomen. IMPRESSION: 1. No progression of the trace left lung base pneumothorax, visible on recent CTA but not evident on subsequent radiographs. 2. No new cardiopulmonary abnormality. Electronically Signed   By: Genevie Ann M.D.   On: 09/19/2020 06:36   DG Chest Port 1 View  Result Date: 09/19/2020 CLINICAL DATA:  Chest pain EXAM: PORTABLE CHEST 1 VIEW COMPARISON:  September 18, 2020 FINDINGS: The heart size and mediastinal contours are mildly enlarged. Aortic knob calcifications are seen. Both lungs are clear. The visualized skeletal structures are unremarkable. IMPRESSION: No active disease. Electronically Signed   By: Prudencio Pair M.D.   On: 09/19/2020 02:10   DG Chest Port 1 View  Result Date: 09/18/2020 CLINICAL DATA:  Chest pain. EXAM: PORTABLE CHEST 1 VIEW COMPARISON:  08/29/2020 FINDINGS: Cardiomegaly is not significantly changed. Stable aortic atherosclerosis and tortuosity. Minimal subpleural scarring in both lung bases. No acute airspace disease. No pleural fluid or pneumothorax. Bones are diffusely under mineralized. Chronic deformity of left proximal humerus. IMPRESSION: Stable cardiomegaly.  Bibasilar scarring.  No acute abnormality. Aortic Atherosclerosis (ICD10-I70.0). Electronically Signed   By: Keith Rake M.D.   On: 09/18/2020 21:22   CT Angio Chest/Abd/Pel for Dissection W and/or Wo Contrast  Result Date: 09/19/2020 CLINICAL DATA:  Abdominal pain, question dissection EXAM: CT ANGIOGRAPHY CHEST, ABDOMEN AND PELVIS TECHNIQUE:  Non-contrast CT of the chest was initially obtained. Multidetector CT imaging through the chest, abdomen and pelvis was performed using the standard protocol during bolus administration of intravenous contrast. Multiplanar reconstructed images and MIPs were obtained and reviewed to evaluate the vascular anatomy. CONTRAST:  183m OMNIPAQUE IOHEXOL 350 MG/ML SOLN COMPARISON:  None. FINDINGS: CTA CHEST FINDINGS Cardiovascular: --Heart: The heart size is normal.  There is nopericardial effusion. --Aorta: The course and caliber of the thoracic aorta are normal. There is scattered aortic atherosclerotic calcification. Precontrast images show no aortic intramural hematoma. There is no blood pool, dissection or penetrating ulcer demonstrated on arterial phase postcontrast imaging. There is a conventional 3 vessel aortic arch branching pattern. The proximal arch vessels are widely patent. --Pulmonary Arteries: Contrast timing is optimized for preferential opacification of the aorta. Within that limitation, normal central pulmonary arteries. Mediastinum/Nodes: No mediastinal, hilar or axillary lymphadenopathy. The visualized thyroid and thoracic esophageal course are unremarkable. Lungs/Pleura: There is a small left basilar pneumothorax present. No mediastinal shift. No pleural effusion. Musculoskeletal: No chest wall abnormality. No acute osseous findings. Review of the MIP images confirms the above findings. CTA ABDOMEN AND PELVIS FINDINGS VASCULAR Aorta: Normal caliber aorta without aneurysm, dissection, vasculitis or hemodynamically significant stenosis. There is scattered aortic atherosclerosis. Celiac: No aneurysm, dissection or hemodynamically significant stenosis. Normal branching pattern SMA: There is mild stenosis at the origin of the SMA due to atherosclerosis. Renals: Single renal arteries bilaterally. No aneurysm, dissection, stenosis or evidence of fibromuscular dysplasia. IMA: Patent without abnormality.  Inflow: No aneurysm, stenosis or dissection. Veins: Normal course and caliber of the major veins. Assessment is otherwise limited by the arterial dominant contrast phase. Review of the MIP images confirms the above findings. NON-VASCULAR Hepatobiliary: There is a slightly shrunken nodular liver contour seen throughout. The patient is status post cholecystectomy. Again noted is a tiny 5 mm hypodense lesion in the posterior right liver lobe. 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 Pancreas: Normal contours without ductal dilatation. No peripancreatic fluid collection. Spleen: Normal arterial phase splenic  enhancement pattern. There is mild splenomegaly measuring 14 cm in craniocaudad dimension. Adrenals/Urinary Tract: --Adrenal glands: Normal. Kidneys: A small amount of air seen within the bilateral renal pelvis which could be due to recent catheter. No hydronephrosis or collecting system calculi. 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 Stomach/Bowel: --Stomach/Duodenum: A small hiatal hernia is present. There appears to be question of fat stranding changes seen at the gastroduodenal junction. --Small bowel: No dilatation or inflammation. --Colon: Scattered colonic diverticula are noted. The patient is status post cholecystectomy. A large amount of rectal colonic stool is present. Lymphatic: No abdominal or pelvic lymphadenopathy. A small amount abdominopelvic ascites is present. Reproductive: No free fluid in the pelvis. Musculoskeletal. No bony spinal canal stenosis or focal osseous abnormality. There is diffuse osteopenia. Other: None. Review of the MIP images confirms the above findings. IMPRESSION: 1. No acute aortic abnormality. 2. Small left basilar pneumothorax. 3. Small amount of abdominopelvic ascites. 4. Findings  which may be suggestive of gastritis at the gastroduodenal junction. 5. Findings of cirrhosis and portal hypertension 6. Chronic portal vein/splenic confluence thrombosis. 7.  Aortic Atherosclerosis (ICD10-I70.0). 8. These results were called by telephone at the time of interpretation on 09/19/2020 at 12:06 am to provider DAN FLOYD , who verbally acknowledged these results. Electronically Signed   By: Prudencio Pair M.D.   On: 09/19/2020 00:05    Medications:    sodium chloride   Intravenous Once   lactulose  10 g Oral BID   [START ON 09/22/2020] pantoprazole  40 mg Intravenous Q12H   rifaximin  550 mg Oral BID   sodium chloride flush  3 mL Intravenous Q12H   Continuous Infusions:  ciprofloxacin 400 mg (09/20/20 0457)   lactated ringers 10 mL/hr at 09/19/20 2032   octreotide  (SANDOSTATIN)    IV infusion 50 mcg/hr (09/20/20 0448)   pantoprozole (PROTONIX) infusion 8 mg/hr (09/20/20 0047)     LOS: 1 day   Geradine Girt  [facility]   How to contact the [facility] Attending or Consulting provider [place] or covering provider during after hours [place], for this patient?  1. Check the care team in [facility] and look for a) attending/consulting [facility] provider listed and b) the [facility] team listed 2. Log into [email or web address] and use [place] universal password to access. If you do not have the password, please contact the hospital operator. 3. Locate the [facility] provider you are looking for under [facility] and page to a number that you can be directly reached. 4. If you still have difficulty reaching the provider, please page the [facility] (Director on Call) for the Hospitalists listed on amion for assistance.  09/20/2020, 2:29 PM

## 2020-09-21 DIAGNOSIS — Z515 Encounter for palliative care: Secondary | ICD-10-CM | POA: Diagnosis not present

## 2020-09-21 DIAGNOSIS — K7460 Unspecified cirrhosis of liver: Secondary | ICD-10-CM | POA: Diagnosis not present

## 2020-09-21 DIAGNOSIS — R627 Adult failure to thrive: Secondary | ICD-10-CM

## 2020-09-21 DIAGNOSIS — K920 Hematemesis: Secondary | ICD-10-CM | POA: Diagnosis not present

## 2020-09-21 DIAGNOSIS — F0390 Unspecified dementia without behavioral disturbance: Secondary | ICD-10-CM | POA: Diagnosis not present

## 2020-09-21 DIAGNOSIS — Z7189 Other specified counseling: Secondary | ICD-10-CM | POA: Diagnosis not present

## 2020-09-21 LAB — COMPREHENSIVE METABOLIC PANEL
ALT: 20 U/L (ref 0–44)
AST: 31 U/L (ref 15–41)
Albumin: 2.1 g/dL — ABNORMAL LOW (ref 3.5–5.0)
Alkaline Phosphatase: 56 U/L (ref 38–126)
Anion gap: 6 (ref 5–15)
BUN: 30 mg/dL — ABNORMAL HIGH (ref 8–23)
CO2: 24 mmol/L (ref 22–32)
Calcium: 10.1 mg/dL (ref 8.9–10.3)
Chloride: 111 mmol/L (ref 98–111)
Creatinine, Ser: 1.19 mg/dL — ABNORMAL HIGH (ref 0.44–1.00)
GFR, Estimated: 46 mL/min — ABNORMAL LOW (ref >60)
Glucose, Bld: 122 mg/dL — ABNORMAL HIGH (ref 70–99)
Potassium: 4.4 mmol/L (ref 3.5–5.1)
Sodium: 141 mmol/L (ref 135–145)
Total Bilirubin: 1.8 mg/dL — ABNORMAL HIGH (ref 0.3–1.2)
Total Protein: 4 g/dL — ABNORMAL LOW (ref 6.5–8.1)

## 2020-09-21 LAB — PREPARE RBC (CROSSMATCH)

## 2020-09-21 LAB — CBC
HCT: 19.1 % — ABNORMAL LOW (ref 36.0–46.0)
Hemoglobin: 6.5 g/dL — CL (ref 12.0–15.0)
MCH: 33.9 pg (ref 26.0–34.0)
MCHC: 34 g/dL (ref 30.0–36.0)
MCV: 99.5 fL (ref 80.0–100.0)
Platelets: 29 10*3/uL — CL (ref 150–400)
RBC: 1.92 MIL/uL — ABNORMAL LOW (ref 3.87–5.11)
RDW: 17.4 % — ABNORMAL HIGH (ref 11.5–15.5)
WBC: 2.3 10*3/uL — ABNORMAL LOW (ref 4.0–10.5)
nRBC: 0 % (ref 0.0–0.2)

## 2020-09-21 LAB — HEMOGLOBIN AND HEMATOCRIT, BLOOD
HCT: 23.2 % — ABNORMAL LOW (ref 36.0–46.0)
Hemoglobin: 8 g/dL — ABNORMAL LOW (ref 12.0–15.0)

## 2020-09-21 MED ORDER — SODIUM CHLORIDE 0.9% IV SOLUTION: Freq: Once | INTRAVENOUS | Status: DC

## 2020-09-21 MED ORDER — LACTULOSE 10 GM/15ML PO SOLN
20.0000 g | Freq: Three times a day (TID) | ORAL | Status: DC
  Administered 2020-09-22 – 2020-09-23 (×5): 20 g via ORAL
  Filled 2020-09-21 (×5): qty 30

## 2020-09-21 NOTE — Progress Notes (Signed)
Progress Note    [name]  [name]:[id number] DOB: [birth year]  DOA: 09/18/2020 PCP: Erica Frizzle, MD    Brief Narrative:     Medical records reviewed and are as summarized below:  [name] is an 79 y.o. female with medical history significant for dementia, remote stage II colon cancer, bladder cancer, cirrhosis, chronic pancytopenia, now presenting to the emergency department for evaluation of chest pain, nausea, and vomiting.  She is accompanied by her son who assists with the history.  Patient reportedly been in her usual state of health when she was eating last night and developed acute onset of pain in her left chest and back, appeared very uncomfortable, and was noted to have some diaphoresis, nausea, and vomiting.  EMS was called, administered 324 mg of aspirin, and brought the patient into the emergency department.  Assessment/Plan:   Principal Problem:   Hematemesis Active Problems:   Thrombocytopenia ([place])   Dementia without behavioral disturbance (HCC)   Cirrhosis, non-alcoholic (HCC)   Pancytopenia (HCC)   Hyperkalemia   GI bleed     Hematemesis /large esophageal varices - Patient with cirrhosis who had esophageal varices and gastric ulcer on EGD in 2013 presents with acute-chest pain and N/V, found to have pneumothorax on left, was treated with ASA 324 mg en route, and developed hematemesis in ED  -s/p EGD: Total of seven bands were deployed out of which 6 stayed in place. Evidence of fibrin plug noted in one of the varix, where the band fell off, but was immediately treated with placement of another band for control of bleeding. -octreotide drip for a total of 72 hours -Continue Protonix  -GI consult appreciated -agree with palliative care consult and plan will be for d/c home with hospice when able  Hyperammonemia -PO lactulose as able-- can use enema if necessary -continue rifaximin   anemia- ABLA  -transfuse for <7  Pneumothorax  -  Presents with acute-onset chest pain, gripping her left chest, and is found to have small left pneumothorax  - CT surgery was consulted by ED physician and indicated that if repeat imaging in am is stable she could be discharged  - resolved on repeat x ray   Pancytopenia  - Attributed to her chronic liver disease, all CBC indices similar to priors  - Following serial CBCs   Cirrhosis  - Appears compensated  - Hold diuretics while NPO  Dementia  - very confused  Hyperkalemia  - Serum potassium is 5.7 in ED  - resolved  obesity Body mass index is 33.47 kg/m.   Poor PO intake, agree with transition to hospice.  Will work on setting up safe d/c plan with family/hospice  Family Communication/Anticipated D/C date and plan/Code Status   DVT prophylaxis: scd Code Status: dnr Family Communication: at bedside Disposition Plan: Status is: Inpatient  Remains inpatient appropriate because:Inpatient level of care appropriate due to severity of illness   Dispo: The patient is from: Home              Anticipated d/c is to: tbd              Anticipated d/c date is: 2 days              Patient currently is not medically stable to d/c.         Medical Consultants:    GI  Palliative care  Subjective:   Minimally responsive today  Objective:    Vitals:  09/20/20 2358 09/21/20 0449 09/21/20 0524 09/21/20 1046  BP: (!) 130/50 (!) 118/50 (!) 112/46 (!) 122/59  Pulse:  68 69 70  Resp: 14 20 19 20   Temp: 98.6 F (37 C) 99.1 F (37.3 C) 98.9 F (37.2 C) 97.9 F (36.6 C)  TempSrc:  Axillary Axillary Oral  SpO2:  94% 96%   Weight:      Height:        Intake/Output Summary (Last 24 hours) at 09/21/2020 1551 Last data filed at 09/21/2020 1200 Gross per 24 hour  Intake 2347.95 ml  Output 850 ml  Net 1497.95 ml   Filed Weights   09/18/20 2016 09/19/20 1119 09/20/20 0459  Weight: 86.2 kg 97.5 kg 85.7 kg    Exam:   General: Appearance:    Withdrawn, ill  appearing female -picking at IV     Lungs:     respirations unlabored  Heart:    Normal heart rate. Normal rhythm. No murmurs, rubs, or gallops.   MS:   All extremities are intact.   Neurologic:   Withdrawn, not interacting     Data Reviewed:   I have personally reviewed following labs and imaging studies:  Labs: Labs show the following:   Basic Metabolic Panel: Recent Labs  Lab 09/18/20 2102 09/18/20 2201 09/19/20 0301 09/20/20 0135 09/21/20 0118  NA 135 137 137 138 141  K 5.7* 5.7* 4.8 5.5* 4.4  CL 104 104 107 110 111  CO2 24  --  23 23 24   GLUCOSE 138* 129* 142* 135* 122*  BUN 19 29* 18 30* 30*  CREATININE 0.96 0.90 1.04* 1.19* 1.19*  CALCIUM 11.2*  --  10.6* 10.1 10.1   GFR Estimated Creatinine Clearance: 38.5 mL/min (A) (by C-G formula based on SCr of 1.19 mg/dL (H)). Liver Function Tests: Recent Labs  Lab 09/18/20 2102 09/19/20 0301 09/20/20 0135 09/21/20 0118  AST 61* 24 30 31   ALT 18 15 18 20   ALKPHOS 80 67 66 56  BILITOT 2.0* 1.4* 1.4* 1.8*  PROT 4.9* 4.4* 4.5* 4.0*  ALBUMIN 2.6* 2.2* 2.3* 2.1*   Recent Labs  Lab 09/18/20 2102  LIPASE 32   Recent Labs  Lab 09/20/20 1403  AMMONIA 103*   Coagulation profile Recent Labs  Lab 09/19/20 0301  INR 1.8*    CBC: Recent Labs  Lab 09/18/20 2102 09/18/20 2201 09/19/20 0301 09/19/20 0802 09/19/20 1515 09/20/20 0135 09/21/20 0118 09/21/20 1224  WBC 3.0*  --  6.0 9.8 13.2* 7.3 2.3*  --   NEUTROABS 2.3  --   --   --   --   --   --   --   HGB 9.7*   < > 8.8* 9.7* 8.9* 7.6* 6.5* 8.0*  HCT 30.3*   < > 27.2* 30.1* 27.7* 22.3* 19.1* 23.2*  MCV 100.0  --  100.4* 99.3 101.8* 99.1 99.5  --   PLT 44*  --  56* PLATELET CLUMPS NOTED ON SMEAR, COUNT APPEARS DECREASED 64* 41* 29*  --    < > = values in this interval not displayed.   Cardiac Enzymes: No results for input(s): CKTOTAL, CKMB, CKMBINDEX, TROPONINI in the last 168 hours. BNP (last 3 results) No results for input(s): PROBNP in the last 8760  hours. CBG: No results for input(s): GLUCAP in the last 168 hours. D-Dimer: No results for input(s): DDIMER in the last 72 hours. Hgb A1c: No results for input(s): HGBA1C in the last 72 hours. Lipid Profile: No results for input(s):  CHOL, HDL, LDLCALC, TRIG, CHOLHDL, LDLDIRECT in the last 72 hours. Thyroid function studies: No results for input(s): TSH, T4TOTAL, T3FREE, THYROIDAB in the last 72 hours.  Invalid input(s): FREET3 Anemia work up: No results for input(s): VITAMINB12, FOLATE, FERRITIN, TIBC, IRON, RETICCTPCT in the last 72 hours. Sepsis Labs: Recent Labs  Lab 09/19/20 0802 09/19/20 1515 09/20/20 0135 09/21/20 0118  WBC 9.8 13.2* 7.3 2.3*    Microbiology Recent Results (from the past 240 hour(s))  Resp Panel by RT-PCR (Flu A&B, Covid) Nasopharyngeal Swab     Status: None   Collection Time: 09/19/20  2:24 AM   Specimen: Nasopharyngeal Swab; Nasopharyngeal(NP) swabs in vial transport medium  Result Value Ref Range Status   SARS Coronavirus 2 by RT PCR NEGATIVE NEGATIVE Final    Comment: (NOTE) SARS-CoV-2 target nucleic acids are NOT DETECTED.  The SARS-CoV-2 RNA is generally detectable in upper respiratory specimens during the acute phase of infection. The lowest concentration of SARS-CoV-2 viral copies this assay can detect is 138 copies/mL. A negative result does not preclude SARS-Cov-2 infection and should not be used as the sole basis for treatment or other patient management decisions. A negative result may occur with  improper specimen collection/handling, submission of specimen other than nasopharyngeal swab, presence of viral mutation(s) within the areas targeted by this assay, and inadequate number of viral copies(<138 copies/mL). A negative result must be combined with clinical observations, patient history, and epidemiological information. The expected result is Negative.  Fact Sheet for Patients:  EntrepreneurPulse.com.au  Fact  Sheet for Healthcare Providers:  IncredibleEmployment.be  This test is no t yet approved or cleared by the [place] FDA and  has been authorized for detection and/or diagnosis of SARS-CoV-2 by FDA under an Emergency Use Authorization (EUA). This EUA will remain  in effect (meaning this test can be used) for the duration of the COVID-19 declaration under Section 564(b)(1) of the Act, 21 U.S.C.section 360bbb-3(b)(1), unless the authorization is terminated  or revoked sooner.       Influenza A by PCR NEGATIVE NEGATIVE Final   Influenza B by PCR NEGATIVE NEGATIVE Final    Comment: (NOTE) The Xpert Xpress SARS-CoV-2/FLU/RSV plus assay is intended as an aid in the diagnosis of influenza from Nasopharyngeal swab specimens and should not be used as a sole basis for treatment. Nasal washings and aspirates are unacceptable for Xpert Xpress SARS-CoV-2/FLU/RSV testing.  Fact Sheet for Patients: EntrepreneurPulse.com.au  Fact Sheet for Healthcare Providers: IncredibleEmployment.be  This test is not yet approved or cleared by the [place] FDA and has been authorized for detection and/or diagnosis of SARS-CoV-2 by FDA under an Emergency Use Authorization (EUA). This EUA will remain in effect (meaning this test can be used) for the duration of the COVID-19 declaration under Section 564(b)(1) of the Act, 21 U.S.C. section 360bbb-3(b)(1), unless the authorization is terminated or revoked.  Performed at [facility], [place] [address], [place], [place] [address]     Procedures and diagnostic studies:  No results found.  Medications:   . sodium chloride   Intravenous Once  . sodium chloride   Intravenous Once  . lactulose  20 g Oral TID  . [START ON 09/22/2020] pantoprazole  40 mg Intravenous Q12H  . rifaximin  550 mg Oral BID  . sodium chloride flush  3 mL Intravenous Q12H   Continuous Infusions: . ciprofloxacin 400  mg (09/21/20 1525)  . lactated ringers 50 mL/hr at 09/21/20 0716  . octreotide  (SANDOSTATIN)  IV infusion 50 mcg/hr (09/21/20 0653)  . pantoprozole (PROTONIX) infusion 8 mg/hr (09/21/20 1226)     LOS: 2 days   Geradine Girt  [facility]   How to contact the [facility] Attending or Consulting provider [place] or covering provider during after hours [place], for this patient?  1. Check the care team in [facility] and look for a) attending/consulting TRH provider listed and b) the [facility] team listed 2. Log into [email or web address] and use [place]'s universal password to access. If you do not have the password, please contact the hospital operator. 3. Locate the [facility] provider you are looking for under [facility] and page to a number that you can be directly reached. 4. If you still have difficulty reaching the provider, please page the [facility] (Director on Call) for the Hospitalists listed on amion for assistance.  09/21/2020, 3:51 PM

## 2020-09-21 NOTE — Progress Notes (Addendum)
Daily Progress Note   Patient Name: [name]       Date: 09/21/2020 DOB: 09-06-1941  Age: 79 y.o. MRN#: [id number] Attending Physician: Geradine Girt, DO Primary Care Physician: [name], MD Admit Date: 09/18/2020  Reason for Consultation/Follow-up: Establishing goals of care  Subjective: Patient does not wake to voice or gentle touch, nonverbal  Length of Stay: 2  Current Medications: Scheduled Meds:  . sodium chloride   Intravenous Once  . sodium chloride   Intravenous Once  . lactulose  20 g Oral TID  . [START ON 09/22/2020] pantoprazole  40 mg Intravenous Q12H  . rifaximin  550 mg Oral BID  . sodium chloride flush  3 mL Intravenous Q12H    Continuous Infusions: . ciprofloxacin 400 mg (09/21/20 0339)  . lactated ringers 50 mL/hr at 09/21/20 0716  . octreotide  (SANDOSTATIN)    IV infusion 50 mcg/hr (09/21/20 0653)  . pantoprozole (PROTONIX) infusion 8 mg/hr (09/21/20 1226)    PRN Meds: ondansetron **OR** ondansetron (ZOFRAN) IV  Physical Exam Constitutional:      General: She is not in acute distress.    Comments: Does not wake to voice or gentle touch  Pulmonary:     Effort: Pulmonary effort is normal.  Skin:    General: Skin is warm and dry.  Neurological:     Mental Status: She is disoriented.             Vital Signs: BP (!) 122/59 (BP Location: Right Arm)   Pulse 70   Temp 97.9 F (36.6 C) (Oral)   Resp 20   Ht _0  (1.6 m)   Wt 85.7 kg   SpO2 96%   BMI 33.47 kg/m  SpO2: SpO2: 96 % O2 Device: O2 Device: Room Air O2 Flow Rate: O2 Flow Rate (L/min): 2 L/min  Intake/output summary:   Intake/Output Summary (Last 24 hours) at 09/21/2020 1353 Last data filed at 09/21/2020 1200 Gross per 24 hour  Intake 2227.95 ml  Output 850 ml  Net 1377.95  ml   LBM: Last BM Date: 09/17/20 Baseline Weight: Weight: 86.2 kg Most recent weight: Weight: 85.7 kg       Palliative Assessment/Data: PPS 20%    Flowsheet Rows   Flowsheet Row Most Recent Value  Intake Tab   Referral Department Gastroenterology  Unit at Time of Referral Intermediate Care Unit  Palliative Care Primary Diagnosis Neurology  Date Notified 09/20/20  Palliative Care Type Return patient Palliative Care  Reason for referral Clarify Goals of Care  Date of Admission 09/18/20  Date first seen by Palliative Care 09/20/20  # of days Palliative referral response time 0 Day(s)  # of days IP prior to Palliative referral 2  Clinical Assessment   Palliative Performance Scale Score 20%  Psychosocial & Spiritual Assessment   Palliative Care Outcomes   Patient/Family meeting held? Yes  Who was at the meeting? spouse and daughter      Patient Active Problem List   Diagnosis Date Noted  . Hematemesis 09/19/2020  . Hyperkalemia 09/19/2020  . GI bleed 09/19/2020  . Pneumothorax on left   . Acute respiratory failure with hypoxia ([place]) 10/12/2019  . Acute  lower UTI   . Pancytopenia ([place])   . Community acquired pneumonia   . Hypokalemia 11/02/2016  . Acute encephalopathy 11/02/2016  . Vasovagal syncope 11/02/2016  . Near syncope 11/02/2016  . Essential hypertension 09/11/2016  . Renal stones 05/04/2014  . Nephrolithiasis 04/06/2014  . 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 Hydronephrosis, left 02/28/2014  . E. coli pyelonephritis 02/28/2014  . Leukopenia 03/05/2012  . Varices, esophageal ([place]) 02/29/2012  . Acute upper GI bleeding 02/29/2012  . Gastric ulcer, acute with hemorrhage 02/29/2012  . Dementia without behavioral disturbance ([place]) 02/26/2012  . Cirrhosis, non-alcoholic ([place]) 50/93/2671  . Melena 02/26/2012  . Acute blood loss anemia 02/26/2012  . Diarrhea 02/26/2012  . Fracture of humerus 02/26/2012  . Ventral hernia without obstruction or gangrene  01/13/2012  . Colon cancer ([place]) 08/26/2011  . Bladder cancer ([place]) 08/26/2011  . Thrombocytopenia ([place]) 08/26/2011  . Anemia, iron deficiency 08/26/2011    Palliative Care Assessment & Plan   HPI: 79 y.o. female  with past medical history of dementia, colon cancer, bladder cancer, NASH and cirrhosis, chronic pancytopenia, HTN, GERD, and GI bleeds admitted on 09/18/2020 with chest pain, nausea, and vomiting. Found to have left pneumothorax that resolved on repeat x ray. Developed hematemesis in ED. Had EGD with 7 bands placed. PMT consulted by Dr. [name] for goals of care discussion.  Assessment: Met with patient's spouse and daughter [name] at bedside.  I introduced Palliative Medicine as specialized medical care for people living with serious illness. It focuses on providing relief from the symptoms and stress of a serious illness. The goal is to improve quality of life for both the patient and the family.  Reviewed patient's baseline status - has been declining recently. Very poor PO intake. Does not recognize family members. Mostly nonverbal.    We discussed patient's current illness and what it means in the larger context of patient's on-going co-morbidities.  Natural disease trajectory and expectations at EOL were discussed. Discussed her liver disease. Discussed dementia disease process. Discussed limitations of medical interventions.   I attempted to elicit values and goals of care important to the patient.  They share about desire to keep patient at home. They share how detrimental hospitalizations are to patient.   The difference between aggressive medical intervention and comfort care was considered in light of the patient's goals of care. Spouse and daughter agree that focusing more on comfort and avoiding further aggressive interventions is appropriate.   Advance directives, concepts specific to code status, artificial feeding and hydration, and rehospitalization were  considered and discussed. They confirm DNR status. They would not want feeding tube at any point. They do not want any invasive medical work up. They hope to avoid further hospitalization.   Discussed with family the importance of continued conversation with family and the medical providers regarding overall plan of care and treatment options, ensuring decisions are within the context of the patient's values and GOCs.    Hospice and Palliative Care services outpatient were explained and offered. Discussed hospice philosophy of care and type of support provided. After extensive discussion and all questions answered, family agrees hospice is in line with goals of care.   Questions and concerns were addressed. The family was encouraged to call with questions or concerns.   Recommendations/Plan:  Home with hospice though family does not want to expedite discharge  No invasive workup, no feeding tube  Will follow  Code Status:  DNR  Prognosis:   poor prognosis r/t  liver disease, low hgb, poor PO intake, lethargy  Discharge Planning:  Home with Hospice  Care plan was discussed with spouse and daughter, plan shared with Dr. Eliseo Squires, Dr. [name], and [facility] team  Thank you for allowing the Palliative Medicine Team to assist in the care of this patient.   Total Time 75 minutes Prolonged Time Billed  yes       Greater than 50%  of this time was spent counseling and coordinating care related to the above assessment and plan.  Juel Burrow, DNP, [facility] Palliative Medicine Team Team Phone # [phone]  Pager [phone]

## 2020-09-21 NOTE — TOC Initial Note (Signed)
Transition of Care [facility]) - Initial/Assessment Note     Patient Details  Name: [name] MRN: [id number] Date of Birth: Jun 24, 1941  Transition of Care [facility]) CM/SW Contact:    Dawayne Patricia, RN Phone Number: 09/21/2020, 4:13 PM  Clinical Narrative:                 Noted referral for home hospice- call made to pt's daughter [name] to confirm choice- per discussion over the phone- daughter states that they are leaning towards Hospice but want to speak with someone from Hospice to be sure their goals and what they want to do will be allowed (ie, treat PNA, ect)- they are familiar with [facility] and would like to start there but not really an official referral yet, they are open to looking at other agencies if [facility] can not provide what they need. Explained Medicare.gov for agency choice.  Also discussed DME needs- per daughter pt has needed DME in the home, the only equipment they are considering is a hospital bed.   Daughter and family requesting someone from [facility] reach out to them to answer questions before they make decision about going home with hospice. Call made to on call service center (spoke with [facility]) for [place]- awaiting return call to make request.   Expected Discharge Plan: Home w Hospice Care Barriers to Discharge: Continued Medical Work up   Patient Goals and CMS Choice Patient states their goals for this hospitalization and ongoing recovery are:: return home CMS Medicare.gov Compare Post Acute Care list provided to:: Patient Represenative (must comment) (daughter) Choice offered to / list presented to : Adult Children  Expected Discharge Plan and Services Expected Discharge Plan: Home w Hospice Care   Discharge Planning Services: CM Consult Post Acute Care Choice: Hospice Living arrangements for the past 2 months: [place] Date [facility] Agency Contacted: 09/21/20 Time [place]: 0786    Prior Living Arrangements/Services Living arrangements for the past 2 months: [place] Lives with:: Spouse Patient language and need for interpreter reviewed:: Yes        Need for Family Participation in Patient Care: Yes (Comment) Care giver support system in place?: Yes (comment)   Criminal Activity/Legal Involvement Pertinent to Current Situation/Hospitalization: No - Comment as needed  Activities of Daily Living Home Assistive Devices/Equipment: Walker (specify type) ADL Screening (condition at time of admission) Patient's cognitive ability adequate to safely complete daily activities?: No Is the patient deaf or have difficulty hearing?: No Does the patient have difficulty seeing, even when wearing glasses/contacts?: No Does the patient have difficulty concentrating, remembering, or making decisions?: Yes Patient able to express need for assistance with ADLs?: No Does the patient have difficulty dressing or bathing?: Yes Independently performs ADLs?: No Communication: Needs assistance Is this a change from baseline?: Pre-admission baseline Dressing (OT): Needs assistance Is this a change from baseline?: Pre-admission baseline Grooming: Needs assistance Is this a change from baseline?: Pre-admission baseline Feeding: Needs assistance Bathing: Needs assistance Is this a change from baseline?: Pre-admission baseline Toileting: Needs assistance Is this a change from baseline?: Pre-admission baseline In/Out Bed: Needs assistance Is this a change from baseline?: Pre-admission baseline Walks in Home: Needs assistance Does the patient have difficulty walking or climbing stairs?: Yes  Weakness of Legs: Both Weakness of Arms/Hands: Both  Permission Sought/Granted Permission sought to share information with : Chartered certified accountant granted to share information with : Yes,  Verbal Permission Granted     Permission granted to share info w AGENCY: [facility]        Emotional Assessment         Alcohol / Substance Use: Not Applicable Psych Involvement: No (comment)  Admission diagnosis:  Hematemesis [K92.0] Pneumothorax on left [J93.9] Chest pain [R07.9] Hematemesis with nausea [K92.0] Chest pain in adult [R07.9] GI bleed [K92.2] Patient Active Problem List   Diagnosis Date Noted  . Hematemesis 09/19/2020  . Hyperkalemia 09/19/2020  . GI bleed 09/19/2020  . Pneumothorax on left   . Acute respiratory failure with hypoxia ([place]) 10/12/2019  . Acute lower UTI   . Pancytopenia ([place])   . Community acquired pneumonia   . Hypokalemia 11/02/2016  . Acute encephalopathy 11/02/2016  . Vasovagal syncope 11/02/2016  . Near syncope 11/02/2016  . Essential hypertension 09/11/2016  . Renal stones 05/04/2014  . Nephrolithiasis 04/06/2014  . 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 Hydronephrosis, left 02/28/2014  . E. coli pyelonephritis 02/28/2014  . Leukopenia 03/05/2012  . Varices, esophageal ([place]) 02/29/2012  . Acute upper GI bleeding 02/29/2012  . Gastric ulcer, acute with hemorrhage 02/29/2012  . Dementia without behavioral disturbance ([place]) 02/26/2012  . Cirrhosis, non-alcoholic ([place]) 19/41/7408  . Melena 02/26/2012  . Acute blood loss anemia 02/26/2012  . Diarrhea 02/26/2012  . Fracture of humerus 02/26/2012  . Ventral hernia without obstruction or gangrene 01/13/2012  . Colon cancer ([place]) 08/26/2011  . Bladder cancer ([place]) 08/26/2011  . Thrombocytopenia ([place]) 08/26/2011  . Anemia, iron deficiency 08/26/2011   PCP:  [name], MD Pharmacy:   [facility] [place], [place] - [address] [place] [address] [place]  [id number] Phone: [phone] Fax: [phone]     Social Determinants of Health (SDOH) Interventions    Readmission Risk Interventions No  flowsheet data found.

## 2020-09-21 NOTE — TOC Progression Note (Signed)
Transition of Care [facility]) - Progression Note    Patient Details  Name: [name] MRN: [id number] Date of Birth: Feb 19, 1941  Transition of Care [facility]) CM/SW Contact  Graves-Bigelow, Ocie Cornfield, RN Phone Number: 09/21/2020, 5:23 PM  Clinical Narrative:  Risk for readmission assessment completed. Case Manager received a call back from [facility]. Otila Kluver will call the family to discuss plan of care. Demographics faxed to [facility] @ [phone] to review to see if patient is eligible for hospice services. Case Manager will continue to follow for additional transition of care needs.   Expected Discharge Plan: Home w Hospice Care Barriers to Discharge: Continued Medical Work up  Expected Discharge Plan and Services Expected Discharge Plan: [place]   Discharge Planning Services: CM Consult Post Acute Care Choice: Hospice Living arrangements for the past 2 months: [place] Date [place]: 09/21/20 Time [place]: [id number]     Social Determinants of Health (SDOH) Interventions    Readmission Risk Interventions Readmission Risk Prevention Plan 09/21/2020  Transportation Screening Complete  HRI or [place] Complete  Social Work Consult for [place] Planning/Counseling Complete  Palliative Care Screening Complete  Medication Review Press photographer) Complete  Some recent data might be hidden

## 2020-09-21 NOTE — Progress Notes (Signed)
Patient ID: [name], female   DOB: 1941/03/08, 79 y.o.   MRN: [id number]   Palliative medicine consult noted and agree with family decision for hospice. Husband in room. Patient lying in bed nonverbal.  Agree with focus on comfort. Appreciated palliative medicine's help. [facility] will sign off. Call if questions.

## 2020-09-22 DIAGNOSIS — Z7189 Other specified counseling: Secondary | ICD-10-CM | POA: Diagnosis not present

## 2020-09-22 DIAGNOSIS — K920 Hematemesis: Secondary | ICD-10-CM | POA: Diagnosis not present

## 2020-09-22 DIAGNOSIS — K7460 Unspecified cirrhosis of liver: Secondary | ICD-10-CM | POA: Diagnosis not present

## 2020-09-22 DIAGNOSIS — F0390 Unspecified dementia without behavioral disturbance: Secondary | ICD-10-CM | POA: Diagnosis not present

## 2020-09-22 DIAGNOSIS — Z515 Encounter for palliative care: Secondary | ICD-10-CM | POA: Diagnosis not present

## 2020-09-22 LAB — CBC
HCT: 21.9 % — ABNORMAL LOW (ref 36.0–46.0)
Hemoglobin: 7.4 g/dL — ABNORMAL LOW (ref 12.0–15.0)
MCH: 33.2 pg (ref 26.0–34.0)
MCHC: 33.8 g/dL (ref 30.0–36.0)
MCV: 98.2 fL (ref 80.0–100.0)
Platelets: 30 10*3/uL — ABNORMAL LOW (ref 150–400)
RBC: 2.23 MIL/uL — ABNORMAL LOW (ref 3.87–5.11)
RDW: 18.2 % — ABNORMAL HIGH (ref 11.5–15.5)
WBC: 1.5 10*3/uL — ABNORMAL LOW (ref 4.0–10.5)
nRBC: 0 % (ref 0.0–0.2)

## 2020-09-22 LAB — TYPE AND SCREEN
ABO/RH(D): A POS
Antibody Screen: NEGATIVE
Unit division: 0

## 2020-09-22 LAB — BPAM RBC
Blood Product Expiration Date: 202201162359
ISSUE DATE / TIME: 202112310458
Unit Type and Rh: 6200

## 2020-09-22 MED ORDER — CIPROFLOXACIN HCL 500 MG PO TABS
500.0000 mg | ORAL_TABLET | Freq: Two times a day (BID) | ORAL | Status: DC
  Administered 2020-09-22 – 2020-09-25 (×6): 500 mg via ORAL
  Filled 2020-09-22 (×7): qty 1

## 2020-09-22 MED ORDER — PANTOPRAZOLE SODIUM 40 MG PO TBEC
40.0000 mg | DELAYED_RELEASE_TABLET | Freq: Two times a day (BID) | ORAL | Status: DC
  Administered 2020-09-22 – 2020-09-25 (×7): 40 mg via ORAL
  Filled 2020-09-22 (×7): qty 1

## 2020-09-22 NOTE — Progress Notes (Addendum)
Manufacturing engineer [name])  Notified by Transition of Care Manger of patient/family request for [facility] services at home after discharge. Chart and patient information under review by [facility] physician. Hospice eligibility confirmed.   Spoke with [name] who would first like to discuss our hospice services to her father [name] before moving forward with our services. [name] has [place] numbers and stated she will be in touch on Monday to state whether they wants [place].   Please feel free to call with any hospice related questions.  Thank you, Clementeen Hoof, BSN, [facility] liaison  [phone]

## 2020-09-22 NOTE — Progress Notes (Signed)
Daily Progress Note   Patient Name: [name]       Date: 09/22/2020 DOB: June 28, 1941  Age: 80 y.o. MRN#: [id number] Attending Physician: Erica Girt, DO Primary Care Physician: Erica Frizzle, MD Admit Date: 09/18/2020  Reason for Consultation/Follow-up: Establishing goals of care  Subjective: Patient sitting up in chair, alert. Nonverbal but looking around smiling. Spouse and daughter [name] at bedside.  Length of Stay: 3  Current Medications: Scheduled Meds:  . sodium chloride   Intravenous Once  . sodium chloride   Intravenous Once  . lactulose  20 g Oral TID  . pantoprazole  40 mg Intravenous Q12H  . rifaximin  550 mg Oral BID  . sodium chloride flush  3 mL Intravenous Q12H    Continuous Infusions: . ciprofloxacin 400 mg (09/22/20 0418)    PRN Meds: ondansetron **OR** ondansetron (ZOFRAN) IV  Physical Exam Constitutional:      General: She is not in acute distress.    Comments: Calm, smiling  Pulmonary:     Effort: Pulmonary effort is normal.  Skin:    General: Skin is warm and dry.  Neurological:     Mental Status: She is alert.     Comments: nonverbal             Vital Signs: BP (!) 121/51 (BP Location: Right Arm)   Pulse 60   Temp (!) 97.3 F (36.3 C) (Axillary)   Resp 20   Ht 5' 3"  (1.6 m)   Wt 85.7 kg   SpO2 95%   BMI 33.47 kg/m  SpO2: SpO2: 95 % O2 Device: O2 Device: Room Air O2 Flow Rate: O2 Flow Rate (L/min): 2 L/min  Intake/output summary:   Intake/Output Summary (Last 24 hours) at 09/22/2020 1054 Last data filed at 09/22/2020 1012 Gross per 24 hour  Intake 978.48 ml  Output 1400 ml  Net -421.52 ml   LBM: Last BM Date: 09/21/20 Baseline Weight: Weight: 86.2 kg Most recent weight: Weight: 85.7 kg  Palliative Assessment/Data: PPS  40%    Flowsheet Rows   Flowsheet Row Most Recent Value  Intake Tab   Referral Department Gastroenterology  Unit at Time of Referral Intermediate Care Unit  Palliative Care Primary Diagnosis Neurology  Date Notified 09/20/20  Palliative Care Type Return patient Palliative Care  Reason for referral Clarify Goals of Care  Date of Admission 09/18/20  Date first seen by Palliative Care 09/20/20  # of days Palliative referral response time 0 Day(s)  # of days IP prior to Palliative referral 2  Clinical Assessment   Palliative Performance Scale Score 20%  Psychosocial & Spiritual Assessment   Palliative Care Outcomes   Patient/Family meeting held? Yes  Who was at the meeting? spouse and daughter      Patient Active Problem List   Diagnosis Date Noted  . Hematemesis 09/19/2020  . Hyperkalemia 09/19/2020  . GI bleed 09/19/2020  . Pneumothorax on left   . Acute respiratory failure with hypoxia ([place]) 10/12/2019  . Acute lower UTI   . Pancytopenia ([place])   . Community acquired pneumonia   . Hypokalemia 11/02/2016  . Acute encephalopathy 11/02/2016  . Vasovagal syncope 11/02/2016  . Near syncope  11/02/2016  . Essential hypertension 09/11/2016  . Renal stones 05/04/2014  . Nephrolithiasis 04/06/2014  . 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 Hydronephrosis, left 02/28/2014  . E. coli pyelonephritis 02/28/2014  . Leukopenia 03/05/2012  . Varices, esophageal ([place]) 02/29/2012  . Acute upper GI bleeding 02/29/2012  . Gastric ulcer, acute with hemorrhage 02/29/2012  . Dementia without behavioral disturbance ([place]) 02/26/2012  . Cirrhosis, non-alcoholic ([place]) 70/48/8891  . Melena 02/26/2012  . Acute blood loss anemia 02/26/2012  . Diarrhea 02/26/2012  . Fracture of humerus 02/26/2012  . Ventral hernia without obstruction or gangrene 01/13/2012  . Colon cancer ([place]) 08/26/2011  . Bladder cancer ([place]) 08/26/2011  . Thrombocytopenia ([place]) 08/26/2011  . Anemia, iron deficiency  08/26/2011    Palliative Care Assessment & Plan   HPI: 80 y.o.femalewith past medical history of dementia, colon cancer, bladder cancer, NASH and cirrhosis, chronic pancytopenia, HTN, GERD, and GI bleedsadmitted on 12/28/2021with chest pain, nausea, and vomiting.Found to have left pneumothorax that resolved on repeat x ray. Developed hematemesis in ED. Had EGD with 7 bands placed. PMT consulted by [name] for goals of care discussion.  Assessment: Patient more alert and interactive today, sitting up in chair.  Spouse and daughter [name] at bedside. We reviewed conversation from yesterday. They tell me they spoke with hospice representative and they were not interested in the services offered. I offered possibility of speaking to a different hospice agency and they were open to this. Their specific concerns were how patient would be treated if she had an event unrelated to her hospice diagnosis.  Family completed MOST form with [name] - appreciative of [name] assistance. We reviewed this.    The patient and family outlined their wishes for the following treatment decisions:  Cardiopulmonary Resuscitation: Do Not Attempt Resuscitation (DNR/No CPR)  Medical Interventions: Limited Additional Interventions: Use medical treatment, IV fluids and cardiac monitoring as indicated, DO NOT USE intubation or mechanical ventilation. May consider use of less invasive airway support such as BiPAP or CPAP. Also provide comfort measures. Transfer to the hospital if indicated. Avoid intensive care.   Antibiotics: Determine use of limitation of antibiotics when infection occurs  IV Fluids: IV fluids for a defined trial period  Feeding Tube: No feeding tube     Discussed situation with [name]  Recommendations/Plan:  Reached out to alteranative hospice agency - [name] - they plan to reach out to family to further discuss their services - if family declines hospice involvement to be  setup prior to discharge, family feels they have enough support from family, hired caregivers, and patient's PCP - they know they can reach out to hospice whenever they are ready for services  No invasive work up or feeding tube  Goals of Care and Additional Recommendations:  Limitations on Scope of Treatment: No Artificial Feeding  Code Status:  DNR  Prognosis:   poor prognosis r/t liver disease, low hgb, poor PO intake, lethargy  Discharge Planning: Unclear, potentially home with hospice depending on conversations with [name] today  Care plan was discussed with patient's spouse, daughter, [name], [name] hospice liaison  Thank you for allowing the Palliative Medicine Team to assist in the care of this patient.   Total Time 35 minutes Prolonged Time Billed  no       Greater than 50%  of this time was spent counseling and coordinating care related to the above assessment and plan.  Juel Burrow, DNP, AGNP-C Palliative Medicine Team Team Phone #  [phone]  Pager [phone]

## 2020-09-22 NOTE — Progress Notes (Signed)
Progress Note    [name]  NWG:[id number] DOB: [birth year]  DOA: 09/18/2020 PCP: [name], MD    Brief Narrative:     Medical records reviewed and are as summarized below:  [name] is an 80 y.o. female with medical history significant for dementia, remote stage II colon cancer, bladder cancer, cirrhosis, chronic pancytopenia, now presenting to the emergency department for evaluation of chest pain, nausea, and vomiting.  She is accompanied by her son who assists with the history.  Patient reportedly been in her usual state of health when she was eating last night and developed acute onset of pain in her left chest and back, appeared very uncomfortable, and was noted to have some diaphoresis, nausea, and vomiting.  EMS was called, administered 324 mg of aspirin, and brought the patient into the emergency department.  Assessment/Plan:   Principal Problem:   Hematemesis Active Problems:   Thrombocytopenia ([place])   Dementia without behavioral disturbance (HCC)   Cirrhosis, non-alcoholic (HCC)   Pancytopenia (HCC)   Hyperkalemia   GI bleed     Hematemesis /large esophageal varices - Patient with cirrhosis who had esophageal varices and gastric ulcer on EGD in 2013 presents with acute-chest pain and N/V, found to have pneumothorax on left, was treated with ASA 324 mg en route, and developed hematemesis in ED  -s/p EGD: Total of seven bands were deployed out of which 6 stayed in place. Evidence of fibrin plug noted in one of the varix, where the band fell off, but was immediately treated with placement of another band for control of bleeding. -octreotide drip for a total of 72 hours -Continue Protonix PO -GI consult appreciated -change cipro to PO x 7 days -family still not quite ready for comfort care but DO NOT want any escalation of care/imaging -current plan is for home with caregivers hired by family, palliative care vs hospice-- ordered  DME  Hyperammonemia -PO lactulose as able-- can use enema if necessary -continue rifaximin (not sure she will be able to afford)  anemia- ABLA  -transfuse for <7  Pneumothorax  - Presents with acute-onset chest pain, gripping her left chest, and is found to have small left pneumothorax  - CT surgery was consulted by ED physician and indicated that if repeat imaging in am is stable she could be discharged  - resolved on repeat x ray   Pancytopenia  - Attributed to her chronic liver disease, all CBC indices similar to priors  - Following serial CBCs   Cirrhosis  - Appears compensated  - Hold diuretics while NPO  Dementia  - very confused  Hyperkalemia  - Serum potassium is 5.7 in ED  - resolved  obesity Body mass index is 33.47 kg/m.    Will work on setting up safe d/c plan with family/hospice  Family Communication/Anticipated D/C date and plan/Code Status   DVT prophylaxis: scd Code Status: dnr Family Communication: at bedside Disposition Plan: Status is: Inpatient  Remains inpatient appropriate because:Inpatient level of care appropriate due to severity of illness   Dispo: The patient is from: Home              Anticipated d/c is to: tbd              Anticipated d/c date is: 2 days              Patient currently is not medically stable to d/c.         Medical  Consultants:    GI  Palliative care  Subjective:   More awake today  Objective:    Vitals:   09/21/20 2006 09/22/20 0421 09/22/20 0809 09/22/20 1116  BP: (!) 112/52 (!) 143/94 (!) 121/51   Pulse: 63 63 60 72  Resp: 18 18 20 20   Temp: 99.1 F (37.3 C) 97.8 F (36.6 C) (!) 97.3 F (36.3 C) 98.6 F (37 C)  TempSrc: Oral Oral Axillary Oral  SpO2: 98% 95%    Weight:      Height:        Intake/Output Summary (Last 24 hours) at 09/22/2020 1235 Last data filed at 09/22/2020 1012 Gross per 24 hour  Intake 858.48 ml  Output 1000 ml  Net -141.52 ml   Filed Weights   09/18/20  2016 09/19/20 1119 09/20/20 0459  Weight: 86.2 kg 97.5 kg 85.7 kg    Exam:   General: Appearance:    Obese female in no acute distress     Lungs:     respirations unlabored  Heart:    Normal heart rate. Normal rhythm. No murmurs, rubs, or gallops.   MS:   All extremities are intact.   Neurologic:   Awake, interactive with daughter at bedside    Data Reviewed:   I have personally reviewed following labs and imaging studies:  Labs: Labs show the following:   Basic Metabolic Panel: Recent Labs  Lab 09/18/20 2102 09/18/20 2201 09/19/20 0301 09/20/20 0135 09/21/20 0118  NA 135 137 137 138 141  K 5.7* 5.7* 4.8 5.5* 4.4  CL 104 104 107 110 111  CO2 24  --  23 23 24   GLUCOSE 138* 129* 142* 135* 122*  BUN 19 29* 18 30* 30*  CREATININE 0.96 0.90 1.04* 1.19* 1.19*  CALCIUM 11.2*  --  10.6* 10.1 10.1   GFR Estimated Creatinine Clearance: 38.5 mL/min (A) (by C-G formula based on SCr of 1.19 mg/dL (H)). Liver Function Tests: Recent Labs  Lab 09/18/20 2102 09/19/20 0301 09/20/20 0135 09/21/20 0118  AST 61* 24 30 31   ALT 18 15 18 20   ALKPHOS 80 67 66 56  BILITOT 2.0* 1.4* 1.4* 1.8*  PROT 4.9* 4.4* 4.5* 4.0*  ALBUMIN 2.6* 2.2* 2.3* 2.1*   Recent Labs  Lab 09/18/20 2102  LIPASE 32   Recent Labs  Lab 09/20/20 1403  AMMONIA 103*   Coagulation profile Recent Labs  Lab 09/19/20 0301  INR 1.8*    CBC: Recent Labs  Lab 09/18/20 2102 09/18/20 2201 09/19/20 0802 09/19/20 1515 09/20/20 0135 09/21/20 0118 09/21/20 1224 09/22/20 0336  WBC 3.0*   < > 9.8 13.2* 7.3 2.3*  --  1.5*  NEUTROABS 2.3  --   --   --   --   --   --   --   HGB 9.7*   < > 9.7* 8.9* 7.6* 6.5* 8.0* 7.4*  HCT 30.3*   < > 30.1* 27.7* 22.3* 19.1* 23.2* 21.9*  MCV 100.0   < > 99.3 101.8* 99.1 99.5  --  98.2  PLT 44*   < > PLATELET CLUMPS NOTED ON SMEAR, COUNT APPEARS DECREASED 64* 41* 29*  --  30*   < > = values in this interval not displayed.   Cardiac Enzymes: No results for input(s):  CKTOTAL, CKMB, CKMBINDEX, TROPONINI in the last 168 hours. BNP (last 3 results) No results for input(s): PROBNP in the last 8760 hours. CBG: No results for input(s): GLUCAP in the last 168 hours. D-Dimer: No  results for input(s): DDIMER in the last 72 hours. Hgb A1c: No results for input(s): HGBA1C in the last 72 hours. Lipid Profile: No results for input(s): CHOL, HDL, LDLCALC, TRIG, CHOLHDL, LDLDIRECT in the last 72 hours. Thyroid function studies: No results for input(s): TSH, T4TOTAL, T3FREE, THYROIDAB in the last 72 hours.  Invalid input(s): FREET3 Anemia work up: No results for input(s): VITAMINB12, FOLATE, FERRITIN, TIBC, IRON, RETICCTPCT in the last 72 hours. Sepsis Labs: Recent Labs  Lab 09/19/20 1515 09/20/20 0135 09/21/20 0118 09/22/20 0336  WBC 13.2* 7.3 2.3* 1.5*    Microbiology Recent Results (from the past 240 hour(s))  Resp Panel by RT-PCR (Flu A&B, Covid) Nasopharyngeal Swab     Status: None   Collection Time: 09/19/20  2:24 AM   Specimen: Nasopharyngeal Swab; Nasopharyngeal(NP) swabs in vial transport medium  Result Value Ref Range Status   SARS Coronavirus 2 by RT PCR NEGATIVE NEGATIVE Final    Comment: (NOTE) SARS-CoV-2 target nucleic acids are NOT DETECTED.  The SARS-CoV-2 RNA is generally detectable in upper respiratory specimens during the acute phase of infection. The lowest concentration of SARS-CoV-2 viral copies this assay can detect is 138 copies/mL. A negative result does not preclude SARS-Cov-2 infection and should not be used as the sole basis for treatment or other patient management decisions. A negative result may occur with  improper specimen collection/handling, submission of specimen other than nasopharyngeal swab, presence of viral mutation(s) within the areas targeted by this assay, and inadequate number of viral copies(<138 copies/mL). A negative result must be combined with clinical observations, patient history, and  epidemiological information. The expected result is Negative.  Fact Sheet for Patients:  EntrepreneurPulse.com.au  Fact Sheet for Healthcare Providers:  IncredibleEmployment.be  This test is no t yet approved or cleared by the [place] FDA and  has been authorized for detection and/or diagnosis of SARS-CoV-2 by FDA under an Emergency Use Authorization (EUA). This EUA will remain  in effect (meaning this test can be used) for the duration of the COVID-19 declaration under Section 564(b)(1) of the Act, 21 U.S.C.section 360bbb-3(b)(1), unless the authorization is terminated  or revoked sooner.       Influenza A by PCR NEGATIVE NEGATIVE Final   Influenza B by PCR NEGATIVE NEGATIVE Final    Comment: (NOTE) The Xpert Xpress SARS-CoV-2/FLU/RSV plus assay is intended as an aid in the diagnosis of influenza from Nasopharyngeal swab specimens and should not be used as a sole basis for treatment. Nasal washings and aspirates are unacceptable for Xpert Xpress SARS-CoV-2/FLU/RSV testing.  Fact Sheet for Patients: EntrepreneurPulse.com.au  Fact Sheet for Healthcare Providers: IncredibleEmployment.be  This test is not yet approved or cleared by the [place] FDA and has been authorized for detection and/or diagnosis of SARS-CoV-2 by FDA under an Emergency Use Authorization (EUA). This EUA will remain in effect (meaning this test can be used) for the duration of the COVID-19 declaration under Section 564(b)(1) of the Act, 21 U.S.C. section 360bbb-3(b)(1), unless the authorization is terminated or revoked.  Performed at [facility], [place] [address] [place], [place] [address]     Procedures and diagnostic studies:  No results found.  Medications:   . sodium chloride   Intravenous Once  . sodium chloride   Intravenous Once  . lactulose  20 g Oral TID  . pantoprazole  40 mg Intravenous Q12H  .  rifaximin  550 mg Oral BID  . sodium chloride flush  3 mL Intravenous Q12H  Continuous Infusions: . ciprofloxacin 400 mg (09/22/20 0418)     LOS: 3 days   Geradine Girt  [facility]   How to contact the [facility] Attending or Consulting provider [place] or covering provider during after hours [place], for this patient?  1. Check the care team in [facility] and look for a) attending/consulting TRH provider listed and b) the [facility] team listed 2. Log into [email or web address] and use [place]'s universal password to access. If you do not have the password, please contact the hospital operator. 3. Locate the [facility] provider you are looking for under [facility] and page to a number that you can be directly reached. 4. If you still have difficulty reaching the provider, please page the [facility] (Director on Call) for the Hospitalists listed on amion for assistance.  09/22/2020, 12:35 PM

## 2020-09-23 DIAGNOSIS — F0390 Unspecified dementia without behavioral disturbance: Secondary | ICD-10-CM | POA: Diagnosis not present

## 2020-09-23 DIAGNOSIS — K920 Hematemesis: Secondary | ICD-10-CM | POA: Diagnosis not present

## 2020-09-23 DIAGNOSIS — K7460 Unspecified cirrhosis of liver: Secondary | ICD-10-CM | POA: Diagnosis not present

## 2020-09-23 LAB — CBC
HCT: 21.9 % — ABNORMAL LOW (ref 36.0–46.0)
Hemoglobin: 7.4 g/dL — ABNORMAL LOW (ref 12.0–15.0)
MCH: 33.5 pg (ref 26.0–34.0)
MCHC: 33.8 g/dL (ref 30.0–36.0)
MCV: 99.1 fL (ref 80.0–100.0)
Platelets: 36 10*3/uL — ABNORMAL LOW (ref 150–400)
RBC: 2.21 MIL/uL — ABNORMAL LOW (ref 3.87–5.11)
RDW: 17.9 % — ABNORMAL HIGH (ref 11.5–15.5)
WBC: 1.2 10*3/uL — CL (ref 4.0–10.5)
nRBC: 0 % (ref 0.0–0.2)

## 2020-09-23 LAB — BASIC METABOLIC PANEL
Anion gap: 6 (ref 5–15)
BUN: 24 mg/dL — ABNORMAL HIGH (ref 8–23)
CO2: 23 mmol/L (ref 22–32)
Calcium: 9.6 mg/dL (ref 8.9–10.3)
Chloride: 112 mmol/L — ABNORMAL HIGH (ref 98–111)
Creatinine, Ser: 1 mg/dL (ref 0.44–1.00)
GFR, Estimated: 57 mL/min — ABNORMAL LOW (ref >60)
Glucose, Bld: 125 mg/dL — ABNORMAL HIGH (ref 70–99)
Potassium: 3.6 mmol/L (ref 3.5–5.1)
Sodium: 141 mmol/L (ref 135–145)

## 2020-09-23 NOTE — Progress Notes (Signed)
Progress Note    [name]  DHR:[id number] DOB: Sep 30, 1940  DOA: 09/18/2020 PCP: [name], MD    Brief Narrative:     Medical records reviewed and are as summarized below:  [name] is an 80 y.o. female with medical history significant for dementia, remote stage II colon cancer, bladder cancer, cirrhosis, chronic pancytopenia, now presenting to the emergency department for evaluation of chest pain, nausea, and vomiting.  She is accompanied by her son who assists with the history.  Patient reportedly been in her usual state of health when she was eating last night and developed acute onset of pain in her left chest and back, appeared very uncomfortable, and was noted to have some diaphoresis, nausea, and vomiting.  EMS was called, administered 324 mg of aspirin, and brought the patient into the emergency department.  Assessment/Plan:   Principal Problem:   Hematemesis Active Problems:   Thrombocytopenia ([place])   Dementia without behavioral disturbance (HCC)   Cirrhosis, non-alcoholic (HCC)   Pancytopenia (HCC)   Hyperkalemia   GI bleed     Hematemesis /large esophageal varices - Patient with cirrhosis who had esophageal varices and gastric ulcer on EGD in 2013 presents with acute-chest pain and N/V, found to have pneumothorax on left, was treated with ASA 324 mg en route, and developed hematemesis in ED  -s/p EGD: Total of seven bands were deployed out of which 6 stayed in place. Evidence of fibrin plug noted in one of the varix, where the band fell off, but was immediately treated with placement of another band for control of bleeding. -octreotide drip for a total of 72 hours -Continue Protonix PO -GI consult appreciated -change cipro to PO x 7 days -family still not quite ready for comfort care but DO NOT want any escalation of care/imaging -current plan is for home with caregivers hired by family, palliative care vs hospice-- ordered  DME  Hyperammonemia -PO lactulose as able-- can use enema if necessary -continue rifaximin (not sure she will be able to afford)  anemia- ABLA  -transfuse for <7  Pneumothorax  - Presents with acute-onset chest pain, gripping her left chest, and is found to have small left pneumothorax  - CT surgery was consulted by ED physician and indicated that if repeat imaging in am is stable she could be discharged  - resolved on repeat x ray   Pancytopenia  - Attributed to her chronic liver disease, all CBC indices similar to priors  - Following serial CBCs   Cirrhosis  - Appears compensated  - Hold diuretics while NPO  Dementia  - very confused  Hyperkalemia  - Serum potassium is 5.7 in ED  - resolved  obesity Body mass index is 33.59 kg/m.    Will work on setting up safe d/c plan with family/hospice  Family Communication/Anticipated D/C date and plan/Code Status   DVT prophylaxis: scd Code Status: dnr Family Communication: at bedside Disposition Plan: Status is: Inpatient  Remains inpatient appropriate because:Inpatient level of care appropriate due to severity of illness   Dispo: The patient is from: Home              Anticipated d/c is to: tbd              Anticipated d/c date is: in AM-- needs hospital bed/DME              Patient currently is not medically stable to d/c.  Medical Consultants:    GI  Palliative care  Subjective:   Having BMs  Objective:    Vitals:   09/22/20 1531 09/22/20 2057 09/23/20 0323 09/23/20 1359  BP: (!) 107/56 114/60 (!) 108/45 117/61  Pulse: 75 77 (!) 56   Resp: 20 18 18 20   Temp: (!) 97.3 F (36.3 C) 98.3 F (36.8 C) 98 F (36.7 C) 97.7 F (36.5 C)  TempSrc: Oral Oral Oral Oral  SpO2:  97% 99%   Weight:   86 kg   Height:        Intake/Output Summary (Last 24 hours) at 09/23/2020 1439 Last data filed at 09/23/2020 1332 Gross per 24 hour  Intake 120 ml  Output 500 ml  Net -380 ml   Filed  Weights   09/19/20 1119 09/20/20 0459 09/23/20 0323  Weight: 97.5 kg 85.7 kg 86 kg    Exam:   General: Appearance:    Obese female  Who is chronically ill appearing     Lungs:     respirations unlabored  Heart:    Bradycardic. Normal rhythm. No murmurs, rubs, or gallops.   MS:   All extremities are intact.   Neurologic:   Awake.     Data Reviewed:   I have personally reviewed following labs and imaging studies:  Labs: Labs show the following:   Basic Metabolic Panel: Recent Labs  Lab 09/18/20 2102 09/18/20 2201 09/19/20 0301 09/20/20 0135 09/21/20 0118 09/23/20 0224  NA 135 137 137 138 141 141  K 5.7* 5.7* 4.8 5.5* 4.4 3.6  CL 104 104 107 110 111 112*  CO2 24  --  23 23 24 23   GLUCOSE 138* 129* 142* 135* 122* 125*  BUN 19 29* 18 30* 30* 24*  CREATININE 0.96 0.90 1.04* 1.19* 1.19* 1.00  CALCIUM 11.2*  --  10.6* 10.1 10.1 9.6   GFR Estimated Creatinine Clearance: 45.8 mL/min (by C-G formula based on SCr of 1 mg/dL). Liver Function Tests: Recent Labs  Lab 09/18/20 2102 09/19/20 0301 09/20/20 0135 09/21/20 0118  AST 61* 24 30 31   ALT 18 15 18 20   ALKPHOS 80 67 66 56  BILITOT 2.0* 1.4* 1.4* 1.8*  PROT 4.9* 4.4* 4.5* 4.0*  ALBUMIN 2.6* 2.2* 2.3* 2.1*   Recent Labs  Lab 09/18/20 2102  LIPASE 32   Recent Labs  Lab 09/20/20 1403  AMMONIA 103*   Coagulation profile Recent Labs  Lab 09/19/20 0301  INR 1.8*    CBC: Recent Labs  Lab 09/18/20 2102 09/18/20 2201 09/19/20 1515 09/20/20 0135 09/21/20 0118 09/21/20 1224 09/22/20 0336 09/23/20 0224  WBC 3.0*   < > 13.2* 7.3 2.3*  --  1.5* 1.2*  NEUTROABS 2.3  --   --   --   --   --   --   --   HGB 9.7*   < > 8.9* 7.6* 6.5* 8.0* 7.4* 7.4*  HCT 30.3*   < > 27.7* 22.3* 19.1* 23.2* 21.9* 21.9*  MCV 100.0   < > 101.8* 99.1 99.5  --  98.2 99.1  PLT 44*   < > 64* 41* 29*  --  30* 36*   < > = values in this interval not displayed.   Cardiac Enzymes: No results for input(s): CKTOTAL, CKMB, CKMBINDEX,  TROPONINI in the last 168 hours. BNP (last 3 results) No results for input(s): PROBNP in the last 8760 hours. CBG: No results for input(s): GLUCAP in the last 168 hours. D-Dimer: No results for  input(s): DDIMER in the last 72 hours. Hgb A1c: No results for input(s): HGBA1C in the last 72 hours. Lipid Profile: No results for input(s): CHOL, HDL, LDLCALC, TRIG, CHOLHDL, LDLDIRECT in the last 72 hours. Thyroid function studies: No results for input(s): TSH, T4TOTAL, T3FREE, THYROIDAB in the last 72 hours.  Invalid input(s): FREET3 Anemia work up: No results for input(s): VITAMINB12, FOLATE, FERRITIN, TIBC, IRON, RETICCTPCT in the last 72 hours. Sepsis Labs: Recent Labs  Lab 09/20/20 0135 09/21/20 0118 09/22/20 0336 09/23/20 0224  WBC 7.3 2.3* 1.5* 1.2*    Microbiology Recent Results (from the past 240 hour(s))  Resp Panel by RT-PCR (Flu A&B, Covid) Nasopharyngeal Swab     Status: None   Collection Time: 09/19/20  2:24 AM   Specimen: Nasopharyngeal Swab; Nasopharyngeal(NP) swabs in vial transport medium  Result Value Ref Range Status   SARS Coronavirus 2 by RT PCR NEGATIVE NEGATIVE Final    Comment: (NOTE) SARS-CoV-2 target nucleic acids are NOT DETECTED.  The SARS-CoV-2 RNA is generally detectable in upper respiratory specimens during the acute phase of infection. The lowest concentration of SARS-CoV-2 viral copies this assay can detect is 138 copies/mL. A negative result does not preclude SARS-Cov-2 infection and should not be used as the sole basis for treatment or other patient management decisions. A negative result may occur with  improper specimen collection/handling, submission of specimen other than nasopharyngeal swab, presence of viral mutation(s) within the areas targeted by this assay, and inadequate number of viral copies(<138 copies/mL). A negative result must be combined with clinical observations, patient history, and epidemiological information. The  expected result is Negative.  Fact Sheet for Patients:  EntrepreneurPulse.com.au  Fact Sheet for Healthcare Providers:  IncredibleEmployment.be  This test is no t yet approved or cleared by the [place] FDA and  has been authorized for detection and/or diagnosis of SARS-CoV-2 by FDA under an Emergency Use Authorization (EUA). This EUA will remain  in effect (meaning this test can be used) for the duration of the COVID-19 declaration under Section 564(b)(1) of the Act, 21 U.S.C.section 360bbb-3(b)(1), unless the authorization is terminated  or revoked sooner.       Influenza A by PCR NEGATIVE NEGATIVE Final   Influenza B by PCR NEGATIVE NEGATIVE Final    Comment: (NOTE) The Xpert Xpress SARS-CoV-2/FLU/RSV plus assay is intended as an aid in the diagnosis of influenza from Nasopharyngeal swab specimens and should not be used as a sole basis for treatment. Nasal washings and aspirates are unacceptable for Xpert Xpress SARS-CoV-2/FLU/RSV testing.  Fact Sheet for Patients: EntrepreneurPulse.com.au  Fact Sheet for Healthcare Providers: IncredibleEmployment.be  This test is not yet approved or cleared by the [place] FDA and has been authorized for detection and/or diagnosis of SARS-CoV-2 by FDA under an Emergency Use Authorization (EUA). This EUA will remain in effect (meaning this test can be used) for the duration of the COVID-19 declaration under Section 564(b)(1) of the Act, 21 U.S.C. section 360bbb-3(b)(1), unless the authorization is terminated or revoked.  Performed at [facility], [place] [address], [place], [place] [address]     Procedures and diagnostic studies:  No results found.  Medications:   . sodium chloride   Intravenous Once  . sodium chloride   Intravenous Once  . ciprofloxacin  500 mg Oral BID  . lactulose  20 g Oral TID  . pantoprazole  40 mg Oral BID  .  rifaximin  550 mg Oral BID  . sodium chloride flush  3 mL Intravenous Q12H   Continuous Infusions:    LOS: 4 days   Geradine Girt  [facility]   How to contact the [facility] Attending or Consulting provider [place] or covering provider during after hours [place], for this patient?  1. Check the care team in [facility] and look for a) attending/consulting TRH provider listed and b) the [facility] team listed 2. Log into [email or web address] and use [place]'s universal password to access. If you do not have the password, please contact the hospital operator. 3. Locate the [facility] provider you are looking for under [facility] and page to a number that you can be directly reached. 4. If you still have difficulty reaching the provider, please page the [facility] (Director on Call) for the Hospitalists listed on amion for assistance.  09/23/2020, 2:39 PM

## 2020-09-23 NOTE — Plan of Care (Signed)

## 2020-09-24 DIAGNOSIS — K920 Hematemesis: Secondary | ICD-10-CM | POA: Diagnosis not present

## 2020-09-24 DIAGNOSIS — F0390 Unspecified dementia without behavioral disturbance: Secondary | ICD-10-CM | POA: Diagnosis not present

## 2020-09-24 DIAGNOSIS — K7460 Unspecified cirrhosis of liver: Secondary | ICD-10-CM | POA: Diagnosis not present

## 2020-09-24 MED ORDER — LACTULOSE 10 GM/15ML PO SOLN
20.0000 g | Freq: Two times a day (BID) | ORAL | Status: DC
  Filled 2020-09-24 (×2): qty 30

## 2020-09-24 NOTE — Plan of Care (Signed)
  Problem: Safety: Goal: Ability to remain free from injury will improve Outcome: Progressing   

## 2020-09-24 NOTE — Care Management Important Message (Signed)
Important Message  Patient Details  Name: [name] MRN: [id number] Date of Birth: Jun 01, 1941   Medicare Important Message Given:  Yes     Shelda Altes 09/24/2020, 10:20 AM

## 2020-09-24 NOTE — Evaluation (Signed)
Physical Therapy Evaluation Patient Details Name: [name] MRN: [id number] DOB: March 04, 1941 Today's Date: 09/24/2020   History of Present Illness  Pt is an 80 y/o female with a PMH significant for dementia, remote stage II colon CA, bladder CA, cirrhosis, chronic pancytopenia. She presents to the ED with chest pain, nausea and vomiting, and developed hematemesis in the ED. She was found to have a small pneumothorax and large esophageal varices.    Clinical Impression  Pt admitted with above diagnosis. At the time of PT eval pt was minimally verbal - pleasant and conversant based on social cues but not able to provide any information. Husband provided history and was very attentive throughout session. Pt was able to mobilize short distance in the room with the RW and min guard assist. She required multimodal cues to complete a task but overall did not require physical assist to ambulate. She did, however require assist to perform peri-care s/p small bowel movement but per husband this is baseline for her. At the end of the session the pt was sitting comfortably in the chair with legs crossed. Husband with many questions regarding available home equipment and Hospice services. We discussed the benefits of a hospital bed and the recommendation to hold off on utilizing a Hoyer Lift at this time as the pt can still get up and mobilize without significant assistance. He was receptive to information. Pt currently with functional limitations due to the deficits listed below (see PT Problem List). Pt will benefit from skilled PT to increase their independence and safety with mobility to allow discharge to the venue listed below.       Follow Up Recommendations Home health PT;Supervision/Assistance - 24 hour    Equipment Recommendations  Hospital bed;3in1 (PT)    Recommendations for Other Services       Precautions / Restrictions Precautions Precautions: Fall Restrictions Weight Bearing  Restrictions: No      Mobility  Bed Mobility               General bed mobility comments: Pt was received sitting up on BSC.    Transfers Overall transfer level: Needs assistance Equipment used: Rolling walker (2 wheeled) Transfers: Sit to/from [facility] Sit to Stand: Min guard Stand pivot transfers: Min guard       General transfer comment: Increased time and multimodal cues to direct movement, and min guard assist for pt to complete.  Ambulation/Gait Ambulation/Gait assistance: Min guard Gait Distance (Feet): 5 Feet Assistive device: Rolling walker (2 wheeled) Gait Pattern/deviations: Shuffle;Trunk flexed;Narrow base of support Gait velocity: Decreased Gait velocity interpretation: <1.31 ft/sec, indicative of household ambulator General Gait Details: From [facility] to recliner chair. Multimodal cues for walker management but no assist required for balance.  Stairs            Wheelchair Mobility    Modified Rankin (Stroke Patients Only)       Balance Overall balance assessment: Needs assistance Sitting-balance support: Feet supported;No upper extremity supported Sitting balance-Leahy Scale: Poor Sitting balance - Comments: Pt sitting on BSC upon PT arrival and noted she was sitting with hands in lap and trunk swaying, occasionally leaning on husband for support.   Standing balance support: Bilateral upper extremity supported Standing balance-Leahy Scale: Poor Standing balance comment: Reliant on RW for support.                             Pertinent Vitals/Pain Pain Assessment: No/denies  pain    Home Living Family/patient expects to be discharged to:: Private residence Living Arrangements: Spouse/significant other Available Help at Discharge: Family;Personal care attendant;Available 24 hours/day Type of Home: House Home Access: Ramped entrance     Home Layout: Two level;Able to live on main level with  bedroom/bathroom Home Equipment: Walker - 4 wheels;Wheelchair - manual;Shower seat;Bedside commode;Grab bars - tub/shower Additional Comments: spouse provided hx    Prior Function Level of Independence: Needs assistance   Gait / Transfers Assistance Needed: Uses rollator all the time  ADL's / Homemaking Assistance Needed: assist for bathing and dressing by hired caregiver.        Hand Dominance   Dominant Hand: Left    Extremity/Trunk Assessment   Upper Extremity Assessment Upper Extremity Assessment: Generalized weakness    Lower Extremity Assessment Lower Extremity Assessment: Generalized weakness    Cervical / Trunk Assessment Cervical / Trunk Assessment: Kyphotic;Other exceptions Cervical / Trunk Exceptions: Forward head posture with rounded shoulders  Communication   Communication: No difficulties  Cognition Arousal/Alertness: Awake/alert Behavior During Therapy: Restless Overall Cognitive Status: History of cognitive impairments - at baseline                                 General Comments: Pt with baseline dementia      General Comments      Exercises     Assessment/Plan    PT Assessment Patient needs continued PT services  PT Problem List Decreased strength;Decreased range of motion;Decreased activity tolerance;Decreased balance;Decreased mobility;Decreased cognition;Decreased knowledge of use of DME;Decreased safety awareness;Decreased knowledge of precautions;Cardiopulmonary status limiting activity       PT Treatment Interventions DME instruction;Gait training;Functional mobility training;Therapeutic activities;Therapeutic exercise;Neuromuscular re-education;Patient/family education    PT Goals (Current goals can be found in the Care Plan section)  Acute Rehab PT Goals Patient Stated Goal: None stated by patient. Husband's goal is to keep her mobile as long as possible. PT Goal Formulation: Patient unable to participate in goal  setting Time For Goal Achievement: 10/08/20 Potential to Achieve Goals: Fair    Frequency Min 3X/week   Barriers to discharge        Co-evaluation               AM-PAC PT "6 Clicks" Mobility  Outcome Measure Help needed turning from your back to your side while in a flat bed without using bedrails?: None Help needed moving from lying on your back to sitting on the side of a flat bed without using bedrails?: A Little Help needed moving to and from a bed to a chair (including a wheelchair)?: A Little Help needed standing up from a chair using your arms (e.g., wheelchair or bedside chair)?: A Little Help needed to walk in hospital room?: A Little Help needed climbing 3-5 steps with a railing? : A Lot 6 Click Score: 18    End of Session Equipment Utilized During Treatment: Gait belt Activity Tolerance: Patient tolerated treatment well Patient left: in chair;with call bell/phone within reach;with family/visitor present Nurse Communication: Mobility status PT Visit Diagnosis: Unsteadiness on feet (R26.81);Muscle weakness (generalized) (M62.81);Difficulty in walking, not elsewhere classified (R26.2)    Time: 4268-3419 PT Time Calculation (min) (ACUTE ONLY): 34 min   Charges:   PT Evaluation $PT Eval Moderate Complexity: 1 Mod PT Treatments $Gait Training: 8-22 mins        Rolinda Roan, PT, DPT Acute Rehabilitation Services Pager: [phone] Office: [phone]  Thelma Comp 09/24/2020, 3:06 PM

## 2020-09-24 NOTE — TOC Progression Note (Addendum)
Transition of Care [place]) - Progression Note    Patient Details  Name: [name] MRN: [id number] Date of Birth: 24-May-1941  Transition of Care [facility]) CM/SW Contact  Graves-Bigelow, Ocie Cornfield, RN Phone Number: 09/24/2020, 4:02 PM  Clinical Narrative: Case Manager received a call from [facility] and they do not have any beds in stock. Case Manager called and spoke with daughter and she is aware that [place] is out. Daughter had no other preference regarding durable medical equipment. Case Manager called [employer] and they will work to get hospital bed, overbed table, and bedside commode ordered. Agency will discuss with husband regarding delivery time and date. Case Manager will continue to follow for additional needs   09-24-20 1651 DME will be delivered tomorrow afternoon and daughter states patient will transition home via private vehicle on 09-25-20.   Expected Discharge Plan: Home/Self Care Barriers to Discharge: Continued Medical Work up  Expected Discharge Plan and Services Expected Discharge Plan: Home/Self Care In-house Referral: Hospice / Palliative Care Discharge Planning Services: CM Consult Post Acute Care Choice: NA Living arrangements for the past 2 months: Single Family Home                 DME Arranged: Hospital bed,Bedside commode,Overbed table (Daughter wants equipment via [place]) DME Agency: [facility] (Had to change from [place] to [place]- no hospital bed available.) Date DME Agency Contacted: 09/24/20 Time DME Agency Contacted: 16 Representative spoke with at DME Agency: Freda Munro HH Arranged: Refused HH (Daughter refused home health and hospice) [place]: NA Date HH Agency Contacted: 09/21/20 Time [place]: 1613    Readmission Risk Interventions Readmission Risk Prevention Plan 09/21/2020  Transportation Screening Complete  HRI or [place] Complete  Social Work Consult for [place]  Planning/Counseling Complete  Palliative Care Screening Complete  Medication Review Press photographer) Complete  Some recent data might be hidden

## 2020-09-24 NOTE — Progress Notes (Signed)
Progress Note    [name]  RXV:[id number] DOB: 26-Feb-1941  DOA: 09/18/2020 PCP: [name], MD    Brief Narrative:     Medical records reviewed and are as summarized below:  [name] is an 80 y.o. female with medical history significant for dementia, remote stage II colon cancer, bladder cancer, cirrhosis, chronic pancytopenia, now presenting to the emergency department for evaluation of chest pain, nausea, and vomiting.  She is accompanied by her son who assists with the history.  Patient reportedly been in her usual state of health when she was eating last night and developed acute onset of pain in her left chest and back, appeared very uncomfortable, and was noted to have some diaphoresis, nausea, and vomiting.  EMS was called, administered 324 mg of aspirin, and brought the patient into the emergency department.   Assessment/Plan:   Principal Problem:   Hematemesis Active Problems:   Thrombocytopenia ([place])   Dementia without behavioral disturbance (HCC)   Cirrhosis, non-alcoholic (HCC)   Pancytopenia (HCC)   Hyperkalemia   GI bleed     Hematemesis /large esophageal varices - Patient with cirrhosis who had esophageal varices and gastric ulcer on EGD in 2013 presents with acute-chest pain and N/V, found to have pneumothorax on left, was treated with ASA 324 mg en route, and developed hematemesis in ED  -s/p EGD: Total of seven bands were deployed out of which 6 stayed in place. Evidence of fibrin plug noted in one of the varix, where the band fell off, but was immediately treated with placement of another band for control of bleeding. -octreotide drip for a total of 72 hours -Continue Protonix PO -GI consult appreciated -change cipro to PO x 7 days -family still not quite ready for comfort care but DO NOT want any escalation of care/imaging -current plan is for home with caregivers hired by family, palliative care vs hospice-- ordered  DME  Hyperammonemia -PO lactulose-titrate to 3BMs/day -continue rifaximin (not sure she will be able to afford)  anemia- ABLA  -transfuse for <7  Pneumothorax  - Presents with acute-onset chest pain, gripping her left chest, and is found to have small left pneumothorax  - CT surgery was consulted by ED physician and indicated that if repeat imaging in am is stable she could be discharged  - resolved on repeat x ray   Pancytopenia  - Attributed to her chronic liver disease, all CBC indices similar to priors  - Following serial CBCs   Cirrhosis  - Appears compensated  - Hold diuretics while NPO  Dementia  - very confused  Hyperkalemia  - Serum potassium is 5.7 in ED  - resolved  obesity Body mass index is 33.59 kg/m.    Will work on setting up safe d/c plan with family/hospice  Family Communication/Anticipated D/C date and plan/Code Status   DVT prophylaxis: scd Code Status: dnr Family Communication: at bedside Disposition Plan: Status is: Inpatient  Remains inpatient appropriate because:Inpatient level of care appropriate due to severity of illness   Dispo: The patient is from: Home              Anticipated d/c is to: tbd              Anticipated d/c date is: in AM-- needs hospital bed/DME                       Medical Consultants:    GI  Palliative care  Subjective:   Husband reports increased # of BMs but also that she is eating better  Objective:    Vitals:   09/23/20 0323 09/23/20 1359 09/23/20 2030 09/24/20 0530  BP: (!) 108/45 117/61 112/61 (!) 105/50  Pulse: (!) 56  (!) 59 (!) 57  Resp: 18 20 18 18   Temp: 98 F (36.7 C) 97.7 F (36.5 C) 98.1 F (36.7 C) 97.8 F (36.6 C)  TempSrc: Oral Oral Oral Oral  SpO2: 99%  92% 94%  Weight: 86 kg     Height:        Intake/Output Summary (Last 24 hours) at 09/24/2020 1339 Last data filed at 09/24/2020 0841 Gross per 24 hour  Intake 360 ml  Output 450 ml  Net -90 ml   Filed  Weights   09/19/20 1119 09/20/20 0459 09/23/20 0323  Weight: 97.5 kg 85.7 kg 86 kg    Exam:   General: Appearance:    Obese female in no acute distress     Lungs:      respirations unlabored  Heart:    Bradycardic. Normal rhythm. No murmurs, rubs, or gallops.   MS:   All extremities are intact.   Neurologic:   Awake, alert, interacts more with family but will say a few words    Data Reviewed:   I have personally reviewed following labs and imaging studies:  Labs: Labs show the following:   Basic Metabolic Panel: Recent Labs  Lab 09/18/20 2102 09/18/20 2201 09/19/20 0301 09/20/20 0135 09/21/20 0118 09/23/20 0224  NA 135 137 137 138 141 141  K 5.7* 5.7* 4.8 5.5* 4.4 3.6  CL 104 104 107 110 111 112*  CO2 24  --  23 23 24 23   GLUCOSE 138* 129* 142* 135* 122* 125*  BUN 19 29* 18 30* 30* 24*  CREATININE 0.96 0.90 1.04* 1.19* 1.19* 1.00  CALCIUM 11.2*  --  10.6* 10.1 10.1 9.6   GFR Estimated Creatinine Clearance: 45.8 mL/min (by C-G formula based on SCr of 1 mg/dL). Liver Function Tests: Recent Labs  Lab 09/18/20 2102 09/19/20 0301 09/20/20 0135 09/21/20 0118  AST 61* 24 30 31   ALT 18 15 18 20   ALKPHOS 80 67 66 56  BILITOT 2.0* 1.4* 1.4* 1.8*  PROT 4.9* 4.4* 4.5* 4.0*  ALBUMIN 2.6* 2.2* 2.3* 2.1*   Recent Labs  Lab 09/18/20 2102  LIPASE 32   Recent Labs  Lab 09/20/20 1403  AMMONIA 103*   Coagulation profile Recent Labs  Lab 09/19/20 0301  INR 1.8*    CBC: Recent Labs  Lab 09/18/20 2102 09/18/20 2201 09/19/20 1515 09/20/20 0135 09/21/20 0118 09/21/20 1224 09/22/20 0336 09/23/20 0224  WBC 3.0*   < > 13.2* 7.3 2.3*  --  1.5* 1.2*  NEUTROABS 2.3  --   --   --   --   --   --   --   HGB 9.7*   < > 8.9* 7.6* 6.5* 8.0* 7.4* 7.4*  HCT 30.3*   < > 27.7* 22.3* 19.1* 23.2* 21.9* 21.9*  MCV 100.0   < > 101.8* 99.1 99.5  --  98.2 99.1  PLT 44*   < > 64* 41* 29*  --  30* 36*   < > = values in this interval not displayed.   Cardiac Enzymes: No  results for input(s): CKTOTAL, CKMB, CKMBINDEX, TROPONINI in the last 168 hours. BNP (last 3 results) No results for input(s): PROBNP in the last 8760 hours. CBG: No results for  input(s): GLUCAP in the last 168 hours. D-Dimer: No results for input(s): DDIMER in the last 72 hours. Hgb A1c: No results for input(s): HGBA1C in the last 72 hours. Lipid Profile: No results for input(s): CHOL, HDL, LDLCALC, TRIG, CHOLHDL, LDLDIRECT in the last 72 hours. Thyroid function studies: No results for input(s): TSH, T4TOTAL, T3FREE, THYROIDAB in the last 72 hours.  Invalid input(s): FREET3 Anemia work up: No results for input(s): VITAMINB12, FOLATE, FERRITIN, TIBC, IRON, RETICCTPCT in the last 72 hours. Sepsis Labs: Recent Labs  Lab 09/20/20 0135 09/21/20 0118 09/22/20 0336 09/23/20 0224  WBC 7.3 2.3* 1.5* 1.2*    Microbiology Recent Results (from the past 240 hour(s))  Resp Panel by RT-PCR (Flu A&B, Covid) Nasopharyngeal Swab     Status: None   Collection Time: 09/19/20  2:24 AM   Specimen: Nasopharyngeal Swab; Nasopharyngeal(NP) swabs in vial transport medium  Result Value Ref Range Status   SARS Coronavirus 2 by RT PCR NEGATIVE NEGATIVE Final    Comment: (NOTE) SARS-CoV-2 target nucleic acids are NOT DETECTED.  The SARS-CoV-2 RNA is generally detectable in upper respiratory specimens during the acute phase of infection. The lowest concentration of SARS-CoV-2 viral copies this assay can detect is 138 copies/mL. A negative result does not preclude SARS-Cov-2 infection and should not be used as the sole basis for treatment or other patient management decisions. A negative result may occur with  improper specimen collection/handling, submission of specimen other than nasopharyngeal swab, presence of viral mutation(s) within the areas targeted by this assay, and inadequate number of viral copies(<138 copies/mL). A negative result must be combined with clinical observations, patient  history, and epidemiological information. The expected result is Negative.  Fact Sheet for Patients:  EntrepreneurPulse.com.au  Fact Sheet for Healthcare Providers:  IncredibleEmployment.be  This test is no t yet approved or cleared by the [place] FDA and  has been authorized for detection and/or diagnosis of SARS-CoV-2 by FDA under an Emergency Use Authorization (EUA). This EUA will remain  in effect (meaning this test can be used) for the duration of the COVID-19 declaration under Section 564(b)(1) of the Act, 21 U.S.C.section 360bbb-3(b)(1), unless the authorization is terminated  or revoked sooner.       Influenza A by PCR NEGATIVE NEGATIVE Final   Influenza B by PCR NEGATIVE NEGATIVE Final    Comment: (NOTE) The Xpert Xpress SARS-CoV-2/FLU/RSV plus assay is intended as an aid in the diagnosis of influenza from Nasopharyngeal swab specimens and should not be used as a sole basis for treatment. Nasal washings and aspirates are unacceptable for Xpert Xpress SARS-CoV-2/FLU/RSV testing.  Fact Sheet for Patients: EntrepreneurPulse.com.au  Fact Sheet for Healthcare Providers: IncredibleEmployment.be  This test is not yet approved or cleared by the [place] FDA and has been authorized for detection and/or diagnosis of SARS-CoV-2 by FDA under an Emergency Use Authorization (EUA). This EUA will remain in effect (meaning this test can be used) for the duration of the COVID-19 declaration under Section 564(b)(1) of the Act, 21 U.S.C. section 360bbb-3(b)(1), unless the authorization is terminated or revoked.  Performed at [facility], [place] [address], [place], [place] [address]     Procedures and diagnostic studies:  No results found.  Medications:   . sodium chloride   Intravenous Once  . sodium chloride   Intravenous Once  . ciprofloxacin  500 mg Oral BID  . lactulose  20 g Oral  BID  . pantoprazole  40 mg Oral BID  . rifaximin  550 mg Oral BID  . sodium chloride flush  3 mL Intravenous Q12H   Continuous Infusions:    LOS: 5 days   Geradine Girt  [facility]   How to contact the [facility] Attending or Consulting provider [place] or covering provider during after hours [place], for this patient?  1. Check the care team in [facility] and look for a) attending/consulting TR[facility] provider listed and b) the [facility] team listed 2. Log into [email or web address] and use [place]'s universal password to access. If you do not have the password, please contact the hospital operator. 3. Locate the [facility] provider you are looking for under [facility] and page to a number that you can be directly reached. 4. If you still have difficulty reaching the provider, please page the [facility] (Director on Call) for the Hospitalists listed on amion for assistance.  09/24/2020, 1:39 PM

## 2020-09-24 NOTE — Care Management (Signed)
1310 09-24-20 Benefits check submitted for Rifaximin. Case Manager will follow for cost. Graves-Bigelow, Ocie Cornfield, RN,BSN Case Manager

## 2020-09-24 NOTE — TOC Progression Note (Signed)
Transition of Care [facility]) - Progression Note    Patient Details  Name: [name] MRN: [id number] Date of Birth: 06/03/1941  Transition of Care [facility]) CM/SW Contact  Graves-Bigelow, Ocie Cornfield, RN Phone Number: 09/24/2020, 2:49 PM  Clinical Narrative: Case Manager was able to speak with daughter [name] and daughter feels like the patient will not need hospice services at this time. [name] feels that family needs to have further discussion regards goals for the patient. Daughter has spoken with [facility] and [facility]. Case Manager did call both agencies to make them aware that the patient will not need services at this time. Daughter wants durable medical equipment ordered; Hospital bed, Bedside commode, and Overbed table via [employer]. Daughter is aware that there may be a delay with the bed delivery. Patient has a bed that she can use until the hospital bed arrives. Case Manager spoke with Theadora Rama at [facility] and orders were faxed to [phone]. Case Manager will continue to follow for additional transition of care needs.    Expected Discharge Plan: Home/Self Care Barriers to Discharge: Continued Medical Work up  Expected Discharge Plan and Services Expected Discharge Plan: Home/Self Care In-house Referral: Hospice / Palliative Care Discharge Planning Services: CM Consult Post Acute Care Choice: NA Living arrangements for the past 2 months: Single Family Home                 DME Arranged: Hospital bed,Bedside commode,Overbed table (Daughter wants equipment via [place]) DME Agency: [facility] Date DME Agency Contacted: 09/24/20 Time DME Agency Contacted: [phone] Representative spoke with at DME Agency: Theadora Rama HH Arranged: Refused HH (Daughter refused home health and hospice) [place]: NA Date HH Agency Contacted: 09/21/20 Time Reserve: 1613    Readmission Risk Interventions Readmission Risk Prevention  Plan 09/21/2020  Transportation Screening Complete  HRI or [name] Complete  Social Work Consult for [place] Planning/Counseling Complete  Palliative Care Screening Complete  Medication Review Press photographer) Complete  Some recent data might be hidden

## 2020-09-24 NOTE — Care Management (Signed)
    Durable Medical Equipment  (From admission, onward)         Start     Ordered   09/24/20 1544  For home use only DME Hospital bed  Once       Question Answer Comment  Length of Need Lifetime   Patient has (list medical condition): weakness, pneumothorax, hemataemesis, esophageal varicies.   The above medical condition requires: Patient requires the ability to reposition frequently   Head must be elevated greater than: 30 degrees   Bed type Semi-electric   Support Surface: Gel Overlay      09/24/20 1554   09/24/20 1304  For home use only DME Other see comment  Once       Comments: Over bed table  Question:  Length of Need  Answer:  Lifetime   09/24/20 1303   09/22/20 0858  For home use only DME 3 n 1  Once        09/22/20 9787

## 2020-09-24 NOTE — TOC Benefit Eligibility Note (Signed)
Transition of Care [facility]) Benefit Eligibility Note    Patient Details  Name: [name] MRN: [id number] Date of Birth: 1940-12-07   Medication/Dose: RIFAXIMIN ( XIFAXAN )  550 MG BID  Covered?: Yes  Tier:  (TIER-5 DRUG)  Prescription Coverage Preferred Pharmacy: [facility]  and Roseanne Kaufman with Person/Company/Phone Number:: JOY  @  [employer] # [phone]  Co-Pay: 27 % OF TOTAL COST  Prior Approval: Yes [phone])  Deductible: Unmet       Memory Argue Phone Number: 09/24/2020, 2:25 PM

## 2020-09-25 DIAGNOSIS — D61818 Other pancytopenia: Secondary | ICD-10-CM

## 2020-09-25 DIAGNOSIS — F0390 Unspecified dementia without behavioral disturbance: Secondary | ICD-10-CM | POA: Diagnosis not present

## 2020-09-25 DIAGNOSIS — K920 Hematemesis: Secondary | ICD-10-CM | POA: Diagnosis not present

## 2020-09-25 MED ORDER — LACTULOSE 10 GM/15ML PO SOLN: 20.0000 g | Freq: Three times a day (TID) | ORAL | 0 refills | Status: AC

## 2020-09-25 MED ORDER — PANTOPRAZOLE SODIUM 40 MG PO TBEC: 40.0000 mg | DELAYED_RELEASE_TABLET | Freq: Two times a day (BID) | ORAL | 0 refills | Status: DC

## 2020-09-25 NOTE — Progress Notes (Signed)
Discharge instructions (including medications) discussed with and copy provided to patient/caregiver 

## 2020-09-25 NOTE — Plan of Care (Signed)
  Problem: Education: Goal: Knowledge of General Education information will improve Description: Including pain rating scale, medication(s)/side effects and non-pharmacologic comfort measures Outcome: Adequate for Discharge   

## 2020-09-25 NOTE — Discharge Summary (Signed)
Physician Discharge Summary  [name] QMG:[id number] DOB: [birth year] DOA: 09/18/2020  PCP: [name], MD  Admit date: 09/18/2020 Discharge date: 09/25/2020  Admitted From: home Discharge disposition: Home   Recommendations for Outpatient Follow-Up:   1. Outpatient palliative care referral 2. Transition to hospice when family ready as recommended by GI 3. Follow BMP and resume diuretics when patient eating better   Discharge Diagnosis:   Principal Problem:   Hematemesis Active Problems:   Thrombocytopenia ([place])   Dementia without behavioral disturbance (HCC)   Cirrhosis, non-alcoholic (HCC)   Pancytopenia (HCC)   Hyperkalemia   GI bleed    Discharge Condition: Improved.  Diet recommendation: Soft  Wound care: None.  Code status: DNR   History of Present Illness:   [name] is a 80 y.o. female with medical history significant for dementia, remote stage II colon cancer, bladder cancer, cirrhosis, chronic pancytopenia, now presenting to the emergency department for evaluation of chest pain, nausea, and vomiting.  She is accompanied by her son who assists with the history.  Patient reportedly been in her usual state of health when she was eating last night and developed acute onset of pain in her left chest and back, appeared very uncomfortable, and was noted to have some diaphoresis, nausea, and vomiting.  EMS was called, administered 324 mg of aspirin, and brought the patient into the emergency department.    Hospital Course by Problem:   Hematemesis/large esophageal varices -Patient with cirrhosis who had esophageal varices and gastric ulcer on EGD in 2013 presents with acute-chest pain and N/V, found to have pneumothorax on left, was treated with ASA 324 mg en route, and developed hematemesis in ED -s/p EGD: Total of seven bands were deployed out of which 6 stayed in place. Evidence of fibrin plug noted in one of the varix,where  the band fell off,but was immediately treated with placement of another band for control of bleeding. -Continue Protonix PO -s/p cipro -family still not quite ready for comfort care but DO NOT want any escalation of care/imaging -current plan is for home with caregivers hired by family, palliative care - ordered DME  Hyperammonemia -PO lactulose-titrate to 3BMs/day   anemia- ABLA  -transfuse for <7  Pneumothorax -Presents with acute-onset chest pain, gripping her left chest, and is found to have small left pneumothorax -CT surgery was consulted by ED physician and indicated that if repeat imaging in am is stable she could be discharged -resolved on repeat x ray  Pancytopenia -Attributed to her chronic liver disease, all CBC indices similar to priors -Outpatient follow-up  Cirrhosis -Appears compensated -Resume diuretics when intake improved  Dementia -very confused but appears to be at baseline  Hyperkalemia  - Serum potassium is 5.7 in ED  - resolved  obesity Body mass index is 33.59 kg/m.     Medical Consultants:   GI   Discharge Exam:   Vitals:   09/23/20 2030 09/24/20 0530  BP: 112/61 (!) 105/50  Pulse: (!) 59 (!) 57  Resp: 18 18  Temp: 98.1 F (36.7 C) 97.8 F (36.6 C)  SpO2: 92% 94%   Vitals:   09/23/20 0323 09/23/20 1359 09/23/20 2030 09/24/20 0530  BP: (!) 108/45 117/61 112/61 (!) 105/50  Pulse: (!) 56  (!) 59 (!) 57  Resp: 18 20 18 18   Temp: 98 F (36.7 C) 97.7 F (36.5 C) 98.1 F (36.7 C) 97.8 F (36.6 C)  TempSrc: Oral Oral Oral Oral  SpO2: 99%  92% 94%  Weight: 86 kg     Height:        General exam: Appears calm and comfortable.  Family at bedside anxious to take patient home   The results of significant diagnostics from this hospitalization (including imaging, microbiology, ancillary and laboratory) are listed below for reference.     Procedures and Diagnostic Studies:   DG Chest 2 View  Result  Date: 09/19/2020 CLINICAL DATA:  80 year old female with small left pneumothorax on chest CTA 09/18/2020. EXAM: CHEST - 2 VIEW COMPARISON:  Portable chest 09/19/2020 and earlier. FINDINGS: Semi upright AP and lateral views of the chest. Stable lung volumes. Stable mediastinal contours with tortuous thoracic aorta. Visualized tracheal air column is within normal limits. No pulmonary edema. No evidence of pleural effusion. No progression of the trace left lung base pneumothorax visible by CTA but not evident on subsequent radiographs. No acute osseous abnormality identified. Paucity of bowel gas in the upper abdomen. IMPRESSION: 1. No progression of the trace left lung base pneumothorax, visible on recent CTA but not evident on subsequent radiographs. 2. No new cardiopulmonary abnormality. Electronically Signed   By: Genevie Ann M.D.   On: 09/19/2020 06:36   DG Chest Port 1 View  Result Date: 09/19/2020 CLINICAL DATA:  Chest pain EXAM: PORTABLE CHEST 1 VIEW COMPARISON:  September 18, 2020 FINDINGS: The heart size and mediastinal contours are mildly enlarged. Aortic knob calcifications are seen. Both lungs are clear. The visualized skeletal structures are unremarkable. IMPRESSION: No active disease. Electronically Signed   By: Prudencio Pair M.D.   On: 09/19/2020 02:10   DG Chest Port 1 View  Result Date: 09/18/2020 CLINICAL DATA:  Chest pain. EXAM: PORTABLE CHEST 1 VIEW COMPARISON:  08/29/2020 FINDINGS: Cardiomegaly is not significantly changed. Stable aortic atherosclerosis and tortuosity. Minimal subpleural scarring in both lung bases. No acute airspace disease. No pleural fluid or pneumothorax. Bones are diffusely under mineralized. Chronic deformity of left proximal humerus. IMPRESSION: Stable cardiomegaly.  Bibasilar scarring.  No acute abnormality. Aortic Atherosclerosis (ICD10-I70.0). Electronically Signed   By: Keith Rake M.D.   On: 09/18/2020 21:22   CT Angio Chest/Abd/Pel for Dissection W and/or  Wo Contrast  Result Date: 09/19/2020 CLINICAL DATA:  Abdominal pain, question dissection EXAM: CT ANGIOGRAPHY CHEST, ABDOMEN AND PELVIS TECHNIQUE: Non-contrast CT of the chest was initially obtained. Multidetector CT imaging through the chest, abdomen and pelvis was performed using the standard protocol during bolus administration of intravenous contrast. Multiplanar reconstructed images and MIPs were obtained and reviewed to evaluate the vascular anatomy. CONTRAST:  142m OMNIPAQUE IOHEXOL 350 MG/ML SOLN COMPARISON:  None. FINDINGS: CTA CHEST FINDINGS Cardiovascular: --Heart: The heart size is normal.  There is nopericardial effusion. --Aorta: The course and caliber of the thoracic aorta are normal. There is scattered aortic atherosclerotic calcification. Precontrast images show no aortic intramural hematoma. There is no blood pool, dissection or penetrating ulcer demonstrated on arterial phase postcontrast imaging. There is a conventional 3 vessel aortic arch branching pattern. The proximal arch vessels are widely patent. --Pulmonary Arteries: Contrast timing is optimized for preferential opacification of the aorta. Within that limitation, normal central pulmonary arteries. Mediastinum/Nodes: No mediastinal, hilar or axillary lymphadenopathy. The visualized thyroid and thoracic esophageal course are unremarkable. Lungs/Pleura: There is a small left basilar pneumothorax present. No mediastinal shift. No pleural effusion. Musculoskeletal: No chest wall abnormality. No acute osseous findings. Review of the MIP images confirms the above findings. CTA ABDOMEN AND PELVIS FINDINGS VASCULAR Aorta: Normal caliber  aorta without aneurysm, dissection, vasculitis or hemodynamically significant stenosis. There is scattered aortic atherosclerosis. Celiac: No aneurysm, dissection or hemodynamically significant stenosis. Normal branching pattern SMA: There is mild stenosis at the origin of the SMA due to atherosclerosis.  Renals: Single renal arteries bilaterally. No aneurysm, dissection, stenosis or evidence of fibromuscular dysplasia. IMA: Patent without abnormality. Inflow: No aneurysm, stenosis or dissection. Veins: Normal course and caliber of the major veins. Assessment is otherwise limited by the arterial dominant contrast phase. Review of the MIP images confirms the above findings. NON-VASCULAR Hepatobiliary: There is a slightly shrunken nodular liver contour seen throughout. The patient is status post cholecystectomy. Again noted is a tiny 5 mm hypodense lesion in the posterior right liver lobe. 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 Pancreas: Normal contours without ductal dilatation. No peripancreatic fluid collection. Spleen: Normal arterial phase splenic enhancement pattern. There is mild splenomegaly measuring 14 cm in craniocaudad dimension. Adrenals/Urinary Tract: --Adrenal glands: Normal. Kidneys: A small amount of air seen within the bilateral renal pelvis which could be due to recent catheter. No hydronephrosis or collecting system calculi. 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 Stomach/Bowel: --Stomach/Duodenum: A small hiatal hernia is present. There appears to be question of fat stranding changes seen at the gastroduodenal junction. --Small bowel: No dilatation or inflammation. --Colon: Scattered colonic diverticula are noted. The patient is status post cholecystectomy. A large amount of rectal colonic stool is present. Lymphatic: No abdominal or pelvic lymphadenopathy. A small amount abdominopelvic ascites is present. Reproductive: No free fluid in the pelvis. Musculoskeletal. No bony spinal canal stenosis or focal osseous abnormality. There is diffuse osteopenia. Other: None. Review of the MIP images confirms the  above findings. IMPRESSION: 1. No acute aortic abnormality. 2. Small left basilar pneumothorax. 3. Small amount of abdominopelvic ascites. 4. Findings which may be suggestive of gastritis at the gastroduodenal junction. 5. Findings of cirrhosis and portal hypertension 6. Chronic portal vein/splenic confluence thrombosis. 7.  Aortic Atherosclerosis (ICD10-I70.0). 8. These results were called by telephone at the time of interpretation on 09/19/2020 at 12:06 am to provider DAN FLOYD , who verbally acknowledged these results. Electronically Signed   By: Prudencio Pair M.D.   On: 09/19/2020 00:05     Labs:   Basic Metabolic Panel: Recent Labs  Lab 09/18/20 2102 09/18/20 2201 09/19/20 0301 09/20/20 0135 09/21/20 0118 09/23/20 0224  NA 135 137 137 138 141 141  K 5.7* 5.7* 4.8 5.5* 4.4 3.6  CL 104 104 107 110 111 112*  CO2 24  --  23 23 24 23   GLUCOSE 138* 129* 142* 135* 122* 125*  BUN 19 29* 18 30* 30* 24*  CREATININE 0.96 0.90 1.04* 1.19* 1.19* 1.00  CALCIUM 11.2*  --  10.6* 10.1 10.1 9.6   GFR Estimated Creatinine Clearance: 45.8 mL/min (by C-G formula based on SCr of 1 mg/dL). Liver Function Tests: Recent Labs  Lab 09/18/20 2102 09/19/20 0301 09/20/20 0135 09/21/20 0118  AST 61* 24 30 31   ALT 18 15 18 20   ALKPHOS 80 67 66 56  BILITOT 2.0* 1.4* 1.4* 1.8*  PROT 4.9* 4.4* 4.5* 4.0*  ALBUMIN 2.6* 2.2* 2.3* 2.1*   Recent Labs  Lab 09/18/20 2102  LIPASE 32   Recent Labs  Lab 09/20/20 1403  AMMONIA 103*   Coagulation profile Recent Labs  Lab 09/19/20  0301  INR 1.8*    CBC: Recent Labs  Lab 09/18/20 2102 09/18/20 2201 09/19/20 1515 09/20/20 0135 09/21/20 0118 09/21/20 1224 09/22/20 0336 09/23/20 0224  WBC 3.0*   < > 13.2* 7.3 2.3*  --  1.5* 1.2*  NEUTROABS 2.3  --   --   --   --   --   --   --   HGB 9.7*   < > 8.9* 7.6* 6.5* 8.0* 7.4* 7.4*  HCT 30.3*   < > 27.7* 22.3* 19.1* 23.2* 21.9* 21.9*  MCV 100.0   < > 101.8* 99.1 99.5  --  98.2 99.1  PLT 44*   < > 64*  41* 29*  --  30* 36*   < > = values in this interval not displayed.   Cardiac Enzymes: No results for input(s): CKTOTAL, CKMB, CKMBINDEX, TROPONINI in the last 168 hours. BNP: Invalid input(s): POCBNP CBG: No results for input(s): GLUCAP in the last 168 hours. D-Dimer No results for input(s): DDIMER in the last 72 hours. Hgb A1c No results for input(s): HGBA1C in the last 72 hours. Lipid Profile No results for input(s): CHOL, HDL, LDLCALC, TRIG, CHOLHDL, LDLDIRECT in the last 72 hours. Thyroid function studies No results for input(s): TSH, T4TOTAL, T3FREE, THYROIDAB in the last 72 hours.  Invalid input(s): FREET3 Anemia work up No results for input(s): VITAMINB12, FOLATE, FERRITIN, TIBC, IRON, RETICCTPCT in the last 72 hours. Microbiology Recent Results (from the past 240 hour(s))  Resp Panel by RT-PCR (Flu A&B, Covid) Nasopharyngeal Swab     Status: None   Collection Time: 09/19/20  2:24 AM   Specimen: Nasopharyngeal Swab; Nasopharyngeal(NP) swabs in vial transport medium  Result Value Ref Range Status   SARS Coronavirus 2 by RT PCR NEGATIVE NEGATIVE Final    Comment: (NOTE) SARS-CoV-2 target nucleic acids are NOT DETECTED.  The SARS-CoV-2 RNA is generally detectable in upper respiratory specimens during the acute phase of infection. The lowest concentration of SARS-CoV-2 viral copies this assay can detect is 138 copies/mL. A negative result does not preclude SARS-Cov-2 infection and should not be used as the sole basis for treatment or other patient management decisions. A negative result may occur with  improper specimen collection/handling, submission of specimen other than nasopharyngeal swab, presence of viral mutation(s) within the areas targeted by this assay, and inadequate number of viral copies(<138 copies/mL). A negative result must be combined with clinical observations, patient history, and epidemiological information. The expected result is Negative.  Fact  Sheet for Patients:  EntrepreneurPulse.com.au  Fact Sheet for Healthcare Providers:  IncredibleEmployment.be  This test is no t yet approved or cleared by the [place] FDA and  has been authorized for detection and/or diagnosis of SARS-CoV-2 by FDA under an Emergency Use Authorization (EUA). This EUA will remain  in effect (meaning this test can be used) for the duration of the COVID-19 declaration under Section 564(b)(1) of the Act, 21 U.S.C.section 360bbb-3(b)(1), unless the authorization is terminated  or revoked sooner.       Influenza A by PCR NEGATIVE NEGATIVE Final   Influenza B by PCR NEGATIVE NEGATIVE Final    Comment: (NOTE) The Xpert Xpress SARS-CoV-2/FLU/RSV plus assay is intended as an aid in the diagnosis of influenza from Nasopharyngeal swab specimens and should not be used as a sole basis for treatment. Nasal washings and aspirates are unacceptable for Xpert Xpress SARS-CoV-2/FLU/RSV testing.  Fact Sheet for Patients: EntrepreneurPulse.com.au  Fact Sheet for Healthcare Providers: IncredibleEmployment.be  This test is  not yet approved or cleared by the [place] and has been authorized for detection and/or diagnosis of SARS-CoV-2 by FDA under an Emergency Use Authorization (EUA). This EUA will remain in effect (meaning this test can be used) for the duration of the COVID-19 declaration under Section 564(b)(1) of the Act, 21 U.S.C. section 360bbb-3(b)(1), unless the authorization is terminated or revoked.  Performed at [facility], [place] [address] [place], [place] [address]      Discharge Instructions:   Discharge Instructions    Discharge instructions   Complete by: As directed    Soft diet Titrate lactulose to 2-3 BMs/day to rid body of ammonia   Increase activity slowly   Complete by: As directed      Allergies as of 09/25/2020      Reactions   Bee Venom  Anaphylaxis   Codeine Anaphylaxis, Rash, Shortness Of Breath   Morphine And Related Anaphylaxis, Rash   Nutritional Supplements Anaphylaxis   Phytonadione Other (See Comments)   Pt experienced an episode of cyanosis following dose of Vitamin K 10 mg IV.   Ceftin [cefuroxime] Rash   Penicillins Rash, Other (See Comments)   Has patient had a PCN reaction causing immediate rash, facial/tongue/throat swelling, SOB or lightheadedness with hypotension: No Has patient had a PCN reaction causing severe rash involving mucus membranes or skin necrosis: No Has patient had a PCN reaction that required hospitalization: No Has patient had a PCN reaction occurring within the last 10 years: No If all of the above answers are "NO", then may proceed with Cephalosporin use.   Promethazine Hcl Other (See Comments)   Reaction:  Unknown       Medication List    STOP taking these medications   doxycycline 100 MG capsule Commonly known as: VIBRAMYCIN   losartan 25 MG tablet Commonly known as: COZAAR   spironolactone 50 MG tablet Commonly known as: ALDACTONE     TAKE these medications   acidophilus Caps capsule Take 1 capsule by mouth daily.   Apoaequorin 20 MG Caps Take 20 mg by mouth daily.   cholecalciferol 1000 units tablet Commonly known as: VITAMIN D Take 1,000 Units by mouth daily.   citalopram 40 MG tablet Commonly known as: CELEXA TAKE 1 TABLET BY MOUTH EVERY DAY   Ginkgo Biloba 60 MG Tabs Take 60 mg by mouth daily.   lactulose 10 GM/15ML solution Commonly known as: CHRONULAC Take 30 mLs (20 g total) by mouth 3 (three) times daily. Titrate to 2-3 large BMs/day What changed: See the new instructions.   magic mouthwash w/lidocaine Soln Take 5 mLs by mouth 3 (three) times daily as needed for mouth pain.   multivitamin with minerals Tabs tablet Take 1 tablet by mouth daily.   pantoprazole 40 MG tablet Commonly known as: PROTONIX Take 1 tablet (40 mg total) by mouth 2 (two)  times daily. What changed: See the new instructions.   vitamin B-12 1000 MCG tablet Commonly known as: CYANOCOBALAMIN Take 1,000 mcg by mouth daily.   vitamin C 1000 MG tablet Take 1,000 mg by mouth daily.            Durable Medical Equipment  (From admission, onward)         Start     Ordered   09/24/20 1544  For home use only DME Hospital bed  Once       Question Answer Comment  Length of Need Lifetime   Patient has (list medical condition): weakness,  pneumothorax, hemataemesis, esophageal varicies.   The above medical condition requires: Patient requires the ability to reposition frequently   Head must be elevated greater than: 30 degrees   Bed type Semi-electric   Support Surface: Gel Overlay      09/24/20 1554   09/24/20 1304  For home use only DME Other see comment  Once       Comments: Over bed table  Question:  Length of Need  Answer:  Lifetime   09/24/20 1303   09/22/20 0858  For home use only DME 3 n 1  Once        09/22/20 0858          Follow-up Information    Schedule an appointment as soon as possible for a visit with [name], MD.   Specialty: Family Medicine Why: recommend ambulatory palliative care referral Contact information: [address] [place] [address] [phone]                Time coordinating discharge: 35  Signed:  Geradine Girt DO  [facility] 09/25/2020, 9:26 AM

## 2020-09-25 NOTE — TOC Progression Note (Signed)
Transition of Care [facility]) - Progression Note    Patient Details  Name: [name] MRN: [id number] Date of Birth: [birth year]  [facility] CM/SW Contact  Zenon Mayo, RN Phone Number: 09/25/2020, 12:34 PM  Clinical Narrative:    NCM see that patient has been discharged, NCM contacted daughter to see if DME has been delivered, she states they will be there after 2 pm with the DME but patient is sitting in the chair and she is fine, she thanked this NCM for checking on them.    Expected Discharge Plan: Home/Self Care Barriers to Discharge: Continued Medical Work up  Expected Discharge Plan and Services Expected Discharge Plan: Home/Self Care In-house Referral: Hospice / Palliative Care Discharge Planning Services: CM Consult Post Acute Care Choice: NA Living arrangements for the past 2 months: Single Family Home Expected Discharge Date: 09/25/20               DME Arranged: Hospital bed,Bedside commode,Overbed table (Daughter wants equipment via [place]) DME Agency: [facility] (Had to change from [place] to [place]- no hospital bed available.) Date DME Agency Contacted: 09/24/20 Time DME Agency Contacted: 86 Representative spoke with at DME Agency: Freda Munro HH Arranged: Refused HH (Daughter refused home health and hospice) [place]: NA Date HH Agency Contacted: 09/21/20 Time [place]: [phone]     Social Determinants of Health (SDOH) Interventions    Readmission Risk Interventions Readmission Risk Prevention Plan 09/21/2020  Transportation Screening Complete  HRI or [place] Complete  Social Work Consult for [place] Planning/Counseling Complete  Palliative Care Screening Complete  Medication Review Press photographer) Complete  Some recent data might be hidden

## 2020-09-28 ENCOUNTER — Other Ambulatory Visit: Payer: Self-pay

## 2020-09-28 ENCOUNTER — Ambulatory Visit (INDEPENDENT_AMBULATORY_CARE_PROVIDER_SITE_OTHER): Payer: Medicare Other | Admitting: Family Medicine

## 2020-09-28 ENCOUNTER — Encounter: Payer: Self-pay | Admitting: Family Medicine

## 2020-09-28 VITALS — BP 110/62 | HR 67 | Temp 98.7°F | Ht 63.0 in

## 2020-09-28 DIAGNOSIS — K922 Gastrointestinal hemorrhage, unspecified: Secondary | ICD-10-CM

## 2020-09-28 DIAGNOSIS — D61818 Other pancytopenia: Secondary | ICD-10-CM | POA: Diagnosis not present

## 2020-09-28 DIAGNOSIS — K7581 Nonalcoholic steatohepatitis (NASH): Secondary | ICD-10-CM | POA: Diagnosis not present

## 2020-09-28 DIAGNOSIS — I8501 Esophageal varices with bleeding: Secondary | ICD-10-CM

## 2020-09-28 DIAGNOSIS — K7460 Unspecified cirrhosis of liver: Secondary | ICD-10-CM | POA: Diagnosis not present

## 2020-09-28 DIAGNOSIS — J939 Pneumothorax, unspecified: Secondary | ICD-10-CM

## 2020-09-28 MED ORDER — LEVOFLOXACIN 500 MG PO TABS: 500.0000 mg | ORAL_TABLET | Freq: Every day | ORAL | 0 refills | Status: DC

## 2020-09-28 NOTE — Progress Notes (Signed)
Subjective:    Patient ID: [name], female    DOB: 1941-09-16, 80 y.o.   MRN: [id number]  HPI   Patient is here today for hospital follow-up.    Admit date: 09/18/2020 Discharge date: 09/25/2020  Admitted From: home Discharge disposition: Home   Recommendations for Outpatient Follow-Up:   1. Outpatient palliative care referral 2. Transition to hospice when family ready as recommended by GI 3. Follow BMP and resume diuretics when patient eating better   Discharge Diagnosis:   Principal Problem:   Hematemesis Active Problems:   Thrombocytopenia ([place])   Dementia without behavioral disturbance (HCC)   Cirrhosis, non-alcoholic (HCC)   Pancytopenia (HCC)   Hyperkalemia   GI bleed    Discharge Condition: Improved.  Diet recommendation: Soft  Wound care: None.  Code status: DNR   History of Present Illness:   [name] Dobbinsis a 80 y.o.femalewith medical history significant fordementia, remote stage II colon cancer, bladder cancer, cirrhosis, chronic pancytopenia, now presenting to the emergency department for evaluation of chest pain, nausea, and vomiting. She is accompanied by her son who assists with the history. Patient reportedly been in her usual state of health when she was eating last night and developed acute onset of pain in her left chest and back, appeared very uncomfortable, and was noted to have some diaphoresis, nausea, and vomiting. EMS was called, administered 324 mg of aspirin, and brought the patient into the emergency department.    Hospital Course by Problem:   Hematemesis/large esophageal varices -Patient with cirrhosis who had esophageal varices and gastric ulcer on EGD in 2013 presents with acute-chest pain and N/V, found to have pneumothorax on left, was treated with ASA 324 mg en route, and developed hematemesis in ED -s/p EGD: Total of seven bands were deployed out of which 6 stayed in place. Evidence  of fibrin plug noted in one of the varix,where the band fell off,but was immediately treated with placement of another band for control of bleeding. -Continue Protonix PO -s/p cipro -family still not quite ready for comfort care but DO NOT want any escalation of care/imaging -current plan is for home with caregivers hired by family, palliative care - ordered DME  Hyperammonemia -PO lactulose-titrate to 3BMs/day   anemia- ABLA  -transfuse for <7  Pneumothorax -Presents with acute-onset chest pain, gripping her left chest, and is found to have small left pneumothorax -CT surgery was consulted by ED physician and indicated that if repeat imaging in am is stable she could be discharged -resolved on repeat x ray  Pancytopenia -Attributed to her chronic liver disease, all CBC indices similar to priors -Outpatient follow-up  Cirrhosis -Appears compensated -Resume diuretics when intake improved  Dementia -very confused but appears to be at baseline  Hyperkalemia  - Serum potassium is 5.7 in ED  - resolved   09/28/20 Patient is a very pleasant 80 year old Caucasian female here today with her husband for hospital discharge follow-up.  She does not appear fluid overloaded despite holding the spironolactone.  If anything she looks pale and dehydrated.  Throughout her entire encounter, she is occasionally gasping for air.  Husband states that she does this frequently.  She does not appear to be labored in her breathing however she does appear that she could be feeling short of breath.  He denies any fever or cough since leaving the hospital.  She denies complaining of any pain in her chest.  They deny any melena or hematochezia or hematemesis.  However on  her exam there are pronounced right basilar crackles concerning for pneumonia.  Her left lung sounds clear therefore I do not believe that this is pulmonary edema.  Furthermore she has no pitting edema in her  extremities and again she appears dehydrated.  I am concerned about a hospital-acquired pneumonia Immunization History  Administered Date(s) Administered  . Fluad Quad(high Dose 65+) 05/20/2019, 07/30/2020  . Influenza, High Dose Seasonal PF 07/05/2014  . Influenza, Quadrivalent, Recombinant, Inj, Pf 09/01/2013  . Influenza,inj,Quad PF,6+ Mos 09/01/2013, 05/31/2015, 09/09/2016, 08/20/2017, 08/12/2018  . Influenza,trivalent, recombinat, inj, PF 07/05/2014  . Influenza-Unspecified 06/10/2012, 09/01/2013, 07/05/2014  . Pneumococcal Conjugate-13 06/18/2015  . Pneumococcal Polysaccharide-23 12/07/2009    Past Medical History:  Diagnosis Date  . Anemia, iron deficiency   . Arthritis   . Bladder cancer [facility]) 2003   Bladder removed - [name]  . Cataract immature   . Cirrhosis ([place]) 08/26/2011  . Colon cancer [facility]) 2001   Hemicolectomy - [name]  . Depression   . GERD (gastroesophageal reflux disease)   . Headache(784.0)   . Hemorrhoids   . History of colon polyps   . History of DVT (deep vein thrombosis) 50+yrs ago   With pregnancy  . History of GI bleed   . History of kidney stones   . History of urostomy    Urostomy d/t hx bladder cancer  . Hx of transfusion of whole blood   . Hypertension   . Joint pain   . Memory loss    Chemotherapy; short and long term  . Osteoporosis   . Thrombocytopenia ([place])    Past Surgical History:  Procedure Laterality Date  . ABDOMINAL HYSTERECTOMY    . APPENDECTOMY    . BLADDER SURGERY     bladder removed d/t cancer  . CHOLECYSTECTOMY    . COLON RESECTION     with colostomy then colostomy reversal  . COLON SURGERY  2004  . COLON SURGERY  2001  . COLONOSCOPY    . ESOPHAGEAL BANDING  09/19/2020   Procedure: ESOPHAGEAL BANDING;  Surgeon: [name], MD;  Location: [facility] ENDOSCOPY;  Service: Gastroenterology;;  . ESOPHAGOGASTRODUODENOSCOPY  02/27/2012   Procedure: ESOPHAGOGASTRODUODENOSCOPY (EGD);  Surgeon: [name], MD;  Location: [name]  ENDOSCOPY;  Service: Endoscopy;  Laterality: N/A;  bedside case  . ESOPHAGOGASTRODUODENOSCOPY N/A 09/19/2020   Procedure: ESOPHAGOGASTRODUODENOSCOPY (EGD);  Surgeon: [name], MD;  Location: [facility];  Service: Gastroenterology;  Laterality: N/A;  . NEPHROLITHOTOMY Left 05/04/2014   Procedure: LEFT PERCUTANEOUS NEPHROLITHOTOMY ;  Surgeon: [name], MD;  Location: [facility] ORS;  Service: Urology;  Laterality: Left;  . TOTAL KNEE ARTHROPLASTY  2010   Left knee    Allergies  Allergen Reactions  . Bee Venom Anaphylaxis  . Codeine Anaphylaxis, Rash and Shortness Of Breath  . Morphine And Related Anaphylaxis and Rash  . Nutritional Supplements Anaphylaxis  . Phytonadione Other (See Comments)    Pt experienced an episode of cyanosis following dose of Vitamin K 10 mg IV.  Marland Kitchen Ceftin [Cefuroxime] Rash  . Penicillins Rash and Other (See Comments)    Has patient had a PCN reaction causing immediate rash, facial/tongue/throat swelling, SOB or lightheadedness with hypotension: No Has patient had a PCN reaction causing severe rash involving mucus membranes or skin necrosis: No Has patient had a PCN reaction that required hospitalization: No Has patient had a PCN reaction occurring within the last 10 years: No If all of the above answers are "NO", then may proceed with Cephalosporin  use.  . Promethazine Hcl Other (See Comments)    Reaction:  Unknown    Social History   Socioeconomic History  . Marital status: Married    Spouse name: Not on file  . Number of children: Not on file  . Years of education: Not on file  . Highest education level: Not on file  Occupational History  . Not on file  Tobacco Use  . Smoking status: Former Smoker    Packs/day: 0.50    Years: 35.00    Pack years: 17.50    Types: Cigarettes    Start date: 10/25/1955    Quit date: 09/22/1991    Years since quitting: 29.0  . Smokeless tobacco: Never Used  . Tobacco comment: quit 23years ago  Vaping Use  . Vaping  Use: Never used  Substance and Sexual Activity  . Alcohol use: No    Alcohol/week: 0.0 standard drinks  . Drug use: No  . Sexual activity: Yes    Birth control/protection: Surgical  Other Topics Concern  . Not on file  Social History Narrative   Lives at home with her husband, ambulatory   Social Determinants of Health   Financial Resource Strain: Not on file  Food Insecurity: Not on file  Transportation Needs: Not on file  Physical Activity: Not on file  Stress: Not on file  Social Connections: Not on file  Intimate Partner Violence: Not on file   Family History  Problem Relation Age of Onset  . Diabetes Brother   . Cancer Other   . Hypertension Other       Review of Systems  All other systems reviewed and are negative.      Objective:   Physical Exam Vitals reviewed.  Constitutional:      General: She is not in acute distress.    Appearance: She is well-developed. She is not diaphoretic.  HENT:     Nose: Nose normal.  Eyes:     General: No scleral icterus.       Right eye: No discharge.        Left eye: No discharge.     Conjunctiva/sclera: Conjunctivae normal.  Neck:     Thyroid: No thyromegaly.  Cardiovascular:     Rate and Rhythm: Normal rate and regular rhythm.     Heart sounds: Murmur heard.    Pulmonary:     Effort: Pulmonary effort is normal. No respiratory distress.     Breath sounds: Rales present. No wheezing.    Abdominal:     General: Bowel sounds are normal. There is no distension.     Palpations: Abdomen is soft. There is no mass.     Tenderness: There is no abdominal tenderness. There is no guarding or rebound.  Musculoskeletal:        General: No tenderness. Normal range of motion.     Cervical back: Neck supple.     Right lower leg: No edema.     Left lower leg: No edema.  Lymphadenopathy:     Cervical: No cervical adenopathy.  Neurological:     Mental Status: She is alert.     Cranial Nerves: No cranial nerve deficit.      Motor: No abnormal muscle tone.     Coordination: Coordination normal.     Deep Tendon Reflexes: Reflexes are normal and symmetric.  Psychiatric:        Cognition and Memory: Memory is impaired.  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  This could be due to her significant anemia.  When she left the hospital her hemoglobin was 7.4.  I will recheck a CBC today to ensure that has not dropped any lower.  However her exam is concerning for possible hospital-acquired pneumonia.  She has a penicillin allergy.  I will start her on Levaquin 500 mg daily for 7 days.  I will order a chest x-ray one to evaluate for possible pneumonia and to to follow-up her pneumothorax however her physical exam does not suggest worsening of the pneumothorax.  Hold off on diuretics at the present time as she appears too weak and frail.  Plan to recheck next week once I see the results of her lab work and the x-ray.

## 2020-09-29 LAB — COMPLETE METABOLIC PANEL WITH GFR
AG Ratio: 1.2 (calc) (ref 1.0–2.5)
ALT: 15 U/L (ref 6–29)
AST: 22 U/L (ref 10–35)
Albumin: 2.7 g/dL — ABNORMAL LOW (ref 3.6–5.1)
Alkaline phosphatase (APISO): 75 U/L (ref 37–153)
BUN/Creatinine Ratio: 18 (calc) (ref 6–22)
BUN: 16 mg/dL (ref 7–25)
CO2: 25 mmol/L (ref 20–32)
Calcium: 9.4 mg/dL (ref 8.6–10.4)
Chloride: 107 mmol/L (ref 98–110)
Creat: 0.91 mg/dL — ABNORMAL HIGH (ref 0.60–0.88)
GFR, Est African American: 69 mL/min/{1.73_m2} (ref >=60)
GFR, Est Non African American: 59 mL/min/{1.73_m2} — ABNORMAL LOW (ref >=60)
Globulin: 2.2 g/dL (calc) (ref 1.9–3.7)
Glucose, Bld: 134 mg/dL — ABNORMAL HIGH (ref 65–99)
Potassium: 3.8 mmol/L (ref 3.5–5.3)
Sodium: 137 mmol/L (ref 135–146)
Total Bilirubin: 1.8 mg/dL — ABNORMAL HIGH (ref 0.2–1.2)
Total Protein: 4.9 g/dL — ABNORMAL LOW (ref 6.1–8.1)

## 2020-09-29 LAB — CBC WITH DIFFERENTIAL/PLATELET
Absolute Monocytes: 879 cells/uL (ref 200–950)
Basophils Absolute: 31 cells/uL (ref 0–200)
Basophils Relative: 0.6 %
Eosinophils Absolute: 109 cells/uL (ref 15–500)
Eosinophils Relative: 2.1 %
HCT: 27.2 % — ABNORMAL LOW (ref 35.0–45.0)
Hemoglobin: 8.9 g/dL — ABNORMAL LOW (ref 11.7–15.5)
Lymphs Abs: 915 cells/uL (ref 850–3900)
MCH: 31.4 pg (ref 27.0–33.0)
MCHC: 32.7 g/dL (ref 32.0–36.0)
MCV: 96.1 fL (ref 80.0–100.0)
MPV: 13.1 fL — ABNORMAL HIGH (ref 7.5–12.5)
Monocytes Relative: 16.9 %
Neutro Abs: 3266 cells/uL (ref 1500–7800)
Neutrophils Relative %: 62.8 %
Platelets: 68 10*3/uL — ABNORMAL LOW (ref 140–400)
RBC: 2.83 10*6/uL — ABNORMAL LOW (ref 3.80–5.10)
RDW: 15.4 % — ABNORMAL HIGH (ref 11.0–15.0)
Total Lymphocyte: 17.6 %
WBC: 5.2 10*3/uL (ref 3.8–10.8)

## 2020-10-01 ENCOUNTER — Other Ambulatory Visit: Payer: Self-pay

## 2020-10-01 ENCOUNTER — Ambulatory Visit (HOSPITAL_COMMUNITY)
Admission: RE | Admit: 2020-10-01 | Discharge: 2020-10-01 | Disposition: A | Discharge status: Home / Self Care | Payer: Medicare Other | Source: Ambulatory Visit | Attending: Family Medicine | Admitting: Family Medicine

## 2020-10-01 DIAGNOSIS — J939 Pneumothorax, unspecified: Secondary | ICD-10-CM | POA: Insufficient documentation

## 2020-10-05 ENCOUNTER — Encounter: Payer: Self-pay | Admitting: Family Medicine

## 2020-10-05 ENCOUNTER — Other Ambulatory Visit: Payer: Self-pay

## 2020-10-05 ENCOUNTER — Ambulatory Visit (INDEPENDENT_AMBULATORY_CARE_PROVIDER_SITE_OTHER): Payer: Medicare Other | Admitting: Family Medicine

## 2020-10-05 VITALS — BP 108/62 | HR 62 | Temp 97.9°F | Wt 195.0 lb

## 2020-10-05 DIAGNOSIS — K7581 Nonalcoholic steatohepatitis (NASH): Secondary | ICD-10-CM | POA: Diagnosis not present

## 2020-10-05 DIAGNOSIS — K922 Gastrointestinal hemorrhage, unspecified: Secondary | ICD-10-CM | POA: Diagnosis not present

## 2020-10-05 DIAGNOSIS — D61818 Other pancytopenia: Secondary | ICD-10-CM

## 2020-10-05 DIAGNOSIS — K7460 Unspecified cirrhosis of liver: Secondary | ICD-10-CM | POA: Diagnosis not present

## 2020-10-05 DIAGNOSIS — I8501 Esophageal varices with bleeding: Secondary | ICD-10-CM | POA: Diagnosis not present

## 2020-10-05 DIAGNOSIS — K7469 Other cirrhosis of liver: Secondary | ICD-10-CM

## 2020-10-05 NOTE — Progress Notes (Signed)
Subjective:    Patient ID: [name], female    DOB: 1941/09/08, 80 y.o.   MRN: [id number]  HPI   Patient is here today for hospital follow-up.    Admit date: 09/18/2020 Discharge date: 09/25/2020  Admitted From: home Discharge disposition: Home   Recommendations for Outpatient Follow-Up:   1. Outpatient palliative care referral 2. Transition to hospice when family ready as recommended by GI 3. Follow BMP and resume diuretics when patient eating better   Discharge Diagnosis:   Principal Problem:   Hematemesis Active Problems:   Thrombocytopenia ([place])   Dementia without behavioral disturbance (HCC)   Cirrhosis, non-alcoholic (HCC)   Pancytopenia (HCC)   Hyperkalemia   GI bleed    Discharge Condition: Improved.  Diet recommendation: Soft  Wound care: None.  Code status: DNR   History of Present Illness:   Erica Klingensmith Dobbinsis a 80 y.o.femalewith medical history significant fordementia, remote stage II colon cancer, bladder cancer, cirrhosis, chronic pancytopenia, now presenting to the emergency department for evaluation of chest pain, nausea, and vomiting. She is accompanied by her son who assists with the history. Patient reportedly been in her usual state of health when she was eating last night and developed acute onset of pain in her left chest and back, appeared very uncomfortable, and was noted to have some diaphoresis, nausea, and vomiting. EMS was called, administered 324 mg of aspirin, and brought the patient into the emergency department.    Hospital Course by Problem:   Hematemesis/large esophageal varices -Patient with cirrhosis who had esophageal varices and gastric ulcer on EGD in 2013 presents with acute-chest pain and N/V, found to have pneumothorax on left, was treated with ASA 324 mg en route, and developed hematemesis in ED -s/p EGD: Total of seven bands were deployed out of which 6 stayed in place. Evidence  of fibrin plug noted in one of the varix,where the band fell off,but was immediately treated with placement of another band for control of bleeding. -Continue Protonix PO -s/p cipro -family still not quite ready for comfort care but DO NOT want any escalation of care/imaging -current plan is for home with caregivers hired by family, palliative care - ordered DME  Hyperammonemia -PO lactulose-titrate to 3BMs/day   anemia- ABLA  -transfuse for <7  Pneumothorax -Presents with acute-onset chest pain, gripping her left chest, and is found to have small left pneumothorax -CT surgery was consulted by ED physician and indicated that if repeat imaging in am is stable she could be discharged -resolved on repeat x ray  Pancytopenia -Attributed to her chronic liver disease, all CBC indices similar to priors -Outpatient follow-up  Cirrhosis -Appears compensated -Resume diuretics when intake improved  Dementia -very confused but appears to be at baseline  Hyperkalemia  - Serum potassium is 5.7 in ED  - resolved   09/28/20 Patient is a very pleasant 80 year old Caucasian female here today with her husband for hospital discharge follow-up.  She does not appear fluid overloaded despite holding the spironolactone.  If anything she looks pale and dehydrated.  Throughout her entire encounter, she is occasionally gasping for air.  Husband states that she does this frequently.  She does not appear to be labored in her breathing however she does appear that she could be feeling short of breath.  He denies any fever or cough since leaving the hospital.  She denies complaining of any pain in her chest.  They deny any melena or hematochezia or hematemesis.  However on  her exam there are pronounced right basilar crackles concerning for pneumonia.  Her left lung sounds clear therefore I do not believe that this is pulmonary edema.  Furthermore she has no pitting edema in her  extremities and again she appears dehydrated.  I am concerned about a hospital-acquired pneumonia.  At that time, my plan was: History is limited due to the patient's dementia however believe that she is feeling short of breath given her breathing pattern.  This could be due to her significant anemia.  When she left the hospital her hemoglobin was 7.4.  I will recheck a CBC today to ensure that has not dropped any lower.  However her exam is concerning for possible hospital-acquired pneumonia.  She has a penicillin allergy.  I will start her on Levaquin 500 mg daily for 7 days.  I will order a chest x-ray one to evaluate for possible pneumonia and to to follow-up her pneumothorax however her physical exam does not suggest worsening of the pneumothorax.  Hold off on diuretics at the present time as she appears too weak and frail.  Plan to recheck next week once I see the results of her lab work and the x-ray.  10/05/20 CXR revealed: Small left and trace right pleural effusions. No frank interstitial edema. Left lower lobe opacity, likely atelectasis.  No visits with results within 1 Week(s) from this visit.  Latest known visit with results is:  Office Visit on 09/28/2020  Component Date Value Ref Range Status  . WBC 09/28/2020 5.2  3.8 - 10.8 Thousand/uL Final  . RBC 09/28/2020 2.83* 3.80 - 5.10 Million/uL Final  . Hemoglobin 09/28/2020 8.9* 11.7 - 15.5 g/dL Final  . HCT 09/28/2020 27.2* 35.0 - 45.0 % Final  . MCV 09/28/2020 96.1  80.0 - 100.0 fL Final  . MCH 09/28/2020 31.4  27.0 - 33.0 pg Final  . MCHC 09/28/2020 32.7  32.0 - 36.0 g/dL Final  . RDW 09/28/2020 15.4* 11.0 - 15.0 % Final  . Platelets 09/28/2020 68* 140 - 400 Thousand/uL Final  . MPV 09/28/2020 13.1* 7.5 - 12.5 fL Final  . Neutro Abs 09/28/2020 3,266  1,500 - 7,800 cells/uL Final  . Lymphs Abs 09/28/2020 915  850 - 3,900 cells/uL Final  . Absolute Monocytes 09/28/2020 879  200 - 950 cells/uL Final  . Eosinophils Absolute  09/28/2020 109  15 - 500 cells/uL Final  . Basophils Absolute 09/28/2020 31  0 - 200 cells/uL Final  . Neutrophils Relative % 09/28/2020 62.8  % Final  . Total Lymphocyte 09/28/2020 17.6  % Final  . Monocytes Relative 09/28/2020 16.9  % Final  . Eosinophils Relative 09/28/2020 2.1  % Final  . Basophils Relative 09/28/2020 0.6  % Final  . Smear Review 09/28/2020    Final   Comment: Review of the peripheral smear reveals decreased numbers of platelets. Review of peripheral smear confirms automated results.   . Glucose, Bld 09/28/2020 134* 65 - 99 mg/dL Final   Comment: .            Fasting reference interval . For someone without known diabetes, a glucose value >125 mg/dL indicates that they may have diabetes and this should be confirmed with a follow-up test. .   . BUN 09/28/2020 16  7 - 25 mg/dL Final  . Creat 09/28/2020 0.91* 0.60 - 0.88 mg/dL Final   Comment: For patients >40 years of age, the reference limit for Creatinine is approximately 13% higher for people identified as African-American. Marland Kitchen   Marland Kitchen  GFR, Est Non African American 09/28/2020 59* > OR = 60 mL/min/1.82m Final  . GFR, Est African American 09/28/2020 69  > OR = 60 mL/min/1.755mFinal  . BUN/Creatinine Ratio 09/28/2020 18  6 - 22 (calc) Final  . Sodium 09/28/2020 137  135 - 146 mmol/L Final  . Potassium 09/28/2020 3.8  3.5 - 5.3 mmol/L Final  . Chloride 09/28/2020 107  98 - 110 mmol/L Final  . CO2 09/28/2020 25  20 - 32 mmol/L Final  . Calcium 09/28/2020 9.4  8.6 - 10.4 mg/dL Final  . Total Protein 09/28/2020 4.9* 6.1 - 8.1 g/dL Final  . Albumin 09/28/2020 2.7* 3.6 - 5.1 g/dL Final  . Globulin 09/28/2020 2.2  1.9 - 3.7 g/dL (calc) Final  . AG Ratio 09/28/2020 1.2  1.0 - 2.5 (calc) Final  . Total Bilirubin 09/28/2020 1.8* 0.2 - 1.2 mg/dL Final  . Alkaline phosphatase (APISO) 09/28/2020 75  37 - 153 U/L Final  . AST 09/28/2020 22  10 - 35 U/L Final  . ALT 09/28/2020 15  6 - 29 U/L Final   Patient is here  today with her daughter and her husband.  Her weight is 195 pounds.  Husband denies any shortness of breath or chest pain or increased work of breathing.  There is no pitting edema in her legs.  Her lungs today are actually clear to auscultation with no rhonchorous breath sounds or rails.  She does not appear fluid overloaded.  However her blood pressure remains very low.  Therefore at the present time she does not appear to need the spironolactone or the losartan.  We again discussed hospice.  However the husband is very hesitant to want to allow hospice.  Emotionally he is not prepared for that.  I did explain the severity of the patient's illness.  I anticipate that her life expectancy is less than 6 months.  The husband will consider and if he changes his mind I will be glad to contact hospice on his behalf.  Immunization History  Administered Date(s) Administered  . Fluad Quad(high Dose 65+) 05/20/2019, 07/30/2020  . Influenza, High Dose Seasonal PF 07/05/2014  . Influenza, Quadrivalent, Recombinant, Inj, Pf 09/01/2013  . Influenza,inj,Quad PF,6+ Mos 09/01/2013, 05/31/2015, 09/09/2016, 08/20/2017, 08/12/2018  . Influenza,trivalent, recombinat, inj, PF 07/05/2014  . Influenza-Unspecified 06/10/2012, 09/01/2013, 07/05/2014  . Pneumococcal Conjugate-13 06/18/2015  . Pneumococcal Polysaccharide-23 12/07/2009    Past Medical History:  Diagnosis Date  . Anemia, iron deficiency   . Arthritis   . [facility]   Bladder removed - Dr WrJeffie Pollock. Cataract immature   . Cir[facility]osis ([facility]12[facility]  . [facility]   Hemicolectomy - Dr. StMargot Chimes. Depression   . GERD (gastroesophageal reflux disease)   . Headache(784.0)   . Hemorrhoids   . History of colon polyps   . History of DVT (deep vein thrombosis) 50+yrs ago   With pregnancy  . History of GI bleed   . History of kidney stones   . History of urostomy    Urostomy d/t hx bladder cancer  . Hx of transfusion of whole  blood   . Hypertension   . Joint pain   . Memory loss    Chemotherapy; short and long term  . Osteoporosis   . Thrombocytopenia ([facility]   Past Surgical History:  Procedure Laterality Date  . ABDOMINAL HYSTERECTOMY    . APPENDECTOMY    . BLADDER SURGERY     bladder removed  d/t cancer  . CHOLECYSTECTOMY    . COLON RESECTION     with colostomy then colostomy reversal  . COLON SURGERY  2004  . COLON SURGERY  2001  . COLONOSCOPY    . ESOPHAGEAL BANDING  09/19/2020   Procedure: ESOPHAGEAL BANDING;  Surgeon: [name], MD;  Location: [facility] ENDOSCOPY;  Service: Gastroenterology;;  . ESOPHAGOGASTRODUODENOSCOPY  02/27/2012   Procedure: ESOPHAGOGASTRODUODENOSCOPY (EGD);  Surgeon: [name], MD;  Location: [name];  Service: Endoscopy;  Laterality: N/A;  bedside case  . ESOPHAGOGASTRODUODENOSCOPY N/A 09/19/2020   Procedure: ESOPHAGOGASTRODUODENOSCOPY (EGD);  Surgeon: [name], MD;  Location: [facility];  Service: Gastroenterology;  Laterality: N/A;  . NEPHROLITHOTOMY Left 05/04/2014   Procedure: LEFT PERCUTANEOUS NEPHROLITHOTOMY ;  Surgeon: [name], MD;  Location: [facility] ORS;  Service: Urology;  Laterality: Left;  . TOTAL KNEE ARTHROPLASTY  2010   Left knee    Allergies  Allergen Reactions  . Bee Venom Anaphylaxis  . Codeine Anaphylaxis, Rash and Shortness Of Breath  . Morphine And Related Anaphylaxis and Rash  . Nutritional Supplements Anaphylaxis  . Phytonadione Other (See Comments)    Pt experienced an episode of cyanosis following dose of Vitamin K 10 mg IV.  Marland Kitchen Ceftin [Cefuroxime] Rash  . Penicillins Rash and Other (See Comments)    Has patient had a PCN reaction causing immediate rash, facial/tongue/throat swelling, SOB or lightheadedness with hypotension: No Has patient had a PCN reaction causing severe rash involving mucus membranes or skin necrosis: No Has patient had a PCN reaction that required hospitalization: No Has patient had a PCN reaction occurring within the  last 10 years: No If all of the above answers are "NO", then may proceed with Cephalosporin use.  . Promethazine Hcl Other (See Comments)    Reaction:  Unknown    Social History   Socioeconomic History  . Marital status: Married    Spouse name: Not on file  . Number of children: Not on file  . Years of education: Not on file  . Highest education level: Not on file  Occupational History  . Not on file  Tobacco Use  . Smoking status: Former Smoker    Packs/day: 0.50    Years: 35.00    Pack years: 17.50    Types: Cigarettes    Start date: 10/25/1955    Quit date: 09/22/1991    Years since quitting: 29.0  . Smokeless tobacco: Never Used  . Tobacco comment: quit 23years ago  Vaping Use  . Vaping Use: Never used  Substance and Sexual Activity  . Alcohol use: No    Alcohol/week: 0.0 Robertson drinks  . Drug use: No  . Sexual activity: Yes    Birth control/protection: Surgical  Other Topics Concern  . Not on file  Social History Narrative   Lives at home with her husband, ambulatory   Social Determinants of Health   Financial Resource Strain: Not on file  Food Insecurity: Not on file  Transportation Needs: Not on file  Physical Activity: Not on file  Stress: Not on file  Social Connections: Not on file  Intimate Partner Violence: Not on file   Family History  Problem Relation Age of Onset  . Diabetes Brother   . Cancer Other   . Hypertension Other       Review of Systems  All other systems reviewed and are negative.      Objective:   Physical Exam Vitals reviewed.  Constitutional:  General: She is not in acute distress.    Appearance: She is well-developed. She is not diaphoretic.  HENT:     Nose: Nose normal.  Eyes:     General: No scleral icterus.       Right eye: No discharge.        Left eye: No discharge.     Conjunctiva/sclera: Conjunctivae normal.  Neck:     Thyroid: No thyromegaly.  Cardiovascular:     Rate and Rhythm: Normal rate and  regular rhythm.     Heart sounds: Murmur heard.    Pulmonary:     Effort: Pulmonary effort is normal. No respiratory distress.     Breath sounds: No wheezing or rales.  Abdominal:     General: Bowel sounds are normal. There is no distension.     Palpations: Abdomen is soft. There is no mass.     Tenderness: There is no abdominal tenderness. There is no guarding or rebound.  Musculoskeletal:        General: No tenderness. Normal range of motion.     Cervical back: Neck supple.     Right lower leg: No edema.     Left lower leg: No edema.  Lymphadenopathy:     Cervical: No cervical adenopathy.  Neurological:     Mental Status: She is alert.     Cranial Nerves: No cranial nerve deficit.     Motor: No abnormal muscle tone.     Coordination: Coordination normal.     Deep Tendon Reflexes: Reflexes are normal and symmetric.  Psychiatric:        Cognition and Memory: Memory is impaired.            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  I provided the family instructions on when I would resume the medications.  Otherwise I would like to recheck her in 3 weeks.  If she starts to demonstrate rapid weight gain or edema we can certainly resume the spironolactone before however I still feel that her appetite is poor and she is not drinking enough and therefore the spironolactone would likely only exacerbate dehydration.  On her most recent CBC, her hemoglobin had improved from 7.4-8.9.  This was excellent.  They deny any melena or hematochezia or hematemesis.  Therefore I will recheck the patient in 3 weeks and repeat a CBC at that time.

## 2020-10-12 ENCOUNTER — Other Ambulatory Visit: Payer: Self-pay

## 2020-10-12 ENCOUNTER — Emergency Department (HOSPITAL_COMMUNITY): Payer: Medicare Other

## 2020-10-12 ENCOUNTER — Inpatient Hospital Stay (HOSPITAL_COMMUNITY)
Admission: EM | Admit: 2020-10-12 | Discharge: 2020-10-15 | DRG: 199 | Disposition: A | Discharge status: Hospice | Payer: Medicare Other | Attending: Internal Medicine | Admitting: Internal Medicine

## 2020-10-12 ENCOUNTER — Encounter (HOSPITAL_COMMUNITY): Payer: Self-pay | Admitting: Emergency Medicine

## 2020-10-12 ENCOUNTER — Telehealth: Payer: Self-pay | Admitting: Family Medicine

## 2020-10-12 DIAGNOSIS — J449 Chronic obstructive pulmonary disease, unspecified: Secondary | ICD-10-CM

## 2020-10-12 DIAGNOSIS — Z87442 Personal history of urinary calculi: Secondary | ICD-10-CM | POA: Diagnosis not present

## 2020-10-12 DIAGNOSIS — R079 Chest pain, unspecified: Secondary | ICD-10-CM | POA: Diagnosis not present

## 2020-10-12 DIAGNOSIS — Z87891 Personal history of nicotine dependence: Secondary | ICD-10-CM | POA: Diagnosis not present

## 2020-10-12 DIAGNOSIS — D509 Iron deficiency anemia, unspecified: Secondary | ICD-10-CM | POA: Diagnosis present

## 2020-10-12 DIAGNOSIS — Z936 Other artificial openings of urinary tract status: Secondary | ICD-10-CM

## 2020-10-12 DIAGNOSIS — Z7189 Other specified counseling: Secondary | ICD-10-CM | POA: Diagnosis not present

## 2020-10-12 DIAGNOSIS — Z86718 Personal history of other venous thrombosis and embolism: Secondary | ICD-10-CM

## 2020-10-12 DIAGNOSIS — R0789 Other chest pain: Secondary | ICD-10-CM | POA: Diagnosis not present

## 2020-10-12 DIAGNOSIS — Z8719 Personal history of other diseases of the digestive system: Secondary | ICD-10-CM | POA: Diagnosis not present

## 2020-10-12 DIAGNOSIS — J9311 Primary spontaneous pneumothorax: Secondary | ICD-10-CM

## 2020-10-12 DIAGNOSIS — K7581 Nonalcoholic steatohepatitis (NASH): Secondary | ICD-10-CM | POA: Diagnosis present

## 2020-10-12 DIAGNOSIS — C189 Malignant neoplasm of colon, unspecified: Secondary | ICD-10-CM | POA: Diagnosis present

## 2020-10-12 DIAGNOSIS — R410 Disorientation, unspecified: Secondary | ICD-10-CM | POA: Diagnosis not present

## 2020-10-12 DIAGNOSIS — K7200 Acute and subacute hepatic failure without coma: Secondary | ICD-10-CM | POA: Diagnosis present

## 2020-10-12 DIAGNOSIS — D500 Iron deficiency anemia secondary to blood loss (chronic): Secondary | ICD-10-CM

## 2020-10-12 DIAGNOSIS — I8500 Esophageal varices without bleeding: Secondary | ICD-10-CM | POA: Diagnosis present

## 2020-10-12 DIAGNOSIS — G9340 Encephalopathy, unspecified: Secondary | ICD-10-CM | POA: Diagnosis present

## 2020-10-12 DIAGNOSIS — R279 Unspecified lack of coordination: Secondary | ICD-10-CM | POA: Diagnosis not present

## 2020-10-12 DIAGNOSIS — D696 Thrombocytopenia, unspecified: Secondary | ICD-10-CM | POA: Diagnosis present

## 2020-10-12 DIAGNOSIS — Z66 Do not resuscitate: Secondary | ICD-10-CM | POA: Diagnosis present

## 2020-10-12 DIAGNOSIS — R404 Transient alteration of awareness: Secondary | ICD-10-CM | POA: Diagnosis not present

## 2020-10-12 DIAGNOSIS — Z880 Allergy status to penicillin: Secondary | ICD-10-CM

## 2020-10-12 DIAGNOSIS — M810 Age-related osteoporosis without current pathological fracture: Secondary | ICD-10-CM | POA: Diagnosis present

## 2020-10-12 DIAGNOSIS — F32A Depression, unspecified: Secondary | ICD-10-CM | POA: Diagnosis present

## 2020-10-12 DIAGNOSIS — I8510 Secondary esophageal varices without bleeding: Secondary | ICD-10-CM | POA: Diagnosis present

## 2020-10-12 DIAGNOSIS — J939 Pneumothorax, unspecified: Secondary | ICD-10-CM | POA: Diagnosis present

## 2020-10-12 DIAGNOSIS — F0390 Unspecified dementia without behavioral disturbance: Secondary | ICD-10-CM | POA: Diagnosis present

## 2020-10-12 DIAGNOSIS — Z885 Allergy status to narcotic agent status: Secondary | ICD-10-CM

## 2020-10-12 DIAGNOSIS — Z743 Need for continuous supervision: Secondary | ICD-10-CM | POA: Diagnosis not present

## 2020-10-12 DIAGNOSIS — Z515 Encounter for palliative care: Secondary | ICD-10-CM | POA: Diagnosis not present

## 2020-10-12 DIAGNOSIS — M1990 Unspecified osteoarthritis, unspecified site: Secondary | ICD-10-CM | POA: Diagnosis present

## 2020-10-12 DIAGNOSIS — K219 Gastro-esophageal reflux disease without esophagitis: Secondary | ICD-10-CM | POA: Diagnosis present

## 2020-10-12 DIAGNOSIS — Z8249 Family history of ischemic heart disease and other diseases of the circulatory system: Secondary | ICD-10-CM | POA: Diagnosis not present

## 2020-10-12 DIAGNOSIS — C679 Malignant neoplasm of bladder, unspecified: Secondary | ICD-10-CM | POA: Diagnosis present

## 2020-10-12 DIAGNOSIS — Z20822 Contact with and (suspected) exposure to covid-19: Secondary | ICD-10-CM | POA: Diagnosis present

## 2020-10-12 DIAGNOSIS — I4510 Unspecified right bundle-branch block: Secondary | ICD-10-CM | POA: Diagnosis not present

## 2020-10-12 DIAGNOSIS — I10 Essential (primary) hypertension: Secondary | ICD-10-CM | POA: Diagnosis present

## 2020-10-12 DIAGNOSIS — Z881 Allergy status to other antibiotic agents status: Secondary | ICD-10-CM

## 2020-10-12 DIAGNOSIS — Z90710 Acquired absence of both cervix and uterus: Secondary | ICD-10-CM

## 2020-10-12 DIAGNOSIS — Z91030 Bee allergy status: Secondary | ICD-10-CM

## 2020-10-12 DIAGNOSIS — K7460 Unspecified cirrhosis of liver: Secondary | ICD-10-CM | POA: Diagnosis present

## 2020-10-12 DIAGNOSIS — Z9049 Acquired absence of other specified parts of digestive tract: Secondary | ICD-10-CM

## 2020-10-12 DIAGNOSIS — Z888 Allergy status to other drugs, medicaments and biological substances status: Secondary | ICD-10-CM

## 2020-10-12 DIAGNOSIS — R531 Weakness: Secondary | ICD-10-CM | POA: Diagnosis not present

## 2020-10-12 DIAGNOSIS — E860 Dehydration: Secondary | ICD-10-CM | POA: Diagnosis present

## 2020-10-12 DIAGNOSIS — R109 Unspecified abdominal pain: Secondary | ICD-10-CM | POA: Diagnosis not present

## 2020-10-12 DIAGNOSIS — R4182 Altered mental status, unspecified: Secondary | ICD-10-CM | POA: Diagnosis not present

## 2020-10-12 DIAGNOSIS — Z79899 Other long term (current) drug therapy: Secondary | ICD-10-CM

## 2020-10-12 LAB — COMPREHENSIVE METABOLIC PANEL
ALT: 20 U/L (ref 0–44)
AST: 28 U/L (ref 15–41)
Albumin: 2.6 g/dL — ABNORMAL LOW (ref 3.5–5.0)
Alkaline Phosphatase: 80 U/L (ref 38–126)
Anion gap: 6 (ref 5–15)
BUN: 18 mg/dL (ref 8–23)
CO2: 24 mmol/L (ref 22–32)
Calcium: 9.6 mg/dL (ref 8.9–10.3)
Chloride: 108 mmol/L (ref 98–111)
Creatinine, Ser: 0.82 mg/dL (ref 0.44–1.00)
GFR, Estimated: >60 mL/min (ref >60)
Glucose, Bld: 130 mg/dL — ABNORMAL HIGH (ref 70–99)
Potassium: 4.3 mmol/L (ref 3.5–5.1)
Sodium: 138 mmol/L (ref 135–145)
Total Bilirubin: 1.8 mg/dL — ABNORMAL HIGH (ref 0.3–1.2)
Total Protein: 5 g/dL — ABNORMAL LOW (ref 6.5–8.1)

## 2020-10-12 LAB — CBC WITH DIFFERENTIAL/PLATELET
Abs Immature Granulocytes: 0.02 10*3/uL (ref 0.00–0.07)
Basophils Absolute: 0 10*3/uL (ref 0.0–0.1)
Basophils Relative: 0 %
Eosinophils Absolute: 0 10*3/uL (ref 0.0–0.5)
Eosinophils Relative: 0 %
HCT: 27.6 % — ABNORMAL LOW (ref 36.0–46.0)
Hemoglobin: 8.6 g/dL — ABNORMAL LOW (ref 12.0–15.0)
Immature Granulocytes: 0 %
Lymphocytes Relative: 4 %
Lymphs Abs: 0.2 10*3/uL — ABNORMAL LOW (ref 0.7–4.0)
MCH: 30.3 pg (ref 26.0–34.0)
MCHC: 31.2 g/dL (ref 30.0–36.0)
MCV: 97.2 fL (ref 80.0–100.0)
Monocytes Absolute: 0.5 10*3/uL (ref 0.1–1.0)
Monocytes Relative: 9 %
Neutro Abs: 4.8 10*3/uL (ref 1.7–7.7)
Neutrophils Relative %: 87 %
Platelets: 52 10*3/uL — ABNORMAL LOW (ref 150–400)
RBC: 2.84 MIL/uL — ABNORMAL LOW (ref 3.87–5.11)
RDW: 15.9 % — ABNORMAL HIGH (ref 11.5–15.5)
WBC: 5.6 10*3/uL (ref 4.0–10.5)
nRBC: 0 % (ref 0.0–0.2)

## 2020-10-12 LAB — SARS CORONAVIRUS 2 BY RT PCR (HOSPITAL ORDER, PERFORMED IN ~~LOC~~ HOSPITAL LAB): SARS Coronavirus 2: NEGATIVE

## 2020-10-12 MED ORDER — SODIUM CHLORIDE 0.9% FLUSH: 3.0000 mL | Freq: Two times a day (BID) | INTRAVENOUS | Status: DC

## 2020-10-12 MED ORDER — HYDROMORPHONE HCL 1 MG/ML IJ SOLN
0.5000 mg | INTRAMUSCULAR | Status: DC | PRN
  Administered 2020-10-12: 0.5 mg via INTRAVENOUS
  Filled 2020-10-12 (×2): qty 0.5

## 2020-10-12 MED ORDER — OLANZAPINE 5 MG PO TBDP
5.0000 mg | ORAL_TABLET | Freq: Every day | ORAL | Status: DC
  Administered 2020-10-12 – 2020-10-14 (×3): 5 mg via ORAL
  Filled 2020-10-12 (×6): qty 1

## 2020-10-12 MED ORDER — ACETAMINOPHEN 325 MG PO TABS: 650.0000 mg | ORAL_TABLET | Freq: Four times a day (QID) | ORAL | Status: DC | PRN

## 2020-10-12 MED ORDER — SODIUM CHLORIDE 0.9 % IV BOLUS
500.0000 mL | Freq: Once | INTRAVENOUS | Status: AC
  Administered 2020-10-12: 500 mL via INTRAVENOUS

## 2020-10-12 MED ORDER — ACETAMINOPHEN 650 MG RE SUPP: 650.0000 mg | Freq: Four times a day (QID) | RECTAL | Status: DC | PRN

## 2020-10-12 MED ORDER — ALBUTEROL SULFATE (2.5 MG/3ML) 0.083% IN NEBU: 2.5000 mg | INHALATION_SOLUTION | RESPIRATORY_TRACT | Status: DC | PRN

## 2020-10-12 MED ORDER — SODIUM CHLORIDE 0.9% FLUSH
3.0000 mL | INTRAVENOUS | Status: DC | PRN
  Administered 2020-10-14: 3 mL via INTRAVENOUS

## 2020-10-12 MED ORDER — KETOROLAC TROMETHAMINE 15 MG/ML IJ SOLN
15.0000 mg | Freq: Three times a day (TID) | INTRAMUSCULAR | Status: DC | PRN
  Administered 2020-10-13: 15 mg via INTRAVENOUS
  Filled 2020-10-12: qty 1

## 2020-10-12 MED ORDER — BISACODYL 10 MG RE SUPP: 10.0000 mg | Freq: Every day | RECTAL | Status: DC | PRN

## 2020-10-12 MED ORDER — DEXTROSE-NACL 5-0.45 % IV SOLN: INTRAVENOUS | Status: DC

## 2020-10-12 MED ORDER — TRAZODONE HCL 50 MG PO TABS: 50.0000 mg | ORAL_TABLET | Freq: Every evening | ORAL | Status: DC | PRN

## 2020-10-12 MED ORDER — POLYVINYL ALCOHOL 1.4 % OP SOLN: 1.0000 [drp] | Freq: Four times a day (QID) | OPHTHALMIC | Status: DC | PRN

## 2020-10-12 MED ORDER — SODIUM CHLORIDE 0.9 % IV SOLN: 250.0000 mL | INTRAVENOUS | Status: DC | PRN

## 2020-10-12 MED ORDER — CIPROFLOXACIN IN D5W 400 MG/200ML IV SOLN
400.0000 mg | Freq: Two times a day (BID) | INTRAVENOUS | Status: DC
  Administered 2020-10-12 – 2020-10-13 (×2): 400 mg via INTRAVENOUS
  Filled 2020-10-12 (×3): qty 200

## 2020-10-12 MED ORDER — ONDANSETRON HCL 4 MG/2ML IJ SOLN: 4.0000 mg | Freq: Four times a day (QID) | INTRAMUSCULAR | Status: DC | PRN

## 2020-10-12 MED ORDER — SODIUM CHLORIDE 0.9% FLUSH
3.0000 mL | Freq: Two times a day (BID) | INTRAVENOUS | Status: DC
  Administered 2020-10-12 – 2020-10-14 (×4): 3 mL via INTRAVENOUS

## 2020-10-12 MED ORDER — BIOTENE DRY MOUTH MT LIQD: 15.0000 mL | OROMUCOSAL | Status: DC | PRN

## 2020-10-12 MED ORDER — DIPHENHYDRAMINE HCL 50 MG/ML IJ SOLN: 12.5000 mg | INTRAMUSCULAR | Status: DC | PRN

## 2020-10-12 MED ORDER — SODIUM CHLORIDE 0.9% FLUSH
3.0000 mL | INTRAVENOUS | Status: DC | PRN
  Administered 2020-10-14 (×2): 3 mL via INTRAVENOUS

## 2020-10-12 MED ORDER — LACTULOSE 10 GM/15ML PO SOLN
20.0000 g | Freq: Three times a day (TID) | ORAL | Status: DC
  Administered 2020-10-12 – 2020-10-14 (×5): 20 g via ORAL
  Filled 2020-10-12 (×6): qty 30

## 2020-10-12 MED ORDER — ONDANSETRON HCL 4 MG PO TABS: 4.0000 mg | ORAL_TABLET | Freq: Four times a day (QID) | ORAL | Status: DC | PRN

## 2020-10-12 MED ORDER — GLYCOPYRROLATE 1 MG PO TABS
1.0000 mg | ORAL_TABLET | ORAL | Status: DC | PRN
  Filled 2020-10-12: qty 1

## 2020-10-12 MED ORDER — GLYCOPYRROLATE 0.2 MG/ML IJ SOLN: 0.2000 mg | INTRAMUSCULAR | Status: DC | PRN

## 2020-10-12 MED ORDER — MAGIC MOUTHWASH W/LIDOCAINE
5.0000 mL | Freq: Three times a day (TID) | ORAL | Status: DC | PRN
  Filled 2020-10-12: qty 5

## 2020-10-12 MED ORDER — ACETAMINOPHEN 500 MG PO TABS
1000.0000 mg | ORAL_TABLET | Freq: Once | ORAL | Status: AC
  Administered 2020-10-12: 1000 mg via ORAL
  Filled 2020-10-12: qty 2

## 2020-10-12 MED ORDER — MIRTAZAPINE 15 MG PO TBDP
15.0000 mg | ORAL_TABLET | Freq: Every day | ORAL | Status: DC
  Administered 2020-10-13 – 2020-10-14 (×2): 15 mg via ORAL
  Filled 2020-10-12 (×5): qty 1

## 2020-10-12 MED ORDER — LORAZEPAM 2 MG/ML IJ SOLN
1.0000 mg | INTRAMUSCULAR | Status: DC | PRN
  Administered 2020-10-12: 1 mg via INTRAVENOUS
  Filled 2020-10-12: qty 1

## 2020-10-12 NOTE — ED Provider Notes (Signed)
I was asked to go into her room and check patient because her "pupils were equal and now they are not, one is bigger than the other".  When I go in the room patient is awake, she lifts her head up and look at people entering the room.  She seems a little confused.  Her pupils are small bilaterally.  EMS is still at bedside.   Rolland Porter, MD 10/12/20 [phone]

## 2020-10-12 NOTE — ED Notes (Signed)
Pulled out IV. Continues to pull of cardiac monitoring leads, bp cuff, O2 monitor.  Attempted to redirect.  Husband at bedside however has not been redirecting pt.  Will cont to monitor

## 2020-10-12 NOTE — Telephone Encounter (Signed)
[name] is the daughter of [name]. mother was admit in the hosptial would like to speak with Dr. Dennard Schaumann about the treatment that hosptial given her

## 2020-10-12 NOTE — Consult Note (Signed)
Consultation Note Date: 10/12/2020   Patient Name: [name]  DOB: 1940-10-02  MRN: [id number]  Age / Sex: 80 y.o., female  PCP: [name], MD Referring Physician: Roxan Hockey, MD  Reason for Consultation: Establishing goals of care  HPI/Patient Profile: 80 y.o. female  with past medical history of dementia, NASH cirrhosis, esophageal varices, hepatic encephalopathy, anemia, thrombocytopenia, hypertension, colon cancer, bladder cancer, GERD admitted on 10/12/2020 with chest pain related to left pneumothorax.   Clinical Assessment and Goals of Care: I met today at [name] bedside with her husband. She is lying on stretcher and pleasantly confused. I spoke with husband about palliative care and explained what we do. They met with my colleague during recent stay at [facility] with discussions and consideration of hospice care but they decided to wait on hospice. MOST was completed for DNR, limited interventions, no feeding tube. Noted that they were not yet ready for hospice but also did not desire escalation in care.   [name] shares with me that he and his family have considered and decided against chest tube. He worries about putting her through the procedure and worries that she would pull this tube out.  She is confused and pulls at everything he says. He does want to ensure that her pain is treated. He does share that they spoke with 2 different hospice agencies previously and he feels that he will need the support of hospice upon discharge from the hospital. We further discussed the benefits and philosophy of hospice care.   [name] notes that their daughter is en-route from [place] currently and he does not wish for me to call her while she is driving. Goals are clear for now. Updated Dr. Denton Brick. Emotional support provided.   Primary Decision Maker NEXT OF KIN [name]     SUMMARY OF RECOMMENDATIONS   - DNR - MOST form in electronic records - Plan for hospice at home after discharge  Code Status/Advance Care Planning:  DNR   Symptom Management:   Agree with dilaudid as needed for pain given many allergies. Recommend oral liquid dilaudid for symptom relief at home with hospice management.   Palliative Prophylaxis:   Aspiration, Bowel Regimen, Delirium Protocol, Frequent Pain Assessment, Oral Care and Turn Reposition   Prognosis:   < 6 months  Discharge Planning: Home with Hospice      Primary Diagnoses: Present on Admission: . Pneumothorax . Acute Hepatic encephalopathy . Dementia without behavioral disturbance ([place]) . Cirrhosis, non-alcoholic ([place]) . Thrombocytopenia ([place]) . Varices, esophageal ([place]) . Colon cancer ([place]) . Bladder cancer ([place]) . Anemia, iron deficiency   I have reviewed the medical record, interviewed the patient and family, and examined the patient. The following aspects are pertinent.  Past Medical History:  Diagnosis Date  . Anemia, iron deficiency   . Arthritis   . Bladder cancer [facility]) 2003   Bladder removed - [name]  . Cataract immature   . Cirrhosis ([place]) 08/26/2011  . Colon cancer ([place]) 2001  Hemicolectomy - [name]  . Depression   . GERD (gastroesophageal reflux disease)   . Headache(784.0)   . Hemorrhoids   . History of colon polyps   . History of DVT (deep vein thrombosis) 50+yrs ago   With pregnancy  . History of GI bleed   . History of kidney stones   . History of urostomy    Urostomy d/t hx bladder cancer  . Hx of transfusion of whole blood   . Hypertension   . Joint pain   . Memory loss    Chemotherapy; short and long term  . Osteoporosis   . Thrombocytopenia ([place])    Social History   Socioeconomic History  . Marital status: Married    Spouse name: Not on file  . Number of children: Not on file  . Years of education: Not on file  . Highest education level: Not on file   Occupational History  . Not on file  Tobacco Use  . Smoking status: Former Smoker    Packs/day: 0.50    Years: 35.00    Pack years: 17.50    Types: Cigarettes    Start date: 10/25/1955    Quit date: 09/22/1991    Years since quitting: 29.0  . Smokeless tobacco: Never Used  . Tobacco comment: quit 23years ago  Vaping Use  . Vaping Use: Never used  Substance and Sexual Activity  . Alcohol use: No    Alcohol/week: 0.0 standard drinks  . Drug use: No  . Sexual activity: Yes    Birth control/protection: Surgical  Other Topics Concern  . Not on file  Social History Narrative   Lives at home with her husband, ambulatory   Social Determinants of Health   Financial Resource Strain: Not on file  Food Insecurity: Not on file  Transportation Needs: Not on file  Physical Activity: Not on file  Stress: Not on file  Social Connections: Not on file   Family History  Problem Relation Age of Onset  . Diabetes Brother   . Cancer Other   . Hypertension Other    Scheduled Meds: . lactulose  20 g Oral TID  . mirtazapine  15 mg Oral QHS  . OLANZapine zydis  5 mg Oral Daily  . sodium chloride flush  3 mL Intravenous Q12H  . sodium chloride flush  3 mL Intravenous Q12H  . sodium chloride flush  3 mL Intravenous Q12H   Continuous Infusions: . sodium chloride    . sodium chloride     PRN Meds:.sodium chloride, sodium chloride, acetaminophen **OR** acetaminophen, albuterol, antiseptic oral rinse, bisacodyl, diphenhydrAMINE, glycopyrrolate **OR** glycopyrrolate **OR** glycopyrrolate, HYDROmorphone (DILAUDID) injection, LORazepam, magic mouthwash w/lidocaine, ondansetron **OR** ondansetron (ZOFRAN) IV, polyvinyl alcohol, sodium chloride flush, sodium chloride flush, traZODone Allergies  Allergen Reactions  . Bee Venom Anaphylaxis  . Codeine Anaphylaxis, Rash and Shortness Of Breath  . Morphine And Related Anaphylaxis and Rash  . Nutritional Supplements Anaphylaxis  . Phytonadione Other  (See Comments)    Pt experienced an episode of cyanosis following dose of Vitamin K 10 mg IV.  Marland Kitchen Ceftin [Cefuroxime] Rash  . Penicillins Rash and Other (See Comments)    Has patient had a PCN reaction causing immediate rash, facial/tongue/throat swelling, SOB or lightheadedness with hypotension: No Has patient had a PCN reaction causing severe rash involving mucus membranes or skin necrosis: No Has patient had a PCN reaction that required hospitalization: No Has patient had a PCN reaction occurring within the last 10 years: No  If all of the above answers are "NO", then may proceed with Cephalosporin use.  . Promethazine Hcl Other (See Comments)    Reaction:  Unknown    Review of Systems  Unable to perform ROS: Dementia    Physical Exam Vitals and nursing note reviewed.  Constitutional:      General: She is not in acute distress.    Appearance: She is ill-appearing.     Comments: Temporal muscle wasting  Cardiovascular:     Rate and Rhythm: Normal rate.  Abdominal:     General: There is distension.     Palpations: Abdomen is soft.  Neurological:     Mental Status: She is alert. She is confused.     Vital Signs: BP 105/62   Pulse 91   Temp (!) 101.1 F (38.4 C) (Rectal)   Resp (!) 26   Ht 5' 3"  (1.6 m)   Wt 88.5 kg   SpO2 99%   BMI 34.54 kg/m  Pain Scale: PAINAD   Pain Score: Asleep   SpO2: SpO2: 99 % O2 Device:SpO2: 99 % O2 Flow Rate: .   IO: Intake/output summary: No intake or output data in the 24 hours ending 10/12/20 1546  LBM:   Baseline Weight: Weight: 88.5 kg Most recent weight: Weight: 88.5 kg     Palliative Assessment/Data:     Time In: 1500 Time Out: 1550 Time Total: 50 min Greater than 50%  of this time was spent counseling and coordinating care related to the above assessment and plan.  Signed by: Vinie Sill, NP Palliative Medicine Team Pager # [phone] (M-F 8a-5p) Team Phone # [phone] (Nights/Weekends)

## 2020-10-12 NOTE — ED Provider Notes (Signed)
[facility] Provider Note   CSN: [id number] Arrival date & time: 10/12/20  7619     History Chief Complaint  Patient presents with  . Chest Pain  . Abdominal Pain    [name] is a 80 y.o. female.  HPI   80 year old female with past medical history of dementia, remote colon/bladder cancer, cirrhosis, pancytopenia, recent admission for bleeding esophageal varices with banding presents to the emergency department with reported body aches.  Patient is demented, she is a very poor historian.  She states she does not know why she is here, she denies any pain at this time although she does look uncomfortable.  There was also possible reported chest pain per EMS from the family.  It appears the patient was discharged about 2 weeks ago from an admission for bleeding esophageal varices status post banding.  There was mention of possible palliative/hospice care at that time.  I am unable to reach any family at the listed numbers in the chart.  Past Medical History:  Diagnosis Date  . Anemia, iron deficiency   . Arthritis   . Bladder cancer [facility]) 2003   Bladder removed - [name]  . Cataract immature   . Cirrhosis ([place]) 08/26/2011  . Colon cancer [facility]) 2001   Hemicolectomy - [name]  . Depression   . GERD (gastroesophageal reflux disease)   . Headache(784.0)   . Hemorrhoids   . History of colon polyps   . History of DVT (deep vein thrombosis) 50+yrs ago   With pregnancy  . History of GI bleed   . History of kidney stones   . History of urostomy    Urostomy d/t hx bladder cancer  . Hx of transfusion of whole blood   . Hypertension   . Joint pain   . Memory loss    Chemotherapy; short and long term  . Osteoporosis   . Thrombocytopenia [facility])     Patient Active Problem List   Diagnosis Date Noted  . Hematemesis 09/19/2020  . Hyperkalemia 09/19/2020  . GI bleed 09/19/2020  . Pneumothorax on left   . Acute respiratory failure with hypoxia ([place])  10/12/2019  . Acute lower UTI   . Pancytopenia ([place])   . Community acquired pneumonia   . Hypokalemia 11/02/2016  . Acute encephalopathy 11/02/2016  . Vasovagal syncope 11/02/2016  . Near syncope 11/02/2016  . Essential hypertension 09/11/2016  . Renal stones 05/04/2014  . Nephrolithiasis 04/06/2014  . Presence of urostomy ([place]) 04/06/2014  . Hydronephrosis, left 02/28/2014  . E. coli pyelonephritis 02/28/2014  . Leukopenia 03/05/2012  . Varices, esophageal ([place]) 02/29/2012  . Acute upper GI bleeding 02/29/2012  . Gastric ulcer, acute with hemorrhage 02/29/2012  . Dementia without behavioral disturbance ([place]) 02/26/2012  . Cirrhosis, non-alcoholic ([place]) 50/93/2671  . Melena 02/26/2012  . Acute blood loss anemia 02/26/2012  . Diarrhea 02/26/2012  . Fracture of humerus 02/26/2012  . Ventral hernia without obstruction or gangrene 01/13/2012  . Colon cancer ([place]) 08/26/2011  . Bladder cancer ([place]) 08/26/2011  . Thrombocytopenia ([place]) 08/26/2011  . Anemia, iron deficiency 08/26/2011    Past Surgical History:  Procedure Laterality Date  . ABDOMINAL HYSTERECTOMY    . APPENDECTOMY    . BLADDER SURGERY     bladder removed d/t cancer  . CHOLECYSTECTOMY    . COLON RESECTION     with colostomy then colostomy reversal  . COLON SURGERY  2004  . COLON SURGERY  2001  . COLONOSCOPY    .  ESOPHAGEAL BANDING  09/19/2020   Procedure: ESOPHAGEAL BANDING;  Surgeon: [name], MD;  Location[facility] ENDOSCOPY;  Service: Gastroenterology;;  . ESOPHAGOGASTRODUODENOSCOPY  02/27/2012   Procedure: ESOPHAGOGASTRODUODENOSCOPY (EGD);  Surgeon: [name], MD;  Location: [name] ENDOSCOPY;  Service: Endoscopy;  Laterality: N/A;  bedside case  . ESOPHAGOGASTRODUODENOSCOPY N/A 09/19/2020   Procedure: ESOPHAGOGASTRODUODENOSCOPY (EGD);  Surgeon: [name], MD;  Location: [facility];  Service: Gastroenterology;  Laterality: N/A;  . NEPHROLITHOTOMY Left 05/04/2014   Procedure: LEFT PERCUTANEOUS NEPHROLITHOTOMY  ;  Surgeon: [name], MD;  Location: [facility] ORS;  Service: Urology;  Laterality: Left;  . TOTAL KNEE ARTHROPLASTY  2010   Left knee     OB History   No obstetric history on file.     Family History  Problem Relation Age of Onset  . Diabetes Brother   . Cancer Other   . Hypertension Other     Social History   Tobacco Use  . Smoking status: Former Smoker    Packs/day: 0.50    Years: 35.00    Pack years: 17.50    Types: Cigarettes    Start date: 10/25/1955    Quit date: 09/22/1991    Years since quitting: 29.0  . Smokeless tobacco: Never Used  . Tobacco comment: quit 23years ago  Vaping Use  . Vaping Use: Never used  Substance Use Topics  . Alcohol use: No    Alcohol/week: 0.0 standard drinks  . Drug use: No    Home Medications Prior to Admission medications   Medication Sig Start Date End Date Taking? Authorizing Provider  acidophilus (RISAQUAD) CAPS capsule Take 1 capsule by mouth daily.    [provider]  Apoaequorin 20 MG CAPS Take 20 mg by mouth daily.    [provider]  Ascorbic Acid (VITAMIN C) 1000 MG tablet Take 1,000 mg by mouth daily.    [provider]  cholecalciferol (VITAMIN D) 1000 units tablet Take 1,000 Units by mouth daily.    [provider]  citalopram (CELEXA) 40 MG tablet TAKE 1 TABLET BY MOUTH EVERY DAY Patient taking differently: Take 40 mg by mouth daily. 05/17/19   [name], MD  Ginkgo Biloba 60 MG TABS Take 60 mg by mouth daily.     [provider]  lactulose (CHRONULAC) 10 GM/15ML solution Take 30 mLs (20 g total) by mouth 3 (three) times daily. Titrate to 2-3 large BMs/day 09/25/20   Geradine Girt, DO  levofloxacin (LEVAQUIN) 500 MG tablet Take 1 tablet (500 mg total) by mouth daily. 09/28/20   [name], MD  magic mouthwash w/lidocaine SOLN Take 5 mLs by mouth 3 (three) times daily as needed for mouth pain. 08/15/19   [place], Holli Humbles, NP  Multiple Vitamin (MULTIVITAMIN WITH  MINERALS) TABS tablet Take 1 tablet by mouth daily.    [provider]  pantoprazole (PROTONIX) 40 MG tablet Take 1 tablet (40 mg total) by mouth 2 (two) times daily. 09/25/20   Geradine Girt, DO  vitamin B-12 (CYANOCOBALAMIN) 1000 MCG tablet Take 1,000 mcg by mouth daily.    [provider]    Allergies    Bee venom, Codeine, Morphine and related, Nutritional supplements, Phytonadione, Ceftin [cefuroxime], Penicillins, and Promethazine hcl  Review of Systems   Review of Systems  Unable to perform ROS: Dementia    Physical Exam Updated Vital Signs BP (!) 124/52   Pulse 90   Temp 98.7 F (37.1 C) (Oral)   Resp Marland Kitchen)  21   Ht 5' 3"  (1.6 m)   Wt 88.5 kg   SpO2 96%   BMI 34.54 kg/m   Physical Exam Vitals and nursing note reviewed.  Constitutional:      General: She is not in acute distress.    Appearance: Normal appearance.  HENT:     Head: Normocephalic.     Mouth/Throat:     Mouth: Mucous membranes are moist.  Eyes:     Pupils: Pupils are equal, round, and reactive to light.  Cardiovascular:     Rate and Rhythm: Normal rate.  Pulmonary:     Effort: Pulmonary effort is normal. No respiratory distress.     Breath sounds: Examination of the right-lower field reveals decreased breath sounds. Examination of the left-lower field reveals decreased breath sounds. Decreased breath sounds present.  Abdominal:     Comments: Abdomen appears slightly distended, it is soft to palpation, intermittent tenderness elicited but inconsistent exam  Skin:    General: Skin is warm.  Neurological:     Mental Status: She is alert. Mental status is at baseline.     Comments: Oriented to self, mostly follows commands     ED Results / Procedures / Treatments   Labs (all labs ordered are listed, but only abnormal results are displayed) Labs Reviewed  CBC WITH DIFFERENTIAL/PLATELET  COMPREHENSIVE METABOLIC PANEL  URINALYSIS, ROUTINE W REFLEX MICROSCOPIC    EKG EKG  Interpretation  Date/Time:  Friday October 12 2020 06:20:39 EST Ventricular Rate:  90 PR Interval:    QRS Duration: 137 QT Interval:  424 QTC Calculation: 519 R Axis:   21 Text Interpretation: Sinus rhythm Ventricular premature complex Borderline prolonged PR interval Right bundle branch block NSR, PVC, RBBB unchanged Confirmed by Lavenia Atlas ([id number]) on 10/12/2020 7:32:52 AM   Radiology No results found.  Procedures Procedures (including critical care time)  Medications Ordered in ED Medications - No data to display  ED Course  I have reviewed the triage vital signs and the nursing notes.  Pertinent labs & imaging results that were available during my care of the patient were reviewed by me and considered in my medical decision making (see chart for details).    MDM Rules/Calculators/A&P                          80 year old female presents with reported chest pain.  She is found to have a small pneumothorax.  Patient had a pneumothorax on this side of the previous admission.  Consulted cardiothoracic who recommend repeat x-ray in 4 to 6 hours but patient will most likely require a chest tube.  Spoke in depth with the patient's family and they are goals of care.  At this time they choose to make the patient comfortable and not pursue aggressive care like a chest tube.  Hospitalist consulted and will admit with hospice/palliative consult.  Patient stable at time of admission.  Final Clinical Impression(s) / ED Diagnoses Final diagnoses:  None    Rx / DC Orders ED Discharge Orders    None       Lorelle Gibbs, DO 10/16/20 1636

## 2020-10-12 NOTE — Progress Notes (Signed)
Pt arrived to room 329 via stretcher from ED. Pt moved to bed by staff, pt tolerated well. Pt oriented to person only, husband at bedside. VSS at present. Resp unlabored, skin warm and dry.  Urostomy bag emptied of 200 ml pink tinged urine. IVF infusing without s/s infiltration.

## 2020-10-12 NOTE — ED Triage Notes (Signed)
EMS called for chest pain by pts family. Pt c/o body aches and just not feeling good. Pt has extended abd on arrival.

## 2020-10-12 NOTE — H&P (Signed)
Patient Demographics:    [name], is a 80 y.o. female  MRN: [id number]   DOB - Apr 16, 1941  Admit Date - 10/12/2020  Outpatient Primary MD for the patient is [name], MD   Assessment & Plan:    Principal Problem:   Pneumothorax Active Problems:   Acute Hepatic encephalopathy   Colon cancer ([name])   Bladder cancer ([name])   Thrombocytopenia ([name])   Anemia, iron deficiency   Dementia without behavioral disturbance (HCC)   Cirrhosis, non-alcoholic (HCC)   Varices, esophageal (HCC)  A/p  1)Left-sided pneumothorax with left-sided chest pains----chest x-ray shows apical and lateral pneumothorax on the left without tension component.  -Patient's husband and family declines chest tube -Pain control with narcotic, supplemental oxygen  2)large esophageal varices--with history of recurrent GI bleed--in the setting of nonalcoholic liver cirrhosis-- -During recent hospitalization patient had EGD: Total of seven bands were deployed out of which 6 stayed in place. Evidence of fibrin plug noted in one of the varix,where the band fell off,but was immediately treated with placement of another band for control of bleeding. -Continue Protonix -Hemoglobin is 8.6 which is close to patient's recent baseline, platelets 52 which is close to patient's recent baseline  3) nonalcoholic liver cirrhosis----manage as above #2, -- Give lactulose as ordered -Hold off on diuretics due to soft BP and poor oral intake  4)Dementia-with significant cognitive and memory deficits at baseline --Patient remains confused and  5)Social/ethics--palliative care consult appreciated, she is a DNI DNR -Family would like to keep patient comfortable -They are hoping to discharge home with hospice in the next day or 2  6)Febrile  illness-- ?? SBP--- IV Cipro empirically (beta-lactam allergy) pending cultures  7) dehydration and soft BP--- D5 solution until oral intake is more reliable   Disposition/Need for in-Hospital Stay- patient unable to be discharged at this time due to --- left-sided pneumothorax requiring iv pain control,  -- IV fluids until awake enough to take oral intake  Status is: Inpatient  Remains inpatient appropriate because:Please see above   Dispo: The patient is from: Home              Anticipated d/c is to: Home with hospice              Anticipated d/c date is: 2 days              Patient currently is not medically stable to d/c. Barriers: Not Clinically Stable-    With History of - Reviewed by me  Past Medical History:  Diagnosis Date  . Anemia, iron deficiency   . Arthritis   . Bladder cancer [name]) 2003   Bladder removed - [name]  . Cataract immature   . Cirrhosis ([name]) 08/26/2011  . Colon cancer [name]) 2001   Hemicolectomy - [name]  . Depression   . GERD (gastroesophageal reflux disease)   . Headache(784.0)   . Hemorrhoids   .  History of colon polyps   . History of DVT (deep vein thrombosis) 50+yrs ago   With pregnancy  . History of GI bleed   . History of kidney stones   . History of urostomy    Urostomy d/t hx bladder cancer  . Hx of transfusion of whole blood   . Hypertension   . Joint pain   . Memory loss    Chemotherapy; short and long term  . Osteoporosis   . Thrombocytopenia ([place])       Past Surgical History:  Procedure Laterality Date  . ABDOMINAL HYSTERECTOMY    . APPENDECTOMY    . BLADDER SURGERY     bladder removed d/t cancer  . CHOLECYSTECTOMY    . COLON RESECTION     with colostomy then colostomy reversal  . COLON SURGERY  2004  . COLON SURGERY  2001  . COLONOSCOPY    . ESOPHAGEAL BANDING  09/19/2020   Procedure: ESOPHAGEAL BANDING;  Surgeon: [name], MD;  Location: [facility] END[facility]COPY;  Service: Gastroenterology;;  .  ESOPHAGOGASTRODUODENOSCOPY  02/27/2012   Procedure: ESOPHAGOGASTRODUODENOSCOPY (EGD);  Surgeon: [name], MD;  Location: [name];  Service: Endoscopy;  Laterality: N/A;  bedside case  . ESOPHAGOGASTRODUODENOSCOPY N/A 09/19/2020   Procedure: ESOPHAGOGASTRODUODENOSCOPY (EGD);  Surgeon: [name], MD;  Location: [place];  Service: Gastroenterology;  Laterality: N/A;  . NEPHROLITHOTOMY Left 05/04/2014   Procedure: LEFT PERCUTANEOUS NEPHROLITHOTOMY ;  Surgeon: [name], MD;  Location: [facility] ORS;  Service: Urology;  Laterality: Left;  . TOTAL KNEE ARTHROPLASTY  2010   Left knee      Chief Complaint  Patient presents with  . Chest Pain  . Abdominal Pain      HPI:    [name]  is a 80 y.o. female with medical history significant fordementia, remote stage II colon cancer, bladder cancer, nonalcoholic Liver cirrhosis, chronic pancytopenia and recurrent GI bleed, and history of recent pneumothorax that resolved spontaneously during recent admission--presents to the ED by EMS with concerns for chest pain, body aches and just not feeling well -Chest x-ray showed- Apical and lateral pneumothorax on the left without tension component.  -Hemoglobin 8.6 , platelets 52 -Additional history obtained from patient's husband at bedside -Palliative care provider consult appreciated -Patient remains confused and disoriented  -Recently admitted to the hospital from 09/18/2020 through 09/25/2020  --They are hoping to discharge home with hospice in the next day or 2   Review of systems:    In addition to the HPI above,   A full Review of  Systems was done, all other systems reviewed are negative except as noted above in HPI , .    Social History:  Reviewed by me    Social History   Tobacco Use  . Smoking status: Former Smoker    Packs/day: 0.50    Years: 35.00    Pack years: 17.50    Types: Cigarettes    Start date: 10/25/1955    Quit date: 09/22/1991    Years since  quitting: 29.0  . Smokeless tobacco: Never Used  . Tobacco comment: quit 23years ago  Substance Use Topics  . Alcohol use: No    Alcohol/week: 0.0 standard drinks     Family History :  Reviewed by me    Family History  Problem Relation Age of Onset  . Diabetes Brother   . Cancer Other   . Hypertension Other      Home Medications:   Prior to Admission medications  Medication Sig Start Date End Date Taking? Authorizing Provider  citalopram (CELEXA) 40 MG tablet TAKE 1 TABLET BY MOUTH EVERY DAY Patient taking differently: Take 20 mg by mouth daily. 05/17/19  Yes [name], MD  lactulose (CHRONULAC) 10 GM/15ML solution Take 30 mLs (20 g total) by mouth 3 (three) times daily. Titrate to 2-3 large BMs/day 09/25/20  Yes Vann, Tomi Bamberger, DO  magic mouthwash w/lidocaine SOLN Take 5 mLs by mouth 3 (three) times daily as needed for mouth pain. 08/15/19  Yes [place], Holli Humbles, NP  pantoprazole (PROTONIX) 40 MG tablet Take 1 tablet (40 mg total) by mouth 2 (two) times daily. 09/25/20  Yes Vann, Jessica U, DO  acidophilus (RISAQUAD) CAPS capsule Take 1 capsule by mouth daily. Patient not taking: No sig reported    [provider]  Apoaequorin 20 MG CAPS Take 20 mg by mouth daily. Patient not taking: No sig reported    [provider]  Ascorbic Acid (VITAMIN C) 1000 MG tablet Take 1,000 mg by mouth daily. Patient not taking: No sig reported    [provider]  cholecalciferol (VITAMIN D) 1000 units tablet Take 1,000 Units by mouth daily. Patient not taking: No sig reported    [provider]  Ginkgo Biloba 60 MG TABS Take 60 mg by mouth daily.  Patient not taking: No sig reported    [provider]  levofloxacin (LEVAQUIN) 500 MG tablet Take 1 tablet (500 mg total) by mouth daily. Patient not taking: No sig reported 09/28/20   [name], MD  Multiple Vitamin (MULTIVITAMIN WITH MINERALS) TABS tablet Take 1 tablet by mouth  daily. Patient not taking: No sig reported    [provider]  vitamin B-12 (CYANOCOBALAMIN) 1000 MCG tablet Take 1,000 mcg by mouth daily. Patient not taking: No sig reported    [provider]     Allergies:     Allergies  Allergen Reactions  . Bee Venom Anaphylaxis  . Codeine Anaphylaxis, Rash and Shortness Of Breath  . Morphine And Related Anaphylaxis and Rash  . Nutritional Supplements Anaphylaxis  . Phytonadione Other (See Comments)    Pt experienced an episode of cyanosis following dose of Vitamin K 10 mg IV.  Marland Kitchen Ceftin [Cefuroxime] Rash  . Penicillins Rash and Other (See Comments)    Has patient had a PCN reaction causing immediate rash, facial/tongue/throat swelling, SOB or lightheadedness with hypotension: No Has patient had a PCN reaction causing severe rash involving mucus membranes or skin necrosis: No Has patient had a PCN reaction that required hospitalization: No Has patient had a PCN reaction occurring within the last 10 years: No If all of the above answers are "NO", then may proceed with Cephalosporin use.  . Promethazine Hcl Other (See Comments)    Reaction:  Unknown      Physical Exam:   Vitals  Blood pressure (!) 89/37, pulse 72, temperature (!) 101.1 F (38.4 C), temperature source Rectal, resp. rate 19, height 5' 3"  (1.6 m), weight 88.5 kg, SpO2 98 %.  Physical Examination: General appearance -chronically ill appearing  mental status -lethargic, sleepy , affect is flat Neck - supple, no JVD elevation , Chest -diminished on the left, no wheezing Heart - S1 and S2 normal, regular Abdomen - soft, nontender, distended, +BS neurological -generalized weakness, limited neuro exam as patient is lethargic confused and does not really follow instructions Extremities -   intact peripheral pulses  Skin - warm, dry  Data Review:    CBC Recent Labs  Lab 10/12/20 0815  WBC 5.6  HGB 8.6*  HCT 27.6*  PLT 52*  MCV 97.2  MCH 30.3   MCHC 31.2  RDW 15.9*  LYMPHSABS 0.2*  MONOABS 0.5  EOSABS 0.0  BASOSABS 0.0   ------------------------------------------------------------------------------------------------------------------  Chemistries  Recent Labs  Lab 10/12/20 0815  NA 138  K 4.3  CL 108  CO2 24  GLUCOSE 130*  BUN 18  CREATININE 0.82  CALCIUM 9.6  AST 28  ALT 20  ALKPHOS 80  BILITOT 1.8*   ------------------------------------------------------------------------------------------------------------------ estimated creatinine clearance is 56.7 mL/min (by C-G formula based on SCr of 0.82 mg/dL). ------------------------------------------------------------------------------------------------------------------ No results for input(s): TSH, T4TOTAL, T3FREE, THYROIDAB in the last 72 hours.  Invalid input(s): FREET3   Coagulation profile No results for input(s): INR, PROTIME in the last 168 hours. ------------------------------------------------------------------------------------------------------------------- No results for input(s): DDIMER in the last 72 hours. -------------------------------------------------------------------------------------------------------------------  Cardiac Enzymes No results for input(s): CKMB, TROPONINI, MYOGLOBIN in the last 168 hours.  Invalid input(s): CK ------------------------------------------------------------------------------------------------------------------ No results found for: BNP   Urinalysis    Component Value Date/Time   COLORURINE AMBER (A) 08/29/2020 1002   APPEARANCEUR TURBID (A) 08/29/2020 1002   LABSPEC 1.011 08/29/2020 1002   PHURINE 8.0 08/29/2020 1002   GLUCOSEU NEGATIVE 08/29/2020 1002   HGBUR NEGATIVE 08/29/2020 [place] 08/29/2020 1002   KETONESUR NEGATIVE 08/29/2020 1002   PROTEINUR 30 (A) 08/29/2020 1002   UROBILINOGEN 1.0 04/06/2014 1856   NITRITE NEGATIVE 08/29/2020 1002   LEUKOCYTESUR LARGE (A) 08/29/2020  1002     Imaging Results:    DG Abd Acute W/Chest  Result Date: 10/12/2020 CLINICAL DATA:  Chest pain. Reported history of colon bladder carcinoma EXAM: DG ABDOMEN ACUTE WITH 1 VIEW CHEST COMPARISON:  Chest radiograph October 01, 2020; CT abdomen and pelvis March 29, 2020 FINDINGS: AP chest: There is a focal left apical and lateral pneumothorax without tension component. There is a small pleural effusion on the left. There is atelectatic change in the right base region. No consolidation. Heart is mildly enlarged with pulmonary vascularity normal. There is aortic atherosclerosis. Supine and upright abdomen: There is moderate stool in the colon. There is no bowel dilatation or air-fluid level to suggest bowel obstruction. No free air. There are surgical clips in the right abdomen. IMPRESSION: Apical and lateral pneumothorax on the left without tension component. Small left pleural effusion. Atelectatic change right lower lung region. No findings indicative of bowel obstruction or free air. Postoperative change right abdomen. Critical Value/emergent results were called by telephone at the time of interpretation on 10/12/2020 at 8:59 am to provider [facility] , who verbally acknowledged these results. Electronically Signed   By: Lowella Grip III M.D.   On: 10/12/2020 08:59    Radiological Exams on Admission: DG Abd Acute W/Chest  Result Date: 10/12/2020 CLINICAL DATA:  Chest pain. Reported history of colon bladder carcinoma EXAM: DG ABDOMEN ACUTE WITH 1 VIEW CHEST COMPARISON:  Chest radiograph October 01, 2020; CT abdomen and pelvis March 29, 2020 FINDINGS: AP chest: There is a focal left apical and lateral pneumothorax without tension component. There is a small pleural effusion on the left. There is atelectatic change in the right base region. No consolidation. Heart is mildly enlarged with pulmonary vascularity normal. There is aortic atherosclerosis. Supine and upright abdomen: There is moderate  stool in the colon. There is no bowel dilatation or air-fluid level to suggest bowel obstruction. No free air.  There are surgical clips in the right abdomen. IMPRESSION: Apical and lateral pneumothorax on the left without tension component. Small left pleural effusion. Atelectatic change right lower lung region. No findings indicative of bowel obstruction or free air. Postoperative change right abdomen. Critical Value/emergent results were called by telephone at the time of interpretation on 10/12/2020 at 8:59 am to provider [facility] , who verbally acknowledged these results. Electronically Signed   By: Lowella Grip III M.D.   On: 10/12/2020 08:59   DVT Prophylaxis -SCD (platelets) AM Labs Ordered, also please review Full Orders  Family Communication: Admission, patients condition and plan of care including tests being ordered have been discussed with the patient and husband who indicate understanding and agree with the plan   Code Status -DNR  Likely DC to home with hospice  Condition   -overall prognosis is poor*  Roxan Hockey M.D on 10/12/2020 at 5:51 PM Go to [email or web address] -  for contact info  [facility] - Office  [phone]

## 2020-10-12 NOTE — Telephone Encounter (Signed)
Patient daughter wanted to discuss possible treatment options for pneumothorax.   Routed call to provider.

## 2020-10-12 NOTE — ED Notes (Signed)
Dr. Joesph Fillers aware of BP

## 2020-10-12 NOTE — Telephone Encounter (Signed)
[name] is the daughter of Jhanvi. mother was admit in the hosptial would like to speak with Dr. Dennard Schaumann about the treatment that hosptial given her

## 2020-10-13 DIAGNOSIS — D696 Thrombocytopenia, unspecified: Secondary | ICD-10-CM

## 2020-10-13 DIAGNOSIS — I8500 Esophageal varices without bleeding: Secondary | ICD-10-CM

## 2020-10-13 NOTE — Progress Notes (Signed)
Pt assisted up to chair with assist x2, still weak and unsteady on feet. Pt has moderate sized liquid brown BM. Bed linens changed, pt cleaned. Husband is feeding pt her jello and soup. Pt denies any c/o pain, no obvious signs of discomfort. Remains disoriented x4 but cooperative.

## 2020-10-13 NOTE — Progress Notes (Signed)
PROGRESS NOTE    [name]  OZH:[id number] DOB: 12/25/1940 DOA: 10/12/2020 PCP: Erica Frizzle, MD    Brief Narrative:  80 year old female with a history of dementia, remote history of colon cancer/bladder cancer, Nash cirrhosis, chronic pancytopenia, history of GI bleeding due to esophageal varices, recent history of pneumothorax which resolved spontaneously, presented to the hospital with complaints of chest pain.  Chest x-ray indicated apical lateral pneumothorax.  She was admitted for further treatment.   Assessment & Plan:   Principal Problem:   Pneumothorax Active Problems:   Colon cancer (HCC)   Bladder cancer (HCC)   Thrombocytopenia (HCC)   Anemia, iron deficiency   Dementia without behavioral disturbance (HCC)   Cirrhosis, non-alcoholic (HCC)   Varices, esophageal (HCC)   Acute Hepatic encephalopathy   Left-sided pneumothorax -Patient presented with left-sided chest pains -X-ray confirmed apical lateral pneumothorax -Overall oxygenation and respiratory effort appears to be stable -With patient's multiple comorbidities, thrombocytopenia, she would be a poor candidate for aggressive management such as chest tube -Family is in agreement does not wish to pursue aggressive measures  [name] cirrhosis with recent GI bleeding due to esophageal varices -Status post banding of varices during last hospitalization -Hemoglobin is currently stable  Thrombocytopenia -Related to liver disease -Increased risk of bleeding  Dementia -Patient remains confused at this time  Goals of care -Seen by palliative care and patient is currently DNR -After further discussion with the patient's husband and daughter [name], her multiple comorbidities, poor nutritional/functional status and expected prognosis were discussed -Her long-term prognosis remains poor and she would be an appropriate candidate for hospice -Family is in agreement and wished to take her home with hospice  services -They will try to make arrangements to have additional resources at home to help care for her at home -They agree to focus patient's care towards her quality of life and comfort -She may eventually transition to residential hospice, but they would like to try at home -They requested to speak with [name] before being discharged home -We will also continue to work on pain management/management of shortness of breath prior to discharge    DVT prophylaxis: none comfort measures  Code Status: DNR, comfort measures Family Communication: discussed with husband at bedside, daughter [name] over the phone Disposition Plan: Status is: Inpatient  Remains inpatient appropriate because:Unsafe d/c plan   Dispo: The patient is from: Home              Anticipated d/c is to: Home              Anticipated d/c date is: 1 day              Patient currently is not medically stable to d/c.   Difficult to place patient No         Consultants:   Palliative care  Procedures:     Antimicrobials:       Subjective: Patient is somnolent, does not open eyes to voice. Was awake earlier in the day  Objective: Vitals:   10/12/20 2204 10/13/20 0223 10/13/20 0605 10/13/20 0900  BP: (!) 89/47 (!) 115/54 (!) 108/97 (!) 108/33  Pulse: 85 93 (!) 103 79  Resp: 18 20 20 20   Temp: 98.2 F (36.8 C) 98 F (36.7 C) 98.5 F (36.9 C)   TempSrc: Oral Oral    SpO2: 93% 90% 90% 96%  Weight:      Height:        Intake/Output Summary (Last 24  hours) at 10/13/2020 1727 Last data filed at 10/13/2020 1130 Gross per 24 hour  Intake 1258.75 ml  Output 550 ml  Net 708.75 ml   Filed Weights   10/12/20 0616  Weight: 88.5 kg    Examination:  General exam: sleeping, does not open eyes to voice Respiratory system: Clear to auscultation. Respiratory effort normal. Cardiovascular system: S1 & S2 heard, RRR. No JVD, murmurs, rubs, gallops or clicks. No pedal edema. Gastrointestinal  system: Abdomen is nondistended, soft and nontender. No organomegaly or masses felt. Normal bowel sounds heard. Central nervous system: No focal neurological deficits. Extremities: Symmetric  Skin: No rashes, lesions or ulcers Psychiatry: somnolent    Data Reviewed: I have personally reviewed following labs and imaging studies  CBC: Recent Labs  Lab 10/12/20 0815  WBC 5.6  NEUTROABS 4.8  HGB 8.6*  HCT 27.6*  MCV 97.2  PLT 52*   Basic Metabolic Panel: Recent Labs  Lab 10/12/20 0815  NA 138  K 4.3  CL 108  CO2 24  GLUCOSE 130*  BUN 18  CREATININE 0.82  CALCIUM 9.6   GFR: Estimated Creatinine Clearance: 56.7 mL/min (by C-G formula based on SCr of 0.82 mg/dL). Liver Function Tests: Recent Labs  Lab 10/12/20 0815  AST 28  ALT 20  ALKPHOS 80  BILITOT 1.8*  PROT 5.0*  ALBUMIN 2.6*   No results for input(s): LIPASE, AMYLASE in the last 168 hours. No results for input(s): AMMONIA in the last 168 hours. Coagulation Profile: No results for input(s): INR, PROTIME in the last 168 hours. Cardiac Enzymes: No results for input(s): CKTOTAL, CKMB, CKMBINDEX, TROPONINI in the last 168 hours. BNP (last 3 results) No results for input(s): PROBNP in the last 8760 hours. HbA1C: No results for input(s): HGBA1C in the last 72 hours. CBG: No results for input(s): GLUCAP in the last 168 hours. Lipid Profile: No results for input(s): CHOL, HDL, LDLCALC, TRIG, CHOLHDL, LDLDIRECT in the last 72 hours. Thyroid Function Tests: No results for input(s): TSH, T4TOTAL, FREET4, T3FREE, THYROIDAB in the last 72 hours. Anemia Panel: No results for input(s): VITAMINB12, FOLATE, FERRITIN, TIBC, IRON, RETICCTPCT in the last 72 hours. Sepsis Labs: No results for input(s): PROCALCITON, LATICACIDVEN in the last 168 hours.  Recent Results (from the past 240 hour(s))  SARS Coronavirus 2 by RT PCR (hospital order, performed in [facility] hospital lab) Nasopharyngeal Nasopharyngeal Swab      Status: None   Collection Time: 10/12/20  8:12 AM   Specimen: Nasopharyngeal Swab  Result Value Ref Range Status   SARS Coronavirus 2 NEGATIVE NEGATIVE Final    Comment: (NOTE) SARS-CoV-2 target nucleic acids are NOT DETECTED.  The SARS-CoV-2 RNA is generally detectable in upper and lower respiratory specimens during the acute phase of infection. The lowest concentration of SARS-CoV-2 viral copies this assay can detect is 250 copies / mL. A negative result does not preclude SARS-CoV-2 infection and should not be used as the sole basis for treatment or other patient management decisions.  A negative result may occur with improper specimen collection / handling, submission of specimen other than nasopharyngeal swab, presence of viral mutation(s) within the areas targeted by this assay, and inadequate number of viral copies (<250 copies / mL). A negative result must be combined with clinical observations, patient history, and epidemiological information.  Fact Sheet for Patients:   StrictlyIdeas.no  Fact Sheet for Healthcare Providers: BankingDealers.co.za  This test is not yet approved or  cleared by the [place] FDA and has  been authorized for detection and/or diagnosis of SARS-CoV-2 by FDA under an Emergency Use Authorization (EUA).  This EUA will remain in effect (meaning this test can be used) for the duration of the COVID-19 declaration under Section 564(b)(1) of the Act, 21 U.S.C. section 360bbb-3(b)(1), unless the authorization is terminated or revoked sooner.  Performed at [facility], [address] [place], [place] [address]          Radiology Studies: DG Abd Acute W/Chest  Result Date: 10/12/2020 CLINICAL DATA:  Chest pain. Reported history of colon bladder carcinoma EXAM: DG ABDOMEN ACUTE WITH 1 VIEW CHEST COMPARISON:  Chest radiograph October 01, 2020; CT abdomen and pelvis March 29, 2020 FINDINGS: AP chest:  There is a focal left apical and lateral pneumothorax without tension component. There is a small pleural effusion on the left. There is atelectatic change in the right base region. No consolidation. Heart is mildly enlarged with pulmonary vascularity normal. There is aortic atherosclerosis. Supine and upright abdomen: There is moderate stool in the colon. There is no bowel dilatation or air-fluid level to suggest bowel obstruction. No free air. There are surgical clips in the right abdomen. IMPRESSION: Apical and lateral pneumothorax on the left without tension component. Small left pleural effusion. Atelectatic change right lower lung region. No findings indicative of bowel obstruction or free air. Postoperative change right abdomen. Critical Value/emergent results were called by telephone at the time of interpretation on 10/12/2020 at 8:59 am to provider [facility] , who verbally acknowledged these results. Electronically Signed   By: Lowella Grip III M.D.   On: 10/12/2020 08:59        Scheduled Meds: . lactulose  20 g Oral TID  . mirtazapine  15 mg Oral QHS  . OLANZapine zydis  5 mg Oral Daily  . sodium chloride flush  3 mL Intravenous Q12H  . sodium chloride flush  3 mL Intravenous Q12H  . sodium chloride flush  3 mL Intravenous Q12H   Continuous Infusions: . sodium chloride    . sodium chloride       LOS: 1 day    Time spent: 56mns    JKathie Dike MD [facility]   If 7PM-7AM, please contact night-coverage [email or web address]  10/13/2020, 5:27 PM

## 2020-10-13 NOTE — Progress Notes (Signed)
Pt moved to room # 317. Belongings moved with pt. Husband aware and agreeable.

## 2020-10-13 NOTE — TOC Initial Note (Signed)
Transition of Care [facility]) - Initial/Assessment Note    Patient Details  Name: [name] MRN: [id number] Date of Birth: [birth year]  [facility]) CM/SW Contact:    Natasha Bence, LCSW Phone Number: 10/13/2020, 7:15 PM  Clinical Narrative:                 Patient is an 80 year old female admitted for Pneumothorax. CSW notified of family's inquiry for home hospice. CSW spoke with patient's daughter [name]. Patient's daughter initially reported that she would like to confirm the option of home hospice with her father before making any decisions. PPatient's daughter reported that they would prefer to have [facility] provide services and that if referred, her mother would not need any additional medical equipment. MD later reported that patient's family now ready for hospice referral. CSW placed home hospice referral with [facility] hosp[facility]. [facility] agreeable to provide services to patient.   Expected Discharge Plan: Home w Hospice Care Barriers to Discharge: Continued Medical Work up   Patient Goals and CMS Choice     Choice offered to / list presented to : [place]  Expected Discharge Plan and Services Expected Discharge Plan: [place] Acute Care Choice: NA Living arrangements for the past 2 months: Single Family Home                 DME Arranged: N/A DME Agency: NA       HH Arranged: NA HH Agency: NA        Prior Living Arrangements/Services Living arrangements for the past 2 months: [place] with:: Self,Spouse Patient language and need for interpreter reviewed:: Yes Do you feel safe going back to the place where you live?: Yes      Need for Family Participation in Patient Care: Yes (Comment) Care giver support system in place?: Yes (comment)   Criminal Activity/Legal Involvement Pertinent to Current Situation/Hospitalization: No - Comment as needed  Activities of Daily Living Home Assistive  Devices/Equipment: Dentures (specify type),Walker (specify type),Wheelchair,Blood pressure cuff,Bedside commode/3-in-1,Hospital bed ADL Screening (condition at time of admission) Patient's cognitive ability adequate to safely complete daily activities?: No Is the patient deaf or have difficulty hearing?: Yes Does the patient have difficulty seeing, even when wearing glasses/contacts?: No Does the patient have difficulty concentrating, remembering, or making decisions?: Yes Patient able to express need for assistance with ADLs?: No Does the patient have difficulty dressing or bathing?: Yes Independently performs ADLs?: No Communication: Independent Dressing (OT): Dependent Is this a change from baseline?: Pre-admission baseline Grooming: Dependent Is this a change from baseline?: Pre-admission baseline Feeding: Needs assistance Is this a change from baseline?: Pre-admission baseline Bathing: Dependent Is this a change from baseline?: Pre-admission baseline Toileting: Dependent Is this a change from baseline?: Pre-admission baseline In/Out Bed: Dependent Is this a change from baseline?: Pre-admission baseline Walks in Home: Independent with device (comment) Does the patient have difficulty walking or climbing stairs?: Yes Weakness of Legs: Both Weakness of Arms/Hands: None  Permission Sought/Granted   Permission granted to share information with : Yes, Verbal Permission Granted  Share Information with NAME: Pelagia, Iacobucci Daughter     [phone]  Permission granted to share info w AGENCY: [facility] granted to share info w Relationship: Spous, Daughter  Permission granted to share info w Contact Information: [phone], [phone]  Emotional Assessment       Orientation: : Oriented to Self  Alcohol / Substance Use: Not Applicable Psych Involvement: No (comment)  Admission diagnosis:  Pneumothorax [J93.9] Pneumothorax, unspecified  type [J93.9] Patient Active Problem List   Diagnosis Date Noted  . Pneumothorax 10/12/2020  . Hematemesis 09/19/2020  . Hyperkalemia 09/19/2020  . GI bleed 09/19/2020  . Pneumothorax on left   . Acute respiratory failure with hypoxia ([place]) 10/12/2019  . Acute lower UTI   . Pancytopenia ([place])   . Community acquired pneumonia   . Hypokalemia 11/02/2016  . Acute Hepatic encephalopathy 11/02/2016  . Vasovagal syncope 11/02/2016  . Near syncope 11/02/2016  . Essential hypertension 09/11/2016  . Renal stones 05/04/2014  . Nephrolithiasis 04/06/2014  . 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 Hydronephrosis, left 02/28/2014  . E. coli pyelonephritis 02/28/2014  . Leukopenia 03/05/2012  . Varices, esophageal ([place]) 02/29/2012  . Acute upper GI bleeding 02/29/2012  . Gastric ulcer, acute with hemorrhage 02/29/2012  . Dementia without behavioral disturbance ([place]) 02/26/2012  . Cirrhosis, non-alcoholic ([place]) 41/11/129  . Melena 02/26/2012  . Acute blood loss anemia 02/26/2012  . Diarrhea 02/26/2012  . Fracture of humerus 02/26/2012  . Ventral hernia without obstruction or gangrene 01/13/2012  . Colon cancer ([place]) 08/26/2011  . Bladder cancer ([place]) 08/26/2011  . Thrombocytopenia ([place]) 08/26/2011  . Anemia, iron deficiency 08/26/2011   PCP:  [name], MD Pharmacy:   [facility] [place], [place] - [address] [place] [address] [place] [address] Phone: [phone] Fax: [phone]     Social Determinants of Health (SDOH) Interventions    Readmission Risk Interventions Readmission Risk Prevention Plan 10/13/2020 09/21/2020  Transportation Screening Complete Complete  PCP or Specialist Appt within 3-5 Days Complete -  HRI or [place] Complete Complete  Social Work Consult for [place] Planning/Counseling Complete Complete  Palliative Care Screening Complete Complete  Medication Review Designer, fashion/clothing) Complete Complete  Some recent data might be hidden

## 2020-10-13 NOTE — Progress Notes (Signed)
Pt able to get OOB to chair for bed change with 2 assists. Weak and unsteady on feet. Pt back to bed without difficulty. Pt took juice x2 and am medications without difficulty. Pt was complaining of back pain and holding left side of chest. No increased SOB, no respiratory difficulty. Toradol 34m IV given per order with good relief per patient. Pt now awake, talkative but remains confused, disoriented X4. Daughter remains at bedside.

## 2020-10-13 NOTE — Progress Notes (Signed)
Pt has been sleeping on each round this am, awakens to name called. No verbal response. Resp even and non-labored, snoring at times, SaO2 96%. Chest CTA, heart rate regular. Skin warm and dry. Urostomy emptied of 150 ml clear amber urine. IVF infusing without s/s infiltration. Daughter at bedside and updated on plan of care.

## 2020-10-14 NOTE — Progress Notes (Signed)
PROGRESS NOTE    [name]  ERX:[id number] DOB: 1941/03/10 DOA: 10/12/2020 PCP: [name], MD    Brief Narrative:  80 year old female with a history of dementia, remote history of colon cancer/bladder cancer, Nash cirrhosis, chronic pancytopenia, history of GI bleeding due to esophageal varices, recent history of pneumothorax which resolved spontaneously, presented to the hospital with complaints of chest pain.  Chest x-ray indicated apical lateral pneumothorax.  She was admitted for further treatment.   Assessment & Plan:   Principal Problem:   Pneumothorax Active Problems:   Colon cancer (HCC)   Bladder cancer (HCC)   Thrombocytopenia (HCC)   Anemia, iron deficiency   Dementia without behavioral disturbance (HCC)   Cirrhosis, non-alcoholic (HCC)   Varices, esophageal (HCC)   Acute Hepatic encephalopathy   Left-sided pneumothorax -Patient presented with left-sided chest pains -X-ray confirmed apical lateral pneumothorax -Overall oxygenation and respiratory effort appears to be stable -With patient's multiple comorbidities, thrombocytopenia, she would be a poor candidate for aggressive management such as chest tube -Family is in agreement and does not wish to pursue aggressive measures  Karlene Lineman cirrhosis with recent GI bleeding due to esophageal varices -Status post banding of varices during last hospitalization -Hemoglobin is currently stable  Thrombocytopenia -Related to liver disease -Increased risk of bleeding  Dementia -Patient remains confused at this time  Goals of care -Seen by palliative care and patient is currently DNR -After further discussion with the patient's husband and daughter [name], her multiple comorbidities, poor nutritional/functional status and expected prognosis were discussed -Her long-term prognosis remains poor and she would be an appropriate candidate for hospice -Family is in agreement and wished to take her home with hospice  services -They will try to make arrangements to have additional resources at home to help care for her at home -They agree to focus patient's care towards her quality of life and comfort -She may eventually transition to residential hospice, but they would like to try at home for now -Family has spoken to [facility] hosp[facility] who will care for patient in the home -Patient's daughter will be coming to [facility] tomorrow to stay with patient and her husband in order to help care for patient -We will likely plan on discharge tomorrow once family has adequate help at home to care for patient    DVT prophylaxis: none, comfort measures  Code Status: DNR, comfort measures Family Communication: discussed with husband at bedside, daughter sandra over the phone Disposition Plan: Status is: Inpatient  Remains inpatient appropriate because:Unsafe d/c plan   Dispo: The patient is from: Home              Anticipated d/c is to: Home              Anticipated d/c date is: 1 day              Patient currently is not medically stable to d/c.   Difficult to place patient No     Consultants:   Palliative care  Procedures:     Antimicrobials:       Subjective: Patient is somnolent, does not open eyes to voice  Objective: Vitals:   10/13/20 0223 10/13/20 0605 10/13/20 0900 10/14/20 1427  BP: (!) 115/54 (!) 108/97 (!) 108/33 (!) 118/54  Pulse: 93 (!) 103 79 68  Resp: 20 20 20    Temp: 98 F (36.7 C) 98.5 F (36.9 C)    TempSrc: Oral     SpO2: 90% 90% 96% 96%  Weight:  Height:        Intake/Output Summary (Last 24 hours) at 10/14/2020 1903 Last data filed at 10/14/2020 1500 Gross per 24 hour  Intake 480 ml  Output 280 ml  Net 200 ml   Filed Weights   10/12/20 0616  Weight: 88.5 kg    Examination:  General exam: Somnolent, difficult to wake up with voice, she does moan when moved in bed Respiratory system: Clear to auscultation. Respiratory effort  normal. Cardiovascular system:RRR. No murmurs, rubs, gallops.   Data Reviewed: I have personally reviewed following labs and imaging studies  CBC: Recent Labs  Lab 10/12/20 0815  WBC 5.6  NEUTROABS 4.8  HGB 8.6*  HCT 27.6*  MCV 97.2  PLT 52*   Basic Metabolic Panel: Recent Labs  Lab 10/12/20 0815  NA 138  K 4.3  CL 108  CO2 24  GLUCOSE 130*  BUN 18  CREATININE 0.82  CALCIUM 9.6   GFR: Estimated Creatinine Clearance: 56.7 mL/min (by C-G formula based on SCr of 0.82 mg/dL). Liver Function Tests: Recent Labs  Lab 10/12/20 0815  AST 28  ALT 20  ALKPHOS 80  BILITOT 1.8*  PROT 5.0*  ALBUMIN 2.6*   No results for input(s): LIPASE, AMYLASE in the last 168 hours. No results for input(s): AMMONIA in the last 168 hours. Coagulation Profile: No results for input(s): INR, PROTIME in the last 168 hours. Cardiac Enzymes: No results for input(s): CKTOTAL, CKMB, CKMBINDEX, TROPONINI in the last 168 hours. BNP (last 3 results) No results for input(s): PROBNP in the last 8760 hours. HbA1C: No results for input(s): HGBA1C in the last 72 hours. CBG: No results for input(s): GLUCAP in the last 168 hours. Lipid Profile: No results for input(s): CHOL, HDL, LDLCALC, TRIG, CHOLHDL, LDLDIRECT in the last 72 hours. Thyroid Function Tests: No results for input(s): TSH, T4TOTAL, FREET4, T3FREE, THYROIDAB in the last 72 hours. Anemia Panel: No results for input(s): VITAMINB12, FOLATE, FERRITIN, TIBC, IRON, RETICCTPCT in the last 72 hours. Sepsis Labs: No results for input(s): PROCALCITON, LATICACIDVEN in the last 168 hours.  Recent Results (from the past 240 hour(s))  SARS Coronavirus 2 by RT PCR (hospital order, performed in [facility] hospital lab) Nasopharyngeal Nasopharyngeal Swab     Status: None   Collection Time: 10/12/20  8:12 AM   Specimen: Nasopharyngeal Swab  Result Value Ref Range Status   SARS Coronavirus 2 NEGATIVE NEGATIVE Final    Comment: (NOTE) SARS-CoV-2  target nucleic acids are NOT DETECTED.  The SARS-CoV-2 RNA is generally detectable in upper and lower respiratory specimens during the acute phase of infection. The lowest concentration of SARS-CoV-2 viral copies this assay can detect is 250 copies / mL. A negative result does not preclude SARS-CoV-2 infection and should not be used as the sole basis for treatment or other patient management decisions.  A negative result may occur with improper specimen collection / handling, submission of specimen other than nasopharyngeal swab, presence of viral mutation(s) within the areas targeted by this assay, and inadequate number of viral copies (<250 copies / mL). A negative result must be combined with clinical observations, patient history, and epidemiological information.  Fact Sheet for Patients:   StrictlyIdeas.no  Fact Sheet for Healthcare Providers: BankingDealers.co.za  This test is not yet approved or  cleared by the [place] FDA and has been authorized for detection and/or diagnosis of SARS-CoV-2 by FDA under an Emergency Use Authorization (EUA).  This EUA will remain in effect (meaning this test can be  used) for the duration of the COVID-19 declaration under Section 564(b)(1) of the Act, 21 U.S.C. section 360bbb-3(b)(1), unless the authorization is terminated or revoked sooner.  Performed at [facility], [address] [place], [place] [address]          Radiology Studies: No results found.      Scheduled Meds: . lactulose  20 g Oral TID  . mirtazapine  15 mg Oral QHS  . OLANZapine zydis  5 mg Oral Daily  . sodium chloride flush  3 mL Intravenous Q12H  . sodium chloride flush  3 mL Intravenous Q12H  . sodium chloride flush  3 mL Intravenous Q12H   Continuous Infusions: . sodium chloride    . sodium chloride       LOS: 2 days    Time spent: 70mns    JKathie Dike MD [facility]   If  7PM-7AM, please contact night-coverage [email or web address]  10/14/2020, 7:03 PM

## 2020-10-15 DIAGNOSIS — Z515 Encounter for palliative care: Secondary | ICD-10-CM

## 2020-10-15 MED ORDER — HYDROMORPHONE HCL 1 MG/ML PO LIQD: 1.0000 mg | ORAL | 0 refills | Status: DC | PRN

## 2020-10-15 MED ORDER — CITALOPRAM HYDROBROMIDE 40 MG PO TABS: 20.0000 mg | ORAL_TABLET | Freq: Every day | ORAL | Status: AC

## 2020-10-15 MED ORDER — PANTOPRAZOLE SODIUM 40 MG PO TBEC: 40.0000 mg | DELAYED_RELEASE_TABLET | Freq: Every day | ORAL | 0 refills | Status: AC

## 2020-10-15 MED ORDER — LORAZEPAM 1 MG PO TABS: 1.0000 mg | ORAL_TABLET | Freq: Three times a day (TID) | ORAL | 0 refills | Status: DC | PRN

## 2020-10-15 MED ORDER — HYDROMORPHONE HCL 1 MG/ML PO LIQD: 1.0000 mg | ORAL | 0 refills | Status: AC | PRN

## 2020-10-15 NOTE — Care Management Important Message (Signed)
Important Message  Patient Details  Name: [name] MRN: [id number] Date of Birth: October 13, 1940   Medicare Important Message Given:  Yes     [name] 10/15/2020, 11:37 AM

## 2020-10-15 NOTE — Discharge Summary (Signed)
Physician Discharge Summary  [name] EXH:[id number] DOB: 10-25-1940 DOA: 10/12/2020  PCP: [name], MD  Admit date: 10/12/2020 Discharge date: 10/15/2020  Admitted From: Home Disposition: Home with hospice  Recommendations for Outpatient Follow-up:  1. Patient is being discharged home with hospice services for end-of-life care  Discharge Condition: Terminal CODE STATUS: DNR, comfort measures Diet recommendation: Regular diet for comfort  Brief/Interim Summary: 80 year old female with a history of dementia, remote history of colon cancer/bladder cancer, Nash cirrhosis, chronic pancytopenia, history of GI bleeding due to esophageal varices, recent history of pneumothorax which resolved spontaneously, presented to the hospital with complaints of chest pain.  Chest x-ray indicated apical lateral pneumothorax.  She was admitted for further treatment.  Discharge Diagnoses:  Principal Problem:   Pneumothorax Active Problems:   Colon cancer (HCC)   Bladder cancer (HCC)   Thrombocytopenia (HCC)   Anemia, iron deficiency   Dementia without behavioral disturbance (HCC)   Cirrhosis, non-alcoholic (HCC)   Varices, esophageal (HCC)   Acute Hepatic encephalopathy   Hospice care patient  Left-sided pneumothorax -Patient presented with left-sided chest pains -X-ray confirmed apical lateral pneumothorax -Overall oxygenation and respiratory effort appears to be stable -With patient's multiple comorbidities, thrombocytopenia, she would be a poor candidate for aggressive management such as chest tube -Family is in agreement and does not wish to pursue aggressive measures  [name] cirrhosis with recent GI bleeding due to esophageal varices -Status post banding of varices during last hospitalization -Hemoglobin is currently stable  Thrombocytopenia -Related to liver disease -Increased risk of bleeding  Dementia -Patient remains confused at this time  Goals of care -Seen  by palliative care and patient is currently DNR -After further discussion with the patient's husband and daughter [name], her multiple comorbidities, poor nutritional/functional status and expected prognosis were discussed -Her long-term prognosis remains poor and she would be an appropriate candidate for hospice -Family is in agreement and wished to take her home with hospice services -They will try to make arrangements to have additional resources at home to help care for her at home -They agree to focus patient's care towards her quality of life and comfort -She may eventually transition to residential hospice, but they would like to try at home for now -Family has spoken to [facility] hospice who will care for patient in the home   Discharge Instructions  Discharge Instructions    Diet - low sodium heart healthy   Complete by: As directed    Increase activity slowly   Complete by: As directed      Allergies as of 10/15/2020      Reactions   Bee Venom Anaphylaxis   Codeine Anaphylaxis, Rash, Shortness Of Breath   Morphine And Related Anaphylaxis, Rash   Nutritional Supplements Anaphylaxis   Phytonadione Other (See Comments)   Pt experienced an episode of cyanosis following dose of Vitamin K 10 mg IV.   Ceftin [cefuroxime] Rash   Penicillins Rash, Other (See Comments)   Has patient had a PCN reaction causing immediate rash, facial/tongue/throat swelling, SOB or lightheadedness with hypotension: No Has patient had a PCN reaction causing severe rash involving mucus membranes or skin necrosis: No Has patient had a PCN reaction that required hospitalization: No Has patient had a PCN reaction occurring within the last 10 years: No If all of the above answers are "NO", then may proceed with Cephalosporin use.   Promethazine Hcl Other (See Comments)   Reaction:  Unknown       Medication List  STOP taking these medications   Apoaequorin 20 MG Caps   cholecalciferol 1000 units  tablet Commonly known as: VITAMIN D   Ginkgo Biloba 60 MG Tabs   levofloxacin 500 MG tablet Commonly known as: LEVAQUIN   multivitamin with minerals Tabs tablet   vitamin B-12 1000 MCG tablet Commonly known as: CYANOCOBALAMIN   vitamin C 1000 MG tablet     TAKE these medications   acidophilus Caps capsule Take 1 capsule by mouth daily.   citalopram 40 MG tablet Commonly known as: CELEXA Take 0.5 tablets (20 mg total) by mouth daily.   HYDROmorphone HCl 1 MG/ML Liqd Commonly known as: Dilaudid Take 1 mL (1 mg total) by mouth every 4 (four) hours as needed for severe pain (respiratory distress).   lactulose 10 GM/15ML solution Commonly known as: CHRONULAC Take 30 mLs (20 g total) by mouth 3 (three) times daily. Titrate to 2-3 large BMs/day   LORazepam 1 MG tablet Commonly known as: ATIVAN Place 1 tablet (1 mg total) under the tongue every 8 (eight) hours as needed for anxiety.   magic mouthwash w/lidocaine Soln Take 5 mLs by mouth 3 (three) times daily as needed for mouth pain.   pantoprazole 40 MG tablet Commonly known as: PROTONIX Take 1 tablet (40 mg total) by mouth daily. What changed: when to take this       Allergies  Allergen Reactions  . Bee Venom Anaphylaxis  . Codeine Anaphylaxis, Rash and Shortness Of Breath  . Morphine And Related Anaphylaxis and Rash  . Nutritional Supplements Anaphylaxis  . Phytonadione Other (See Comments)    Pt experienced an episode of cyanosis following dose of Vitamin K 10 mg IV.  Marland Kitchen Ceftin [Cefuroxime] Rash  . Penicillins Rash and Other (See Comments)    Has patient had a PCN reaction causing immediate rash, facial/tongue/throat swelling, SOB or lightheadedness with hypotension: No Has patient had a PCN reaction causing severe rash involving mucus membranes or skin necrosis: No Has patient had a PCN reaction that required hospitalization: No Has patient had a PCN reaction occurring within the last 10 years: No If all of  the above answers are "NO", then may proceed with Cephalosporin use.  . Promethazine Hcl Other (See Comments)    Reaction:  Unknown     Consultations:     Procedures/Studies: DG Chest 2 View  Result Date: 10/01/2020 CLINICAL DATA:  Right lower lobe pneumonia EXAM: CHEST - 2 VIEW COMPARISON:  09/19/2020 FINDINGS: Small left pleural effusion. Associated left lower lobe opacity, likely atelectasis. Trace right pleural effusion. No focal consolidation. No frank interstitial edema. The heart is normal in size.  Thoracic aortic atherosclerosis. Old left proximal humeral fracture. Degenerative changes of the thoracic spine. Cholecystectomy clips. IMPRESSION: Small left and trace right pleural effusions. No frank interstitial edema. Left lower lobe opacity, likely atelectasis. Electronically Signed   By: Julian Hy M.D.   On: 10/01/2020 14:23   DG Chest 2 View  Result Date: 09/19/2020 CLINICAL DATA:  80 year old female with small left pneumothorax on chest CTA 09/18/2020. EXAM: CHEST - 2 VIEW COMPARISON:  Portable chest 09/19/2020 and earlier. FINDINGS: Semi upright AP and lateral views of the chest. Stable lung volumes. Stable mediastinal contours with tortuous thoracic aorta. Visualized tracheal air column is within normal limits. No pulmonary edema. No evidence of pleural effusion. No progression of the trace left lung base pneumothorax visible by CTA but not evident on subsequent radiographs. No acute osseous abnormality identified. Paucity of bowel gas in  the upper abdomen. IMPRESSION: 1. No progression of the trace left lung base pneumothorax, visible on recent CTA but not evident on subsequent radiographs. 2. No new cardiopulmonary abnormality. Electronically Signed   By: Genevie Ann M.D.   On: 09/19/2020 06:36   DG Chest Port 1 View  Result Date: 09/19/2020 CLINICAL DATA:  Chest pain EXAM: PORTABLE CHEST 1 VIEW COMPARISON:  September 18, 2020 FINDINGS: The heart size and mediastinal contours  are mildly enlarged. Aortic knob calcifications are seen. Both lungs are clear. The visualized skeletal structures are unremarkable. IMPRESSION: No active disease. Electronically Signed   By: Prudencio Pair M.D.   On: 09/19/2020 02:10   DG Chest Port 1 View  Result Date: 09/18/2020 CLINICAL DATA:  Chest pain. EXAM: PORTABLE CHEST 1 VIEW COMPARISON:  08/29/2020 FINDINGS: Cardiomegaly is not significantly changed. Stable aortic atherosclerosis and tortuosity. Minimal subpleural scarring in both lung bases. No acute airspace disease. No pleural fluid or pneumothorax. Bones are diffusely under mineralized. Chronic deformity of left proximal humerus. IMPRESSION: Stable cardiomegaly.  Bibasilar scarring.  No acute abnormality. Aortic Atherosclerosis (ICD10-I70.0). Electronically Signed   By: Keith Rake M.D.   On: 09/18/2020 21:22   DG Abd Acute W/Chest  Result Date: 10/12/2020 CLINICAL DATA:  Chest pain. Reported history of colon bladder carcinoma EXAM: DG ABDOMEN ACUTE WITH 1 VIEW CHEST COMPARISON:  Chest radiograph October 01, 2020; CT abdomen and pelvis March 29, 2020 FINDINGS: AP chest: There is a focal left apical and lateral pneumothorax without tension component. There is a small pleural effusion on the left. There is atelectatic change in the right base region. No consolidation. Heart is mildly enlarged with pulmonary vascularity normal. There is aortic atherosclerosis. Supine and upright abdomen: There is moderate stool in the colon. There is no bowel dilatation or air-fluid level to suggest bowel obstruction. No free air. There are surgical clips in the right abdomen. IMPRESSION: Apical and lateral pneumothorax on the left without tension component. Small left pleural effusion. Atelectatic change right lower lung region. No findings indicative of bowel obstruction or free air. Postoperative change right abdomen. Critical Value/emergent results were called by telephone at the time of interpretation on  10/12/2020 at 8:59 am to provider [facility] , who verbally acknowledged these results. Electronically Signed   By: Lowella Grip III M.D.   On: 10/12/2020 08:59   CT Angio Chest/Abd/Pel for Dissection W and/or Wo Contrast  Result Date: 09/19/2020 CLINICAL DATA:  Abdominal pain, question dissection EXAM: CT ANGIOGRAPHY CHEST, ABDOMEN AND PELVIS TECHNIQUE: Non-contrast CT of the chest was initially obtained. Multidetector CT imaging through the chest, abdomen and pelvis was performed using the standard protocol during bolus administration of intravenous contrast. Multiplanar reconstructed images and MIPs were obtained and reviewed to evaluate the vascular anatomy. CONTRAST:  123m OMNIPAQUE IOHEXOL 350 MG/ML SOLN COMPARISON:  None. FINDINGS: CTA CHEST FINDINGS Cardiovascular: --Heart: The heart size is normal.  There is nopericardial effusion. --Aorta: The course and caliber of the thoracic aorta are normal. There is scattered aortic atherosclerotic calcification. Precontrast images show no aortic intramural hematoma. There is no blood pool, dissection or penetrating ulcer demonstrated on arterial phase postcontrast imaging. There is a conventional 3 vessel aortic arch branching pattern. The proximal arch vessels are widely patent. --Pulmonary Arteries: Contrast timing is optimized for preferential opacification of the aorta. Within that limitation, normal central pulmonary arteries. Mediastinum/Nodes: No mediastinal, hilar or axillary lymphadenopathy. The visualized thyroid and thoracic esophageal course are unremarkable. Lungs/Pleura: There is a small left  basilar pneumothorax present. No mediastinal shift. No pleural effusion. Musculoskeletal: No chest wall abnormality. No acute osseous findings. Review of the MIP images confirms the above findings. CTA ABDOMEN AND PELVIS FINDINGS VASCULAR Aorta: Normal caliber aorta without aneurysm, dissection, vasculitis or hemodynamically significant stenosis.  There is scattered aortic atherosclerosis. Celiac: No aneurysm, dissection or hemodynamically significant stenosis. Normal branching pattern SMA: There is mild stenosis at the origin of the SMA due to atherosclerosis. Renals: Single renal arteries bilaterally. No aneurysm, dissection, stenosis or evidence of fibromuscular dysplasia. IMA: Patent without abnormality. Inflow: No aneurysm, stenosis or dissection. Veins: Normal course and caliber of the major veins. Assessment is otherwise limited by the arterial dominant contrast phase. Review of the MIP images confirms the above findings. NON-VASCULAR Hepatobiliary: There is a slightly shrunken nodular liver contour seen throughout. The patient is status post cholecystectomy. Again noted is a tiny 5 mm hypodense lesion in the posterior right liver lobe. 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 Pancreas: Normal contours without ductal dilatation. No peripancreatic fluid collection. Spleen: Normal arterial phase splenic enhancement pattern. There is mild splenomegaly measuring 14 cm in craniocaudad dimension. Adrenals/Urinary Tract: --Adrenal glands: Normal. Kidneys: A small amount of air seen within the bilateral renal pelvis which could be due to recent catheter. No hydronephrosis or collecting system calculi. 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 Stomach/Bowel: --Stomach/Duodenum: A small hiatal hernia is present. There appears to be question of fat stranding changes seen at the gastroduodenal junction. --Small bowel: No dilatation or inflammation. --Colon: Scattered colonic diverticula are noted. The patient is status post cholecystectomy. A large amount of rectal colonic stool is present. Lymphatic: No abdominal or pelvic lymphadenopathy. A small amount abdominopelvic  ascites is present. Reproductive: No free fluid in the pelvis. Musculoskeletal. No bony spinal canal stenosis or focal osseous abnormality. There is diffuse osteopenia. Other: None. Review of the MIP images confirms the above findings. IMPRESSION: 1. No acute aortic abnormality. 2. Small left basilar pneumothorax. 3. Small amount of abdominopelvic ascites. 4. Findings which may be suggestive of gastritis at the gastroduodenal junction. 5. Findings of cirrhosis and portal hypertension 6. Chronic portal vein/splenic confluence thrombosis. 7.  Aortic Atherosclerosis (ICD10-I70.0). 8. These results were called by telephone at the time of interpretation on 09/19/2020 at 12:06 am to provider DAN FLOYD , who verbally acknowledged these results. Electronically Signed   By: Prudencio Pair M.D.   On: 09/19/2020 00:05       Subjective: Somnolent but wakes up to voice, appears comfortable  Discharge Exam: Vitals:   10/13/20 0605 10/13/20 0900 10/14/20 1427 10/15/20 0529  BP: (!) 108/97 (!) 108/33 (!) 118/54 127/83  Pulse: (!) 103 79 68 68  Resp: 20 20  20   Temp: 98.5 F (36.9 C)   98.3 F (36.8 C)  TempSrc:    Oral  SpO2: 90% 96% 96% 92%  Weight:      Height:        General: Somnolent but wakes up to voice Cardiovascular: RRR, S1/S2 +, no rubs, no gallops Respiratory: CTA bilaterally, no wheezing, no rhonchi     The results of significant diagnostics from this hospitalization (including imaging, microbiology, ancillary and laboratory) are listed below for reference.     Microbiology: Recent Results (from the past 240 hour(s))  SARS Coronavirus 2 by RT PCR (hospital order, performed  in [facility] lab) Nasopharyngeal Nasopharyngeal Swab     Status: None   Collection Time: 10/12/20  8:12 AM   Specimen: Nasopharyngeal Swab  Result Value Ref Range Status   SARS Coronavirus 2 NEGATIVE NEGATIVE Final    Comment: (NOTE) SARS-CoV-2 target nucleic acids are NOT DETECTED.  The SARS-CoV-2  RNA is generally detectable in upper and lower respiratory specimens during the acute phase of infection. The lowest concentration of SARS-CoV-2 viral copies this assay can detect is 250 copies / mL. A negative result does not preclude SARS-CoV-2 infection and should not be used as the sole basis for treatment or other patient management decisions.  A negative result may occur with improper specimen collection / handling, submission of specimen other than nasopharyngeal swab, presence of viral mutation(s) within the areas targeted by this assay, and inadequate number of viral copies (<250 copies / mL). A negative result must be combined with clinical observations, patient history, and epidemiological information.  Fact Sheet for Patients:   StrictlyIdeas.no  Fact Sheet for Healthcare Providers: BankingDealers.co.za  This test is not yet approved or  cleared by the [place] FDA and has been authorized for detection and/or diagnosis of SARS-CoV-2 by FDA under an Emergency Use Authorization (EUA).  This EUA will remain in effect (meaning this test can be used) for the duration of the COVID-19 declaration under Section 564(b)(1) of the Act, 21 U.S.C. section 360bbb-3(b)(1), unless the authorization is terminated or revoked sooner.  Performed at [facility], [address], [place], [place] [address]      Labs: BNP (last 3 results) No results for input(s): BNP in the last 8760 hours. Basic Metabolic Panel: Recent Labs  Lab 10/12/20 0815  NA 138  K 4.3  CL 108  CO2 24  GLUCOSE 130*  BUN 18  CREATININE 0.82  CALCIUM 9.6   Liver Function Tests: Recent Labs  Lab 10/12/20 0815  AST 28  ALT 20  ALKPHOS 80  BILITOT 1.8*  PROT 5.0*  ALBUMIN 2.6*   No results for input(s): LIPASE, AMYLASE in the last 168 hours. No results for input(s): AMMONIA in the last 168 hours. CBC: Recent Labs  Lab 10/12/20 0815  WBC 5.6   NEUTROABS 4.8  HGB 8.6*  HCT 27.6*  MCV 97.2  PLT 52*   Cardiac Enzymes: No results for input(s): CKTOTAL, CKMB, CKMBINDEX, TROPONINI in the last 168 hours. BNP: Invalid input(s): POCBNP CBG: No results for input(s): GLUCAP in the last 168 hours. D-Dimer No results for input(s): DDIMER in the last 72 hours. Hgb A1c No results for input(s): HGBA1C in the last 72 hours. Lipid Profile No results for input(s): CHOL, HDL, LDLCALC, TRIG, CHOLHDL, LDLDIRECT in the last 72 hours. Thyroid function studies No results for input(s): TSH, T4TOTAL, T3FREE, THYROIDAB in the last 72 hours.  Invalid input(s): FREET3 Anemia work up No results for input(s): VITAMINB12, FOLATE, FERRITIN, TIBC, IRON, RETICCTPCT in the last 72 hours. Urinalysis    Component Value Date/Time   COLORURINE AMBER (A) 08/29/2020 1002   APPEARANCEUR TURBID (A) 08/29/2020 1002   LABSPEC 1.011 08/29/2020 1002   PHURINE 8.0 08/29/2020 1002   GLUCOSEU NEGATIVE 08/29/2020 1002   HGBUR NEGATIVE 08/29/2020 1002   [place] 08/29/2020 1002   KETONESUR NEGATIVE 08/29/2020 1002   PROTEINUR 30 (A) 08/29/2020 1002   UROBILINOGEN 1.0 04/06/2014 1856   NITRITE NEGATIVE 08/29/2020 1002   LEUKOCYTESUR LARGE (A) 08/29/2020 1002   Sepsis Labs Invalid input(s): PROCALCITONIN,  WBC,  LACTICIDVEN  Microbiology Recent Results (from the past 240 hour(s))  SARS Coronavirus 2 by RT PCR (hospital order, performed in [facility] hospital lab) Nasopharyngeal Nasopharyngeal Swab     Status: None   Collection Time: 10/12/20  8:12 AM   Specimen: Nasopharyngeal Swab  Result Value Ref Range Status   SARS Coronavirus 2 NEGATIVE NEGATIVE Final    Comment: (NOTE) SARS-CoV-2 target nucleic acids are NOT DETECTED.  The SARS-CoV-2 RNA is generally detectable in upper and lower respiratory specimens during the acute phase of infection. The lowest concentration of SARS-CoV-2 viral copies this assay can detect is 250 copies / mL. A  negative result does not preclude SARS-CoV-2 infection and should not be used as the sole basis for treatment or other patient management decisions.  A negative result may occur with improper specimen collection / handling, submission of specimen other than nasopharyngeal swab, presence of viral mutation(s) within the areas targeted by this assay, and inadequate number of viral copies (<250 copies / mL). A negative result must be combined with clinical observations, patient history, and epidemiological information.  Fact Sheet for Patients:   StrictlyIdeas.no  Fact Sheet for Healthcare Providers: BankingDealers.co.za  This test is not yet approved or  cleared by the [place] FDA and has been authorized for detection and/or diagnosis of SARS-CoV-2 by FDA under an Emergency Use Authorization (EUA).  This EUA will remain in effect (meaning this test can be used) for the duration of the COVID-19 declaration under Section 564(b)(1) of the Act, 21 U.S.C. section 360bbb-3(b)(1), unless the authorization is terminated or revoked sooner.  Performed at [facility], [address], [place],  [address]      Time coordinating discharge: 6mns  SIGNED:   JKathie Dike MD  [facility] 10/15/2020, 8:22 PM   If 7PM-7AM, please contact night-coverage [email or web address]

## 2020-10-15 NOTE — TOC Transition Note (Signed)
Transition of Care [facility]) - CM/SW Discharge Note   Patient Details  Name: [name] MRN: [id number] Date of Birth: 18-Mar-1941  [facility] CM/SW Contact:  Natasha Bence, LCSW Phone Number: 10/15/2020, 3:31 PM   Clinical Narrative:    CSW received d/c order for patient. Cassandra with [facility] agreeable to take patient on 10/15/2020. CSW notified MD that prescriptions will need to be sent to [facility].  CSW called EMS and completed med necessity. TOC signing off.   Final next level of care: Home w Hospice Care Barriers to Discharge: Continued Medical Work up   Patient Goals and CMS Choice Patient states their goals for this hospitalization and ongoing recovery are:: Home with hospice CMS Medicare.gov Compare Post Acute Care list provided to:: Patient Choice offered to / list presented to : [place]  Discharge Placement                       Discharge Plan and Services In-house Referral: NA Discharge Planning Services: NA Post Acute Care Choice: NA          DME Arranged: N/A DME Agency: NA       HH Arranged: NA HH Agency: NA        Social Determinants of Health (SDOH) Interventions     Readmission Risk Interventions Readmission Risk Prevention Plan 10/13/2020 09/21/2020  Transportation Screening Complete Complete  PCP or Specialist Appt within 3-5 Days Complete -  HRI or [place] Complete Complete  Social Work Consult for [place] Planning/Counseling Complete Complete  Palliative Care Screening Complete Complete  Medication Review Press photographer) Complete Complete  Some recent data might be hidden

## 2020-10-16 DIAGNOSIS — R52 Pain, unspecified: Secondary | ICD-10-CM | POA: Diagnosis not present

## 2020-10-16 DIAGNOSIS — F0280 Dementia in other diseases classified elsewhere without behavioral disturbance: Secondary | ICD-10-CM | POA: Diagnosis not present

## 2020-10-16 DIAGNOSIS — M1990 Unspecified osteoarthritis, unspecified site: Secondary | ICD-10-CM | POA: Diagnosis not present

## 2020-10-16 DIAGNOSIS — K7581 Nonalcoholic steatohepatitis (NASH): Secondary | ICD-10-CM | POA: Diagnosis not present

## 2020-10-16 DIAGNOSIS — Z906 Acquired absence of other parts of urinary tract: Secondary | ICD-10-CM | POA: Diagnosis not present

## 2020-10-16 DIAGNOSIS — R531 Weakness: Secondary | ICD-10-CM | POA: Diagnosis not present

## 2020-10-16 DIAGNOSIS — K219 Gastro-esophageal reflux disease without esophagitis: Secondary | ICD-10-CM | POA: Diagnosis not present

## 2020-10-16 DIAGNOSIS — D649 Anemia, unspecified: Secondary | ICD-10-CM | POA: Diagnosis not present

## 2020-10-16 DIAGNOSIS — C679 Malignant neoplasm of bladder, unspecified: Secondary | ICD-10-CM | POA: Diagnosis not present

## 2020-10-16 DIAGNOSIS — N390 Urinary tract infection, site not specified: Secondary | ICD-10-CM | POA: Diagnosis not present

## 2020-10-16 DIAGNOSIS — F331 Major depressive disorder, recurrent, moderate: Secondary | ICD-10-CM | POA: Diagnosis not present

## 2020-10-16 DIAGNOSIS — I8501 Esophageal varices with bleeding: Secondary | ICD-10-CM | POA: Diagnosis not present

## 2020-10-16 DIAGNOSIS — C189 Malignant neoplasm of colon, unspecified: Secondary | ICD-10-CM | POA: Diagnosis not present

## 2020-10-18 DIAGNOSIS — K7581 Nonalcoholic steatohepatitis (NASH): Secondary | ICD-10-CM | POA: Diagnosis not present

## 2020-10-18 DIAGNOSIS — Z906 Acquired absence of other parts of urinary tract: Secondary | ICD-10-CM | POA: Diagnosis not present

## 2020-10-18 DIAGNOSIS — C679 Malignant neoplasm of bladder, unspecified: Secondary | ICD-10-CM | POA: Diagnosis not present

## 2020-10-18 DIAGNOSIS — D649 Anemia, unspecified: Secondary | ICD-10-CM | POA: Diagnosis not present

## 2020-10-18 DIAGNOSIS — N390 Urinary tract infection, site not specified: Secondary | ICD-10-CM | POA: Diagnosis not present

## 2020-10-18 DIAGNOSIS — C189 Malignant neoplasm of colon, unspecified: Secondary | ICD-10-CM | POA: Diagnosis not present

## 2020-10-19 DIAGNOSIS — C679 Malignant neoplasm of bladder, unspecified: Secondary | ICD-10-CM | POA: Diagnosis not present

## 2020-10-19 DIAGNOSIS — K7581 Nonalcoholic steatohepatitis (NASH): Secondary | ICD-10-CM | POA: Diagnosis not present

## 2020-10-19 DIAGNOSIS — N390 Urinary tract infection, site not specified: Secondary | ICD-10-CM | POA: Diagnosis not present

## 2020-10-19 DIAGNOSIS — C189 Malignant neoplasm of colon, unspecified: Secondary | ICD-10-CM | POA: Diagnosis not present

## 2020-10-19 DIAGNOSIS — Z906 Acquired absence of other parts of urinary tract: Secondary | ICD-10-CM | POA: Diagnosis not present

## 2020-10-19 DIAGNOSIS — D649 Anemia, unspecified: Secondary | ICD-10-CM | POA: Diagnosis not present

## 2020-10-20 DIAGNOSIS — Z906 Acquired absence of other parts of urinary tract: Secondary | ICD-10-CM | POA: Diagnosis not present

## 2020-10-20 DIAGNOSIS — N390 Urinary tract infection, site not specified: Secondary | ICD-10-CM | POA: Diagnosis not present

## 2020-10-20 DIAGNOSIS — K7581 Nonalcoholic steatohepatitis (NASH): Secondary | ICD-10-CM | POA: Diagnosis not present

## 2020-10-20 DIAGNOSIS — C679 Malignant neoplasm of bladder, unspecified: Secondary | ICD-10-CM | POA: Diagnosis not present

## 2020-10-20 DIAGNOSIS — D649 Anemia, unspecified: Secondary | ICD-10-CM | POA: Diagnosis not present

## 2020-10-20 DIAGNOSIS — C189 Malignant neoplasm of colon, unspecified: Secondary | ICD-10-CM | POA: Diagnosis not present

## 2020-10-23 ENCOUNTER — Other Ambulatory Visit: Payer: Self-pay | Admitting: Family Medicine

## 2020-10-23 ENCOUNTER — Telehealth: Payer: Self-pay | Admitting: Family Medicine

## 2020-10-23 DIAGNOSIS — C679 Malignant neoplasm of bladder, unspecified: Secondary | ICD-10-CM | POA: Diagnosis not present

## 2020-10-23 DIAGNOSIS — F331 Major depressive disorder, recurrent, moderate: Secondary | ICD-10-CM | POA: Diagnosis not present

## 2020-10-23 DIAGNOSIS — D649 Anemia, unspecified: Secondary | ICD-10-CM | POA: Diagnosis not present

## 2020-10-23 DIAGNOSIS — I8501 Esophageal varices with bleeding: Secondary | ICD-10-CM | POA: Diagnosis not present

## 2020-10-23 DIAGNOSIS — Z906 Acquired absence of other parts of urinary tract: Secondary | ICD-10-CM | POA: Diagnosis not present

## 2020-10-23 DIAGNOSIS — N390 Urinary tract infection, site not specified: Secondary | ICD-10-CM | POA: Diagnosis not present

## 2020-10-23 DIAGNOSIS — C189 Malignant neoplasm of colon, unspecified: Secondary | ICD-10-CM | POA: Diagnosis not present

## 2020-10-23 DIAGNOSIS — K7581 Nonalcoholic steatohepatitis (NASH): Secondary | ICD-10-CM | POA: Diagnosis not present

## 2020-10-23 DIAGNOSIS — R531 Weakness: Secondary | ICD-10-CM | POA: Diagnosis not present

## 2020-10-23 DIAGNOSIS — R52 Pain, unspecified: Secondary | ICD-10-CM | POA: Diagnosis not present

## 2020-10-23 DIAGNOSIS — M1990 Unspecified osteoarthritis, unspecified site: Secondary | ICD-10-CM | POA: Diagnosis not present

## 2020-10-23 DIAGNOSIS — F0280 Dementia in other diseases classified elsewhere without behavioral disturbance: Secondary | ICD-10-CM | POA: Diagnosis not present

## 2020-10-23 DIAGNOSIS — K219 Gastro-esophageal reflux disease without esophagitis: Secondary | ICD-10-CM | POA: Diagnosis not present

## 2020-10-23 MED ORDER — TRAMADOL HCL 50 MG PO TABS: 50.0000 mg | ORAL_TABLET | Freq: Three times a day (TID) | ORAL | 0 refills | Status: DC | PRN

## 2020-10-23 NOTE — Telephone Encounter (Signed)
I will try tramadol.  Stop Dilaudid.  However both Ativan and Dilaudid can make her sleepy.  As long she is taking Ativan it would likely make her sleepy as well

## 2020-10-23 NOTE — Telephone Encounter (Signed)
Nurse is asking for alternate medication for pt, pt is sleeping too much

## 2020-10-23 NOTE — Telephone Encounter (Signed)
Hospice Nurse Hettie Holstein called about getting med changed to help from sedating pt so much, asking for possible ativan to help rest at night. Please call back   Cb#: [phone]

## 2020-10-23 NOTE — Telephone Encounter (Signed)
The patient is taking Dilaudid along with the ativan for anxiety. Nurse is asking for another pain med for the pt. Note that she is allergic to codeine and morphine

## 2020-10-23 NOTE — Telephone Encounter (Signed)
What medicine is he referring to.  Is she taking dilaudid or ativan.  Both cause sleepiness.

## 2020-10-24 NOTE — Telephone Encounter (Signed)
Pls send medication to [facility] where the hospice center is contracted

## 2020-10-25 ENCOUNTER — Other Ambulatory Visit: Payer: Self-pay | Admitting: Family Medicine

## 2020-10-25 MED ORDER — TRAMADOL HCL 50 MG PO TABS: 50.0000 mg | ORAL_TABLET | Freq: Three times a day (TID) | ORAL | 0 refills | Status: AC | PRN

## 2020-10-26 DIAGNOSIS — D649 Anemia, unspecified: Secondary | ICD-10-CM | POA: Diagnosis not present

## 2020-10-26 DIAGNOSIS — N390 Urinary tract infection, site not specified: Secondary | ICD-10-CM | POA: Diagnosis not present

## 2020-10-26 DIAGNOSIS — K7581 Nonalcoholic steatohepatitis (NASH): Secondary | ICD-10-CM | POA: Diagnosis not present

## 2020-10-26 DIAGNOSIS — Z906 Acquired absence of other parts of urinary tract: Secondary | ICD-10-CM | POA: Diagnosis not present

## 2020-10-26 DIAGNOSIS — C679 Malignant neoplasm of bladder, unspecified: Secondary | ICD-10-CM | POA: Diagnosis not present

## 2020-10-26 DIAGNOSIS — C189 Malignant neoplasm of colon, unspecified: Secondary | ICD-10-CM | POA: Diagnosis not present

## 2020-10-27 DIAGNOSIS — C679 Malignant neoplasm of bladder, unspecified: Secondary | ICD-10-CM | POA: Diagnosis not present

## 2020-10-27 DIAGNOSIS — D649 Anemia, unspecified: Secondary | ICD-10-CM | POA: Diagnosis not present

## 2020-10-27 DIAGNOSIS — N390 Urinary tract infection, site not specified: Secondary | ICD-10-CM | POA: Diagnosis not present

## 2020-10-27 DIAGNOSIS — K7581 Nonalcoholic steatohepatitis (NASH): Secondary | ICD-10-CM | POA: Diagnosis not present

## 2020-10-27 DIAGNOSIS — Z906 Acquired absence of other parts of urinary tract: Secondary | ICD-10-CM | POA: Diagnosis not present

## 2020-10-27 DIAGNOSIS — C189 Malignant neoplasm of colon, unspecified: Secondary | ICD-10-CM | POA: Diagnosis not present

## 2020-11-01 DIAGNOSIS — N390 Urinary tract infection, site not specified: Secondary | ICD-10-CM | POA: Diagnosis not present

## 2020-11-01 DIAGNOSIS — K7581 Nonalcoholic steatohepatitis (NASH): Secondary | ICD-10-CM | POA: Diagnosis not present

## 2020-11-01 DIAGNOSIS — C679 Malignant neoplasm of bladder, unspecified: Secondary | ICD-10-CM | POA: Diagnosis not present

## 2020-11-01 DIAGNOSIS — Z906 Acquired absence of other parts of urinary tract: Secondary | ICD-10-CM | POA: Diagnosis not present

## 2020-11-01 DIAGNOSIS — D649 Anemia, unspecified: Secondary | ICD-10-CM | POA: Diagnosis not present

## 2020-11-01 DIAGNOSIS — C189 Malignant neoplasm of colon, unspecified: Secondary | ICD-10-CM | POA: Diagnosis not present

## 2020-11-03 DIAGNOSIS — C679 Malignant neoplasm of bladder, unspecified: Secondary | ICD-10-CM | POA: Diagnosis not present

## 2020-11-03 DIAGNOSIS — N390 Urinary tract infection, site not specified: Secondary | ICD-10-CM | POA: Diagnosis not present

## 2020-11-03 DIAGNOSIS — Z906 Acquired absence of other parts of urinary tract: Secondary | ICD-10-CM | POA: Diagnosis not present

## 2020-11-03 DIAGNOSIS — C189 Malignant neoplasm of colon, unspecified: Secondary | ICD-10-CM | POA: Diagnosis not present

## 2020-11-03 DIAGNOSIS — D649 Anemia, unspecified: Secondary | ICD-10-CM | POA: Diagnosis not present

## 2020-11-03 DIAGNOSIS — K7581 Nonalcoholic steatohepatitis (NASH): Secondary | ICD-10-CM | POA: Diagnosis not present

## 2020-11-05 DIAGNOSIS — Z906 Acquired absence of other parts of urinary tract: Secondary | ICD-10-CM | POA: Diagnosis not present

## 2020-11-05 DIAGNOSIS — N390 Urinary tract infection, site not specified: Secondary | ICD-10-CM | POA: Diagnosis not present

## 2020-11-05 DIAGNOSIS — D649 Anemia, unspecified: Secondary | ICD-10-CM | POA: Diagnosis not present

## 2020-11-05 DIAGNOSIS — C189 Malignant neoplasm of colon, unspecified: Secondary | ICD-10-CM | POA: Diagnosis not present

## 2020-11-05 DIAGNOSIS — C679 Malignant neoplasm of bladder, unspecified: Secondary | ICD-10-CM | POA: Diagnosis not present

## 2020-11-05 DIAGNOSIS — K7581 Nonalcoholic steatohepatitis (NASH): Secondary | ICD-10-CM | POA: Diagnosis not present

## 2020-11-08 DIAGNOSIS — K7581 Nonalcoholic steatohepatitis (NASH): Secondary | ICD-10-CM | POA: Diagnosis not present

## 2020-11-08 DIAGNOSIS — C679 Malignant neoplasm of bladder, unspecified: Secondary | ICD-10-CM | POA: Diagnosis not present

## 2020-11-08 DIAGNOSIS — D649 Anemia, unspecified: Secondary | ICD-10-CM | POA: Diagnosis not present

## 2020-11-08 DIAGNOSIS — C189 Malignant neoplasm of colon, unspecified: Secondary | ICD-10-CM | POA: Diagnosis not present

## 2020-11-08 DIAGNOSIS — Z906 Acquired absence of other parts of urinary tract: Secondary | ICD-10-CM | POA: Diagnosis not present

## 2020-11-08 DIAGNOSIS — N390 Urinary tract infection, site not specified: Secondary | ICD-10-CM | POA: Diagnosis not present

## 2020-11-09 ENCOUNTER — Other Ambulatory Visit: Payer: Self-pay | Admitting: Family Medicine

## 2020-11-10 DIAGNOSIS — Z906 Acquired absence of other parts of urinary tract: Secondary | ICD-10-CM | POA: Diagnosis not present

## 2020-11-10 DIAGNOSIS — C679 Malignant neoplasm of bladder, unspecified: Secondary | ICD-10-CM | POA: Diagnosis not present

## 2020-11-10 DIAGNOSIS — K7581 Nonalcoholic steatohepatitis (NASH): Secondary | ICD-10-CM | POA: Diagnosis not present

## 2020-11-10 DIAGNOSIS — D649 Anemia, unspecified: Secondary | ICD-10-CM | POA: Diagnosis not present

## 2020-11-10 DIAGNOSIS — N390 Urinary tract infection, site not specified: Secondary | ICD-10-CM | POA: Diagnosis not present

## 2020-11-10 DIAGNOSIS — C189 Malignant neoplasm of colon, unspecified: Secondary | ICD-10-CM | POA: Diagnosis not present

## 2020-11-12 DIAGNOSIS — C189 Malignant neoplasm of colon, unspecified: Secondary | ICD-10-CM | POA: Diagnosis not present

## 2020-11-12 DIAGNOSIS — K7581 Nonalcoholic steatohepatitis (NASH): Secondary | ICD-10-CM | POA: Diagnosis not present

## 2020-11-12 DIAGNOSIS — Z906 Acquired absence of other parts of urinary tract: Secondary | ICD-10-CM | POA: Diagnosis not present

## 2020-11-12 DIAGNOSIS — D649 Anemia, unspecified: Secondary | ICD-10-CM | POA: Diagnosis not present

## 2020-11-12 DIAGNOSIS — N390 Urinary tract infection, site not specified: Secondary | ICD-10-CM | POA: Diagnosis not present

## 2020-11-12 DIAGNOSIS — C679 Malignant neoplasm of bladder, unspecified: Secondary | ICD-10-CM | POA: Diagnosis not present

## 2020-11-14 DIAGNOSIS — C189 Malignant neoplasm of colon, unspecified: Secondary | ICD-10-CM | POA: Diagnosis not present

## 2020-11-14 DIAGNOSIS — D649 Anemia, unspecified: Secondary | ICD-10-CM | POA: Diagnosis not present

## 2020-11-14 DIAGNOSIS — N390 Urinary tract infection, site not specified: Secondary | ICD-10-CM | POA: Diagnosis not present

## 2020-11-14 DIAGNOSIS — K7581 Nonalcoholic steatohepatitis (NASH): Secondary | ICD-10-CM | POA: Diagnosis not present

## 2020-11-14 DIAGNOSIS — C679 Malignant neoplasm of bladder, unspecified: Secondary | ICD-10-CM | POA: Diagnosis not present

## 2020-11-14 DIAGNOSIS — Z906 Acquired absence of other parts of urinary tract: Secondary | ICD-10-CM | POA: Diagnosis not present

## 2020-11-17 DIAGNOSIS — D649 Anemia, unspecified: Secondary | ICD-10-CM | POA: Diagnosis not present

## 2020-11-17 DIAGNOSIS — Z906 Acquired absence of other parts of urinary tract: Secondary | ICD-10-CM | POA: Diagnosis not present

## 2020-11-17 DIAGNOSIS — K7581 Nonalcoholic steatohepatitis (NASH): Secondary | ICD-10-CM | POA: Diagnosis not present

## 2020-11-17 DIAGNOSIS — N390 Urinary tract infection, site not specified: Secondary | ICD-10-CM | POA: Diagnosis not present

## 2020-11-17 DIAGNOSIS — C679 Malignant neoplasm of bladder, unspecified: Secondary | ICD-10-CM | POA: Diagnosis not present

## 2020-11-17 DIAGNOSIS — C189 Malignant neoplasm of colon, unspecified: Secondary | ICD-10-CM | POA: Diagnosis not present

## 2020-11-19 DIAGNOSIS — Z906 Acquired absence of other parts of urinary tract: Secondary | ICD-10-CM | POA: Diagnosis not present

## 2020-11-19 DIAGNOSIS — K7581 Nonalcoholic steatohepatitis (NASH): Secondary | ICD-10-CM | POA: Diagnosis not present

## 2020-11-19 DIAGNOSIS — C679 Malignant neoplasm of bladder, unspecified: Secondary | ICD-10-CM | POA: Diagnosis not present

## 2020-11-19 DIAGNOSIS — C189 Malignant neoplasm of colon, unspecified: Secondary | ICD-10-CM | POA: Diagnosis not present

## 2020-11-19 DIAGNOSIS — N390 Urinary tract infection, site not specified: Secondary | ICD-10-CM | POA: Diagnosis not present

## 2020-11-19 DIAGNOSIS — D649 Anemia, unspecified: Secondary | ICD-10-CM | POA: Diagnosis not present

## 2020-11-20 DIAGNOSIS — D649 Anemia, unspecified: Secondary | ICD-10-CM | POA: Diagnosis not present

## 2020-11-20 DIAGNOSIS — R52 Pain, unspecified: Secondary | ICD-10-CM | POA: Diagnosis not present

## 2020-11-20 DIAGNOSIS — N390 Urinary tract infection, site not specified: Secondary | ICD-10-CM | POA: Diagnosis not present

## 2020-11-20 DIAGNOSIS — R531 Weakness: Secondary | ICD-10-CM | POA: Diagnosis not present

## 2020-11-20 DIAGNOSIS — I8501 Esophageal varices with bleeding: Secondary | ICD-10-CM | POA: Diagnosis not present

## 2020-11-20 DIAGNOSIS — F0280 Dementia in other diseases classified elsewhere without behavioral disturbance: Secondary | ICD-10-CM | POA: Diagnosis not present

## 2020-11-20 DIAGNOSIS — C189 Malignant neoplasm of colon, unspecified: Secondary | ICD-10-CM | POA: Diagnosis not present

## 2020-11-20 DIAGNOSIS — K7581 Nonalcoholic steatohepatitis (NASH): Secondary | ICD-10-CM | POA: Diagnosis not present

## 2020-11-20 DIAGNOSIS — K219 Gastro-esophageal reflux disease without esophagitis: Secondary | ICD-10-CM | POA: Diagnosis not present

## 2020-11-20 DIAGNOSIS — F331 Major depressive disorder, recurrent, moderate: Secondary | ICD-10-CM | POA: Diagnosis not present

## 2020-11-20 DIAGNOSIS — C679 Malignant neoplasm of bladder, unspecified: Secondary | ICD-10-CM | POA: Diagnosis not present

## 2020-11-20 DIAGNOSIS — M1990 Unspecified osteoarthritis, unspecified site: Secondary | ICD-10-CM | POA: Diagnosis not present

## 2020-11-20 DIAGNOSIS — Z906 Acquired absence of other parts of urinary tract: Secondary | ICD-10-CM | POA: Diagnosis not present

## 2020-11-22 DIAGNOSIS — N390 Urinary tract infection, site not specified: Secondary | ICD-10-CM | POA: Diagnosis not present

## 2020-11-22 DIAGNOSIS — K7581 Nonalcoholic steatohepatitis (NASH): Secondary | ICD-10-CM | POA: Diagnosis not present

## 2020-11-22 DIAGNOSIS — C189 Malignant neoplasm of colon, unspecified: Secondary | ICD-10-CM | POA: Diagnosis not present

## 2020-11-22 DIAGNOSIS — D649 Anemia, unspecified: Secondary | ICD-10-CM | POA: Diagnosis not present

## 2020-11-22 DIAGNOSIS — Z906 Acquired absence of other parts of urinary tract: Secondary | ICD-10-CM | POA: Diagnosis not present

## 2020-11-22 DIAGNOSIS — C679 Malignant neoplasm of bladder, unspecified: Secondary | ICD-10-CM | POA: Diagnosis not present

## 2020-11-24 DIAGNOSIS — Z906 Acquired absence of other parts of urinary tract: Secondary | ICD-10-CM | POA: Diagnosis not present

## 2020-11-24 DIAGNOSIS — C679 Malignant neoplasm of bladder, unspecified: Secondary | ICD-10-CM | POA: Diagnosis not present

## 2020-11-24 DIAGNOSIS — K7581 Nonalcoholic steatohepatitis (NASH): Secondary | ICD-10-CM | POA: Diagnosis not present

## 2020-11-24 DIAGNOSIS — C189 Malignant neoplasm of colon, unspecified: Secondary | ICD-10-CM | POA: Diagnosis not present

## 2020-11-24 DIAGNOSIS — D649 Anemia, unspecified: Secondary | ICD-10-CM | POA: Diagnosis not present

## 2020-11-24 DIAGNOSIS — N390 Urinary tract infection, site not specified: Secondary | ICD-10-CM | POA: Diagnosis not present

## 2020-11-26 DIAGNOSIS — N390 Urinary tract infection, site not specified: Secondary | ICD-10-CM | POA: Diagnosis not present

## 2020-11-26 DIAGNOSIS — Z906 Acquired absence of other parts of urinary tract: Secondary | ICD-10-CM | POA: Diagnosis not present

## 2020-11-26 DIAGNOSIS — K7581 Nonalcoholic steatohepatitis (NASH): Secondary | ICD-10-CM | POA: Diagnosis not present

## 2020-11-26 DIAGNOSIS — C189 Malignant neoplasm of colon, unspecified: Secondary | ICD-10-CM | POA: Diagnosis not present

## 2020-11-26 DIAGNOSIS — D649 Anemia, unspecified: Secondary | ICD-10-CM | POA: Diagnosis not present

## 2020-11-26 DIAGNOSIS — C679 Malignant neoplasm of bladder, unspecified: Secondary | ICD-10-CM | POA: Diagnosis not present

## 2020-11-27 DIAGNOSIS — C189 Malignant neoplasm of colon, unspecified: Secondary | ICD-10-CM | POA: Diagnosis not present

## 2020-11-27 DIAGNOSIS — N390 Urinary tract infection, site not specified: Secondary | ICD-10-CM | POA: Diagnosis not present

## 2020-11-27 DIAGNOSIS — D649 Anemia, unspecified: Secondary | ICD-10-CM | POA: Diagnosis not present

## 2020-11-27 DIAGNOSIS — Z906 Acquired absence of other parts of urinary tract: Secondary | ICD-10-CM | POA: Diagnosis not present

## 2020-11-27 DIAGNOSIS — C679 Malignant neoplasm of bladder, unspecified: Secondary | ICD-10-CM | POA: Diagnosis not present

## 2020-11-27 DIAGNOSIS — K7581 Nonalcoholic steatohepatitis (NASH): Secondary | ICD-10-CM | POA: Diagnosis not present

## 2020-11-29 DIAGNOSIS — K7581 Nonalcoholic steatohepatitis (NASH): Secondary | ICD-10-CM | POA: Diagnosis not present

## 2020-11-29 DIAGNOSIS — C189 Malignant neoplasm of colon, unspecified: Secondary | ICD-10-CM | POA: Diagnosis not present

## 2020-11-29 DIAGNOSIS — Z906 Acquired absence of other parts of urinary tract: Secondary | ICD-10-CM | POA: Diagnosis not present

## 2020-11-29 DIAGNOSIS — D649 Anemia, unspecified: Secondary | ICD-10-CM | POA: Diagnosis not present

## 2020-11-29 DIAGNOSIS — N390 Urinary tract infection, site not specified: Secondary | ICD-10-CM | POA: Diagnosis not present

## 2020-11-29 DIAGNOSIS — C679 Malignant neoplasm of bladder, unspecified: Secondary | ICD-10-CM | POA: Diagnosis not present

## 2020-12-01 DIAGNOSIS — Z906 Acquired absence of other parts of urinary tract: Secondary | ICD-10-CM | POA: Diagnosis not present

## 2020-12-01 DIAGNOSIS — K7581 Nonalcoholic steatohepatitis (NASH): Secondary | ICD-10-CM | POA: Diagnosis not present

## 2020-12-01 DIAGNOSIS — C679 Malignant neoplasm of bladder, unspecified: Secondary | ICD-10-CM | POA: Diagnosis not present

## 2020-12-01 DIAGNOSIS — D649 Anemia, unspecified: Secondary | ICD-10-CM | POA: Diagnosis not present

## 2020-12-01 DIAGNOSIS — N390 Urinary tract infection, site not specified: Secondary | ICD-10-CM | POA: Diagnosis not present

## 2020-12-01 DIAGNOSIS — C189 Malignant neoplasm of colon, unspecified: Secondary | ICD-10-CM | POA: Diagnosis not present

## 2020-12-04 DIAGNOSIS — C189 Malignant neoplasm of colon, unspecified: Secondary | ICD-10-CM | POA: Diagnosis not present

## 2020-12-04 DIAGNOSIS — Z906 Acquired absence of other parts of urinary tract: Secondary | ICD-10-CM | POA: Diagnosis not present

## 2020-12-04 DIAGNOSIS — K7581 Nonalcoholic steatohepatitis (NASH): Secondary | ICD-10-CM | POA: Diagnosis not present

## 2020-12-04 DIAGNOSIS — N390 Urinary tract infection, site not specified: Secondary | ICD-10-CM | POA: Diagnosis not present

## 2020-12-04 DIAGNOSIS — C679 Malignant neoplasm of bladder, unspecified: Secondary | ICD-10-CM | POA: Diagnosis not present

## 2020-12-04 DIAGNOSIS — D649 Anemia, unspecified: Secondary | ICD-10-CM | POA: Diagnosis not present

## 2020-12-06 DIAGNOSIS — K7581 Nonalcoholic steatohepatitis (NASH): Secondary | ICD-10-CM | POA: Diagnosis not present

## 2020-12-06 DIAGNOSIS — Z906 Acquired absence of other parts of urinary tract: Secondary | ICD-10-CM | POA: Diagnosis not present

## 2020-12-06 DIAGNOSIS — C679 Malignant neoplasm of bladder, unspecified: Secondary | ICD-10-CM | POA: Diagnosis not present

## 2020-12-06 DIAGNOSIS — D649 Anemia, unspecified: Secondary | ICD-10-CM | POA: Diagnosis not present

## 2020-12-06 DIAGNOSIS — N390 Urinary tract infection, site not specified: Secondary | ICD-10-CM | POA: Diagnosis not present

## 2020-12-06 DIAGNOSIS — C189 Malignant neoplasm of colon, unspecified: Secondary | ICD-10-CM | POA: Diagnosis not present

## 2020-12-08 DIAGNOSIS — C189 Malignant neoplasm of colon, unspecified: Secondary | ICD-10-CM | POA: Diagnosis not present

## 2020-12-08 DIAGNOSIS — C679 Malignant neoplasm of bladder, unspecified: Secondary | ICD-10-CM | POA: Diagnosis not present

## 2020-12-08 DIAGNOSIS — Z906 Acquired absence of other parts of urinary tract: Secondary | ICD-10-CM | POA: Diagnosis not present

## 2020-12-08 DIAGNOSIS — N390 Urinary tract infection, site not specified: Secondary | ICD-10-CM | POA: Diagnosis not present

## 2020-12-08 DIAGNOSIS — K7581 Nonalcoholic steatohepatitis (NASH): Secondary | ICD-10-CM | POA: Diagnosis not present

## 2020-12-08 DIAGNOSIS — D649 Anemia, unspecified: Secondary | ICD-10-CM | POA: Diagnosis not present

## 2020-12-09 DIAGNOSIS — D649 Anemia, unspecified: Secondary | ICD-10-CM | POA: Diagnosis not present

## 2020-12-09 DIAGNOSIS — Z906 Acquired absence of other parts of urinary tract: Secondary | ICD-10-CM | POA: Diagnosis not present

## 2020-12-09 DIAGNOSIS — N390 Urinary tract infection, site not specified: Secondary | ICD-10-CM | POA: Diagnosis not present

## 2020-12-09 DIAGNOSIS — C189 Malignant neoplasm of colon, unspecified: Secondary | ICD-10-CM | POA: Diagnosis not present

## 2020-12-09 DIAGNOSIS — C679 Malignant neoplasm of bladder, unspecified: Secondary | ICD-10-CM | POA: Diagnosis not present

## 2020-12-09 DIAGNOSIS — K7581 Nonalcoholic steatohepatitis (NASH): Secondary | ICD-10-CM | POA: Diagnosis not present

## 2020-12-10 DIAGNOSIS — K7581 Nonalcoholic steatohepatitis (NASH): Secondary | ICD-10-CM | POA: Diagnosis not present

## 2020-12-10 DIAGNOSIS — N390 Urinary tract infection, site not specified: Secondary | ICD-10-CM | POA: Diagnosis not present

## 2020-12-10 DIAGNOSIS — C189 Malignant neoplasm of colon, unspecified: Secondary | ICD-10-CM | POA: Diagnosis not present

## 2020-12-10 DIAGNOSIS — D649 Anemia, unspecified: Secondary | ICD-10-CM | POA: Diagnosis not present

## 2020-12-10 DIAGNOSIS — C679 Malignant neoplasm of bladder, unspecified: Secondary | ICD-10-CM | POA: Diagnosis not present

## 2020-12-10 DIAGNOSIS — Z906 Acquired absence of other parts of urinary tract: Secondary | ICD-10-CM | POA: Diagnosis not present

## 2020-12-11 DIAGNOSIS — N390 Urinary tract infection, site not specified: Secondary | ICD-10-CM | POA: Diagnosis not present

## 2020-12-11 DIAGNOSIS — C679 Malignant neoplasm of bladder, unspecified: Secondary | ICD-10-CM | POA: Diagnosis not present

## 2020-12-11 DIAGNOSIS — C189 Malignant neoplasm of colon, unspecified: Secondary | ICD-10-CM | POA: Diagnosis not present

## 2020-12-11 DIAGNOSIS — D649 Anemia, unspecified: Secondary | ICD-10-CM | POA: Diagnosis not present

## 2020-12-11 DIAGNOSIS — K7581 Nonalcoholic steatohepatitis (NASH): Secondary | ICD-10-CM | POA: Diagnosis not present

## 2020-12-11 DIAGNOSIS — Z906 Acquired absence of other parts of urinary tract: Secondary | ICD-10-CM | POA: Diagnosis not present

## 2020-12-13 DIAGNOSIS — K7581 Nonalcoholic steatohepatitis (NASH): Secondary | ICD-10-CM | POA: Diagnosis not present

## 2020-12-13 DIAGNOSIS — N390 Urinary tract infection, site not specified: Secondary | ICD-10-CM | POA: Diagnosis not present

## 2020-12-13 DIAGNOSIS — C679 Malignant neoplasm of bladder, unspecified: Secondary | ICD-10-CM | POA: Diagnosis not present

## 2020-12-13 DIAGNOSIS — D649 Anemia, unspecified: Secondary | ICD-10-CM | POA: Diagnosis not present

## 2020-12-13 DIAGNOSIS — C189 Malignant neoplasm of colon, unspecified: Secondary | ICD-10-CM | POA: Diagnosis not present

## 2020-12-13 DIAGNOSIS — Z906 Acquired absence of other parts of urinary tract: Secondary | ICD-10-CM | POA: Diagnosis not present

## 2020-12-15 DIAGNOSIS — N390 Urinary tract infection, site not specified: Secondary | ICD-10-CM | POA: Diagnosis not present

## 2020-12-15 DIAGNOSIS — K7581 Nonalcoholic steatohepatitis (NASH): Secondary | ICD-10-CM | POA: Diagnosis not present

## 2020-12-15 DIAGNOSIS — Z906 Acquired absence of other parts of urinary tract: Secondary | ICD-10-CM | POA: Diagnosis not present

## 2020-12-15 DIAGNOSIS — D649 Anemia, unspecified: Secondary | ICD-10-CM | POA: Diagnosis not present

## 2020-12-15 DIAGNOSIS — C189 Malignant neoplasm of colon, unspecified: Secondary | ICD-10-CM | POA: Diagnosis not present

## 2020-12-15 DIAGNOSIS — C679 Malignant neoplasm of bladder, unspecified: Secondary | ICD-10-CM | POA: Diagnosis not present

## 2020-12-17 ENCOUNTER — Other Ambulatory Visit: Payer: Self-pay | Admitting: *Deleted

## 2020-12-17 DIAGNOSIS — D649 Anemia, unspecified: Secondary | ICD-10-CM | POA: Diagnosis not present

## 2020-12-17 DIAGNOSIS — N390 Urinary tract infection, site not specified: Secondary | ICD-10-CM | POA: Diagnosis not present

## 2020-12-17 DIAGNOSIS — C189 Malignant neoplasm of colon, unspecified: Secondary | ICD-10-CM | POA: Diagnosis not present

## 2020-12-17 DIAGNOSIS — Z906 Acquired absence of other parts of urinary tract: Secondary | ICD-10-CM | POA: Diagnosis not present

## 2020-12-17 DIAGNOSIS — C679 Malignant neoplasm of bladder, unspecified: Secondary | ICD-10-CM | POA: Diagnosis not present

## 2020-12-17 DIAGNOSIS — K7581 Nonalcoholic steatohepatitis (NASH): Secondary | ICD-10-CM | POA: Diagnosis not present

## 2020-12-17 NOTE — Telephone Encounter (Signed)
Received call from Gevena Cotton with [facility] [phone] telephone.   Requested refill on Ativan. Ok to refill?  Also requested order for Benadryl 5m PO Q6 Hrs PRN~ itching. Ok to order?

## 2020-12-18 DIAGNOSIS — N390 Urinary tract infection, site not specified: Secondary | ICD-10-CM | POA: Diagnosis not present

## 2020-12-18 DIAGNOSIS — D649 Anemia, unspecified: Secondary | ICD-10-CM | POA: Diagnosis not present

## 2020-12-18 DIAGNOSIS — C679 Malignant neoplasm of bladder, unspecified: Secondary | ICD-10-CM | POA: Diagnosis not present

## 2020-12-18 DIAGNOSIS — C189 Malignant neoplasm of colon, unspecified: Secondary | ICD-10-CM | POA: Diagnosis not present

## 2020-12-18 DIAGNOSIS — Z906 Acquired absence of other parts of urinary tract: Secondary | ICD-10-CM | POA: Diagnosis not present

## 2020-12-18 DIAGNOSIS — K7581 Nonalcoholic steatohepatitis (NASH): Secondary | ICD-10-CM | POA: Diagnosis not present

## 2020-12-18 MED ORDER — LORAZEPAM 1 MG PO TABS: 1.0000 mg | ORAL_TABLET | Freq: Three times a day (TID) | ORAL | 0 refills | Status: AC | PRN

## 2020-12-21 DIAGNOSIS — M1990 Unspecified osteoarthritis, unspecified site: Secondary | ICD-10-CM | POA: Diagnosis not present

## 2020-12-21 DIAGNOSIS — Z906 Acquired absence of other parts of urinary tract: Secondary | ICD-10-CM | POA: Diagnosis not present

## 2020-12-21 DIAGNOSIS — R52 Pain, unspecified: Secondary | ICD-10-CM | POA: Diagnosis not present

## 2020-12-21 DIAGNOSIS — F0280 Dementia in other diseases classified elsewhere without behavioral disturbance: Secondary | ICD-10-CM | POA: Diagnosis not present

## 2020-12-21 DIAGNOSIS — K219 Gastro-esophageal reflux disease without esophagitis: Secondary | ICD-10-CM | POA: Diagnosis not present

## 2020-12-21 DIAGNOSIS — K7581 Nonalcoholic steatohepatitis (NASH): Secondary | ICD-10-CM | POA: Diagnosis not present

## 2020-12-21 DIAGNOSIS — F331 Major depressive disorder, recurrent, moderate: Secondary | ICD-10-CM | POA: Diagnosis not present

## 2020-12-21 DIAGNOSIS — C679 Malignant neoplasm of bladder, unspecified: Secondary | ICD-10-CM | POA: Diagnosis not present

## 2020-12-21 DIAGNOSIS — I8501 Esophageal varices with bleeding: Secondary | ICD-10-CM | POA: Diagnosis not present

## 2020-12-21 DIAGNOSIS — C189 Malignant neoplasm of colon, unspecified: Secondary | ICD-10-CM | POA: Diagnosis not present

## 2020-12-21 DIAGNOSIS — R531 Weakness: Secondary | ICD-10-CM | POA: Diagnosis not present

## 2020-12-21 DIAGNOSIS — D649 Anemia, unspecified: Secondary | ICD-10-CM | POA: Diagnosis not present

## 2020-12-21 DIAGNOSIS — N390 Urinary tract infection, site not specified: Secondary | ICD-10-CM | POA: Diagnosis not present

## 2020-12-22 DIAGNOSIS — C679 Malignant neoplasm of bladder, unspecified: Secondary | ICD-10-CM | POA: Diagnosis not present

## 2020-12-22 DIAGNOSIS — K7581 Nonalcoholic steatohepatitis (NASH): Secondary | ICD-10-CM | POA: Diagnosis not present

## 2020-12-22 DIAGNOSIS — Z906 Acquired absence of other parts of urinary tract: Secondary | ICD-10-CM | POA: Diagnosis not present

## 2020-12-22 DIAGNOSIS — C189 Malignant neoplasm of colon, unspecified: Secondary | ICD-10-CM | POA: Diagnosis not present

## 2020-12-22 DIAGNOSIS — D649 Anemia, unspecified: Secondary | ICD-10-CM | POA: Diagnosis not present

## 2020-12-22 DIAGNOSIS — N390 Urinary tract infection, site not specified: Secondary | ICD-10-CM | POA: Diagnosis not present

## 2020-12-23 DIAGNOSIS — K7581 Nonalcoholic steatohepatitis (NASH): Secondary | ICD-10-CM | POA: Diagnosis not present

## 2020-12-23 DIAGNOSIS — D649 Anemia, unspecified: Secondary | ICD-10-CM | POA: Diagnosis not present

## 2020-12-23 DIAGNOSIS — C189 Malignant neoplasm of colon, unspecified: Secondary | ICD-10-CM | POA: Diagnosis not present

## 2020-12-23 DIAGNOSIS — N390 Urinary tract infection, site not specified: Secondary | ICD-10-CM | POA: Diagnosis not present

## 2020-12-23 DIAGNOSIS — C679 Malignant neoplasm of bladder, unspecified: Secondary | ICD-10-CM | POA: Diagnosis not present

## 2020-12-23 DIAGNOSIS — Z906 Acquired absence of other parts of urinary tract: Secondary | ICD-10-CM | POA: Diagnosis not present

## 2020-12-24 ENCOUNTER — Telehealth: Payer: Self-pay | Admitting: *Deleted

## 2020-12-24 DIAGNOSIS — C679 Malignant neoplasm of bladder, unspecified: Secondary | ICD-10-CM | POA: Diagnosis not present

## 2020-12-24 DIAGNOSIS — K7581 Nonalcoholic steatohepatitis (NASH): Secondary | ICD-10-CM | POA: Diagnosis not present

## 2020-12-24 DIAGNOSIS — D649 Anemia, unspecified: Secondary | ICD-10-CM | POA: Diagnosis not present

## 2020-12-24 DIAGNOSIS — C189 Malignant neoplasm of colon, unspecified: Secondary | ICD-10-CM | POA: Diagnosis not present

## 2020-12-24 DIAGNOSIS — Z906 Acquired absence of other parts of urinary tract: Secondary | ICD-10-CM | POA: Diagnosis not present

## 2020-12-24 DIAGNOSIS — N390 Urinary tract infection, site not specified: Secondary | ICD-10-CM | POA: Diagnosis not present

## 2020-12-24 NOTE — Telephone Encounter (Signed)
Received call from Gevena Cotton with [facility].   Reports that patient has transferred to [facility] for in patient respite care x5 days.   States that patient may also require transition to end of life care, but she is not at that transition point at this time.   Will continue to monitor and keep PCP in the loop.

## 2020-12-25 DIAGNOSIS — C189 Malignant neoplasm of colon, unspecified: Secondary | ICD-10-CM | POA: Diagnosis not present

## 2020-12-25 DIAGNOSIS — D649 Anemia, unspecified: Secondary | ICD-10-CM | POA: Diagnosis not present

## 2020-12-25 DIAGNOSIS — K7581 Nonalcoholic steatohepatitis (NASH): Secondary | ICD-10-CM | POA: Diagnosis not present

## 2020-12-25 DIAGNOSIS — Z906 Acquired absence of other parts of urinary tract: Secondary | ICD-10-CM | POA: Diagnosis not present

## 2020-12-25 DIAGNOSIS — N390 Urinary tract infection, site not specified: Secondary | ICD-10-CM | POA: Diagnosis not present

## 2020-12-25 DIAGNOSIS — C679 Malignant neoplasm of bladder, unspecified: Secondary | ICD-10-CM | POA: Diagnosis not present

## 2020-12-26 DIAGNOSIS — Z906 Acquired absence of other parts of urinary tract: Secondary | ICD-10-CM | POA: Diagnosis not present

## 2020-12-26 DIAGNOSIS — D649 Anemia, unspecified: Secondary | ICD-10-CM | POA: Diagnosis not present

## 2020-12-26 DIAGNOSIS — N390 Urinary tract infection, site not specified: Secondary | ICD-10-CM | POA: Diagnosis not present

## 2020-12-26 DIAGNOSIS — K7581 Nonalcoholic steatohepatitis (NASH): Secondary | ICD-10-CM | POA: Diagnosis not present

## 2020-12-26 DIAGNOSIS — C679 Malignant neoplasm of bladder, unspecified: Secondary | ICD-10-CM | POA: Diagnosis not present

## 2020-12-26 DIAGNOSIS — C189 Malignant neoplasm of colon, unspecified: Secondary | ICD-10-CM | POA: Diagnosis not present

## 2020-12-27 DIAGNOSIS — Z906 Acquired absence of other parts of urinary tract: Secondary | ICD-10-CM | POA: Diagnosis not present

## 2020-12-27 DIAGNOSIS — C189 Malignant neoplasm of colon, unspecified: Secondary | ICD-10-CM | POA: Diagnosis not present

## 2020-12-27 DIAGNOSIS — C679 Malignant neoplasm of bladder, unspecified: Secondary | ICD-10-CM | POA: Diagnosis not present

## 2020-12-27 DIAGNOSIS — K7581 Nonalcoholic steatohepatitis (NASH): Secondary | ICD-10-CM | POA: Diagnosis not present

## 2020-12-27 DIAGNOSIS — D649 Anemia, unspecified: Secondary | ICD-10-CM | POA: Diagnosis not present

## 2020-12-27 DIAGNOSIS — N390 Urinary tract infection, site not specified: Secondary | ICD-10-CM | POA: Diagnosis not present

## 2020-12-28 DIAGNOSIS — D649 Anemia, unspecified: Secondary | ICD-10-CM | POA: Diagnosis not present

## 2020-12-28 DIAGNOSIS — Z906 Acquired absence of other parts of urinary tract: Secondary | ICD-10-CM | POA: Diagnosis not present

## 2020-12-28 DIAGNOSIS — N390 Urinary tract infection, site not specified: Secondary | ICD-10-CM | POA: Diagnosis not present

## 2020-12-28 DIAGNOSIS — C189 Malignant neoplasm of colon, unspecified: Secondary | ICD-10-CM | POA: Diagnosis not present

## 2020-12-28 DIAGNOSIS — C679 Malignant neoplasm of bladder, unspecified: Secondary | ICD-10-CM | POA: Diagnosis not present

## 2020-12-28 DIAGNOSIS — K7581 Nonalcoholic steatohepatitis (NASH): Secondary | ICD-10-CM | POA: Diagnosis not present

## 2020-12-29 DIAGNOSIS — C189 Malignant neoplasm of colon, unspecified: Secondary | ICD-10-CM | POA: Diagnosis not present

## 2020-12-29 DIAGNOSIS — Z906 Acquired absence of other parts of urinary tract: Secondary | ICD-10-CM | POA: Diagnosis not present

## 2020-12-29 DIAGNOSIS — K7581 Nonalcoholic steatohepatitis (NASH): Secondary | ICD-10-CM | POA: Diagnosis not present

## 2020-12-29 DIAGNOSIS — D649 Anemia, unspecified: Secondary | ICD-10-CM | POA: Diagnosis not present

## 2020-12-29 DIAGNOSIS — N390 Urinary tract infection, site not specified: Secondary | ICD-10-CM | POA: Diagnosis not present

## 2020-12-29 DIAGNOSIS — C679 Malignant neoplasm of bladder, unspecified: Secondary | ICD-10-CM | POA: Diagnosis not present

## 2020-12-30 DIAGNOSIS — C679 Malignant neoplasm of bladder, unspecified: Secondary | ICD-10-CM | POA: Diagnosis not present

## 2020-12-30 DIAGNOSIS — C189 Malignant neoplasm of colon, unspecified: Secondary | ICD-10-CM | POA: Diagnosis not present

## 2020-12-30 DIAGNOSIS — K7581 Nonalcoholic steatohepatitis (NASH): Secondary | ICD-10-CM | POA: Diagnosis not present

## 2020-12-30 DIAGNOSIS — N390 Urinary tract infection, site not specified: Secondary | ICD-10-CM | POA: Diagnosis not present

## 2020-12-30 DIAGNOSIS — D649 Anemia, unspecified: Secondary | ICD-10-CM | POA: Diagnosis not present

## 2020-12-30 DIAGNOSIS — Z906 Acquired absence of other parts of urinary tract: Secondary | ICD-10-CM | POA: Diagnosis not present

## 2020-12-31 DIAGNOSIS — N390 Urinary tract infection, site not specified: Secondary | ICD-10-CM | POA: Diagnosis not present

## 2020-12-31 DIAGNOSIS — D649 Anemia, unspecified: Secondary | ICD-10-CM | POA: Diagnosis not present

## 2020-12-31 DIAGNOSIS — K7581 Nonalcoholic steatohepatitis (NASH): Secondary | ICD-10-CM | POA: Diagnosis not present

## 2020-12-31 DIAGNOSIS — C679 Malignant neoplasm of bladder, unspecified: Secondary | ICD-10-CM | POA: Diagnosis not present

## 2020-12-31 DIAGNOSIS — Z906 Acquired absence of other parts of urinary tract: Secondary | ICD-10-CM | POA: Diagnosis not present

## 2020-12-31 DIAGNOSIS — C189 Malignant neoplasm of colon, unspecified: Secondary | ICD-10-CM | POA: Diagnosis not present

## 2021-01-01 DIAGNOSIS — Z906 Acquired absence of other parts of urinary tract: Secondary | ICD-10-CM | POA: Diagnosis not present

## 2021-01-01 DIAGNOSIS — C189 Malignant neoplasm of colon, unspecified: Secondary | ICD-10-CM | POA: Diagnosis not present

## 2021-01-01 DIAGNOSIS — N390 Urinary tract infection, site not specified: Secondary | ICD-10-CM | POA: Diagnosis not present

## 2021-01-01 DIAGNOSIS — K7581 Nonalcoholic steatohepatitis (NASH): Secondary | ICD-10-CM | POA: Diagnosis not present

## 2021-01-01 DIAGNOSIS — D649 Anemia, unspecified: Secondary | ICD-10-CM | POA: Diagnosis not present

## 2021-01-01 DIAGNOSIS — C679 Malignant neoplasm of bladder, unspecified: Secondary | ICD-10-CM | POA: Diagnosis not present

## 2021-01-02 DIAGNOSIS — K7581 Nonalcoholic steatohepatitis (NASH): Secondary | ICD-10-CM | POA: Diagnosis not present

## 2021-01-02 DIAGNOSIS — Z906 Acquired absence of other parts of urinary tract: Secondary | ICD-10-CM | POA: Diagnosis not present

## 2021-01-02 DIAGNOSIS — C189 Malignant neoplasm of colon, unspecified: Secondary | ICD-10-CM | POA: Diagnosis not present

## 2021-01-02 DIAGNOSIS — C679 Malignant neoplasm of bladder, unspecified: Secondary | ICD-10-CM | POA: Diagnosis not present

## 2021-01-02 DIAGNOSIS — D649 Anemia, unspecified: Secondary | ICD-10-CM | POA: Diagnosis not present

## 2021-01-02 DIAGNOSIS — N390 Urinary tract infection, site not specified: Secondary | ICD-10-CM | POA: Diagnosis not present

## 2021-01-03 DIAGNOSIS — D649 Anemia, unspecified: Secondary | ICD-10-CM | POA: Diagnosis not present

## 2021-01-03 DIAGNOSIS — K7581 Nonalcoholic steatohepatitis (NASH): Secondary | ICD-10-CM | POA: Diagnosis not present

## 2021-01-03 DIAGNOSIS — N390 Urinary tract infection, site not specified: Secondary | ICD-10-CM | POA: Diagnosis not present

## 2021-01-03 DIAGNOSIS — Z906 Acquired absence of other parts of urinary tract: Secondary | ICD-10-CM | POA: Diagnosis not present

## 2021-01-03 DIAGNOSIS — C679 Malignant neoplasm of bladder, unspecified: Secondary | ICD-10-CM | POA: Diagnosis not present

## 2021-01-03 DIAGNOSIS — C189 Malignant neoplasm of colon, unspecified: Secondary | ICD-10-CM | POA: Diagnosis not present

## 2021-01-04 DIAGNOSIS — Z906 Acquired absence of other parts of urinary tract: Secondary | ICD-10-CM | POA: Diagnosis not present

## 2021-01-04 DIAGNOSIS — C189 Malignant neoplasm of colon, unspecified: Secondary | ICD-10-CM | POA: Diagnosis not present

## 2021-01-04 DIAGNOSIS — K7581 Nonalcoholic steatohepatitis (NASH): Secondary | ICD-10-CM | POA: Diagnosis not present

## 2021-01-04 DIAGNOSIS — D649 Anemia, unspecified: Secondary | ICD-10-CM | POA: Diagnosis not present

## 2021-01-04 DIAGNOSIS — C679 Malignant neoplasm of bladder, unspecified: Secondary | ICD-10-CM | POA: Diagnosis not present

## 2021-01-04 DIAGNOSIS — N390 Urinary tract infection, site not specified: Secondary | ICD-10-CM | POA: Diagnosis not present

## 2021-01-05 DIAGNOSIS — C679 Malignant neoplasm of bladder, unspecified: Secondary | ICD-10-CM | POA: Diagnosis not present

## 2021-01-05 DIAGNOSIS — Z906 Acquired absence of other parts of urinary tract: Secondary | ICD-10-CM | POA: Diagnosis not present

## 2021-01-05 DIAGNOSIS — C189 Malignant neoplasm of colon, unspecified: Secondary | ICD-10-CM | POA: Diagnosis not present

## 2021-01-05 DIAGNOSIS — D649 Anemia, unspecified: Secondary | ICD-10-CM | POA: Diagnosis not present

## 2021-01-05 DIAGNOSIS — N390 Urinary tract infection, site not specified: Secondary | ICD-10-CM | POA: Diagnosis not present

## 2021-01-05 DIAGNOSIS — K7581 Nonalcoholic steatohepatitis (NASH): Secondary | ICD-10-CM | POA: Diagnosis not present

## 2021-01-06 DIAGNOSIS — C189 Malignant neoplasm of colon, unspecified: Secondary | ICD-10-CM | POA: Diagnosis not present

## 2021-01-06 DIAGNOSIS — Z906 Acquired absence of other parts of urinary tract: Secondary | ICD-10-CM | POA: Diagnosis not present

## 2021-01-06 DIAGNOSIS — K7581 Nonalcoholic steatohepatitis (NASH): Secondary | ICD-10-CM | POA: Diagnosis not present

## 2021-01-06 DIAGNOSIS — D649 Anemia, unspecified: Secondary | ICD-10-CM | POA: Diagnosis not present

## 2021-01-06 DIAGNOSIS — N390 Urinary tract infection, site not specified: Secondary | ICD-10-CM | POA: Diagnosis not present

## 2021-01-06 DIAGNOSIS — C679 Malignant neoplasm of bladder, unspecified: Secondary | ICD-10-CM | POA: Diagnosis not present

## 2021-01-07 DIAGNOSIS — D649 Anemia, unspecified: Secondary | ICD-10-CM | POA: Diagnosis not present

## 2021-01-07 DIAGNOSIS — C189 Malignant neoplasm of colon, unspecified: Secondary | ICD-10-CM | POA: Diagnosis not present

## 2021-01-07 DIAGNOSIS — K7581 Nonalcoholic steatohepatitis (NASH): Secondary | ICD-10-CM | POA: Diagnosis not present

## 2021-01-07 DIAGNOSIS — N390 Urinary tract infection, site not specified: Secondary | ICD-10-CM | POA: Diagnosis not present

## 2021-01-07 DIAGNOSIS — C679 Malignant neoplasm of bladder, unspecified: Secondary | ICD-10-CM | POA: Diagnosis not present

## 2021-01-07 DIAGNOSIS — Z906 Acquired absence of other parts of urinary tract: Secondary | ICD-10-CM | POA: Diagnosis not present

## 2021-01-08 DIAGNOSIS — D649 Anemia, unspecified: Secondary | ICD-10-CM | POA: Diagnosis not present

## 2021-01-08 DIAGNOSIS — C679 Malignant neoplasm of bladder, unspecified: Secondary | ICD-10-CM | POA: Diagnosis not present

## 2021-01-08 DIAGNOSIS — N390 Urinary tract infection, site not specified: Secondary | ICD-10-CM | POA: Diagnosis not present

## 2021-01-08 DIAGNOSIS — C189 Malignant neoplasm of colon, unspecified: Secondary | ICD-10-CM | POA: Diagnosis not present

## 2021-01-08 DIAGNOSIS — Z906 Acquired absence of other parts of urinary tract: Secondary | ICD-10-CM | POA: Diagnosis not present

## 2021-01-08 DIAGNOSIS — K7581 Nonalcoholic steatohepatitis (NASH): Secondary | ICD-10-CM | POA: Diagnosis not present

## 2021-01-09 DIAGNOSIS — D649 Anemia, unspecified: Secondary | ICD-10-CM | POA: Diagnosis not present

## 2021-01-09 DIAGNOSIS — C679 Malignant neoplasm of bladder, unspecified: Secondary | ICD-10-CM | POA: Diagnosis not present

## 2021-01-09 DIAGNOSIS — Z906 Acquired absence of other parts of urinary tract: Secondary | ICD-10-CM | POA: Diagnosis not present

## 2021-01-09 DIAGNOSIS — K7581 Nonalcoholic steatohepatitis (NASH): Secondary | ICD-10-CM | POA: Diagnosis not present

## 2021-01-09 DIAGNOSIS — C189 Malignant neoplasm of colon, unspecified: Secondary | ICD-10-CM | POA: Diagnosis not present

## 2021-01-09 DIAGNOSIS — N390 Urinary tract infection, site not specified: Secondary | ICD-10-CM | POA: Diagnosis not present

## 2021-01-10 DIAGNOSIS — Z906 Acquired absence of other parts of urinary tract: Secondary | ICD-10-CM | POA: Diagnosis not present

## 2021-01-10 DIAGNOSIS — C679 Malignant neoplasm of bladder, unspecified: Secondary | ICD-10-CM | POA: Diagnosis not present

## 2021-01-10 DIAGNOSIS — D649 Anemia, unspecified: Secondary | ICD-10-CM | POA: Diagnosis not present

## 2021-01-10 DIAGNOSIS — K7581 Nonalcoholic steatohepatitis (NASH): Secondary | ICD-10-CM | POA: Diagnosis not present

## 2021-01-10 DIAGNOSIS — C189 Malignant neoplasm of colon, unspecified: Secondary | ICD-10-CM | POA: Diagnosis not present

## 2021-01-10 DIAGNOSIS — N390 Urinary tract infection, site not specified: Secondary | ICD-10-CM | POA: Diagnosis not present

## 2021-01-11 DIAGNOSIS — K7581 Nonalcoholic steatohepatitis (NASH): Secondary | ICD-10-CM | POA: Diagnosis not present

## 2021-01-11 DIAGNOSIS — C679 Malignant neoplasm of bladder, unspecified: Secondary | ICD-10-CM | POA: Diagnosis not present

## 2021-01-11 DIAGNOSIS — Z906 Acquired absence of other parts of urinary tract: Secondary | ICD-10-CM | POA: Diagnosis not present

## 2021-01-11 DIAGNOSIS — N390 Urinary tract infection, site not specified: Secondary | ICD-10-CM | POA: Diagnosis not present

## 2021-01-11 DIAGNOSIS — C189 Malignant neoplasm of colon, unspecified: Secondary | ICD-10-CM | POA: Diagnosis not present

## 2021-01-11 DIAGNOSIS — D649 Anemia, unspecified: Secondary | ICD-10-CM | POA: Diagnosis not present

## 2021-01-12 DIAGNOSIS — D649 Anemia, unspecified: Secondary | ICD-10-CM | POA: Diagnosis not present

## 2021-01-12 DIAGNOSIS — C189 Malignant neoplasm of colon, unspecified: Secondary | ICD-10-CM | POA: Diagnosis not present

## 2021-01-12 DIAGNOSIS — Z906 Acquired absence of other parts of urinary tract: Secondary | ICD-10-CM | POA: Diagnosis not present

## 2021-01-12 DIAGNOSIS — C679 Malignant neoplasm of bladder, unspecified: Secondary | ICD-10-CM | POA: Diagnosis not present

## 2021-01-12 DIAGNOSIS — N390 Urinary tract infection, site not specified: Secondary | ICD-10-CM | POA: Diagnosis not present

## 2021-01-12 DIAGNOSIS — K7581 Nonalcoholic steatohepatitis (NASH): Secondary | ICD-10-CM | POA: Diagnosis not present

## 2021-01-13 DIAGNOSIS — K7581 Nonalcoholic steatohepatitis (NASH): Secondary | ICD-10-CM | POA: Diagnosis not present

## 2021-01-13 DIAGNOSIS — D649 Anemia, unspecified: Secondary | ICD-10-CM | POA: Diagnosis not present

## 2021-01-13 DIAGNOSIS — Z906 Acquired absence of other parts of urinary tract: Secondary | ICD-10-CM | POA: Diagnosis not present

## 2021-01-13 DIAGNOSIS — C189 Malignant neoplasm of colon, unspecified: Secondary | ICD-10-CM | POA: Diagnosis not present

## 2021-01-13 DIAGNOSIS — N390 Urinary tract infection, site not specified: Secondary | ICD-10-CM | POA: Diagnosis not present

## 2021-01-13 DIAGNOSIS — C679 Malignant neoplasm of bladder, unspecified: Secondary | ICD-10-CM | POA: Diagnosis not present

## 2021-01-20 DEATH — deceased

## 2021-09-11 ENCOUNTER — Telehealth: Payer: Self-pay | Admitting: Family Medicine

## 2021-09-11 NOTE — Telephone Encounter (Signed)
Left message for patient to call back and schedule Medicare Annual Wellness Visit (AWV) in office.   If not able to come in office, please offer to do virtually or by telephone.  Left office number and my jabber [phone].  Last AWV:07/31/2020  Please schedule at anytime with Nurse Health Advisor.

## 2021-10-02 ENCOUNTER — Telehealth: Payer: Self-pay | Admitting: Family Medicine

## 2021-10-02 NOTE — Telephone Encounter (Signed)
I called patient to schedule AWV.  I spoke to patient's husband.  Patient passed away on 02-10-21. Please mark patient as deceased.
